# Patient Record
Sex: Male | Born: 1935 | Race: White | Hispanic: No | Smoking: Former smoker
Health system: Southern US, Community
[De-identification: ages and names within clinical notes are randomized; demographics above are authoritative.]

## PROBLEM LIST (undated history)

## (undated) DIAGNOSIS — Z0000 Encounter for general adult medical examination without abnormal findings: Secondary | ICD-10-CM

## (undated) DIAGNOSIS — I6350 Cerebral infarction due to unspecified occlusion or stenosis of unspecified cerebral artery: Secondary | ICD-10-CM

## (undated) DIAGNOSIS — E875 Hyperkalemia: Secondary | ICD-10-CM

## (undated) DIAGNOSIS — J45909 Unspecified asthma, uncomplicated: Secondary | ICD-10-CM

## (undated) DIAGNOSIS — I10 Essential (primary) hypertension: Secondary | ICD-10-CM

## (undated) DIAGNOSIS — K5900 Constipation, unspecified: Secondary | ICD-10-CM

## (undated) DIAGNOSIS — Z9189 Other specified personal risk factors, not elsewhere classified: Secondary | ICD-10-CM

## (undated) DIAGNOSIS — T7840XA Allergy, unspecified, initial encounter: Secondary | ICD-10-CM

## (undated) DIAGNOSIS — Z8619 Personal history of other infectious and parasitic diseases: Secondary | ICD-10-CM

## (undated) DIAGNOSIS — D126 Benign neoplasm of colon, unspecified: Secondary | ICD-10-CM

## (undated) DIAGNOSIS — R040 Epistaxis: Secondary | ICD-10-CM

## (undated) DIAGNOSIS — Z87891 Personal history of nicotine dependence: Secondary | ICD-10-CM

## (undated) DIAGNOSIS — E039 Hypothyroidism, unspecified: Secondary | ICD-10-CM

## (undated) DIAGNOSIS — E782 Mixed hyperlipidemia: Secondary | ICD-10-CM

## (undated) DIAGNOSIS — R5383 Other fatigue: Secondary | ICD-10-CM

## (undated) DIAGNOSIS — C4491 Basal cell carcinoma of skin, unspecified: Secondary | ICD-10-CM

## (undated) DIAGNOSIS — E663 Overweight: Secondary | ICD-10-CM

## (undated) DIAGNOSIS — E785 Hyperlipidemia, unspecified: Secondary | ICD-10-CM

## (undated) DIAGNOSIS — L989 Disorder of the skin and subcutaneous tissue, unspecified: Secondary | ICD-10-CM

## (undated) DIAGNOSIS — G629 Polyneuropathy, unspecified: Secondary | ICD-10-CM

## (undated) DIAGNOSIS — D696 Thrombocytopenia, unspecified: Secondary | ICD-10-CM

## (undated) DIAGNOSIS — D649 Anemia, unspecified: Secondary | ICD-10-CM

## (undated) DIAGNOSIS — K1370 Unspecified lesions of oral mucosa: Secondary | ICD-10-CM

## (undated) DIAGNOSIS — G4730 Sleep apnea, unspecified: Secondary | ICD-10-CM

## (undated) DIAGNOSIS — I63239 Cerebral infarction due to unspecified occlusion or stenosis of unspecified carotid arteries: Secondary | ICD-10-CM

## (undated) DIAGNOSIS — R5381 Other malaise: Secondary | ICD-10-CM

## (undated) HISTORY — DX: Unspecified asthma, uncomplicated: J45.909

## (undated) HISTORY — DX: Hyperlipidemia, unspecified: E78.5

## (undated) HISTORY — DX: Other specified personal risk factors, not elsewhere classified: Z91.89

## (undated) HISTORY — DX: Benign neoplasm of colon, unspecified: D12.6

## (undated) HISTORY — DX: Personal history of other infectious and parasitic diseases: Z86.19

## (undated) HISTORY — DX: Essential (primary) hypertension: I10

## (undated) HISTORY — DX: Hyperkalemia: E87.5

## (undated) HISTORY — DX: Other malaise: R53.81

## (undated) HISTORY — PX: CATARACT EXTRACTION, BILATERAL: SHX1313

## (undated) HISTORY — DX: Hypothyroidism, unspecified: E03.9

## (undated) HISTORY — DX: Overweight: E66.3

## (undated) HISTORY — DX: Cerebral infarction due to unspecified occlusion or stenosis of unspecified cerebral artery: I63.50

## (undated) HISTORY — DX: Polyneuropathy, unspecified: G62.9

## (undated) HISTORY — PX: TONSILLECTOMY: SUR1361

## (undated) HISTORY — DX: Sleep apnea, unspecified: G47.30

## (undated) HISTORY — DX: Anemia, unspecified: D64.9

## (undated) HISTORY — PX: ROTATOR CUFF REPAIR: SHX139

## (undated) HISTORY — DX: Disorder of the skin and subcutaneous tissue, unspecified: L98.9

## (undated) HISTORY — DX: Mixed hyperlipidemia: E78.2

## (undated) HISTORY — DX: Personal history of nicotine dependence: Z87.891

## (undated) HISTORY — DX: Basal cell carcinoma of skin, unspecified: C44.91

## (undated) HISTORY — DX: Other fatigue: R53.83

## (undated) HISTORY — DX: Epistaxis: R04.0

## (undated) HISTORY — PX: OTHER SURGICAL HISTORY: SHX169

## (undated) HISTORY — DX: Encounter for general adult medical examination without abnormal findings: Z00.00

## (undated) HISTORY — DX: Unspecified lesions of oral mucosa: K13.70

## (undated) HISTORY — DX: Constipation, unspecified: K59.00

## (undated) HISTORY — DX: Allergy, unspecified, initial encounter: T78.40XA

## (undated) HISTORY — DX: Cerebral infarction due to unspecified occlusion or stenosis of unspecified carotid artery: I63.239

## (undated) HISTORY — DX: Thrombocytopenia, unspecified: D69.6

---

## 1966-07-20 HISTORY — PX: PILONIDAL CYST EXCISION: SHX744

## 2001-05-26 ENCOUNTER — Encounter: Payer: Self-pay | Admitting: Emergency Medicine

## 2001-05-26 ENCOUNTER — Emergency Department (HOSPITAL_COMMUNITY): Admission: EM | Admit: 2001-05-26 | Discharge: 2001-05-26 | Payer: Self-pay | Admitting: Emergency Medicine

## 2007-05-04 ENCOUNTER — Ambulatory Visit: Payer: Self-pay | Admitting: Vascular Surgery

## 2008-02-08 ENCOUNTER — Encounter: Admission: RE | Admit: 2008-02-08 | Discharge: 2008-02-08 | Payer: Self-pay | Admitting: Specialist

## 2008-02-10 ENCOUNTER — Ambulatory Visit (HOSPITAL_BASED_OUTPATIENT_CLINIC_OR_DEPARTMENT_OTHER): Admission: RE | Admit: 2008-02-10 | Discharge: 2008-02-10 | Payer: Self-pay | Admitting: Specialist

## 2010-03-10 ENCOUNTER — Inpatient Hospital Stay (HOSPITAL_COMMUNITY): Admission: EM | Admit: 2010-03-10 | Discharge: 2010-03-12 | Payer: Self-pay | Admitting: Neurology

## 2010-03-10 ENCOUNTER — Encounter: Payer: Self-pay | Admitting: Family Medicine

## 2010-03-10 ENCOUNTER — Encounter (INDEPENDENT_AMBULATORY_CARE_PROVIDER_SITE_OTHER): Payer: Self-pay | Admitting: Neurology

## 2010-03-11 ENCOUNTER — Encounter (INDEPENDENT_AMBULATORY_CARE_PROVIDER_SITE_OTHER): Payer: Self-pay | Admitting: Neurology

## 2010-03-11 ENCOUNTER — Encounter: Payer: Self-pay | Admitting: Family Medicine

## 2010-03-11 ENCOUNTER — Telehealth: Payer: Self-pay | Admitting: Family Medicine

## 2010-03-17 ENCOUNTER — Ambulatory Visit: Payer: Self-pay | Admitting: Family Medicine

## 2010-03-17 DIAGNOSIS — Z87891 Personal history of nicotine dependence: Secondary | ICD-10-CM

## 2010-03-17 DIAGNOSIS — I6350 Cerebral infarction due to unspecified occlusion or stenosis of unspecified cerebral artery: Secondary | ICD-10-CM

## 2010-03-17 DIAGNOSIS — Z8619 Personal history of other infectious and parasitic diseases: Secondary | ICD-10-CM

## 2010-03-17 DIAGNOSIS — J45909 Unspecified asthma, uncomplicated: Secondary | ICD-10-CM | POA: Insufficient documentation

## 2010-03-17 DIAGNOSIS — R5381 Other malaise: Secondary | ICD-10-CM

## 2010-03-17 DIAGNOSIS — Z9189 Other specified personal risk factors, not elsewhere classified: Secondary | ICD-10-CM | POA: Insufficient documentation

## 2010-03-17 DIAGNOSIS — J309 Allergic rhinitis, unspecified: Secondary | ICD-10-CM | POA: Insufficient documentation

## 2010-03-17 DIAGNOSIS — I63239 Cerebral infarction due to unspecified occlusion or stenosis of unspecified carotid arteries: Secondary | ICD-10-CM

## 2010-03-17 DIAGNOSIS — Z8673 Personal history of transient ischemic attack (TIA), and cerebral infarction without residual deficits: Secondary | ICD-10-CM | POA: Insufficient documentation

## 2010-03-17 DIAGNOSIS — I10 Essential (primary) hypertension: Secondary | ICD-10-CM

## 2010-03-17 DIAGNOSIS — E782 Mixed hyperlipidemia: Secondary | ICD-10-CM

## 2010-03-17 DIAGNOSIS — R5383 Other fatigue: Secondary | ICD-10-CM | POA: Insufficient documentation

## 2010-03-17 HISTORY — DX: Other malaise: R53.81

## 2010-03-17 HISTORY — DX: Personal history of other infectious and parasitic diseases: Z86.19

## 2010-03-17 HISTORY — DX: Personal history of nicotine dependence: Z87.891

## 2010-03-17 HISTORY — DX: Cerebral infarction due to unspecified occlusion or stenosis of unspecified cerebral artery: I63.50

## 2010-03-17 HISTORY — DX: Unspecified asthma, uncomplicated: J45.909

## 2010-03-17 HISTORY — DX: Mixed hyperlipidemia: E78.2

## 2010-03-17 HISTORY — DX: Cerebral infarction due to unspecified occlusion or stenosis of unspecified carotid artery: I63.239

## 2010-03-17 HISTORY — DX: Other specified personal risk factors, not elsewhere classified: Z91.89

## 2010-03-19 ENCOUNTER — Telehealth (INDEPENDENT_AMBULATORY_CARE_PROVIDER_SITE_OTHER): Payer: Self-pay | Admitting: *Deleted

## 2010-03-20 ENCOUNTER — Telehealth (INDEPENDENT_AMBULATORY_CARE_PROVIDER_SITE_OTHER): Payer: Self-pay | Admitting: *Deleted

## 2010-03-21 ENCOUNTER — Telehealth (INDEPENDENT_AMBULATORY_CARE_PROVIDER_SITE_OTHER): Payer: Self-pay | Admitting: *Deleted

## 2010-03-25 ENCOUNTER — Ambulatory Visit: Payer: Self-pay | Admitting: Family Medicine

## 2010-04-07 ENCOUNTER — Ambulatory Visit: Payer: Self-pay | Admitting: Family Medicine

## 2010-04-08 LAB — CONVERTED CEMR LAB
GFR calc non Af Amer: 62.44 mL/min (ref >60)
Potassium: 4.9 meq/L (ref 3.5–5.1)
Sodium: 139 meq/L (ref 135–145)

## 2010-04-14 ENCOUNTER — Ambulatory Visit: Payer: Self-pay | Admitting: Family Medicine

## 2010-05-12 ENCOUNTER — Encounter: Payer: Self-pay | Admitting: Family Medicine

## 2010-05-12 ENCOUNTER — Telehealth: Payer: Self-pay | Admitting: Family Medicine

## 2010-05-20 ENCOUNTER — Ambulatory Visit: Payer: Self-pay | Admitting: Family Medicine

## 2010-05-21 LAB — CONVERTED CEMR LAB
ALT: 18 units/L (ref 0–53)
AST: 21 units/L (ref 0–37)
Basophils Absolute: 0.1 10*3/uL (ref 0.0–0.1)
Calcium: 10.1 mg/dL (ref 8.4–10.5)
Cholesterol: 200 mg/dL (ref 0–200)
Eosinophils Relative: 4.8 % (ref 0.0–5.0)
GFR calc non Af Amer: 55.48 mL/min (ref >60)
HCT: 43.5 % (ref 39.0–52.0)
HDL: 46 mg/dL (ref >39.00)
Hemoglobin: 14.8 g/dL (ref 13.0–17.0)
Lymphocytes Relative: 26.5 % (ref 12.0–46.0)
Lymphs Abs: 2 10*3/uL (ref 0.7–4.0)
Monocytes Relative: 9.1 % (ref 3.0–12.0)
Neutro Abs: 4.4 10*3/uL (ref 1.4–7.7)
Platelets: 204 10*3/uL (ref 150.0–400.0)
Potassium: 5.9 meq/L — ABNORMAL HIGH (ref 3.5–5.1)
Sodium: 134 meq/L — ABNORMAL LOW (ref 135–145)
Total Protein: 7.6 g/dL (ref 6.0–8.3)
Triglycerides: 154 mg/dL — ABNORMAL HIGH (ref 0.0–149.0)
VLDL: 30.8 mg/dL (ref 0.0–40.0)
WBC: 7.5 10*3/uL (ref 4.5–10.5)

## 2010-05-27 ENCOUNTER — Ambulatory Visit: Payer: Self-pay | Admitting: Family Medicine

## 2010-05-27 DIAGNOSIS — E875 Hyperkalemia: Secondary | ICD-10-CM | POA: Insufficient documentation

## 2010-05-27 HISTORY — DX: Hyperkalemia: E87.5

## 2010-05-28 ENCOUNTER — Telehealth: Payer: Self-pay | Admitting: Family Medicine

## 2010-05-28 LAB — CONVERTED CEMR LAB
BUN: 25 mg/dL — ABNORMAL HIGH (ref 6–23)
CO2: 29 meq/L (ref 19–32)
Calcium: 9.7 mg/dL (ref 8.4–10.5)
Chloride: 98 meq/L (ref 96–112)
Creatinine, Ser: 1.2 mg/dL (ref 0.4–1.5)
GFR calc non Af Amer: 65.53 mL/min (ref >60)
Glucose, Bld: 91 mg/dL (ref 70–99)
Potassium: 5.5 meq/L — ABNORMAL HIGH (ref 3.5–5.1)
Sodium: 135 meq/L (ref 135–145)

## 2010-05-29 ENCOUNTER — Encounter: Payer: Self-pay | Admitting: Family Medicine

## 2010-06-04 ENCOUNTER — Ambulatory Visit: Payer: Self-pay | Admitting: Family Medicine

## 2010-06-05 LAB — CONVERTED CEMR LAB
Albumin: 4 g/dL (ref 3.5–5.2)
BUN: 19 mg/dL (ref 6–23)
Calcium: 9.3 mg/dL (ref 8.4–10.5)
Chloride: 100 meq/L (ref 96–112)
Phosphorus: 3 mg/dL (ref 2.3–4.6)
Potassium: 4.6 meq/L (ref 3.5–5.1)

## 2010-06-10 ENCOUNTER — Telehealth: Payer: Self-pay | Admitting: Family Medicine

## 2010-07-01 ENCOUNTER — Ambulatory Visit: Payer: Self-pay | Admitting: Family Medicine

## 2010-07-01 DIAGNOSIS — E663 Overweight: Secondary | ICD-10-CM

## 2010-07-01 HISTORY — DX: Overweight: E66.3

## 2010-07-16 ENCOUNTER — Telehealth (INDEPENDENT_AMBULATORY_CARE_PROVIDER_SITE_OTHER): Payer: Self-pay | Admitting: *Deleted

## 2010-07-30 ENCOUNTER — Encounter: Payer: Self-pay | Admitting: Family Medicine

## 2010-08-10 ENCOUNTER — Encounter: Payer: Self-pay | Admitting: Neurology

## 2010-08-14 ENCOUNTER — Encounter: Payer: Self-pay | Admitting: Family Medicine

## 2010-08-19 NOTE — Progress Notes (Signed)
Summary: BP  Phone Note Call from Patient   Summary of Call: BP at 6:00 am before meds was 147/74, BP at 9:30 after meds was 132/64, BP at 11 139/70. Please advise? Initial call taken by: Josph Macho RMA,  March 21, 2010 11:18 AM  Follow-up for Phone Call        Take 10 mg by mouth once daily over the weekend and call with numbers on Tuesday. If he is anxious we can call in 5 mg tabs to take 1 two times a day, #60, 0RF and then he can always use 2 at a time if we decide he should take 10mg  per dose. Again that just spaces out the dosing so it hits him more evenly over a 24 hour window. Either is fine with me Follow-up by: Danise Edge MD,  March 21, 2010 11:26 AM  Additional Follow-up for Phone Call Additional follow up Details #1::        pt informed Additional Follow-up by: Josph Macho RMA,  March 21, 2010 11:46 AM

## 2010-08-19 NOTE — Progress Notes (Signed)
Summary: CVA  Phone Note From Other Clinic   Caller: [facility] Call For: Dr. Scotty Court Summary of Call: Pt is in room 3023 with CVA Initial call taken by: [place] CMA,  March 11, 2010 9:16 AM  Follow-up for Phone Call        will attempt tocall Follow-up by: Judithann Sheen MD,  March 12, 2010 9:29 AM

## 2010-08-19 NOTE — Assessment & Plan Note (Signed)
Summary: 2 MONTH ROV/NJR   Vital Signs:  Patient profile:   76 year old male Height:      74 inches (187.96 cm) Weight:      205 pounds (93.18 kg) O2 Sat:      99 % on Room air Temp:     97.7 degrees F (36.50 degrees C) oral Pulse rate:   70 / minute BP sitting:   140 / 80  (left arm) Cuff size:   large  Vitals Entered By: Josph Macho RMA (May 27, 2010 8:12 AM)  O2 Flow:  Room air CC: 2 month follow up/ pt states ever since he started taking Maxzide he feels really tired/ CF Is Patient Diabetic? No   History of Present Illness: patient is a 76 year old Caucasian male in today for followup on hypertension. Nonsmoker. Has recently seen his neurologist and an acid feeling and iris normal, insulin resistant intervention after stroke or TIA with a [employer] of neurologic disorders and stroke. He is inclined to during this study. She has not had any chest pain, headaches, neurologic concerns since last seen. Has had 3 episodes of feeling more tired the usual over the last 3 weeks. His most notable episode was 2 days ago and he was standing in church for long period of time and singing he felt very tired had a very fleeting episode of nausea and feeling hot and then the symptoms resolved. He did not have any palpitations, shortness of breath, chest pain at that time didn't feel more tired for several hours before this resolved. Yesterday he felt well. This morning he woke up feeling tired again not syncopal or presyncopal did not have any recurrence of the nausea or sweatiness. He had one similar episode a couple weeks ago as well with just mild fatigue without associated nausea. He is drinking some less as he swells up clear fluid a day and does drink a glass of wine in some caffeine on most days. No GI or GU concerns.  Current Medications (verified): 1)  Plavix 75 Mg Tabs (Clopidogrel Bisulfate) .... Daily With A Meal 2)  Simvastatin 40 Mg Tabs (Simvastatin) .... Once Daily 3)   Fluocinonide 0.05 % Gel (Fluocinonide) .... Daily 4)  Ramipril 10 Mg Caps (Ramipril) .[name].. 1 Cap By Mouth Two Times A Day 5)  Maxzide-25 37.5-25 Mg Tabs (Triamterene-Hctz) .[name].. 1 Tab By Mouth Daily  Allergies (verified): No Known Drug Allergies  Past History:  Past medical history reviewed for relevance to current acute and chronic problems. Social history (including risk factors) reviewed for relevance to current acute and chronic problems.  Social History: Reviewed history from 03/17/2010 and no changes required. Retired from 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 No apparent alopecia or balding. Mouth:  Oral mucosa and oropharynx without lesions or exudates.  dry mucus membranes Neck:  No deformities, masses, or tenderness noted. Lungs:  Normal respiratory effort, chest expands symmetrically. Lungs are clear to auscultation, no crackles or wheezes. Heart:  Normal rate and regular rhythm. S1 and S2 normal  without gallop, murmur, click, rub or other extra sounds. Abdomen:  Bowel sounds positive,abdomen soft and non-tender without masses, organomegaly or hernias noted.no guarding.   Extremities:  No clubbing, cyanosis, edema, or deformity noted with normal full range of motion of all joints.   Cervical Nodes:  No lymphadenopathy noted Psych:  Cognition and judgment appear intact. Alert and cooperative with normal attention span and concentration. No apparent delusions, illusions,  hallucinations   Impression & Recommendations:  Problem # 1:  ESSENTIAL HYPERTENSION, BENIGN (ICD-401.1)  The following medications were removed from the medication list:    Maxzide-25 37.5-25 Mg Tabs (Triamterene-hctz) .[name]... 1 tab by mouth daily His updated medication list for this problem includes:    Ramipril 10 Mg Caps (Ramipril) .[name]... 1 cap by mouth two times a day    Chlorthalidone 25 Mg Tabs (Chlorthalidone) .[name]... 1 tab by mouth daily  Problem # 2:  MIXED HYPERLIPIDEMIA (ICD-272.2)  His updated medication list for this problem includes:    Simvastatin 40 Mg Tabs (Simvastatin) ..... Once daily Add Fish Oil 2 caps by mouth ddaily, avoid trans fats and repeat FLP in 3 months  Problem # 3:  HYPERKALEMIA (ICD-276.7)  Orders: TLB-BMP (Basic Metabolic Panel-BMET) (80048-METABOL) Venipuncture ([address]) Specimen Handling ([address]) Await results, reeval at next visit  Problem # 4:  CEREBROVASCULAR ACCIDENT (ICD-434.91)  The following medications were removed from the medication list:    Aspirin 81 Mg Tbec (Aspirin) ..... Once daily His updated medication list for this problem includes:    Plavix 75 Mg Tabs (Clopidogrel bisulfate) .[name]... Daily with a meal Doing well, will join the Iris trial with [facility]  Complete Medication List: 1)  Plavix 75 Mg Tabs (Clopidogrel bisulfate) .... Daily with a meal 2)  Simvastatin 40 Mg Tabs (Simvastatin) .... Once daily 3)  Fluocinonide 0.05 % Gel (Fluocinonide) .... Daily 4)  Ramipril 10 Mg Caps (Ramipril) .[name].. 1 cap by mouth two times a day 5)  Chlorthalidone 25 Mg Tabs (Chlorthalidone) .[name].. 1 tab by mouth daily  Patient Instructions: 1)  renal Panel prior to visit ICD-9: 401.1, in 1  mn 2)  Please schedule a follow-up appointment in 1-2 month. 3)  Remember 85 osz of fluids daily Prescriptions: CHLORTHALIDONE 25 MG TABS (CHLORTHALIDONE) 1 tab by mouth daily  #30 x 2   Entered and Authorized by:   Danise Edge MD   Signed by:   Danise Edge MD  on 05/27/2010   Method used:   Electronically to        [facility]* (retail)       [address] [place], [place]  [address]       Ph: [phone]       Fax: [phone]   RxID:   [id number]    Orders Added: 1)  TLB-BMP (Basic Metabolic Panel-BMET) [80048-METABOL] 2)  Venipuncture [27253] 3)  Specimen Handling [99000] 4)  Est. Patient Level IV [66440]

## 2010-08-19 NOTE — Progress Notes (Signed)
Summary: Simvastatin RX  Phone Note Refill Request Message from:  Fax from Pharmacy on June 10, 2010 9:55 AM  Refills Requested: Medication #1:  SIMVASTATIN 40 MG TABS once daily   Dosage confirmed as above?Dosage Confirmed Initial call taken by: Josph Macho RMA,  June 10, 2010 9:55 AM    Prescriptions: SIMVASTATIN 40 MG TABS (SIMVASTATIN) once daily  #30 x 2   Entered by:   Josph Macho RMA   Authorized by:   Danise Edge MD   Signed by:   Josph Macho RMA on 06/10/2010   Method used:   Electronically to        [facility]* (retail)       [address] [place], [place]  [address]       Ph: [phone]       Fax: [phone]   RxID:   [id number]

## 2010-08-19 NOTE — Assessment & Plan Note (Signed)
Summary: 1 week fup//ccm rsc appt time/njr   Vital Signs:  Patient profile:   76 year old male Height:      74 inches (187.96 cm) Weight:      214 pounds (97.27 kg) O2 Sat:      97 % on Room air Temp:     97.8 degrees F (36.56 degrees C) oral Pulse rate:   65 / minute BP sitting:   162 / 96  (left arm) BP standing:   152 / 90 Cuff size:   regular  Vitals Entered By: Josph Macho RMA (March 25, 2010 11:10 AM)  O2 Flow:  Room air  Serial Vital Signs/Assessments:  Time      Position  BP       Pulse  Resp  Temp     By                     148/86                         Danise Edge MD                     150/88                         Danise Edge MD  CC: 1 week follow up/ CF Is Patient Diabetic? No   History of Present Illness: Patient in today for reevaluation of his BP. He brings in a log of numbers taken at home on his automatic machine which actually look pretty good systolics generally in the 130s and 140s, diastolics in the 80s to 100. But then when we use his machine here it shows that the battery is low and reads in the 160s to 180s systolic and 80s to 90s diastolic. He feels well. No f/c/HA/new neuro c/o. Still has the small blind spot in left lateral field of vision with an occasional flash of light. Not worsening. No CP/palp/SOB/GI or GU c/o.  He is avoiding sodium and has not been exercisig intensely until we get his pressure down.  Current Medications (verified): 1)  Aspirin 81 Mg Tbec (Aspirin) .... Once Daily 2)  Plavix 75 Mg Tabs (Clopidogrel Bisulfate) .... Daily With A Meal 3)  Simvastatin 40 Mg Tabs (Simvastatin) .... Once Daily 4)  Fluocinonide 0.05 % Gel (Fluocinonide) .... Daily 5)  Ramipril 10 Mg Caps (Ramipril) .[name].. 1 Cap By Mouth Q Day  Allergies (verified): No Known Drug Allergies  Past History:  Past medical history reviewed for relevance to current acute and chronic problems. Social history (including risk factors) reviewed for relevance to  current acute and chronic problems.  Social History: Reviewed history from 03/17/2010 and no changes required. Retired from 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 No apparent alopecia or balding. Eyes:  No corneal or conjunctival inflammation noted. EOMI. Perrla. Funduscopic exam benign, without hemorrhages, exudates or papilledema. Vision grossly normal. Neck:  No deformities, masses, or tenderness  noted. Lungs:  Normal respiratory effort, chest expands symmetrically. Lungs are clear to auscultation, no crackles or wheezes. Heart:  Normal rate and regular rhythm. S1 and S2 normal without gallopr, click, rub or other extra sounds. Abdomen:  Bowel sounds positive,abdomen soft and non-tender without masses, organomegaly or hernias noted. Extremities:  No clubbing, cyanosis, edema, or deformity noted     Impression & Recommendations:  Problem # 1:  ESSENTIAL HYPERTENSION, BENIGN (ICD-401.1)  The following medications were removed from the medication list:    Ramipril 10 Mg Caps (Ramipril) .[name]... 1 cap by mouth q day His updated medication list for this problem includes:    Ramipril 5 Mg Caps (Ramipril) .[name]... 2 caps by mouth in am and 1 cap by mouth once daily in pm Reevaluate kidney function and bp next week or sooner if any concerns  Orders: Prescription Created Electronically [id number])  Problem # 2:  CEREBROVASCULAR ACCIDENT (ICD-434.91)  His updated medication list for this problem includes:     Aspirin 81 Mg Tbec (Aspirin) ..... Once daily    Plavix 75 Mg Tabs (Clopidogrel bisulfate) .[name]... Daily with a meal No new symptoms, seek immediate care if symptoms progess  Problem # 3:  MIXED HYPERLIPIDEMIA (ICD-272.2)  His updated medication list for this problem includes:    Simvastatin 40 Mg Tabs (Simvastatin) ..... Once daily Cont Simvastatin and check FLP in 2 months  Complete Medication List: 1)  Aspirin 81 Mg Tbec (Aspirin) .... Once daily 2)  Plavix 75 Mg Tabs (Clopidogrel bisulfate) .... Daily with a meal 3)  Simvastatin 40 Mg Tabs (Simvastatin) .... Once daily 4)  Fluocinonide 0.05 % Gel (Fluocinonide) .... Daily 5)  Ramipril 5 Mg Caps (Ramipril) .... 2 caps by mouth in am and 1 cap by mouth once daily in pm  Patient Instructions: 1)  Please schedule a follow-up appointment in 1- 2 weeks.  2)  BMP prior to visit, ICD-9: 401.1 Prescriptions: RAMIPRIL 5 MG CAPS (RAMIPRIL) 2 caps by mouth in am and 1 cap by mouth once daily in pm  #90 x 1   Entered and Authorized by:   Danise Edge MD   Signed by:   Danise Edge MD on 03/25/2010   Method used:   Electronically to        [facility]* (retail)       [address] [place], [place]  [address]       Ph: [phone]       Fax: [phone]   RxID:   [id number]

## 2010-08-19 NOTE — Progress Notes (Signed)
  Phone Note Call from Patient   Summary of Call: BP at 8:30 158/74, 11:00 BP140/80, 3:00 BP 130/67. Please advise Ramipril 2.5mg  2 in morning and 2 in afternoon. Pt has Ramipril 10mg  is it okay to start this or stay on 2.5mg ? Please advise? Initial call taken by: Josph Macho RMA,  March 20, 2010 2:54 PM  Follow-up for Phone Call        Try the 10 mg tomorrow am and call us by 11ish tomorrow and then we will decide if he needs 5s for the weekend Follow-up by: Danise Edge MD,  March 20, 2010 3:04 PM  Additional Follow-up for Phone Call Additional follow up Details #1::        Informed pt Additional Follow-up by: Josph Macho RMA,  March 20, 2010 3:09 PM

## 2010-08-19 NOTE — Progress Notes (Signed)
Summary: low BP....states he was told to call if below 150/90  Phone Note Call from Patient   Caller: Patient Call For: Dr. Abner Greenspan Summary of Call: Pt's BP is 126/63, 134/54 the past 2 days. [phone] Initial call taken by: Lynann Beaver CMA,  March 19, 2010 8:52 AM  Follow-up for Phone Call        drop his Ramipril to 2.5 mg by mouth two times a day and report BP numbers to [place] on thurs or fri Follow-up by: Danise Edge MD,  March 19, 2010 9:13 AM  Additional Follow-up for Phone Call Additional follow up Details #1::        Pt informed. I asked pt to report his BP readings Thursday afternoon. Additional Follow-up by: Josph Macho RMA,  March 19, 2010 9:39 AM

## 2010-08-19 NOTE — Letter (Signed)
Summary: [facility]  [facility]   Imported By: Maryln Gottron 05/15/2010 10:33:01  _____________________________________________________________________  External Attachment:    Type:   Image     Comment:   External Document

## 2010-08-19 NOTE — Progress Notes (Signed)
Summary: BP concerns  Phone Note Call from Patient   Summary of Call: BP 165/100 when pt seen Dr Pearlean Brownie today? Please advise? Call pt back on cell at [phone]. Initial call taken by: Josph Macho RMA,  May 12, 2010 4:23 PM  Follow-up for Phone Call        OK we better add Maxzide 37.5/25 1 tab by mouth daily #30 with 1 rf til seen, check renal when he comes  in Follow-up by: Danise Edge MD,  May 12, 2010 5:05 PM  Additional Follow-up for Phone Call Additional follow up Details #1::        Pt informed. Additional Follow-up by: Josph Macho RMA,  May 12, 2010 5:13 PM    New/Updated Medications: MAXZIDE-25 37.5-25 MG TABS (TRIAMTERENE-HCTZ) 1 tab by mouth daily Prescriptions: MAXZIDE-25 37.5-25 MG TABS (TRIAMTERENE-HCTZ) 1 tab by mouth daily  #30 x 1   Entered by:   Josph Macho RMA   Authorized by:   Danise Edge MD   Signed by:   Josph Macho RMA on 05/12/2010   Method used:   Electronically to        [facility]* (retail)       [address] [place], [place]  [address]       Ph: [phone]       Fax: [phone]   RxID:   [id number]

## 2010-08-19 NOTE — Letter (Signed)
Summary: [facility]  [facility]   Imported By: Maryln Gottron 03/18/2010 15:43:14  _____________________________________________________________________  External Attachment:    Type:   Image     Comment:   External Document

## 2010-08-19 NOTE — Assessment & Plan Note (Signed)
Summary: NEW PT EST / RS   Vital Signs:  Patient profile:   76 year old male Height:      74 inches (187.96 cm) Weight:      217 pounds (98.64 kg) BMI:     27.96 O2 Sat:      97 % on Room air Temp:     98.1 degrees F (36.72 degrees C) oral Pulse rate:   68 / minute BP sitting:   182 / 98  (left arm) Cuff size:   regular  Vitals Entered By: Josph Macho RMA (March 17, 2010 8:40 AM)  O2 Flow:  Room air CC: Establish new patient/ pt states he had a small stroke last week/ CF Is Patient Diabetic? No   History of Present Illness: Patient in today for new patient appt. 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 After it improved he did note some residual loss of peripheral vision on left. This was on Sat on Monday he went to see Dr Burgess Estelle, Opthalmologist. He ran very complete tests and was concerned the loss was neurologic and he sent the patient to Dr Avie Echevaria, from [facility]. The Carotid Doppler showed occlusion of the right common carotid artery. [facility], Dr Pearlean Brownie took over the case. Had multiple tests, including MRI of Brain, CT head and neck and CT angiogram of head of neck which confirmed an acute infarct in the right side in the occipital lobe and occlusion of the right common artery. He reports BP was very labile in the hospital with hi and low numbers but the lowest numbers he saw was 144 on top and 69 on the bottom at separate times. Prior to this episode he denies being told he had Hi BP or hyperlipidemia in the past. Of note he has not been to see a PMD in many years.  Preventive Screening-Counseling & Management  Alcohol-Tobacco     Smoking Status: quit  Caffeine-Diet-Exercise     Does Patient Exercise: yes      Drug Use:  no.    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 Once Daily 2)  Plavix 75 Mg Tabs (Clopidogrel Bisulfate) .... Daily With A Meal 3)  Ramipril 2.5 Mg Caps (Ramipril) .... By Mouth Daily 4)  Simvastatin 40 Mg Tabs (Simvastatin) .... Once Daily 5)  Fluocinonide 0.05 % Gel (Fluocinonide) .... Daily  Allergies (verified): No Known Drug Allergies  Past History:  Past Surgical History: Tonsillectomy Pilonidal Cyst removed in 1968 Right knee cartilage repair 1964  Family History: Father: unknown history, died in late 10s Mother: deceased@89 , Alzheimer's dementia Siblings:  Sister: 99, emphysema, cigarettes Sister: deceased in early 40s, gyn cancer M1/2 Brother: 69, CAD, previous smoker, 1st MI around 50 MGM: deceased in early 48s, old age MGF: deceased young, maybe late 36s PGM: deceased in late 29s  PGF: deceased in late 4s Children: Son: 72, heart disease Daughter: 22, Bipolar Disorder, overweight Daughter: 31, [employer]  Social History: 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  The patient denies anorexia, fever, weight loss, weight gain, decreased hearing, hoarseness, chest pain,  syncope, dyspnea on exertion, peripheral edema, prolonged cough, headaches, hemoptysis, abdominal pain, melena, hematochezia, severe indigestion/heartburn, hematuria, incontinence, genital sores, muscle weakness, suspicious skin lesions, difficulty walking, depression, unusual weight change, abnormal bleeding, and enlarged lymph nodes.    Physical Exam  General:  Well-developed,well-nourished,in no acute distress; alert,appropriate and cooperative throughout examination Head:  Normocephalic and atraumatic without obvious abnormalities. No apparent alopecia or balding. Eyes:  No corneal or conjunctival inflammation noted. EOMI. Perrla. Funduscopic exam benign, without hemorrhages, exudates or papilledema. Vision grossly normal. Ears:  External ear exam shows no significant lesions or deformities.  Otoscopic examination reveals clear canals, tympanic membranes are intact bilaterally without bulging, retraction, inflammation or discharge. Hearing is grossly normal bilaterally. Nose:  External nasal examination shows no deformity or inflammation. Nasal mucosa are pink and moist without lesions or exudates. Mouth:  Oral mucosa and oropharynx without lesions or exudates.  Teeth in good repair. Neck:  No deformities, masses, or tenderness noted. Lungs:  Normal respiratory effort, chest expands symmetrically. Lungs are clear to auscultation, no crackles or wheezes. Heart:  Normal rate and regular rhythm. S1 and S2 normal without gallop, murmur, click, rub or other extra sounds. Abdomen:  Bowel sounds positive,abdomen soft and non-tender without masses, organomegaly or hernias noted. Msk:  No deformity or scoliosis noted of thoracic or lumbar spine.   Pulses:  R posterior tibial normal and L posterior tibial normal.  b/l carotid arteries with bruits  Extremities:  No clubbing, cyanosis, edema, or deformity noted with normal full range of motion of all joints.   Neurologic:  No cranial nerve deficits noted.  Station and gait are normal. Plantar reflexes are down-going bilaterally. DTRs are symmetrical throughout. Sensory, motor and coordinative functions appear intact. Blurring at very periopheral aspect of left field of vision Skin:  Intact without suspicious lesions or rashes Cervical Nodes:  No lymphadenopathy noted Psych:  Cognition and judgment appear intact. Alert and cooperative with normal attention span and concentration. No apparent delusions, illusions, hallucinations   Impression & Recommendations:  Problem # 1:  ESSENTIAL HYPERTENSION, BENIGN (ICD-401.1)  The following medications were removed from the medication list:    Ramipril 2.5 Mg Caps (Ramipril) ..... By mouth daily His updated medication list for this problem includes:    Ramipril 10 Mg Caps (Ramipril) .[name]... 1 cap by mouth q day Will increase his Ramipril to 5 mg two times a day initially and monitor BP if any BP under 150/90 patient is asked to call.  Check a renal panel at next visit.  Orders: Prescription Created Electronically [phone])  Problem # 2:  MIXED HYPERLIPIDEMIA (ICD-272.2)  His updated medication list for this problem includes:    Simvastatin 40 Mg Tabs (Simvastatin) ..... Once daily Continue current dose and check LFTs in next couple of weeks  Problem # 3:  CEREBROVASCULAR ACCIDENT (ICD-434.91)  His updated medication list for this problem includes:    Aspirin 81 Mg Tbec (Aspirin) ..... Once daily    Plavix 75 Mg Tabs (Clopidogrel bisulfate) .[name]... Daily with a meal Has appt with Neuro soon, will cont with Dr Pearlean Brownie  Complete Medication List: 1)  Aspirin 81 Mg Tbec (Aspirin) .... Once daily 2)  Plavix 75 Mg Tabs (Clopidogrel bisulfate) .... Daily with a meal 3)  Simvastatin 40 Mg Tabs (Simvastatin) .... Once daily 4)  Fluocinonide 0.05 % Gel (Fluocinonide) .... Daily 5)  Ramipril 10 Mg Caps (Ramipril) .[name].. 1 cap by mouth q day  Patient Instructions: 1)  Please schedule a follow-up appointment in  1 weeks.  2)  Limit your Sodium(salt) .  3)  Avoid heavy consumption of sodium, caffeine. Make sure to get 7-8 hours of sleep nightly and maintain only a low impact walking regimen until we get better BP control 4)  renal, lft at next visit, 401.1 Prescriptions: RAMIPRIL 10 MG CAPS (RAMIPRIL) 1 cap by mouth q day  #30 x 1   Entered and Authorized by:   Danise Edge MD   Signed by:   Danise Edge MD on 03/17/2010   Method used:   Electronically to        [facility] (retail)       [address]       [place], [place]  [address]       Ph: [phone]       Fax: [phone]   RxID:   [id number]   Preventive Care Screening  Last Flu Shot:    Date:  04/19/2009    Results:  historical   Last Pneumovax:    Date:  07/21/2007    Results:  historical   Last Tetanus Booster:    Date:  07/21/2003    Results:  Historical

## 2010-08-19 NOTE — Assessment & Plan Note (Signed)
Summary: 3 wk rov/njr   Vital Signs:  Patient profile:   76 year old male Height:      74 inches (187.96 cm) Weight:      209 pounds (95.00 kg) O2 Sat:      98 % on Room air Temp:     98.0 degrees F (36.67 degrees C) oral Pulse rate:   62 / minute BP sitting:   170 / 92  (left arm) Cuff size:   regular  Vitals Entered By: Josph Macho RMA (April 14, 2010 9:36 AM)  O2 Flow:  Room air CC: 3 week follow up/ Patient has readings of BP/ CF Is Patient Diabetic? No   History of Present Illness: Patient in today for reevaluation of HTN. he reports he still feels well. He has minimal but unchanged headaches these are not frequent he uses Tylenol for the recurrent. He denies chest pain, palpitations, shortness of breath, fevers, chills, GU complaints. Does have occasional heartburn less than once weekly that he take famotidine. He has a good response with no edema he does take it. His bowels are moving comfortablyno bloody tarry stool no nausea vomiting anorexia or other concerns are noted today. He does have an appointment with neurology in a couple weeks to further evaluate his recent cerebrovascular accident. No signs of recurrence at this time   Current Medications (verified): 1)  Aspirin 81 Mg Tbec (Aspirin) .... Once Daily 2)  Plavix 75 Mg Tabs (Clopidogrel Bisulfate) .... Daily With A Meal 3)  Simvastatin 40 Mg Tabs (Simvastatin) .... Once Daily 4)  Fluocinonide 0.05 % Gel (Fluocinonide) .... Daily 5)  Ramipril 5 Mg Caps (Ramipril) .... 2 Caps By Mouth in Am and 1 Cap By Mouth Once Daily in Pm  Allergies (verified): No Known Drug Allergies  Past History:  Past medical history reviewed for relevance to current acute and chronic problems. Social history (including risk factors) reviewed for relevance to current acute and chronic problems.  Social History: Reviewed history from 03/17/2010 and no changes required. Retired from 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 No apparent alopecia or balding. Mouth:  Oral mucosa and oropharynx without lesions or exudates.  Teeth in good repair. Neck:  No deformities, masses, or tenderness noted. Lungs:  Normal respiratory effort, chest expands symmetrically. Lungs are clear to auscultation, no crackles or wheezes. Heart:  Normal rate and regular rhythm. S1 and S2 normal without gallop, murmur, click, rub or other extra sounds. Abdomen:  Bowel sounds positive,abdomen soft and non-tender without masses, organomegaly or hernias noted. Extremities:  No clubbing, cyanosis, edema, or deformity noted  Psych:  Cognition and judgment appear intact. Alert and cooperative with normal attention span and concentration. No apparent delusions, illusions, hallucinations   Impression & Recommendations:  Problem # 1:  ESSENTIAL HYPERTENSION, BENIGN (ICD-401.1)  The following medications were removed from the medication list:    Ramipril 5 Mg Caps (Ramipril) .[name]... 2 caps by mouth in am and 1 cap by mouth once daily in pm His updated medication list for this problem  includes:    Ramipril 10 Mg Caps (Ramipril) .[name]... 1 cap by mouth two times a day Avoid hi doses of caffeine and sodium  Problem # 2:  MIXED HYPERLIPIDEMIA (ICD-272.2)  His updated medication list for this problem includes:    Simvastatin 40 Mg Tabs (Simvastatin) ..... Once daily Check a FLP in 2 months  Problem # 3:  FATIGUE (ICD-780.79) Continue vit B complex, check CBC at next  visit  Problem # 4:  CEREBROVASCULAR ACCIDENT (ICD-434.91)  His updated medication list for this problem includes:    Aspirin 81 Mg Tbec (Aspirin) ..... Once daily    Plavix 75 Mg Tabs (Clopidogrel bisulfate) .[name]... Daily with a meal Has appt with neurology next month  Complete Medication List: 1)  Aspirin 81 Mg Tbec (Aspirin) .... Once daily 2)  Plavix 75 Mg Tabs (Clopidogrel bisulfate) .... Daily with a meal 3)  Simvastatin 40 Mg Tabs (Simvastatin) .... Once daily 4)  Fluocinonide 0.05 % Gel (Fluocinonide) .... Daily 5)  Ramipril 10 Mg Caps (Ramipril) .[name].. 1 cap by mouth two times a day  Patient Instructions: 1)  Please schedule a follow-up appointment in 2 month.  2)  Limit your Sodium(salt) .  3)  BMP prior to visit, ICD-9: 401.1 4)  try Mag every other day and Vit D every other day 5)  Vit C hold if GI upset 6)  Hepatic Panel prior to visit ICD-9: 401.1 7)  Lipid panel prior to visit ICD-9 : 272.4 8)  CBC w/ Diff prior to visit ICD-9 : 780.79 Prescriptions: PLAVIX 75 MG TABS (CLOPIDOGREL BISULFATE) daily with a meal  #30 x 2   Entered and Authorized by:   Danise Edge MD   Signed by:   Danise Edge MD on 04/14/2010   Method used:   Electronically to        [facility]* (retail)       [address] [place], [place]  [address]       Ph: [phone]       Fax: [phone]   RxID:   [id number] RAMIPRIL 10 MG CAPS (RAMIPRIL) 1 cap by mouth two times a day  #60 x 1   Entered and Authorized by:   Danise Edge MD   Signed by:   Danise Edge MD on 04/14/2010   Method used:   Electronically to        [facility]* (retail)       [address] [place], [place]  [address]       Ph: [phone]       Fax: [phone]   RxID:   [id number]

## 2010-08-19 NOTE — Progress Notes (Signed)
Summary: new med  Phone Note Refill Request Call back at Work Phone [phone] Message from:  Patient on [employer] [facility]  Refills Requested: Medication #1:  CHLORTHALIDONE 25 MG TABS 1 tab by mouth daily. pt has not received new med. Please call pharm  Initial call taken by: Heron Sabins,  May 28, 2010 10:53 AM    Prescriptions: CHLORTHALIDONE 25 MG TABS (CHLORTHALIDONE) 1 tab by mouth daily  #30 x 2   Entered by:   Josph Macho RMA   Authorized by:   Danise Edge MD   Signed by:   Josph Macho RMA on 05/28/2010   Method used:   Electronically to        [facility]* (retail)       [address] [place], [place]  [address]       Ph: [phone]       Fax: [phone]   RxID:   [id number]

## 2010-08-21 NOTE — Consult Note (Signed)
Summary: [facility]  [facility]   Imported By: Lester [place] 07/10/2010 09:54:06  _____________________________________________________________________  External Attachment:    Type:   Image     Comment:   External Document

## 2010-08-21 NOTE — Letter (Signed)
Summary: Insulin Resistance Intervention after Stroke Trial  Insulin Resistance Intervention after Stroke Trial   Imported By: Lester [place]in[place] 07/03/2010 07:52:40  _____________________________________________________________________  External Attachment:    Type:   Image     Comment:   External Document

## 2010-08-21 NOTE — Assessment & Plan Note (Signed)
Summary: fup//ccm/pt rescd to [facility]//ccm   Vital Signs:  Patient profile:   76 year old male Height:      74 inches (187.96 cm) Weight:      200.25 pounds (91.02 kg) O2 Sat:      100 % on Room air Temp:     97.6 degrees F (36.44 degrees C) oral Pulse rate:   62 / minute BP sitting:   152 / 82  (right arm) Cuff size:   regular  Vitals Entered By: Josph Macho RMA (July 01, 2010 8:14 AM)  O2 Flow:  Room air  Serial Vital Signs/Assessments:  Time      Position  BP       Pulse  Resp  Temp     By                     148/80                         Danise Edge MD  CC: Follow-up visit/ CF Is Patient Diabetic? No   History of Present Illness: Patient is a 76 yo Caucasian male in today for reevaluation of his hypertension. He is feeling well. He has an appt tomorrow for a carotid artery. He is participating in study on insulin resistance. The trial is named IRIS: Insulin Resistance Intervention after Stroke Trial. No recent illness, f,c CP, palp, SOB, GI or GU c/o. He is drinking Hibiscus tea daily because he had heard it helps decrease BP. Has been taking Acetaminophen as needed for HA it works but not as well as aspirin did. He notes when he first started Chlorthalidone he had a period fatigue an hour after taking it but that has improved.   Current Medications (verified): 1)  Plavix 75 Mg Tabs (Clopidogrel Bisulfate) .... Daily With A Meal 2)  Simvastatin 40 Mg Tabs (Simvastatin) .... Once Daily 3)  Fluocinonide 0.05 % Gel (Fluocinonide) .... Daily 4)  Ramipril 10 Mg Caps (Ramipril) .[name].. 1 Cap By Mouth Two Times A Day 5)  Chlorthalidone 25 Mg Tabs (Chlorthalidone) .[name].. 1 Tab By Mouth Daily  Allergies (verified): No Known Drug Allergies  Past History:  Past medical history reviewed for relevance to current acute and chronic problems. Social history (including risk factors) reviewed for relevance to current acute and chronic problems.  Social History: Reviewed history  from 03/17/2010 and no changes required. Retired from 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 No apparent alopecia or balding. Mouth:  Oral mucosa and oropharynx without lesions or exudates.  Teeth in good repair. Neck:  No deformities, masses, or tenderness noted. Lungs:  Normal respiratory effort, chest expands symmetrically. Lungs are clear to auscultation, no crackles or wheezes. Heart:  Normal rate and regular rhythm. S1 and S2 normal without gallop, murmur, click, rub or other extra sounds. Abdomen:  Bowel sounds positive,abdomen soft and non-tender without masses, organomegaly or hernias noted. Extremities:  No clubbing, cyanosis, edema, or deformity noted  Psych:  Cognition and judgment appear  intact. Alert and cooperative with normal attention span and concentration. No apparent delusions, illusions, hallucinations   Impression & Recommendations:  Problem # 1:  ESSENTIAL HYPERTENSION, BENIGN (ICD-401.1)  His updated medication list for this problem includes:    Ramipril 10 Mg Caps (Ramipril) .[name]... 1 cap by mouth two times a day    Chlorthalidone 25 Mg Tabs (Chlorthalidone) .[name]... 1 tab by mouth daily  Problem # 2:  MIXED HYPERLIPIDEMIA (ICD-272.2)  His updated medication list for this problem includes:    Simvastatin 40 Mg Tabs (Simvastatin) ..... Once daily Tolerating meds, avoid trans fats  Problem # 3:  CAROTID ARTERY OCCLUSION, WITH INFARCTION (ICD-433.11)  His updated medication list for  this problem includes:    Plavix 75 Mg Tabs (Clopidogrel bisulfate) .[name]... Daily with a meal Proceed with doppler studies tomorrow  Problem # 4:  OVERWEIGHT (ICD-278.02) Patient has actively been watching his weight and has lost almost 5 pounds since his last visit he is encouraged to increase his activity level and continue his decreased caloric intake.  Problem # 5:  CEREBROVASCULAR ACCIDENT (ICD-434.91)  His updated medication list for this problem includes:    Plavix 75 Mg Tabs (Clopidogrel bisulfate) .[name]... Daily with a meal No further incidents has enrolled in a clinical trial to evaluate insulin resistance after a stroke. Is tolerating the blinded tablet daily  Complete Medication List: 1)  Plavix 75 Mg Tabs (Clopidogrel bisulfate) .... Daily with a meal 2)  Simvastatin 40 Mg Tabs (Simvastatin) .... Once daily 3)  Fluocinonide 0.05 % Gel (Fluocinonide) .... Daily 4)  Ramipril 10 Mg Caps (Ramipril) .[name].. 1 cap by mouth two times a day 5)  Chlorthalidone 25 Mg Tabs (Chlorthalidone) .[name].. 1 tab by mouth daily  Patient Instructions: 1)  Please schedule a follow-up appointment in 2 months.  2)  Minimize sodium 3)  Increase exercise 4)  Enjoy Holidays 5)  Call with any concerns   Orders Added: 1)  Est. Patient Level IV [[address]]

## 2010-08-21 NOTE — Progress Notes (Signed)
Summary: Ramipril refill  Phone Note Refill Request Message from:  Fax from Pharmacy on July 16, 2010 1:33 PM  Refills Requested: Medication #1:  RAMIPRIL 10 MG CAPS 1 cap by mouth two times a day   Dosage confirmed as above?Dosage Confirmed   Brand Name Necessary? No   Supply Requested: 1 month   Last Refilled: 06/16/2010 [facility], [address] [place], [place]   Method Requested: Electronic Next Appointment Scheduled: 1.10.12 Initial call taken by: Lannette Donath,  July 16, 2010 1:35 PM  Follow-up for Phone Call        Refill sent until pt can be seen in follow up. Follow-up by: Francee Piccolo CMA Duncan Dull),  July 16, 2010 5:04 PM    Prescriptions: RAMIPRIL 10 MG CAPS (RAMIPRIL) 1 cap by mouth two times a day  #60 x 1   Entered by:   Francee Piccolo CMA (AAMA)   Authorized by:   Danise Edge MD   Signed by:   Francee Piccolo CMA (AAMA) on 07/16/2010   Method used:   Electronically to        [facility]* (retail)       [address] [place], [place]  [address]       Ph: [phone]       Fax: [phone]   RxID:   [id number]

## 2010-08-21 NOTE — Letter (Signed)
Summary: Insulin Resistance Intervention after Stroke Trial  Insulin Resistance Intervention after Stroke Trial   Imported By: Lester [place] 07/22/2010 08:13:23  _____________________________________________________________________  External Attachment:    Type:   Image     Comment:   External Document

## 2010-09-02 ENCOUNTER — Encounter: Payer: Self-pay | Admitting: Family Medicine

## 2010-09-02 ENCOUNTER — Ambulatory Visit (INDEPENDENT_AMBULATORY_CARE_PROVIDER_SITE_OTHER): Payer: Self-pay | Admitting: Family Medicine

## 2010-09-02 DIAGNOSIS — I6350 Cerebral infarction due to unspecified occlusion or stenosis of unspecified cerebral artery: Secondary | ICD-10-CM

## 2010-09-02 DIAGNOSIS — I10 Essential (primary) hypertension: Secondary | ICD-10-CM

## 2010-09-02 DIAGNOSIS — M79609 Pain in unspecified limb: Secondary | ICD-10-CM | POA: Insufficient documentation

## 2010-09-02 DIAGNOSIS — R5383 Other fatigue: Secondary | ICD-10-CM

## 2010-09-02 DIAGNOSIS — E782 Mixed hyperlipidemia: Secondary | ICD-10-CM

## 2010-09-03 ENCOUNTER — Encounter: Payer: Self-pay | Admitting: Family Medicine

## 2010-09-10 NOTE — Assessment & Plan Note (Signed)
Summary: 2 month follow up/ vfw   Vital Signs:  Patient profile:   76 year old male Height:      74 inches (187.96 cm) Weight:      205.25 pounds (93.30 kg) O2 Sat:      100 % on Room air Temp:     97.8 degrees F (36.56 degrees C) oral Pulse rate:   58 / minute BP sitting:   134 / 82  (right arm) BP standing:   154 / 84  (left arm) Cuff size:   large  Vitals Entered By: Josph Macho RMA (September 02, 2010 8:08 AM)  O2 Flow:  Room air CC: 2 month follow up/ CF Is Patient Diabetic? No   History of Present Illness: The 75yo Caucasian male in today for routine follow-up. He has been feeling well. No recent illness, fevers, chills, CP, palp, SOB, GI or GU c/o. he continues in his stroke prevention trial and he reports his last visit that everything was stable. He had no progression nor regression of disease. He reports overall he feels well except for some pain in his left arm which started a couple months ago and tends to be worse when he is using his flexor muscles. He believes he didn't while working at [employer] and it just will not fully resolved. It is tolerable and there are no radicular symptoms associated with the injury. Pain runs from his elbow to his wrist intermittently. His other complaint is just a fatigue and not having quite this energy level he had prior to his stroke but he denies any neurologic symptoms, chest pain palpitations, shortness of breath, fevers, chills, congestion, GI or GU concerns at today's visit.  Current Medications (verified): 1)  Plavix 75 Mg Tabs (Clopidogrel Bisulfate) .... Daily With A Meal 2)  Simvastatin 40 Mg Tabs (Simvastatin) .... Once Daily 3)  Fluocinonide 0.05 % Gel (Fluocinonide) .... Daily 4)  Ramipril 10 Mg Caps (Ramipril) .[name].. 1 Cap By Mouth Two Times A Day 5)  Chlorthalidone 25 Mg Tabs (Chlorthalidone) .[name].. 1 Tab By Mouth Daily  Allergies (verified): No Known Drug Allergies  Past History:  Past medical history reviewed for  relevance to current acute and chronic problems. Social history (including risk factors) reviewed for relevance to current acute and chronic problems.  Social History: Reviewed history from 03/17/2010 and no changes required. Retired from 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 No apparent alopecia or balding. Mouth:  Oral mucosa and oropharynx without lesions or exudates.  Teeth in good repair. Neck:  No deformities, masses, or tenderness noted. Lungs:  Normal respiratory effort, chest expands symmetrically. Lungs are clear to auscultation, no crackles or wheezes. Heart:  Normal rate and regular rhythm. S1 and S2 normal without gallop, murmur, click, rub or other extra sounds. Abdomen:  Bowel sounds positive,abdomen soft and non-tender without masses, organomegaly or hernias noted. Extremities:  No clubbing, cyanosis, edema, or deformity noted with normal full range of motion of all joints.   Cervical Nodes:  No lymphadenopathy noted Psych:  Cognition  and judgment appear intact. Alert and cooperative with normal attention span and concentration. No apparent delusions, illusions, hallucinations   Impression & Recommendations:  Problem # 1:  ESSENTIAL HYPERTENSION, BENIGN (ICD-401.1)  His updated medication list for this problem includes:    Ramipril 10 Mg Caps (Ramipril) .[name]... 1 cap by mouth two times a day    Chlorthalidone 25 Mg Tabs (Chlorthalidone) .[name]... 1 tab by mouth daily Improved on repeat check,  no changes in meds today  Problem # 2:  CEREBROVASCULAR ACCIDENT (ICD-434.91)  His updated medication list for this problem includes:    Plavix 75 Mg Tabs (Clopidogrel bisulfate) .[name]... Daily with a meal No  recurrence, he will continue in his clinical trial for now  Problem # 3:  MIXED HYPERLIPIDEMIA (ICD-272.2)  His updated medication list for this problem includes:    Simvastatin 40 Mg Tabs (Simvastatin) ..... Once daily Tolerating Simvastatin, avoid trans fats and continue simvastatin, check FLP with next visit.  Problem # 4:  ARM PAIN, LEFT (ICD-729.5) Patient has noted some discomfort off and on when using his arms for weight lifting. It occurs over the distal, flexor surface of his left arm. Pain is mild but has been occuring for several months. No neuropathic pain, no weakness in hand. Try ice and Aspercreme as needed and report if symptoms do not resolve or if they worsen for further evaluation  Complete Medication List: 1)  Plavix 75 Mg Tabs (Clopidogrel bisulfate) .... Daily with a meal 2)  Simvastatin 40 Mg Tabs (Simvastatin) .... Once daily 3)  Fluocinonide 0.05 % Gel (Fluocinonide) .... Daily 4)  Ramipril 10 Mg Caps (Ramipril) .[name].. 1 cap by mouth two times a day 5)  Chlorthalidone 25 Mg Tabs (Chlorthalidone) .[name].. 1 tab by mouth daily  Patient Instructions: 1)  Please schedule a follow-up appointment in 2 months or sooner as needed 2)  Watch that sodium in the diet Prescriptions: CHLORTHALIDONE 25 MG TABS (CHLORTHALIDONE) 1 tab by mouth daily  #90 x 3   Entered by:   Josph Macho RMA   Authorized by:   Danise Edge MD   Signed by:   Josph Macho RMA on 09/02/2010   Method used:   Electronically to        [facility]* (retail)       [address] [place], [place]  [address]       Ph: [phone]       Fax: [phone]   RxID:   [id number] PLAVIX 75 MG TABS (CLOPIDOGREL BISULFATE) daily with a meal   #90 x 3   Entered by:   Josph Macho RMA   Authorized by:   Danise Edge MD   Signed by:   Josph Macho RMA on 09/02/2010   Method used:   Electronically to        [facility]* (retail)       [address] [place], [place]  [address]       Ph: [phone]       Fax: [phone]   RxID:   [id number] RAMIPRIL 10 MG CAPS (RAMIPRIL) 1 cap by mouth two times a day  #180 x 3   Entered by:   Josph Macho RMA   Authorized by:   Danise Edge MD   Signed by:   Neysa Bonito  Neale Burly RMA on 09/02/2010   Method used:   Electronically to        [facility]* (retail)       [address] [place], [place]  [address]       Ph: [phone]       Fax: [phone]   RxID:   [id number] SIMVASTATIN 40 MG TABS (SIMVASTATIN) once daily  #90 x 3   Entered by:   Josph Macho RMA   Authorized by:   Danise Edge MD   Signed by:   Josph Macho RMA on 09/02/2010   Method used:   Electronically to        [facility]* (retail)       [address] [place], [place]  [address]       Ph: [phone]       Fax: [phone]   RxID:   [id number]    Orders Added: 1)  Est. Patient Level IV [884[address]]

## 2010-09-25 NOTE — Letter (Signed)
Summary: records from Evie Lacks [facility]  records from [facility]ie [facility]   Imported By: Lester [place] 09/19/2010 08:50:28  _____________________________________________________________________  External Attachment:    Type:   Image     Comment:   External Document

## 2010-09-29 ENCOUNTER — Encounter: Payer: Self-pay | Admitting: Family Medicine

## 2010-10-02 LAB — CBC
HCT: 43.6 % (ref 39.0–52.0)
Hemoglobin: 15.1 g/dL (ref 13.0–17.0)
MCH: 32.1 pg (ref 26.0–34.0)
MCHC: 34.6 g/dL (ref 30.0–36.0)
MCV: 92.6 fL (ref 78.0–100.0)
RBC: 4.71 MIL/uL (ref 4.22–5.81)

## 2010-10-02 LAB — CARDIAC PANEL(CRET KIN+CKTOT+MB+TROPI)
CK, MB: 3.6 ng/mL (ref 0.3–4.0)
Troponin I: 0.01 ng/mL (ref 0.00–0.06)

## 2010-10-02 LAB — URINALYSIS, MICROSCOPIC ONLY
Glucose, UA: NEGATIVE mg/dL
Ketones, ur: NEGATIVE mg/dL
Urobilinogen, UA: 0.2 mg/dL (ref 0.0–1.0)
pH: 6.5 (ref 5.0–8.0)

## 2010-10-02 LAB — SEDIMENTATION RATE: Sed Rate: 4 mm/hr (ref 0–16)

## 2010-10-02 LAB — COMPREHENSIVE METABOLIC PANEL
AST: 23 U/L (ref 0–37)
BUN: 15 mg/dL (ref 6–23)
CO2: 30 mEq/L (ref 19–32)
Calcium: 9.3 mg/dL (ref 8.4–10.5)
Chloride: 106 mEq/L (ref 96–112)
Creatinine, Ser: 0.99 mg/dL (ref 0.4–1.5)
GFR calc Af Amer: >60 mL/min (ref >60)
GFR calc non Af Amer: >60 mL/min (ref >60)
Glucose, Bld: 103 mg/dL — ABNORMAL HIGH (ref 70–99)
Total Bilirubin: 0.7 mg/dL (ref 0.3–1.2)

## 2010-10-02 LAB — LIPID PANEL
Cholesterol: 284 mg/dL — ABNORMAL HIGH (ref 0–200)
HDL: 42 mg/dL (ref >39)
Total CHOL/HDL Ratio: 6.8 RATIO
Triglycerides: 160 mg/dL — ABNORMAL HIGH (ref <150)

## 2010-10-02 LAB — PROTIME-INR: INR: 0.98 (ref 0.00–1.49)

## 2010-10-02 LAB — APTT: aPTT: 31 seconds (ref 24–37)

## 2010-11-04 ENCOUNTER — Encounter: Payer: Self-pay | Admitting: Family Medicine

## 2010-11-04 ENCOUNTER — Ambulatory Visit (INDEPENDENT_AMBULATORY_CARE_PROVIDER_SITE_OTHER): Payer: MEDICARE | Admitting: Family Medicine

## 2010-11-04 DIAGNOSIS — E782 Mixed hyperlipidemia: Secondary | ICD-10-CM

## 2010-11-04 DIAGNOSIS — E785 Hyperlipidemia, unspecified: Secondary | ICD-10-CM

## 2010-11-04 DIAGNOSIS — H9190 Unspecified hearing loss, unspecified ear: Secondary | ICD-10-CM | POA: Insufficient documentation

## 2010-11-04 DIAGNOSIS — E663 Overweight: Secondary | ICD-10-CM

## 2010-11-04 DIAGNOSIS — R351 Nocturia: Secondary | ICD-10-CM

## 2010-11-04 DIAGNOSIS — I6350 Cerebral infarction due to unspecified occlusion or stenosis of unspecified cerebral artery: Secondary | ICD-10-CM

## 2010-11-04 DIAGNOSIS — E875 Hyperkalemia: Secondary | ICD-10-CM

## 2010-11-04 DIAGNOSIS — J309 Allergic rhinitis, unspecified: Secondary | ICD-10-CM

## 2010-11-04 DIAGNOSIS — I63239 Cerebral infarction due to unspecified occlusion or stenosis of unspecified carotid arteries: Secondary | ICD-10-CM

## 2010-11-04 DIAGNOSIS — I10 Essential (primary) hypertension: Secondary | ICD-10-CM

## 2010-11-04 LAB — CBC WITH DIFFERENTIAL/PLATELET
Basophils Absolute: 0 10*3/uL (ref 0.0–0.1)
Basophils Relative: 0.8 % (ref 0.0–3.0)
Eosinophils Absolute: 0.2 10*3/uL (ref 0.0–0.7)
HCT: 38 % — ABNORMAL LOW (ref 39.0–52.0)
Hemoglobin: 12.9 g/dL — ABNORMAL LOW (ref 13.0–17.0)
Lymphs Abs: 1.9 10*3/uL (ref 0.7–4.0)
MCHC: 33.8 g/dL (ref 30.0–36.0)
MCV: 97.6 fl (ref 78.0–100.0)
Neutro Abs: 3.1 10*3/uL (ref 1.4–7.7)
RBC: 3.9 Mil/uL — ABNORMAL LOW (ref 4.22–5.81)
RDW: 15.4 % — ABNORMAL HIGH (ref 11.5–14.6)

## 2010-11-04 LAB — HEPATIC FUNCTION PANEL
AST: 15 U/L (ref 0–37)
Albumin: 4.3 g/dL (ref 3.5–5.2)
Alkaline Phosphatase: 47 U/L (ref 39–117)
Bilirubin, Direct: 0.2 mg/dL (ref 0.0–0.3)
Total Bilirubin: 0.7 mg/dL (ref 0.3–1.2)

## 2010-11-04 LAB — LIPID PANEL
Total CHOL/HDL Ratio: 4
Triglycerides: 106 mg/dL (ref 0.0–149.0)

## 2010-11-04 LAB — RENAL FUNCTION PANEL
Albumin: 4.3 g/dL (ref 3.5–5.2)
BUN: 29 mg/dL — ABNORMAL HIGH (ref 6–23)
CO2: 32 mEq/L (ref 19–32)
Calcium: 9.6 mg/dL (ref 8.4–10.5)
Chloride: 100 mEq/L (ref 96–112)

## 2010-11-04 NOTE — Assessment & Plan Note (Signed)
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 He is unable to hear much when he is in a loud or crowded gathering. Will refer to [facility] for further evaluation and treatment

## 2010-11-04 NOTE — Progress Notes (Signed)
Tanner [name] [id number] 30-Apr-1935 11/04/2010      Progress Note-Follow Up  Subjective  Chief Complaint  Chief Complaint  Patient presents with  . Follow-up    2 month follow up    HPI  Is a 76 year old Caucasian male in today for followup on multiple medical problems. Generally been doing well and has not had any recent febrile illness, Bethann Berkshire the emergency room, chest pain, neurologic complaints, shortness of breath or other urgent concerns. He does have a long history of gradually worsening hearing difficulties. He described it as bilateral and now to the point where he has difficulty hearing in a public gatherings or loud places. He is ready to have this evaluated. 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 He is noting each spring he gets in trouble with congestion, sneezing, and, facial pressure and fatigue and that has recently started. He is not taking any medications thus far. No fevers, chills, ear pain but he does note some tinnitus which is ongoing described as crickets. No sore throat, chest pain, shortness of breath, palpitations, GI or GU complaints.  Past Medical History  Diagnosis Date  . Hypertension   . Hyperlipidemia   . Allergy     seasonal- mostly spring    Past Surgical History  Procedure Date  . Tonsillectomy   . Pilonidal cyst excision 1968    removed  . Knee cartliage repair 1964    Right    Family History  Problem Relation Age of Onset  . Alzheimer's disease Mother   . Emphysema Sister     cigarettes  . Heart disease Son   . Coronary artery disease Brother   . Heart attack Brother   . Other Brother     heart problems- from fathers side  . Cancer Sister     gyn  . Bipolar disorder Daughter   . Obesity Daughter     History   Social History  . Marital Status: Divorced    Spouse Name: N/A    Number of Children: N/A  . Years of Education: N/A   Occupational History  . Not on file.     Social History Main Topics  . Smoking status: Former Smoker -- 0.5 packs/day    Types: Cigarettes    Quit date: 07/20/1976  . Smokeless tobacco: Never Used  . Alcohol Use: 4.2 oz/week    7 Glasses of wine per week  . Drug Use: No  . Sexually Active: No   Other Topics Concern  . Not on file   Social History Narrative  . No narrative on file    Current Outpatient Prescriptions on File Prior to Visit  Medication Sig Dispense Refill  . chlorthalidone (HYGROTON) 25 MG tablet Take 25 mg by mouth daily.        . clopidogrel (PLAVIX) 75 MG tablet Take 75 mg by mouth daily. With a meal       . fluocinonide (LIDEX) 0.05 % gel Apply topically daily.        . ramipril (ALTACE) 10 MG capsule Take 10 mg by mouth 2 (two) times daily.        . simvastatin (ZOCOR) 40 MG tablet Take 40 mg by mouth daily.          No Known Allergies  Review of Systems  Review of Systems  Constitutional: Negative for fever and malaise/fatigue.  HENT: Negative for congestion.   Eyes: Negative for discharge.  Respiratory: Negative for shortness of breath.   Cardiovascular: Negative for chest pain, palpitations and leg swelling.  Gastrointestinal: Negative for nausea, abdominal pain and diarrhea.  Genitourinary: Negative for dysuria.  Musculoskeletal: Negative for falls.  Skin: Negative for rash.  Neurological: Negative for loss of consciousness and headaches.  Endo/Heme/Allergies: Negative for polydipsia.  Psychiatric/Behavioral: Negative for depression and suicidal ideas. The patient is not nervous/anxious and does not have insomnia.     Objective  BP 132/76  Pulse 54  Temp(Src) 97.7 F (36.5 C) (Oral)  Ht 6\' 2"  (1.88 m)  Wt 203 lb 1.9 oz (92.135 kg)  BMI 26.08 kg/m2  SpO2 100%  Physical Exam   No results found for this basename: TSH   Lab Results  Component Value Date   WBC 7.5 05/20/2010   HGB 14.8 05/20/2010   HCT 43.5 05/20/2010   MCV 94.8 05/20/2010   PLT 204.0 05/20/2010   Lab  Results  Component Value Date   CREATININE 1.1 06/04/2010   BUN 19 06/04/2010   NA 137 06/04/2010   K 4.6 06/04/2010   CL 100 06/04/2010   CO2 30 06/04/2010   Lab Results  Component Value Date   ALT 18 05/20/2010   AST 21 05/20/2010   ALKPHOS 78 05/20/2010   BILITOT 1.1 05/20/2010   Lab Results  Component Value Date   CHOL 200 05/20/2010   Lab Results  Component Value Date   HDL 46.00 05/20/2010   Lab Results  Component Value Date   LDLCALC 123* 05/20/2010   Lab Results  Component Value Date   TRIG 154.0* 05/20/2010   Lab Results  Component Value Date   CHOLHDL 4 05/20/2010     Physical Exam  Constitutional: He is oriented to person, place, and time and well-developed, well-nourished, and in no distress. No distress.  HENT:  Head: Normocephalic and atraumatic.  Eyes: Conjunctivae are normal.  Neck: Neck supple. No thyromegaly present.  Cardiovascular: Normal rate, regular rhythm and normal heart sounds.   No murmur heard. Pulmonary/Chest: Effort normal and breath sounds normal. No respiratory distress.  Abdominal: He exhibits no distension and no mass. There is no tenderness.  Musculoskeletal: He exhibits no edema.  Neurological: He is alert and oriented to person, place, and time.  Skin: Skin is warm.  Psychiatric: Memory, affect and judgment normal.  Assessment & Plan   OVERWEIGHT Patient continues to walk daily but has stopped going to the gym regularly is encouraged to return to regular sessions and to continue to watch his po intake  MIXED HYPERLIPIDEMIA Tolerating Simvastatin, will check an FLP today for further evaluation and he will continue to avoid trans fats and to walk daily  HYPERKALEMIA Had resolved at last draw will recheck today  ESSENTIAL HYPERTENSION, BENIGN Well controlled, no change in meds  CEREBROVASCULAR ACCIDENT No new episodes, patient continues to follow up with neurology, no change in medications  CAROTID ARTERY OCCLUSION, WITH  INFARCTION Recent carotid dopplers showed no progression of disease, no changes today  ALLERGIC RHINITIS CAUSE UNSPECIFIED Notes he struggles with sneezing, congestion and fatigue every spring, he may use OTC Cetirizine daily prn if symptoms persist  Hearing loss Patient with a long history of significant noise exposure both in the [employer] and 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 He is unable to hear much when he is in a loud or crowded gathering. Will refer to [facility] for  further evaluation and treatment

## 2010-11-04 NOTE — Assessment & Plan Note (Signed)
Well controlled, no change in meds 

## 2010-11-04 NOTE — Assessment & Plan Note (Addendum)
Patient continues to walk daily but has stopped going to the gym regularly is encouraged to return to regular sessions and to continue to watch his po intake

## 2010-11-04 NOTE — Assessment & Plan Note (Signed)
Had resolved at last draw will recheck today

## 2010-11-04 NOTE — Assessment & Plan Note (Signed)
Recent carotid dopplers showed no progression of disease, no changes today

## 2010-11-04 NOTE — Patient Instructions (Signed)

## 2010-11-04 NOTE — Assessment & Plan Note (Signed)
Notes he struggles with sneezing, congestion and fatigue every spring, he may use OTC Cetirizine daily prn if symptoms persist

## 2010-11-04 NOTE — Assessment & Plan Note (Signed)
No new episodes, patient continues to follow up with neurology, no change in medications

## 2010-11-04 NOTE — Assessment & Plan Note (Signed)
Tolerating Simvastatin, will check an FLP today for further evaluation and he will continue to avoid trans fats and to walk daily

## 2010-11-05 MED ORDER — ROSUVASTATIN CALCIUM 40 MG PO TABS: 40.0000 mg | ORAL_TABLET | Freq: Every day | ORAL | Status: DC

## 2010-11-05 NOTE — Progress Notes (Signed)
Addended by: Josph Macho on: 11/05/2010 03:08 PM   Modules accepted: Orders

## 2010-11-13 ENCOUNTER — Other Ambulatory Visit: Payer: Self-pay | Admitting: Family Medicine

## 2010-11-13 ENCOUNTER — Other Ambulatory Visit: Payer: MEDICARE

## 2010-11-13 LAB — HEMOCCULT SLIDES (X 3 CARDS)
Fecal Occult Blood: NEGATIVE
OCCULT 4: NEGATIVE

## 2010-12-02 ENCOUNTER — Other Ambulatory Visit (INDEPENDENT_AMBULATORY_CARE_PROVIDER_SITE_OTHER): Payer: MEDICARE

## 2010-12-02 DIAGNOSIS — E782 Mixed hyperlipidemia: Secondary | ICD-10-CM

## 2010-12-02 LAB — HEPATIC FUNCTION PANEL
ALT: 16 U/L (ref 0–53)
AST: 18 U/L (ref 0–37)
Bilirubin, Direct: 0.1 mg/dL (ref 0.0–0.3)
Total Bilirubin: 0.4 mg/dL (ref 0.3–1.2)

## 2010-12-02 NOTE — Op Note (Signed)
Tanner [name], Tanner [name]             ACCOUNT NO.:  [id number]   MEDICAL RECORD NO.:  [id number]          PATIENT TYPE:  AMB   LOCATION:  [facility]                         FACILITY:  [facility]   PHYSICIAN:  Jene Every, M.D.    DATE OF BIRTH:  [birth year]   DATE OF PROCEDURE:  02/10/2008  DATE OF DISCHARGE:                               OPERATIVE REPORT   PREOPERATIVE DIAGNOSIS:  Rotator cuff tear of the right shoulder.   POSTOPERATIVE DIAGNOSIS:  Rotator cuff tear of the right shoulder.   PROCEDURE PERFORMED:  1. Mini open rotator cuff repair utilizing the push lock anchor      sutures and Stryker courser anchors.  2. Acromioplasty bursectomy.   ANESTHESIA:  General.   ASSISTANT:  Strader   BRIEF HISTORY AND INDICATION:  This is a 76 year old with rotator cuff  tear, slightly retracted, type 2 to 3 acromion.  He had no popping and  snapping.  He had perhaps a type 2 SLAP tear, though it was not fully  detached.  He was indicated for mini open rotator cuff repair and  acromioplasty.  Risks and benefits discussed including bleeding,  infection, suboptimal range of motion, persistent postoperative pain,  adhesions, 4-5 months until he is completely recovered, DVT, PE,  anesthetic complications, etc.   The patient in supine beach-chair position.  After induction of adequate  general anesthesia, 2 g Kefzol, the right shoulder and upper externally  was prepped in the usual sterile fashion.  Surgical marker was utilized  to delineate the acromion, AC joint.  A small incision was made over the  anterolateral aspect of the acromion and Longer's.  Raphe between the  anterolateral heads of the deltoid were identified and subperiosteally  elevated from the anterolateral aspect of the acromion, dividing the CA  ligament.  Next, hypertrophic bursa was excised.  Oscillating saw  utilized to remove a small portion of the anterior inferior aspect of  the acromion where there was a small spur noted.   Next, a retracted tear  of the supraspinatus was noted in a semicircular fashion.  The edges  were debrided.  We fashioned the bony trough just medial to the greater  tuberosity portion with a Baer rongeur to good bleeding tissue just  lateral to the articular surface.  We then irrigated it copiously,  examined the joint, inspected it digitally, and lysed adhesions.  I then  utilized an awl and placed it just lateral to the articular surface and  placed 2 Stryker suture anchors into the bone, excellent purchase, was  not able to pull them out with reasonable pull.  We then advanced the  tendon out to the greater tuberosity and threaded it up through the  rotator cuff with a good space between each thread.  With the arm  slightly abducted and the tendon retracted to the greater tuberosity, we  tied the tendon down to the bed with a good surgeon's knot.  I then took  the remaining ends of the suture and crossed them in a matt-like fashion  and then secured the ends in a push lock configuration.  We  used an awl  and 2 over the greater tuberosity with an awl and then we pushed the  push lock in there, a 4-5 push lock, threaded the ends in there and  impacted it.  One push lock was dysfunctional.  We therefore replaced  that to place a second.  This then excellently secured the remaining  threads of the suture.  The redundant ends were removed.  Good range  with full coverage of the rotator cuff.  Excellent purchase.  A good  range.  Again. I digitally palpated the subacromial space.  There was  ample space with abduction of the arm.  Wound copiously irrigated.  Then  the raphe was repaired with #1 Vicryl interrupted figure-of-eight  sutures over and top through the acromion, subcu 2-0 Vicryl simple  sutures.  Skin was reapproximated with 4-0 subcuticular Prolene.  Wound  reinforced with Steri-Strips.  Sterile dressing applied, placed in an  abduction pillow, extubated without difficulty, and  transported to the  recovery room in satisfactory condition.   The patient tolerated the procedure well with no complications.   ASSISTANT:  Roma Schanz.   Minimal blood loss.      Jene Every, M.D.  Electronically Signed     JB/MEDQ  D:  02/10/2008  T:  02/10/2008  Job:  [id number]

## 2011-01-02 ENCOUNTER — Other Ambulatory Visit: Payer: Self-pay | Admitting: Family Medicine

## 2011-01-29 ENCOUNTER — Other Ambulatory Visit: Payer: MEDICARE

## 2011-01-30 ENCOUNTER — Other Ambulatory Visit (INDEPENDENT_AMBULATORY_CARE_PROVIDER_SITE_OTHER): Payer: Medicare Other

## 2011-01-30 DIAGNOSIS — E782 Mixed hyperlipidemia: Secondary | ICD-10-CM

## 2011-01-30 DIAGNOSIS — I10 Essential (primary) hypertension: Secondary | ICD-10-CM

## 2011-01-30 LAB — CBC WITH DIFFERENTIAL/PLATELET
Eosinophils Absolute: 0.2 10*3/uL (ref 0.0–0.7)
Eosinophils Relative: 3.2 % (ref 0.0–5.0)
Lymphocytes Relative: 40.3 % (ref 12.0–46.0)
MCHC: 34.5 g/dL (ref 30.0–36.0)
MCV: 97.8 fl (ref 78.0–100.0)
Monocytes Absolute: 0.5 10*3/uL (ref 0.1–1.0)
Neutrophils Relative %: 46.5 % (ref 43.0–77.0)
Platelets: 129 10*3/uL — ABNORMAL LOW (ref 150.0–400.0)
RBC: 3.59 Mil/uL — ABNORMAL LOW (ref 4.22–5.81)
WBC: 5 10*3/uL (ref 4.5–10.5)

## 2011-01-30 LAB — HEPATIC FUNCTION PANEL
ALT: 16 U/L (ref 0–53)
AST: 18 U/L (ref 0–37)
Bilirubin, Direct: 0.1 mg/dL (ref 0.0–0.3)
Total Bilirubin: 1.1 mg/dL (ref 0.3–1.2)
Total Protein: 6.5 g/dL (ref 6.0–8.3)

## 2011-01-30 LAB — RENAL FUNCTION PANEL
Chloride: 101 mEq/L (ref 96–112)
GFR: 64.76 mL/min (ref >60.00)
Glucose, Bld: 90 mg/dL (ref 70–99)
Phosphorus: 3.6 mg/dL (ref 2.3–4.6)
Potassium: 4 mEq/L (ref 3.5–5.1)
Sodium: 137 mEq/L (ref 135–145)

## 2011-01-30 LAB — LIPID PANEL
HDL: 44.8 mg/dL (ref >39.00)
LDL Cholesterol: 76 mg/dL (ref 0–99)
VLDL: 13.6 mg/dL (ref 0.0–40.0)

## 2011-02-03 ENCOUNTER — Ambulatory Visit (INDEPENDENT_AMBULATORY_CARE_PROVIDER_SITE_OTHER): Payer: Medicare Other | Admitting: Family Medicine

## 2011-02-03 ENCOUNTER — Encounter: Payer: Self-pay | Admitting: Family Medicine

## 2011-02-03 DIAGNOSIS — E663 Overweight: Secondary | ICD-10-CM

## 2011-02-03 DIAGNOSIS — E782 Mixed hyperlipidemia: Secondary | ICD-10-CM

## 2011-02-03 DIAGNOSIS — E875 Hyperkalemia: Secondary | ICD-10-CM

## 2011-02-03 DIAGNOSIS — D649 Anemia, unspecified: Secondary | ICD-10-CM

## 2011-02-03 DIAGNOSIS — I6350 Cerebral infarction due to unspecified occlusion or stenosis of unspecified cerebral artery: Secondary | ICD-10-CM

## 2011-02-03 DIAGNOSIS — I10 Essential (primary) hypertension: Secondary | ICD-10-CM

## 2011-02-03 DIAGNOSIS — R300 Dysuria: Secondary | ICD-10-CM

## 2011-02-03 HISTORY — DX: Anemia, unspecified: D64.9

## 2011-02-03 MED ORDER — CLOPIDOGREL BISULFATE 75 MG PO TABS: 75.0000 mg | ORAL_TABLET | Freq: Every day | ORAL | Status: DC

## 2011-02-03 MED ORDER — ROSUVASTATIN CALCIUM 40 MG PO TABS: 40.0000 mg | ORAL_TABLET | Freq: Every day | ORAL | Status: DC

## 2011-02-03 MED ORDER — CHLORTHALIDONE 25 MG PO TABS: 25.0000 mg | ORAL_TABLET | Freq: Every day | ORAL | Status: DC

## 2011-02-03 NOTE — Progress Notes (Signed)
Tanner [name] [id number] 11-27-1934 02/03/2011      Progress Note-Follow Up  Subjective  Chief Complaint  Chief Complaint  Patient presents with  . Follow-up    follow up    HPI  Patient is a 76 yo caucasian male in today for routine follow up. No concerns today. He has has no recent illness, fevers, chills, CP, palp, SOB, GI or GU c/o. He acknowledges he has been eating a bit more recently and thus the weight gain. He is interested in switching to generic Plavix but is happy with his response to Crestor and has had no myalgias or fatigue. He is taking his meds regularly.   Past Medical History  Diagnosis Date  . Hypertension   . Hyperlipidemia   . Allergy     seasonal- mostly spring  . TOBACCO ABUSE, HX OF 03/17/2010  . Overweight 07/01/2010  . Personal history of other infectious and parasitic disease 03/17/2010  . Mixed hyperlipidemia 03/17/2010  . HYPERKALEMIA 05/27/2010  . FATIGUE 03/17/2010  . Essential hypertension, benign 03/17/2010  . CHICKENPOX, HX OF 03/17/2010  . CEREBROVASCULAR ACCIDENT 03/17/2010  . CAROTID ARTERY OCCLUSION, WITH INFARCTION 03/17/2010  . ASTHMA, CHILDHOOD 03/17/2010  . ALLERGIC RHINITIS CAUSE UNSPECIFIED 03/17/2010  . Hearing loss 11/04/2010  . Anemia 02/03/2011    Past Surgical History  Procedure Date  . Tonsillectomy   . Pilonidal cyst excision 1968    removed  . Knee cartliage repair 1964    Right    Family History  Problem Relation Age of Onset  . Alzheimer's disease Mother   . Emphysema Sister     cigarettes  . Heart disease Son   . Coronary artery disease Brother   . Heart attack Brother   . Other Brother     heart problems- from fathers side  . Cancer Sister     gyn  . Bipolar disorder Daughter   . Obesity Daughter     History   Social History  . Marital Status: Divorced    Spouse Name: N/A    Number of Children: N/A  . Years of Education: N/A   Occupational History  . Not on file.   Social History Main Topics  .  Smoking status: Former Smoker -- 0.5 packs/day    Types: Cigarettes    Quit date: 07/20/1976  . Smokeless tobacco: Never Used  . Alcohol Use: 4.2 oz/week    7 Glasses of wine per week  . Drug Use: No  . Sexually Active: No   Other Topics Concern  . Not on file   Social History Narrative  . No narrative on file    Current Outpatient Prescriptions on File Prior to Visit  Medication Sig Dispense Refill  . fluocinonide (LIDEX) 0.05 % gel Apply topically daily.        . ramipril (ALTACE) 10 MG capsule Take 10 mg by mouth 2 (two) times daily.          No Known Allergies  Review of Systems  Review of Systems  Constitutional: Negative for fever and malaise/fatigue.  HENT: Negative for congestion.   Eyes: Negative for discharge.  Respiratory: Negative for shortness of breath.   Cardiovascular: Negative for chest pain, palpitations and leg swelling.  Gastrointestinal: Negative for nausea, abdominal pain and diarrhea.  Genitourinary: Negative for dysuria.  Musculoskeletal: Negative for falls.  Skin: Negative for rash.  Neurological: Negative for loss of consciousness and headaches.  Endo/Heme/Allergies: Negative for polydipsia.  Psychiatric/Behavioral: Negative for depression and  suicidal ideas. The patient is not nervous/anxious and does not have insomnia.     Objective  BP 138/84  Pulse 59  Temp(Src) 98.3 F (36.8 C) (Oral)  Ht 6\' 2"  (1.88 m)  Wt 211 lb (95.709 kg)  BMI 27.09 kg/m2  SpO2 99%  Physical Exam  Physical Exam  Constitutional: He is oriented to person, place, and time and well-developed, well-nourished, and in no distress. No distress.  HENT:  Head: Normocephalic and atraumatic.  Eyes: Conjunctivae are normal.  Neck: Neck supple. No thyromegaly present.  Cardiovascular: Normal rate, regular rhythm and normal heart sounds.   No murmur heard. Pulmonary/Chest: Effort normal and breath sounds normal. No respiratory distress.  Abdominal: He exhibits no  distension and no mass. There is no tenderness.  Musculoskeletal: He exhibits no edema.  Neurological: He is alert and oriented to person, place, and time.  Skin: Skin is warm.  Psychiatric: Memory, affect and judgment normal.    No results found for this basename: TSH   Lab Results  Component Value Date   WBC 5.0 01/30/2011   HGB 12.1* 01/30/2011   HCT 35.1* 01/30/2011   MCV 97.8 01/30/2011   PLT 129.0* 01/30/2011   Lab Results  Component Value Date   CREATININE 1.2 01/30/2011   BUN 22 01/30/2011   NA 137 01/30/2011   K 4.0 01/30/2011   CL 101 01/30/2011   CO2 31 01/30/2011   Lab Results  Component Value Date   ALT 16 01/30/2011   AST 18 01/30/2011   ALKPHOS 42 01/30/2011   BILITOT 1.1 01/30/2011   Lab Results  Component Value Date   CHOL 134 01/30/2011   Lab Results  Component Value Date   HDL 44.80 01/30/2011   Lab Results  Component Value Date   LDLCALC 76 01/30/2011   Lab Results  Component Value Date   TRIG 68.0 01/30/2011   Lab Results  Component Value Date   CHOLHDL 3 01/30/2011     Assessment & Plan  ESSENTIAL HYPERTENSION, BENIGN Mildly elevated upon arrival.  HYPERKALEMIA Normalized on repeat labs  MIXED HYPERLIPIDEMIA Recent lipid profile today, cont Crestor at current dose  CEREBROVASCULAR ACCIDENT No recurrent episodes, continue current meds  OVERWEIGHT Mild weight gain, avoid trans fats, lean on lean proteins and complex carbs, with fresh fruits and vegetables.  Anemia Mild, encouraged increased leafy greens, patient has not had a colonoscopy in 10+ years, will order this, hemeoccult cards were negative

## 2011-02-03 NOTE — Assessment & Plan Note (Signed)
Normalized on repeat labs

## 2011-02-03 NOTE — Assessment & Plan Note (Signed)
Mildly elevated upon arrival.

## 2011-02-03 NOTE — Patient Instructions (Signed)

## 2011-02-03 NOTE — Assessment & Plan Note (Signed)
No recurrent episodes, continue current meds

## 2011-02-03 NOTE — Assessment & Plan Note (Signed)
Mild weight gain, avoid trans fats, lean on lean proteins and complex carbs, with fresh fruits and vegetables.

## 2011-02-03 NOTE — Assessment & Plan Note (Signed)
Recent lipid profile today, cont Crestor at current dose

## 2011-02-03 NOTE — Assessment & Plan Note (Signed)
Mild, encouraged increased leafy greens, patient has not had a colonoscopy in 10+ years, will order this, hemeoccult cards were negative

## 2011-02-06 ENCOUNTER — Encounter: Payer: Self-pay | Admitting: Internal Medicine

## 2011-03-02 ENCOUNTER — Encounter: Payer: Self-pay | Admitting: Internal Medicine

## 2011-03-02 ENCOUNTER — Ambulatory Visit (INDEPENDENT_AMBULATORY_CARE_PROVIDER_SITE_OTHER): Payer: Medicare Other | Admitting: Internal Medicine

## 2011-03-02 VITALS — BP 132/68 | HR 60 | Ht 74.0 in | Wt 212.0 lb

## 2011-03-02 DIAGNOSIS — Z7902 Long term (current) use of antithrombotics/antiplatelets: Secondary | ICD-10-CM

## 2011-03-02 DIAGNOSIS — Z1211 Encounter for screening for malignant neoplasm of colon: Secondary | ICD-10-CM

## 2011-03-02 MED ORDER — PEG-KCL-NACL-NASULF-NA ASC-C 100 G PO SOLR: 1.0000 | ORAL | Status: DC

## 2011-03-02 NOTE — Progress Notes (Signed)
Subjective:    Patient ID: Tanner [name], male    DOB: 11-03-1934, 76 y.o.   MRN: [id number]  HPI Tanner [name] is a 76 yo male with hx of HTN, HL, and carotid artery disease with resultant CVA in Aug 2011 who presents to discuss colonoscopy. Pt reports no prior colonoscopy, but when he say his PCP in July 2012 he was found to have a mild anemia.  He denies abd complaints today.  No abd pain, nausea, vomiting, diarrhea or constipation.  His stools have been normal for him.  No rectal bleeding, hematochezia or melena.  Appetite has been excellent and weight has been stable (if not slightly increased).  He also denies f/c/ns, cp, dyspnea, edema.    Regarding his Plavix, he was started on this 1 year ago after his CVA.  At his last f/u with neurology, his asa was stopped in favor of plavix alone. He is unsure the planned duration for this med, but assumes life-long.  He reports having a f/u doppler US of his carotids in Jan 2012 and was told he has 100% occlusion on the right and 56% on the left.  He believes this to be stable dx with no planned invention at present.   Review of Systems  Constitutional: Negative.   HENT: Negative.   Eyes: Negative.   Respiratory: Negative.   Cardiovascular: Negative.   Gastrointestinal: Negative.   All other systems reviewed and are negative.   Active Ambulatory Problems    Diagnosis Date Noted  . MIXED HYPERLIPIDEMIA 03/17/2010  . HYPERKALEMIA 05/27/2010  . ESSENTIAL HYPERTENSION, BENIGN 03/17/2010  . CAROTID ARTERY OCCLUSION, WITH INFARCTION 03/17/2010  . CEREBROVASCULAR ACCIDENT 03/17/2010  . ALLERGIC RHINITIS CAUSE UNSPECIFIED 03/17/2010  . ASTHMA, CHILDHOOD 03/17/2010  . FATIGUE 03/17/2010  . Personal history of other infectious and parasitic disease 03/17/2010  . TOBACCO ABUSE, HX OF 03/17/2010  . CHICKENPOX, HX OF 03/17/2010  . OVERWEIGHT 07/01/2010  . ARM PAIN, LEFT 09/02/2010  . Hearing loss 11/04/2010  . Anemia 02/03/2011   Resolved  Ambulatory Problems    Diagnosis Date Noted  . No Resolved Ambulatory Problems   Past Medical History  Diagnosis Date  . Allergy    Outpatient Encounter Prescriptions as of 03/02/2011  Medication Sig Dispense Refill  . chlorthalidone (HYGROTON) 25 MG tablet Take 1 tablet (25 mg total) by mouth daily.  30 tablet  5  . clopidogrel (PLAVIX) 75 MG tablet Take 1 tablet (75 mg total) by mouth daily. With a meal  Generic please  90 tablet  3  . fluocinonide (LIDEX) 0.05 % gel Apply topically daily as needed.       . ramipril (ALTACE) 10 MG capsule Take 10 mg by mouth 2 (two) times daily.        . rosuvastatin (CRESTOR) 40 MG tablet Take 1 tablet (40 mg total) by mouth daily.  90 tablet  3  . peg 3350 powder (MOVIPREP) 100 G SOLR Take 1 kit (100 g total) by mouth as directed. See written handout  1 kit  0   All: NKDA  FH: neg for GI malignancy, neg for colon polyps SH: remote tobacco, social ETOH, no illicits    Objective:   Physical Exam  Constitutional: He is oriented to person, place, and time. He appears well-developed and well-nourished.  HENT:  Head: Normocephalic.  Eyes: EOM are normal. Pupils are equal, round, and reactive to light.  Neck: Neck supple. No JVD present. No thyromegaly present.  Cardiovascular: Normal  rate, regular rhythm and normal heart sounds.   Pulmonary/Chest: Effort normal and breath sounds normal.  Abdominal: Soft. Bowel sounds are normal. He exhibits no distension and no mass. There is no tenderness.  Musculoskeletal: He exhibits no edema.  Neurological: He is alert and oriented to person, place, and time.  Skin: Skin is warm.  Psychiatric: He has a normal mood and affect.   CBC    Component Value Date/Time   WBC 5.0 01/30/2011 0925   RBC 3.59* 01/30/2011 0925   HGB 12.1* 01/30/2011 0925   HCT 35.1* 01/30/2011 0925   PLT 129.0* 01/30/2011 0925   MCV 97.8 01/30/2011 0925   MCH 32.1 03/10/2010 2047   MCHC 34.5 01/30/2011 0925   RDW 15.7* 01/30/2011 0925    LYMPHSABS 2.0 01/30/2011 0925   MONOABS 0.5 01/30/2011 0925   EOSABS 0.2 01/30/2011 0925   BASOSABS 0.0 01/30/2011 0925   FOBT - negative x 6 (April 2012)     Assessment & Plan:  76 yo male with hx of HTN, HL, and carotid artery disease with resultant CVA in Aug 2011 who presents to discuss colonoscopy in the setting of a new, mild macrocytic anemia.  1. Colonoscopy - his new anemia is not iron def and his FOBT was negative earlier this year.  Both of these are very reassuring, but based on age we will plan on colonoscopy.  I will contact Dr. Marlis Edelson office to see if okay to hold plavix for the procedure (it is possible to perform colonoscopy on plavix, but if there are lesions needing polypectomy, then he would need another procedure off plavix).  The pt preferred only 1 exam (if possible).   2. Anemia - normocytic, but MCV approaching 100.  If this fails to resolve on next check, then would consider check B12/folate.

## 2011-03-02 NOTE — Progress Notes (Deleted)
  Subjective:    Patient ID: Tanner [name], male    DOB: [birth year], 76 y.o.   MRN: [id number]  HPI    Review of Systems     Objective:   Physical Exam        Assessment & Plan:

## 2011-03-02 NOTE — Patient Instructions (Signed)
You have been scheduled for a colonoscopy. Your prep has been sent to your pharmacy. We will contact you regarding your plavix.

## 2011-03-04 ENCOUNTER — Telehealth: Payer: Self-pay

## 2011-03-04 NOTE — Telephone Encounter (Signed)
Dr Rhea Belton I received a response from Dr Pearlean Brownie about holding the patients Plavix.  He states the pt may NOT hold plavix for diagnostic procedure but he CAN hold plavix 5 days if therapuetic.  Do you want him to hold the plavix in case of the need for biopsies?

## 2011-03-05 NOTE — Telephone Encounter (Signed)
Dr Rhea Belton the pt has decided that he would like to come off the plavix for the procedure.  I did advise him of all the risk and he still wants to be OFF.  He is stopping 5 days prior and will resume after the procedure as directed by Dr Rhea Belton.

## 2011-03-05 NOTE — Telephone Encounter (Signed)
Tanner [name] The colonoscopy in this case would be diagnostic, therefore I think the best course would be to do colonoscopy on Tanner [name].  Pt needs to understand that lesions > 5mm would not be taken and he could then need another therapeutic colonoscopy for polypectomy off Tanner [name].  This is what I interpret the neurologist wants [place] to do, which is okay with me..if okay with the pt. Thanks

## 2011-03-05 NOTE — Telephone Encounter (Signed)
Ok, noted (pt aware of risks of holding plavix and wishes to proceed with colonoscopy off plavix)

## 2011-03-16 ENCOUNTER — Ambulatory Visit (AMBULATORY_SURGERY_CENTER): Payer: Medicare Other | Admitting: Internal Medicine

## 2011-03-16 ENCOUNTER — Encounter: Payer: Self-pay | Admitting: Internal Medicine

## 2011-03-16 VITALS — BP 145/76 | HR 57 | Temp 97.2°F | Resp 14 | Ht 74.4 in | Wt 212.0 lb

## 2011-03-16 DIAGNOSIS — D126 Benign neoplasm of colon, unspecified: Secondary | ICD-10-CM

## 2011-03-16 DIAGNOSIS — Z1211 Encounter for screening for malignant neoplasm of colon: Secondary | ICD-10-CM

## 2011-03-16 MED ORDER — SODIUM CHLORIDE 0.9 % IV SOLN: 500.0000 mL | INTRAVENOUS | Status: DC

## 2011-03-16 NOTE — Patient Instructions (Signed)
Follow your discharge instructions.  Contact Dr. Rhea Belton with any signs of bleeding.  Continue your medications.  Restart Plavix tomorrow, 03/17/11.  Await pathology results.  High Fiber Diet.

## 2011-03-17 ENCOUNTER — Telehealth: Payer: Self-pay

## 2011-03-17 NOTE — Telephone Encounter (Signed)

## 2011-03-19 ENCOUNTER — Encounter: Payer: Self-pay | Admitting: Internal Medicine

## 2011-04-17 LAB — DIFFERENTIAL
Basophils Absolute: 0.1
Eosinophils Absolute: 0.3
Lymphs Abs: 2.1
Neutro Abs: 3.9

## 2011-04-17 LAB — BASIC METABOLIC PANEL
BUN: 16
CO2: 33 — ABNORMAL HIGH
Chloride: 105
Creatinine, Ser: 1.01
Potassium: 4.6

## 2011-04-17 LAB — CBC
HCT: 42.5
MCHC: 33.2
MCV: 93.4
RBC: 4.55

## 2011-06-04 ENCOUNTER — Other Ambulatory Visit (INDEPENDENT_AMBULATORY_CARE_PROVIDER_SITE_OTHER): Payer: Medicare Other

## 2011-06-04 DIAGNOSIS — I10 Essential (primary) hypertension: Secondary | ICD-10-CM

## 2011-06-04 LAB — CBC
HCT: 38.6 % — ABNORMAL LOW (ref 39.0–52.0)
MCH: 32.1 pg (ref 26.0–34.0)
MCV: 96.7 fL (ref 78.0–100.0)
RDW: 14.8 % (ref 11.5–15.5)

## 2011-06-05 LAB — RENAL FUNCTION PANEL
CO2: 29 mEq/L (ref 19–32)
Calcium: 9.5 mg/dL (ref 8.4–10.5)
Chloride: 102 mEq/L (ref 96–112)
Creatinine, Ser: 1.1 mg/dL (ref 0.4–1.5)
GFR: 69.48 mL/min (ref >60.00)
Sodium: 139 mEq/L (ref 135–145)

## 2011-06-05 LAB — LIPID PANEL
HDL: 50.3 mg/dL (ref >39.00)
Triglycerides: 143 mg/dL (ref 0.0–149.0)

## 2011-06-05 LAB — HEPATIC FUNCTION PANEL
ALT: 21 U/L (ref 0–53)
Albumin: 4.5 g/dL (ref 3.5–5.2)
Alkaline Phosphatase: 47 U/L (ref 39–117)
Total Protein: 7.5 g/dL (ref 6.0–8.3)

## 2011-06-09 ENCOUNTER — Ambulatory Visit: Payer: Medicare Other | Admitting: Family Medicine

## 2011-06-10 ENCOUNTER — Encounter: Payer: Self-pay | Admitting: Family Medicine

## 2011-06-10 ENCOUNTER — Ambulatory Visit (INDEPENDENT_AMBULATORY_CARE_PROVIDER_SITE_OTHER): Payer: Medicare Other | Admitting: Family Medicine

## 2011-06-10 VITALS — BP 125/81 | HR 65 | Temp 97.5°F | Ht 74.0 in | Wt 217.8 lb

## 2011-06-10 DIAGNOSIS — L570 Actinic keratosis: Secondary | ICD-10-CM

## 2011-06-10 DIAGNOSIS — D649 Anemia, unspecified: Secondary | ICD-10-CM

## 2011-06-10 DIAGNOSIS — I10 Essential (primary) hypertension: Secondary | ICD-10-CM

## 2011-06-10 DIAGNOSIS — I6350 Cerebral infarction due to unspecified occlusion or stenosis of unspecified cerebral artery: Secondary | ICD-10-CM

## 2011-06-10 DIAGNOSIS — I63239 Cerebral infarction due to unspecified occlusion or stenosis of unspecified carotid arteries: Secondary | ICD-10-CM

## 2011-06-10 DIAGNOSIS — Z23 Encounter for immunization: Secondary | ICD-10-CM

## 2011-06-10 DIAGNOSIS — D126 Benign neoplasm of colon, unspecified: Secondary | ICD-10-CM

## 2011-06-10 HISTORY — DX: Benign neoplasm of colon, unspecified: D12.6

## 2011-06-10 NOTE — Progress Notes (Signed)
Tanner [name] [id number] [birth year] 06/10/2011      Progress Note-Follow Up  Subjective  Chief Complaint  Chief Complaint  Patient presents with  . Follow-up    4 month follow up    HPI  Patient is a 76 yo caucasian male in today for routine folllow up. He had a colonoscopy in August 2012 and they did find a Tubular Adenoma and they are recommending a repeat colonoscopy in 3 years. He had no difficulty with the procedure. He has no bowel concerns at this time. He reports overall he feels well. No recent illness, URI symptoms, chest pain, palpitations, shortness of breath, GI and GU complaints he has had no new neurologic events for an overall feels well  Past Medical History  Diagnosis Date  . Allergy     seasonal- mostly spring  . TOBACCO ABUSE, HX OF 03/17/2010  . Overweight 07/01/2010  . Personal history of other infectious and parasitic disease 03/17/2010  . Mixed hyperlipidemia 03/17/2010  . HYPERKALEMIA 05/27/2010  . FATIGUE 03/17/2010  . CHICKENPOX, HX OF 03/17/2010  . CEREBROVASCULAR ACCIDENT 03/17/2010  . CAROTID ARTERY OCCLUSION, WITH INFARCTION 03/17/2010  . ASTHMA, CHILDHOOD 03/17/2010  . Hearing loss 11/04/2010    bilateral  . Anemia 02/03/2011  . Hypertension   . Tubular adenoma of colon 06/10/2011    Past Surgical History  Procedure Date  . Tonsillectomy   . Pilonidal cyst excision 1968    removed  . Knee cartliage repair 1964, 1990    bilateral  . Rotator cuff repair     right    Family History  Problem Relation Age of Onset  . Alzheimer's disease Mother   . Emphysema Sister     cigarettes  . Heart disease Son   . Coronary artery disease Brother   . Heart attack Brother   . Other Brother     heart problems- from fathers side  . Cancer Sister     gyn  . Bipolar disorder Daughter   . Obesity Daughter     History   Social History  . Marital Status: Divorced    Spouse Name: N/A    Number of Children: 3  . Years of Education: N/A    Occupational History  . retired    Social History Main Topics  . Smoking status: Former Smoker -- 0.5 packs/day    Types: Cigarettes    Quit date: 07/20/1976  . Smokeless tobacco: Never Used  . Alcohol Use: 4.2 oz/week    7 Glasses of wine per week  . Drug Use: No  . Sexually Active: No   Other Topics Concern  . Not on file   Social History Narrative  . No narrative on file    Current Outpatient Prescriptions on File Prior to Visit  Medication Sig Dispense Refill  . chlorthalidone (HYGROTON) 25 MG tablet Take 1 tablet (25 mg total) by mouth daily.  30 tablet  5  . clopidogrel (PLAVIX) 75 MG tablet Take 1 tablet (75 mg total) by mouth daily. With a meal  Generic please  90 tablet  3  . fluocinonide (LIDEX) 0.05 % gel Apply topically daily as needed.       . ramipril (ALTACE) 10 MG capsule Take 10 mg by mouth 2 (two) times daily.        . rosuvastatin (CRESTOR) 40 MG tablet Take 1 tablet (40 mg total) by mouth daily.  90 tablet  3    No Known Allergies  Review of  Systems  Review of Systems  Constitutional: Negative for fever and malaise/fatigue.  HENT: Negative for congestion.   Eyes: Negative for discharge.  Respiratory: Negative for shortness of breath.   Cardiovascular: Negative for chest pain, palpitations and leg swelling.  Gastrointestinal: Negative for nausea, abdominal pain and diarrhea.  Genitourinary: Negative for dysuria.  Musculoskeletal: Negative for falls.  Skin: Negative for rash.  Neurological: Negative for loss of consciousness and headaches.  Endo/Heme/Allergies: Negative for polydipsia.  Psychiatric/Behavioral: Negative for depression and suicidal ideas. The patient is not nervous/anxious and does not have insomnia.     Objective  BP 125/81  Pulse 65  Temp(Src) 97.5 F (36.4 C) (Oral)  Ht 6\' 2"  (1.88 m)  Wt 217 lb 12.8 oz (98.793 kg)  BMI 27.96 kg/m2  SpO2 100%  Physical Exam  Physical Exam  Constitutional: He is oriented to person,  place, and time and well-developed, well-nourished, and in no distress. No distress.  HENT:  Head: Normocephalic and atraumatic.  Eyes: Conjunctivae are normal.  Neck: Neck supple. No thyromegaly present.  Cardiovascular: Normal rate, regular rhythm and normal heart sounds.   No murmur heard. Pulmonary/Chest: Effort normal and breath sounds normal. No respiratory distress.  Abdominal: He exhibits no distension and no mass. There is no tenderness.  Musculoskeletal: He exhibits no edema.  Neurological: He is alert and oriented to person, place, and time.  Skin: Skin is warm.  Psychiatric: Memory, affect and judgment normal.    Lab Results  Component Value Date   TSH 9.48* 06/04/2011   Lab Results  Component Value Date   WBC 4.8 06/04/2011   HGB 12.8* 06/04/2011   HCT 38.6* 06/04/2011   MCV 96.7 06/04/2011   PLT Comment: Platelet clumps noted on smear-count appears adequate. 06/04/2011   Lab Results  Component Value Date   CREATININE 1.1 06/04/2011   BUN 26* 06/04/2011   NA 139 06/04/2011   K 5.2* 06/04/2011   CL 102 06/04/2011   CO2 29 06/04/2011   Lab Results  Component Value Date   ALT 21 06/04/2011   AST 26 06/04/2011   ALKPHOS 47 06/04/2011   BILITOT 0.9 06/04/2011   Lab Results  Component Value Date   CHOL 160 06/04/2011   Lab Results  Component Value Date   HDL 50.30 06/04/2011   Lab Results  Component Value Date   LDLCALC 81 06/04/2011   Lab Results  Component Value Date   TRIG 143.0 06/04/2011   Lab Results  Component Value Date   CHOLHDL 3 06/04/2011     Assessment & Plan  Tubular adenoma of colon Discovered on recent colonoscopy. We'll need a new colonoscopy in roughly 3 years time unless he has bowel changes  ESSENTIAL HYPERTENSION, BENIGN Well controlled, no change to current meds  CEREBROVASCULAR ACCIDENT Continues to follow with neurology no further events  Anemia Mild encouraged leafy greens and intermittent servings of lean  red meat  CAROTID ARTERY OCCLUSION, WITH INFARCTION January will be the one year anniversry of his las Carotid doppler he is encouraged to follow up with neurology to continue having these done

## 2011-06-10 NOTE — Patient Instructions (Signed)
Preventative Care for Adults, Male A healthy lifestyle and preventative care can promote health and wellness. Preventative health guidelines for men include the following key practices:  A routine yearly physical is a good way to check with your caregiver about your health and preventative screening. It is a chance to share any concerns and updates on your health, and to receive a thorough exam.   Visit your dentist for a routine exam and preventative care every 6 months. Brush your teeth twice a day and floss once a day. Good oral hygiene prevents tooth decay and gum disease.   The frequency of eye exams is based on your age, health, family medical history, use of contact lenses, and other factors. Follow your caregiver's recommendations for frequency of eye exams.   Eat a healthy diet. Foods like vegetables, fruits, whole grains, low-fat dairy products, and lean protein foods contain the nutrients you need without too many calories. Decrease your intake of foods high in solid fats, added sugars, and salt. Eat the right amount of calories for you.Get information about a proper diet from your caregiver, if necessary.   Regular physical exercise is one of the most important things you can do for your health. Most adults should get at least 150 minutes of moderate-intensity exercise (any activity that increases your heart rate and causes you to sweat) each week. In addition, most adults need muscle-strengthening exercises on 2 or more days a week.   Maintain a healthy weight. The body mass index (BMI) is a screening tool to identify possible weight problems. It provides an estimate of body fat based on height and weight. Your caregiver can help determine your BMI, and can help you achieve or maintain a healthy weight.For adults 20 years and older:   A BMI below 18.5 is considered underweight.   A BMI of 18.5 to 24.9 is normal.   A BMI of 25 to 29.9 is considered overweight.   A BMI of 30 and  above is considered obese.   Maintain normal blood lipids and cholesterol levels by exercising and minimizing your intake of saturated fat. Eat a balanced diet with plenty of fruit and vegetables. Blood tests for lipids and cholesterol should begin at age 20 and be repeated every 5 years. If your lipid or cholesterol levels are high, you are over 50, or you are a high risk for heart disease, you may need your cholesterol levels checked more frequently.Ongoing high lipid and cholesterol levels should be treated with medicines if diet and exercise are not effective.   If you smoke, find out from your caregiver how to quit. If you do not use tobacco, do not start.   If you choose to drink alcohol, do not exceed 2 drinks per day. One drink is considered to be 12 ounces (355 mL) of beer, 5 ounces (148 mL) of wine, or 1.5 ounces (44 mL) of liquor.   Avoid use of street drugs. Do not share needles with anyone. Ask for help if you need support or instructions about stopping the use of drugs.   High blood pressure causes heart disease and increases the risk of stroke. Your blood pressure should be checked at least every 1 to 2 years. Ongoing high blood pressure should be treated with medicines, if weight loss and exercise are not effective.   If you are 45 to 74 years old, ask your caregiver if you should take aspirin to prevent heart disease.   Diabetes screening involves taking a blood   sample to check your fasting blood sugar level. This should be done once every 3 years, after age 45, if you are within normal weight and without risk factors for diabetes. Testing should be considered at a younger age or be carried out more frequently if you are overweight and have at least 1 risk factor for diabetes.   Colorectal cancer can be detected and often prevented. Most routine colorectal cancer screening begins at the age of 50 and continues through age 75. However, your caregiver may recommend screening at an  earlier age if you have risk factors for colon cancer. On a yearly basis, your caregiver may provide home test kits to check for hidden blood in the stool. Use of a small camera at the end of a tube, to directly examine the colon (sigmoidoscopy or colonoscopy), can detect the earliest forms of colorectal cancer. Talk to your caregiver about this at age 50, when routine screening begins. Direct examination of the colon should be repeated every 5 to 10 years through age 75, unless early forms of pre-cancerous polyps or small growths are found.   Practice safe sex. Use condoms and avoid high-risk sexual practices to reduce the spread of sexually transmitted infections (STIs). STIs include gonorrhea, chlamydia, syphilis, trichomonas, herpes, HPV, and human immunodeficiency virus (HIV). Herpes, HIV, and HPV are viral illnesses that have no cure. They can result in disability, cancer, and death.   A one-time screening for abdominal aortic aneurysm (AAA) and surgical repair of large AAAs by sound wave imaging (ultrasonography) is recommended for ages 65 to 75 years who are current or former smokers.   Healthy men should no longer receive prostate-specific antigen (PSA) blood tests as part of routine cancer screening. Consult with your caregiver about prostate cancer screening.   Use sunscreen with skin protection factor (SPF) of 30 or more. Apply sunscreen liberally and repeatedly throughout the day. You should seek shade when your shadow is shorter than you. Protect yourself by wearing long sleeves, pants, a wide-brimmed hat, and sunglasses year round, whenever you are outdoors.   Once a month, do a whole body skin exam, using a mirror to look at the skin on your back. Notify your caregiver of new moles, moles that have irregular borders, moles that are larger than a pencil eraser, or moles that have changed in shape or color.   Stay current with required immunizations.   Influenza. You need a dose every  fall (or winter). The composition of the flu vaccine changes each year, so being vaccinated once is not enough.   Pneumococcal polysaccharide. You need 1 to 2 doses if you smoke cigarettes or if you have certain chronic medical conditions. You need 1 dose at age 65 (or older) if you have never been vaccinated.   Tetanus, diphtheria, pertussis (Tdap, Td). Get 1 dose of Tdap vaccine if you are younger than age 65 years, are over 65 and have contact with an infant, are a healthcare worker, or simply want to be protected from whooping cough. After that, you need a Td booster dose every 10 years. Consult your caregiver if you have not had at least 3 tetanus and diphtheria-containing shots sometime in your life or have a deep or dirty wound.   HPV. This vaccine is recommended for males 13 through 74 years of age. This vaccine may be given to men 22 through 74 years of age who have not completed the 3 dose series. It is recommended for men through age 26   who have sex with men or whose immune system is weakened because of HIV infection, other illness, or medications. The vaccine is given in 3 doses over 6 months.   Measles, mumps, rubella (MMR). You need at least 1 dose of MMR if you were born in 1957 or later. You may also need a 2nd dose.   Meningococcal. If you are age 19 to 21 years and a first-year college student living in a residence hall, or have one of several medical conditions, you need to get vaccinated against meningococcal disease. You may also need additional booster doses.   Zoster (shingles). If you are age 60 years or older, you should get this vaccine.   Varicella (chickenpox). If you have never had chickenpox or you were vaccinated but received only 1 dose, talk to your caregiver to find out if you need this vaccine.   Hepatitis A. You need this vaccine if you have a specific risk factor for hepatitis A virus infection, or you simply wish to be protected from this disease. The vaccine is  usually given as 2 doses, 6 to 18 months apart.   Hepatitis B. You need this vaccine if you have a specific risk factor for hepatitis B virus infection or you simply wish to be protected from this disease. The vaccine is given in 3 doses, usually over 6 months.  Preventative Service / Frequency Ages 19 to 39  Blood pressure check.** / Every 1 to 2 years.   Lipid and cholesterol check.**/ Every 5 years beginning at age 20.   Skin self-exam. / Monthly.   Influenza immunization.** / Every year.   Pneumococcal polysaccharide immunization.** / 1 to 2 doses if you smoke cigarettes or if you have certain chronic medical conditions.   Tetanus, diphtheria, pertussis (Tdap,Td) immunization. / A one-time dose of Tdap vaccine. After that, you need a Td booster dose every 10 years.   HPV immunization. / 3 doses over 6 months, if 26 and younger.   Measles, mumps, rubella (MMR) immunization. / You need at least 1 dose of MMR if you were born in 1957 or later. You may also need a 2nd dose.   Meningococcal immunization. / 1 dose if you are age 19 to 21 years and a first-year college student living in a residence hall, or have one of several medical conditions, you need to get vaccinated against meningococcal disease. You may also need additional booster doses.   Varicella immunization. **/ Consult your caregiver.   Hepatitis A immunization. ** / Consult your caregiver. 2 doses, 6 to 18 months apart.   Hepatitis B immunization.** / Consult your caregiver. 3 doses usually over 6 months.  Ages 40 to 64  Blood pressure check.** / Every 1 to 2 years.   Lipid and cholesterol check.**/ Every 5 years beginning at age 20.   Fecal occult blood test (FOBT) of stool. / Every year beginning at age 50 and continuing until age 75. You may not have to do this test if you get colonoscopy every 10 years.   Flexible sigmoidoscopy** or colonoscopy.** / Every 5 years for a flexible sigmoidoscopy or every 10 years for  a colonoscopy beginning at age 50 and continuing until age 75.   Skin self-exam. / Monthly.   Influenza immunization.** / Every year.   Pneumococcal polysaccharide immunization.** / 1 to 2 doses if you smoke cigarettes or if you have certain chronic medical conditions.   Tetanus, diphtheria, pertussis (Tdap/Td) immunization.** / A one-time dose of   Tdap vaccine. After that, you need a Td booster dose every 10 years.   Measles, mumps, rubella (MMR) immunization. / You need at least 1 dose of MMR if you were born in 1957 or later. You may also need a 2nd dose.   Varicella immunization. **/ Consult your caregiver.   Meningococcal immunization.** / Consult your caregiver.   Hepatitis A immunization. ** / Consult your caregiver. 2 doses, 6 to 18 months apart.   Hepatitis B immunization.** / Consult your caregiver. 3 doses, usually over 6 months.  Ages 65 and over  Blood pressure check.** / Every 1 to 2 years.   Lipid and cholesterol check.**/ Every 5 years beginning at age 20.   Fecal occult blood test (FOBT) of stool. / Every year beginning at age 50 and continuing until age 75. You may not have to do this test if you get colonoscopy every 10 years.   Flexible sigmoidoscopy** or colonoscopy.** / Every 5 years for a flexible sigmoidoscopy or every 10 years for a colonoscopy beginning at age 50 and continuing until age 75.   Abdominal aortic aneurysm (AAA) screening.** / A one-time screening for ages 65 to 75 years who are current or former smokers.   Skin self-exam. / Monthly.   Influenza immunization.** / Every year.   Pneumococcal polysaccharide immunization.** / 1 dose at age 65 (or older) if you have never been vaccinated.   Tetanus, diphtheria, pertussis (Tdap, Td) immunization. / A one-time dose of Tdap vaccine if you are over 65 and have contact with an infant, are a healthcare worker, or simply want to be protected from whooping cough. After that, you need a Td booster dose  every 10 years.   Varicella immunization. **/ Consult your caregiver.   Meningococcal immunization.** / Consult your caregiver.   Hepatitis A immunization. ** / Consult your caregiver. 2 doses, 6 to 18 months apart.   Hepatitis B immunization.** / Check with your caregiver. 3 doses, usually over 6 months.  **Family history and personal history of risk and conditions may change your caregiver's recommendations. Document Released: 09/01/2001 Document Revised: 03/18/2011 Document Reviewed: 12/01/2010 [employer] Patient Information 2012 [employer]. 

## 2011-06-11 NOTE — Assessment & Plan Note (Signed)
Mild encouraged leafy greens and intermittent servings of lean red meat

## 2011-06-11 NOTE — Assessment & Plan Note (Signed)
January will be the one year anniversry of his las Carotid doppler he is encouraged to follow up with neurology to continue having these done

## 2011-06-11 NOTE — Assessment & Plan Note (Signed)
Well controlled, no change to current meds

## 2011-06-11 NOTE — Assessment & Plan Note (Signed)
Continues to follow with neurology no further events

## 2011-06-11 NOTE — Assessment & Plan Note (Signed)
Discovered on recent colonoscopy. We'll need a new colonoscopy in roughly 3 years time unless he has bowel changes

## 2011-06-25 ENCOUNTER — Other Ambulatory Visit: Payer: Self-pay | Admitting: Dermatology

## 2011-07-22 ENCOUNTER — Other Ambulatory Visit: Payer: Self-pay

## 2011-07-22 DIAGNOSIS — I6350 Cerebral infarction due to unspecified occlusion or stenosis of unspecified cerebral artery: Secondary | ICD-10-CM

## 2011-07-22 DIAGNOSIS — E782 Mixed hyperlipidemia: Secondary | ICD-10-CM

## 2011-07-22 MED ORDER — ROSUVASTATIN CALCIUM 40 MG PO TABS: 40.0000 mg | ORAL_TABLET | Freq: Every day | ORAL | Status: DC

## 2011-07-22 MED ORDER — RAMIPRIL 10 MG PO CAPS: 10.0000 mg | ORAL_CAPSULE | Freq: Two times a day (BID) | ORAL | Status: DC

## 2011-07-22 MED ORDER — CLOPIDOGREL BISULFATE 75 MG PO TABS: 75.0000 mg | ORAL_TABLET | Freq: Every day | ORAL | Status: DC

## 2011-07-22 MED ORDER — CHLORTHALIDONE 25 MG PO TABS: 25.0000 mg | ORAL_TABLET | Freq: Every day | ORAL | Status: DC

## 2011-12-01 ENCOUNTER — Other Ambulatory Visit (INDEPENDENT_AMBULATORY_CARE_PROVIDER_SITE_OTHER): Payer: Medicare Other

## 2011-12-01 DIAGNOSIS — R351 Nocturia: Secondary | ICD-10-CM

## 2011-12-01 DIAGNOSIS — I10 Essential (primary) hypertension: Secondary | ICD-10-CM

## 2011-12-01 LAB — RENAL FUNCTION PANEL
Albumin: 4 g/dL (ref 3.5–5.2)
BUN: 27 mg/dL — ABNORMAL HIGH (ref 6–23)
CO2: 28 mEq/L (ref 19–32)
Calcium: 9.4 mg/dL (ref 8.4–10.5)
Creatinine, Ser: 1.2 mg/dL (ref 0.4–1.5)
Glucose, Bld: 89 mg/dL (ref 70–99)
Sodium: 138 mEq/L (ref 135–145)

## 2011-12-01 LAB — HEPATIC FUNCTION PANEL
Albumin: 4 g/dL (ref 3.5–5.2)
Alkaline Phosphatase: 44 U/L (ref 39–117)
Total Protein: 6.9 g/dL (ref 6.0–8.3)

## 2011-12-01 LAB — LIPID PANEL
Cholesterol: 149 mg/dL (ref 0–200)
HDL: 43.2 mg/dL (ref >39.00)
LDL Cholesterol: 84 mg/dL (ref 0–99)
Triglycerides: 108 mg/dL (ref 0.0–149.0)
VLDL: 21.6 mg/dL (ref 0.0–40.0)

## 2011-12-01 LAB — CBC
HCT: 38.2 % — ABNORMAL LOW (ref 39.0–52.0)
Hemoglobin: 12.8 g/dL — ABNORMAL LOW (ref 13.0–17.0)
MCHC: 33.4 g/dL (ref 30.0–36.0)
MCV: 97.7 fl (ref 78.0–100.0)
RDW: 15.9 % — ABNORMAL HIGH (ref 11.5–14.6)

## 2011-12-01 LAB — PSA: PSA: 0.26 ng/mL (ref 0.10–4.00)

## 2011-12-02 LAB — T4, FREE: Free T4: 0.57 ng/dL — ABNORMAL LOW (ref 0.60–1.60)

## 2011-12-03 MED ORDER — LEVOTHYROXINE SODIUM 25 MCG PO TABS: 25.0000 ug | ORAL_TABLET | Freq: Every day | ORAL | Status: DC

## 2011-12-08 ENCOUNTER — Ambulatory Visit (INDEPENDENT_AMBULATORY_CARE_PROVIDER_SITE_OTHER): Payer: Medicare Other | Admitting: Family Medicine

## 2011-12-08 ENCOUNTER — Encounter: Payer: Self-pay | Admitting: Family Medicine

## 2011-12-08 ENCOUNTER — Telehealth: Payer: Self-pay

## 2011-12-08 VITALS — BP 136/75 | HR 60 | Temp 98.1°F | Ht 74.0 in | Wt 226.8 lb

## 2011-12-08 DIAGNOSIS — E782 Mixed hyperlipidemia: Secondary | ICD-10-CM

## 2011-12-08 DIAGNOSIS — C4491 Basal cell carcinoma of skin, unspecified: Secondary | ICD-10-CM

## 2011-12-08 DIAGNOSIS — D649 Anemia, unspecified: Secondary | ICD-10-CM

## 2011-12-08 DIAGNOSIS — I10 Essential (primary) hypertension: Secondary | ICD-10-CM

## 2011-12-08 DIAGNOSIS — E039 Hypothyroidism, unspecified: Secondary | ICD-10-CM

## 2011-12-08 HISTORY — DX: Basal cell carcinoma of skin, unspecified: C44.91

## 2011-12-08 HISTORY — DX: Hypothyroidism, unspecified: E03.9

## 2011-12-08 NOTE — Telephone Encounter (Signed)
FYI: Pt left  A message stating to relay this message to MD. Pt is having surgery on his ear on 12-24-11 at 7:30 am w/ Dr Claria Dice at [facility]

## 2011-12-08 NOTE — Assessment & Plan Note (Signed)
He was referred to dermatology where they confirmed a BCC on his right pinna, he was then referred to a surgeon who took a biopsy which did not confirm BCC and the area was clear at that time. He was told to call back for an appt. Today on exam he has a scaly patch on his pinna. He is now scheduled for surgery 6/6. Has a new scaly patch on left forehead as well. Will discuss this with dermatology

## 2011-12-08 NOTE — Assessment & Plan Note (Signed)
Recent lab work and patient's symptoms confirm. Has just started Levothyroxine, will check levels in 8 weeks

## 2011-12-08 NOTE — Assessment & Plan Note (Signed)
Adequately controlled, no changes 

## 2011-12-08 NOTE — Progress Notes (Signed)
Patient ID: Tanner [name], male   DOB: January 24, 1935, 76 y.o.   MRN: [id number] Tanner [name] [id number] 07-26-1934 12/08/2011      Progress Note-Follow Up  Subjective  Chief Complaint  Chief Complaint  Patient presents with  . Follow-up    6 month     HPI  Patient is a 77 year old Caucasian male who is in today for followup. He notes that he is having trouble with increasing fatigue. He is frustrated with weight gain. He is also noting some constipation finding is moving his bowels less often and having to strain to move her bowels. He's had a recent colonoscopy which was clear. He's not had any significant change in his diet. He denies any acute illness, fevers, chills, chest pain palpitations or shortness of breath. He recently has been seen by dermatology who confirmed basal cell carcinoma on his right ear near having trouble pinpoint the spot the need to excise it today he does note a mildly scaly patch on the top of his right pinna. No allergic symptoms, headaches or neurologic complaints are noted today. No urinary complaints.  Past Medical History  Diagnosis Date  . Allergy     seasonal- mostly spring  . TOBACCO ABUSE, HX OF 03/17/2010  . Overweight 07/01/2010  . Personal history of other infectious and parasitic disease 03/17/2010  . Mixed hyperlipidemia 03/17/2010  . HYPERKALEMIA 05/27/2010  . FATIGUE 03/17/2010  . CHICKENPOX, HX OF 03/17/2010  . CEREBROVASCULAR ACCIDENT 03/17/2010  . CAROTID ARTERY OCCLUSION, WITH INFARCTION 03/17/2010  . ASTHMA, CHILDHOOD 03/17/2010  . Hearing loss 11/04/2010    bilateral  . Anemia 02/03/2011  . Hypertension   . Tubular adenoma of colon 06/10/2011  . Hypothyroid 12/08/2011  . BCC (basal cell carcinoma) 12/08/2011    Past Surgical History  Procedure Date  . Tonsillectomy   . Pilonidal cyst excision 1968    removed  . Knee cartliage repair 1964, 1990    bilateral  . Rotator cuff repair     right    Family History  Problem Relation  Age of Onset  . Alzheimer's disease Mother   . Emphysema Sister     cigarettes  . Heart disease Son   . Coronary artery disease Brother   . Heart attack Brother   . Other Brother     heart problems- from fathers side  . Cancer Sister     gyn  . Bipolar disorder Daughter   . Obesity Daughter     History   Social History  . Marital Status: Divorced    Spouse Name: N/A    Number of Children: 3  . Years of Education: N/A   Occupational History  . retired    Social History Main Topics  . Smoking status: Former Smoker -- 0.5 packs/day    Types: Cigarettes    Quit date: 07/20/1976  . Smokeless tobacco: Never Used  . Alcohol Use: 4.2 oz/week    7 Glasses of wine per week  . Drug Use: No  . Sexually Active: No   Other Topics Concern  . Not on file   Social History Narrative  . No narrative on file    Current Outpatient Prescriptions on File Prior to Visit  Medication Sig Dispense Refill  . clopidogrel (PLAVIX) 75 MG tablet Take 1 tablet (75 mg total) by mouth daily. With a meal  Generic please  90 tablet  2  . fluocinonide (LIDEX) 0.05 % gel Apply topically daily as needed.       [name]  levothyroxine (LEVOTHROID) 25 MCG tablet Take 1 tablet (25 mcg total) by mouth daily.  90 tablet  0  . ramipril (ALTACE) 10 MG capsule Take 1 capsule (10 mg total) by mouth 2 (two) times daily.  180 capsule  2  . rosuvastatin (CRESTOR) 40 MG tablet Take 1 tablet (40 mg total) by mouth daily.  90 tablet  2  . chlorthalidone (HYGROTON) 25 MG tablet Take 1 tablet (25 mg total) by mouth daily.  90 tablet  2    No Known Allergies  Review of Systems  Review of Systems  Constitutional: Positive for malaise/fatigue. Negative for fever.       Weight gain  HENT: Negative for congestion.   Eyes: Negative for discharge.  Respiratory: Negative for shortness of breath.   Cardiovascular: Negative for chest pain, palpitations and leg swelling.  Gastrointestinal: Positive for constipation. Negative  for nausea, abdominal pain and diarrhea.  Genitourinary: Negative for dysuria.  Musculoskeletal: Negative for falls.  Skin: Negative for rash.  Neurological: Negative for loss of consciousness and headaches.  Endo/Heme/Allergies: Negative for polydipsia.  Psychiatric/Behavioral: Negative for depression and suicidal ideas. The patient is not nervous/anxious and does not have insomnia.     Objective  BP 136/75  Pulse 60  Temp(Src) 98.1 F (36.7 C) (Temporal)  Ht 6\' 2"  (1.88 m)  Wt 226 lb 12.8 oz (102.876 kg)  BMI 29.12 kg/m2  SpO2 99%  Physical Exam  Physical Exam  Constitutional: He is oriented to person, place, and time and well-developed, well-nourished, and in no distress. No distress.  HENT:  Head: Normocephalic and atraumatic.  Eyes: Conjunctivae are normal.  Neck: Neck supple. No thyromegaly present.  Cardiovascular: Normal rate, regular rhythm and normal heart sounds.   No murmur heard. Pulmonary/Chest: Effort normal and breath sounds normal. No respiratory distress.  Abdominal: He exhibits no distension and no mass. There is no tenderness.  Musculoskeletal: He exhibits no edema.  Neurological: He is alert and oriented to person, place, and time.  Skin: Skin is warm.  Psychiatric: Memory, affect and judgment normal.    Lab Results  Component Value Date   TSH 12.06* 12/01/2011   Lab Results  Component Value Date   WBC 5.3 12/01/2011   HGB 12.8* 12/01/2011   HCT 38.2* 12/01/2011   MCV 97.7 12/01/2011   PLT 144.0* 12/01/2011   Lab Results  Component Value Date   CREATININE 1.2 12/01/2011   BUN 27* 12/01/2011   NA 138 12/01/2011   K 4.6 12/01/2011   CL 101 12/01/2011   CO2 28 12/01/2011   Lab Results  Component Value Date   ALT 19 12/01/2011   AST 22 12/01/2011   ALKPHOS 44 12/01/2011   BILITOT 0.6 12/01/2011   Lab Results  Component Value Date   CHOL 149 12/01/2011   Lab Results  Component Value Date   HDL 43.20 12/01/2011   Lab Results  Component Value  Date   LDLCALC 84 12/01/2011   Lab Results  Component Value Date   TRIG 108.0 12/01/2011   Lab Results  Component Value Date   CHOLHDL 3 12/01/2011     Assessment & Plan  BCC (basal cell carcinoma) He was referred to dermatology where they confirmed a BCC on his right pinna, he was then referred to a surgeon who took a biopsy which did not confirm BCC and the area was clear at that time. He was told to call back for an appt. Today on exam he has a scaly  patch on his pinna. He is now scheduled for surgery 6/6. Has a new scaly patch on left forehead as well. Will discuss this with dermatology  Hypothyroid Recent lab work and patient's symptoms confirm. Has just started Levothyroxine, will check levels in 8 weeks   ESSENTIAL HYPERTENSION, BENIGN Adequately controlled, no changes  MIXED HYPERLIPIDEMIA Good response to Crestor given more samples today  Anemia Mild but persistent with increased MCV consider pernicious anemia, will check intrinsic factor, methylmalanoic acid, homocysteine level at next blood draw. Start a Vitamin B complex while waiting

## 2011-12-08 NOTE — Patient Instructions (Addendum)
Constipation in Adults Constipation is having fewer than 2 bowel movements per week. Usually, the stools are hard. As we grow older, constipation is more common. If you try to fix constipation with laxatives, the problem may get worse. This is because laxatives taken over a long period of time make the colon muscles weaker. A low-fiber diet, not taking in enough fluids, and taking some medicines may make these problems worse. MEDICATIONS THAT MAY CAUSE CONSTIPATION  Water pills (diuretics).   Calcium channel blockers (used to control blood pressure and for the heart).   Certain pain medicines (narcotics).   Anticholinergics.   Anti-inflammatory agents.   Antacids that contain aluminum.  DISEASES THAT CONTRIBUTE TO CONSTIPATION  Diabetes.   Parkinson's disease.   Dementia.   Stroke.   Depression.   Illnesses that cause problems with salt and water metabolism.  HOME CARE INSTRUCTIONS   Constipation is usually best cared for without medicines. Increasing dietary fiber and eating more fruits and vegetables is the best way to manage constipation.   Slowly increase fiber intake to 25 to 38 grams per day. Whole grains, fruits, vegetables, and legumes are good sources of fiber. A dietitian can further help you incorporate high-fiber foods into your diet.   Drink enough water and fluids to keep your urine clear or pale yellow.   A fiber supplement may be added to your diet if you cannot get enough fiber from foods.   Increasing your activities also helps improve regularity.   Suppositories, as suggested by your caregiver, will also help. If you are using antacids, such as aluminum or calcium containing products, it will be helpful to switch to products containing magnesium if your caregiver says it is okay.   If you have been given a liquid injection (enema) today, this is only a temporary measure. It should not be relied on for treatment of longstanding (chronic) constipation.    Stronger measures, such as magnesium sulfate, should be avoided if possible. This may cause uncontrollable diarrhea. Using magnesium sulfate may not allow you time to make it to the bathroom.  SEEK IMMEDIATE MEDICAL CARE IF:   There is bright red blood in the stool.   The constipation stays for more than 4 days.   There is belly (abdominal) or rectal pain.   You do not seem to be getting better.   You have any questions or concerns.  MAKE SURE YOU:   Understand these instructions.   Will watch your condition.   Will get help right away if you are not doing well or get worse.  Document Released: 04/03/2004 Document Revised: 06/25/2011 Document Reviewed: 06/09/2011 [facility] Patient Information 2012 [place], [place].Hypothyroidism The thyroid is a large gland located in the lower front of your neck. The thyroid gland helps control metabolism. Metabolism is how your body handles food. It controls metabolism with the hormone thyroxine. When this gland is underactive (hypothyroid), it produces too little hormone.  CAUSES These include:   Absence or destruction of thyroid tissue.   Goiter due to iodine deficiency.   Goiter due to medications.   Congenital defects (since birth).   Problems with the pituitary. This causes a lack of TSH (thyroid stimulating hormone). This hormone tells the thyroid to turn out more hormone.  SYMPTOMS  Lethargy (feeling as though you have no energy)   Cold intolerance   Weight gain (in spite of normal food intake)   Dry skin   Coarse hair   Menstrual irregularity (if severe, may lead  to infertility)   Slowing of thought processes  Cardiac problems are also caused by insufficient amounts of thyroid hormone. Hypothyroidism in the newborn is cretinism, and is an extreme form. It is important that this form be treated adequately and immediately or it will lead rapidly to retarded physical and mental development. DIAGNOSIS  To prove  hypothyroidism, your caregiver may do blood tests and ultrasound tests. Sometimes the signs are hidden. It may be necessary for your caregiver to watch this illness with blood tests either before or after diagnosis and treatment. TREATMENT  Low levels of thyroid hormone are increased by using synthetic thyroid hormone. This is a safe, effective treatment. It usually takes about four weeks to gain the full effects of the medication. After you have the full effect of the medication, it will generally take another four weeks for problems to leave. Your caregiver may start you on low doses. If you have had heart problems the dose may be gradually increased. It is generally not an emergency to get rapidly to normal. HOME CARE INSTRUCTIONS   Take your medications as your caregiver suggests. Let your caregiver know of any medications you are taking or start taking. Your caregiver will help you with dosage schedules.   As your condition improves, your dosage needs may increase. It will be necessary to have continuing blood tests as suggested by your caregiver.   Report all suspected medication side effects to your caregiver.  SEEK MEDICAL CARE IF: Seek medical care if you develop:  Sweating.   Tremulousness (tremors).   Anxiety.   Rapid weight loss.   Heat intolerance.   Emotional swings.   Diarrhea.   Weakness.  SEEK IMMEDIATE MEDICAL CARE IF:  You develop chest pain, an irregular heart beat (palpitations), or a rapid heart beat. MAKE SURE YOU:   Understand these instructions.   Will watch your condition.   Will get help right away if you are not doing well or get worse.  Document Released: 07/06/2005 Document Revised: 06/25/2011 Document Reviewed: 02/24/2008 [facility] Patient Information 2012 [place], [place].   For the constiption increase fluids to 64 oz. Add Metamucil bid, with fluids behind  Consider MegaRed caps daily to replace fish oil by Schiff   Add a Vitamin B complex  daily  If really get Constipation. 4 oz of warm prune with 2 tbls of MOM, may repeat in 2-4 hours with a Dulcolax suppository at the same time

## 2011-12-08 NOTE — Assessment & Plan Note (Signed)
Mild but persistent with increased MCV consider pernicious anemia, will check intrinsic factor, methylmalanoic acid, homocysteine level at next blood draw. Start a Vitamin B complex while waiting

## 2011-12-08 NOTE — Assessment & Plan Note (Signed)
Good response to Crestor given more samples today

## 2012-01-01 ENCOUNTER — Telehealth: Payer: Self-pay | Admitting: Emergency Medicine

## 2012-01-01 MED ORDER — LEVOTHYROXINE SODIUM 25 MCG PO TABS: 25.0000 ug | ORAL_TABLET | Freq: Every day | ORAL | Status: DC

## 2012-01-01 NOTE — Telephone Encounter (Signed)
Pt needs a refill for Levothyroxine

## 2012-02-02 ENCOUNTER — Other Ambulatory Visit (INDEPENDENT_AMBULATORY_CARE_PROVIDER_SITE_OTHER): Payer: Medicare Other

## 2012-02-02 DIAGNOSIS — E039 Hypothyroidism, unspecified: Secondary | ICD-10-CM

## 2012-02-02 DIAGNOSIS — D649 Anemia, unspecified: Secondary | ICD-10-CM

## 2012-02-02 LAB — CBC
HCT: 38.1 % — ABNORMAL LOW (ref 39.0–52.0)
RBC: 3.87 Mil/uL — ABNORMAL LOW (ref 4.22–5.81)
RDW: 16 % — ABNORMAL HIGH (ref 11.5–14.6)
WBC: 4.8 10*3/uL (ref 4.5–10.5)

## 2012-02-03 LAB — HOMOCYSTEINE: Homocysteine: 11.9 umol/L (ref 4.0–15.4)

## 2012-02-04 LAB — INTRINSIC FACTOR ANTIBODIES: Intrinsic Factor: NEGATIVE

## 2012-03-29 ENCOUNTER — Other Ambulatory Visit (INDEPENDENT_AMBULATORY_CARE_PROVIDER_SITE_OTHER): Payer: Medicare Other

## 2012-03-29 DIAGNOSIS — I10 Essential (primary) hypertension: Secondary | ICD-10-CM

## 2012-03-29 DIAGNOSIS — D649 Anemia, unspecified: Secondary | ICD-10-CM

## 2012-03-29 DIAGNOSIS — Z79899 Other long term (current) drug therapy: Secondary | ICD-10-CM

## 2012-03-29 LAB — HEPATIC FUNCTION PANEL
ALT: 16 U/L (ref 0–53)
Bilirubin, Direct: 0 mg/dL (ref 0.0–0.3)
Total Bilirubin: 0.5 mg/dL (ref 0.3–1.2)
Total Protein: 6.8 g/dL (ref 6.0–8.3)

## 2012-03-29 LAB — CBC
HCT: 35.7 % — ABNORMAL LOW (ref 39.0–52.0)
MCHC: 33.4 g/dL (ref 30.0–36.0)
MCV: 97.4 fl (ref 78.0–100.0)
Platelets: 126 10*3/uL — ABNORMAL LOW (ref 150.0–400.0)
RBC: 3.66 Mil/uL — ABNORMAL LOW (ref 4.22–5.81)

## 2012-03-29 LAB — RENAL FUNCTION PANEL
Albumin: 4.1 g/dL (ref 3.5–5.2)
BUN: 21 mg/dL (ref 6–23)
CO2: 27 mEq/L (ref 19–32)
Chloride: 104 mEq/L (ref 96–112)
Creatinine, Ser: 1.1 mg/dL (ref 0.4–1.5)

## 2012-03-29 LAB — LIPID PANEL
Cholesterol: 136 mg/dL (ref 0–200)
HDL: 48.7 mg/dL (ref >39.00)
LDL Cholesterol: 68 mg/dL (ref 0–99)
Total CHOL/HDL Ratio: 3
Triglycerides: 99 mg/dL (ref 0.0–149.0)

## 2012-04-04 ENCOUNTER — Other Ambulatory Visit: Payer: Self-pay | Admitting: Family Medicine

## 2012-04-04 DIAGNOSIS — I6350 Cerebral infarction due to unspecified occlusion or stenosis of unspecified cerebral artery: Secondary | ICD-10-CM

## 2012-04-04 DIAGNOSIS — E782 Mixed hyperlipidemia: Secondary | ICD-10-CM

## 2012-04-04 MED ORDER — LEVOTHYROXINE SODIUM 25 MCG PO TABS: 25.0000 ug | ORAL_TABLET | Freq: Every day | ORAL | Status: DC

## 2012-04-04 MED ORDER — CHLORTHALIDONE 25 MG PO TABS: 25.0000 mg | ORAL_TABLET | Freq: Every day | ORAL | Status: DC

## 2012-04-04 MED ORDER — ROSUVASTATIN CALCIUM 40 MG PO TABS: 40.0000 mg | ORAL_TABLET | Freq: Every day | ORAL | Status: DC

## 2012-04-04 MED ORDER — CLOPIDOGREL BISULFATE 75 MG PO TABS: 75.0000 mg | ORAL_TABLET | Freq: Every day | ORAL | Status: DC

## 2012-04-04 MED ORDER — RAMIPRIL 10 MG PO CAPS: 10.0000 mg | ORAL_CAPSULE | Freq: Two times a day (BID) | ORAL | Status: DC

## 2012-04-05 ENCOUNTER — Encounter: Payer: Self-pay | Admitting: Family Medicine

## 2012-04-05 ENCOUNTER — Ambulatory Visit (INDEPENDENT_AMBULATORY_CARE_PROVIDER_SITE_OTHER): Payer: Medicare Other | Admitting: Family Medicine

## 2012-04-05 VITALS — BP 146/83 | HR 64 | Temp 97.5°F | Ht 74.0 in | Wt 226.4 lb

## 2012-04-05 DIAGNOSIS — D126 Benign neoplasm of colon, unspecified: Secondary | ICD-10-CM

## 2012-04-05 DIAGNOSIS — Z23 Encounter for immunization: Secondary | ICD-10-CM

## 2012-04-05 DIAGNOSIS — C4491 Basal cell carcinoma of skin, unspecified: Secondary | ICD-10-CM

## 2012-04-05 DIAGNOSIS — D649 Anemia, unspecified: Secondary | ICD-10-CM

## 2012-04-05 DIAGNOSIS — I6350 Cerebral infarction due to unspecified occlusion or stenosis of unspecified cerebral artery: Secondary | ICD-10-CM

## 2012-04-05 DIAGNOSIS — D696 Thrombocytopenia, unspecified: Secondary | ICD-10-CM

## 2012-04-05 DIAGNOSIS — I10 Essential (primary) hypertension: Secondary | ICD-10-CM

## 2012-04-05 DIAGNOSIS — E875 Hyperkalemia: Secondary | ICD-10-CM

## 2012-04-05 DIAGNOSIS — E039 Hypothyroidism, unspecified: Secondary | ICD-10-CM

## 2012-04-05 DIAGNOSIS — E782 Mixed hyperlipidemia: Secondary | ICD-10-CM

## 2012-04-05 HISTORY — DX: Thrombocytopenia, unspecified: D69.6

## 2012-04-05 NOTE — Assessment & Plan Note (Signed)
Resolved on lab results

## 2012-04-05 NOTE — Assessment & Plan Note (Signed)
Will proceed with iFOB testing. No change in stooling or bowel habits

## 2012-04-05 NOTE — Progress Notes (Signed)
Patient ID: Tanner [name], male   DOB: Jul 31, 1934, 77 y.o.   MRN: [id number] DREVAN ASBILL [id number] [birth year] 04/05/2012      Progress Note-Follow Up  Subjective  Chief Complaint  Chief Complaint  Patient presents with  . Annual Exam    Physical    HPI  Patient is a 77 year old Caucasian male who is in today for for annual followup on multiple medical events. He is doing well. He's had no recent cerebrovascular events. He denies any recent illness. No fevers, chills, chest pain, headache, shortness of breath, palpitations, GI or GU concerns. He is not exercising as diligently as he once did although he has made a new schedule to try and get to the gym 3 times a week. He continues to eat well most of the time awaiting transfer to eating complex carbohydrates. Takes fiber supplement, MegaRed and vitamin B12 supplement daily  Past Medical History  Diagnosis Date  . Allergy     seasonal- mostly spring  . TOBACCO ABUSE, HX OF 03/17/2010  . Overweight 07/01/2010  . Personal history of other infectious and parasitic disease 03/17/2010  . Mixed hyperlipidemia 03/17/2010  . HYPERKALEMIA 05/27/2010  . FATIGUE 03/17/2010  . CHICKENPOX, HX OF 03/17/2010  . CEREBROVASCULAR ACCIDENT 03/17/2010  . CAROTID ARTERY OCCLUSION, WITH INFARCTION 03/17/2010  . ASTHMA, CHILDHOOD 03/17/2010  . Hearing loss 11/04/2010    bilateral  . Anemia 02/03/2011  . Hypertension   . Tubular adenoma of colon 06/10/2011  . Hypothyroid 12/08/2011  . BCC (basal cell carcinoma) 12/08/2011  . Thrombocytopenia 04/05/2012    Past Surgical History  Procedure Date  . Tonsillectomy   . Pilonidal cyst excision 1968    removed  . Knee cartliage repair 1964, 1990    bilateral  . Rotator cuff repair     right    Family History  Problem Relation Age of Onset  . Alzheimer's disease Mother   . Emphysema Sister     cigarettes  . Heart disease Son   . Coronary artery disease Brother   . Heart attack Brother   . Other  Brother     heart problems- from fathers side  . Cancer Sister     gyn  . Bipolar disorder Daughter   . Obesity Daughter     History   Social History  . Marital Status: Divorced    Spouse Name: N/A    Number of Children: 3  . Years of Education: N/A   Occupational History  . retired    Social History Main Topics  . Smoking status: Former Smoker -- 0.5 packs/day    Types: Cigarettes    Quit date: 07/20/1976  . Smokeless tobacco: Never Used  . Alcohol Use: 4.2 oz/week    7 Glasses of wine per week  . Drug Use: No  . Sexually Active: No   Other Topics Concern  . Not on file   Social History Narrative  . No narrative on file    Current Outpatient Prescriptions on File Prior to Visit  Medication Sig Dispense Refill  . chlorthalidone (HYGROTON) 25 MG tablet Take 1 tablet (25 mg total) by mouth daily.  90 tablet  3  . clopidogrel (PLAVIX) 75 MG tablet Take 1 tablet (75 mg total) by mouth daily. With a meal  Generic please  90 tablet  3  . fluocinonide (LIDEX) 0.05 % gel Apply topically daily as needed.       [name] levothyroxine (LEVOTHROID) 25 MCG tablet Take 1  tablet (25 mcg total) by mouth daily.  90 tablet  3  . ramipril (ALTACE) 10 MG capsule Take 1 capsule (10 mg total) by mouth 2 (two) times daily.  180 capsule  3  . rosuvastatin (CRESTOR) 40 MG tablet Take 1 tablet (40 mg total) by mouth daily.  90 tablet  3    No Known Allergies  Review of Systems  Review of Systems  Constitutional: Negative for fever and malaise/fatigue.  HENT: Negative for congestion.   Eyes: Negative for discharge.  Respiratory: Negative for shortness of breath.   Cardiovascular: Negative for chest pain, palpitations and leg swelling.  Gastrointestinal: Negative for nausea, abdominal pain and diarrhea.  Genitourinary: Negative for dysuria.  Musculoskeletal: Negative for falls.  Skin: Negative for rash.  Neurological: Negative for loss of consciousness and headaches.  Endo/Heme/Allergies:  Negative for polydipsia.  Psychiatric/Behavioral: Negative for depression and suicidal ideas. The patient is not nervous/anxious and does not have insomnia.     Objective  BP 146/83  Pulse 64  Temp 97.5 F (36.4 C) (Temporal)  Ht 6\' 2"  (1.88 m)  Wt 226 lb 6.4 oz (102.694 kg)  BMI 29.07 kg/m2  SpO2 99%  Physical Exam  Physical Exam  Constitutional: He is oriented to person, place, and time and well-developed, well-nourished, and in no distress. No distress.  HENT:  Head: Normocephalic and atraumatic.  Eyes: Conjunctivae normal are normal.  Neck: Neck supple. No thyromegaly present.  Cardiovascular: Normal rate, regular rhythm and normal heart sounds.   No murmur heard. Pulmonary/Chest: Effort normal and breath sounds normal. No respiratory distress.  Abdominal: He exhibits no distension and no mass. There is no tenderness.  Musculoskeletal: He exhibits no edema.  Neurological: He is alert and oriented to person, place, and time.  Skin: Skin is warm.  Psychiatric: Memory, affect and judgment normal.    Lab Results  Component Value Date   TSH 4.19 03/29/2012   Lab Results  Component Value Date   WBC 5.2 03/29/2012   HGB 11.9* 03/29/2012   HCT 35.7* 03/29/2012   MCV 97.4 03/29/2012   PLT 126.0* 03/29/2012   Lab Results  Component Value Date   CREATININE 1.1 03/29/2012   BUN 21 03/29/2012   NA 138 03/29/2012   K 4.1 03/29/2012   CL 104 03/29/2012   CO2 27 03/29/2012   Lab Results  Component Value Date   ALT 16 03/29/2012   AST 30 03/29/2012   ALKPHOS 39 03/29/2012   BILITOT 0.5 03/29/2012   Lab Results  Component Value Date   CHOL 136 03/29/2012   Lab Results  Component Value Date   HDL 48.70 03/29/2012   Lab Results  Component Value Date   LDLCALC 68 03/29/2012   Lab Results  Component Value Date   TRIG 99.0 03/29/2012   Lab Results  Component Value Date   CHOLHDL 3 03/29/2012     Assessment & Plan  Tubular adenoma of colon Will proceed with iFOB testing.  No change in stooling or bowel habits  MIXED HYPERLIPIDEMIA Well controlled on Crestor, Metamucil and MegaRed, encouraged avoid trans fats, increase excercise  ESSENTIAL HYPERTENSION, BENIGN Encouraged DASH diet and increased exercise again, reassess at next visit or if symptoms occur that are concerning  CEREBROVASCULAR ACCIDENT No further episodes, continues to follow with Dr Pearlean Brownie and participate in clinical trial  HYPERKALEMIA Resolved on lab results  Hypothyroid Levothyroxine will remain at same dose  Anemia Persistent, slightly worse. Check iFOB, iron studies and retic count  with repeat cbc and if continues to worsen will need referral to heamtology  BCC (basal cell carcinoma) No recurrence  Thrombocytopenia Patient advised regarding signs of worsening and he will call if any concerns. Otherwise we will repeat cbc with next visit and continue to monitor

## 2012-04-05 NOTE — Assessment & Plan Note (Signed)
Patient advised regarding signs of worsening and he will call if any concerns. Otherwise we will repeat cbc with next visit and continue to monitor

## 2012-04-05 NOTE — Assessment & Plan Note (Signed)
Encouraged DASH diet and increased exercise again, reassess at next visit or if symptoms occur that are concerning

## 2012-04-05 NOTE — Assessment & Plan Note (Addendum)
Persistent, slightly worse. Check iFOB, iron studies and retic count with repeat cbc and if continues to worsen will need referral to heamtology

## 2012-04-05 NOTE — Assessment & Plan Note (Signed)
Levothyroxine will remain at same dose

## 2012-04-05 NOTE — Assessment & Plan Note (Signed)
Well controlled on Crestor, Metamucil and MegaRed, encouraged avoid trans fats, increase excercise

## 2012-04-05 NOTE — Assessment & Plan Note (Signed)
No recurrence. 

## 2012-04-05 NOTE — Assessment & Plan Note (Signed)
No further episodes, continues to follow with Dr Pearlean Brownie and participate in clinical trial

## 2012-04-05 NOTE — Patient Instructions (Addendum)
Preventive Care for Adults, Male A healthy lifestyle and preventative care can promote health and wellness. Preventative health guidelines for men include the following key practices:  A routine yearly physical is a good way to check with your caregiver about your health and preventative screening. It is a chance to share any concerns and updates on your health, and to receive a thorough exam.   Visit your dentist for a routine exam and preventative care every 6 months. Brush your teeth twice a day and floss once a day. Good oral hygiene prevents tooth decay and gum disease.   The frequency of eye exams is based on your age, health, family medical history, use of contact lenses, and other factors. Follow your caregiver's recommendations for frequency of eye exams.   Eat a healthy diet. Foods like vegetables, fruits, whole grains, low-fat dairy products, and lean protein foods contain the nutrients you need without too many calories. Decrease your intake of foods high in solid fats, added sugars, and salt. Eat the right amount of calories for you.Get information about a proper diet from your caregiver, if necessary.   Regular physical exercise is one of the most important things you can do for your health. Most adults should get at least 150 minutes of moderate-intensity exercise (any activity that increases your heart rate and causes you to sweat) each week. In addition, most adults need muscle-strengthening exercises on 2 or more days a week.   Maintain a healthy weight. The body mass index (BMI) is a screening tool to identify possible weight problems. It provides an estimate of body fat based on height and weight. Your caregiver can help determine your BMI, and can help you achieve or maintain a healthy weight.For adults 20 years and older:   A BMI below 18.5 is considered underweight.   A BMI of 18.5 to 24.9 is normal.   A BMI of 25 to 29.9 is considered overweight.   A BMI of 30 and above  is considered obese.   Maintain normal blood lipids and cholesterol levels by exercising and minimizing your intake of saturated fat. Eat a balanced diet with plenty of fruit and vegetables. Blood tests for lipids and cholesterol should begin at age 20 and be repeated every 5 years. If your lipid or cholesterol levels are high, you are over 50, or you are a high risk for heart disease, you may need your cholesterol levels checked more frequently.Ongoing high lipid and cholesterol levels should be treated with medicines if diet and exercise are not effective.   If you smoke, find out from your caregiver how to quit. If you do not use tobacco, do not start.   If you choose to drink alcohol, do not exceed 2 drinks per day. One drink is considered to be 12 ounces (355 mL) of beer, 5 ounces (148 mL) of wine, or 1.5 ounces (44 mL) of liquor.   Avoid use of street drugs. Do not share needles with anyone. Ask for help if you need support or instructions about stopping the use of drugs.   High blood pressure causes heart disease and increases the risk of stroke. Your blood pressure should be checked at least every 1 to 2 years. Ongoing high blood pressure should be treated with medicines, if weight loss and exercise are not effective.   If you are 45 to 75 years old, ask your caregiver if you should take aspirin to prevent heart disease.   Diabetes screening involves taking a blood   sample to check your fasting blood sugar level. This should be done once every 3 years, after age 45, if you are within normal weight and without risk factors for diabetes. Testing should be considered at a younger age or be carried out more frequently if you are overweight and have at least 1 risk factor for diabetes.   Colorectal cancer can be detected and often prevented. Most routine colorectal cancer screening begins at the age of 50 and continues through age 75. However, your caregiver may recommend screening at an earlier  age if you have risk factors for colon cancer. On a yearly basis, your caregiver may provide home test kits to check for hidden blood in the stool. Use of a small camera at the end of a tube, to directly examine the colon (sigmoidoscopy or colonoscopy), can detect the earliest forms of colorectal cancer. Talk to your caregiver about this at age 50, when routine screening begins. Direct examination of the colon should be repeated every 5 to 10 years through age 75, unless early forms of pre-cancerous polyps or small growths are found.   Hepatitis C blood testing is recommended for all people born from 1945 through 1965 and any individual with known risks for hepatitis C.   Practice safe sex. Use condoms and avoid high-risk sexual practices to reduce the spread of sexually transmitted infections (STIs). STIs include gonorrhea, chlamydia, syphilis, trichomonas, herpes, HPV, and human immunodeficiency virus (HIV). Herpes, HIV, and HPV are viral illnesses that have no cure. They can result in disability, cancer, and death.   A one-time screening for abdominal aortic aneurysm (AAA) and surgical repair of large AAAs by sound wave imaging (ultrasonography) is recommended for ages 65 to 75 years who are current or former smokers.   Healthy men should no longer receive prostate-specific antigen (PSA) blood tests as part of routine cancer screening. Consult with your caregiver about prostate cancer screening.   Testicular cancer screening is not recommended for adult males who have no symptoms. Screening includes self-exam, caregiver exam, and other screening tests. Consult with your caregiver about any symptoms you have or any concerns you have about testicular cancer.   Use sunscreen with skin protection factor (SPF) of 30 or more. Apply sunscreen liberally and repeatedly throughout the day. You should seek shade when your shadow is shorter than you. Protect yourself by wearing long sleeves, pants, a  wide-brimmed hat, and sunglasses year round, whenever you are outdoors.   Once a month, do a whole body skin exam, using a mirror to look at the skin on your back. Notify your caregiver of new moles, moles that have irregular borders, moles that are larger than a pencil eraser, or moles that have changed in shape or color.   Stay current with required immunizations.   Influenza. You need a dose every fall (or winter). The composition of the flu vaccine changes each year, so being vaccinated once is not enough.   Pneumococcal polysaccharide. You need 1 to 2 doses if you smoke cigarettes or if you have certain chronic medical conditions. You need 1 dose at age 65 (or older) if you have never been vaccinated.   Tetanus, diphtheria, pertussis (Tdap, Td). Get 1 dose of Tdap vaccine if you are younger than age 65 years, are over 65 and have contact with an infant, are a healthcare worker, or simply want to be protected from whooping cough. After that, you need a Td booster dose every 10 years. Consult your caregiver if   you have not had at least 3 tetanus and diphtheria-containing shots sometime in your life or have a deep or dirty wound.   HPV. This vaccine is recommended for males 13 through 75 years of age. This vaccine may be given to men 22 through 75 years of age who have not completed the 3 dose series. It is recommended for men through age 26 who have sex with men or whose immune system is weakened because of HIV infection, other illness, or medications. The vaccine is given in 3 doses over 6 months.   Measles, mumps, rubella (MMR). You need at least 1 dose of MMR if you were born in 1957 or later. You may also need a 2nd dose.   Meningococcal. If you are age 19 to 21 years and a first-year college student living in a residence hall, or have one of several medical conditions, you need to get vaccinated against meningococcal disease. You may also need additional booster doses.   Zoster (shingles).  If you are age 60 years or older, you should get this vaccine.   Varicella (chickenpox). If you have never had chickenpox or you were vaccinated but received only 1 dose, talk to your caregiver to find out if you need this vaccine.   Hepatitis A. You need this vaccine if you have a specific risk factor for hepatitis A virus infection, or you simply wish to be protected from this disease. The vaccine is usually given as 2 doses, 6 to 18 months apart.   Hepatitis B. You need this vaccine if you have a specific risk factor for hepatitis B virus infection or you simply wish to be protected from this disease. The vaccine is given in 3 doses, usually over 6 months.  Preventative Service / Frequency Ages 19 to 39  Blood pressure check.** / Every 1 to 2 years.   Lipid and cholesterol check.** / Every 5 years beginning at age 20.   Hepatitis C blood test.** / For any individual with known risks for hepatitis C.   Skin self-exam. / Monthly.   Influenza immunization.** / Every year.   Pneumococcal polysaccharide immunization.** / 1 to 2 doses if you smoke cigarettes or if you have certain chronic medical conditions.   Tetanus, diphtheria, pertussis (Tdap,Td) immunization. / A one-time dose of Tdap vaccine. After that, you need a Td booster dose every 10 years.   HPV immunization. / 3 doses over 6 months, if 26 and younger.   Measles, mumps, rubella (MMR) immunization. / You need at least 1 dose of MMR if you were born in 1957 or later. You may also need a 2nd dose.   Meningococcal immunization. / 1 dose if you are age 19 to 21 years and a first-year college student living in a residence hall, or have one of several medical conditions, you need to get vaccinated against meningococcal disease. You may also need additional booster doses.   Varicella immunization.** / Consult your caregiver.   Hepatitis A immunization.** / Consult your caregiver. 2 doses, 6 to 18 months apart.   Hepatitis B  immunization.** / Consult your caregiver. 3 doses usually over 6 months.  Ages 40 to 64  Blood pressure check.** / Every 1 to 2 years.   Lipid and cholesterol check.** / Every 5 years beginning at age 20.   Fecal occult blood test (FOBT) of stool. / Every year beginning at age 50 and continuing until age 75. You may not have to do this test if   you get colonoscopy every 10 years.   Flexible sigmoidoscopy** or colonoscopy.** / Every 5 years for a flexible sigmoidoscopy or every 10 years for a colonoscopy beginning at age 50 and continuing until age 75.   Hepatitis C blood test.** / For all people born from 1945 through 1965 and any individual with known risks for hepatitis C.   Skin self-exam. / Monthly.   Influenza immunization.** / Every year.   Pneumococcal polysaccharide immunization.** / 1 to 2 doses if you smoke cigarettes or if you have certain chronic medical conditions.   Tetanus, diphtheria, pertussis (Tdap/Td) immunization.** / A one-time dose of Tdap vaccine. After that, you need a Td booster dose every 10 years.   Measles, mumps, rubella (MMR) immunization. / You need at least 1 dose of MMR if you were born in 1957 or later. You may also need a 2nd dose.   Varicella immunization.**/ Consult your caregiver.   Meningococcal immunization.** / Consult your caregiver.   Hepatitis A immunization.** / Consult your caregiver. 2 doses, 6 to 18 months apart.   Hepatitis B immunization.** / Consult your caregiver. 3 doses, usually over 6 months.  Ages 65 and over  Blood pressure check.** / Every 1 to 2 years.   Lipid and cholesterol check.**/ Every 5 years beginning at age 20.   Fecal occult blood test (FOBT) of stool. / Every year beginning at age 50 and continuing until age 75. You may not have to do this test if you get colonoscopy every 10 years.   Flexible sigmoidoscopy** or colonoscopy.** / Every 5 years for a flexible sigmoidoscopy or every 10 years for a colonoscopy  beginning at age 50 and continuing until age 75.   Hepatitis C blood test.** / For all people born from 1945 through 1965 and any individual with known risks for hepatitis C.   Abdominal aortic aneurysm (AAA) screening.** / A one-time screening for ages 65 to 75 years who are current or former smokers.   Skin self-exam. / Monthly.   Influenza immunization.** / Every year.   Pneumococcal polysaccharide immunization.** / 1 dose at age 65 (or older) if you have never been vaccinated.   Tetanus, diphtheria, pertussis (Tdap, Td) immunization. / A one-time dose of Tdap vaccine if you are over 65 and have contact with an infant, are a healthcare worker, or simply want to be protected from whooping cough. After that, you need a Td booster dose every 10 years.   Varicella immunization. ** / Consult your caregiver.   Meningococcal immunization.** / Consult your caregiver.   Hepatitis A immunization. ** / Consult your caregiver. 2 doses, 6 to 18 months apart.   Hepatitis B immunization.** / Check with your caregiver. 3 doses, usually over 6 months.  **Family history and personal history of risk and conditions may change your caregiver's recommendations. Document Released: 09/01/2001 Document Revised: 06/25/2011 Document Reviewed: 12/01/2010 [employer] Patient Information 2012 [employer]. 

## 2012-04-12 ENCOUNTER — Other Ambulatory Visit: Payer: Medicare Other

## 2012-06-20 ENCOUNTER — Telehealth: Payer: Self-pay

## 2012-06-20 NOTE — Telephone Encounter (Signed)
Patient left a message stating his Opthalmologist gave patient Doxycycline 100 mg and pt just wants to confirm with MD this will be okay to take with his other medications? Please advise?

## 2012-06-20 NOTE — Telephone Encounter (Signed)
Yes it is OK to take the Doxycycline with his other meds

## 2012-06-20 NOTE — Telephone Encounter (Signed)
Pt informed

## 2012-08-02 ENCOUNTER — Other Ambulatory Visit (INDEPENDENT_AMBULATORY_CARE_PROVIDER_SITE_OTHER): Payer: Medicare Other

## 2012-08-02 DIAGNOSIS — Z125 Encounter for screening for malignant neoplasm of prostate: Secondary | ICD-10-CM

## 2012-08-02 DIAGNOSIS — D649 Anemia, unspecified: Secondary | ICD-10-CM

## 2012-08-02 LAB — RETICULOCYTES
ABS Retic: 46.9 10*3/uL (ref 19.0–186.0)
RBC.: 3.91 MIL/uL — ABNORMAL LOW (ref 4.22–5.81)

## 2012-08-02 LAB — CBC
Hemoglobin: 12 g/dL — ABNORMAL LOW (ref 13.0–17.0)
Platelets: 148 10*3/uL — ABNORMAL LOW (ref 150.0–400.0)
WBC: 4.7 10*3/uL (ref 4.5–10.5)

## 2012-08-02 NOTE — Progress Notes (Signed)
Labs only

## 2012-08-03 LAB — IBC PANEL
Saturation Ratios: 15.9 % — ABNORMAL LOW (ref 20.0–50.0)
Transferrin: 282.9 mg/dL (ref 212.0–360.0)

## 2012-08-03 LAB — PSA: PSA: 0.25 ng/mL (ref 0.10–4.00)

## 2012-08-09 ENCOUNTER — Ambulatory Visit (INDEPENDENT_AMBULATORY_CARE_PROVIDER_SITE_OTHER): Payer: Medicare Other | Admitting: Family Medicine

## 2012-08-09 ENCOUNTER — Encounter: Payer: Self-pay | Admitting: Family Medicine

## 2012-08-09 ENCOUNTER — Ambulatory Visit: Payer: Medicare Other | Admitting: Family Medicine

## 2012-08-09 VITALS — BP 108/68 | HR 61 | Temp 98.0°F | Ht 74.0 in | Wt 226.0 lb

## 2012-08-09 DIAGNOSIS — I10 Essential (primary) hypertension: Secondary | ICD-10-CM

## 2012-08-09 DIAGNOSIS — D696 Thrombocytopenia, unspecified: Secondary | ICD-10-CM

## 2012-08-09 DIAGNOSIS — I6350 Cerebral infarction due to unspecified occlusion or stenosis of unspecified cerebral artery: Secondary | ICD-10-CM

## 2012-08-09 DIAGNOSIS — E875 Hyperkalemia: Secondary | ICD-10-CM

## 2012-08-09 DIAGNOSIS — E039 Hypothyroidism, unspecified: Secondary | ICD-10-CM

## 2012-08-09 NOTE — Assessment & Plan Note (Signed)
Mild, improved, is interested in donating platelets, is given

## 2012-08-09 NOTE — Patient Instructions (Addendum)
For the dry skin, increase hydration, try Sarna, Eucerin or Aveeno lotions and/or Olive Oil

## 2012-08-14 NOTE — Progress Notes (Signed)
Patient ID: Tanner [name], male   DOB: 11-Jun-1935, 78 y.o.   MRN: [id number] EMMANUAL GAUTHREAUX [id number] [birth year] 08/14/2012      Progress Note-Follow Up  Subjective  Chief Complaint  Chief Complaint  Patient presents with  . Follow-up    4 month    HPI  Patient is a 78 year old Caucasian male who is in today for annual exam. He is doing well. His largest complaint today is actually dry skin. He has tried some over-the-counter lotions and notes it is always worse in the winter. Continues to take part in an academic study regarding cerebrovascular disease but has had no further episodes. No acute illness or acute complaints today. No chest pain, palpitations, shortness of breath, GI or GU complaints are noted today. Follows with Dr. Burgess Estelle of ophthalmology.  Past Medical History  Diagnosis Date  . Allergy     seasonal- mostly spring  . TOBACCO ABUSE, HX OF 03/17/2010  . Overweight 07/01/2010  . Personal history of other infectious and parasitic disease 03/17/2010  . Mixed hyperlipidemia 03/17/2010  . HYPERKALEMIA 05/27/2010  . FATIGUE 03/17/2010  . CHICKENPOX, HX OF 03/17/2010  . CEREBROVASCULAR ACCIDENT 03/17/2010  . CAROTID ARTERY OCCLUSION, WITH INFARCTION 03/17/2010  . ASTHMA, CHILDHOOD 03/17/2010  . Hearing loss 11/04/2010    bilateral  . Anemia 02/03/2011  . Hypertension   . Tubular adenoma of colon 06/10/2011  . Hypothyroid 12/08/2011  . BCC (basal cell carcinoma) 12/08/2011  . Thrombocytopenia 04/05/2012    Past Surgical History  Procedure Date  . Tonsillectomy   . Pilonidal cyst excision 1968    removed  . Knee cartliage repair 1964, 1990    bilateral  . Rotator cuff repair     right    Family History  Problem Relation Age of Onset  . Alzheimer's disease Mother   . Emphysema Sister     cigarettes  . Heart disease Son   . Coronary artery disease Brother   . Heart attack Brother   . Other Brother     heart problems- from fathers side  . Cancer Sister    gyn  . Bipolar disorder Daughter   . Obesity Daughter     History   Social History  . Marital Status: Divorced    Spouse Name: N/A    Number of Children: 3  . Years of Education: N/A   Occupational History  . retired    Social History Main Topics  . Smoking status: Former Smoker -- 0.5 packs/day    Types: Cigarettes    Quit date: 07/20/1976  . Smokeless tobacco: Never Used  . Alcohol Use: 4.2 oz/week    7 Glasses of wine per week  . Drug Use: No  . Sexually Active: No   Other Topics Concern  . Not on file   Social History Narrative  . No narrative on file    Current Outpatient Prescriptions on File Prior to Visit  Medication Sig Dispense Refill  . chlorthalidone (HYGROTON) 25 MG tablet Take 1 tablet (25 mg total) by mouth daily.  90 tablet  3  . clopidogrel (PLAVIX) 75 MG tablet Take 1 tablet (75 mg total) by mouth daily. With a meal  Generic please  90 tablet  3  . levothyroxine (LEVOTHROID) 25 MCG tablet Take 1 tablet (25 mcg total) by mouth daily.  90 tablet  3  . ramipril (ALTACE) 10 MG capsule Take 1 capsule (10 mg total) by mouth 2 (two) times daily.  180 capsule  3  . rosuvastatin (CRESTOR) 40 MG tablet Take 1 tablet (40 mg total) by mouth daily.  90 tablet  3  . STUDY MEDICATION Pioglitazone- IRIS test- through Dr Pearlean Brownie      . fluocinonide (LIDEX) 0.05 % gel Apply topically daily as needed.         No Known Allergies  Review of Systems  Review of Systems  Constitutional: Negative for fever and malaise/fatigue.  HENT: Negative for congestion.   Eyes: Negative for discharge.  Respiratory: Negative for shortness of breath.   Cardiovascular: Negative for chest pain, palpitations and leg swelling.  Gastrointestinal: Negative for nausea, abdominal pain and diarrhea.  Genitourinary: Negative for dysuria.  Musculoskeletal: Negative for falls.  Skin: Negative for rash.  Neurological: Negative for loss of consciousness and headaches.  Endo/Heme/Allergies:  Negative for polydipsia.  Psychiatric/Behavioral: Negative for depression and suicidal ideas. The patient is not nervous/anxious and does not have insomnia.     Objective  BP 108/68  Pulse 61  Temp 98 F (36.7 C) (Oral)  Ht 6\' 2"  (1.88 m)  Wt 226 lb (102.513 kg)  BMI 29.02 kg/m2  SpO2 98%  Physical Exam  Physical Exam  Constitutional: He is oriented to person, place, and time and well-developed, well-nourished, and in no distress. No distress.  HENT:  Head: Normocephalic and atraumatic.  Eyes: Conjunctivae normal are normal.  Neck: Neck supple. No thyromegaly present.  Cardiovascular: Normal rate, regular rhythm and normal heart sounds.   Pulmonary/Chest: Effort normal and breath sounds normal. No respiratory distress.  Abdominal: He exhibits no distension and no mass. There is no tenderness.  Musculoskeletal: He exhibits no edema.  Neurological: He is alert and oriented to person, place, and time.  Skin: Skin is warm.  Psychiatric: Memory, affect and judgment normal.    Lab Results  Component Value Date   TSH 4.19 03/29/2012   Lab Results  Component Value Date   WBC 4.7 08/02/2012   HGB 12.0* 08/02/2012   HCT 36.4* 08/02/2012   MCV 96.9 08/02/2012   PLT 148.0* 08/02/2012   Lab Results  Component Value Date   CREATININE 1.1 03/29/2012   BUN 21 03/29/2012   NA 138 03/29/2012   K 4.1 03/29/2012   CL 104 03/29/2012   CO2 27 03/29/2012   Lab Results  Component Value Date   ALT 16 03/29/2012   AST 30 03/29/2012   ALKPHOS 39 03/29/2012   BILITOT 0.5 03/29/2012   Lab Results  Component Value Date   CHOL 136 03/29/2012   Lab Results  Component Value Date   HDL 48.70 03/29/2012   Lab Results  Component Value Date   LDLCALC 68 03/29/2012   Lab Results  Component Value Date   TRIG 99.0 03/29/2012   Lab Results  Component Value Date   CHOLHDL 3 03/29/2012     Assessment & Plan  Thrombocytopenia Mild, improved, is interested in donating platelets, is given    ESSENTIAL HYPERTENSION, BENIGN Well controlled on current meds no changes.  Hypothyroid TSH normalized on low dose Levothyroxine, recheck with next draw.  HYPERKALEMIA Resolved no changes.  CEREBROVASCULAR ACCIDENT No recurrent events, continues his care in his study.

## 2012-08-14 NOTE — Assessment & Plan Note (Signed)
Well controlled on current meds no changes 

## 2012-08-14 NOTE — Assessment & Plan Note (Signed)
No recurrent events, continues his care in his study.

## 2012-08-14 NOTE — Assessment & Plan Note (Signed)
TSH normalized on low dose Levothyroxine, recheck with next draw.

## 2012-08-14 NOTE — Assessment & Plan Note (Signed)
Resolved no changes.

## 2012-12-01 ENCOUNTER — Ambulatory Visit (INDEPENDENT_AMBULATORY_CARE_PROVIDER_SITE_OTHER): Payer: Medicare Other | Admitting: Family Medicine

## 2012-12-01 ENCOUNTER — Encounter: Payer: Self-pay | Admitting: Family Medicine

## 2012-12-01 VITALS — BP 124/68 | HR 63 | Temp 98.3°F | Ht 74.0 in | Wt 226.0 lb

## 2012-12-01 DIAGNOSIS — E782 Mixed hyperlipidemia: Secondary | ICD-10-CM

## 2012-12-01 DIAGNOSIS — E785 Hyperlipidemia, unspecified: Secondary | ICD-10-CM

## 2012-12-01 DIAGNOSIS — I779 Disorder of arteries and arterioles, unspecified: Secondary | ICD-10-CM

## 2012-12-01 DIAGNOSIS — R5381 Other malaise: Secondary | ICD-10-CM

## 2012-12-01 DIAGNOSIS — R5383 Other fatigue: Secondary | ICD-10-CM

## 2012-12-01 DIAGNOSIS — I10 Essential (primary) hypertension: Secondary | ICD-10-CM

## 2012-12-01 DIAGNOSIS — I6350 Cerebral infarction due to unspecified occlusion or stenosis of unspecified cerebral artery: Secondary | ICD-10-CM

## 2012-12-01 DIAGNOSIS — E039 Hypothyroidism, unspecified: Secondary | ICD-10-CM

## 2012-12-01 DIAGNOSIS — R351 Nocturia: Secondary | ICD-10-CM

## 2012-12-01 LAB — LIPID PANEL
Cholesterol: 135 mg/dL (ref 0–200)
HDL: 49 mg/dL (ref >39)
Total CHOL/HDL Ratio: 2.8 Ratio

## 2012-12-01 LAB — CBC
HCT: 38.2 % — ABNORMAL LOW (ref 39.0–52.0)
MCH: 31 pg (ref 26.0–34.0)
MCHC: 33.5 g/dL (ref 30.0–36.0)
MCV: 92.5 fL (ref 78.0–100.0)
Platelets: 155 10*3/uL (ref 150–400)
RDW: 15.4 % (ref 11.5–15.5)

## 2012-12-01 LAB — RENAL FUNCTION PANEL
Albumin: 4.6 g/dL (ref 3.5–5.2)
BUN: 22 mg/dL (ref 6–23)
Creat: 1.2 mg/dL (ref 0.50–1.35)
Phosphorus: 3.3 mg/dL (ref 2.3–4.6)

## 2012-12-01 LAB — HEPATIC FUNCTION PANEL
Albumin: 4.6 g/dL (ref 3.5–5.2)
Total Bilirubin: 0.7 mg/dL (ref 0.3–1.2)
Total Protein: 7 g/dL (ref 6.0–8.3)

## 2012-12-01 NOTE — Progress Notes (Signed)
Patient ID: Tanner [name], male   DOB: 1935-05-09, 78 y.o.   MRN: [id number] Tanner [name] [id number] Oct 01, 1934 12/01/2012      Progress Note-Follow Up  Subjective  Chief Complaint  Chief Complaint  Patient presents with  . no strength    and stomach upset    HPI  Patient is a 78 year old Caucasian male in today for evaluation of worsening fatigue. Is complaining of feeling exhausted and weak for about 3 weeks now. Is still able to exercise on a treadmill but finds himself having difficulty with the energy to complete tasks of daily living. Sleeping more. Has been complaining of some mild GI upset chew with some nausea over the last few weeks. No vomiting or anorexia. No abdominal pain or urinary complaints except for nocturia. Does have a mild occasional headache but no significant headache or congestion. No chest pain or marked shortness of breath.  Past Medical History  Diagnosis Date  . Allergy     seasonal- mostly spring  . TOBACCO ABUSE, HX OF 03/17/2010  . Overweight 07/01/2010  . Personal history of other infectious and parasitic disease 03/17/2010  . Mixed hyperlipidemia 03/17/2010  . HYPERKALEMIA 05/27/2010  . FATIGUE 03/17/2010  . CHICKENPOX, HX OF 03/17/2010  . CEREBROVASCULAR ACCIDENT 03/17/2010  . CAROTID ARTERY OCCLUSION, WITH INFARCTION 03/17/2010  . ASTHMA, CHILDHOOD 03/17/2010  . Hearing loss 11/04/2010    bilateral  . Anemia 02/03/2011  . Hypertension   . Tubular adenoma of colon 06/10/2011  . Hypothyroid 12/08/2011  . BCC (basal cell carcinoma) 12/08/2011  . Thrombocytopenia 04/05/2012    Past Surgical History  Procedure Laterality Date  . Tonsillectomy    . Pilonidal cyst excision  1968    removed  . Knee cartliage repair  1964, 1990    bilateral  . Rotator cuff repair      right    Family History  Problem Relation Age of Onset  . Alzheimer's disease Mother   . Emphysema Sister     cigarettes  . Heart disease Son   . Coronary artery disease  Brother   . Heart attack Brother   . Other Brother     heart problems- from fathers side  . Cancer Sister     gyn  . Bipolar disorder Daughter   . Obesity Daughter     History   Social History  . Marital Status: Divorced    Spouse Name: N/A    Number of Children: 3  . Years of Education: N/A   Occupational History  . retired    Social History Main Topics  . Smoking status: Former Smoker -- 0.50 packs/day    Types: Cigarettes    Quit date: 07/20/1976  . Smokeless tobacco: Never Used  . Alcohol Use: 4.2 oz/week    7 Glasses of wine per week  . Drug Use: No  . Sexually Active: No   Other Topics Concern  . Not on file   Social History Narrative  . No narrative on file    Current Outpatient Prescriptions on File Prior to Visit  Medication Sig Dispense Refill  . chlorthalidone (HYGROTON) 25 MG tablet Take 1 tablet (25 mg total) by mouth daily.  90 tablet  3  . clopidogrel (PLAVIX) 75 MG tablet Take 1 tablet (75 mg total) by mouth daily. With a meal  Generic please  90 tablet  3  . levothyroxine (LEVOTHROID) 25 MCG tablet Take 1 tablet (25 mcg total) by mouth daily.  90 tablet  3  . ramipril (ALTACE) 10 MG capsule Take 1 capsule (10 mg total) by mouth 2 (two) times daily.  180 capsule  3  . rosuvastatin (CRESTOR) 40 MG tablet Take 1 tablet (40 mg total) by mouth daily.  90 tablet  3  . STUDY MEDICATION Pioglitazone- IRIS test- through Dr Pearlean Brownie       No current facility-administered medications on file prior to visit.    No Known Allergies  Review of Systems  Review of Systems  Constitutional: Positive for malaise/fatigue. Negative for fever.  HENT: Negative for congestion.   Eyes: Negative for discharge.  Respiratory: Negative for shortness of breath.   Cardiovascular: Negative for chest pain, palpitations and leg swelling.  Gastrointestinal: Positive for diarrhea. Negative for nausea and abdominal pain.  Genitourinary: Negative for dysuria.  Musculoskeletal:  Negative for falls.  Skin: Negative for rash.  Neurological: Negative for loss of consciousness and headaches.  Endo/Heme/Allergies: Negative for polydipsia.  Psychiatric/Behavioral: Negative for depression and suicidal ideas. The patient is not nervous/anxious and does not have insomnia.     Objective  BP 124/68  Pulse 63  Temp(Src) 98.3 F (36.8 C) (Oral)  Ht 6\' 2"  (1.88 m)  Wt 226 lb 0.6 oz (102.531 kg)  BMI 29.01 kg/m2  SpO2 96%  Physical Exam  Physical Exam  Constitutional: He is oriented to person, place, and time and well-developed, well-nourished, and in no distress. No distress.  HENT:  Head: Normocephalic and atraumatic.  Eyes: Conjunctivae are normal.  Neck: Neck supple. No thyromegaly present.  Cardiovascular: Normal rate, regular rhythm and normal heart sounds.   No murmur heard. Pulmonary/Chest: Effort normal and breath sounds normal. No respiratory distress.  Abdominal: He exhibits no distension and no mass. There is no tenderness.  Musculoskeletal: He exhibits no edema.  Neurological: He is alert and oriented to person, place, and time.  Skin: Skin is warm.  Psychiatric: Memory, affect and judgment normal.    Lab Results  Component Value Date   TSH 4.19 03/29/2012   Lab Results  Component Value Date   WBC 4.7 08/02/2012   HGB 12.0* 08/02/2012   HCT 36.4* 08/02/2012   MCV 96.9 08/02/2012   PLT 148.0* 08/02/2012   Lab Results  Component Value Date   CREATININE 1.1 03/29/2012   BUN 21 03/29/2012   NA 138 03/29/2012   K 4.1 03/29/2012   CL 104 03/29/2012   CO2 27 03/29/2012   Lab Results  Component Value Date   ALT 16 03/29/2012   AST 30 03/29/2012   ALKPHOS 39 03/29/2012   BILITOT 0.5 03/29/2012   Lab Results  Component Value Date   CHOL 136 03/29/2012   Lab Results  Component Value Date   HDL 48.70 03/29/2012   Lab Results  Component Value Date   LDLCALC 68 03/29/2012   Lab Results  Component Value Date   TRIG 99.0 03/29/2012   Lab Results   Component Value Date   CHOLHDL 3 03/29/2012     Assessment & Plan  ESSENTIAL HYPERTENSION, BENIGN Well control no changes to meds  CEREBROVASCULAR ACCIDENT No recurrence, has followed with [facility]  MIXED HYPERLIPIDEMIA Tolerating Crestor  Hypothyroid Poorly controlled, may be contributing to fatigue will increase Levothyroxine to 50 mcg daily.  Other malaise and fatigue Likely multifactorial is experiencing fatigue and with numerous risk factors for cardiac disease will arrange for cardiology consultation and pulmonology consultation. EKG is c/w pulmonary difficulty and we discussed the possibility of sleep apnea. Does have  a h/o asthma as well.

## 2012-12-01 NOTE — Patient Instructions (Addendum)
Start a probiotic such as [employer] by [employer]  Sleep Apnea  Sleep apnea is a sleep disorder characterized by abnormal pauses in breathing while you sleep. When your breathing pauses, the level of oxygen in your blood decreases. This causes you to move out of deep sleep and into light sleep. As a result, your quality of sleep is poor, and the system that carries your blood throughout your body (cardiovascular system) experiences stress. If sleep apnea remains untreated, the following conditions can develop:  High blood pressure (hypertension).  Coronary artery disease.  Inability to achieve or maintain an erection (impotence).  Impairment of your thought process (cognitive dysfunction). There are three types of sleep apnea: 1. Obstructive sleep apnea Pauses in breathing during sleep because of a blocked airway. 2. Central sleep apnea Pauses in breathing during sleep because the area of the brain that controls your breathing does not send the correct signals to the muscles that control breathing. 3. Mixed sleep apnea A combination of both obstructive and central sleep apnea. RISK FACTORS The following risk factors can increase your risk of developing sleep apnea:  Being overweight.  Smoking.  Having narrow passages in your nose and throat.  Being of older age.  Being male.  Alcohol use.  Sedative and tranquilizer use.  Ethnicity. Among individuals younger than 35 years, African Americans are at increased risk of sleep apnea. SYMPTOMS   Difficulty staying asleep.  Daytime sleepiness and fatigue.  Loss of energy.  Irritability.  Loud, heavy snoring.  Morning headaches.  Trouble concentrating.  Forgetfulness.  Decreased interest in sex. DIAGNOSIS  In order to diagnose sleep apnea, your caregiver will perform a physical examination. Your caregiver may suggest that you take a home sleep test. Your caregiver may also recommend that you spend the night in a  sleep lab. In the sleep lab, several monitors record information about your heart, lungs, and brain while you sleep. Your leg and arm movements and blood oxygen level are also recorded. TREATMENT The following actions may help to resolve mild sleep apnea:  Sleeping on your side.   Using a decongestant if you have nasal congestion.   Avoiding the use of depressants, including alcohol, sedatives, and narcotics.   Losing weight and modifying your diet if you are overweight. There also are devices and treatments to help open your airway:  Oral appliances. These are custom-made mouthpieces that shift your lower jaw forward and slightly open your bite. This opens your airway.  Devices that create positive airway pressure. This positive pressure "splints" your airway open to help you breathe better during sleep. The following devices create positive airway pressure:  Continuous positive airway pressure (CPAP) device. The CPAP device creates a continuous level of air pressure with an air pump. The air is delivered to your airway through a mask while you sleep. This continuous pressure keeps your airway open.  Nasal expiratory positive airway pressure (EPAP) device. The EPAP device creates positive air pressure as you exhale. The device consists of single-use valves, which are inserted into each nostril and held in place by adhesive. The valves create very little resistance when you inhale but create much more resistance when you exhale. That increased resistance creates the positive airway pressure. This positive pressure while you exhale keeps your airway open, making it easier to breath when you inhale again.  Bilevel positive airway pressure (BPAP) device. The BPAP device is used mainly in patients with central sleep apnea. This device is similar to the CPAP  device because it also uses an air pump to deliver continuous air pressure through a mask. However, with the BPAP machine, the pressure is  set at two different levels. The pressure when you exhale is lower than the pressure when you inhale.  Surgery. Typically, surgery is only done if you cannot comply with less invasive treatments or if the less invasive treatments do not improve your condition. Surgery involves removing excess tissue in your airway to create a wider passage way. Document Released: 06/26/2002 Document Revised: 01/05/2012 Document Reviewed: 11/12/2011 [facility] Patient Information 2013 [place], [place].

## 2012-12-02 ENCOUNTER — Other Ambulatory Visit: Payer: Self-pay | Admitting: Family Medicine

## 2012-12-02 DIAGNOSIS — J45909 Unspecified asthma, uncomplicated: Secondary | ICD-10-CM

## 2012-12-02 DIAGNOSIS — R5383 Other fatigue: Secondary | ICD-10-CM

## 2012-12-02 DIAGNOSIS — I10 Essential (primary) hypertension: Secondary | ICD-10-CM

## 2012-12-02 LAB — URINALYSIS
Bilirubin Urine: NEGATIVE
Glucose, UA: NEGATIVE mg/dL
Leukocytes, UA: NEGATIVE
Protein, ur: NEGATIVE mg/dL
pH: 7.5 (ref 5.0–8.0)

## 2012-12-02 MED ORDER — LEVOTHYROXINE SODIUM 50 MCG PO TABS: 50.0000 ug | ORAL_TABLET | Freq: Every day | ORAL | Status: DC

## 2012-12-02 NOTE — Progress Notes (Signed)
Quick Note:  Patient Informed and voiced understanding.  Pt would like to proceed with referral ______

## 2012-12-02 NOTE — Progress Notes (Signed)
Quick Note:  Patient Informed and voiced understanding.   RX sent to pharmacy and labs ordered ______

## 2012-12-02 NOTE — Progress Notes (Signed)
Quick Note:  Patient Informed and voiced understanding ______ 

## 2012-12-03 DIAGNOSIS — G4733 Obstructive sleep apnea (adult) (pediatric): Secondary | ICD-10-CM | POA: Insufficient documentation

## 2012-12-03 NOTE — Assessment & Plan Note (Signed)
No recurrence, has followed with [facility]

## 2012-12-03 NOTE — Assessment & Plan Note (Signed)
Poorly controlled, may be contributing to fatigue will increase Levothyroxine to 50 mcg daily.

## 2012-12-03 NOTE — Assessment & Plan Note (Signed)
Likely multifactorial is experiencing fatigue and with numerous risk factors for cardiac disease will arrange for cardiology consultation and pulmonology consultation. EKG is c/w pulmonary difficulty and we discussed the possibility of sleep apnea. Does have a h/o asthma as well.

## 2012-12-03 NOTE — Assessment & Plan Note (Signed)
Well control no changes to meds

## 2012-12-03 NOTE — Assessment & Plan Note (Signed)
Tolerating Crestor 

## 2012-12-15 ENCOUNTER — Encounter: Payer: Self-pay | Admitting: Critical Care Medicine

## 2012-12-15 ENCOUNTER — Ambulatory Visit (INDEPENDENT_AMBULATORY_CARE_PROVIDER_SITE_OTHER): Payer: Medicare Other | Admitting: Critical Care Medicine

## 2012-12-15 VITALS — BP 138/84 | HR 60 | Temp 98.0°F | Ht 74.0 in | Wt 229.0 lb

## 2012-12-15 DIAGNOSIS — R5383 Other fatigue: Secondary | ICD-10-CM

## 2012-12-15 DIAGNOSIS — Z87891 Personal history of nicotine dependence: Secondary | ICD-10-CM

## 2012-12-15 DIAGNOSIS — G4733 Obstructive sleep apnea (adult) (pediatric): Secondary | ICD-10-CM

## 2012-12-15 NOTE — Progress Notes (Signed)
Subjective:    Patient ID: Tanner [name], male    DOB: Jul 16, 1935, 78 y.o.   MRN: [id number]  HPI 78 y.o.WM  Here for eval of snoring and fatigue.  Hx asthma as child Pt saw PCP and notes more fatigue upon awakening SNores a lot. Never had sleep study.  No real dyspnea with exertion. Does use a treadmill, if takes off too fast an issue.  No real cough.  Former smoker, quit 1978 Notes some daytime hypersomnolence.   No nodding off in conversation, may nod off with TV. Not much alcohol,  occ scotch or wine with dinner Epworth scale 6      Review of Systems  Constitutional: Positive for fatigue. Negative for fever, chills, diaphoresis, activity change, appetite change and unexpected weight change.  HENT: Positive for hearing loss. Negative for ear pain, nosebleeds, congestion, sore throat, facial swelling, rhinorrhea, sneezing, mouth sores, trouble swallowing, neck pain, neck stiffness, dental problem, voice change, postnasal drip, sinus pressure, tinnitus and ear discharge.   Eyes: Positive for itching. Negative for photophobia, discharge and visual disturbance.  Respiratory: Negative for apnea, cough, choking, chest tightness, shortness of breath, wheezing and stridor.   Cardiovascular: Negative for chest pain, palpitations and leg swelling.  Gastrointestinal: Negative for nausea, vomiting, abdominal pain, constipation, blood in stool and abdominal distention.  Genitourinary: Positive for frequency. Negative for dysuria, urgency, hematuria, flank pain, decreased urine volume and difficulty urinating.  Musculoskeletal: Negative for myalgias, back pain, joint swelling, arthralgias and gait problem.  Skin: Negative for color change, pallor and rash.  Neurological: Positive for light-headedness and numbness. Negative for dizziness, tremors, seizures, syncope, speech difficulty, weakness and headaches.  Hematological: Negative for adenopathy. Does not bruise/bleed easily.   Psychiatric/Behavioral: Negative for confusion, sleep disturbance and agitation. The patient is not nervous/anxious.        Objective:   Physical Exam Filed Vitals:   12/15/12 1515  BP: 138/84  Pulse: 60  Temp: 98 F (36.7 C)  TempSrc: Oral  Height: 6\' 2"  (1.88 m)  Weight: 229 lb (103.874 kg)  SpO2: 100%    Gen: Pleasant, well-nourished, in no distress,  normal affect  ENT: No lesions,  mouth clear,  oropharynx clear, no postnasal drip  Neck: No JVD, no TMG, no carotid bruits  Lungs: No use of accessory muscles, no dullness to percussion, clear without rales or rhonchi  Cardiovascular: RRR, heart sounds normal, no murmur or gallops, no peripheral edema  Abdomen: soft and NT, no HSM,  BS normal  Musculoskeletal: No deformities, no cyanosis or clubbing  Neuro: alert, non focal  Skin: Warm, no lesions or rashes  No results found.        Assessment & Plan:   Other malaise and fatigue Ongoing daytime hypersomnolence and elevated Epworth score of greater than 6 therefore need to rule out obstructive sleep apnea Plan A home sleep study will be obtained   Updated Medication List Outpatient Encounter Prescriptions as of 12/15/2012  Medication Sig Dispense Refill  . b complex vitamins tablet Take 1 tablet by mouth daily.      . chlorthalidone (HYGROTON) 25 MG tablet Take 1 tablet (25 mg total) by mouth daily.  90 tablet  3  . Cholecalciferol (VITAMIN D PO) Take 1 tablet by mouth daily.      . clopidogrel (PLAVIX) 75 MG tablet Take 1 tablet (75 mg total) by mouth daily. With a meal  Generic please  90 tablet  3  . Coenzyme Q10 (CO Q-10) 300  MG CAPS Take 1 capsule by mouth daily.      [name] levothyroxine (SYNTHROID, LEVOTHROID) 50 MCG tablet Take 1 tablet (50 mcg total) by mouth daily before breakfast.  90 tablet  0  . OVER THE COUNTER MEDICATION Mega red 500 mg once daily      . ramipril (ALTACE) 10 MG capsule Take 1 capsule (10 mg total) by mouth 2 (two) times daily.   180 capsule  3  . rosuvastatin (CRESTOR) 40 MG tablet Take 1 tablet (40 mg total) by mouth daily.  90 tablet  3  . STUDY MEDICATION Pioglitazone- IRIS test- through Dr Pearlean Brownie       No facility-administered encounter medications on file as of 12/15/2012.

## 2012-12-15 NOTE — Patient Instructions (Addendum)
A home sleep study will be obtained No change in medications We will call results

## 2012-12-16 ENCOUNTER — Ambulatory Visit (INDEPENDENT_AMBULATORY_CARE_PROVIDER_SITE_OTHER): Payer: Medicare Other

## 2012-12-16 DIAGNOSIS — G4733 Obstructive sleep apnea (adult) (pediatric): Secondary | ICD-10-CM

## 2012-12-16 NOTE — Assessment & Plan Note (Signed)
Ongoing daytime hypersomnolence and elevated Epworth score of greater than 6 therefore need to rule out obstructive sleep apnea Plan A home sleep study will be obtained

## 2012-12-20 ENCOUNTER — Ambulatory Visit (INDEPENDENT_AMBULATORY_CARE_PROVIDER_SITE_OTHER): Payer: Medicare Other | Admitting: Family Medicine

## 2012-12-20 ENCOUNTER — Encounter: Payer: Self-pay | Admitting: Family Medicine

## 2012-12-20 ENCOUNTER — Other Ambulatory Visit: Payer: Self-pay | Admitting: Family Medicine

## 2012-12-20 VITALS — BP 108/62 | HR 57 | Temp 98.0°F | Ht 74.0 in | Wt 227.0 lb

## 2012-12-20 DIAGNOSIS — E039 Hypothyroidism, unspecified: Secondary | ICD-10-CM

## 2012-12-20 DIAGNOSIS — I10 Essential (primary) hypertension: Secondary | ICD-10-CM

## 2012-12-20 DIAGNOSIS — R5381 Other malaise: Secondary | ICD-10-CM

## 2012-12-20 DIAGNOSIS — I6350 Cerebral infarction due to unspecified occlusion or stenosis of unspecified cerebral artery: Secondary | ICD-10-CM

## 2012-12-20 DIAGNOSIS — R5383 Other fatigue: Secondary | ICD-10-CM

## 2012-12-20 DIAGNOSIS — E782 Mixed hyperlipidemia: Secondary | ICD-10-CM

## 2012-12-20 DIAGNOSIS — D531 Other megaloblastic anemias, not elsewhere classified: Secondary | ICD-10-CM

## 2012-12-20 DIAGNOSIS — D539 Nutritional anemia, unspecified: Secondary | ICD-10-CM

## 2012-12-20 LAB — CBC
HCT: 34 % — ABNORMAL LOW (ref 39.0–52.0)
MCH: 31.9 pg (ref 26.0–34.0)
MCV: 91.9 fL (ref 78.0–100.0)
RBC: 3.7 MIL/uL — ABNORMAL LOW (ref 4.22–5.81)
WBC: 4.8 10*3/uL (ref 4.0–10.5)

## 2012-12-20 NOTE — Assessment & Plan Note (Signed)
Has just had his Levothyroxine increased to 50 mcg daily, recheck tsh in 2-3 weeks

## 2012-12-20 NOTE — Assessment & Plan Note (Signed)
Well controlled, no changes to meds today 

## 2012-12-20 NOTE — Assessment & Plan Note (Signed)
No further episodes

## 2012-12-20 NOTE — Progress Notes (Signed)
Patient ID: ILIAN [name], male   DOB: 01/08/1935, 78 y.o.   MRN: [id number] Tanner [name] [id number] [birth year] 12/20/2012      Progress Note-Follow Up  Subjective  Chief Complaint  Chief Complaint  Patient presents with  . Follow-up    4 month    HPI  Patient is a 78 year old Caucasian male who is in today for followup. He does note that he is continuing to struggle with fatigue but believes it is slightly improved. Has no new complaints since his last visit. No chest pain or palpitations denies shortness or breath, recent illness, fevers, GI or GU complaints at this time. Taking medication as prescribed and is exercising. Past Medical History  Diagnosis Date  . Allergy     seasonal- mostly spring  . TOBACCO ABUSE, HX OF 03/17/2010  . Overweight(278.02) 07/01/2010  . Personal history of other infectious and parasitic disease 03/17/2010  . Mixed hyperlipidemia 03/17/2010  . HYPERKALEMIA 05/27/2010  . FATIGUE 03/17/2010  . CHICKENPOX, HX OF 03/17/2010  . CEREBROVASCULAR ACCIDENT 03/17/2010  . CAROTID ARTERY OCCLUSION, WITH INFARCTION 03/17/2010  . ASTHMA, CHILDHOOD 03/17/2010  . Hearing loss 11/04/2010    bilateral  . Anemia 02/03/2011  . Hypertension   . Tubular adenoma of colon 06/10/2011  . Hypothyroid 12/08/2011  . BCC (basal cell carcinoma) 12/08/2011  . Thrombocytopenia 04/05/2012    Past Surgical History  Procedure Laterality Date  . Tonsillectomy    . Pilonidal cyst excision  1968    removed  . Knee cartliage repair  1964, 1990    bilateral  . Rotator cuff repair      right    Family History  Problem Relation Age of Onset  . Alzheimer's disease Mother   . Emphysema Sister     cigarettes  . Heart disease Son   . Coronary artery disease Brother   . Heart attack Brother   . Other Brother     heart problems- from fathers side  . Cancer Sister     gyn  . Bipolar disorder Daughter   . Obesity Daughter   . Heart disease Sister     History   Social  History  . Marital Status: Divorced    Spouse Name: N/A    Number of Children: 3  . Years of Education: N/A   Occupational History  . retired    Social History Main Topics  . Smoking status: Former Smoker -- 2.00 packs/day for 20 years    Types: Cigarettes    Quit date: 07/19/1977  . Smokeless tobacco: Never Used  . Alcohol Use: 4.2 oz/week    7 Glasses of wine per week  . Drug Use: No  . Sexually Active: No   Other Topics Concern  . Not on file   Social History Narrative  . No narrative on file    Current Outpatient Prescriptions on File Prior to Visit  Medication Sig Dispense Refill  . b complex vitamins tablet Take 1 tablet by mouth daily.      . chlorthalidone (HYGROTON) 25 MG tablet Take 1 tablet (25 mg total) by mouth daily.  90 tablet  3  . Cholecalciferol (VITAMIN D PO) Take 1 tablet by mouth daily.      . clopidogrel (PLAVIX) 75 MG tablet Take 1 tablet (75 mg total) by mouth daily. With a meal  Generic please  90 tablet  3  . Coenzyme Q10 (CO Q-10) 300 MG CAPS Take 1 capsule by mouth daily.      [name]  levothyroxine (SYNTHROID, LEVOTHROID) 50 MCG tablet Take 1 tablet (50 mcg total) by mouth daily before breakfast.  90 tablet  0  . OVER THE COUNTER MEDICATION Mega red 500 mg once daily      . ramipril (ALTACE) 10 MG capsule Take 1 capsule (10 mg total) by mouth 2 (two) times daily.  180 capsule  3  . rosuvastatin (CRESTOR) 40 MG tablet Take 1 tablet (40 mg total) by mouth daily.  90 tablet  3  . STUDY MEDICATION Pioglitazone- IRIS test- through Dr Pearlean Brownie       No current facility-administered medications on file prior to visit.    No Known Allergies  Review of Systems  Review of Systems  Constitutional: Positive for malaise/fatigue. Negative for fever.  HENT: Negative for congestion.   Eyes: Negative for discharge.  Respiratory: Negative for shortness of breath.   Cardiovascular: Negative for chest pain, palpitations and leg swelling.  Gastrointestinal:  Negative for nausea, abdominal pain and diarrhea.  Genitourinary: Negative for dysuria.  Musculoskeletal: Negative for falls.  Skin: Negative for rash.  Neurological: Negative for loss of consciousness and headaches.  Endo/Heme/Allergies: Negative for polydipsia.  Psychiatric/Behavioral: Negative for depression and suicidal ideas. The patient is not nervous/anxious and does not have insomnia.     Objective  BP 108/62  Pulse 57  Temp(Src) 98 F (36.7 C) (Oral)  Ht 6\' 2"  (1.88 m)  Wt 227 lb (102.967 kg)  BMI 29.13 kg/m2  SpO2 99%  Physical Exam  Physical Exam  Constitutional: He is oriented to person, place, and time and well-developed, well-nourished, and in no distress. No distress.  HENT:  Head: Normocephalic and atraumatic.  Eyes: Conjunctivae are normal.  Neck: Neck supple. No thyromegaly present.  Cardiovascular: Normal rate, regular rhythm and normal heart sounds.   No murmur heard. Pulmonary/Chest: Effort normal and breath sounds normal. No respiratory distress.  Abdominal: He exhibits no distension and no mass. There is no tenderness.  Musculoskeletal: He exhibits no edema.  Neurological: He is alert and oriented to person, place, and time.  Skin: Skin is warm.  Psychiatric: Memory, affect and judgment normal.    Lab Results  Component Value Date   TSH 7.873* 12/01/2012   Lab Results  Component Value Date   WBC 5.1 12/01/2012   HGB 12.8* 12/01/2012   HCT 38.2* 12/01/2012   MCV 92.5 12/01/2012   PLT 155 12/01/2012   Lab Results  Component Value Date   CREATININE 1.20 12/01/2012   BUN 22 12/01/2012   NA 138 12/01/2012   K 4.8 12/01/2012   CL 101 12/01/2012   CO2 29 12/01/2012   Lab Results  Component Value Date   ALT 13 12/01/2012   AST 21 12/01/2012   ALKPHOS 54 12/01/2012   BILITOT 0.7 12/01/2012   Lab Results  Component Value Date   CHOL 135 12/01/2012   Lab Results  Component Value Date   HDL 49 12/01/2012   Lab Results  Component Value Date    LDLCALC 64 12/01/2012   Lab Results  Component Value Date   TRIG 111 12/01/2012   Lab Results  Component Value Date   CHOLHDL 2.8 12/01/2012     Assessment & Plan  Hypothyroid Has just had his Levothyroxine increased to 50 mcg daily, recheck tsh in 2-3 weeks  ESSENTIAL HYPERTENSION, BENIGN Well controlled, no changes to meds today  MIXED HYPERLIPIDEMIA Tolerating Crestor at present. Avoid trans fats.  CEREBROVASCULAR ACCIDENT No further episodes  Other malaise and fatigue  Has just had a sleep study this past Friday night. Did a home study. Struggled with sleeping that night, just turned in the device. The lights on the machine and having to sleep on his back disrupted his sleep. He noted the light was flashing during the night and was afraid it was not working but he has been reassured it was working.

## 2012-12-20 NOTE — Assessment & Plan Note (Signed)
Tolerating Crestor at present. Avoid trans fats.

## 2012-12-20 NOTE — Patient Instructions (Addendum)
Labs prior to visit lipid, renal, tsh, cbc, tsh prior to visit, needs labs at [facility], 3 mnths from now  Sleep Apnea  Sleep apnea is a sleep disorder characterized by abnormal pauses in breathing while you sleep. When your breathing pauses, the level of oxygen in your blood decreases. This causes you to move out of deep sleep and into light sleep. As a result, your quality of sleep is poor, and the system that carries your blood throughout your body (cardiovascular system) experiences stress. If sleep apnea remains untreated, the following conditions can develop:  High blood pressure (hypertension).  Coronary artery disease.  Inability to achieve or maintain an erection (impotence).  Impairment of your thought process (cognitive dysfunction). There are three types of sleep apnea: 1. Obstructive sleep apnea Pauses in breathing during sleep because of a blocked airway. 2. Central sleep apnea Pauses in breathing during sleep because the area of the brain that controls your breathing does not send the correct signals to the muscles that control breathing. 3. Mixed sleep apnea A combination of both obstructive and central sleep apnea. RISK FACTORS The following risk factors can increase your risk of developing sleep apnea:  Being overweight.  Smoking.  Having narrow passages in your nose and throat.  Being of older age.  Being male.  Alcohol use.  Sedative and tranquilizer use.  Ethnicity. Among individuals younger than 35 years, African Americans are at increased risk of sleep apnea. SYMPTOMS   Difficulty staying asleep.  Daytime sleepiness and fatigue.  Loss of energy.  Irritability.  Loud, heavy snoring.  Morning headaches.  Trouble concentrating.  Forgetfulness.  Decreased interest in sex. DIAGNOSIS  In order to diagnose sleep apnea, your caregiver will perform a physical examination. Your caregiver may suggest that you take a home sleep test. Your caregiver may  also recommend that you spend the night in a sleep lab. In the sleep lab, several monitors record information about your heart, lungs, and brain while you sleep. Your leg and arm movements and blood oxygen level are also recorded. TREATMENT The following actions may help to resolve mild sleep apnea:  Sleeping on your side.   Using a decongestant if you have nasal congestion.   Avoiding the use of depressants, including alcohol, sedatives, and narcotics.   Losing weight and modifying your diet if you are overweight. There also are devices and treatments to help open your airway:  Oral appliances. These are custom-made mouthpieces that shift your lower jaw forward and slightly open your bite. This opens your airway.  Devices that create positive airway pressure. This positive pressure "splints" your airway open to help you breathe better during sleep. The following devices create positive airway pressure:  Continuous positive airway pressure (CPAP) device. The CPAP device creates a continuous level of air pressure with an air pump. The air is delivered to your airway through a mask while you sleep. This continuous pressure keeps your airway open.  Nasal expiratory positive airway pressure (EPAP) device. The EPAP device creates positive air pressure as you exhale. The device consists of single-use valves, which are inserted into each nostril and held in place by adhesive. The valves create very little resistance when you inhale but create much more resistance when you exhale. That increased resistance creates the positive airway pressure. This positive pressure while you exhale keeps your airway open, making it easier to breath when you inhale again.  Bilevel positive airway pressure (BPAP) device. The BPAP device is used mainly in  patients with central sleep apnea. This device is similar to the CPAP device because it also uses an air pump to deliver continuous air pressure through a mask.  However, with the BPAP machine, the pressure is set at two different levels. The pressure when you exhale is lower than the pressure when you inhale.  Surgery. Typically, surgery is only done if you cannot comply with less invasive treatments or if the less invasive treatments do not improve your condition. Surgery involves removing excess tissue in your airway to create a wider passage way. Document Released: 06/26/2002 Document Revised: 01/05/2012 Document Reviewed: 11/12/2011 [facility] Patient Information 2014 [place], [place].

## 2012-12-20 NOTE — Assessment & Plan Note (Signed)
Has just had a sleep study this past Friday night. Did a home study. Struggled with sleeping that night, just turned in the device. The lights on the machine and having to sleep on his back disrupted his sleep. He noted the light was flashing during the night and was afraid it was not working but he has been reassured it was working.

## 2012-12-21 LAB — RETICULOCYTES
ABS Retic: 57.2 10*3/uL (ref 19.0–186.0)
Retic Ct Pct: 1.5 % (ref 0.4–2.3)

## 2012-12-22 ENCOUNTER — Telehealth: Payer: Self-pay | Admitting: Critical Care Medicine

## 2012-12-22 ENCOUNTER — Encounter: Payer: Self-pay | Admitting: Neurology

## 2012-12-22 ENCOUNTER — Ambulatory Visit (INDEPENDENT_AMBULATORY_CARE_PROVIDER_SITE_OTHER): Payer: Medicare Other | Admitting: Neurology

## 2012-12-22 VITALS — BP 151/65 | HR 60 | Temp 97.2°F | Ht 74.0 in | Wt 229.0 lb

## 2012-12-22 DIAGNOSIS — G4733 Obstructive sleep apnea (adult) (pediatric): Secondary | ICD-10-CM

## 2012-12-22 DIAGNOSIS — I10 Essential (primary) hypertension: Secondary | ICD-10-CM

## 2012-12-22 DIAGNOSIS — E785 Hyperlipidemia, unspecified: Secondary | ICD-10-CM

## 2012-12-22 DIAGNOSIS — I63239 Cerebral infarction due to unspecified occlusion or stenosis of unspecified carotid arteries: Secondary | ICD-10-CM

## 2012-12-22 NOTE — Telephone Encounter (Signed)
I called pt and told him sleep study shows OSA He would benefit from an oral appliance We will refer to dr Althea Grimmer

## 2012-12-22 NOTE — Patient Instructions (Signed)
Follow up in office in 6 months.  We are ordering transcranial dopplers and carotid dopplers.  STROKE/TIA INSTRUCTIONS SMOKING Cigarette smoking nearly doubles your risk of having a stroke & is the single most alterable risk factor  If you smoke or have smoked in the last 12 months, you are advised to quit smoking for your health.  Most of the excess cardiovascular risk related to smoking disappears within a year of stopping.  Ask you doctor about anti-smoking medications  [place] Quit Line: 1-800-QUIT NOW  Free Smoking Cessation Classes [phone]  CHOLESTEROL Know your levels; limit fat & cholesterol in your diet  Lab Results  Component Value Date   CHOL 135 12/01/2012   HDL 49 12/01/2012   LDLCALC 64 12/01/2012   TRIG 111 12/01/2012   CHOLHDL 2.8 12/01/2012      Many patients benefit from treatment even if their cholesterol is at goal.  Goal: Total Cholesterol less than 160  Goal:  LDL less than 100  Goal:  HDL greater than 40  Goal:  Triglycerides less than 150  BLOOD PRESSURE American Stroke Association blood pressure target is less that 120/80 mm/Hg  Your discharge blood pressure is:  BP: 151/65 mmHg  Monitor your blood pressure  Limit your salt and alcohol intake  Many individuals will require more than one medication for high blood pressure  DIABETES (A1c is a blood sugar average for last 3 months) Goal A1c is under 7% (A1c is blood sugar average for last 3 months)  Diabetes: No known diagnosis of diabetes    Lab Results  Component Value Date   HGBA1C  Value: 5.6 (NOTE)                                                                       According to the ADA Clinical Practice Recommendations for 2011, when HbA1c is used as a screening test:   >=6.5%   Diagnostic of Diabetes Mellitus           (if abnormal result  is confirmed)  5.7-6.4%   Increased risk of developing Diabetes Mellitus  References:Diagnosis and Classification of Diabetes Mellitus,Diabetes  Care,2011,34(Suppl 1):S62-S69 and Standards of Medical Care in         Diabetes - 2011,Diabetes Care,2011,34  (Suppl 1):S11-S61. 03/10/2010    Your A1c can be lowered with medications, healthy diet, and exercise.  Check your blood sugar as directed by your physician  Call your physician if you experience unexplained or low blood sugars.  PHYSICAL ACTIVITY/REHABILITATION Goal is 30 minutes at least 4 days per week    Activity decreases your risk of heart attack and stroke and makes your heart stronger.  It helps control your weight and blood pressure; helps you relax and can improve your mood.  Participate in a regular exercise program.  Talk with your doctor about the best form of exercise for you (dancing, walking, swimming, cycling).  DIET/WEIGHT Goal is to maintain a healthy weight  Your height is:  Height: 6\' 2"  (188 cm) Your current weight is: Weight: 229 lb (103.874 kg) Your body Mass Index (BMI) is:  BMI (Calculated): 29.5  Following the type of diet specifically designed for you will help prevent another stroke.  Your goal Body  Mass Index (BMI) is 19-24.  Healthy food habits can help reduce 3 risk factors for stroke:  High cholesterol, hypertension, and excess weight.

## 2012-12-22 NOTE — Telephone Encounter (Signed)
alida referred pt to dr Caren Hazy

## 2012-12-22 NOTE — Progress Notes (Signed)
[facility]  PATIENT: Tanner [name] DOB: 1934/08/17   HISTORY FROM: patient, chart REASON FOR VISIT: follow up  HISTORY OF PRESENT ILLNESS:  [name] is a 78 year old Caucasian gentleman who comes in for follow up of stroke in August 2011.  His symptoms were left peripheral vision loss.  MRI revealed an infarct in the right PCA/MCA watershed distribution thought to be secondary to right ICA occlusion.  CT angio of the neck showed occluded right common carotid artery at its origin.  Retrograde opacification of the right external carotid artery, approximately 50% stenosis of the left internal carotid artery just distal to the bulb.  CT angio of the brain showed occluded right internal carotid with partial retrograde constitution from the left internal carotid artery via the posterior cerebral artery. Opacification of the right middle and the and the right anterior cerebral arteries from the left internal carotid via the anterior communicating artery. Nonvisualization of the right posterior cerebral artery at its origin consistent with occlusion.  His risk factors are hyperlipidemia, hypertension and previous smoker.  He was discharged from the hospital on Plavix and Aspirin.   Update 12/22/12:  Patient comes in for follow up for stroke, has not been seen for over 2 years.  There was miscommunication about how often he should follow up.  He has been doing well, his PCP manages his hypertension and hyperlipidemia closely and they are well controlled.  He was feeling more fatigued recently and was found to be hypothyroid, and medication was increased.  He reports exercising with weight -bearing exercise 3 days a week.  He recently had a sleep study at [facility] and was dx with OSA.  He has an appointment to be fitted with a dental appliance with Dr. Myrtis [name].  He has been maintained for secondary stroke prevention with Plavix 75 mg daily.  He denies medication side  effects, with no signs of bleeding.    REVIEW OF SYSTEMS: Full 14 system review of systems performed and notable only for: respiratory: snoring ear/nose/throat: Hearing loss, ringing in the ears sleep: snoring, restless legs (sometimes)    ALLERGIES: No Known Allergies  HOME MEDICATIONS: Outpatient Prescriptions Prior to Visit  Medication Sig Dispense Refill  . b complex vitamins tablet Take 1 tablet by mouth daily.      . chlorthalidone (HYGROTON) 25 MG tablet Take 1 tablet (25 mg total) by mouth daily.  90 tablet  3  . Cholecalciferol (VITAMIN D PO) Take 1 tablet by mouth daily.      . clopidogrel (PLAVIX) 75 MG tablet Take 1 tablet (75 mg total) by mouth daily. With a meal  Generic please  90 tablet  3  . Coenzyme Q10 (CO Q-10) 300 MG CAPS Take 1 capsule by mouth daily.      [name] levothyroxine (SYNTHROID, LEVOTHROID) 50 MCG tablet Take 1 tablet (50 mcg total) by mouth daily before breakfast.  90 tablet  0  . OVER THE COUNTER MEDICATION Mega red 500 mg once daily      . ramipril (ALTACE) 10 MG capsule Take 1 capsule (10 mg total) by mouth 2 (two) times daily.  180 capsule  3  . rosuvastatin (CRESTOR) 40 MG tablet Take 1 tablet (40 mg total) by mouth daily.  90 tablet  3  . STUDY MEDICATION Pioglitazone- IRIS test- through Dr Tanner [name]       No facility-administered medications prior to visit.    PAST MEDICAL HISTORY: Past Medical History  Diagnosis Date  . Allergy     seasonal- mostly spring  . TOBACCO ABUSE, HX OF 03/17/2010  . Overweight(278.02) 07/01/2010  . Personal history of other infectious and parasitic disease 03/17/2010  . Mixed hyperlipidemia 03/17/2010  . HYPERKALEMIA 05/27/2010  . FATIGUE 03/17/2010  . CHICKENPOX, HX OF 03/17/2010  . CEREBROVASCULAR ACCIDENT 03/17/2010  . CAROTID ARTERY OCCLUSION, WITH INFARCTION 03/17/2010  . ASTHMA, CHILDHOOD 03/17/2010  . Hearing loss 11/04/2010    bilateral  . Anemia 02/03/2011  . Hypertension   . Tubular adenoma of colon 06/10/2011   . Hypothyroid 12/08/2011  . BCC (basal cell carcinoma) 12/08/2011  . Thrombocytopenia 04/05/2012  . Sleep apnea     PAST SURGICAL HISTORY: Past Surgical History  Procedure Laterality Date  . Tonsillectomy    . Pilonidal cyst excision  1968    removed  . Knee cartliage repair  1964, 1990    bilateral  . Rotator cuff repair      right    FAMILY HISTORY: Family History  Problem Relation Age of Onset  . Alzheimer's disease Mother   . Emphysema Sister     cigarettes  . Heart disease Son   . Coronary artery disease Brother   . Heart attack Brother   . Other Brother     heart problems- from fathers side  . Cancer Sister     gyn  . Bipolar disorder Daughter   . Obesity Daughter   . Heart disease Sister     SOCIAL HISTORY: History   Social History  . Marital Status: Divorced    Spouse Name: N/A    Number of Children: 3  . Years of Education: 66yrs   Occupational History  . retired    Social History Main Topics  . Smoking status: Former Smoker -- 2.00 packs/day for 20 years    Types: Cigarettes    Quit date: 07/19/1977  . Smokeless tobacco: Never Used  . Alcohol Use: 4.2 oz/week    7 Glasses of wine per week     Comment: occasionally  . Drug Use: No  . Sexually Active: No   Other Topics Concern  . Not on file   Social History Narrative   Pt lives at home alone.    Caffeine Use: very little     PHYSICAL EXAM  Filed Vitals:   12/22/12 1308  BP: 151/65  Pulse: 60  Temp: 97.2 F (36.2 C)  TempSrc: Oral  Height: 6\' 2"  (1.88 m)  Weight: 229 lb (103.874 kg)   Body mass index is 29.39 kg/(m^2).  GENERAL EXAM: Patient is in no distress, well developed and well groomed. HEAD: Symmetric facial features. EARS, NOSE, and THROAT: Normal.  NECK: Supple, no JVD RESPIRATORY: Lungs CTA. CARDIOVASCULAR: Regular rate and rhythm, no murmurs, bilateral carotid bruits, right vertebral bruit SKIN: No rash, no bruising  NEUROLOGIC: MENTAL STATUS: awake, alert  and oriented to person, place and time, language fluent, comprehension intact, naming intact CRANIAL NERVE: pupils equal and reactive to light, visual fields full to confrontation, extraocular muscles intact, no nystagmus, facial sensation and strength symmetric, uvula midline, shoulder shrug symmetric, tongue midline. MOTOR: normal bulk and tone, full strength in the BUE, BLE, fine finger movements normal SENSORY: normal and symmetric to light touch, pinprick, temperature, vibration COORDINATION: normal finger-nose-finger REFLEXES: deep tendon reflexes present and symmetric 2+,  GAIT/STATION: narrow based gait; able to walk on toes, heels and tandem; romberg is negative. No assistive device. Falls assessment tool score 5 indicates lobe moderate  fall risk.   DIAGNOSTIC DATA (LABS, IMAGING, TESTING) - I reviewed patient records, labs, notes, testing and imaging myself where available.  Lab Results  Component Value Date   WBC 4.8 12/20/2012   HGB 11.8* 12/20/2012   HCT 34.0* 12/20/2012   MCV 91.9 12/20/2012   PLT 143* 12/20/2012      Component Value Date/Time   NA 138 12/01/2012 1152   K 4.8 12/01/2012 1152   CL 101 12/01/2012 1152   CO2 29 12/01/2012 1152   GLUCOSE 92 12/01/2012 1152   BUN 22 12/01/2012 1152   CREATININE 1.20 12/01/2012 1152   CREATININE 1.1 03/29/2012 0818   CALCIUM 10.2 12/01/2012 1152   PROT 7.0 12/01/2012 1152   ALBUMIN 4.6 12/01/2012 1152   ALBUMIN 4.6 12/01/2012 1152   AST 21 12/01/2012 1152   ALT 13 12/01/2012 1152   ALKPHOS 54 12/01/2012 1152   BILITOT 0.7 12/01/2012 1152   GFRNONAA 66.85 06/04/2010 1218   GFRAA  Value: >60        The eGFR has been calculated using the MDRD equation. This calculation has not been validated in all clinical situations. eGFR's persistently <60 mL/min signify possible Chronic Kidney Disease. 03/10/2010 2047   Lab Results  Component Value Date   CHOL 135 12/01/2012   HDL 49 12/01/2012   LDLCALC 64 12/01/2012   TRIG 111 12/01/2012   CHOLHDL 2.8  12/01/2012   Lab Results  Component Value Date   HGBA1C  Value: 5.6 (NOTE)                                                                       According to the ADA Clinical Practice Recommendations for 2011, when HbA1c is used as a screening test:   >=6.5%   Diagnostic of Diabetes Mellitus           (if abnormal result  is confirmed)  5.7-6.4%   Increased risk of developing Diabetes Mellitus  References:Diagnosis and Classification of Diabetes Mellitus,Diabetes Care,2011,34(Suppl 1):S62-S69 and Standards of Medical Care in         Diabetes - 2011,Diabetes Care,2011,34  (Suppl 1):S11-S61. 03/10/2010   Lab Results  Component Value Date   VITAMINB12 488 12/20/2012   Lab Results  Component Value Date   TSH 7.873* 12/01/2012     ASSESSMENT AND PLAN  Mr. Hatlestad is a 78 year old right-handed Caucasian male here for followup of stroke in August 2011. He suffered an infarct in the right PCA/MCA in a watershed distribution, thought to be secondary to right ICA occlusion. CT angiogram of the neck in 02/2010 showed occluded right common carotid artery at its origin and retrograde flow of the right external carotid artery with approximately 50% stenosis of the left internal carotid artery.  His only effects of the stroke was decreased left peripheral vision which is improved.  Continue clopidogrel 75 mg orally every day  for secondary stroke prevention and maintain strict control of hypertension with blood pressure goal below 140/90and  lipids with LDL cholesterol goal below 100 mg/dL.  We will order Carotid dopplers and transcranial dopplers. Lipid panel to be checked every 6 months. Followup in 6 months.   LYNN LAM NP-C 12/22/2012, 1:55 PM  [facility] [address], [address] [place], [place] [address] [phone]  I have personally examined this patient, reviewed pertinent data, developed plan of care and discussed with patient and agree with above.  Delia Heady, MD

## 2012-12-23 LAB — INTRINSIC FACTOR ANTIBODIES: Intrinsic Factor: NEGATIVE

## 2012-12-28 ENCOUNTER — Telehealth: Payer: Self-pay | Admitting: Critical Care Medicine

## 2012-12-28 NOTE — Telephone Encounter (Signed)
Will call dr Myrtis Ser office on Thursday am Tobe Sos

## 2012-12-29 NOTE — Telephone Encounter (Signed)
Spoke to [employer] office she will call pt right now to set up his appt Tanner [name]

## 2013-01-03 ENCOUNTER — Ambulatory Visit (INDEPENDENT_AMBULATORY_CARE_PROVIDER_SITE_OTHER): Payer: Medicare Other

## 2013-01-03 DIAGNOSIS — I63219 Cerebral infarction due to unspecified occlusion or stenosis of unspecified vertebral arteries: Secondary | ICD-10-CM

## 2013-01-03 DIAGNOSIS — E785 Hyperlipidemia, unspecified: Secondary | ICD-10-CM

## 2013-01-03 DIAGNOSIS — I63239 Cerebral infarction due to unspecified occlusion or stenosis of unspecified carotid arteries: Secondary | ICD-10-CM

## 2013-01-03 DIAGNOSIS — Z0289 Encounter for other administrative examinations: Secondary | ICD-10-CM

## 2013-01-12 ENCOUNTER — Ambulatory Visit (INDEPENDENT_AMBULATORY_CARE_PROVIDER_SITE_OTHER): Payer: Medicare Other | Admitting: Cardiovascular Disease

## 2013-01-12 VITALS — BP 143/75 | HR 57 | Wt 229.0 lb

## 2013-01-12 DIAGNOSIS — R0989 Other specified symptoms and signs involving the circulatory and respiratory systems: Secondary | ICD-10-CM

## 2013-01-12 DIAGNOSIS — E782 Mixed hyperlipidemia: Secondary | ICD-10-CM

## 2013-01-12 DIAGNOSIS — R55 Syncope and collapse: Secondary | ICD-10-CM

## 2013-01-12 DIAGNOSIS — R0600 Dyspnea, unspecified: Secondary | ICD-10-CM

## 2013-01-12 DIAGNOSIS — I63239 Cerebral infarction due to unspecified occlusion or stenosis of unspecified carotid arteries: Secondary | ICD-10-CM

## 2013-01-12 DIAGNOSIS — I10 Essential (primary) hypertension: Secondary | ICD-10-CM

## 2013-01-12 DIAGNOSIS — G4733 Obstructive sleep apnea (adult) (pediatric): Secondary | ICD-10-CM

## 2013-01-12 NOTE — Assessment & Plan Note (Signed)
Well controlled.  Continue current medications and low sodium Dash type diet.  Will see what BP does with exercise

## 2013-01-12 NOTE — Patient Instructions (Signed)
Your physician recommends that you schedule a follow-up appointment in: AS NEEDED  Your physician recommends that you continue on your current medications as directed. Please refer to the Current Medication list given to you today.  Your physician has requested that you have an exercise tolerance test. For further information please visit [email or web address]. Please also follow instruction sheet, as given.  Your physician has requested that you have an echocardiogram. Echocardiography is a painless test that uses sound waves to create images of your heart. It provides your doctor with information about the size and shape of your heart and how well your heart's chambers and valves are working. This procedure takes approximately one hour. There are no restrictions for this procedure.   

## 2013-01-12 NOTE — Assessment & Plan Note (Signed)
Cholesterol is at goal.  Continue current dose of statin and diet Rx.  No myalgias or side effects.  F/U  LFT's in 6 months. Lab Results  Component Value Date   LDLCALC 64 12/01/2012

## 2013-01-12 NOTE — Assessment & Plan Note (Signed)
Has vascular diseae, and HTN Will do ETT to r/o anginal equivalent and make sure BP controlled with exercise

## 2013-01-12 NOTE — Assessment & Plan Note (Signed)
F/U Dr Myrtis Ser for mouth piece Echo for dyspnea and assess RV function and PA pressures given pulmonary disease pattern on ECG

## 2013-01-12 NOTE — Progress Notes (Signed)
Patient ID: Tanner [name], male   DOB: 01-05-1935, 78 y.o.   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 Referred for PVC, HTN dyspnea and dizzyness. He has been Rx for BP for a long time. Previous TIA on plavix and followed by Pearlean Brownie.  Indicates occluded RICA and just had duplex on left.  Has more fatigue lately with exertional dyspnea.  No chest pain per say.  Compliant with meds. Occasional dizzyness that is not always postural but only lasts seconds. No syncope.  Quit smoking in 1976-12-15.  Father died of a stroke.  No previously documented CAD or LE vascular disease. ECG read by computer as ? Pulmonary disease pattern and has seen Dr Delford Field Recent diagnosis of OSA and looking into dental implant as he will not wear mask  ROS: Denies fever, malais, weight loss, blurry vision, decreased visual acuity, cough, sputum, SOB, hemoptysis, pleuritic pain, palpitaitons, heartburn, abdominal pain, melena, lower extremity edema, claudication, or rash.  All other systems reviewed and negative   General: Affect appropriate Healthy:  appears stated age HEENT: normal Neck supple with no adenopathy JVP normal bilateral bruits no thyromegaly Lungs clear with no wheezing and good diaphragmatic motion Heart:  S1/S2 no murmur,rub, gallop or click PMI normal Abdomen: benighn, BS positve, no tenderness, no AAA no bruit.  No HSM or HJR Distal pulses intact with no bruits No edema Neuro non-focal Skin warm and dry No muscular weakness  Medications Current Outpatient Prescriptions  Medication Sig Dispense Refill  . b complex vitamins tablet Take 1 tablet by mouth daily.      . chlorthalidone (HYGROTON) 25 MG tablet Take 1 tablet (25 mg total) by mouth daily.  90 tablet  3  . Cholecalciferol (VITAMIN D PO) Take 1 tablet by mouth daily.      . clopidogrel (PLAVIX) 75 MG tablet Take 1 tablet (75 mg total) by mouth daily. With a meal  Generic please  90 tablet  3  . Coenzyme Q10 (CO Q-10) 300 MG CAPS Take 1  capsule by mouth daily.      [name] levothyroxine (SYNTHROID, LEVOTHROID) 50 MCG tablet Take 1 tablet (50 mcg total) by mouth daily before breakfast.  90 tablet  0  . NON FORMULARY Study Drug 1 tab po qd      . OVER THE COUNTER MEDICATION Mega red 500 mg once daily      . Probiotic Product (PROBIOTIC PO) Take 1 tablet by mouth daily.      . ramipril (ALTACE) 10 MG capsule Take 1 capsule (10 mg total) by mouth 2 (two) times daily.  180 capsule  3  . rosuvastatin (CRESTOR) 40 MG tablet Take 1 tablet (40 mg total) by mouth daily.  90 tablet  3   No current facility-administered medications for this visit.    Allergies Review of patient's allergies indicates no known allergies.  Family History: Family History  Problem Relation Age of Onset  . Alzheimer's disease Mother   . Emphysema Sister     cigarettes  . Heart disease Son   . Coronary artery disease Brother   . Heart attack Brother   . Other Brother     heart problems- from fathers side  . Cancer Sister     gyn  . Bipolar disorder Daughter   . Obesity Daughter   . Heart disease Sister     Social History: History   Social History  . Marital Status: Divorced    Spouse Name: N/A  Number of Children: 3  . Years of Education: 84yrs   Occupational History  . retired    Social History Main Topics  . Smoking status: Former Smoker -- 2.00 packs/day for 20 years    Types: Cigarettes    Quit date: 07/19/1977  . Smokeless tobacco: Never Used  . Alcohol Use: 4.2 oz/week    7 Glasses of wine per week     Comment: occasionally  . Drug Use: No  . Sexually Active: No   Other Topics Concern  . Not on file   Social History Narrative   Pt lives at home alone.    Caffeine Use: very little    Electrocardiogram: 5/14  SR pulmonary disease pattern  Assessment and Plan

## 2013-01-12 NOTE — Assessment & Plan Note (Signed)
Frustrated because he had duplex 2 weeks ago at [facility] and has not been contacted with results.  Continue ASA and Plavix

## 2013-01-16 ENCOUNTER — Telehealth: Payer: Self-pay | Admitting: Neurology

## 2013-01-17 ENCOUNTER — Other Ambulatory Visit: Payer: Self-pay | Admitting: Cardiovascular Disease

## 2013-01-17 DIAGNOSIS — I779 Disorder of arteries and arterioles, unspecified: Secondary | ICD-10-CM

## 2013-01-17 NOTE — Telephone Encounter (Signed)
Patient requesting TCD results.

## 2013-01-18 NOTE — Progress Notes (Signed)
Tanner [name],  the patient`s dopplers are unchanged from prior studies and show unexpected findings. He does not need revascularization at present time in the absence of clinical new symptoms or progression of stenosis.I spoke to the patient today and went over results. Tanner [name]

## 2013-01-18 NOTE — Telephone Encounter (Signed)
Spoke to patient and explained reason for delay in calling him with CUS/TCD results was me being on vacation and my NP wanting my opinion about them. I am the only MD reading TCD studies .He seemed understanding. Overall no significant changes in CUS and TCD shows expected findings. Will need 6 monthly doppler studies.

## 2013-01-19 ENCOUNTER — Telehealth: Payer: Self-pay | Admitting: Vascular Surgery

## 2013-01-19 ENCOUNTER — Ambulatory Visit (INDEPENDENT_AMBULATORY_CARE_PROVIDER_SITE_OTHER)
Admission: RE | Admit: 2013-01-19 | Discharge: 2013-01-19 | Disposition: A | Discharge status: Home / Self Care | Payer: Medicare Other | Source: Ambulatory Visit | Attending: Cardiovascular Disease | Admitting: Cardiovascular Disease

## 2013-01-19 DIAGNOSIS — I779 Disorder of arteries and arterioles, unspecified: Secondary | ICD-10-CM

## 2013-01-19 MED ORDER — IOHEXOL 350 MG/ML SOLN
100.0000 mL | Freq: Once | INTRAVENOUS | Status: AC | PRN
  Administered 2013-01-19: 80 mL via INTRAVENOUS

## 2013-01-19 NOTE — Telephone Encounter (Addendum)
Message copied by Rosalyn Charters on Thu Jan 19, 2013 12:54 PM ------      Message from: Lamar Blinks S      Created: Thu Jan 19, 2013 11:25 AM      Regarding: needs office appt. ASAP with TFE or CSD       Per Dr. Eden Emms, schedule the above pt. for office consult ASAP with CSD or TFE (he has placed a call for TFE to call him back re: this pt.) Has occlusion of Right carotid, and high grade stenosis of left carotid. Pt. had a CTA of neck 7/3, so no duplex needed.  Dr. Eden Emms requests that we get pt. Scheduled, even though he hasn't talked w/ TFE yet.   ------  Left message for patient to call back for appt. info 01-19-13 12:54

## 2013-01-23 ENCOUNTER — Encounter: Payer: Self-pay | Admitting: Vascular Surgery

## 2013-01-24 ENCOUNTER — Encounter: Payer: Self-pay | Admitting: Vascular Surgery

## 2013-01-24 ENCOUNTER — Ambulatory Visit (INDEPENDENT_AMBULATORY_CARE_PROVIDER_SITE_OTHER): Payer: Medicare Other | Admitting: Vascular Surgery

## 2013-01-24 DIAGNOSIS — I6529 Occlusion and stenosis of unspecified carotid artery: Secondary | ICD-10-CM

## 2013-01-24 NOTE — Progress Notes (Signed)
[facility] [place]   Patient name: Tanner [name] MRN: [id number] DOB: 10/05/1934 Sex: male   Referred by: Nishan  Reason for referral:  Chief Complaint  Patient presents with  . 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 He has a history in 2011 of visual disturbances. He was evaluated by his physician who saw no specific ocular problems. He underwent further workup to include hospitalization with MRI and CT. This did reveal complete occlusion of his right common carotid artery extending into his internal carotid artery. At that time he was found to have a moderate left carotid stenosis. He is left-handed. Fortunately he did not have any left body symptoms and all of his symptoms were visual disturbances. He recently had followup ultrasounds showing some progression on his left carotid and then underwent CT angiogram for further evaluation. I am seeing him today for further discussion of this. He specifically denies any left brain symptoms. He has had no right sided weakness. He does not have any specific cardiac issues. He is scheduled for a stress test on 02/08/2013.  Past Medical History  Diagnosis Date  . Allergy     seasonal- mostly spring  . TOBACCO ABUSE, HX OF 03/17/2010  . Overweight(278.02) 07/01/2010  . Personal history of other infectious and parasitic disease 03/17/2010  . Mixed hyperlipidemia 03/17/2010  . HYPERKALEMIA 05/27/2010  . FATIGUE 03/17/2010  . CHICKENPOX, HX OF 03/17/2010  . CEREBROVASCULAR ACCIDENT 03/17/2010  . CAROTID ARTERY OCCLUSION, WITH INFARCTION 03/17/2010  . ASTHMA, CHILDHOOD 03/17/2010  . Hearing loss 11/04/2010    bilateral  . Anemia 02/03/2011  . Hypertension   . Tubular adenoma of colon 06/10/2011  . Hypothyroid 12/08/2011  . BCC (basal cell carcinoma) 12/08/2011  .  Thrombocytopenia 04/05/2012  . Sleep apnea     Past Surgical History  Procedure Laterality Date  . Tonsillectomy    . Pilonidal cyst excision  1968    removed  . Knee cartliage repair  1964, 1990    bilateral  . Rotator cuff repair      right    History   Social History  . Marital Status: Divorced    Spouse Name: N/A    Number of Children: 3  . Years of Education: 16yrs   Occupational History  . retired    Social History Main Topics  . Smoking status: Former Smoker -- 2.00 packs/day for 20 years    Types: Cigarettes    Quit date: 07/19/1977  . Smokeless tobacco: Never Used  . Alcohol Use: 4.2 oz/week    7 Glasses of wine per week     Comment: occasionally  . Drug Use: No  . Sexually Active: No   Other Topics Concern  . Not on file   Social History Narrative   Pt lives at home alone.    Caffeine Use: very little    Family History  Problem Relation Age of Onset  . Alzheimer's disease Mother   . Emphysema Sister     cigarettes  . Heart disease Son   . Coronary artery disease Brother   . Heart attack Brother   . Other Brother     heart problems- from fathers side  . Cancer Sister     gyn  . Bipolar disorder Daughter   . Obesity Daughter   . Heart disease Sister       Allergies as of 01/24/2013  . (No Known Allergies)    Current Outpatient Prescriptions on File Prior to Visit  Medication Sig Dispense Refill  . b complex vitamins tablet Take 1 tablet by mouth daily.      . chlorthalidone (HYGROTON) 25 MG tablet Take 1 tablet (25 mg total) by mouth daily.  90 tablet  3  . Cholecalciferol (VITAMIN D PO) Take 1 tablet by mouth daily.      . clopidogrel (PLAVIX) 75 MG tablet Take 1 tablet (75 mg total) by mouth daily. With a meal  Generic please  90 tablet  3  . Coenzyme Q10 (CO Q-10) 300 MG CAPS Take 1 capsule by mouth daily.      . levothyroxine (SYNTHROID, LEVOTHROID) 50 MCG tablet Take 1 tablet (50 mcg total) by mouth daily before breakfast.  90  tablet  0  . NON FORMULARY Study Drug 1 tab po qd      . OVER THE COUNTER MEDICATION Mega red 500 mg once daily      . Probiotic Product (PROBIOTIC PO) Take 1 tablet by mouth daily.      . ramipril (ALTACE) 10 MG capsule Take 1 capsule (10 mg total) by mouth 2 (two) times daily.  180 capsule  3  . rosuvastatin (CRESTOR) 40 MG tablet Take 1 tablet (40 mg total) by mouth daily.  90 tablet  3   No current facility-administered medications on file prior to visit.     REVIEW OF SYSTEMS:  Positives indicated with an "X"  CARDIOVASCULAR:  [ ] chest pain   [ ] chest pressure   [ ] palpitations   [ ] orthopnea   [ ] dyspnea on exertion   [ ] claudication   [ ] rest pain   [ ] DVT   [ ] phlebitis PULMONARY:   [ ] productive cough   [ ] asthma   [ ] wheezing NEUROLOGIC:   [ ] weakness  [ ] paresthesias  [ ] aphasia  [ ] amaurosis  [ ] dizziness HEMATOLOGIC:   [ ] bleeding problems   [ ] clotting disorders MUSCULOSKELETAL:  [ ] joint pain   [ ] joint swelling GASTROINTESTINAL: [ ]  blood in stool  [ ]  hematemesis GENITOURINARY:  [ ]  dysuria  [ ]  hematuria PSYCHIATRIC:  [ ] history of major depression INTEGUMENTARY:  [ ] rashes  [ ] ulcers CONSTITUTIONAL:  [ ] fever   [ ] chills  PHYSICAL EXAMINATION:  General: The patient is a well-nourished male, in no acute distress. Vital signs are BP 150/83  Pulse 60  Resp 16  Ht 6' 2" (1.88 m)  Wt 224 lb (101.606 kg)  BMI 28.75 kg/m2 Pulmonary: There is a good air exchange bilaterally without wheezing or rales. Abdomen: Soft and non-tender with normal pitch bowel sounds. No aneurysm palpable Musculoskeletal: There are no major deformities.  There is no significant extremity pain. Neurologic: No focal weakness or paresthesias are detected, Skin: There are no ulcer or rashes noted. Psychiatric: The patient has normal affect. Cardiovascular: There is a regular rate and rhythm without significant murmur appreciated. Pulse status: 2+ radial 2+  femoral and 2+ dorsalis pedis pulses bilaterally Carotid arteries: Left carotid bruit    CT angiogram of his neck from 01/19/2013 was reviewed. I reviewed the actual films and discussed them with the patient as well. This does show occlusion of his right common carotid internal carotid artery. He has a   focal high-grade stenosis of his proximal internal carotid artery with a widely patent internal carotid artery distal to this.  Impression and Plan:  Severe left internal carotid artery stenosis, asymptomatic which is contralateral to a right carotid occlusion. I have recommended endarterectomy for reduction of stroke risk. The procedure including 1-2% risk of stroke with the procedure. I've also explained the remote chance of bleeding infection or cranial nerve injury. He is scheduled for cardiac stress test on 02/08/2013 and we will schedule his surgery following this assuming no cardiac issues. He is tentatively scheduled for left carotid endarterectomy on 02/13/2013    Hudsyn Barich [facility] [place] Office: [phone]         

## 2013-01-31 ENCOUNTER — Other Ambulatory Visit: Payer: Self-pay | Admitting: *Deleted

## 2013-01-31 ENCOUNTER — Encounter (HOSPITAL_COMMUNITY): Payer: Self-pay | Admitting: Pharmacy Technician

## 2013-02-06 NOTE — Pre-Procedure Instructions (Signed)
Nicholi Ghuman Ferrara  02/06/2013   Your procedure is scheduled on:  Monday, July 28th   Report to [facility] at  5:30 AM.  Call this number if you have problems the morning of surgery: [phone]   Remember:   Do not eat food or drink liquids after midnight Sunday.   Take these medicines the morning of surgery with A SIP OF WATER: Levothyroxine   Do not wear jewelry.  Do not wear lotions, powders, or colognes. You may NOT wear deodorant.  Men may shave face and neck.   Do not bring valuables to the hospital.  [facility] is not responsible for any belongings or valuables.  Contacts, dentures or bridgework may not be worn into surgery.   Leave suitcase in the car. After surgery it may be brought to your room.  For patients admitted to the hospital, checkout time is 11:00 AM the day of discharge.   Name and phone number of your driver:    Special Instructions: Shower using CHG 2 nights before surgery and the night before surgery.  If you shower the day of surgery use CHG.  Use special wash - you have one bottle of CHG for all showers.  You should use approximately 1/3 of the bottle for each shower.   Please read over the following fact sheets that you were given: Pain Booklet, Coughing and Deep Breathing, Blood Transfusion Information, MRSA Information and Surgical Site Infection Prevention

## 2013-02-07 ENCOUNTER — Ambulatory Visit (INDEPENDENT_AMBULATORY_CARE_PROVIDER_SITE_OTHER): Payer: Medicare Other | Admitting: Nurse Practitioner

## 2013-02-07 ENCOUNTER — Encounter (HOSPITAL_COMMUNITY)
Admission: RE | Admit: 2013-02-07 | Discharge: 2013-02-07 | Disposition: A | Discharge status: Home / Self Care | Payer: Medicare Other | Source: Ambulatory Visit | Attending: Vascular Surgery | Admitting: Vascular Surgery

## 2013-02-07 ENCOUNTER — Encounter (HOSPITAL_COMMUNITY): Payer: Self-pay

## 2013-02-07 ENCOUNTER — Encounter: Payer: Self-pay | Admitting: Nurse Practitioner

## 2013-02-07 ENCOUNTER — Ambulatory Visit (HOSPITAL_BASED_OUTPATIENT_CLINIC_OR_DEPARTMENT_OTHER): Payer: Medicare Other

## 2013-02-07 ENCOUNTER — Ambulatory Visit (HOSPITAL_COMMUNITY)
Admission: RE | Admit: 2013-02-07 | Discharge: 2013-02-07 | Disposition: A | Discharge status: Home / Self Care | Payer: Medicare Other | Source: Ambulatory Visit | Attending: Anesthesiology | Admitting: Anesthesiology

## 2013-02-07 VITALS — BP 151/70

## 2013-02-07 DIAGNOSIS — Z87891 Personal history of nicotine dependence: Secondary | ICD-10-CM | POA: Insufficient documentation

## 2013-02-07 DIAGNOSIS — Z0183 Encounter for blood typing: Secondary | ICD-10-CM | POA: Insufficient documentation

## 2013-02-07 DIAGNOSIS — I080 Rheumatic disorders of both mitral and aortic valves: Secondary | ICD-10-CM | POA: Insufficient documentation

## 2013-02-07 DIAGNOSIS — I10 Essential (primary) hypertension: Secondary | ICD-10-CM

## 2013-02-07 DIAGNOSIS — I379 Nonrheumatic pulmonary valve disorder, unspecified: Secondary | ICD-10-CM | POA: Insufficient documentation

## 2013-02-07 DIAGNOSIS — Z01812 Encounter for preprocedural laboratory examination: Secondary | ICD-10-CM | POA: Insufficient documentation

## 2013-02-07 DIAGNOSIS — Z01818 Encounter for other preprocedural examination: Secondary | ICD-10-CM

## 2013-02-07 DIAGNOSIS — I079 Rheumatic tricuspid valve disease, unspecified: Secondary | ICD-10-CM | POA: Insufficient documentation

## 2013-02-07 DIAGNOSIS — E785 Hyperlipidemia, unspecified: Secondary | ICD-10-CM | POA: Insufficient documentation

## 2013-02-07 DIAGNOSIS — R0609 Other forms of dyspnea: Secondary | ICD-10-CM

## 2013-02-07 DIAGNOSIS — R0989 Other specified symptoms and signs involving the circulatory and respiratory systems: Secondary | ICD-10-CM

## 2013-02-07 DIAGNOSIS — R55 Syncope and collapse: Secondary | ICD-10-CM

## 2013-02-07 DIAGNOSIS — I6529 Occlusion and stenosis of unspecified carotid artery: Secondary | ICD-10-CM | POA: Insufficient documentation

## 2013-02-07 DIAGNOSIS — R0602 Shortness of breath: Secondary | ICD-10-CM

## 2013-02-07 DIAGNOSIS — R0600 Dyspnea, unspecified: Secondary | ICD-10-CM

## 2013-02-07 DIAGNOSIS — G4730 Sleep apnea, unspecified: Secondary | ICD-10-CM | POA: Insufficient documentation

## 2013-02-07 LAB — CBC
MCV: 93.8 fL (ref 78.0–100.0)
Platelets: 157 10*3/uL (ref 150–400)
RDW: 15.5 % (ref 11.5–15.5)
WBC: 5.8 10*3/uL (ref 4.0–10.5)

## 2013-02-07 LAB — URINALYSIS, ROUTINE W REFLEX MICROSCOPIC
Ketones, ur: NEGATIVE mg/dL
Leukocytes, UA: NEGATIVE
Nitrite: NEGATIVE
Protein, ur: NEGATIVE mg/dL
pH: 6 (ref 5.0–8.0)

## 2013-02-07 LAB — TYPE AND SCREEN

## 2013-02-07 LAB — COMPREHENSIVE METABOLIC PANEL
AST: 19 U/L (ref 0–37)
Albumin: 4.2 g/dL (ref 3.5–5.2)
Chloride: 99 mEq/L (ref 96–112)
Creatinine, Ser: 1.17 mg/dL (ref 0.50–1.35)
Potassium: 4.7 mEq/L (ref 3.5–5.1)
Sodium: 137 mEq/L (ref 135–145)
Total Bilirubin: 0.5 mg/dL (ref 0.3–1.2)

## 2013-02-07 LAB — PROTIME-INR: INR: 0.99 (ref 0.00–1.49)

## 2013-02-07 LAB — SURGICAL PCR SCREEN
MRSA, PCR: NEGATIVE
Staphylococcus aureus: NEGATIVE

## 2013-02-07 LAB — APTT: aPTT: 33 seconds (ref 24–37)

## 2013-02-07 NOTE — Progress Notes (Signed)
Primary physician - Dr. Esperanza Sheets Cardiologist - Dr. Eden Emms Has stress test and echo today (02/07/2013) will await results of this.

## 2013-02-07 NOTE — Progress Notes (Signed)
Echocardiogram performed.  

## 2013-02-07 NOTE — Progress Notes (Deleted)
Exercise Treadmill Test  Pre-Exercise Testing Evaluation Rhythm: normal sinus  Rate: 62     Test  Exercise Tolerance Test Ordering MD: Charlton Haws, MD  Interpreting MD: Sunday Spillers  Unique Test No: 1  Treadmill:  1  Indication for ETT: dyspnea, presyncope, carotid artery stenosis Contraindication to ETT: No   Stress Modality: exercise - treadmill  Cardiac Imaging Performed: non   Protocol: standard Bruce - maximal  Max BP:  ***/***  Max MPHR (bpm):  144 85% MPR (bpm):  122  MPHR obtained (bpm):  *** % MPHR obtained:  ***  Reached 85% MPHR (min:sec):  *** Total Exercise Time (min-sec):  ***  Workload in METS:  *** Borg Scale: ***  Reason ETT Terminated:  {CHL REASON TERMINATED FOR WUJ:81191478}    ST Segment Analysis At Rest: {CHL ST SEGMENT AT REST FOR GNF:62130865} With Exercise: {CHL ST SEGMENT WITH EXERCISE FOR HQI:69629528}  Other Information Arrhythmia:  {CHL ARRHYTHMIA FOR UXL:24401027} Angina during ETT:  {CHL ANGINA DURING OZD:66440347} Quality of ETT:  {CHL QUALITY OF QQV:95638756}  ETT Interpretation:  {CHL INTERPRETATION FOR EPP:29518841}  Comments: ***  Recommendations: ***

## 2013-02-07 NOTE — Progress Notes (Signed)
Karey Stucki Epler Date of Birth: [birth year] Medical Record #[id number]  History of Present Illness: Mr. Tuberville is seen back today for what was to be a GXT. Seen for Dr. Eden Emms. He has HTN, PVCs and known PVC with carotid disease. No known CAD. Has had past TIA and remains on chronic Plavix.   Saw Dr. Eden Emms last month and referred for GXT due to his risk factors. He has had no chest pain. He has had progression of his carotid disease with a known occlusion on the right and now with 75 to 80% on the left. He has seen Dr. Arbie Cookey and has CEA planned for Monday. The patient has stopped exercising over the past few weeks - felt scared given his progression of carotid disease.   Current Outpatient Prescriptions  Medication Sig Dispense Refill  . aspirin EC 81 MG tablet Take 81 mg by mouth daily.      [name] b complex vitamins tablet Take 1 tablet by mouth daily.      . chlorthalidone (HYGROTON) 25 MG tablet Take 25 mg by mouth daily.      . Cholecalciferol (VITAMIN D PO) Take 2,000 mg by mouth daily.       . clopidogrel (PLAVIX) 75 MG tablet Take 75 mg by mouth daily. With a meal  Generic please      . Coenzyme Q10 (CO Q-10) 300 MG CAPS Take 1 capsule by mouth daily.      [name] levothyroxine (SYNTHROID, LEVOTHROID) 50 MCG tablet Take 50 mcg by mouth daily before breakfast.      . NON FORMULARY Take 1 tablet by mouth daily. Study Drug: Pioglitazone, and a placebo.  Take one tablet by mouth daily.      [name] OVER THE COUNTER MEDICATION Take 1 capsule by mouth daily. Mega red 500 mg once daily      . Probiotic Product (PROBIOTIC PO) Take 1 capsule by mouth daily.       . ramipril (ALTACE) 10 MG capsule Take 10 mg by mouth 2 (two) times daily.      . rosuvastatin (CRESTOR) 40 MG tablet Take 40 mg by mouth daily.       No current facility-administered medications for this visit.    No Known Allergies  Past Medical History  Diagnosis Date  . Allergy     seasonal- mostly spring  . TOBACCO ABUSE, HX OF  03/17/2010  . Overweight(278.02) 07/01/2010  . Personal history of other infectious and parasitic disease 03/17/2010  . Mixed hyperlipidemia 03/17/2010  . HYPERKALEMIA 05/27/2010  . FATIGUE 03/17/2010  . CHICKENPOX, HX OF 03/17/2010  . CAROTID ARTERY OCCLUSION, WITH INFARCTION 03/17/2010  . ASTHMA, CHILDHOOD 03/17/2010  . Hearing loss 11/04/2010    bilateral  . Anemia 02/03/2011  . Hypertension   . Tubular adenoma of colon 06/10/2011  . Hypothyroid 12/08/2011  . BCC (basal cell carcinoma) 12/08/2011  . Thrombocytopenia 04/05/2012  . Sleep apnea   . CEREBROVASCULAR ACCIDENT 03/17/2010    partial loss of peripheral vision on left    Past Surgical History  Procedure Laterality Date  . Tonsillectomy    . Pilonidal cyst excision  1968    removed  . Knee cartliage repair  1964, 1990    bilateral  . Rotator cuff repair      right    History  Smoking status  . Former Smoker -- 2.00 packs/day for 20 years  . Types: Cigarettes  . Quit date: 07/19/1977  Smokeless tobacco  . Never  Used    History  Alcohol Use  . 4.2 oz/week  . 7 Glasses of wine per week    Family History  Problem Relation Age of Onset  . Alzheimer's disease Mother   . Emphysema Sister     cigarettes  . Heart disease Son   . Coronary artery disease Brother   . Heart attack Brother   . Other Brother     heart problems- from fathers side  . Cancer Sister     gyn  . Bipolar disorder Daughter   . Obesity Daughter   . Heart disease Sister     Review of Systems: The review of systems is per the HPI.  All other systems were reviewed and are negative.  Physical Exam: BP 151/70  LABORATORY DATA: Dg Chest 2 View  02/07/2013    IMPRESSION: No abnormality noted.   Original Report Authenticated By: Bretta Bang, M.D.   Ct Angio Neck W/cm &/or Wo/cm  01/19/2013   IMPRESSION: No reconstitution of the occluded right common carotid artery and right internal carotid artery compared to 2011.  Progressive  atherosclerotic narrowing of the left internal carotid artery at its origin, potentially flow reducing at 75-80% stenosis. See discussion above.   Original Report Authenticated By: Davonna Belling, M.D.     Assessment / Plan:  Progressive carotid disease - for surgery on Monday with Dr. Arbie Cookey. I have talked with Dr. Shirlee Latch (DOD here in the office today). Felt to be at increased risk to walk on the treadmill given the nature/extent of his carotid disease and have been advised to schedule Lexiscan - this has been arranged for tomorrow morning.   Patient is agreeable to this plan and will call if any problems develop in the interim.   Rosalio Macadamia, RN, ANP-C [facility] [address] [place], [place]  [address]

## 2013-02-08 ENCOUNTER — Ambulatory Visit (HOSPITAL_COMMUNITY): Payer: Medicare Other | Attending: Cardiology | Admitting: Radiology

## 2013-02-08 ENCOUNTER — Encounter: Payer: Medicare Other | Admitting: Physician Assistant

## 2013-02-08 ENCOUNTER — Other Ambulatory Visit (HOSPITAL_COMMUNITY): Payer: Medicare Other

## 2013-02-08 VITALS — BP 130/70 | HR 51 | Ht 74.0 in | Wt 222.0 lb

## 2013-02-08 DIAGNOSIS — R55 Syncope and collapse: Secondary | ICD-10-CM

## 2013-02-08 DIAGNOSIS — R5381 Other malaise: Secondary | ICD-10-CM | POA: Insufficient documentation

## 2013-02-08 DIAGNOSIS — R0600 Dyspnea, unspecified: Secondary | ICD-10-CM

## 2013-02-08 DIAGNOSIS — R5383 Other fatigue: Secondary | ICD-10-CM | POA: Insufficient documentation

## 2013-02-08 DIAGNOSIS — Z01818 Encounter for other preprocedural examination: Secondary | ICD-10-CM

## 2013-02-08 DIAGNOSIS — Z8249 Family history of ischemic heart disease and other diseases of the circulatory system: Secondary | ICD-10-CM | POA: Insufficient documentation

## 2013-02-08 DIAGNOSIS — Z8673 Personal history of transient ischemic attack (TIA), and cerebral infarction without residual deficits: Secondary | ICD-10-CM | POA: Insufficient documentation

## 2013-02-08 DIAGNOSIS — I10 Essential (primary) hypertension: Secondary | ICD-10-CM | POA: Insufficient documentation

## 2013-02-08 DIAGNOSIS — I6529 Occlusion and stenosis of unspecified carotid artery: Secondary | ICD-10-CM | POA: Insufficient documentation

## 2013-02-08 DIAGNOSIS — Z87891 Personal history of nicotine dependence: Secondary | ICD-10-CM | POA: Insufficient documentation

## 2013-02-08 DIAGNOSIS — Z01810 Encounter for preprocedural cardiovascular examination: Secondary | ICD-10-CM

## 2013-02-08 MED ORDER — REGADENOSON 0.4 MG/5ML IV SOLN
0.4000 mg | Freq: Once | INTRAVENOUS | Status: AC
  Administered 2013-02-08: 0.4 mg via INTRAVENOUS

## 2013-02-08 MED ORDER — TECHNETIUM TC 99M SESTAMIBI GENERIC - CARDIOLITE
33.0000 | Freq: Once | INTRAVENOUS | Status: AC | PRN
  Administered 2013-02-08: 33 via INTRAVENOUS

## 2013-02-08 MED ORDER — TECHNETIUM TC 99M SESTAMIBI GENERIC - CARDIOLITE
11.0000 | Freq: Once | INTRAVENOUS | Status: AC | PRN
  Administered 2013-02-08: 11 via INTRAVENOUS

## 2013-02-08 NOTE — Progress Notes (Signed)
[facility] SITE 3 NUCLEAR MED [address] [place], [place] [address] [phone]    Cardiology Nuclear Med Study  Tanner [name] is a 78 y.o. male     MRN : [id number]     DOB: 02-28-1935  Procedure Date: 02/08/2013  Nuclear Med Background Indication for Stress Test:  Evaluation for Ischemia and Pending Clearance for (L) CEA  by Dr. Tawanna Cooler Early on  02/13/13 History:  '11 Echo:EF=60%, mild MR Cardiac Risk Factors: Carotid Disease, Family History - CAD, History of Smoking, Hypertension, Lipids and TIA  Symptoms:  Fatigue   Nuclear Pre-Procedure Caffeine/Decaff Intake:  None NPO After: 6pm   Lungs:  Clear. O2 Sat: 99% on room air. IV 0.9% NS with Angio Cath:  22g  IV Site: R Hand  IV Started by:  Bonnita Levan, RN  Chest Size (in):  44 Cup Size: n/a  Height: 6\' 2"  (1.88 m)  Weight:  222 lb (100.699 kg)  BMI:  Body mass index is 28.49 kg/(m^2). Tech Comments:  N/A    Nuclear Med Study 1 or 2 day study: 1 day  Stress Test Type:  Lexiscan  Reading MD: Willa Rough, MD  Order Authorizing Provider:  Charlton Haws, MD  Resting Radionuclide: Technetium 1m Sestamibi  Resting Radionuclide Dose: 11.0 mCi   Stress Radionuclide:  Technetium 11m Sestamibi  Stress Radionuclide Dose: 33.0 mCi           Stress Protocol Rest HR: 51 Stress HR: 69  Rest BP: 130/70 Stress BP: 132/69  Exercise Time (min): n/a METS: n/a   Predicted Max HR: 144 bpm % Max HR: 47.92 bpm Rate Pressure Product: 9108   Dose of Adenosine (mg):  n/a Dose of Lexiscan: 0.4 mg  Dose of Atropine (mg): n/a Dose of Dobutamine: n/a mcg/kg/min (at max HR)  Stress Test Technologist: Smiley Houseman, CMA-N  Nuclear Technologist:  Domenic Polite, CNMT     Rest Procedure:  Myocardial perfusion imaging was performed at rest 45 minutes following the intravenous administration of Technetium 67m Sestamibi.  Rest ECG: NSR - Normal EKG  Stress Procedure:  The patient received IV Lexiscan 0.4 mg over 15-seconds.   Technetium 61m Sestamibi injected at 30-seconds.  Quantitative spect images were obtained after a 45 minute delay.  Stress ECG: No significant change from baseline ECG  QPS Raw Data Images:  Normal; no motion artifact; normal heart/lung ratio. Stress Images:  Normal homogeneous uptake in all areas of the myocardium. Rest Images:  Normal homogeneous uptake in all areas of the myocardium. Subtraction (SDS):  No evidence of ischemia. Transient Ischemic Dilatation (Normal <1.22):  n/a Lung/Heart Ratio (Normal <0.45):  0.38  Quantitative Gated Spect Images QGS EDV:  133 ml QGS ESV:  51 ml  Impression Exercise Capacity:  Lexiscan with no exercise. BP Response:  Normal blood pressure response. Clinical Symptoms:  felt weird ECG Impression:  No significant ST segment change suggestive of ischemia. Comparison with Prior Nuclear Study: No previous nuclear study performed  Overall Impression:  Normal stress nuclear study. Low risk  LV Ejection Fraction: 61%.  LV Wall Motion:  Normal Wall Motion.  Willa Rough, MD

## 2013-02-11 ENCOUNTER — Encounter: Payer: Self-pay | Admitting: Family Medicine

## 2013-02-12 MED ORDER — CEFUROXIME SODIUM 1.5 G IJ SOLR
1.5000 g | INTRAMUSCULAR | Status: AC
  Administered 2013-02-13: 1.5 g via INTRAVENOUS
  Filled 2013-02-12: qty 1.5

## 2013-02-13 ENCOUNTER — Encounter (HOSPITAL_COMMUNITY): Payer: Self-pay | Admitting: Anesthesiology

## 2013-02-13 ENCOUNTER — Encounter (HOSPITAL_COMMUNITY): Payer: Self-pay | Admitting: *Deleted

## 2013-02-13 ENCOUNTER — Encounter (HOSPITAL_COMMUNITY)
Admission: RE | Disposition: A | Discharge status: Home / Self Care | Payer: Self-pay | Source: Ambulatory Visit | Attending: Vascular Surgery

## 2013-02-13 ENCOUNTER — Inpatient Hospital Stay (HOSPITAL_COMMUNITY): Payer: Medicare Other | Admitting: Anesthesiology

## 2013-02-13 ENCOUNTER — Inpatient Hospital Stay (HOSPITAL_COMMUNITY)
Admission: RE | Admit: 2013-02-13 | Discharge: 2013-02-14 | DRG: 039 | Disposition: A | Discharge status: Home / Self Care | Payer: Medicare Other | Source: Ambulatory Visit | Attending: Vascular Surgery | Admitting: Vascular Surgery

## 2013-02-13 DIAGNOSIS — E039 Hypothyroidism, unspecified: Secondary | ICD-10-CM | POA: Diagnosis present

## 2013-02-13 DIAGNOSIS — I658 Occlusion and stenosis of other precerebral arteries: Secondary | ICD-10-CM | POA: Diagnosis present

## 2013-02-13 DIAGNOSIS — Z85828 Personal history of other malignant neoplasm of skin: Secondary | ICD-10-CM

## 2013-02-13 DIAGNOSIS — Z7902 Long term (current) use of antithrombotics/antiplatelets: Secondary | ICD-10-CM

## 2013-02-13 DIAGNOSIS — Z8673 Personal history of transient ischemic attack (TIA), and cerebral infarction without residual deficits: Secondary | ICD-10-CM

## 2013-02-13 DIAGNOSIS — I6529 Occlusion and stenosis of unspecified carotid artery: Principal | ICD-10-CM | POA: Diagnosis present

## 2013-02-13 DIAGNOSIS — Z87891 Personal history of nicotine dependence: Secondary | ICD-10-CM

## 2013-02-13 DIAGNOSIS — I63239 Cerebral infarction due to unspecified occlusion or stenosis of unspecified carotid arteries: Secondary | ICD-10-CM

## 2013-02-13 DIAGNOSIS — Z79899 Other long term (current) drug therapy: Secondary | ICD-10-CM

## 2013-02-13 DIAGNOSIS — I10 Essential (primary) hypertension: Secondary | ICD-10-CM | POA: Diagnosis present

## 2013-02-13 DIAGNOSIS — G4730 Sleep apnea, unspecified: Secondary | ICD-10-CM | POA: Diagnosis present

## 2013-02-13 HISTORY — PX: CAROTID ENDARTERECTOMY: SUR193

## 2013-02-13 HISTORY — PX: ENDARTERECTOMY: SHX5162

## 2013-02-13 SURGERY — ENDARTERECTOMY, CAROTID
Anesthesia: General | Site: Neck | Laterality: Left | Wound class: Clean

## 2013-02-13 MED ORDER — MORPHINE SULFATE 2 MG/ML IJ SOLN
2.0000 mg | INTRAMUSCULAR | Status: DC | PRN
  Administered 2013-02-13 (×3): 2 mg via INTRAVENOUS
  Filled 2013-02-13 (×2): qty 1

## 2013-02-13 MED ORDER — DOCUSATE SODIUM 100 MG PO CAPS: 100.0000 mg | ORAL_CAPSULE | Freq: Every day | ORAL | Status: DC

## 2013-02-13 MED ORDER — CLOPIDOGREL BISULFATE 75 MG PO TABS
75.0000 mg | ORAL_TABLET | Freq: Every day | ORAL | Status: DC
  Administered 2013-02-13 – 2013-02-14 (×2): 75 mg via ORAL
  Filled 2013-02-13 (×3): qty 1

## 2013-02-13 MED ORDER — PROPOFOL 10 MG/ML IV BOLUS
INTRAVENOUS | Status: DC | PRN
  Administered 2013-02-13: 150 mg via INTRAVENOUS

## 2013-02-13 MED ORDER — SODIUM CHLORIDE 0.9 % IR SOLN
Status: DC | PRN
  Administered 2013-02-13: 08:00:00

## 2013-02-13 MED ORDER — DEXTROSE 5 % IV SOLN
1.5000 g | Freq: Two times a day (BID) | INTRAVENOUS | Status: AC
  Administered 2013-02-13 – 2013-02-14 (×2): 1.5 g via INTRAVENOUS
  Filled 2013-02-13 (×2): qty 1.5

## 2013-02-13 MED ORDER — HYDRALAZINE HCL 20 MG/ML IJ SOLN: 10.0000 mg | INTRAMUSCULAR | Status: DC | PRN

## 2013-02-13 MED ORDER — CHLORTHALIDONE 25 MG PO TABS
25.0000 mg | ORAL_TABLET | Freq: Every day | ORAL | Status: DC
  Filled 2013-02-13 (×2): qty 1

## 2013-02-13 MED ORDER — MIDAZOLAM HCL 5 MG/5ML IJ SOLN
INTRAMUSCULAR | Status: DC | PRN
  Administered 2013-02-13: 1 mg via INTRAVENOUS

## 2013-02-13 MED ORDER — ALUM & MAG HYDROXIDE-SIMETH 200-200-20 MG/5ML PO SUSP: 15.0000 mL | ORAL | Status: DC | PRN

## 2013-02-13 MED ORDER — MORPHINE SULFATE 2 MG/ML IJ SOLN
INTRAMUSCULAR | Status: AC
  Filled 2013-02-13: qty 1

## 2013-02-13 MED ORDER — RAMIPRIL 10 MG PO CAPS
10.0000 mg | ORAL_CAPSULE | Freq: Two times a day (BID) | ORAL | Status: DC
  Administered 2013-02-13: 10 mg via ORAL
  Filled 2013-02-13 (×4): qty 1

## 2013-02-13 MED ORDER — DOPAMINE-DEXTROSE 3.2-5 MG/ML-% IV SOLN: 3.0000 ug/kg/min | INTRAVENOUS | Status: DC

## 2013-02-13 MED ORDER — PANTOPRAZOLE SODIUM 40 MG PO TBEC
40.0000 mg | DELAYED_RELEASE_TABLET | Freq: Every day | ORAL | Status: DC
  Administered 2013-02-13: 40 mg via ORAL
  Filled 2013-02-13: qty 1

## 2013-02-13 MED ORDER — GUAIFENESIN-DM 100-10 MG/5ML PO SYRP: 15.0000 mL | ORAL_SOLUTION | ORAL | Status: DC | PRN

## 2013-02-13 MED ORDER — MAGNESIUM SULFATE 40 MG/ML IJ SOLN
2.0000 g | Freq: Once | INTRAMUSCULAR | Status: AC | PRN
  Filled 2013-02-13: qty 50

## 2013-02-13 MED ORDER — ACETAMINOPHEN 650 MG RE SUPP: 325.0000 mg | RECTAL | Status: DC | PRN

## 2013-02-13 MED ORDER — 0.9 % SODIUM CHLORIDE (POUR BTL) OPTIME
TOPICAL | Status: DC | PRN
  Administered 2013-02-13: 2000 mL

## 2013-02-13 MED ORDER — SODIUM CHLORIDE 0.9 % IV SOLN: 500.0000 mL | Freq: Once | INTRAVENOUS | Status: AC | PRN

## 2013-02-13 MED ORDER — ATORVASTATIN CALCIUM 80 MG PO TABS
80.0000 mg | ORAL_TABLET | Freq: Every day | ORAL | Status: DC
  Administered 2013-02-13: 80 mg via ORAL
  Filled 2013-02-13 (×2): qty 1

## 2013-02-13 MED ORDER — PROTAMINE SULFATE 10 MG/ML IV SOLN
INTRAVENOUS | Status: DC | PRN
  Administered 2013-02-13: 10 mg via INTRAVENOUS
  Administered 2013-02-13: 20 mg via INTRAVENOUS
  Administered 2013-02-13 (×3): 10 mg via INTRAVENOUS
  Administered 2013-02-13 (×2): 20 mg via INTRAVENOUS

## 2013-02-13 MED ORDER — LABETALOL HCL 5 MG/ML IV SOLN: 10.0000 mg | INTRAVENOUS | Status: DC | PRN

## 2013-02-13 MED ORDER — VECURONIUM BROMIDE 10 MG IV SOLR
INTRAVENOUS | Status: DC | PRN
  Administered 2013-02-13 (×2): 1 mg via INTRAVENOUS

## 2013-02-13 MED ORDER — SODIUM CHLORIDE 0.9 % IV SOLN
INTRAVENOUS | Status: DC
  Administered 2013-02-13: 15:00:00 via INTRAVENOUS

## 2013-02-13 MED ORDER — ASPIRIN EC 81 MG PO TBEC
81.0000 mg | DELAYED_RELEASE_TABLET | Freq: Every day | ORAL | Status: DC
  Administered 2013-02-13: 81 mg via ORAL
  Filled 2013-02-13 (×2): qty 1

## 2013-02-13 MED ORDER — ONDANSETRON HCL 4 MG/2ML IJ SOLN
INTRAMUSCULAR | Status: DC | PRN
  Administered 2013-02-13: 4 mg via INTRAVENOUS

## 2013-02-13 MED ORDER — LACTATED RINGERS IV SOLN
INTRAVENOUS | Status: DC | PRN
  Administered 2013-02-13 (×2): via INTRAVENOUS

## 2013-02-13 MED ORDER — NEOSTIGMINE METHYLSULFATE 1 MG/ML IJ SOLN
INTRAMUSCULAR | Status: DC | PRN
  Administered 2013-02-13: 4 mg via INTRAVENOUS

## 2013-02-13 MED ORDER — ARTIFICIAL TEARS OP OINT
TOPICAL_OINTMENT | OPHTHALMIC | Status: DC | PRN
  Administered 2013-02-13: 1 via OPHTHALMIC

## 2013-02-13 MED ORDER — LIDOCAINE HCL (PF) 1 % IJ SOLN
INTRAMUSCULAR | Status: AC
  Filled 2013-02-13: qty 30

## 2013-02-13 MED ORDER — LIDOCAINE HCL 4 % MT SOLN
OROMUCOSAL | Status: DC | PRN
  Administered 2013-02-13: 4 mL via TOPICAL

## 2013-02-13 MED ORDER — BISACODYL 10 MG RE SUPP: 10.0000 mg | Freq: Every day | RECTAL | Status: DC | PRN

## 2013-02-13 MED ORDER — PHENOL 1.4 % MT LIQD
1.0000 | OROMUCOSAL | Status: DC | PRN
  Administered 2013-02-13: 1 via OROMUCOSAL
  Filled 2013-02-13: qty 177

## 2013-02-13 MED ORDER — ONDANSETRON HCL 4 MG/2ML IJ SOLN: 4.0000 mg | Freq: Four times a day (QID) | INTRAMUSCULAR | Status: DC | PRN

## 2013-02-13 MED ORDER — SODIUM CHLORIDE 0.9 % IV SOLN: INTRAVENOUS | Status: DC

## 2013-02-13 MED ORDER — EPHEDRINE SULFATE 50 MG/ML IJ SOLN
INTRAMUSCULAR | Status: DC | PRN
  Administered 2013-02-13 (×2): 2.5 mg via INTRAVENOUS
  Administered 2013-02-13 (×3): 5 mg via INTRAVENOUS
  Administered 2013-02-13: 2.5 mg via INTRAVENOUS

## 2013-02-13 MED ORDER — PHENYLEPHRINE HCL 10 MG/ML IJ SOLN
10.0000 mg | INTRAVENOUS | Status: DC | PRN
  Administered 2013-02-13: 20 ug/min via INTRAVENOUS

## 2013-02-13 MED ORDER — POTASSIUM CHLORIDE CRYS ER 20 MEQ PO TBCR: 20.0000 meq | EXTENDED_RELEASE_TABLET | Freq: Once | ORAL | Status: AC | PRN

## 2013-02-13 MED ORDER — SENNOSIDES-DOCUSATE SODIUM 8.6-50 MG PO TABS
1.0000 | ORAL_TABLET | Freq: Every evening | ORAL | Status: DC | PRN
  Filled 2013-02-13: qty 1

## 2013-02-13 MED ORDER — LEVOTHYROXINE SODIUM 50 MCG PO TABS
50.0000 ug | ORAL_TABLET | Freq: Every day | ORAL | Status: DC
  Administered 2013-02-14: 50 ug via ORAL
  Filled 2013-02-13 (×2): qty 1

## 2013-02-13 MED ORDER — OXYCODONE-ACETAMINOPHEN 5-325 MG PO TABS
1.0000 | ORAL_TABLET | ORAL | Status: DC | PRN
  Administered 2013-02-13: 1 via ORAL
  Filled 2013-02-13: qty 1

## 2013-02-13 MED ORDER — HEPARIN SODIUM (PORCINE) 1000 UNIT/ML IJ SOLN
INTRAMUSCULAR | Status: DC | PRN
  Administered 2013-02-13: 9000 [IU] via INTRAVENOUS

## 2013-02-13 MED ORDER — ROCURONIUM BROMIDE 100 MG/10ML IV SOLN
INTRAVENOUS | Status: DC | PRN
  Administered 2013-02-13: 10 mg via INTRAVENOUS
  Administered 2013-02-13: 40 mg via INTRAVENOUS

## 2013-02-13 MED ORDER — GLYCOPYRROLATE 0.2 MG/ML IJ SOLN
INTRAMUSCULAR | Status: DC | PRN
  Administered 2013-02-13: 0.6 mg via INTRAVENOUS

## 2013-02-13 MED ORDER — LIDOCAINE HCL (CARDIAC) 20 MG/ML IV SOLN
INTRAVENOUS | Status: DC | PRN
  Administered 2013-02-13: 60 mg via INTRAVENOUS

## 2013-02-13 MED ORDER — FENTANYL CITRATE 0.05 MG/ML IJ SOLN
INTRAMUSCULAR | Status: DC | PRN
  Administered 2013-02-13: 50 ug via INTRAVENOUS
  Administered 2013-02-13: 150 ug via INTRAVENOUS
  Administered 2013-02-13: 50 ug via INTRAVENOUS

## 2013-02-13 MED ORDER — FENTANYL CITRATE 0.05 MG/ML IJ SOLN
INTRAMUSCULAR | Status: AC
  Filled 2013-02-13: qty 2

## 2013-02-13 MED ORDER — METOPROLOL TARTRATE 1 MG/ML IV SOLN: 2.0000 mg | INTRAVENOUS | Status: DC | PRN

## 2013-02-13 MED ORDER — ACETAMINOPHEN 325 MG PO TABS: 325.0000 mg | ORAL_TABLET | ORAL | Status: DC | PRN

## 2013-02-13 MED ORDER — FENTANYL CITRATE 0.05 MG/ML IJ SOLN
25.0000 ug | INTRAMUSCULAR | Status: DC | PRN
  Administered 2013-02-13: 25 ug via INTRAVENOUS

## 2013-02-13 SURGICAL SUPPLY — 49 items
APL SKNCLS STERI-STRIP NONHPOA (GAUZE/BANDAGES/DRESSINGS) ×1
BENZOIN TINCTURE PRP APPL 2/3 (GAUZE/BANDAGES/DRESSINGS) ×2 IMPLANT
BLADE SURG ROTATE 9660 (MISCELLANEOUS) ×1 IMPLANT
CANISTER SUCTION 2500CC (MISCELLANEOUS) ×2 IMPLANT
CANNULA VESSEL W/WING WO/VALVE (CANNULA) ×1 IMPLANT
CATH ROBINSON RED A/P 18FR (CATHETERS) ×2 IMPLANT
CLIP LIGATING EXTRA MED SLVR (CLIP) ×2 IMPLANT
CLIP LIGATING EXTRA SM BLUE (MISCELLANEOUS) ×2 IMPLANT
CLOTH BEACON ORANGE TIMEOUT ST (SAFETY) ×2 IMPLANT
COVER SURGICAL LIGHT HANDLE (MISCELLANEOUS) ×2 IMPLANT
CRADLE DONUT ADULT HEAD (MISCELLANEOUS) ×2 IMPLANT
DECANTER SPIKE VIAL GLASS SM (MISCELLANEOUS) IMPLANT
DRAIN HEMOVAC 1/8 X 5 (WOUND CARE) IMPLANT
DRAPE WARM FLUID 44X44 (DRAPE) ×2 IMPLANT
ELECT REM PT RETURN 9FT ADLT (ELECTROSURGICAL) ×2
ELECTRODE REM PT RTRN 9FT ADLT (ELECTROSURGICAL) ×1 IMPLANT
EVACUATOR SILICONE 100CC (DRAIN) IMPLANT
GEL ULTRASOUND 20GR AQUASONIC (MISCELLANEOUS) IMPLANT
GLOVE BIO SURGEON STRL SZ7.5 (GLOVE) ×1 IMPLANT
GLOVE BIOGEL PI IND STRL 6.5 (GLOVE) IMPLANT
GLOVE BIOGEL PI IND STRL 7.5 (GLOVE) IMPLANT
GLOVE BIOGEL PI INDICATOR 6.5 (GLOVE) ×1
GLOVE BIOGEL PI INDICATOR 7.5 (GLOVE) ×2
GLOVE ECLIPSE 7.5 STRL STRAW (GLOVE) ×1 IMPLANT
GLOVE SS BIOGEL STRL SZ 7.5 (GLOVE) ×1 IMPLANT
GLOVE SUPERSENSE BIOGEL SZ 7.5 (GLOVE) ×2
GOWN STRL NON-REIN LRG LVL3 (GOWN DISPOSABLE) ×6 IMPLANT
GOWN STRL REIN XL XLG (GOWN DISPOSABLE) ×1 IMPLANT
KIT BASIN OR (CUSTOM PROCEDURE TRAY) ×2 IMPLANT
KIT ROOM TURNOVER OR (KITS) ×2 IMPLANT
NEEDLE 22X1 1/2 (OR ONLY) (NEEDLE) IMPLANT
NS IRRIG 1000ML POUR BTL (IV SOLUTION) ×4 IMPLANT
PACK CAROTID (CUSTOM PROCEDURE TRAY) ×2 IMPLANT
PAD ARMBOARD 7.5X6 YLW CONV (MISCELLANEOUS) ×4 IMPLANT
PATCH HEMASHIELD 8X75 (Vascular Products) ×1 IMPLANT
SHUNT CAROTID BYPASS 10 (VASCULAR PRODUCTS) ×1 IMPLANT
SHUNT CAROTID BYPASS 12FRX15.5 (VASCULAR PRODUCTS) IMPLANT
STRIP CLOSURE SKIN 1/2X4 (GAUZE/BANDAGES/DRESSINGS) ×2 IMPLANT
SUT ETHILON 3 0 PS 1 (SUTURE) IMPLANT
SUT PROLENE 6 0 CC (SUTURE) ×4 IMPLANT
SUT SILK 3 0 (SUTURE)
SUT SILK 3-0 18XBRD TIE 12 (SUTURE) IMPLANT
SUT VIC AB 3-0 SH 27 (SUTURE) ×4
SUT VIC AB 3-0 SH 27X BRD (SUTURE) ×2 IMPLANT
SUT VICRYL 4-0 PS2 18IN ABS (SUTURE) ×2 IMPLANT
SYR CONTROL 10ML LL (SYRINGE) IMPLANT
TOWEL OR 17X24 6PK STRL BLUE (TOWEL DISPOSABLE) ×2 IMPLANT
TOWEL OR 17X26 10 PK STRL BLUE (TOWEL DISPOSABLE) ×2 IMPLANT
WATER STERILE IRR 1000ML POUR (IV SOLUTION) ×2 IMPLANT

## 2013-02-13 NOTE — Transfer of Care (Signed)
Immediate Anesthesia Transfer of Care Note  Patient: Tanner [name]  Procedure(s) Performed: Procedure(s): ENDARTERECTOMY CAROTID (Left)  Patient Location: PACU  Anesthesia Type:General  Level of Consciousness: awake, alert  and patient cooperative  Airway & Oxygen Therapy: Patient Spontanous Breathing and Patient connected to nasal cannula oxygen  Post-op Assessment: Report given to PACU RN, Post -op Vital signs reviewed and stable, Patient moving all extremities X 4 and Patient able to stick tongue midline  Post vital signs: Reviewed and stable  Complications: No apparent anesthesia complications

## 2013-02-13 NOTE — Progress Notes (Signed)
Pt arrived from PACU, SB, will continue to monitor.

## 2013-02-13 NOTE — Progress Notes (Signed)
ANTIBIOTIC CONSULT NOTE - INITIAL  Pharmacy Consult:  Renally adjust abx  No Known Allergies  Patient Measurements: Height: 6' 2.02" (188 cm) Weight: 222 lb 0.1 oz (100.7 kg) IBW/kg (Calculated) : 82.24  Vital Signs: Temp: 97.5 F (36.4 C) (07/28 1159) Temp src: Oral (07/28 1159) BP: 114/56 mmHg (07/28 1322) Pulse Rate: 50 (07/28 1144) Intake/Output from this shift: Total I/O In: 1390 [P.O.:240; I.V.:1150] Out: 550 [Urine:500; Blood:50]  Labs: No results found for this basename: WBC, HGB, PLT, LABCREA, CREATININE,  in the last 72 hours Estimated Creatinine Clearance: 68.1 ml/min (by C-G formula based on Cr of 1.17). No results found for this basename: VANCOTROUGH, Leodis Binet, VANCORANDOM, GENTTROUGH, GENTPEAK, GENTRANDOM, TOBRATROUGH, TOBRAPEAK, TOBRARND, AMIKACINPEAK, AMIKACINTROU, AMIKACIN,  in the last 72 hours   Microbiology: Recent Results (from the past 720 hour(s))  SURGICAL PCR SCREEN     Status: None   Collection Time    02/07/13  9:17 AM      Result Value Range Status   MRSA, PCR NEGATIVE  NEGATIVE Final   Staphylococcus aureus NEGATIVE  NEGATIVE Final   Comment:            The Xpert SA Assay (FDA     approved for NASAL specimens     in patients over 32 years of age),     is one component of     a comprehensive surveillance     program.  Test performance has     been validated by [employer] for patients greater     than or equal to 12 year old.     It is not intended     to diagnose infection nor to     guide or monitor treatment.    Medical History: Past Medical History  Diagnosis Date  . Allergy     seasonal- mostly spring  . TOBACCO ABUSE, HX OF 03/17/2010  . Overweight(278.02) 07/01/2010  . Personal history of other infectious and parasitic disease 03/17/2010  . Mixed hyperlipidemia 03/17/2010  . HYPERKALEMIA 05/27/2010  . FATIGUE 03/17/2010  . CHICKENPOX, HX OF 03/17/2010  . CAROTID ARTERY OCCLUSION, WITH INFARCTION 03/17/2010  . ASTHMA,  CHILDHOOD 03/17/2010  . Hearing loss 11/04/2010    bilateral  . Anemia 02/03/2011  . Hypertension   . Tubular adenoma of colon 06/10/2011  . Hypothyroid 12/08/2011  . BCC (basal cell carcinoma) 12/08/2011  . Thrombocytopenia 04/05/2012  . Sleep apnea   . CEREBROVASCULAR ACCIDENT 03/17/2010    partial loss of peripheral vision on left       Assessment: 78 YOM s/p left carotid endarterectomy to continue Zinacef for post-op prophylaxis.  Renal function appropriate for ordered Zinacef dose.   Goal of Therapy:  Infection prevention / clearance of infection   Plan:  - Continue Zinacef 1.5gm IV Q12H x 2 doses as ordered - Pharmacy will sign off as dosage adjustment likely unnecessary.  Thank you for the consult!     Dyron Kawano D. Laney Potash, PharmD, BCPS Pager:  [phone] 02/13/2013, 2:14 PM

## 2013-02-13 NOTE — Interval H&P Note (Signed)
History and Physical Interval Note:  02/13/2013 7:16 AM  Tanner [name]  has presented today for surgery, with the diagnosis of LEFT ICA STENOSIS  The various methods of treatment have been discussed with the patient and family. After consideration of risks, benefits and other options for treatment, the patient has consented to  Procedure(s): ENDARTERECTOMY CAROTID (Left) as a surgical intervention .  The patient's history has been reviewed, patient examined, no change in status, stable for surgery.  I have reviewed the patient's chart and labs.  Questions were answered to the patient's satisfaction.     EARLY, TODD

## 2013-02-13 NOTE — Anesthesia Procedure Notes (Signed)
Procedure Name: Intubation Date/Time: 02/13/2013 7:45 AM Performed by: Gayla Medicus Pre-anesthesia Checklist: Patient identified, Emergency Drugs available, Suction available and Patient being monitored Patient Re-evaluated:Patient Re-evaluated prior to inductionOxygen Delivery Method: Circle system utilized Preoxygenation: Pre-oxygenation with 100% oxygen Intubation Type: IV induction Ventilation: Mask ventilation without difficulty and Oral airway inserted - appropriate to patient size Laryngoscope Size: Hyacinth Meeker and 2 Grade View: Grade II Tube type: Oral Tube size: 7.5 mm Number of attempts: 1 Airway Equipment and Method: Stylet and LTA kit utilized Placement Confirmation: ETT inserted through vocal cords under direct vision,  positive ETCO2,  CO2 detector and breath sounds checked- equal and bilateral Secured at: 24 cm Tube secured with: Tape Dental Injury: Teeth and Oropharynx as per pre-operative assessment

## 2013-02-13 NOTE — Anesthesia Preprocedure Evaluation (Addendum)
Anesthesia Evaluation  Patient identified by MRN, date of birth, ID band Patient awake    Reviewed: Allergy & Precautions, H&P , NPO status , Patient's Chart, lab work & pertinent test results  History of Anesthesia Complications Negative for: history of anesthetic complications  Airway Mallampati: II TM Distance: >3 FB Neck ROM: Full    Dental  (+) Teeth Intact, Poor Dentition and Dental Advisory Given   Pulmonary shortness of breath, asthma , sleep apnea ,  breath sounds clear to auscultation        Cardiovascular hypertension, Pt. on medications + Peripheral Vascular Disease Rhythm:Regular Rate:Normal     Neuro/Psych CVA, No Residual Symptoms    GI/Hepatic negative GI ROS, Neg liver ROS,   Endo/Other  Hypothyroidism   Renal/GU negative Renal ROS     Musculoskeletal   Abdominal   Peds  Hematology   Anesthesia Other Findings   Reproductive/Obstetrics                          Anesthesia Physical Anesthesia Plan  ASA: III  Anesthesia Plan: General   Post-op Pain Management:    Induction: Intravenous  Airway Management Planned: Oral ETT  Additional Equipment: Arterial line  Intra-op Plan:   Post-operative Plan: Extubation in OR  Informed Consent: I have reviewed the patients History and Physical, chart, labs and discussed the procedure including the risks, benefits and alternatives for the proposed anesthesia with the patient or authorized representative who has indicated his/her understanding and acceptance.   Dental advisory given  Plan Discussed with: Anesthesiologist, Surgeon and CRNA  Anesthesia Plan Comments:       Anesthesia Quick Evaluation

## 2013-02-13 NOTE — Preoperative (Signed)
Beta Blockers   Reason not to administer Beta Blockers:Not Applicable 

## 2013-02-13 NOTE — H&P (View-Only) (Signed)
Vascular and Vein Specialist of [facility]   Patient name: Tanner [name] MRN: [id number] DOB: [birth year] Sex: male   Referred by: Eden Emms  Reason for referral:  Chief Complaint  Patient presents with  . New Evaluation    Bilateral carotid stenosis  referred by dr Eden Emms    HISTORY OF PRESENT ILLNESS: The patient presents today for evaluation of extracranial cerebrovascular occlusive disease. He has a history in 2011 of visual disturbances. He was evaluated by his physician who saw no specific ocular problems. He underwent further workup to include hospitalization with MRI and CT. This did reveal complete occlusion of his right common carotid artery extending into his internal carotid artery. At that time he was found to have a moderate left carotid stenosis. He is left-handed. Fortunately he did not have any left body symptoms and all of his symptoms were visual disturbances. He recently had followup ultrasounds showing some progression on his left carotid and then underwent CT angiogram for further evaluation. I am seeing him today for further discussion of this. He specifically denies any left brain symptoms. He has had no right sided weakness. He does not have any specific cardiac issues. He is scheduled for a stress test on 02/08/2013.  Past Medical History  Diagnosis Date  . Allergy     seasonal- mostly spring  . TOBACCO ABUSE, HX OF 03/17/2010  . Overweight(278.02) 07/01/2010  . Personal history of other infectious and parasitic disease 03/17/2010  . Mixed hyperlipidemia 03/17/2010  . HYPERKALEMIA 05/27/2010  . FATIGUE 03/17/2010  . CHICKENPOX, HX OF 03/17/2010  . CEREBROVASCULAR ACCIDENT 03/17/2010  . CAROTID ARTERY OCCLUSION, WITH INFARCTION 03/17/2010  . ASTHMA, CHILDHOOD 03/17/2010  . Hearing loss 11/04/2010    bilateral  . Anemia 02/03/2011  . Hypertension   . Tubular adenoma of colon 06/10/2011  . Hypothyroid 12/08/2011  . BCC (basal cell carcinoma) 12/08/2011  .  Thrombocytopenia 04/05/2012  . Sleep apnea     Past Surgical History  Procedure Laterality Date  . Tonsillectomy    . Pilonidal cyst excision  1968    removed  . Knee cartliage repair  1964, 1990    bilateral  . Rotator cuff repair      right    History   Social History  . Marital Status: Divorced    Spouse Name: N/A    Number of Children: 3  . Years of Education: 48yrs   Occupational History  . retired    Social History Main Topics  . Smoking status: Former Smoker -- 2.00 packs/day for 20 years    Types: Cigarettes    Quit date: 07/19/1977  . Smokeless tobacco: Never Used  . Alcohol Use: 4.2 oz/week    7 Glasses of wine per week     Comment: occasionally  . Drug Use: No  . Sexually Active: No   Other Topics Concern  . Not on file   Social History Narrative   Pt lives at home alone.    Caffeine Use: very little    Family History  Problem Relation Age of Onset  . Alzheimer's disease Mother   . Emphysema Sister     cigarettes  . Heart disease Son   . Coronary artery disease Brother   . Heart attack Brother   . Other Brother     heart problems- from fathers side  . Cancer Sister     gyn  . Bipolar disorder Daughter   . Obesity Daughter   . Heart disease Sister  Allergies as of 01/24/2013  . (No Known Allergies)    Current Outpatient Prescriptions on File Prior to Visit  Medication Sig Dispense Refill  . b complex vitamins tablet Take 1 tablet by mouth daily.      . chlorthalidone (HYGROTON) 25 MG tablet Take 1 tablet (25 mg total) by mouth daily.  90 tablet  3  . Cholecalciferol (VITAMIN D PO) Take 1 tablet by mouth daily.      . clopidogrel (PLAVIX) 75 MG tablet Take 1 tablet (75 mg total) by mouth daily. With a meal  Generic please  90 tablet  3  . Coenzyme Q10 (CO Q-10) 300 MG CAPS Take 1 capsule by mouth daily.      [name] levothyroxine (SYNTHROID, LEVOTHROID) 50 MCG tablet Take 1 tablet (50 mcg total) by mouth daily before breakfast.  90  tablet  0  . NON FORMULARY Study Drug 1 tab po qd      . OVER THE COUNTER MEDICATION Mega red 500 mg once daily      . Probiotic Product (PROBIOTIC PO) Take 1 tablet by mouth daily.      . ramipril (ALTACE) 10 MG capsule Take 1 capsule (10 mg total) by mouth 2 (two) times daily.  180 capsule  3  . rosuvastatin (CRESTOR) 40 MG tablet Take 1 tablet (40 mg total) by mouth daily.  90 tablet  3   No current facility-administered medications on file prior to visit.     REVIEW OF SYSTEMS:  Positives indicated with an "X"  CARDIOVASCULAR:  [ ]  chest pain   [ ]  chest pressure   [ ]  palpitations   [ ]  orthopnea   [ ]  dyspnea on exertion   [ ]  claudication   [ ]  rest pain   [ ]  DVT   [ ]  phlebitis PULMONARY:   [ ]  productive cough   [ ]  asthma   [ ]  wheezing NEUROLOGIC:   [ ]  weakness  [ ]  paresthesias  [ ]  aphasia  [ ]  amaurosis  [ ]  dizziness HEMATOLOGIC:   [ ]  bleeding problems   [ ]  clotting disorders MUSCULOSKELETAL:  [ ]  joint pain   [ ]  joint swelling GASTROINTESTINAL: [ ]   blood in stool  [ ]   hematemesis GENITOURINARY:  [ ]   dysuria  [ ]   hematuria PSYCHIATRIC:  [ ]  history of major depression INTEGUMENTARY:  [ ]  rashes  [ ]  ulcers CONSTITUTIONAL:  [ ]  fever   [ ]  chills  PHYSICAL EXAMINATION:  General: The patient is a well-nourished male, in no acute distress. Vital signs are BP 150/83  Pulse 60  Resp 16  Ht 6\' 2"  (1.88 m)  Wt 224 lb (101.606 kg)  BMI 28.75 kg/m2 Pulmonary: There is a good air exchange bilaterally without wheezing or rales. Abdomen: Soft and non-tender with normal pitch bowel sounds. No aneurysm palpable Musculoskeletal: There are no major deformities.  There is no significant extremity pain. Neurologic: No focal weakness or paresthesias are detected, Skin: There are no ulcer or rashes noted. Psychiatric: The patient has normal affect. Cardiovascular: There is a regular rate and rhythm without significant murmur appreciated. Pulse status: 2+ radial 2+  femoral and 2+ dorsalis pedis pulses bilaterally Carotid arteries: Left carotid bruit    CT angiogram of his neck from 01/19/2013 was reviewed. I reviewed the actual films and discussed them with the patient as well. This does show occlusion of his right common carotid internal carotid artery. He has a  focal high-grade stenosis of his proximal internal carotid artery with a widely patent internal carotid artery distal to this.  Impression and Plan:  Severe left internal carotid artery stenosis, asymptomatic which is contralateral to a right carotid occlusion. I have recommended endarterectomy for reduction of stroke risk. The procedure including 1-2% risk of stroke with the procedure. I've also explained the remote chance of bleeding infection or cranial nerve injury. He is scheduled for cardiac stress test on 02/08/2013 and we will schedule his surgery following this assuming no cardiac issues. He is tentatively scheduled for left carotid endarterectomy on 02/13/2013    Vaanya Shambaugh [facility] [place] Office: [phone]

## 2013-02-13 NOTE — Progress Notes (Signed)
Utilization review completed.  P.J. Jourdyn Hasler,RN,BSN Case Manager [phone]  

## 2013-02-13 NOTE — Op Note (Signed)
Vascular and Vein Specialists of [place]  Patient name: Tanner [name] MRN: [id number] DOB: February 03, 1935 Sex: male  02/13/2013 Pre-operative Diagnosis: Asymptomatic left carotid stenosis Post-operative diagnosis:  Same Surgeon:  Larina Earthly, M.D. Assistants:  Thomasena Edis Procedure:    left carotid Endarterectomy with Dacron patch angioplasty Anesthesia:  General Blood Loss:  See anesthesia record Specimens:  Carotid Plaque to pathology  Indications for surgery:  Severe asymptomatic stenosis  Procedure in detail:  The patient was taken to the operating and placed in the supine position. The neck was prepped and draped in the usual sterile fashion. An incision was made anterior to the sternocleidomastoid muscle and continued with electrocautery through the platysma muscle. The muscle was retracted posteriorly and the carotid sheath was opened. The facial vein was ligated with 2-0 silk ties and divided. The common carotid artery was encircled with an umbilical tape and Rummel tourniquet. Dissection was continued onto the carotid bifurcation. The superior thyroid artery was controlled with a 2-0 silk Potts tie. The external carotid organ was encircled with a vessel loop and the internal carotid was encircled with umbilical tape and Rummel tourniquet. The hypoglossal and vagus nerves were identified and preserved.  The patient was given systemic heparinization. After adequate circulation time, the internal,external and common carotid arteries were occluded. The common carotid was opened with an 11 blade and the arteriotomy was continued with Potts scissors onto the internal carotid artery. A 10 shunt was passed up the internal carotid artery, allowed to back bleed, and then passed down the common carotid artery. The shunt was secured with Rummel tourniquet. The endarterectomy was begun on the common carotid artery  plaque was divided proximally with Potts scissors. The endarterectomy was continued onto  the carotid bifurcation. The external carotid was endarterectomized by eversion technique and the internal carotid artery was endarterectomized in an open fashion. Remaining debris was removed from the endarterectomy plane. A Dacron patch was brought to the field and sewn as a patch angioplasty. Prior to completing the anastomosis, the shunt was removed and the usual flushing maneuvers were undertaken. The anastomosis was then completed and flow was restored first to the external and then the internal carotid artery. Excellent flow characteristics were noted with hand-held Doppler in the internal and external carotid arteries.  The patient was given protamine to reverse the heparin. Hemostasis was obtained with electrocautery. The wounds were irrigated with saline. The wound was closed by first reapproximating the sternocleidomastoid muscle over the carotid artery with interrupted 3-0 Vicryl sutures. Next, the platysma was closed with a running 3-0 Vicryl suture. The skin was closed with a 4-0 subcuticular Vicryl suture. Benzoin and Steri-Strips were applied to the incision. A sterile dressing was placed over the incision. All sponge and needle counts were correct. The patient was awakened in the operating room, neurologically intact. They were transferred to the PACU in stable condition.  Carotid stenosis at surgery:>80%  Disposition:  To PACU in stable condition,neurologically intact  Relevant Operative Details:  Normal location bifurcation  Larina Earthly, M.D. [facility] of [place] Office: [phone] Pager:  [phone]

## 2013-02-13 NOTE — Anesthesia Postprocedure Evaluation (Signed)
  Anesthesia Post-op Note  Patient: Tanner [name]  Procedure(s) Performed: Procedure(s): ENDARTERECTOMY CAROTID (Left)  Patient Location: PACU  Anesthesia Type:General  Level of Consciousness: awake  Airway and Oxygen Therapy: Patient Spontanous Breathing  Post-op Pain: mild  Post-op Assessment: Post-op Vital signs reviewed  Post-op Vital Signs: Reviewed  Complications: No apparent anesthesia complications

## 2013-02-14 ENCOUNTER — Encounter (HOSPITAL_COMMUNITY): Payer: Self-pay | Admitting: Vascular Surgery

## 2013-02-14 LAB — BASIC METABOLIC PANEL
CO2: 27 mEq/L (ref 19–32)
Calcium: 9 mg/dL (ref 8.4–10.5)
GFR calc non Af Amer: 79 mL/min — ABNORMAL LOW (ref >90)
Potassium: 4 mEq/L (ref 3.5–5.1)
Sodium: 139 mEq/L (ref 135–145)

## 2013-02-14 LAB — CBC
Hemoglobin: 10.6 g/dL — ABNORMAL LOW (ref 13.0–17.0)
MCH: 31.7 pg (ref 26.0–34.0)
Platelets: 95 10*3/uL — ABNORMAL LOW (ref 150–400)
RBC: 3.34 MIL/uL — ABNORMAL LOW (ref 4.22–5.81)
WBC: 5.4 10*3/uL (ref 4.0–10.5)

## 2013-02-14 MED ORDER — OXYCODONE-ACETAMINOPHEN 5-325 MG PO TABS: 1.0000 | ORAL_TABLET | Freq: Four times a day (QID) | ORAL | Status: DC | PRN

## 2013-02-14 NOTE — Progress Notes (Addendum)
VASCULAR AND VEIN SURGERY POST - OP CEA NOTE  HPI:.id had a left Carotid Endarterectomy 1 day ago. Patient is doing well.  Patient reports headache; It has subsided. denies difficulty swallowing; denies weakness; denies symptoms of stroke or TIA  Filed Vitals:   02/13/13 1911 02/13/13 2025 02/13/13 2300 02/14/13 0433  BP:  129/60 126/55 112/49  Pulse: 51 51 55 54  Temp:  97.8 F (36.6 C) 97.9 F (36.6 C) 98.1 F (36.7 C)  TempSrc:  Oral Oral Oral  Resp: 12 13 12 15   Height:      Weight:      SpO2: 100% 100% 97% 100%     Physical Exam: Pt is A&O x 3 Speech is fluent left Neck Wound is clean, dry, intact Negative weakness all extremities Negative tongue deviation Negative facial droop  Plan: Follow-up in 2 weeks with Carotid Duplex scan at 6 months post-op  COLLINS, EMMA MAUREEN PA-C  I have examined the patient, reviewed and agree with above.  Reine Bristow, MD 02/14/2013 8:13 AM

## 2013-02-14 NOTE — Discharge Summary (Signed)
Vascular and Vein Specialists Discharge Summary   Patient ID:  Tanner [name] MRN: [id number] DOB/AGE: 03-13-1935 78 y.o.  Admit date: 02/13/2013 Discharge date: 02/14/2013 Date of Surgery: 02/13/2013 Surgeon: Surgeon(s): Larina Earthly, MD  Admission Diagnosis: LEFT ICA STENOSIS  Discharge Diagnoses:  LEFT ICA STENOSIS  Secondary Diagnoses: Past Medical History  Diagnosis Date  . Allergy     seasonal- mostly spring  . TOBACCO ABUSE, HX OF 03/17/2010  . Overweight(278.02) 07/01/2010  . Personal history of other infectious and parasitic disease 03/17/2010  . Mixed hyperlipidemia 03/17/2010  . HYPERKALEMIA 05/27/2010  . FATIGUE 03/17/2010  . CHICKENPOX, HX OF 03/17/2010  . CAROTID ARTERY OCCLUSION, WITH INFARCTION 03/17/2010  . ASTHMA, CHILDHOOD 03/17/2010  . Hearing loss 11/04/2010    bilateral  . Anemia 02/03/2011  . Hypertension   . Tubular adenoma of colon 06/10/2011  . Hypothyroid 12/08/2011  . BCC (basal cell carcinoma) 12/08/2011  . Thrombocytopenia 04/05/2012  . Sleep apnea   . CEREBROVASCULAR ACCIDENT 03/17/2010    partial loss of peripheral vision on left    Procedure(s): ENDARTERECTOMY CAROTID  Discharged Condition: good  HPI: The patient presents today for evaluation of extracranial cerebrovascular occlusive disease. He has a history in 2011 of visual disturbances. He was evaluated by his physician who saw no specific ocular problems. He underwent further workup to include hospitalization with MRI and CT. This did reveal complete occlusion of his right common carotid artery extending into his internal carotid artery. At that time he was found to have a moderate left carotid stenosis. He is left-handed. Fortunately he did not have any left body symptoms and all of his symptoms were visual disturbances. He recently had followup ultrasounds showing some progression on his left carotid and then underwent CT angiogram for further evaluation. I am seeing him today for further  discussion of this. He specifically denies any left brain symptoms. He has had no right sided weakness. He does not have any specific cardiac issues. He is scheduled for a stress test on 02/08/2013.  He had left carotid Endarterectomy with Dacron patch angioplasty 02/13/2013 by Dr. Arbie Cookey.  He was admitted to 3312.  BP was low and BP meds were held this am.  He ambulated with SB assistants to the bathroom this am.  He reports no signs of TIA or stroke.  Incision dressing was removed and appears clean and dry without signs of infection.    Hospital Course:  Tanner [name] is a 78 y.o. male is S/P Left Procedure(s): ENDARTERECTOMY CAROTID Extubated: POD # 0 Physical exam: Pt is A&O x 3  Speech is fluent  left Neck Wound is clean, dry, intact  Negative weakness all extremities  Negative tongue deviation  Negative facial droop  Post-op wounds clean, dry, intact Pt. Ambulating, voiding and taking PO diet without difficulty. Pt pain controlled with PO pain meds. Labs as below Complications:none  Consults:     Significant Diagnostic Studies: CBC Lab Results  Component Value Date   WBC 5.4 02/14/2013   HGB 10.6* 02/14/2013   HCT 31.1* 02/14/2013   MCV 93.1 02/14/2013   PLT 95* 02/14/2013    BMET    Component Value Date/Time   NA 139 02/14/2013 0530   K 4.0 02/14/2013 0530   CL 105 02/14/2013 0530   CO2 27 02/14/2013 0530   GLUCOSE 102* 02/14/2013 0530   BUN 17 02/14/2013 0530   CREATININE 0.95 02/14/2013 0530   CREATININE 1.20 12/01/2012 1152   CALCIUM 9.0 02/14/2013  0530   GFRNONAA 79* 02/14/2013 0530   GFRAA >90 02/14/2013 0530   COAG Lab Results  Component Value Date   INR 0.99 02/07/2013   INR 0.98 03/10/2010     Disposition:  Discharge to :Home Discharge Orders   Future Appointments Provider Department Dept Phone   03/28/2013 8:00 AM [facility] [place] [phone]   05/09/2013 8:15 AM Bradd Canary, MD [facility] at  [facility]  [phone]   07/03/2013 1:30 PM Ronal Fear, NP [facility] [phone]   Future Orders Complete By Expires     Call MD for:  redness, tenderness, or signs of infection (pain, swelling, bleeding, redness, odor or green/yellow discharge around incision site)  As directed     Call MD for:  severe or increased pain, loss or decreased feeling  in affected limb(s)  As directed     Call MD for:  temperature >100.5  As directed     Driving Restrictions  As directed     Comments:      No driving for 2 weeks    Increase activity slowly  As directed     Comments:      Walk with assistance use walker or cane as needed    Lifting restrictions  As directed     Comments:      No lifting for 6 weeks    May shower   As directed     Resume previous diet  As directed         Medication List         aspirin EC 81 MG tablet  Take 81 mg by mouth daily.     b complex vitamins tablet  Take 1 tablet by mouth daily.     chlorthalidone 25 MG tablet  Commonly known as:  HYGROTON  Take 25 mg by mouth daily.     clopidogrel 75 MG tablet  Commonly known as:  PLAVIX  - Take 75 mg by mouth daily. With a meal  -   - Generic please     Co Q-10 300 MG Caps  Take 1 capsule by mouth daily.     levothyroxine 50 MCG tablet  Commonly known as:  SYNTHROID, LEVOTHROID  Take 50 mcg by mouth daily before breakfast.     NON FORMULARY  Take 1 tablet by mouth daily. Study Drug: Pioglitazone, and a placebo.  Take one tablet by mouth daily.     OVER THE COUNTER MEDICATION  Take 1 capsule by mouth daily. Mega red 500 mg once daily     oxyCODONE-acetaminophen 5-325 MG per tablet  Commonly known as:  PERCOCET/ROXICET  Take 1 tablet by mouth every 6 (six) hours as needed for pain.     PROBIOTIC PO  Take 1 capsule by mouth daily.     ramipril 10 MG capsule  Commonly known as:  ALTACE  Take 10 mg by mouth 2 (two) times daily.     rosuvastatin 40 MG tablet  Commonly known as:  CRESTOR   Take 40 mg by mouth daily.     VITAMIN D PO  Take 2,000 mg by mouth daily.       Verbal and written Discharge instructions given to the patient. Wound care per Discharge AVS   Signed: Clinton Gallant [facility] 02/14/2013, 7:24 AM  --- For VQI Registry use --- Instructions: Press F2 to tab through selections.  Delete question if not applicable.   Modified Rankin  score at D/C (0-6): Rankin Score=0  IV medication needed for:  1. Hypertension: No 2. Hypotension: Yes held BP medications this am.  Post-op Complications: No  1. Post-op CVA or TIA: No  If yes: Event classification (right eye, left eye, right cortical, left cortical, verterobasilar, other):   If yes: Timing of event (intra-op, <6 hrs post-op, >=6 hrs post-op, unknown):   2. CN injury: No  If yes: CN  injuried   3. Myocardial infarction: No  If yes: Dx by (EKG or clinical, Troponin):   4.  CHF: No  5.  Dysrhythmia (new): No  6. Wound infection: No  7. Reperfusion symptoms: No  8. Return to OR: No  If yes: return to OR for (bleeding, neurologic, other CEA incision, other):   Discharge medications: Statin use:  Yes ASA use:  Yes Beta blocker use:  no ACE-Inhibitor use:  Yes P2Y12 Antagonist use: [ ]  None, [x ] Plavix, [ ]  Plasugrel, [ ]  Ticlopinine, [ ]  Ticagrelor, [ ]  Other, [ ]  No for medical reason, [ ]  Non-compliant, [ ]  Not-indicated Anti-coagulant use:  [ ]  None, [ ]  Warfarin, [ ]  Rivaroxaban, [ ]  Dabigatran, [ ]  Other, [ ]  No for medical reason, [ ]  Non-compliant, [ ]  Not-indicated

## 2013-02-14 NOTE — Progress Notes (Signed)
Pt to be d/c home this am. Reviewed d/c instructions. Pt understood.

## 2013-02-15 ENCOUNTER — Telehealth: Payer: Self-pay | Admitting: *Deleted

## 2013-02-15 ENCOUNTER — Telehealth: Payer: Self-pay | Admitting: Vascular Surgery

## 2013-02-15 NOTE — Telephone Encounter (Signed)
I know I talked to Dr Arbie Cookey Monday

## 2013-02-15 NOTE — Telephone Encounter (Addendum)
Message copied by Fredrich Birks on Wed Feb 15, 2013  9:26 AM ------      Message from: Melene Plan      Created: Tue Feb 14, 2013  9:18 AM                   ----- Message -----         From: Lars Mage, PA-C         Sent: 02/14/2013   7:30 AM           To: Melene Plan, RN            Dr. Arbie Cookey left carotid f/u in 2 weeks.  Thanks ------  Spoke with pt- he is aware of appt- he received it on MyChart last night

## 2013-02-15 NOTE — Telephone Encounter (Signed)
FYI WHILE GIVING ECHO RESULTS  PT  WANTED TO  LET YOU KNOW HAD CAROTID SURGERY   WITH DR EARLY ON MON 7.28.14./CY

## 2013-03-02 ENCOUNTER — Other Ambulatory Visit: Payer: Self-pay | Admitting: Family Medicine

## 2013-03-03 MED ORDER — LEVOTHYROXINE SODIUM 50 MCG PO TABS: 50.0000 ug | ORAL_TABLET | Freq: Every day | ORAL | Status: DC

## 2013-03-03 MED ORDER — CHLORTHALIDONE 25 MG PO TABS: 25.0000 mg | ORAL_TABLET | Freq: Every day | ORAL | Status: DC

## 2013-03-03 MED ORDER — CLOPIDOGREL BISULFATE 75 MG PO TABS: 75.0000 mg | ORAL_TABLET | Freq: Every day | ORAL | Status: DC

## 2013-03-03 MED ORDER — RAMIPRIL 10 MG PO CAPS: 10.0000 mg | ORAL_CAPSULE | Freq: Two times a day (BID) | ORAL | Status: DC

## 2013-03-03 MED ORDER — ROSUVASTATIN CALCIUM 40 MG PO TABS: 40.0000 mg | ORAL_TABLET | Freq: Every day | ORAL | Status: DC

## 2013-03-06 ENCOUNTER — Encounter: Payer: Self-pay | Admitting: Vascular Surgery

## 2013-03-07 ENCOUNTER — Ambulatory Visit (INDEPENDENT_AMBULATORY_CARE_PROVIDER_SITE_OTHER): Payer: Medicare Other | Admitting: Vascular Surgery

## 2013-03-07 ENCOUNTER — Telehealth: Payer: Self-pay | Admitting: *Deleted

## 2013-03-07 ENCOUNTER — Encounter: Payer: Self-pay | Admitting: Vascular Surgery

## 2013-03-07 DIAGNOSIS — I6529 Occlusion and stenosis of unspecified carotid artery: Secondary | ICD-10-CM

## 2013-03-07 NOTE — Telephone Encounter (Signed)
Received Fax from [facility] stating that "our current generic for Levothyroxine from [place] is going to be replaced by brand name Synthroid from [facility] provided as our house generic at generic cost, starting 09.06.14; forward form to provider's box for completion of approval or denial for change in patient's medication w/[facility] Ignacia Marvel states that pt will have to obtain from local pharmacy]/SLS

## 2013-03-07 NOTE — Addendum Note (Signed)
Addended by: Adria Dill L on: 03/07/2013 11:31 AM   Modules accepted: Orders

## 2013-03-07 NOTE — Progress Notes (Signed)
The patient has today for followup of carotid endarterectomy for severe asymptomatic carotid disease on 02/13/2013. He does have a known contralateral right internal carotid artery occlusion. He did quite well the surgery and was discharged home on postoperative day 1. He has minimal soreness associated with the surgery. He does have the usual peri-incisional numbness  Physical exam: Well-developed developed well-nourished gentleman neurologically intact Left neck incision is well-healed with typical healing ridge and no erythema  Impression and plan stable followup after left carotid endarterectomy and Dacron patch angioplasty. He will resume full activities with no limitation. I will see him again in 6 months with repeat carotid duplex  Of note, he was completely satisfied with the care he received including his preoperative cardiac evaluation with echocardiogram and nuclear study. He is concerned that he received a automated phone call from [facility] accounting. He called back and was given the impression that he was delinquent: Payment on the cardiac tested happened 3 weeks earlier. He is quite concerned about this. He has 2 he should contact. I have suggested that he call the office the patient experienced to discuss this. We will see him in 6 months

## 2013-03-14 ENCOUNTER — Encounter: Payer: Self-pay | Admitting: Family Medicine

## 2013-03-28 ENCOUNTER — Other Ambulatory Visit (INDEPENDENT_AMBULATORY_CARE_PROVIDER_SITE_OTHER): Payer: Medicare Other

## 2013-03-28 DIAGNOSIS — I10 Essential (primary) hypertension: Secondary | ICD-10-CM

## 2013-03-28 DIAGNOSIS — E039 Hypothyroidism, unspecified: Secondary | ICD-10-CM

## 2013-03-28 DIAGNOSIS — E785 Hyperlipidemia, unspecified: Secondary | ICD-10-CM

## 2013-03-28 LAB — HEPATIC FUNCTION PANEL
ALT: 15 U/L (ref 0–53)
AST: 20 U/L (ref 0–37)
Albumin: 3.9 g/dL (ref 3.5–5.2)
Alkaline Phosphatase: 46 U/L (ref 39–117)
Total Bilirubin: 0.7 mg/dL (ref 0.3–1.2)

## 2013-03-28 LAB — CBC
Hemoglobin: 11.9 g/dL — ABNORMAL LOW (ref 13.0–17.0)
MCHC: 33.4 g/dL (ref 30.0–36.0)
MCV: 94.9 fl (ref 78.0–100.0)
Platelets: 177 10*3/uL (ref 150.0–400.0)

## 2013-03-28 LAB — RENAL FUNCTION PANEL
BUN: 20 mg/dL (ref 6–23)
CO2: 32 mEq/L (ref 19–32)
Calcium: 9.6 mg/dL (ref 8.4–10.5)
Creatinine, Ser: 1 mg/dL (ref 0.4–1.5)
Glucose, Bld: 89 mg/dL (ref 70–99)

## 2013-03-28 LAB — LIPID PANEL
Total CHOL/HDL Ratio: 3
Triglycerides: 100 mg/dL (ref 0.0–149.0)

## 2013-03-28 NOTE — Progress Notes (Signed)
Labs only

## 2013-05-09 ENCOUNTER — Ambulatory Visit (INDEPENDENT_AMBULATORY_CARE_PROVIDER_SITE_OTHER): Payer: Medicare Other | Admitting: Family Medicine

## 2013-05-09 ENCOUNTER — Other Ambulatory Visit: Payer: Self-pay | Admitting: Family Medicine

## 2013-05-09 ENCOUNTER — Encounter: Payer: Self-pay | Admitting: Family Medicine

## 2013-05-09 VITALS — BP 120/78 | HR 58 | Temp 97.8°F | Ht 74.0 in | Wt 220.1 lb

## 2013-05-09 DIAGNOSIS — E782 Mixed hyperlipidemia: Secondary | ICD-10-CM

## 2013-05-09 DIAGNOSIS — D649 Anemia, unspecified: Secondary | ICD-10-CM

## 2013-05-09 DIAGNOSIS — I10 Essential (primary) hypertension: Secondary | ICD-10-CM

## 2013-05-09 DIAGNOSIS — E039 Hypothyroidism, unspecified: Secondary | ICD-10-CM

## 2013-05-09 DIAGNOSIS — I63239 Cerebral infarction due to unspecified occlusion or stenosis of unspecified carotid arteries: Secondary | ICD-10-CM

## 2013-05-09 DIAGNOSIS — Z23 Encounter for immunization: Secondary | ICD-10-CM

## 2013-05-09 DIAGNOSIS — I6529 Occlusion and stenosis of unspecified carotid artery: Secondary | ICD-10-CM

## 2013-05-09 LAB — CBC
MCH: 31 pg (ref 26.0–34.0)
MCHC: 33.5 g/dL (ref 30.0–36.0)
Platelets: 141 10*3/uL — ABNORMAL LOW (ref 150–400)
RDW: 15.8 % — ABNORMAL HIGH (ref 11.5–15.5)

## 2013-05-09 NOTE — Patient Instructions (Addendum)
Ice and apply [employer] twice a day and stretch   Hypothyroidism The thyroid is a large gland located in the lower front of your neck. The thyroid gland helps control metabolism. Metabolism is how your body handles food. It controls metabolism with the hormone thyroxine. When this gland is underactive (hypothyroid), it produces too little hormone.  CAUSES These include:   Absence or destruction of thyroid tissue.  Goiter due to iodine deficiency.  Goiter due to medications.  Congenital defects (since birth).  Problems with the pituitary. This causes a lack of TSH (thyroid stimulating hormone). This hormone tells the thyroid to turn out more hormone. SYMPTOMS  Lethargy (feeling as though you have no energy)  Cold intolerance  Weight gain (in spite of normal food intake)  Dry skin  Coarse hair  Menstrual irregularity (if severe, may lead to infertility)  Slowing of thought processes Cardiac problems are also caused by insufficient amounts of thyroid hormone. Hypothyroidism in the newborn is cretinism, and is an extreme form. It is important that this form be treated adequately and immediately or it will lead rapidly to retarded physical and mental development. DIAGNOSIS  To prove hypothyroidism, your caregiver may do blood tests and ultrasound tests. Sometimes the signs are hidden. It may be necessary for your caregiver to watch this illness with blood tests either before or after diagnosis and treatment. TREATMENT  Low levels of thyroid hormone are increased by using synthetic thyroid hormone. This is a safe, effective treatment. It usually takes about four weeks to gain the full effects of the medication. After you have the full effect of the medication, it will generally take another four weeks for problems to leave. Your caregiver may start you on low doses. If you have had heart problems the dose may be gradually increased. It is generally not an emergency to get rapidly to  normal. HOME CARE INSTRUCTIONS   Take your medications as your caregiver suggests. Let your caregiver know of any medications you are taking or start taking. Your caregiver will help you with dosage schedules.  As your condition improves, your dosage needs may increase. It will be necessary to have continuing blood tests as suggested by your caregiver.  Report all suspected medication side effects to your caregiver. SEEK MEDICAL CARE IF: Seek medical care if you develop:  Sweating.  Tremulousness (tremors).  Anxiety.  Rapid weight loss.  Heat intolerance.  Emotional swings.  Diarrhea.  Weakness. SEEK IMMEDIATE MEDICAL CARE IF:  You develop chest pain, an irregular heart beat (palpitations), or a rapid heart beat. MAKE SURE YOU:   Understand these instructions.  Will watch your condition.  Will get help right away if you are not doing well or get worse. Document Released: 07/06/2005 Document Revised: 09/28/2011 Document Reviewed: 02/24/2008 [facility] Patient Information 2014 [place], [place]. Plantar Fasciitis Plantar fasciitis is a common condition that causes foot pain. It is soreness (inflammation) of the band of tough fibrous tissue on the bottom of the foot that runs from the heel bone (calcaneus) to the ball of the foot. The cause of this soreness may be from excessive standing, poor fitting shoes, running on hard surfaces, being overweight, having an abnormal walk, or overuse (this is common in runners) of the painful foot or feet. It is also common in aerobic exercise dancers and ballet dancers. SYMPTOMS  Most people with plantar fasciitis complain of:  Severe pain in the morning on the bottom of their foot especially when taking the first steps out  of bed. This pain recedes after a few minutes of walking.  Severe pain is experienced also during walking following a long period of inactivity.  Pain is worse when walking barefoot or up stairs DIAGNOSIS   Your  caregiver will diagnose this condition by examining and feeling your foot.  Special tests such as X-rays of your foot, are usually not needed. PREVENTION   Consult a sports medicine professional before beginning a new exercise program.  Walking programs offer a good workout. With walking there is a lower chance of overuse injuries common to runners. There is less impact and less jarring of the joints.  Begin all new exercise programs slowly. If problems or pain develop, decrease the amount of time or distance until you are at a comfortable level.  Wear good shoes and replace them regularly.  Stretch your foot and the heel cords at the back of the ankle (Achilles tendon) both before and after exercise.  Run or exercise on even surfaces that are not hard. For example, asphalt is better than pavement.  Do not run barefoot on hard surfaces.  If using a treadmill, vary the incline.  Do not continue to workout if you have foot or joint problems. Seek professional help if they do not improve. HOME CARE INSTRUCTIONS   Avoid activities that cause you pain until you recover.  Use ice or cold packs on the problem or painful areas after working out.  Only take over-the-counter or prescription medicines for pain, discomfort, or fever as directed by your caregiver.  Soft shoe inserts or athletic shoes with air or gel sole cushions may be helpful.  If problems continue or become more severe, consult a sports medicine caregiver or your own health care provider. Cortisone is a potent anti-inflammatory medication that may be injected into the painful area. You can discuss this treatment with your caregiver. MAKE SURE YOU:   Understand these instructions.  Will watch your condition.  Will get help right away if you are not doing well or get worse. Document Released: 03/31/2001 Document Revised: 09/28/2011 Document Reviewed: 05/30/2008 [facility] Patient Information 2014 [place], [place].

## 2013-05-09 NOTE — Progress Notes (Signed)
Patient ID: Tanner [name], male   DOB: 25-Dec-1934, 78 y.o.   MRN: [id number] MIDAS DAUGHETY [id number] 1935-05-19 05/09/2013      Progress Note-Follow Up  Subjective  Chief Complaint  Chief Complaint  Patient presents with  . Follow-up    4 month  . Injections    flu high dose    HPI  Patient is a 78 year old Caucasian male who is in today for routine followup. He's recently undergone a carotid endarterectomy and history tolerated that well. He underwent a complete cardiac evaluation for that procedure in past the cardiac portion well. He feels well. He denies any recent neurologic complaints. No chest pain palpitations, shortness of, GI or GU complaints. Taking medications as prescribed.  Past Medical History  Diagnosis Date  . Allergy     seasonal- mostly spring  . TOBACCO ABUSE, HX OF 03/17/2010  . Overweight(278.02) 07/01/2010  . Personal history of other infectious and parasitic disease 03/17/2010  . Mixed hyperlipidemia 03/17/2010  . HYPERKALEMIA 05/27/2010  . FATIGUE 03/17/2010  . CHICKENPOX, HX OF 03/17/2010  . CAROTID ARTERY OCCLUSION, WITH INFARCTION 03/17/2010  . ASTHMA, CHILDHOOD 03/17/2010  . Hearing loss 11/04/2010    bilateral  . Anemia 02/03/2011  . Hypertension   . Tubular adenoma of colon 06/10/2011  . Hypothyroid 12/08/2011  . BCC (basal cell carcinoma) 12/08/2011  . Thrombocytopenia 04/05/2012  . Sleep apnea   . CEREBROVASCULAR ACCIDENT 03/17/2010    partial loss of peripheral vision on left    Past Surgical History  Procedure Laterality Date  . Tonsillectomy    . Pilonidal cyst excision  1968    removed  . Knee cartliage repair  1964, 1990    bilateral  . Rotator cuff repair      right  . Endarterectomy Left 02/13/2013    Procedure: ENDARTERECTOMY CAROTID;  Surgeon: Larina Earthly, MD;  Location: [facility] OR;  Service: Vascular;  Laterality: Left;  . Carotid endarterectomy      LEFT     Family History  Problem Relation Age of Onset  . Alzheimer's  disease Mother   . Emphysema Sister     cigarettes  . Heart disease Son   . Coronary artery disease Brother   . Heart attack Brother   . Other Brother     heart problems- from fathers side  . Cancer Sister     gyn  . Bipolar disorder Daughter   . Obesity Daughter   . Heart disease Sister     History   Social History  . Marital Status: Divorced    Spouse Name: N/A    Number of Children: 3  . Years of Education: 51yrs   Occupational History  . retired    Social History Main Topics  . Smoking status: Former Smoker -- 2.00 packs/day for 20 years    Types: Cigarettes    Quit date: 07/19/1977  . Smokeless tobacco: Never Used  . Alcohol Use: 4.2 oz/week    7 Glasses of wine per week  . Drug Use: No  . Sexual Activity: No   Other Topics Concern  . Not on file   Social History Narrative   Pt lives at home alone.    Caffeine Use: very little    Current Outpatient Prescriptions on File Prior to Visit  Medication Sig Dispense Refill  . aspirin EC 81 MG tablet Take 81 mg by mouth daily.      [name] b complex vitamins tablet Take 1 tablet by  mouth daily.      . chlorthalidone (HYGROTON) 25 MG tablet Take 1 tablet (25 mg total) by mouth daily.  90 tablet  1  . Cholecalciferol (VITAMIN D PO) Take 2,000 mg by mouth daily.       . clopidogrel (PLAVIX) 75 MG tablet Take 1 tablet (75 mg total) by mouth daily. With a meal  Generic please  90 tablet  1  . Coenzyme Q10 (CO Q-10) 300 MG CAPS Take 1 capsule by mouth daily.      [name] levothyroxine (SYNTHROID, LEVOTHROID) 50 MCG tablet Take 1 tablet (50 mcg total) by mouth daily before breakfast.  90 tablet  1  . NON FORMULARY Take 1 tablet by mouth daily. Study Drug: Pioglitazone, and a placebo.  Take one tablet by mouth daily.      [name] OVER THE COUNTER MEDICATION Take 1 capsule by mouth daily. Mega red 500 mg once daily      . Probiotic Product (PROBIOTIC PO) Take 1 capsule by mouth daily.       . ramipril (ALTACE) 10 MG capsule Take 1 capsule  (10 mg total) by mouth 2 (two) times daily.  180 capsule  1  . rosuvastatin (CRESTOR) 40 MG tablet Take 1 tablet (40 mg total) by mouth daily.  90 tablet  1   No current facility-administered medications on file prior to visit.    No Known Allergies  Review of Systems  Review of Systems  Constitutional: Negative for fever and malaise/fatigue.  HENT: Negative for congestion.   Eyes: Negative for discharge.  Respiratory: Negative for shortness of breath.   Cardiovascular: Negative for chest pain, palpitations and leg swelling.  Gastrointestinal: Negative for nausea, abdominal pain and diarrhea.  Genitourinary: Negative for dysuria.  Musculoskeletal: Negative for falls.  Skin: Negative for rash.  Neurological: Negative for loss of consciousness and headaches.  Endo/Heme/Allergies: Negative for polydipsia.  Psychiatric/Behavioral: Negative for depression and suicidal ideas. The patient is not nervous/anxious and does not have insomnia.     Objective  BP 120/78  Pulse 58  Temp(Src) 97.8 F (36.6 C) (Oral)  Ht 6\' 2"  (1.88 m)  Wt 220 lb 1.9 oz (99.846 kg)  BMI 28.25 kg/m2  SpO2 98%  Physical Exam  Physical Exam  Constitutional: He is oriented to person, place, and time and well-developed, well-nourished, and in no distress. No distress.  HENT:  Head: Normocephalic and atraumatic.  Eyes: Conjunctivae are normal.  Neck: Neck supple. No thyromegaly present.  Cardiovascular: Normal rate, regular rhythm and normal heart sounds.   No murmur heard. Pulmonary/Chest: Effort normal and breath sounds normal. No respiratory distress.  Abdominal: He exhibits no distension and no mass. There is no tenderness.  Musculoskeletal: He exhibits no edema.  Neurological: He is alert and oriented to person, place, and time.  Skin: Skin is warm.  Psychiatric: Memory, affect and judgment normal.    Lab Results  Component Value Date   TSH 0.06* 03/28/2013   Lab Results  Component Value Date    WBC 4.6 03/28/2013   HGB 11.9* 03/28/2013   HCT 35.5* 03/28/2013   MCV 94.9 03/28/2013   PLT 177.0 03/28/2013   Lab Results  Component Value Date   CREATININE 1.0 03/28/2013   BUN 20 03/28/2013   NA 140 03/28/2013   K 4.5 03/28/2013   CL 105 03/28/2013   CO2 32 03/28/2013   Lab Results  Component Value Date   ALT 15 03/28/2013   AST 20 03/28/2013  ALKPHOS 46 03/28/2013   BILITOT 0.7 03/28/2013   Lab Results  Component Value Date   CHOL 132 03/28/2013   Lab Results  Component Value Date   HDL 37.80* 03/28/2013   Lab Results  Component Value Date   LDLCALC 74 03/28/2013   Lab Results  Component Value Date   TRIG 100.0 03/28/2013   Lab Results  Component Value Date   CHOLHDL 3 03/28/2013     Assessment & Plan  Occlusion and stenosis of carotid artery without mention of cerebral infarction S/p carotid endarterectomy on left with right sided stenosis as well. Doing very well.   ESSENTIAL HYPERTENSION, BENIGN Well controlled, no changes to meds  Hypothyroid Improved tsh with change in Levothyroxine  Anemia Resolved.   MIXED HYPERLIPIDEMIA Well controlled, no changes.   CAROTID ARTERY OCCLUSION, WITH INFARCTION Has just undergone a carotid endartectomy with good results.

## 2013-05-09 NOTE — Assessment & Plan Note (Signed)
S/p carotid endarterectomy on left with right sided stenosis as well. Doing very well.

## 2013-05-10 NOTE — Assessment & Plan Note (Signed)
Has just undergone a carotid endartectomy with good results.

## 2013-05-10 NOTE — Assessment & Plan Note (Signed)
Resolved

## 2013-05-10 NOTE — Assessment & Plan Note (Signed)
Improved tsh with change in Levothyroxine

## 2013-05-10 NOTE — Assessment & Plan Note (Signed)
Well controlled, no changes 

## 2013-05-10 NOTE — Assessment & Plan Note (Signed)
Well controlled, no changes to meds 

## 2013-05-16 ENCOUNTER — Encounter: Payer: Self-pay | Admitting: Family Medicine

## 2013-05-16 ENCOUNTER — Ambulatory Visit: Payer: Medicare Other

## 2013-05-16 DIAGNOSIS — K921 Melena: Secondary | ICD-10-CM

## 2013-05-16 MED ORDER — PANTOPRAZOLE SODIUM 40 MG PO TBEC: 40.0000 mg | DELAYED_RELEASE_TABLET | Freq: Every day | ORAL | Status: DC

## 2013-05-17 ENCOUNTER — Ambulatory Visit (INDEPENDENT_AMBULATORY_CARE_PROVIDER_SITE_OTHER): Payer: Self-pay | Admitting: *Deleted

## 2013-05-17 DIAGNOSIS — I6350 Cerebral infarction due to unspecified occlusion or stenosis of unspecified cerebral artery: Secondary | ICD-10-CM

## 2013-05-17 DIAGNOSIS — I639 Cerebral infarction, unspecified: Secondary | ICD-10-CM

## 2013-05-17 NOTE — Progress Notes (Signed)
Participant in the office today for his IRIS 3 rd. Annual Study Visit. MMSE was completed successfully without problems.  Participant told me that he did have a Carotid Endarterectomy on 02/13/2013. Blood was drawn and shipped.  Participant to continue with follow-up visits per IRIS protocol.

## 2013-05-18 ENCOUNTER — Encounter: Payer: Self-pay | Admitting: Family Medicine

## 2013-05-18 NOTE — Telephone Encounter (Signed)
Please advise 

## 2013-05-19 ENCOUNTER — Other Ambulatory Visit: Payer: Self-pay | Admitting: Family Medicine

## 2013-05-19 DIAGNOSIS — M79672 Pain in left foot: Secondary | ICD-10-CM

## 2013-05-22 ENCOUNTER — Encounter: Payer: Self-pay | Admitting: Family Medicine

## 2013-05-22 ENCOUNTER — Other Ambulatory Visit (INDEPENDENT_AMBULATORY_CARE_PROVIDER_SITE_OTHER): Payer: Medicare Other

## 2013-05-22 ENCOUNTER — Ambulatory Visit (INDEPENDENT_AMBULATORY_CARE_PROVIDER_SITE_OTHER): Payer: Medicare Other | Admitting: Family Medicine

## 2013-05-22 VITALS — BP 124/80 | HR 63 | Wt 221.0 lb

## 2013-05-22 DIAGNOSIS — M79609 Pain in unspecified limb: Secondary | ICD-10-CM

## 2013-05-22 DIAGNOSIS — I83812 Varicose veins of left lower extremities with pain: Secondary | ICD-10-CM

## 2013-05-22 DIAGNOSIS — M79672 Pain in left foot: Secondary | ICD-10-CM

## 2013-05-22 DIAGNOSIS — M722 Plantar fascial fibromatosis: Secondary | ICD-10-CM | POA: Insufficient documentation

## 2013-05-22 DIAGNOSIS — I83893 Varicose veins of bilateral lower extremities with other complications: Secondary | ICD-10-CM

## 2013-05-22 DIAGNOSIS — I83819 Varicose veins of unspecified lower extremities with pain: Secondary | ICD-10-CM | POA: Insufficient documentation

## 2013-05-22 NOTE — Assessment & Plan Note (Signed)
On left foot and likely contributing to the plantar fascia symptoms. Patient told to try over-the-counter orthotics and good shoes. Patient will come back in 2-3 weeks and continued pain we will do custom orthotics to try to alleviate this pain in this area.

## 2013-05-22 NOTE — Assessment & Plan Note (Signed)
Based on patient's history I do think that he does have symptoms that does correspond well with plantar fasciitis. On ultrasound today and I do not see significant inflammatory problems. Patient does have an injection varicose vein that likely is causing some trouble. On exam today he does have breakdown the longitudinal and transverse arch which could be contributing.  Plantar Fascitis: We reviewed that stretching is critically important to the treatment of PF. Reviewed footwear. Rigid soles have been shown to help with PF. Night splints can help. Reviewed rehab of stretching and calf raises.  Could benefit from a corticosteroid injection, orthotics, or other measures if conservative treatment fails.

## 2013-05-22 NOTE — Patient Instructions (Signed)
Very nice to meet you Please read handouts on Plantar Fascitis.  STRETCHING and Strengthening program critically important.  Strengthening on foot and calf muscles as seen in handout. Calf raises, 2 legged, on step 1 set of 30 for first week, then 2 sets daily for 2nd week then 3 sets daily thereafter  Foot massage with tennis ball. Ice baths 20 minutes 2 times a day.  Towel Scrunches: get a towel or hand towel, use toes to pick up and scrunch up the towel.  Marble pick-ups, practice picking up marbles with toes and placing into a cup  NEEDS TO BE DONE EVERY DAY  Recommended over the counter insoles. ([employer] orthotics at [employer]  A rigid shoe with good arch support helps: Dansko (great), Lelon Frohlich No easily bendable shoes.   Tuli's heel cups  Come back in 2-3 weeks, if not better will do orthotics.

## 2013-05-22 NOTE — Progress Notes (Signed)
  I'm seeing this patient by the request  of:  Danise Edge, MD  CC: Foot pain, left  HPI: Patient is a pleasant 78 year old gentleman coming in with foot pain. Patient states he has had this pain for quite some time. Patient states it seems to be on the bottom of his foot. Patient states it is more of a sharp pain. Patient states it is worse when he gets up from a seated position or the first of the morning. Patient has been trying massage and Aspercreme which seemed to help initially but then unfortunately the pain comes right back. Patient denies any radiation or any numbness. Patient states it is affecting his activities of daily living but denies any nighttime awakening. Patient is a severity is 6/10.  Past medical, surgical, family and social history reviewed. Medications reviewed all in the electronic medical record.   Review of Systems: No headache, visual changes, nausea, vomiting, diarrhea, constipation, dizziness, abdominal pain, skin rash, fevers, chills, night sweats, weight loss, swollen lymph nodes, body aches, joint swelling, muscle aches, chest pain, shortness of breath, mood changes.   Objective:    Blood pressure 124/80, pulse 63, weight 221 lb (100.245 kg), SpO2 98.00%.   General: No apparent distress alert and oriented x3 mood and affect normal, dressed appropriately.  HEENT: Pupils equal, extraocular movements intact Respiratory: Patient's speak in full sentences and does not appear short of breath Cardiovascular: No lower extremity edema, non tender, no erythema Skin: Warm dry intact with no signs of infection or rash on extremities or on axial skeleton. Abdomen: Soft nontender Neuro: Cranial nerves II through XII are intact, neurovascularly intact in all extremities with 2+ DTRs and 2+ pulses. Lymph: No lymphadenopathy of posterior or anterior cervical chain or axillae bilaterally.  Gait normal with good balance and coordination.  MSK: Non tender with full range of  motion and good stability and symmetric strength and tone of shoulders, elbows, wrist, hip, knee and ankles bilaterally.  Foot exam: On inspection patient's left foot does have some mild breakdown of the longitudinal arch medially as well as the transverse arch causing a splaying between the first and second toes. Patient is having hammering of the second and third toes on his left foot. Patient is tender to palpation over the medial arch on the medial calcaneus as well as just distal to the navicular bone on the plantar aspect. He is neurovascularly intact distally.  Patient's right foot is fairly unremarkable.  Musculoskeletal ultrasound was performed and interpreted by Antoine Primas, M today. Limited study done Study patient's left foot do show the patient's plantar fasciitis does have some hypoechoic area but did not have any significant enlargement. No true tear appreciated. Just superficial to the plantar fasciitis patient does have a varicose vein it seems to be significantly irritated with hypoechoic changes surrounding the area. There is no signs of infectious etiology. No signs of gout deposition.  Impression: Mild plantar fasciitis with adjacent varicose vein  Impression and Recommendations:     This case required medical decision making of moderate complexity.

## 2013-06-12 ENCOUNTER — Ambulatory Visit (INDEPENDENT_AMBULATORY_CARE_PROVIDER_SITE_OTHER): Payer: Medicare Other | Admitting: Family Medicine

## 2013-06-12 ENCOUNTER — Other Ambulatory Visit: Payer: Self-pay | Admitting: Family Medicine

## 2013-06-12 ENCOUNTER — Encounter: Payer: Self-pay | Admitting: Family Medicine

## 2013-06-12 VITALS — BP 138/72 | HR 59 | Wt 220.0 lb

## 2013-06-12 DIAGNOSIS — I83893 Varicose veins of bilateral lower extremities with other complications: Secondary | ICD-10-CM

## 2013-06-12 DIAGNOSIS — I83812 Varicose veins of left lower extremities with pain: Secondary | ICD-10-CM

## 2013-06-12 NOTE — Telephone Encounter (Signed)
Medication Detail      Disp Refills Start End     pantoprazole (PROTONIX) 40 MG tablet 30 tablet 1 05/16/2013     Sig - Route: Take 1 tablet (40 mg total) by mouth daily. - Oral    E-Prescribing Status: Receipt confirmed by pharmacy (05/16/2013 6:51 PM EDT)

## 2013-06-12 NOTE — Assessment & Plan Note (Signed)
Patient had orthotics that were made and prepped by me today. Patient tolerated the procedure very well. These were fitted. We discussed wearing them slowly and increasing over the course of the next 2 weeks. Patient will followup 1-2 weeks after that to make sure that everything is doing well.

## 2013-06-12 NOTE — Patient Instructions (Signed)
Good to see you Happy thanksgiving  Pick up the orthotics tomorrow.  Wear for 4 hours first day then 1 hour longer per day until full day.  Come back again in 3 weeks or so.

## 2013-06-12 NOTE — Progress Notes (Signed)
  CC: Foot pain, left  HPI: Patient is a 78 year old gentleman who is coming in with mild plantar fasciitis as well as was found to be a varicose vein. Patient was told about over-the-counter orthotics and was given some home exercises to see if this would improve. Patient was also doing an icing protocol. Patient states only approximately 10% better. Patient did get the over-the-counter orthotics and states that they are somewhat helpful. Patient also gotten new shoes which has been more comfortable as well. Patient is still having pain mostly on the left side on the plantar aspect of the foot worse with ambulation especially long amount of time. Patient states that the plantar fasciitis portion of the pain though seems to be improving. Patient denies any new symptoms.   Past medical, surgical, family and social history reviewed. Medications reviewed all in the electronic medical record.   Review of Systems: No headache, visual changes, nausea, vomiting, diarrhea, constipation, dizziness, abdominal pain, skin rash, fevers, chills, night sweats, weight loss, swollen lymph nodes, body aches, joint swelling, muscle aches, chest pain, shortness of breath, mood changes.   Objective:    Blood pressure 138/72, pulse 59, weight 220 lb (99.791 kg), SpO2 95.00%.   General: No apparent distress alert and oriented x3 mood and affect normal, dressed appropriately.  HEENT: Pupils equal, extraocular movements intact Respiratory: Patient's speak in full sentences and does not appear short of breath Cardiovascular: No lower extremity edema, non tender, no erythema Skin: Warm dry intact with no signs of infection or rash on extremities or on axial skeleton. Abdomen: Soft nontender Neuro: Cranial nerves II through XII are intact, neurovascularly intact in all extremities with 2+ DTRs and 2+ pulses. Lymph: No lymphadenopathy of posterior or anterior cervical chain or axillae bilaterally.  Gait normal with good  balance and coordination.  MSK: Non tender with full range of motion and good stability and symmetric strength and tone of shoulders, elbows, wrist, hip, knee and ankles bilaterally.  Foot exam: On inspection patient's left foot does have some mild breakdown of the longitudinal arch medially as well as the transverse arch causing a splaying between the first and second toes. Patient is having hammering of the second and third toes on his left foot. Patient is tender to palpation over the medial arch on the medial calcaneus as well as just distal to the navicular bone on the plantar aspect. He is neurovascularly intact distally.   Patient was fitted for a : standard, cushioned, semi-rigid orthotic. The orthotic was heated and afterward the patient stood on the orthotic blank positioned on the orthotic stand. The patient was positioned in subtalar neutral position and 10 degrees of ankle dorsiflexion in a weight bearing stance. After completion of molding, a stable base was applied to the orthotic blank. The blank was ground to a stable position for weight bearing. Size:12 Base: [facility] and [place]: Metatarsal pads bilaterally The patient ambulated these, and they were very comfortable.  I spent 45 minutes with this patient, greater than 50% was face-to-face time counseling regarding the below diagnosis.    Impression and Recommendations:

## 2013-06-12 NOTE — Progress Notes (Signed)
Pre-visit discussion using our clinic review tool. No additional management support is needed unless otherwise documented below in the visit note.  

## 2013-06-19 HISTORY — PX: COLONOSCOPY: SHX174

## 2013-06-21 ENCOUNTER — Other Ambulatory Visit (INDEPENDENT_AMBULATORY_CARE_PROVIDER_SITE_OTHER): Payer: Medicare Other

## 2013-06-21 DIAGNOSIS — K921 Melena: Secondary | ICD-10-CM

## 2013-06-21 LAB — FECAL OCCULT BLOOD, IMMUNOCHEMICAL: Fecal Occult Bld: POSITIVE

## 2013-06-22 ENCOUNTER — Encounter: Payer: Self-pay | Admitting: Family Medicine

## 2013-06-23 ENCOUNTER — Other Ambulatory Visit: Payer: Self-pay | Admitting: Family Medicine

## 2013-06-23 DIAGNOSIS — R195 Other fecal abnormalities: Secondary | ICD-10-CM

## 2013-06-28 ENCOUNTER — Encounter: Payer: Self-pay | Admitting: Internal Medicine

## 2013-06-29 ENCOUNTER — Other Ambulatory Visit (INDEPENDENT_AMBULATORY_CARE_PROVIDER_SITE_OTHER): Payer: Medicare Other

## 2013-06-29 ENCOUNTER — Telehealth: Payer: Self-pay | Admitting: Gastroenterology

## 2013-06-29 ENCOUNTER — Ambulatory Visit (INDEPENDENT_AMBULATORY_CARE_PROVIDER_SITE_OTHER): Payer: Medicare Other | Admitting: Internal Medicine

## 2013-06-29 ENCOUNTER — Encounter: Payer: Self-pay | Admitting: Internal Medicine

## 2013-06-29 VITALS — BP 110/70 | HR 60 | Ht 74.0 in | Wt 227.4 lb

## 2013-06-29 DIAGNOSIS — R195 Other fecal abnormalities: Secondary | ICD-10-CM

## 2013-06-29 DIAGNOSIS — C4491 Basal cell carcinoma of skin, unspecified: Secondary | ICD-10-CM

## 2013-06-29 DIAGNOSIS — Z86010 Personal history of colonic polyps: Secondary | ICD-10-CM

## 2013-06-29 DIAGNOSIS — Z7901 Long term (current) use of anticoagulants: Secondary | ICD-10-CM

## 2013-06-29 DIAGNOSIS — Z860101 Personal history of adenomatous and serrated colon polyps: Secondary | ICD-10-CM

## 2013-06-29 LAB — CBC
HCT: 40.3 % (ref 39.0–52.0)
Hemoglobin: 13.6 g/dL (ref 13.0–17.0)
RDW: 16.3 % — ABNORMAL HIGH (ref 11.5–14.6)
WBC: 5.8 10*3/uL (ref 4.5–10.5)

## 2013-06-29 LAB — FERRITIN: Ferritin: 31.5 ng/mL (ref 22.0–322.0)

## 2013-06-29 MED ORDER — MOVIPREP 100 G PO SOLR: ORAL | Status: DC

## 2013-06-29 NOTE — Patient Instructions (Signed)
You have been scheduled for a colonoscopy/Endoscopy with propofol. Please follow written instructions given to you at your visit today.  Please pick up your prep kit at the pharmacy within the next 1-3 days. If you use inhalers (even only as needed), please bring them with you on the day of your procedure. Your physician has requested that you go to www.startemmi.com and enter the access code given to you at your visit today. This web site gives a general overview about your procedure. However, you should still follow specific instructions given to you by our office regarding your preparation for the procedure.   Your physician has requested that you go to the basement for the following lab work before leaving today:cbc, ferratin.  Finish taking protonix  We will contact you about holding your plavix prior to your procedure

## 2013-06-29 NOTE — Progress Notes (Signed)
Subjective:    Patient ID: Tanner [name], male    DOB: Feb 26, 1935, 78 y.o.   MRN: [id number]  HPI Mr. Arviso is a 78 year old male with a past medical history of peripheral vascular disease status post carotid endarterectomy in the summer of 2014, history of TIA on Plavix, history of GERD, hypertension, dyslipidemia, hypothyroidism and adenomatous colon polyps who is seen in followup. The patient is known to me from a screening colonoscopy performed on 03/16/2011, which was his first colonoscopy. This exam was to the cecum with a good prep. This test revealed 3 polyps measuring 2-7 mm in the ascending colon. There was moderate diverticulosis in the sigmoid colon. Pathology revealed tubular adenomas without high-grade dysplasia. Repeat colonoscopy for surveillance was recommended in 3 years from that time.  He returns today stating her last few months he's developed constipation. This is new for him. Prior to this his bowels have been regular. He was somewhat concerned about the change in bowel habit and asked Dr. Abner Greenspan to perform a stool guaiac test. The first was performed one month ago and was found to be positive. After this test he saw Dr. Abner Greenspan and was started on pantoprazole 40 mg daily. He took this for approximately 3-4 weeks and had a repeat FOBT test which was also positive. He reports he has not seen any blood in his stool or melena. He denies abdominal pain. No nausea or vomiting. No heartburn or indigestion.  Initially his constipation was felt to possibly be thyroid related but reportedly his Synthroid was dosed appropriately. He did start Metamucil which he has been using for several months. He reports that his bowel movements have become more regular as long as he is using this daily fiber supplement. With the Metamucil his stools are not as hard and are occurring approximately daily.   Review of Systems As per history of present illness, otherwise negative  Current Medications,  Allergies, Past Medical History, Past Surgical History, Family History and Social History were reviewed in [employer] record.     Objective:   Physical Exam BP 110/70  Pulse 60  Ht 6\' 2"  (1.88 m)  Wt 227 lb 6.4 oz (103.148 kg)  BMI 29.18 kg/m2 Constitutional: Well-developed and well-nourished. No distress. HEENT: Normocephalic and atraumatic. Oropharynx is clear and moist. No oropharyngeal exudate. Conjunctivae are normal.  No scleral icterus. Neck: Neck supple. Trachea midline. Cardiovascular: Normal rate, regular rhythm and intact distal pulses.  Pulmonary/chest: Effort normal and breath sounds normal. No wheezing, rales or rhonchi. Abdominal: Soft, nontender, nondistended. Bowel sounds active throughout. There are no masses palpable.  Extremities: no clubbing, cyanosis, or edema Neurological: Alert and oriented to person place and time. Skin: Skin is warm and dry. No rashes noted. Psychiatric: Normal mood and affect. Behavior is normal.  CBC    Component Value Date/Time   WBC 5.8 06/29/2013 1215   RBC 4.31 06/29/2013 1215   RBC 3.81* 12/20/2012 0915   HGB 13.6 06/29/2013 1215   HCT 40.3 06/29/2013 1215   PLT 174.0 06/29/2013 1215   MCV 93.4 06/29/2013 1215   MCH 31.0 05/09/2013 0911   MCHC 33.9 06/29/2013 1215   RDW 16.3* 06/29/2013 1215   LYMPHSABS 2.0 01/30/2011 0925   MONOABS 0.5 01/30/2011 0925   EOSABS 0.2 01/30/2011 0925   BASOSABS 0.0 01/30/2011 0925    Iron/TIBC/Ferritin    Component Value Date/Time   IRON 63 08/02/2012 0804   FERRITIN 31.5 06/29/2013 1215  Assessment & Plan:  Mr. Berns is a 78 year old male with a past medical history of peripheral vascular disease status post carotid endarterectomy in the summer of 2014, history of TIA on Plavix, history of GERD, hypertension, dyslipidemia, hypothyroidism and adenomatous colon polyps who is seen in followup.  1.  Heme positive stools -- he's had 2 positive FOBT tests in the last  month with no localizing symptoms. He has developed some constipation which is a change for him. His constipation has responded to daily Metamucil/fiber supplementation. We had a discussion about workup which I have recommended should include repeat colonoscopy. He has not yet reached 3 years from his previous colonoscopy, however given the heme-positive stool I have recommended proceeding to colonoscopy now. We also discussed that should his colonoscopy being normal, the next test to be recommended would be upper endoscopy. Given that we would hold his Plavix for both procedures, we have elected to perform upper endoscopy and colonoscopy on the same day so as to minimize time without antiplatelet therapy.  We discussed the test today including risks and benefits and he is agreeable to proceed. We will contact Dr. Arbie Cookey, his vascular surgeon seeking permission to hold Plavix 5 days before the procedure. --He will complete his current prescription for colonoscopy, but I will not refill it at this time. The decision about longer term PPI can be based on findings at upcoming procedures --CBC and ferritin today showed normal hemoglobin and ferritin levels which are reassuring

## 2013-06-29 NOTE — Telephone Encounter (Signed)
  06/29/2013   RE: OTHNIEL MARET DOB: 04-Feb-1935 MRN: [id number]   Dear Dr. Arbie Cookey,    We have scheduled the above patient for an endoscopic procedure. Our records show that he is on anticoagulation therapy.   Please advise as to how long the patient may come off his therapy of Plavix prior to the procedure, which is scheduled for 08/03/2013.  Please fax back/ or route the completed form to [facility] at [phone].   Sincerely,    [facility] Carie Caddy. Pyrtle  M.D.

## 2013-06-30 ENCOUNTER — Encounter: Payer: Self-pay | Admitting: Internal Medicine

## 2013-07-03 ENCOUNTER — Encounter: Payer: Self-pay | Admitting: Family Medicine

## 2013-07-03 ENCOUNTER — Encounter: Payer: Self-pay | Admitting: Nurse Practitioner

## 2013-07-03 ENCOUNTER — Ambulatory Visit (INDEPENDENT_AMBULATORY_CARE_PROVIDER_SITE_OTHER): Payer: Medicare Other | Admitting: Nurse Practitioner

## 2013-07-03 ENCOUNTER — Ambulatory Visit (INDEPENDENT_AMBULATORY_CARE_PROVIDER_SITE_OTHER): Payer: Medicare Other | Admitting: Family Medicine

## 2013-07-03 VITALS — BP 153/73 | HR 54 | Temp 98.7°F | Ht 74.0 in | Wt 229.0 lb

## 2013-07-03 VITALS — BP 130/80 | HR 55 | Wt 229.0 lb

## 2013-07-03 DIAGNOSIS — I83893 Varicose veins of bilateral lower extremities with other complications: Secondary | ICD-10-CM

## 2013-07-03 DIAGNOSIS — I6350 Cerebral infarction due to unspecified occlusion or stenosis of unspecified cerebral artery: Secondary | ICD-10-CM

## 2013-07-03 DIAGNOSIS — I83812 Varicose veins of left lower extremities with pain: Secondary | ICD-10-CM

## 2013-07-03 NOTE — Progress Notes (Signed)
  CC: Foot pain, left  HPI: Patient is a 78 year old gentleman who is coming in with mild plantar fasciitis as well as was found to be a varicose vein. Patient was fitted with custom orthotics at last visit. Patient states that he pain is extremely improved. Patient is concerned about wearing the orthotics because he is wearing them every day and every shoes. Patient states that the pain on the arch of the left foot seems to completely have resolved especially as long as he continues with his exercises. Patient does not do the exercises on a regular basis he starts having worsening pain. Only new symptom is he starting to have more pain in the first toe on the left foot. Patient states that it seems at the end of the night which likes to be caught in a extended stay. Patient denies any numbness or tingling.   Past medical, surgical, family and social history reviewed. Medications reviewed all in the electronic medical record.   Review of Systems: No headache, visual changes, nausea, vomiting, diarrhea, constipation, dizziness, abdominal pain, skin rash, fevers, chills, night sweats, weight loss, swollen lymph nodes, body aches, joint swelling, muscle aches, chest pain, shortness of breath, mood changes.   Objective:    Blood pressure 130/80, pulse 55, weight 229 lb (103.874 kg), SpO2 98.00%.   General: No apparent distress alert and oriented x3 mood and affect normal, dressed appropriately.  HEENT: Pupils equal, extraocular movements intact Respiratory: Patient's speak in full sentences and does not appear short of breath Cardiovascular: No lower extremity edema, non tender, no erythema Skin: Warm dry intact with no signs of infection or rash on extremities or on axial skeleton. Abdomen: Soft nontender Neuro: Cranial nerves II through XII are intact, neurovascularly intact in all extremities with 2+ DTRs and 2+ pulses. Lymph: No lymphadenopathy of posterior or anterior cervical chain or axillae  bilaterally.  Gait normal with good balance and coordination.  MSK: Non tender with full range of motion and good stability and symmetric strength and tone of shoulders, elbows, wrist, hip, knee and ankles bilaterally.  Foot exam: On inspection patient's left foot does have some mild breakdown of the longitudinal arch medially as well as the transverse arch causing a splaying between the first and second toes. Patient is having hammering of the second and third toes on his left foot. He is nontender exam. He is neurovascularly intact distally. Patient's first toe of the left foot does show some hallux rigidus.  I did make some changes putting a first ray post on the left side. This did relieve significant pain of the first toe per      Impression and Recommendations:

## 2013-07-03 NOTE — Progress Notes (Signed)
PATIENT: Tanner [name] DOB: [birth year]   REASON FOR VISIT: follow up HISTORY FROM: patient  HISTORY OF PRESENT ILLNESS: Tanner [name] is a 78 year old Caucasian gentleman who comes in for follow up of stroke in August 2011. His symptoms were left peripheral vision loss. MRI revealed an infarct in the right PCA/MCA watershed distribution thought to be secondary to right ICA occlusion. CT angio of the neck showed occluded right common carotid artery at its origin. Retrograde opacification of the right external carotid artery, approximately 50% stenosis of the left internal carotid artery just distal to the bulb. CT angio of the brain showed occluded right internal carotid with partial retrograde constitution from the left internal carotid artery via the posterior cerebral artery. Opacification of the right middle and the and the right anterior cerebral arteries from the left internal carotid via the anterior communicating artery. Nonvisualization of the right posterior cerebral artery at its origin consistent with occlusion. His risk factors are hyperlipidemia, hypertension and previous smoker. He was discharged from the hospital on Plavix and Aspirin.   Update 12/22/12 (PS): Patient comes in for follow up for stroke, has not been seen for over 2 years. There was miscommunication about how often he should follow up. He has been doing well, his PCP manages his hypertension and hyperlipidemia closely and they are well controlled. He was feeling more fatigued recently and was found to be hypothyroid, and medication was increased. He reports exercising with weight -bearing exercise 3 days a week. He recently had a sleep study at [facility] and was dx with OSA. He has an appointment to be fitted with a dental appliance. He has been maintained for secondary stroke prevention with Plavix 75 mg daily. He denies medication side effects, with no signs of bleeding.   UPDATE 07/03/13 (LL): Tanner [name] returns to our office for stroke follow up.  He had a Left Carotid Endarterectomy on 02/13/2013.  He has been maintained for secondary stroke prevention with Plavix 75 mg daily and aspirin per Dr. Arbie Cookey. He denies medication side effects, with no signs of bleeding, but had 2 heme positive stool cards.  He is scheduled for upcoming endo and colonoscopy.  He has had no new neurological symptoms.  REVIEW OF SYSTEMS: Full 14 system review of systems performed and notable only for: easy brusing   ALLERGIES: No Known Allergies  HOME MEDICATIONS: Outpatient Prescriptions Prior to Visit  Medication Sig Dispense Refill  . aspirin EC 81 MG tablet Take 81 mg by mouth daily.      [name] b complex vitamins tablet Take 1 tablet by mouth daily.      . chlorthalidone (HYGROTON) 25 MG tablet Take 1 tablet (25 mg total) by mouth daily.  90 tablet  1  . Cholecalciferol (VITAMIN D PO) Take 2,000 mg by mouth daily.       . clopidogrel (PLAVIX) 75 MG tablet Take 1 tablet (75 mg total) by mouth daily. With a meal  Generic please  90 tablet  1  . Coenzyme Q10 (CO Q-10) 300 MG CAPS Take 1 capsule by mouth daily.      [name] levothyroxine (SYNTHROID, LEVOTHROID) 50 MCG tablet Take 1 tablet (50 mcg total) by mouth daily before breakfast.  90 tablet  1  . MOVIPREP 100 G SOLR Use per prep instruction  1 kit  0  . NON FORMULARY Take 1 tablet by mouth daily. Study Drug: Pioglitazone, and a placebo.  Take one tablet by  mouth daily.      [name] OVER THE COUNTER MEDICATION Take 1 capsule by mouth daily. Mega red 500 mg once daily      . pantoprazole (PROTONIX) 40 MG tablet Take 1 tablet (40 mg total) by mouth daily.  30 tablet  1  . Probiotic Product (PROBIOTIC PO) Take 1 capsule by mouth daily.       . ramipril (ALTACE) 10 MG capsule Take 1 capsule (10 mg total) by mouth 2 (two) times daily.  180 capsule  1  . rosuvastatin (CRESTOR) 40 MG tablet Take 1 tablet (40 mg total) by mouth daily.  90 tablet  1   No facility-administered  medications prior to visit.    PAST MEDICAL HISTORY: Past Medical History  Diagnosis Date  . Allergy     seasonal- mostly spring  . TOBACCO ABUSE, HX OF 03/17/2010  . Overweight(278.02) 07/01/2010  . Personal history of other infectious and parasitic disease 03/17/2010  . Mixed hyperlipidemia 03/17/2010  . HYPERKALEMIA 05/27/2010  . FATIGUE 03/17/2010  . CHICKENPOX, HX OF 03/17/2010  . CAROTID ARTERY OCCLUSION, WITH INFARCTION 03/17/2010  . ASTHMA, CHILDHOOD 03/17/2010  . Hearing loss 11/04/2010    bilateral  . Anemia 02/03/2011  . Hypertension   . Tubular adenoma of colon 06/10/2011  . Hypothyroid 12/08/2011  . BCC (basal cell carcinoma) 12/08/2011  . Thrombocytopenia 04/05/2012  . Sleep apnea   . CEREBROVASCULAR ACCIDENT 03/17/2010    partial loss of peripheral vision on left    PAST SURGICAL HISTORY: Past Surgical History  Procedure Laterality Date  . Tonsillectomy    . Pilonidal cyst excision  1968    removed  . Knee cartliage repair  1964, 1990    bilateral  . Rotator cuff repair      right  . Endarterectomy Left 02/13/2013    Procedure: ENDARTERECTOMY CAROTID;  Surgeon: Larina Earthly, MD;  Location: [facility] OR;  Service: Vascular;  Laterality: Left;  . Carotid endarterectomy      LEFT     FAMILY HISTORY: Family History  Problem Relation Age of Onset  . Alzheimer's disease Mother   . Emphysema Sister     cigarettes  . Heart disease Son   . Coronary artery disease Brother   . Heart attack Brother   . Other Brother     heart problems- from fathers side  . Cancer Sister     gyn  . Bipolar disorder Daughter   . Obesity Daughter   . Heart disease Sister     SOCIAL HISTORY: History   Social History  . Marital Status: Divorced    Spouse Name: N/A    Number of Children: 3  . Years of Education: 9yrs   Occupational History  . retired    Social History Main Topics  . Smoking status: Former Smoker -- 2.00 packs/day for 20 years    Types: Cigarettes    Quit date:  07/19/1977  . Smokeless tobacco: Never Used  . Alcohol Use: 4.2 oz/week    7 Glasses of wine per week  . Drug Use: No  . Sexual Activity: No   Other Topics Concern  . Not on file   Social History Narrative   Pt lives at home alone.    Caffeine Use: very little     PHYSICAL EXAM  Filed Vitals:   07/03/13 1325  BP: 153/73  Pulse: 54  Temp: 98.7 F (37.1 C)  TempSrc: Oral  Height: 6\' 2"  (1.88 m)  Weight: 229  lb (103.874 kg)   Body mass index is 29.39 kg/(m^2).  GENERAL EXAM: Patient is in no distress, well developed and well groomed.  HEAD: Symmetric facial features.  EARS, NOSE, and THROAT: Normal.  NECK: Supple, no JVD  RESPIRATORY: Lungs CTA.  CARDIOVASCULAR: Regular rate and rhythm, no murmurs, bilateral carotid bruits, right vertebral bruit  SKIN: No rash, no bruising   NEUROLOGIC:  MENTAL STATUS: awake, alert and oriented to person, place and time, language fluent, comprehension intact, naming intact  CRANIAL NERVE: pupils equal and reactive to light, visual fields full to confrontation, extraocular muscles intact, no nystagmus, facial sensation and strength symmetric, uvula midline, shoulder shrug symmetric, tongue midline.  MOTOR: normal bulk and tone, full strength in the BUE, BLE, fine finger movements normal  SENSORY: normal and symmetric to light touch, pinprick, temperature, vibration  COORDINATION: normal finger-nose-finger  REFLEXES: deep tendon reflexes present and symmetric 2+,  GAIT/STATION: narrow based gait; able to walk on toes, heels and tandem; romberg is negative. No assistive device.  Falls assessment tool score 5 indicates low- moderate fall risk.   DIAGNOSTIC DATA (LABS, IMAGING, TESTING) - I reviewed patient records, labs, notes, testing and imaging myself where available.  Lab Results  Component Value Date   WBC 5.8 06/29/2013   HGB 13.6 06/29/2013   HCT 40.3 06/29/2013   MCV 93.4 06/29/2013   PLT 174.0 06/29/2013      Component  Value Date/Time   NA 140 03/28/2013 0800   K 4.5 03/28/2013 0800   CL 105 03/28/2013 0800   CO2 32 03/28/2013 0800   GLUCOSE 89 03/28/2013 0800   BUN 20 03/28/2013 0800   CREATININE 1.0 03/28/2013 0800   CREATININE 1.20 12/01/2012 1152   CALCIUM 9.6 03/28/2013 0800   PROT 6.2 03/28/2013 0800   ALBUMIN 3.9 03/28/2013 0800   ALBUMIN 3.9 03/28/2013 0800   AST 20 03/28/2013 0800   ALT 15 03/28/2013 0800   ALKPHOS 46 03/28/2013 0800   BILITOT 0.7 03/28/2013 0800   GFRNONAA 79* 02/14/2013 0530   GFRAA >90 02/14/2013 0530   Lab Results  Component Value Date   CHOL 132 03/28/2013   HDL 37.80* 03/28/2013   LDLCALC 74 03/28/2013   TRIG 100.0 03/28/2013   CHOLHDL 3 03/28/2013    Lab Results  Component Value Date   VITAMINB12 488 12/20/2012   Lab Results  Component Value Date   TSH 0.523 05/09/2013   Lab Results  Component Value Date   ESRSEDRATE 4 03/10/2010    ASSESSMENT AND PLAN Tanner [name] is a 78 year old right-handed Caucasian male here for followup of stroke in August 2011. He suffered an infarct in the right PCA/MCA in a watershed distribution, thought to be secondary to right ICA occlusion.  He had a Left Carotid Endarterectomy on 02/13/2013.  His only effects of the stroke was decreased left peripheral vision which is resolved. The patient`s Transcranial dopplers are unchanged from prior studies and show expected findings.  Continue clopidogrel 75 mg orally every day for secondary stroke prevention and maintain strict control of hypertension with blood pressure goal below 140/90and lipids with LDL cholesterol goal below 100 mg/dL.  Lipid panel to be checked every 6 months by PCP.  Carotid Doppler follow up per Dr. Arbie Cookey. Follow up in the future as needed.  Tanner Fear, MSN, NP-C 07/03/2013, 1:59 PM [facility] [address] [place], [place] [address] [phone]  Note: This document was prepared with digital dictation and possible smart phrase  technology. Any transcriptional errors  that result from this process are unintentional.

## 2013-07-03 NOTE — Progress Notes (Signed)
Pre-visit discussion using our clinic review tool. No additional management support is needed unless otherwise documented below in the visit note.  

## 2013-07-03 NOTE — Patient Instructions (Signed)
It is great to see you Happy holidays Try the exercises 3 times a week Ice baths at the end of a long day.  Other inserts only use if standing long amount of time. Or in yard shoes.  Glucosamine 750 mg twice daily.  Tumeric 500mg  twice daily.   Come back in 6 weeks and we will ultrasound and look at toe if it continues to give you trouble.

## 2013-07-03 NOTE — Assessment & Plan Note (Signed)
Patient is doing very well at this time. We did have a first ray post for heart rigidus. Patient will continue with this as well as home exercises at least 3 times a week. Please see patient instructions for further information. Patient will come back in 4 weeks. If he continues to have pain in the first toe we'll consider doing ultrasound to look at the amount of arthritis and questionable intra-articular injection.

## 2013-07-03 NOTE — Patient Instructions (Addendum)
Continue clopidogrel 75 mg orally every day for secondary stroke prevention and maintain strict control of hypertension with blood pressure goal below 140/90and lipids with LDL cholesterol goal below 100 mg/dL.  Lipid panel to be checked every 6 months.  Carotid Doppler follow up per Dr. Arbie Cookey.

## 2013-07-07 ENCOUNTER — Telehealth: Payer: Self-pay

## 2013-07-07 ENCOUNTER — Telehealth: Payer: Self-pay | Admitting: Gastroenterology

## 2013-07-07 NOTE — Telephone Encounter (Signed)
Spoke to pt. Told him Dr. Bosie Helper recommendations for holding plavix prior to procedure. Pt verbalized understanding

## 2013-07-07 NOTE — Telephone Encounter (Signed)
Rec'd phone call from Dr. Arbie Cookey in response to question from [facility], about approval to hold Plavix, prior to endoscopic procedure 08/03/13.  Dr. Arbie Cookey advised it is okay, from Vascular Surgery standpoint, to hold Plavix 1 week prior to the endoscopic procedure.  Will forward this recommendation to Dr. Lauro Franklin nurse.

## 2013-07-12 ENCOUNTER — Ambulatory Visit (INDEPENDENT_AMBULATORY_CARE_PROVIDER_SITE_OTHER): Payer: Medicare Other | Admitting: Physician Assistant

## 2013-07-12 ENCOUNTER — Encounter: Payer: Self-pay | Admitting: Physician Assistant

## 2013-07-12 VITALS — BP 130/70 | HR 73 | Temp 98.9°F | Resp 16 | Ht 74.0 in | Wt 226.2 lb

## 2013-07-12 DIAGNOSIS — J069 Acute upper respiratory infection, unspecified: Secondary | ICD-10-CM

## 2013-07-12 NOTE — Assessment & Plan Note (Signed)
Flu swab negative. Increase fluid intake. Rest. Saline nasal spray. Mucinex. Probiotic. Humidifier in bedroom. Tylenol if fever returns. Please call or return to clinic if symptoms not improving.

## 2013-07-12 NOTE — Progress Notes (Signed)
Pre visit review using our clinic review tool, if applicable. No additional management support is needed unless otherwise documented below in the visit note/SLS  

## 2013-07-12 NOTE — Progress Notes (Signed)
Patient ID: Tanner [name], male   DOB: [birth year], 78 y.o.   MRN: [id number]  Patient presents to clinic today complaining of 2 days of head congestion, sinus pressure, nonproductive cough, chills, transient fever and myalgias. Patient states that symptom onset was sudden. Denies recent travel, sick contact. Patient denies history of asthma or allergy. Denies shortness of breath or wheezing. Patient has had his flu shot this year.  Past Medical History  Diagnosis Date  . Allergy     seasonal- mostly spring  . TOBACCO ABUSE, HX OF 03/17/2010  . Overweight(278.02) 07/01/2010  . Personal history of other infectious and parasitic disease 03/17/2010  . Mixed hyperlipidemia 03/17/2010  . HYPERKALEMIA 05/27/2010  . FATIGUE 03/17/2010  . CHICKENPOX, HX OF 03/17/2010  . CAROTID ARTERY OCCLUSION, WITH INFARCTION 03/17/2010  . ASTHMA, CHILDHOOD 03/17/2010  . Hearing loss 11/04/2010    bilateral  . Anemia 02/03/2011  . Hypertension   . Tubular adenoma of colon 06/10/2011  . Hypothyroid 12/08/2011  . BCC (basal cell carcinoma) 12/08/2011  . Thrombocytopenia 04/05/2012  . Sleep apnea   . 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 aspirin EC 81 MG tablet Take 81 mg by mouth daily.      [name] b complex vitamins tablet Take 1 tablet by mouth daily.      . chlorthalidone (HYGROTON) 25 MG tablet Take 1 tablet (25 mg total) by mouth daily.  90 tablet  1  . Cholecalciferol (VITAMIN D PO) Take 2,000 mg by mouth daily.       . clopidogrel (PLAVIX) 75 MG tablet Take 1 tablet (75 mg total) by mouth daily. With a meal  Generic please  90 tablet  1  . Coenzyme Q10 (CO Q-10) 300 MG CAPS Take 1 capsule by mouth daily.      . hydrocortisone 2.5 % cream Apply 2.5 application topically as needed.      [name] levothyroxine (SYNTHROID, LEVOTHROID) 50 MCG tablet Take 1 tablet (50 mcg total) by mouth  daily before breakfast.  90 tablet  1  . NON FORMULARY Take 1 tablet by mouth daily. Study Drug: Pioglitazone, and a placebo.  Take one tablet by mouth daily.      [name] OVER THE COUNTER MEDICATION Take 1 capsule by mouth daily. Mega red 500 mg once daily      . pantoprazole (PROTONIX) 40 MG tablet Take 1 tablet (40 mg total) by mouth daily.  30 tablet  1  . ramipril (ALTACE) 10 MG capsule Take 1 capsule (10 mg total) by mouth 2 (two) times daily.  180 capsule  1  . rosuvastatin (CRESTOR) 40 MG tablet Take 1 tablet (40 mg total) by mouth daily.  90 tablet  1  . MOVIPREP 100 G SOLR Use per prep instruction  1 kit  0  . Probiotic Product (PROBIOTIC PO) Take 1 capsule by mouth daily.        No current facility-administered medications on file prior to visit.    No Known Allergies  Family History  Problem Relation Age of Onset  . Alzheimer's disease Mother   . Emphysema Sister     cigarettes  . Heart disease Son   . Coronary artery disease Brother   . Heart attack Brother   . Other Brother     heart problems- from fathers side  . Cancer Sister  gyn  . Bipolar disorder Daughter   . Obesity Daughter   . Heart disease Sister     History   Social History  . Marital Status: Divorced    Spouse Name: N/A    Number of Children: 3  . Years of Education: 49yrs   Occupational History  . retired    Social History Main Topics  . Smoking status: Former Smoker -- 2.00 packs/day for 20 years    Types: Cigarettes    Quit date: 07/19/1977  . Smokeless tobacco: Never Used  . Alcohol Use: 4.2 oz/week    7 Glasses of wine per week  . Drug Use: No  . Sexual Activity: No   Other Topics Concern  . None   Social History Narrative   Pt lives at home alone.    Caffeine Use: very little   Review of Systems - See HPI.  All other ROS are negative.  Filed Vitals:   07/12/13 1017  BP: 130/70  Pulse: 73  Temp: 98.9 F (37.2 C)  Resp: 16   Physical Exam  Vitals  reviewed. Constitutional: He is oriented to person, place, and time and well-developed, well-nourished, and in no distress.  HENT:  Head: Normocephalic and atraumatic.  Right Ear: External ear normal.  Left Ear: External ear normal.  Nose: Nose normal.  Mouth/Throat: Oropharynx is clear and moist. No oropharyngeal exudate.  Tympanic membranes within normal limits bilaterally. No tenderness to percussion of sinuses noted on exam.  Eyes: Conjunctivae and EOM are normal. Pupils are equal, round, and reactive to light.  Neck: Neck supple.  Cardiovascular: Normal rate, regular rhythm, normal heart sounds and intact distal pulses.   Pulmonary/Chest: Effort normal and breath sounds normal. No respiratory distress. He has no wheezes. He has no rales. He exhibits no tenderness.  Lymphadenopathy:    He has no cervical adenopathy.  Neurological: He is alert and oriented to person, place, and time.  Skin: Skin is warm and dry. No rash noted.  Psychiatric: Affect normal.    Recent Results (from the past 2160 hour(s))  CBC     Status: Abnormal   Collection Time    05/09/13  9:11 AM      Result Value Range   WBC 4.5  4.0 - 10.5 K/uL   RBC 4.26  4.22 - 5.81 MIL/uL   Hemoglobin 13.2  13.0 - 17.0 g/dL   HCT 65.7  84.6 - 96.2 %   MCV 92.5  78.0 - 100.0 fL   MCH 31.0  26.0 - 34.0 pg   MCHC 33.5  30.0 - 36.0 g/dL   RDW 95.2 (*) 84.1 - 32.4 %   Platelets 141 (*) 150 - 400 K/uL  TSH     Status: None   Collection Time    05/09/13  9:11 AM      Result Value Range   TSH 0.523  0.350 - 4.500 uIU/mL  FECAL OCCULT BLOOD, IMMUNOCHEMICAL     Status: None   Collection Time    05/16/13  4:27 PM      Result Value Range   Fecal Occult Bld Positive  Negative  FECAL OCCULT BLOOD, IMMUNOCHEMICAL     Status: None   Collection Time    06/21/13  4:48 PM      Result Value Range   Fecal Occult Bld Positive  Negative  CBC     Status: Abnormal   Collection Time    06/29/13 12:15 PM      Result  Value Range    WBC 5.8  4.5 - 10.5 K/uL   RBC 4.31  4.22 - 5.81 Mil/uL   Platelets 174.0  150.0 - 400.0 K/uL   Hemoglobin 13.6  13.0 - 17.0 g/dL   HCT 40.9  81.1 - 91.4 %   MCV 93.4  78.0 - 100.0 fl   MCHC 33.9  30.0 - 36.0 g/dL   RDW 78.2 (*) 95.6 - 21.3 %  FERRITIN     Status: None   Collection Time    06/29/13 12:15 PM      Result Value Range   Ferritin 31.5  22.0 - 322.0 ng/mL    Assessment/Plan: Viral URI with cough Flu swab negative. Increase fluid intake. Rest. Saline nasal spray. Mucinex. Probiotic. Humidifier in bedroom. Tylenol if fever returns. Please call or return to clinic if symptoms not improving.

## 2013-07-12 NOTE — Patient Instructions (Signed)
Please increase fluid intake.  Rest.  Mucinex.  Saline Nasal Spray.  Humidifier in Bedroom. Tylenol for headache and muscle aches.  Call if symptoms not improving over the next 48 hours.

## 2013-07-20 HISTORY — PX: UPPER GI ENDOSCOPY: SHX6162

## 2013-08-03 ENCOUNTER — Ambulatory Visit (AMBULATORY_SURGERY_CENTER): Payer: Medicare Other | Admitting: Internal Medicine

## 2013-08-03 ENCOUNTER — Encounter: Payer: Self-pay | Admitting: Internal Medicine

## 2013-08-03 ENCOUNTER — Encounter: Payer: Self-pay | Admitting: Family Medicine

## 2013-08-03 VITALS — BP 135/73 | HR 56 | Temp 96.7°F | Resp 17 | Ht 74.0 in | Wt 227.0 lb

## 2013-08-03 DIAGNOSIS — K2970 Gastritis, unspecified, without bleeding: Secondary | ICD-10-CM

## 2013-08-03 DIAGNOSIS — K2990 Gastroduodenitis, unspecified, without bleeding: Secondary | ICD-10-CM

## 2013-08-03 DIAGNOSIS — Z86010 Personal history of colonic polyps: Secondary | ICD-10-CM

## 2013-08-03 DIAGNOSIS — R195 Other fecal abnormalities: Secondary | ICD-10-CM

## 2013-08-03 DIAGNOSIS — D126 Benign neoplasm of colon, unspecified: Secondary | ICD-10-CM

## 2013-08-03 MED ORDER — SODIUM CHLORIDE 0.9 % IV SOLN: 500.0000 mL | INTRAVENOUS | Status: DC

## 2013-08-03 NOTE — Progress Notes (Signed)
Called to room to assist during endoscopic procedure.  Patient ID and intended procedure confirmed with present staff. Received instructions for my participation in the procedure from the performing physician.  

## 2013-08-03 NOTE — Op Note (Signed)
[place]  [address] [place], [address]   COLONOSCOPY PROCEDURE REPORT  PATIENT: Tanner [name], Tanner [name].  MR#: [id number] BIRTHDATE: [birth year] , 78  yrs. old GENDER: Male ENDOSCOPIST: Jerene Bears, MD PROCEDURE DATE:  08/03/2013 PROCEDURE:   Colonoscopy with snare polypectomy and Colonoscopy with cold biopsy polypectomy First Screening Colonoscopy - Avg.  risk and is 50 yrs.  old or older - No.  Prior Negative Screening - Now for repeat screening. N/A  History of Adenoma - Now for follow-up colonoscopy & has been > or = to 3 yrs.  N/A  Polyps Removed Today? Yes. ASA CLASS:   Class III INDICATIONS:Patient's personal history of adenomatous colon polyps and Last colonoscopy performed August 2012. MEDICATIONS: MAC sedation, administered by CRNA and propofol (Diprivan) 350mg  IV  DESCRIPTION OF PROCEDURE:   After the risks benefits and alternatives of the procedure were thoroughly explained, informed consent was obtained.  A digital rectal exam revealed no rectal mass.   The LB [id number]  endoscope was introduced through the anus and advanced to the terminal ileum which was intubated for a short distance. No adverse events experienced.   The quality of the prep was Moviprep fair  The instrument was then slowly withdrawn as the colon was fully examined.   COLON FINDINGS: The mucosa appeared normal in the terminal ileum. Two sessile polyps ranging between 3-17mm in size were found in the ascending colon.  Polypectomy was performed with cold forceps and using cold snare.  All resections were complete and all polyp tissue was completely retrieved.   Moderate diverticulosis was noted in the descending colon and sigmoid colon.  Retroflexed views revealed small external hemorrhoids. The time to cecum=4 minutes 26 seconds.  Withdrawal time=15 minutes 46 seconds.  The scope was withdrawn and the procedure completed. COMPLICATIONS: There were no  complications.  ENDOSCOPIC IMPRESSION: 1.   Normal mucosa in the terminal ileum 2.   Two sessile polyps ranging between 3-53mm in size were found in the ascending colon; Polypectomy was performed with cold forceps and using cold snare 3.   Moderate diverticulosis was noted in the descending colon and sigmoid colon  RECOMMENDATIONS: 1.  Await pathology results 2.  High fiber diet 3.  Consider repeat Colonoscopy in 5 years. 4.  You will receive a letter within 1-2 weeks with the results of your biopsy as well as final recommendations.  Please call my office if you have not received a letter after 3 weeks.   eSigned:  Jerene Bears, MD 08/03/2013 3:04 PM cc: The Patient and Willette Alma, MD

## 2013-08-03 NOTE — Op Note (Signed)
[place]  [address] [place], [address]   ENDOSCOPY PROCEDURE REPORT  PATIENT: [name], Hard.  MR#: [id number] BIRTHDATE: Nov 02, 1934 , 78  yrs. old GENDER: Male ENDOSCOPIST: Jerene Bears, MD REFERRED BY:  Willette Alma, M.D. PROCEDURE DATE:  08/03/2013 PROCEDURE:  EGD w/ biopsy ASA CLASS:     Class III INDICATIONS:  Heme positive stool. MEDICATIONS: MAC sedation, administered by CRNA and propofol (Diprivan) 350mg  IV   (combined procedure) TOPICAL ANESTHETIC: Cetacaine Spray  DESCRIPTION OF PROCEDURE: After the risks benefits and alternatives of the procedure were thoroughly explained, informed consent was obtained.  The LB [id number] endoscope was introduced through the mouth and advanced to the second portion of the duodenum. Without limitations.  The instrument was slowly withdrawn as the mucosa was fully examined.     ESOPHAGUS: There was LA Class A reflux esophagitis noted.  Z-line regular.   The esophagus was otherwise normal.  STOMACH: Mild erosive gastritis (inflammation) was found in the gastric body and gastric antrum.  There was adherent heme present with erosions.  Multiple random biopsies were performed using cold forceps.  Sample sent for histology.   Small  submucosal nodule was found on the lesser curvature of the gastric body.  DUODENUM: The duodenal mucosa showed no abnormalities in the bulb and second portion of the duodenum.  Retroflexed views revealed no abnormalities.     The scope was then withdrawn from the patient and the procedure completed.  COMPLICATIONS: There were no complications. ENDOSCOPIC IMPRESSION: 1.   There was LA Class A reflux esophagitis noted 2.   The esophagus was otherwise normal. 3.   Erosive gastritis (inflammation) was found in the gastric body and gastric antrum; multiple random biopsies 4.   Nodule was found on the lesser curvature of the gastric body 5.   The duodenal mucosa showed no  abnormalities in the bulb and second portion of the duodenum  RECOMMENDATIONS: 1.  Await pathology results 2.  Continue taking your PPI (antiacid medicine) once daily.  It is best to be taken 20-30 minutes prior to breakfast meal. 3.  avoid NSAIDS 4.  Consideration of EUS for further evaluation of gastric nodule  eSigned:  Jerene Bears, MD 08/03/2013 2:59 PM   CC:The Patient and Willette Alma, MD  PATIENT NAME:  Tanner [name]. MR#: [id number]

## 2013-08-03 NOTE — Patient Instructions (Addendum)
Restart your Plavix tomorrow, 08/04/13.  Continue your Protonix daily in the AM 20-30 minutes prior to morning meal.    YOU HAD AN ENDOSCOPIC PROCEDURE TODAY AT [facility]: Refer to the procedure report that was given to you for any specific questions about what was found during the examination.  If the procedure report does not answer your questions, please call your gastroenterologist to clarify.  If you requested that your care partner not be given the details of your procedure findings, then the procedure report has been included in a sealed envelope for you to review at your convenience later.  YOU SHOULD EXPECT: Some feelings of bloating in the abdomen. Passage of more gas than usual.  Walking can help get rid of the air that was put into your GI tract during the procedure and reduce the bloating. If you had a lower endoscopy (such as a colonoscopy or flexible sigmoidoscopy) you may notice spotting of blood in your stool or on the toilet paper. If you underwent a bowel prep for your procedure, then you may not have a normal bowel movement for a few days.  DIET: Your first meal following the procedure should be a light meal and then it is ok to progress to your normal diet.  A half-sandwich or bowl of soup is an example of a good first meal.  Heavy or fried foods are harder to digest and may make you feel nauseous or bloated.  Likewise meals heavy in dairy and vegetables can cause extra gas to form and this can also increase the bloating.  Drink plenty of fluids but you should avoid alcoholic beverages for 24 hours.  ACTIVITY: Your care partner should take you home directly after the procedure.  You should plan to take it easy, moving slowly for the rest of the day.  You can resume normal activity the day after the procedure however you should NOT DRIVE or use heavy machinery for 24 hours (because of the sedation medicines used during the test).    SYMPTOMS TO REPORT  IMMEDIATELY: A gastroenterologist can be reached at any hour.  During normal business hours, 8:30 AM to 5:00 PM Monday through Friday, call [phone].  After hours and on weekends, please call the GI answering service at [phone] who will take a message and have the physician on call contact you.   Following lower endoscopy (colonoscopy or flexible sigmoidoscopy):  Excessive amounts of blood in the stool  Significant tenderness or worsening of abdominal pains  Swelling of the abdomen that is new, acute  Fever of 100F or higher  Following upper endoscopy (EGD)  Vomiting of blood or coffee ground material  New chest pain or pain under the shoulder blades  Painful or persistently difficult swallowing  New shortness of breath  Fever of 100F or higher  Black, tarry-looking stools  FOLLOW UP: If any biopsies were taken you will be contacted by phone or by letter within the next 1-3 weeks.  Call your gastroenterologist if you have not heard about the biopsies in 3 weeks.  Our staff will call the home number listed on your records the next business day following your procedure to check on you and address any questions or concerns that you may have at that time regarding the information given to you following your procedure. This is a courtesy call and so if there is no answer at the home number and we have not heard from you through the emergency physician  on call, we will assume that you have returned to your regular daily activities without incident.  SIGNATURES/CONFIDENTIALITY: You and/or your care partner have signed paperwork which will be entered into your electronic medical record.  These signatures attest to the fact that that the information above on your After Visit Summary has been reviewed and is understood.  Full responsibility of the confidentiality of this discharge information lies with you and/or your care-partner.

## 2013-08-04 ENCOUNTER — Telehealth: Payer: Self-pay | Admitting: *Deleted

## 2013-08-04 NOTE — Telephone Encounter (Signed)
Lm on number given in admitting yesterday that identifies pt by first and last name to return call if problems. ewm

## 2013-08-05 ENCOUNTER — Other Ambulatory Visit: Payer: Self-pay | Admitting: Family Medicine

## 2013-08-05 DIAGNOSIS — K921 Melena: Secondary | ICD-10-CM

## 2013-08-05 MED ORDER — PANTOPRAZOLE SODIUM 40 MG PO TBEC: 40.0000 mg | DELAYED_RELEASE_TABLET | Freq: Every day | ORAL | Status: DC

## 2013-08-07 ENCOUNTER — Other Ambulatory Visit: Payer: Self-pay | Admitting: Dermatology

## 2013-08-07 ENCOUNTER — Telehealth: Payer: Self-pay

## 2013-08-07 DIAGNOSIS — R933 Abnormal findings on diagnostic imaging of other parts of digestive tract: Secondary | ICD-10-CM

## 2013-08-07 NOTE — Telephone Encounter (Signed)
Message copied by Barron Alvine on Mon Aug 07, 2013 10:48 AM ------      Message from: Owens Loffler P      Created: Mon Aug 07, 2013 10:15 AM       Ulice Dash,      Interesting pictures.  I've seen edge of liver look similarly before.            We'll get him scheduled and I'll let you know what I find.            Thanks            Abir Eroh,      He needs upper EUS, radial +/- linear, ++MAC, diagnoses abnormal stomach, ?mass in stomach, next avail EUS Thursday.            Thanks            ----- Message -----         From: Jerene Bears, MD         Sent: 08/04/2013   1:17 PM           To: Milus Banister, MD            Dan      Could you do an EUS +/- FNA on this pt.      He had a lesser curve, gastric body, submucosal lesion seen at EGD yesterday      Done for heme + stool, had gastritis      It is visible on my pics from EGD, and more firm than I would expect for lipoma.      ?GIST      Thanks      Ulice Dash             ------

## 2013-08-08 ENCOUNTER — Other Ambulatory Visit: Payer: Self-pay

## 2013-08-08 DIAGNOSIS — R933 Abnormal findings on diagnostic imaging of other parts of digestive tract: Secondary | ICD-10-CM

## 2013-08-08 NOTE — Telephone Encounter (Signed)
Dr Ardis Hughs pt is on Plavix, should he hold?

## 2013-08-08 NOTE — Telephone Encounter (Signed)
Pt has been scheduled for EUS on 08/10/13 1 pm need to instruct, review meds and allergies

## 2013-08-09 ENCOUNTER — Encounter: Payer: Self-pay | Admitting: Internal Medicine

## 2013-08-09 ENCOUNTER — Encounter (HOSPITAL_COMMUNITY): Payer: Self-pay | Admitting: Pharmacy Technician

## 2013-08-09 ENCOUNTER — Encounter: Payer: Self-pay | Admitting: Gastroenterology

## 2013-08-09 ENCOUNTER — Encounter (HOSPITAL_COMMUNITY): Payer: Self-pay | Admitting: *Deleted

## 2013-08-09 NOTE — Telephone Encounter (Signed)
Pt has been instructed and meds reviewed letter has been sent regarding plavix.  Pt request to have instructions in my chart no need to mail.

## 2013-08-09 NOTE — Telephone Encounter (Signed)
Yes, will need to be held for 5 days prior.  Will need OK from his prescribing MD.  Will need to cancel tomorrows apt unless he has already been holding hte med.  Can likely fit in next week.

## 2013-08-09 NOTE — Telephone Encounter (Signed)
Pt has been moved to 08/17/13 1030 am letter to be sent to pre scriber of plavix

## 2013-08-09 NOTE — Telephone Encounter (Signed)
Dr Donnetta Hutching is the contact for plavix hold.  EUS scheduled, pt instructed and medications reviewed.  Patient instructions mailed to home.  Patient to call with any questions or concerns.

## 2013-08-10 ENCOUNTER — Telehealth: Payer: Self-pay | Admitting: Family Medicine

## 2013-08-10 DIAGNOSIS — I10 Essential (primary) hypertension: Secondary | ICD-10-CM

## 2013-08-10 DIAGNOSIS — E782 Mixed hyperlipidemia: Secondary | ICD-10-CM

## 2013-08-10 DIAGNOSIS — D649 Anemia, unspecified: Secondary | ICD-10-CM

## 2013-08-10 NOTE — Telephone Encounter (Signed)
Requesting all lab work be done here in [facility] office, not at [facility].

## 2013-08-11 ENCOUNTER — Telehealth: Payer: Self-pay

## 2013-08-11 ENCOUNTER — Telehealth: Payer: Self-pay | Admitting: Gastroenterology

## 2013-08-11 NOTE — Telephone Encounter (Signed)
Message copied by Barron Alvine on Fri Aug 11, 2013  2:13 PM ------      Message from: Sk[place]man, [place] K      Created: Fri Aug 11, 2013  1:47 PM       According to Dr. Darlin Priestly message of 07-07-13, Mr. Errico can be off his Plavix 5 days prior to any endoscopic procedures that are performed by his gastroenterologist.  ------

## 2013-08-11 NOTE — Telephone Encounter (Signed)
See alternate note  

## 2013-08-11 NOTE — Telephone Encounter (Signed)
Verbal ok to hold plavix 5 days prior to procedure, note in EPIC.  Left message for pt to return call

## 2013-08-11 NOTE — Telephone Encounter (Signed)
Please call pt Tanner [name] if they do not call back

## 2013-08-11 NOTE — Telephone Encounter (Signed)
Message copied by Barron Alvine on Fri Aug 11, 2013  9:07 AM ------      Message from: Barron Alvine      Created: Wed Aug 09, 2013 10:51 AM       Waiting on anti coag 08/17/13 EUS Dr Donnetta Hutching  ------

## 2013-08-11 NOTE — Telephone Encounter (Signed)
Patient has been advised that per Dr Donnetta Hutching, he may hold his Plavix for 5 days prior to his procedure. He verbalizes understanding of this.

## 2013-08-11 NOTE — Telephone Encounter (Signed)
See alternate note Left message on machine to call back

## 2013-08-11 NOTE — Telephone Encounter (Signed)
I spoke with Dr Luther Parody nurse and she will get an answer for me

## 2013-08-14 ENCOUNTER — Encounter: Payer: Self-pay | Admitting: Family Medicine

## 2013-08-14 ENCOUNTER — Ambulatory Visit (INDEPENDENT_AMBULATORY_CARE_PROVIDER_SITE_OTHER): Payer: Medicare Other | Admitting: Family Medicine

## 2013-08-14 VITALS — BP 138/76 | HR 57 | Temp 97.4°F | Resp 16 | Wt 225.1 lb

## 2013-08-14 DIAGNOSIS — I83819 Varicose veins of unspecified lower extremities with pain: Secondary | ICD-10-CM

## 2013-08-14 DIAGNOSIS — I83893 Varicose veins of bilateral lower extremities with other complications: Secondary | ICD-10-CM

## 2013-08-14 NOTE — Assessment & Plan Note (Signed)
With patient's foot we did make modifications putting a heel lift into the left shoe which would decrease been negative heel lifts. Patient also given a first ray post on the edge. Patient will wear this to try to decrease the amount of strain on the first toe. Discussed topical anti-inflammatories that could be helpful in patient will continue the over-the-counter medicines as seem to be helpful. Patient continues to have pain he will come back and we'll do an ultrasound of the first toe and consider injection if necessary. Otherwise patient in followup on an as-needed basis.

## 2013-08-14 NOTE — Telephone Encounter (Signed)
Orders entered per 05/09/13 office note as below:  Labs prior to visit lipid, renal, cbc, tsh, hepatic, TIBC and iron level

## 2013-08-14 NOTE — Patient Instructions (Signed)
Good to see you Try the heel lift and tell me how it goes.  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  Try arnica topically on the toe Ice on the toe 20 minutes at the end of the day can help Keep doing what you are doing.  See me when you need me.

## 2013-08-14 NOTE — Progress Notes (Signed)
  CC: Foot pain, left follow up  HPI: Patient is a 79-year-old gentleman who is coming in with mild plantar fasciitis as well as was found to be a varicose vein. Patient was fitted with custom orthotics and states that he had almost near resolution of the arch pain he was having. Patient does have a large toe pain. Patient states that the foot pain can come and go but has not noticed any significant reproducible events. Patient has changed shoes recently and noticed that the pain is only when he wears these shoes on the left side. Otherwise patient is able to do all activities of daily living without any significant discomfort. Patient was pain free and would like to remain pain free if possible.   Past medical, surgical, family and social history reviewed. Medications reviewed all in the electronic medical record.   Review of Systems: No headache, visual changes, nausea, vomiting, diarrhea, constipation, dizziness, abdominal pain, skin rash, fevers, chills, night sweats, weight loss, swollen lymph nodes, body aches, joint swelling, muscle aches, chest pain, shortness of breath, mood changes.   Objective:    Blood pressure 138/76, pulse 57, temperature 97.4 F (36.3 C), temperature source Oral, resp. rate 16, weight 225 lb 1.3 oz (102.096 kg), SpO2 96.00%.   General: No apparent distress alert and oriented x3 mood and affect normal, dressed appropriately.  HEENT: Pupils equal, extraocular movements intact Respiratory: Patient's speak in full sentences and does not appear short of breath Cardiovascular: No lower extremity edema, non tender, no erythema Skin: Warm dry intact with no signs of infection or rash on extremities or on axial skeleton. Abdomen: Soft nontender Neuro: Cranial nerves II through XII are intact, neurovascularly intact in all extremities with 2+ DTRs and 2+ pulses. Lymph: No lymphadenopathy of posterior or anterior cervical chain or axillae bilaterally.  Gait normal with good  balance and coordination.  MSK: Non tender with full range of motion and good stability and symmetric strength and tone of shoulders, elbows, wrist, hip, knee and ankles bilaterally.  Foot exam: On inspection patient's left foot does have some mild breakdown of the longitudinal arch medially as well as the transverse arch causing a splaying between the first and second toes. Patient is having hammering of the second and third toes on his left foot. He is nontender exam. He is neurovascularly intact distally. Patient's first toe of the left foot does show some hallux rigidus, no significant discomfort from previous exam.   Patient shoes that he is wearing has a negative heel which I think is causing stretching of the plantar fascia.   Impression and Recommendations:       

## 2013-08-15 ENCOUNTER — Other Ambulatory Visit: Payer: Medicare Other

## 2013-08-15 LAB — IRON AND TIBC
%SAT: 16 % — ABNORMAL LOW (ref 20–55)
IRON: 62 ug/dL (ref 42–165)
TIBC: 384 ug/dL (ref 215–435)
UIBC: 322 ug/dL (ref 125–400)

## 2013-08-15 LAB — CBC
HCT: 36.7 % — ABNORMAL LOW (ref 39.0–52.0)
Hemoglobin: 11.9 g/dL — ABNORMAL LOW (ref 13.0–17.0)
MCH: 31.1 pg (ref 26.0–34.0)
MCHC: 32.4 g/dL (ref 30.0–36.0)
MCV: 95.8 fL (ref 78.0–100.0)
Platelets: 140 10*3/uL — ABNORMAL LOW (ref 150–400)
RBC: 3.83 MIL/uL — ABNORMAL LOW (ref 4.22–5.81)
RDW: 15.6 % — ABNORMAL HIGH (ref 11.5–15.5)
WBC: 5.2 10*3/uL (ref 4.0–10.5)

## 2013-08-15 LAB — RENAL FUNCTION PANEL
Albumin: 4.4 g/dL (ref 3.5–5.2)
BUN: 24 mg/dL — AB (ref 6–23)
CALCIUM: 9.4 mg/dL (ref 8.4–10.5)
CO2: 30 mEq/L (ref 19–32)
CREATININE: 1.03 mg/dL (ref 0.50–1.35)
Chloride: 102 mEq/L (ref 96–112)
Glucose, Bld: 95 mg/dL (ref 70–99)
PHOSPHORUS: 3.5 mg/dL (ref 2.3–4.6)
Potassium: 4.8 mEq/L (ref 3.5–5.3)
Sodium: 139 mEq/L (ref 135–145)

## 2013-08-15 LAB — LIPID PANEL
CHOL/HDL RATIO: 3.1 ratio
Cholesterol: 146 mg/dL (ref 0–200)
HDL: 47 mg/dL (ref >39)
LDL CALC: 82 mg/dL (ref 0–99)
Triglycerides: 85 mg/dL (ref <150)
VLDL: 17 mg/dL (ref 0–40)

## 2013-08-15 LAB — HEPATIC FUNCTION PANEL
ALT: 13 U/L (ref 0–53)
AST: 20 U/L (ref 0–37)
Albumin: 4.4 g/dL (ref 3.5–5.2)
Alkaline Phosphatase: 57 U/L (ref 39–117)
BILIRUBIN DIRECT: 0.1 mg/dL (ref 0.0–0.3)
BILIRUBIN INDIRECT: 0.4 mg/dL (ref 0.0–0.9)
BILIRUBIN TOTAL: 0.5 mg/dL (ref 0.3–1.2)
Total Protein: 6.7 g/dL (ref 6.0–8.3)

## 2013-08-15 LAB — TSH: TSH: 11.298 u[IU]/mL — ABNORMAL HIGH (ref 0.350–4.500)

## 2013-08-15 NOTE — Addendum Note (Signed)
Addended by: Varney Daily on: 08/15/2013 03:46 PM   Modules accepted: Orders

## 2013-08-17 ENCOUNTER — Encounter (HOSPITAL_COMMUNITY)
Admission: RE | Disposition: A | Discharge status: Home / Self Care | Payer: Self-pay | Source: Ambulatory Visit | Attending: Gastroenterology

## 2013-08-17 ENCOUNTER — Other Ambulatory Visit: Payer: Self-pay | Admitting: Vascular Surgery

## 2013-08-17 ENCOUNTER — Ambulatory Visit (HOSPITAL_COMMUNITY): Payer: Medicare Other | Admitting: Anesthesiology

## 2013-08-17 ENCOUNTER — Ambulatory Visit (HOSPITAL_COMMUNITY)
Admission: RE | Admit: 2013-08-17 | Discharge: 2013-08-17 | Disposition: A | Discharge status: Home / Self Care | Payer: Medicare Other | Source: Ambulatory Visit | Attending: Gastroenterology | Admitting: Gastroenterology

## 2013-08-17 ENCOUNTER — Encounter (HOSPITAL_COMMUNITY): Payer: Self-pay

## 2013-08-17 ENCOUNTER — Encounter (HOSPITAL_COMMUNITY): Payer: Medicare Other | Admitting: Anesthesiology

## 2013-08-17 DIAGNOSIS — I6529 Occlusion and stenosis of unspecified carotid artery: Secondary | ICD-10-CM

## 2013-08-17 DIAGNOSIS — M722 Plantar fascial fibromatosis: Secondary | ICD-10-CM | POA: Insufficient documentation

## 2013-08-17 DIAGNOSIS — K2970 Gastritis, unspecified, without bleeding: Secondary | ICD-10-CM | POA: Insufficient documentation

## 2013-08-17 DIAGNOSIS — I8390 Asymptomatic varicose veins of unspecified lower extremity: Secondary | ICD-10-CM | POA: Insufficient documentation

## 2013-08-17 DIAGNOSIS — Z48812 Encounter for surgical aftercare following surgery on the circulatory system: Secondary | ICD-10-CM

## 2013-08-17 DIAGNOSIS — K2990 Gastroduodenitis, unspecified, without bleeding: Principal | ICD-10-CM

## 2013-08-17 DIAGNOSIS — M2020 Hallux rigidus, unspecified foot: Secondary | ICD-10-CM | POA: Insufficient documentation

## 2013-08-17 DIAGNOSIS — R933 Abnormal findings on diagnostic imaging of other parts of digestive tract: Secondary | ICD-10-CM

## 2013-08-17 HISTORY — PX: EUS: SHX5427

## 2013-08-17 SURGERY — UPPER ENDOSCOPIC ULTRASOUND (EUS) LINEAR
Anesthesia: Monitor Anesthesia Care

## 2013-08-17 MED ORDER — KETAMINE HCL 10 MG/ML IJ SOLN
INTRAMUSCULAR | Status: DC | PRN
  Administered 2013-08-17: 20 mg via INTRAVENOUS

## 2013-08-17 MED ORDER — SODIUM CHLORIDE 0.9 % IV SOLN: INTRAVENOUS | Status: DC

## 2013-08-17 MED ORDER — LIDOCAINE HCL (CARDIAC) 20 MG/ML IV SOLN
INTRAVENOUS | Status: DC | PRN
  Administered 2013-08-17: 100 mg via INTRAVENOUS

## 2013-08-17 MED ORDER — MIDAZOLAM HCL 5 MG/5ML IJ SOLN
INTRAMUSCULAR | Status: DC | PRN
  Administered 2013-08-17: 2 mg via INTRAVENOUS

## 2013-08-17 MED ORDER — LACTATED RINGERS IV SOLN
INTRAVENOUS | Status: DC | PRN
  Administered 2013-08-17: 10:00:00 via INTRAVENOUS

## 2013-08-17 MED ORDER — LIDOCAINE HCL (CARDIAC) 20 MG/ML IV SOLN
INTRAVENOUS | Status: AC
  Filled 2013-08-17: qty 5

## 2013-08-17 MED ORDER — PROPOFOL INFUSION 10 MG/ML OPTIME
INTRAVENOUS | Status: DC | PRN
  Administered 2013-08-17: 160 ug/kg/min via INTRAVENOUS

## 2013-08-17 MED ORDER — ONDANSETRON HCL 4 MG/2ML IJ SOLN
INTRAMUSCULAR | Status: DC | PRN
  Administered 2013-08-17: 4 mg via INTRAVENOUS

## 2013-08-17 MED ORDER — BUTAMBEN-TETRACAINE-BENZOCAINE 2-2-14 % EX AERO
INHALATION_SPRAY | CUTANEOUS | Status: DC | PRN
  Administered 2013-08-17: 2 via TOPICAL

## 2013-08-17 MED ORDER — MIDAZOLAM HCL 2 MG/2ML IJ SOLN
INTRAMUSCULAR | Status: AC
  Filled 2013-08-17: qty 2

## 2013-08-17 MED ORDER — ONDANSETRON HCL 4 MG/2ML IJ SOLN
INTRAMUSCULAR | Status: AC
  Filled 2013-08-17: qty 2

## 2013-08-17 NOTE — H&P (View-Only) (Signed)
  CC: Foot pain, left follow up  HPI: Patient is a 79 year old gentleman who is coming in with mild plantar fasciitis as well as was found to be a varicose vein. Patient was fitted with custom orthotics and states that he had almost near resolution of the arch pain he was having. Patient does have a large toe pain. Patient states that the foot pain can come and go but has not noticed any significant reproducible events. Patient has changed shoes recently and noticed that the pain is only when he wears these shoes on the left side. Otherwise patient is able to do all activities of daily living without any significant discomfort. Patient was pain free and would like to remain pain free if possible.   Past medical, surgical, family and social history reviewed. Medications reviewed all in the electronic medical record.   Review of Systems: No headache, visual changes, nausea, vomiting, diarrhea, constipation, dizziness, abdominal pain, skin rash, fevers, chills, night sweats, weight loss, swollen lymph nodes, body aches, joint swelling, muscle aches, chest pain, shortness of breath, mood changes.   Objective:    Blood pressure 138/76, pulse 57, temperature 97.4 F (36.3 C), temperature source Oral, resp. rate 16, weight 225 lb 1.3 oz (102.096 kg), SpO2 96.00%.   General: No apparent distress alert and oriented x3 mood and affect normal, dressed appropriately.  HEENT: Pupils equal, extraocular movements intact Respiratory: Patient's speak in full sentences and does not appear short of breath Cardiovascular: No lower extremity edema, non tender, no erythema Skin: Warm dry intact with no signs of infection or rash on extremities or on axial skeleton. Abdomen: Soft nontender Neuro: Cranial nerves II through XII are intact, neurovascularly intact in all extremities with 2+ DTRs and 2+ pulses. Lymph: No lymphadenopathy of posterior or anterior cervical chain or axillae bilaterally.  Gait normal with good  balance and coordination.  MSK: Non tender with full range of motion and good stability and symmetric strength and tone of shoulders, elbows, wrist, hip, knee and ankles bilaterally.  Foot exam: On inspection patient's left foot does have some mild breakdown of the longitudinal arch medially as well as the transverse arch causing a splaying between the first and second toes. Patient is having hammering of the second and third toes on his left foot. He is nontender exam. He is neurovascularly intact distally. Patient's first toe of the left foot does show some hallux rigidus, no significant discomfort from previous exam.   Patient shoes that he is wearing has a negative heel which I think is causing stretching of the plantar fascia.   Impression and Recommendations:

## 2013-08-17 NOTE — Preoperative (Signed)
Beta Blockers   Reason not to administer Beta Blockers:Not Applicable 

## 2013-08-17 NOTE — Anesthesia Postprocedure Evaluation (Signed)
  Anesthesia Post-op Note  Patient: Tanner [name]  Procedure(s) Performed: Procedure(s) (LRB): UPPER ENDOSCOPIC ULTRASOUND (EUS) LINEAR (N/A)  Patient Location: PACU  Anesthesia Type: MAC  Level of Consciousness: awake and alert   Airway and Oxygen Therapy: Patient Spontanous Breathing  Post-op Pain: mild  Post-op Assessment: Post-op Vital signs reviewed, Patient's Cardiovascular Status Stable, Respiratory Function Stable, Patent Airway and No signs of Nausea or vomiting  Last Vitals:  Filed Vitals:   08/17/13 1152  BP: 138/67  Pulse: 56  Temp: 36.7 C  Resp: 15    Post-op Vital Signs: stable   Complications: No apparent anesthesia complications

## 2013-08-17 NOTE — Transfer of Care (Signed)
Immediate Anesthesia Transfer of Care Note  Patient: Tanner [name]  Procedure(s) Performed: Procedure(s): UPPER ENDOSCOPIC ULTRASOUND (EUS) LINEAR (N/A)  Patient Location: PACU  Anesthesia Type:MAC  Level of Consciousness: awake, alert  and oriented  Airway & Oxygen Therapy: Patient Spontanous Breathing and Patient connected to face mask oxygen  Post-op Assessment: Report given to PACU RN and Post -op Vital signs reviewed and stable  Post vital signs: Reviewed and stable  Complications: No apparent anesthesia complications

## 2013-08-17 NOTE — Discharge Instructions (Signed)

## 2013-08-17 NOTE — Interval H&P Note (Signed)
History and Physical Interval Note:  08/17/2013 11:09 AM  Tanner [name]  has presented today for surgery, with the diagnosis of Nonspecific (abnormal) findings on radiological and other examination of gastrointestinal tract [793.4]  The various methods of treatment have been discussed with the patient and family. After consideration of risks, benefits and other options for treatment, the patient has consented to  Procedure(s): UPPER ENDOSCOPIC ULTRASOUND (EUS) LINEAR (N/A) as a surgical intervention .  The patient's history has been reviewed, patient examined, no change in status, stable for surgery.  I have reviewed the patient's chart and labs.  Questions were answered to the patient's satisfaction.     Milus Banister

## 2013-08-17 NOTE — Op Note (Signed)
[facility] [place], [address]   ENDOSCOPIC ULTRASOUND PROCEDURE REPORT  PATIENT: Tanner [name], Tanner [name].  MR#: [id number] BIRTHDATE: 04-03-1935  GENDER: Male ENDOSCOPIST: Milus Banister, MD REFERRED BY:  Zenovia Jarred, MD PROCEDURE DATE:  08/17/2013 PROCEDURE:   Upper EUS ASA CLASS:      Class II INDICATIONS:   subepthelial gastric lesion on recent EGD (Dr. Hilarie Fredrickson). MEDICATIONS: MAC sedation, administered by CRNA  DESCRIPTION OF PROCEDURE:   After the risks benefits and alternatives of the procedure were  explained, informed consent was obtained. The patient was then placed in the left, lateral, decubitus postion and IV sedation was administered. Throughout the procedure, the patients blood pressure, pulse and oxygen saturations were monitored continuously.  Under direct visualization, the Pentax Radial EUS [id number]  endoscope was introduced through the mouth  and advanced to the bulb of duodenum .  Water was used as necessary to provide an acoustic interface. Upon completion of the imaging, water was removed and the patient was sent to the recovery room in satisfactory condition.   Endoscopic findings (with both radial echoendoscope and standard adult gastroscope); 1. Mild non-specific gastritis. 2. UGI tract was otherwise normal; I did not appreciate subepithelial bulges, lesions.  EUS findings: 1. Echolayering of wall of stomach was normal.  No discrete masses or lesions. 2. Limited views of pancreas, liver, spleen, portal and splenic vessels were all normal  Impression: The subepithelial 'lesion' noted on EGD recently was not visualized on todays examination with echoendoscope and standard gastroscope. Reviewing the previous images, I suspect that the left lobe liver edge was causing the deformity in the gastric lumen.  He does not need further surveillance testing.  Can resume plavix today.   ______________________________ eSignedMilus Banister, MD 08/17/2013 11:55 AM

## 2013-08-17 NOTE — Anesthesia Preprocedure Evaluation (Signed)
Anesthesia Evaluation  Patient identified by MRN, date of birth, ID band Patient awake    Reviewed: Allergy & Precautions, H&P , NPO status , Patient's Chart, lab work & pertinent test results  Airway Mallampati: II TM Distance: >3 FB Neck ROM: Full    Dental no notable dental hx.    Pulmonary sleep apnea , former smoker,  breath sounds clear to auscultation  Pulmonary exam normal       Cardiovascular hypertension, Pt. on medications + Peripheral Vascular Disease Rhythm:Regular Rate:Normal     Neuro/Psych CVA negative psych ROS   GI/Hepatic negative GI ROS, Neg liver ROS,   Endo/Other  negative endocrine ROS  Renal/GU negative Renal ROS  negative genitourinary   Musculoskeletal negative musculoskeletal ROS (+)   Abdominal   Peds negative pediatric ROS (+)  Hematology  (+) anemia ,   Anesthesia Other Findings   Reproductive/Obstetrics negative OB ROS                           Anesthesia Physical Anesthesia Plan  ASA: III  Anesthesia Plan: MAC   Post-op Pain Management:    Induction: Intravenous  Airway Management Planned: Nasal Cannula  Additional Equipment:   Intra-op Plan:   Post-operative Plan:   Informed Consent: I have reviewed the patients History and Physical, chart, labs and discussed the procedure including the risks, benefits and alternatives for the proposed anesthesia with the patient or authorized representative who has indicated his/her understanding and acceptance.   Dental advisory given  Plan Discussed with: CRNA and Surgeon  Anesthesia Plan Comments:         Anesthesia Quick Evaluation

## 2013-08-18 ENCOUNTER — Encounter (HOSPITAL_COMMUNITY): Payer: Self-pay | Admitting: Gastroenterology

## 2013-08-22 ENCOUNTER — Ambulatory Visit: Payer: Medicare Other | Admitting: Family Medicine

## 2013-09-01 ENCOUNTER — Other Ambulatory Visit: Payer: Self-pay | Admitting: Family Medicine

## 2013-09-01 ENCOUNTER — Telehealth: Payer: Self-pay | Admitting: Family Medicine

## 2013-09-01 NOTE — Telephone Encounter (Signed)
DUPLICATE PHONE NOTE:  pantoprazole (PROTONIX) 40 MG tablet 90 tablet 0 09/01/2013     Sig: TAKE 1 TABLET (40 MG TOTAL) BY MOUTH DAILY.    Notes to Pharmacy: PATIENT REQUESTS 90 DAY SUPPLY. THANKS!    E-Prescribing Status: Receipt confirmed by pharmacy (09/01/2013 11:50 AM EST)

## 2013-09-01 NOTE — Telephone Encounter (Signed)
90 day request  pantoprazole

## 2013-09-01 NOTE — Telephone Encounter (Signed)
Rx request to pharmacy/SLS  

## 2013-09-04 ENCOUNTER — Ambulatory Visit (INDEPENDENT_AMBULATORY_CARE_PROVIDER_SITE_OTHER): Payer: Medicare Other | Admitting: Family Medicine

## 2013-09-04 ENCOUNTER — Encounter: Payer: Self-pay | Admitting: Family Medicine

## 2013-09-04 VITALS — BP 122/76 | HR 56 | Temp 98.7°F | Resp 18 | Ht 74.0 in | Wt 229.0 lb

## 2013-09-04 DIAGNOSIS — I10 Essential (primary) hypertension: Secondary | ICD-10-CM

## 2013-09-04 DIAGNOSIS — D649 Anemia, unspecified: Secondary | ICD-10-CM

## 2013-09-04 DIAGNOSIS — E039 Hypothyroidism, unspecified: Secondary | ICD-10-CM

## 2013-09-04 DIAGNOSIS — I63239 Cerebral infarction due to unspecified occlusion or stenosis of unspecified carotid arteries: Secondary | ICD-10-CM

## 2013-09-04 DIAGNOSIS — R040 Epistaxis: Secondary | ICD-10-CM

## 2013-09-04 DIAGNOSIS — M722 Plantar fascial fibromatosis: Secondary | ICD-10-CM

## 2013-09-04 DIAGNOSIS — I6350 Cerebral infarction due to unspecified occlusion or stenosis of unspecified cerebral artery: Secondary | ICD-10-CM

## 2013-09-04 DIAGNOSIS — E782 Mixed hyperlipidemia: Secondary | ICD-10-CM

## 2013-09-04 NOTE — Patient Instructions (Signed)

## 2013-09-04 NOTE — Progress Notes (Signed)
Pre visit review using our clinic review tool, if applicable. No additional management support is needed unless otherwise documented below in the visit note. 

## 2013-09-04 NOTE — Progress Notes (Signed)
Patient ID: Tanner [name], male   DOB: 1934-10-19, 79 y.o.   MRN: [id number] Tanner [name] [id number] 09/17/1934 09/04/2013      Progress Note-Follow Up  Subjective  Chief Complaint  Chief Complaint  Patient presents with  . Hyperlipidemia    pt is here for f/u.     HPI  Patient is a 79 year old Caucasian male who is in today for routine followup. He generally doing well. He denies any recent illness. Has had a recent upper and lower endoscopy and encouraged to stay on his PPI. Is using MiraLax every other day to keep his bowels moving well. He'll pain has improved with treatment at sports medicine. Has been struggling with some nosebleeds but they are tolerable at the present time and easy to stop. Denies chest pain, palpitations, shortness of breath, GI or GU concerns at this time  Past Medical History  Diagnosis Date  . Allergy     seasonal- mostly spring  . TOBACCO ABUSE, HX OF 03/17/2010  . Overweight 07/01/2010  . Personal history of other infectious and parasitic disease 03/17/2010  . Mixed hyperlipidemia 03/17/2010  . HYPERKALEMIA 05/27/2010  . FATIGUE 03/17/2010  . CHICKENPOX, HX OF 03/17/2010  . CAROTID ARTERY OCCLUSION, WITH INFARCTION 03/17/2010  . Hearing loss 11/04/2010    bilateral  . Anemia 02/03/2011  . Hypertension   . Tubular adenoma of colon 06/10/2011  . Hypothyroid 12/08/2011  . BCC (basal cell carcinoma) 12/08/2011  . Thrombocytopenia 04/05/2012  . ASTHMA, CHILDHOOD 03/17/2010    mostly as child  . Sleep apnea     uses mouthpiece only  . CEREBROVASCULAR ACCIDENT 03/17/2010    partial loss of peripheral vision on left-"slight improved"    Past Surgical History  Procedure Laterality Date  . Tonsillectomy    . Pilonidal cyst excision  1968    removed  . Knee cartliage repair  1964, 1990    bilateral  . Rotator cuff repair      right  . Endarterectomy Left 02/13/2013    Procedure: ENDARTERECTOMY CAROTID;  Surgeon: Rosetta Posner, MD;  Location: [facility];   Service: Vascular;  Laterality: Left;  . Eus N/A 08/17/2013    Procedure: UPPER ENDOSCOPIC ULTRASOUND (EUS) LINEAR;  Surgeon: Milus Banister, MD;  Location: [facility];  Service: Endoscopy;  Laterality: N/A;    Family History  Problem Relation Age of Onset  . Alzheimer's disease Mother   . Emphysema Sister     cigarettes  . Heart disease Son   . Coronary artery disease Brother   . Heart attack Brother   . Other Brother     heart problems- from fathers side  . Cancer Sister     gyn  . Bipolar disorder Daughter   . Obesity Daughter   . Heart disease Sister     History   Social History  . Marital Status: Divorced    Spouse Name: N/A    Number of Children: 3  . Years of Education: 30yrs   Occupational History  . retired    Social History Main Topics  . Smoking status: Former Smoker -- 2.00 packs/day for 20 years    Types: Cigarettes    Quit date: 07/19/1977  . Smokeless tobacco: Never Used  . Alcohol Use: 4.2 oz/week    7 Glasses of wine per week     Comment: dinner wine daily  . Drug Use: No  . Sexual Activity: No   Other Topics Concern  . Not on  file   Social History Narrative   Pt lives at home alone.    Caffeine Use: very little    Current Outpatient Prescriptions on File Prior to Visit  Medication Sig Dispense Refill  . aspirin EC 81 MG tablet Take 81 mg by mouth daily.      [name] b complex vitamins tablet Take 1 tablet by mouth daily.      . chlorthalidone (HYGROTON) 25 MG tablet Take 25 mg by mouth every morning.      . Cholecalciferol (VITAMIN D PO) Take 2,000 mg by mouth daily.       . clopidogrel (PLAVIX) 75 MG tablet Take 75 mg by mouth daily with breakfast.      . Coenzyme Q10 (CO Q-10) 300 MG CAPS Take 1 capsule by mouth daily.      . Glucosamine-Chondroit-Vit C-Mn (GLUCOSAMINE 1500 COMPLEX PO) Take 1,500 mg by mouth daily.      [name] levothyroxine (SYNTHROID, LEVOTHROID) 50 MCG tablet Take 50 mcg by mouth as directed. Take 1 daily except Saturdays take  2 tabs      . NON FORMULARY Take 45 mg by mouth daily. Study Drug: Pioglitazone, and a placebo.Patient is not diabetic he is involved in an active study to determine if this medication can help prevent stroke.      [name] OVER THE COUNTER MEDICATION Take 1 capsule by mouth daily. Mega red 500 mg once daily      . pantoprazole (PROTONIX) 40 MG tablet TAKE 1 TABLET (40 MG TOTAL) BY MOUTH DAILY.  90 tablet  0  . Probiotic Product (PROBIOTIC PO) Take 1 capsule by mouth daily.       . ramipril (ALTACE) 10 MG capsule Take 1 capsule (10 mg total) by mouth 2 (two) times daily.  180 capsule  1  . rosuvastatin (CRESTOR) 40 MG tablet Take 40 mg by mouth every evening.       No current facility-administered medications on file prior to visit.    No Known Allergies  Review of Systems  Review of Systems  Constitutional: Negative for fever and malaise/fatigue.  HENT: Negative for congestion.   Eyes: Negative for discharge.  Respiratory: Negative for shortness of breath.   Cardiovascular: Negative for chest pain, palpitations and leg swelling.  Gastrointestinal: Negative for nausea, abdominal pain and diarrhea.  Genitourinary: Negative for dysuria.  Musculoskeletal: Negative for falls.  Skin: Negative for rash.  Neurological: Negative for loss of consciousness and headaches.  Endo/Heme/Allergies: Negative for polydipsia.  Psychiatric/Behavioral: Negative for depression and suicidal ideas. The patient is not nervous/anxious and does not have insomnia.     Objective  BP 122/76  Pulse 56  Temp(Src) 98.7 F (37.1 C) (Oral)  Resp 18  Ht 6\' 2"  (1.88 m)  Wt 229 lb (103.874 kg)  BMI 29.39 kg/m2  SpO2 99%  Physical Exam  Physical Exam  Constitutional: He is oriented to person, place, and time and well-developed, well-nourished, and in no distress. No distress.  HENT:  Head: Normocephalic and atraumatic.  Eyes: Conjunctivae are normal.  Neck: Neck supple. No thyromegaly present.  Cardiovascular:  Normal rate, regular rhythm and normal heart sounds.   No murmur heard. Pulmonary/Chest: Effort normal and breath sounds normal. No respiratory distress.  Abdominal: He exhibits no distension and no mass. There is no tenderness.  Musculoskeletal: He exhibits no edema.  Neurological: He is alert and oriented to person, place, and time.  Skin: Skin is warm.  Psychiatric: Memory, affect and judgment normal.  Lab Results  Component Value Date   TSH 11.298* 08/14/2013   Lab Results  Component Value Date   WBC 5.2 08/14/2013   HGB 11.9* 08/14/2013   HCT 36.7* 08/14/2013   MCV 95.8 08/14/2013   PLT 140* 08/14/2013   Lab Results  Component Value Date   CREATININE 1.03 08/14/2013   BUN 24* 08/14/2013   NA 139 08/14/2013   K 4.8 08/14/2013   CL 102 08/14/2013   CO2 30 08/14/2013   Lab Results  Component Value Date   ALT 13 08/14/2013   AST 20 08/14/2013   ALKPHOS 57 08/14/2013   BILITOT 0.5 08/14/2013   Lab Results  Component Value Date   CHOL 146 08/14/2013   Lab Results  Component Value Date   HDL 47 08/14/2013   Lab Results  Component Value Date   LDLCALC 82 08/14/2013   Lab Results  Component Value Date   TRIG 85 08/14/2013   Lab Results  Component Value Date   CHOLHDL 3.1 08/14/2013     Assessment & Plan   CEREBROVASCULAR ACCIDENT Is doing well, no recent episodes  CAROTID ARTERY OCCLUSION, WITH INFARCTION Has appt with Dr Donnetta Hutching in March for recurrent ultrasound  Essential hypertension, benign Well controlled on current meds  Hypothyroid Under treated at this time. Increase Levothyroxine back to daily  Mixed hyperlipidemia Doing well on Crestor daily, avoid trans fats.   Anemia Recurrent, mild, continue iron and we will continue to monitor  Epistaxis Controllable declines referral at this time, will call if worsens  Plantar fasciitis of left foot Following with sports med, doing better.

## 2013-09-05 ENCOUNTER — Ambulatory Visit: Payer: Medicare Other | Admitting: Family Medicine

## 2013-09-06 ENCOUNTER — Encounter: Payer: Self-pay | Admitting: Family Medicine

## 2013-09-06 DIAGNOSIS — R040 Epistaxis: Secondary | ICD-10-CM

## 2013-09-06 HISTORY — DX: Epistaxis: R04.0

## 2013-09-06 NOTE — Assessment & Plan Note (Signed)
Well-controlled on current meds 

## 2013-09-06 NOTE — Assessment & Plan Note (Signed)
Has appt with Dr Donnetta Hutching in March for recurrent ultrasound

## 2013-09-06 NOTE — Assessment & Plan Note (Signed)
Doing well on Crestor daily, avoid trans fats.

## 2013-09-06 NOTE — Assessment & Plan Note (Signed)
Recurrent, mild, continue iron and we will continue to monitor

## 2013-09-06 NOTE — Assessment & Plan Note (Signed)
Following with sports med, doing better.

## 2013-09-06 NOTE — Assessment & Plan Note (Signed)
Under treated at this time. Increase Levothyroxine back to daily

## 2013-09-06 NOTE — Assessment & Plan Note (Signed)
Controllable declines referral at this time, will call if worsens

## 2013-09-06 NOTE — Assessment & Plan Note (Signed)
Is doing well, no recent episodes

## 2013-09-18 ENCOUNTER — Encounter: Payer: Self-pay | Admitting: Vascular Surgery

## 2013-09-19 ENCOUNTER — Ambulatory Visit (HOSPITAL_COMMUNITY)
Admission: RE | Admit: 2013-09-19 | Discharge: 2013-09-19 | Disposition: A | Discharge status: Home / Self Care | Payer: Medicare Other | Source: Ambulatory Visit | Attending: Vascular Surgery | Admitting: Vascular Surgery

## 2013-09-19 ENCOUNTER — Ambulatory Visit (INDEPENDENT_AMBULATORY_CARE_PROVIDER_SITE_OTHER): Payer: Medicare Other | Admitting: Vascular Surgery

## 2013-09-19 ENCOUNTER — Encounter: Payer: Self-pay | Admitting: Vascular Surgery

## 2013-09-19 VITALS — BP 151/81 | HR 56 | Resp 14 | Ht 74.0 in | Wt 225.0 lb

## 2013-09-19 DIAGNOSIS — Z48812 Encounter for surgical aftercare following surgery on the circulatory system: Secondary | ICD-10-CM | POA: Insufficient documentation

## 2013-09-19 DIAGNOSIS — I6529 Occlusion and stenosis of unspecified carotid artery: Secondary | ICD-10-CM

## 2013-09-19 NOTE — Progress Notes (Signed)
The patient reports today for six-month followup of left carotid endarterectomy for severe asymptomatic disease. Surgery was on 02/13/2013. 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 He has not had any focal neurologic deficits. He does report that he occasionally has numbness in his left arm. This is sounds to be mostly positional and can occur when he first arises in the morning. He also reports some soreness in his left neck. He is left-handed. He specifically denies any transient ischemic attacks or any aphasia. He reports some sensitivity at the neck incision but the pain is resolved.  Past Medical History  Diagnosis Date  . Allergy     seasonal- mostly spring  . TOBACCO ABUSE, HX OF 03/17/2010  . Overweight 07/01/2010  . Personal history of other infectious and parasitic disease 03/17/2010  . Mixed hyperlipidemia 03/17/2010  . HYPERKALEMIA 05/27/2010  . FATIGUE 03/17/2010  . CHICKENPOX, HX OF 03/17/2010  . CAROTID ARTERY OCCLUSION, WITH INFARCTION 03/17/2010  . Hearing loss 11/04/2010    bilateral  . Anemia 02/03/2011  . Hypertension   . Tubular adenoma of colon 06/10/2011  . Hypothyroid 12/08/2011  . BCC (basal cell carcinoma) 12/08/2011  . Thrombocytopenia 04/05/2012  . ASTHMA, CHILDHOOD 03/17/2010    mostly as child  . Sleep apnea     uses mouthpiece only  . CEREBROVASCULAR ACCIDENT 03/17/2010    partial loss of peripheral vision on left-"slight improved"  . Epistaxis 09/06/2013    History  Substance Use Topics  . Smoking status: Former Smoker -- 2.00 packs/day for 20 years    Types: Cigarettes    Quit date: 07/19/1977  . Smokeless tobacco: Never Used  . Alcohol Use: 4.2 oz/week    7 Glasses of wine per week     Comment: dinner wine daily    Family History  Problem Relation Age of Onset  . Alzheimer's disease Mother   . Emphysema Sister     cigarettes  . Heart disease Son   . Coronary artery disease Brother   . Heart attack Brother   . Other Brother    heart problems- from fathers side  . Cancer Brother   . Heart disease Brother 41    Heart disease before age 2  . Cancer Sister     gyn  . Heart disease Sister   . Bipolar disorder Daughter   . Obesity Daughter   . Heart disease Sister     No Known Allergies  Current outpatient prescriptions:aspirin EC 81 MG tablet, Take 81 mg by mouth daily., Disp: , Rfl: ;  b complex vitamins tablet, Take 1 tablet by mouth daily., Disp: , Rfl: ;  chlorthalidone (HYGROTON) 25 MG tablet, Take 25 mg by mouth every morning., Disp: , Rfl: ;  Cholecalciferol (VITAMIN D PO), Take 2,000 mg by mouth daily. , Disp: , Rfl: ;  clopidogrel (PLAVIX) 75 MG tablet, Take 75 mg by mouth daily with breakfast., Disp: , Rfl:  Coenzyme Q10 (CO Q-10) 300 MG CAPS, Take 1 capsule by mouth daily., Disp: , Rfl: ;  Glucosamine-Chondroit-Vit C-Mn (GLUCOSAMINE 1500 COMPLEX PO), Take 1,500 mg by mouth daily., Disp: , Rfl: ;  levothyroxine (SYNTHROID, LEVOTHROID) 50 MCG tablet, Take 50 mcg by mouth as directed. Take 1 daily except Saturdays take 2 tabs, Disp: , Rfl:  NON FORMULARY, Take 45 mg by mouth daily. Study Drug: Pioglitazone, and a placebo.Patient is not diabetic he is involved in an active study to determine if this medication can help prevent stroke., Disp: ,  Rfl: ;  OVER THE COUNTER MEDICATION, Take 1 capsule by mouth daily. Mega red 500 mg once daily, Disp: , Rfl: ;  pantoprazole (PROTONIX) 40 MG tablet, TAKE 1 TABLET (40 MG TOTAL) BY MOUTH DAILY., Disp: 90 tablet, Rfl: 0 Probiotic Product (PROBIOTIC PO), Take 1 capsule by mouth daily. , Disp: , Rfl: ;  ramipril (ALTACE) 10 MG capsule, Take 1 capsule (10 mg total) by mouth 2 (two) times daily., Disp: 180 capsule, Rfl: 1;  rosuvastatin (CRESTOR) 40 MG tablet, Take 40 mg by mouth every evening., Disp: , Rfl:   BP 151/81  Pulse 56  Resp 14  Ht 6\' 2"  (1.88 m)  Wt 225 lb (102.059 kg)  BMI 28.88 kg/m2  SpO2 100%  Body mass index is 28.88 kg/(m^2).       On physical exam  he is a well-developed well-nourished gentleman in no acute distress Carotid arteries without bruits bilaterally with a well-healed left neck incision Neurologically he is grossly intact Respirations equal and nonlabored Heart regular rate and rhythm  Carotid duplex today reveals widely patent endarterectomy with no evidence of stenosis. 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 I am concerned regarding a potential cervical disc disease with the occasional numbness in his left arm. He is requesting referral to neurologic evaluation. He is also requesting to be referred to somewhere other than [facility] which we will coordinate. We will see him again in 6 months with repeat carotid duplex. He'll notify should he develop any focal deficits.

## 2013-09-27 ENCOUNTER — Ambulatory Visit (INDEPENDENT_AMBULATORY_CARE_PROVIDER_SITE_OTHER): Payer: Medicare Other | Admitting: Neurology

## 2013-09-27 ENCOUNTER — Other Ambulatory Visit: Payer: Medicare Other

## 2013-09-27 ENCOUNTER — Encounter: Payer: Self-pay | Admitting: Neurology

## 2013-09-27 VITALS — BP 136/72 | HR 78 | Temp 98.0°F | Resp 20 | Ht 74.0 in | Wt 225.2 lb

## 2013-09-27 DIAGNOSIS — R202 Paresthesia of skin: Secondary | ICD-10-CM

## 2013-09-27 DIAGNOSIS — R209 Unspecified disturbances of skin sensation: Secondary | ICD-10-CM

## 2013-09-27 DIAGNOSIS — Z9889 Other specified postprocedural states: Secondary | ICD-10-CM

## 2013-09-27 DIAGNOSIS — I63239 Cerebral infarction due to unspecified occlusion or stenosis of unspecified carotid arteries: Secondary | ICD-10-CM

## 2013-09-27 DIAGNOSIS — I63231 Cerebral infarction due to unspecified occlusion or stenosis of right carotid arteries: Secondary | ICD-10-CM

## 2013-09-27 DIAGNOSIS — G459 Transient cerebral ischemic attack, unspecified: Secondary | ICD-10-CM

## 2013-09-27 NOTE — Progress Notes (Signed)
NEUROLOGY CONSULTATION NOTE  IVA POSTEN MRN: [id number] DOB: 01-21-1935  Referring provider: [name] Primary care provider: Dr. [name]  Reason for consult:  History of stroke, right carotid artery occlusion, left CEA  HISTORY OF PRESENT ILLNESS: Tanner [name] is a 79 year old left-handed man with history of hypertension, hyperlipidemia, hypothyroidism, OSA and former smoker who presents for evaluation for stroke.  Records and images were personally reviewed where available.    In August 2011, he had a stroke, presenting as left visual field cut.  He had a right hemispheric stroke thought to be secondary to right ICA occlusion.  MRI of brain revealed an acute watershed infarct in the right PCA/MCA distributions.  CTA of the neck confirmed right common carotid artery occlusion at the origin.  The left ICA revealed approximately 50% stenosis just distal to the bulb.  CTA of the head revealed occluded right ICA with partial retrograde constitution from the left INCA via the PCA.  Threre was opacification of the right MCA and right ACA from the left ICA via the Acomm.  There was occlusion of the right PCA at its origin.  Hgb A1c at that time was 5.6 and LDL was 210.  He was placed on Plavix.  He initially followed up with [name] at [facility].  Follow up carotid dopplers revealed increased stenosis of the left ICA.  Carotid doppler from 01/03/13 revealed more than 70% stenosis involving the left bulb and left proximal ICA and 50-69% stenosis involving the left mid ICA, but without plaque.  Stenosis of left ECA and left VA noted.  CTA of the neck performed on 01/19/13 revealed 75-80% stenosis of the left ICA.  He underwent a left CEA on 02/13/13.  He continues to follow up with [name] from vascular surgery.  09/21/13 Carotid dopplers:  Patent left ICA CEA without evidence of restenosis or hyperplasia. 08/14/13 LDL 74, fasting glucose 95 02/07/13 2D Echo:  LVEF 55-65%, no regional wall motion  abnormalities, mild MVR, mildly dilated left atrium, mildly dilated right ventricle, mildly increased systolic pressure in pulmonary arteries (PA peak pressure 32mm Hg).  He currently takes Plavix 75mg , ASA 81mg , Crestor.  Of note, he occasionally would experience numbness and tingling of the left arm when he wakes up in the morning.  It involves the entire hand and arm radiating up to the shoulder.  It lasts only a couple of seconds.  He sleeps on his left side.  One time, he had numbness only in the 5th digit.  He denies neck pain or weakness.  He hasn't had this happen for a couple of weeks, however.  PAST MEDICAL HISTORY: Past Medical History  Diagnosis Date  . Allergy     seasonal- mostly spring  . TOBACCO ABUSE, HX OF 03/17/2010  . Overweight 07/01/2010  . Personal history of other infectious and parasitic disease 03/17/2010  . Mixed hyperlipidemia 03/17/2010  . HYPERKALEMIA 05/27/2010  . FATIGUE 03/17/2010  . CHICKENPOX, HX OF 03/17/2010  . CAROTID ARTERY OCCLUSION, WITH INFARCTION 03/17/2010  . Hearing loss 11/04/2010    bilateral  . Anemia 02/03/2011  . Hypertension   . Tubular adenoma of colon 06/10/2011  . Hypothyroid 12/08/2011  . BCC (basal cell carcinoma) 12/08/2011  . Thrombocytopenia 04/05/2012  . ASTHMA, CHILDHOOD 03/17/2010    mostly as child  . Sleep apnea     uses mouthpiece only  . CEREBROVASCULAR ACCIDENT 03/17/2010    partial loss of peripheral vision on left-"slight improved"  .  Epistaxis 09/06/2013    PAST SURGICAL HISTORY: Past Surgical History  Procedure Laterality Date  . Tonsillectomy    . Pilonidal cyst excision  1968    removed  . Knee cartliage repair  1964, 1990    bilateral  . Rotator cuff repair      right  . Endarterectomy Left 02/13/2013    Procedure: ENDARTERECTOMY CAROTID;  Surgeon: Rosetta Posner, MD;  Location: [place];  Service: Vascular;  Laterality: Left;  . Eus N/A 08/17/2013    Procedure: UPPER ENDOSCOPIC ULTRASOUND (EUS) LINEAR;  Surgeon:  Milus Banister, MD;  Location: [facility];  Service: Endoscopy;  Laterality: N/A;  . Carotid endarterectomy Left February 13, 2013    cea    MEDICATIONS: Current Outpatient Prescriptions on File Prior to Visit  Medication Sig Dispense Refill  . aspirin EC 81 MG tablet Take 81 mg by mouth daily.      [name] b complex vitamins tablet Take 1 tablet by mouth daily.      . chlorthalidone (HYGROTON) 25 MG tablet Take 25 mg by mouth every morning.      . Cholecalciferol (VITAMIN D PO) Take 2,000 mg by mouth daily.       . clopidogrel (PLAVIX) 75 MG tablet Take 75 mg by mouth daily with breakfast.      . Coenzyme Q10 (CO Q-10) 300 MG CAPS Take 1 capsule by mouth daily.      . Glucosamine-Chondroit-Vit C-Mn (GLUCOSAMINE 1500 COMPLEX PO) Take 1,500 mg by mouth daily.      [name] levothyroxine (SYNTHROID, LEVOTHROID) 50 MCG tablet Take 50 mcg by mouth as directed. Take 1 daily except Saturdays take 2 tabs      . NON FORMULARY Take 45 mg by mouth daily. Study Drug: Pioglitazone, and a placebo.Patient is not diabetic he is involved in an active study to determine if this medication can help prevent stroke.      [name] OVER THE COUNTER MEDICATION Take 1 capsule by mouth daily. Mega red 500 mg once daily      . pantoprazole (PROTONIX) 40 MG tablet TAKE 1 TABLET (40 MG TOTAL) BY MOUTH DAILY.  90 tablet  0  . Probiotic Product (PROBIOTIC PO) Take 1 capsule by mouth daily.       . ramipril (ALTACE) 10 MG capsule Take 1 capsule (10 mg total) by mouth 2 (two) times daily.  180 capsule  1  . rosuvastatin (CRESTOR) 40 MG tablet Take 40 mg by mouth every evening.       No current facility-administered medications on file prior to visit.    ALLERGIES: No Known Allergies  FAMILY HISTORY: Family History  Problem Relation Age of Onset  . Alzheimer's disease Mother   . Emphysema Sister     cigarettes  . Heart disease Son   . Coronary artery disease Brother   . Heart attack Brother   . Other Brother     heart problems-  from fathers side  . Cancer Brother   . Heart disease Brother 91    Heart disease before age 90  . Cancer Sister     gyn  . Heart disease Sister   . Bipolar disorder Daughter   . Obesity Daughter   . Heart disease Sister     SOCIAL HISTORY: History   Social History  . Marital Status: Divorced    Spouse Name: N/A    Number of Children: 3  . Years of Education: 64yrs   Occupational History  .  retired    Social History Main Topics  . Smoking status: Former Smoker -- 2.00 packs/day for 20 years    Types: Cigarettes    Quit date: 07/19/1977  . Smokeless tobacco: Never Used  . Alcohol Use: 4.2 oz/week    7 Glasses of wine per week     Comment: dinner wine daily  . Drug Use: No  . Sexual Activity: No   Other Topics Concern  . Not on file   Social History Narrative   Pt lives at home alone.    Caffeine Use: very little    REVIEW OF SYSTEMS: Constitutional: No fevers, chills, or sweats, no generalized fatigue, change in appetite Eyes: No visual changes, double vision, eye pain Ear, nose and throat: No hearing loss, ear pain, nasal congestion, sore throat Cardiovascular: No chest pain, palpitations Respiratory:  No shortness of breath at rest or with exertion, wheezes GastrointestinaI: No nausea, vomiting, diarrhea, abdominal pain, fecal incontinence Genitourinary:  No dysuria, urinary retention or frequency Musculoskeletal:  Foot pain Integumentary: No rash, pruritus, skin lesions Neurological: as above Psychiatric: No depression, insomnia, anxiety Endocrine: No palpitations, fatigue, diaphoresis, mood swings, change in appetite, change in weight, increased thirst Hematologic/Lymphatic:  No anemia, purpura, petechiae. Allergic/Immunologic: no itchy/runny eyes, nasal congestion, recent allergic reactions, rashes  PHYSICAL EXAM: Filed Vitals:   09/27/13 0828  BP: 136/72  Pulse: 78  Temp: 98 F (36.7 C)  Resp: 20   General: No acute distress Head:   Normocephalic/atraumatic Neck: supple, no paraspinal tenderness, full range of motion Back: No paraspinal tenderness Heart: regular rate and rhythm Lungs: Clear to auscultation bilaterally. Vascular: No carotid bruits. Neurological Exam: Mental status: alert and oriented to person, place, and time, speech fluent and not dysarthric, language intact. Cranial nerves: CN I: not tested CN II: pupils equal, round and reactive to light, visual fields intact, fundi unremarkable. CN III, IV, VI:  full range of motion, no nystagmus, no ptosis CN V: facial sensation intact CN VII: upper and lower face symmetric CN VIII: hearing intact CN IX, X: gag intact, uvula midline CN XI: sternocleidomastoid and trapezius muscles intact CN XII: tongue midline Bulk & Tone: normal, no fasciculations. Motor: 5/5 throughout Sensation: Reduced vibration in toes, otherwise pinprick and vibration intact. Deep Tendon Reflexes: 2+ throughout, except absent in ankles, toes down Finger to nose testing: normal Heel to shin: normal Gait: normal stance and stride.  Able to turn, walk on toes and in tandem. Romberg negative.  IMPRESSION: Right MCA/PCA watershed infarct, secondary to right ICA occlusion Asymptomatic left ICA disease status post left CEA Numbness in arm may be related to sleeping on it or mild ulnar neuropathy.  Do not suspect CNS etiology.  PLAN: Continue Plavix and aspirin Continue statin therapy (LDL at goal <100) Blood pressure control with goal below 140/90 For sake of completeness, will check Hgb A1c. Exercise and Mediterranean diet Carotid doppler follow up in 6 months as per Dr. Arbie CookeyEarly Follow up in September after repeat doppler.  45 minutes spent with patient, over 50% spent counseling and coordinating care.   Thank you for allowing me to take part in the care of this patient.  Shon MilletAdam Jaffe, DO  CC:  Danise EdgeStacey Blyth, MD  Gretta Beganodd Early, MD

## 2013-09-27 NOTE — Patient Instructions (Addendum)
1.  We will check a hemoglobin A1c, which tests for diabetes. Labs will be collected at [facility]  2.  I will see you in September after your carotid doppler.

## 2013-09-28 ENCOUNTER — Other Ambulatory Visit (INDEPENDENT_AMBULATORY_CARE_PROVIDER_SITE_OTHER): Payer: Medicare Other

## 2013-09-28 DIAGNOSIS — R209 Unspecified disturbances of skin sensation: Secondary | ICD-10-CM

## 2013-09-28 DIAGNOSIS — R200 Anesthesia of skin: Secondary | ICD-10-CM

## 2013-09-28 DIAGNOSIS — R202 Paresthesia of skin: Principal | ICD-10-CM

## 2013-09-28 LAB — HEMOGLOBIN A1C: Hgb A1c MFr Bld: 5.7 % (ref 4.6–6.5)

## 2013-09-29 ENCOUNTER — Other Ambulatory Visit: Payer: Self-pay

## 2013-09-29 ENCOUNTER — Encounter: Payer: Self-pay | Admitting: Family Medicine

## 2013-09-29 ENCOUNTER — Telehealth: Payer: Self-pay | Admitting: *Deleted

## 2013-09-29 DIAGNOSIS — E039 Hypothyroidism, unspecified: Secondary | ICD-10-CM

## 2013-09-29 MED ORDER — RAMIPRIL 10 MG PO CAPS: 10.0000 mg | ORAL_CAPSULE | Freq: Two times a day (BID) | ORAL | Status: DC

## 2013-09-29 MED ORDER — LEVOTHYROXINE SODIUM 50 MCG PO TABS: 50.0000 ug | ORAL_TABLET | ORAL | Status: DC

## 2013-09-29 MED ORDER — CLOPIDOGREL BISULFATE 75 MG PO TABS: 75.0000 mg | ORAL_TABLET | Freq: Every day | ORAL | Status: DC

## 2013-09-29 MED ORDER — CHLORTHALIDONE 25 MG PO TABS: 25.0000 mg | ORAL_TABLET | Freq: Every morning | ORAL | Status: DC

## 2013-09-29 MED ORDER — ROSUVASTATIN CALCIUM 40 MG PO TABS: 40.0000 mg | ORAL_TABLET | Freq: Every evening | ORAL | Status: DC

## 2013-09-29 NOTE — Telephone Encounter (Signed)
Patient is aware of  HG A1C is normal

## 2013-10-17 ENCOUNTER — Other Ambulatory Visit (INDEPENDENT_AMBULATORY_CARE_PROVIDER_SITE_OTHER): Payer: Medicare Other

## 2013-10-17 DIAGNOSIS — E039 Hypothyroidism, unspecified: Secondary | ICD-10-CM

## 2013-10-17 LAB — TSH: TSH: 2.16 u[IU]/mL (ref 0.35–5.50)

## 2013-10-17 LAB — T4, FREE: Free T4: 0.73 ng/dL (ref 0.60–1.60)

## 2013-11-28 ENCOUNTER — Encounter: Payer: Self-pay | Admitting: Family Medicine

## 2013-11-28 DIAGNOSIS — E039 Hypothyroidism, unspecified: Secondary | ICD-10-CM

## 2013-11-28 DIAGNOSIS — D649 Anemia, unspecified: Secondary | ICD-10-CM

## 2013-11-28 MED ORDER — PANTOPRAZOLE SODIUM 40 MG PO TBEC: 40.0000 mg | DELAYED_RELEASE_TABLET | Freq: Every day | ORAL | Status: DC

## 2013-11-28 NOTE — Telephone Encounter (Signed)
RX sent.   Please advise pts other mychart question

## 2013-11-28 NOTE — Addendum Note (Signed)
Addended by: Varney Daily on: 11/28/2013 06:33 PM   Modules accepted: Orders

## 2013-11-30 ENCOUNTER — Ambulatory Visit: Payer: Medicare Other | Admitting: Family Medicine

## 2013-12-05 ENCOUNTER — Other Ambulatory Visit (INDEPENDENT_AMBULATORY_CARE_PROVIDER_SITE_OTHER): Payer: Medicare Other

## 2013-12-05 DIAGNOSIS — D649 Anemia, unspecified: Secondary | ICD-10-CM

## 2013-12-05 DIAGNOSIS — E039 Hypothyroidism, unspecified: Secondary | ICD-10-CM

## 2013-12-05 LAB — CBC
HEMATOCRIT: 37.9 % — AB (ref 39.0–52.0)
HEMOGLOBIN: 12.6 g/dL — AB (ref 13.0–17.0)
MCHC: 33.2 g/dL (ref 30.0–36.0)
MCV: 96.1 fl (ref 78.0–100.0)
Platelets: 141 10*3/uL — ABNORMAL LOW (ref 150.0–400.0)
RBC: 3.95 Mil/uL — AB (ref 4.22–5.81)
RDW: 15.7 % — ABNORMAL HIGH (ref 11.5–15.5)
WBC: 5.2 10*3/uL (ref 4.0–10.5)

## 2013-12-05 LAB — TSH: TSH: 5.02 u[IU]/mL — ABNORMAL HIGH (ref 0.35–4.50)

## 2013-12-05 LAB — T4, FREE: Free T4: 0.82 ng/dL (ref 0.60–1.60)

## 2013-12-07 ENCOUNTER — Ambulatory Visit: Payer: Medicare Other | Admitting: Family Medicine

## 2013-12-14 ENCOUNTER — Encounter: Payer: Self-pay | Admitting: Family Medicine

## 2013-12-14 ENCOUNTER — Ambulatory Visit (INDEPENDENT_AMBULATORY_CARE_PROVIDER_SITE_OTHER): Payer: Medicare Other | Admitting: Family Medicine

## 2013-12-14 ENCOUNTER — Telehealth: Payer: Self-pay | Admitting: Family Medicine

## 2013-12-14 VITALS — BP 158/70 | HR 52 | Temp 97.9°F | Ht 74.0 in | Wt 225.1 lb

## 2013-12-14 DIAGNOSIS — E039 Hypothyroidism, unspecified: Secondary | ICD-10-CM

## 2013-12-14 DIAGNOSIS — E875 Hyperkalemia: Secondary | ICD-10-CM

## 2013-12-14 DIAGNOSIS — D126 Benign neoplasm of colon, unspecified: Secondary | ICD-10-CM

## 2013-12-14 DIAGNOSIS — D649 Anemia, unspecified: Secondary | ICD-10-CM

## 2013-12-14 DIAGNOSIS — I10 Essential (primary) hypertension: Secondary | ICD-10-CM

## 2013-12-14 DIAGNOSIS — E782 Mixed hyperlipidemia: Secondary | ICD-10-CM

## 2013-12-14 DIAGNOSIS — I63239 Cerebral infarction due to unspecified occlusion or stenosis of unspecified carotid arteries: Secondary | ICD-10-CM

## 2013-12-14 MED ORDER — LEVOTHYROXINE SODIUM 75 MCG PO TABS: 75.0000 ug | ORAL_TABLET | Freq: Every day | ORAL | Status: DC

## 2013-12-14 NOTE — Progress Notes (Signed)
Pre visit review using our clinic review tool, if applicable. No additional management support is needed unless otherwise documented below in the visit note. 

## 2013-12-14 NOTE — Telephone Encounter (Signed)
Lab order week of 04-07-2014 [facility] comments: Tsh, free t4, cbc, vitamin b12, intrinsic factor, renal, cbc, lipid, hgba1c, hepatic, medicare PSA at [facility]

## 2013-12-14 NOTE — Progress Notes (Signed)
Patient ID: Tanner [name], male   DOB: 06/06/1935, 79 y.o.   MRN: [id number] KENNER LEWAN [id number] [birth year] 12/14/2013      Progress Note-Follow Up  Subjective  Chief Complaint  Chief Complaint  Patient presents with  . Follow-up    12 week    HPI  Patient is a 79 year old male in today for routine medical care. Is doing well. She's undergone endoscopy recently which was unremarkable. His bowels are moving comfortably without any blood. No recent illness. No neurologic complaints. Denies CP/palp/SOB/HA/congestion/fevers/GI or GU c/o. Taking meds as prescribed Past Medical History  Diagnosis Date  . Allergy     seasonal- mostly spring  . TOBACCO ABUSE, HX OF 03/17/2010  . Overweight 07/01/2010  . Personal history of other infectious and parasitic disease 03/17/2010  . Mixed hyperlipidemia 03/17/2010  . HYPERKALEMIA 05/27/2010  . FATIGUE 03/17/2010  . CHICKENPOX, HX OF 03/17/2010  . CAROTID ARTERY OCCLUSION, WITH INFARCTION 03/17/2010  . Hearing loss 11/04/2010    bilateral  . Anemia 02/03/2011  . Hypertension   . Tubular adenoma of colon 06/10/2011  . Hypothyroid 12/08/2011  . BCC (basal cell carcinoma) 12/08/2011  . Thrombocytopenia 04/05/2012  . ASTHMA, CHILDHOOD 03/17/2010    mostly as child  . Sleep apnea     uses mouthpiece only  . CEREBROVASCULAR ACCIDENT 03/17/2010    partial loss of peripheral vision on left-"slight improved"  . Epistaxis 09/06/2013    Past Surgical History  Procedure Laterality Date  . Tonsillectomy    . Pilonidal cyst excision  1968    removed  . Knee cartliage repair  1964, 1990    bilateral  . Rotator cuff repair      right  . Endarterectomy Left 02/13/2013    Procedure: ENDARTERECTOMY CAROTID;  Surgeon: Rosetta Posner, MD;  Location: [place];  Service: Vascular;  Laterality: Left;  . Eus N/A 08/17/2013    Procedure: UPPER ENDOSCOPIC ULTRASOUND (EUS) LINEAR;  Surgeon: Milus Banister, MD;  Location: [facility];  Service: Endoscopy;   Laterality: N/A;  . Carotid endarterectomy Left February 13, 2013    cea    Family History  Problem Relation Age of Onset  . Alzheimer's disease Mother   . Emphysema Sister     cigarettes  . Heart disease Son   . Coronary artery disease Brother   . Heart attack Brother   . Other Brother     heart problems- from fathers side  . Cancer Brother   . Heart disease Brother 80    Heart disease before age 59  . Cancer Sister     gyn  . Heart disease Sister   . Bipolar disorder Daughter   . Obesity Daughter   . Heart disease Sister     History   Social History  . Marital Status: Divorced    Spouse Name: N/A    Number of Children: 3  . Years of Education: 16yrs   Occupational History  . retired    Social History Main Topics  . Smoking status: Former Smoker -- 2.00 packs/day for 20 years    Types: Cigarettes    Quit date: 07/19/1977  . Smokeless tobacco: Never Used  . Alcohol Use: 4.2 oz/week    7 Glasses of wine per week     Comment: dinner wine daily  . Drug Use: No  . Sexual Activity: No   Other Topics Concern  . Not on file   Social History Narrative  Pt lives at home alone.    Caffeine Use: very little    Current Outpatient Prescriptions on File Prior to Visit  Medication Sig Dispense Refill  . aspirin EC 81 MG tablet Take 81 mg by mouth daily.      [name] b complex vitamins tablet Take 1 tablet by mouth daily.      . chlorthalidone (HYGROTON) 25 MG tablet Take 1 tablet (25 mg total) by mouth every morning.  90 tablet  1  . Cholecalciferol (VITAMIN D PO) Take 2,000 mg by mouth daily.       . clopidogrel (PLAVIX) 75 MG tablet Take 1 tablet (75 mg total) by mouth daily with breakfast.  90 tablet  1  . Coenzyme Q10 (CO Q-10) 300 MG CAPS Take 1 capsule by mouth daily.      . Glucosamine-Chondroit-Vit C-Mn (GLUCOSAMINE 1500 COMPLEX PO) Take 1,500 mg by mouth daily.      [name] levothyroxine (SYNTHROID, LEVOTHROID) 50 MCG tablet Take 1 tablet (50 mcg total) by mouth as  directed. Take 1 daily except Saturdays take 2 tabs  94 tablet  1  . NON FORMULARY Take 45 mg by mouth daily. Study Drug: Pioglitazone, and a placebo.Patient is not diabetic he is involved in an active study to determine if this medication can help prevent stroke.      [name] OVER THE COUNTER MEDICATION Take 1 capsule by mouth daily. Mega red 500 mg once daily      . pantoprazole (PROTONIX) 40 MG tablet Take 1 tablet (40 mg total) by mouth daily.  90 tablet  1  . Probiotic Product (PROBIOTIC PO) Take 1 capsule by mouth daily.       . ramipril (ALTACE) 10 MG capsule Take 1 capsule (10 mg total) by mouth 2 (two) times daily.  180 capsule  1  . rosuvastatin (CRESTOR) 40 MG tablet Take 1 tablet (40 mg total) by mouth every evening.  90 tablet  1   No current facility-administered medications on file prior to visit.    No Known Allergies  Review of Systems  Review of Systems  Constitutional: Negative for fever and malaise/fatigue.  HENT: Negative for congestion.   Eyes: Negative for discharge.  Respiratory: Negative for shortness of breath.   Cardiovascular: Negative for chest pain, palpitations and leg swelling.  Gastrointestinal: Negative for nausea, abdominal pain and diarrhea.  Genitourinary: Negative for dysuria.  Musculoskeletal: Negative for falls.  Skin: Negative for rash.  Neurological: Negative for loss of consciousness and headaches.  Endo/Heme/Allergies: Negative for polydipsia.  Psychiatric/Behavioral: Negative for depression and suicidal ideas. The patient is not nervous/anxious and does not have insomnia.     Objective  BP 158/70  Pulse 52  Temp(Src) 97.9 F (36.6 C) (Oral)  Ht 6\' 2"  (1.88 m)  Wt 225 lb 1.9 oz (102.114 kg)  BMI 28.89 kg/m2  SpO2 97%  Physical Exam  Physical Exam  Constitutional: He is oriented to person, place, and time and well-developed, well-nourished, and in no distress. No distress.  HENT:  Head: Normocephalic and atraumatic.  Eyes:  Conjunctivae are normal.  Neck: Neck supple. No thyromegaly present.  Cardiovascular: Normal rate, regular rhythm and normal heart sounds.   No murmur heard. Pulmonary/Chest: Effort normal and breath sounds normal. No respiratory distress.  Abdominal: He exhibits no distension and no mass. There is no tenderness.  Musculoskeletal: He exhibits no edema.  Neurological: He is alert and oriented to person, place, and time.  Skin: Skin is warm.  Psychiatric: Memory, affect and judgment normal.    Lab Results  Component Value Date   TSH 5.02* 12/05/2013   Lab Results  Component Value Date   WBC 5.2 12/05/2013   HGB 12.6* 12/05/2013   HCT 37.9* 12/05/2013   MCV 96.1 12/05/2013   PLT 141.0* 12/05/2013   Lab Results  Component Value Date   CREATININE 1.03 08/14/2013   BUN 24* 08/14/2013   NA 139 08/14/2013   K 4.8 08/14/2013   CL 102 08/14/2013   CO2 30 08/14/2013   Lab Results  Component Value Date   ALT 13 08/14/2013   AST 20 08/14/2013   ALKPHOS 57 08/14/2013   BILITOT 0.5 08/14/2013   Lab Results  Component Value Date   CHOL 146 08/14/2013   Lab Results  Component Value Date   HDL 47 08/14/2013   Lab Results  Component Value Date   LDLCALC 82 08/14/2013   Lab Results  Component Value Date   TRIG 85 08/14/2013   Lab Results  Component Value Date   CHOLHDL 3.1 08/14/2013     Assessment & Plan   Essential hypertension, benign Improved on recheck. Adequately controlled, no changes to meds. Encouraged heart healthy diet such as the DASH diet and exercise as tolerated.   CAROTID ARTERY OCCLUSION, WITH INFARCTION Stable following annually with vascular  Hypothyroid On Levothyroxine, continue to monitor. Increase Levothyroxine to 75 mcg daily  Mixed hyperlipidemia Tolerating statin, encouraged heart healthy diet, avoid trans fats, minimize simple carbs and saturated fats. Increase exercise as tolerated  Anemia Improving.  Tubular adenoma of colon Recently underwent  colonoscopy no concerning findings.

## 2013-12-14 NOTE — Patient Instructions (Addendum)
Labs prior to next visit at [place] The [employer] stands for "Dietary Approaches to Stop Hypertension." It is a healthy eating plan that has been shown to reduce high blood pressure (hypertension) in as little as 14 days, while also possibly providing other significant health benefits. These other health benefits include reducing the risk of breast cancer after menopause and reducing the risk of type 2 diabetes, heart disease, colon cancer, and stroke. Health benefits also include weight loss and slowing kidney failure in patients with chronic kidney disease.  DIET GUIDELINES  Limit salt (sodium). Your diet should contain less than 1500 mg of sodium daily.  Limit refined or processed carbohydrates. Your diet should include mostly whole grains. Desserts and added sugars should be used sparingly.  Include small amounts of heart-healthy fats. These types of fats include nuts, oils, and tub margarine. Limit saturated and trans fats. These fats have been shown to be harmful in the body. CHOOSING FOODS  The following food groups are based on a 2000 calorie diet. See your Registered Dietitian for individual calorie needs. Grains and Grain Products (6 to 8 servings daily)  Eat More Often: Whole-wheat bread, brown rice, whole-grain or wheat pasta, quinoa, popcorn without added fat or salt (air popped).  Eat Less Often: White bread, white pasta, white rice, cornbread. Vegetables (4 to 5 servings daily)  Eat More Often: Fresh, frozen, and canned vegetables. Vegetables may be raw, steamed, roasted, or grilled with a minimal amount of fat.  Eat Less Often/Avoid: Creamed or fried vegetables. Vegetables in a cheese sauce. Fruit (4 to 5 servings daily)  Eat More Often: All fresh, canned (in natural juice), or frozen fruits. Dried fruits without added sugar. One hundred percent fruit juice ( cup [237 mL] daily).  Eat Less Often: Dried fruits with added sugar. Canned fruit in light or heavy  syrup. [employer], Fish, and Poultry (2 servings or less daily. One serving is 3 to 4 oz [85-114 g]).  Eat More Often: Ninety percent or leaner ground beef, tenderloin, sirloin. Round cuts of beef, chicken breast, [place] breast. All fish. Grill, bake, or broil your meat. Nothing should be fried.  Eat Less Often/Avoid: Fatty cuts of meat, [place], or chicken leg, thigh, or wing. Fried cuts of meat or fish. Dairy (2 to 3 servings)  Eat More Often: Low-fat or fat-free milk, low-fat plain or light yogurt, reduced-fat or part-skim cheese.  Eat Less Often/Avoid: Milk (whole, 2%).Whole milk yogurt. Full-fat cheeses. Nuts, Seeds, and Legumes (4 to 5 servings per week)  Eat More Often: All without added salt.  Eat Less Often/Avoid: Salted nuts and seeds, canned beans with added salt. Fats and Sweets (limited)  Eat More Often: Vegetable oils, tub margarines without trans fats, sugar-free gelatin. Mayonnaise and salad dressings.  Eat Less Often/Avoid: Coconut oils, palm oils, butter, stick margarine, cream, half and half, cookies, candy, pie. FOR MORE INFORMATION The Dash Diet Eating Plan: www.dashdiet.org Document Released: 06/25/2011 Document Revised: 09/28/2011 Document Reviewed: 06/25/2011 [facility] Patient Information 2014 [place], [place].

## 2013-12-14 NOTE — Addendum Note (Signed)
Addended by: Varney Daily on: 12/14/2013 12:11 PM   Modules accepted: Orders

## 2013-12-17 ENCOUNTER — Encounter: Payer: Self-pay | Admitting: Family Medicine

## 2013-12-17 NOTE — Assessment & Plan Note (Signed)
Recently underwent colonoscopy no concerning findings.

## 2013-12-17 NOTE — Assessment & Plan Note (Signed)
Stable following annually with vascular

## 2013-12-17 NOTE — Assessment & Plan Note (Signed)
On Levothyroxine, continue to monitor. Increase Levothyroxine to 75 mcg daily

## 2013-12-17 NOTE — Assessment & Plan Note (Signed)
Improved on recheck. Adequately controlled, no changes to meds. Encouraged heart healthy diet such as the DASH diet and exercise as tolerated.

## 2013-12-17 NOTE — Assessment & Plan Note (Signed)
Improving.

## 2013-12-17 NOTE — Assessment & Plan Note (Signed)
Tolerating statin, encouraged heart healthy diet, avoid trans fats, minimize simple carbs and saturated fats. Increase exercise as tolerated 

## 2014-01-12 IMAGING — CT CT ANGIO NECK
1 of 8 series · 5 of 46 positions shown, 10 images · IV contrast (Omnipaque 300)
Comparison: 03/11/2010.

CLINICAL DATA: Reevaluate blockage. History of carotid occlusion.
History of prior cerebrovascular accident.  History of tobacco
abuse, hyperlipidemia, and hypertension.

CT ANGIOGRAPHY NECK
TECHNIQUE: Multidetector CT imaging of the neck was performed
using the standard protocol during bolus administration of
intravenous contrast.  Multiplanar CT image reconstructions
including MIPs were obtained to evaluate the vascular anatomy.
Carotid stenosis measurements (when applicable) are obtained
utilizing NASCET criteria, using the distal internal carotid
diameter as the denominator.
Contrast: 80mL OMNIPAQUE IOHEXOL 350 MG/ML SOLN

[Series 5: (person_name) 3.0 b30f st · axial · 0.47mm/px · z∈[-208,-52]mm · 5 of 79 slices shown, 10 images]
[im 14/79  soft-tissue]
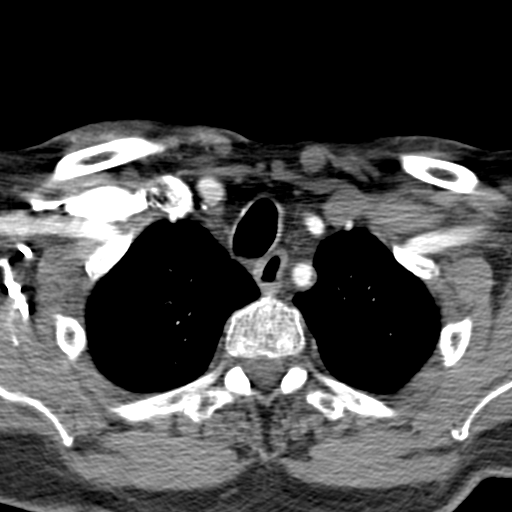
[im 14/79  bone]
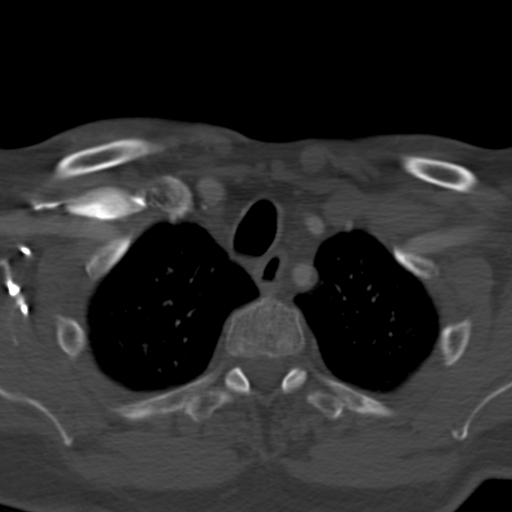
[im 27/79  soft-tissue]
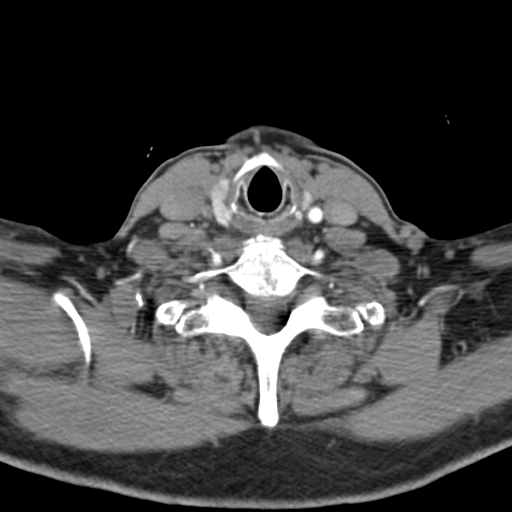
[im 27/79  lung]
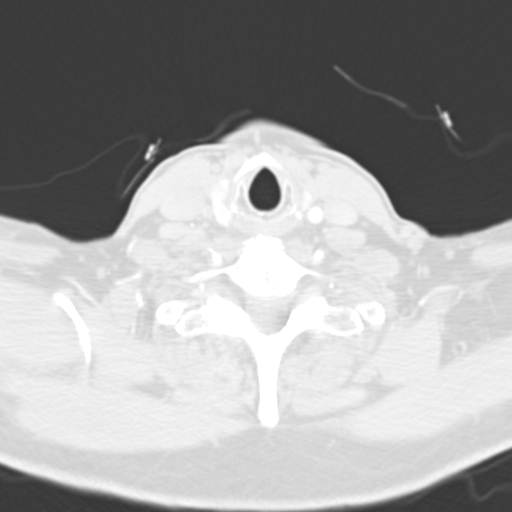
[im 40/79  soft-tissue]
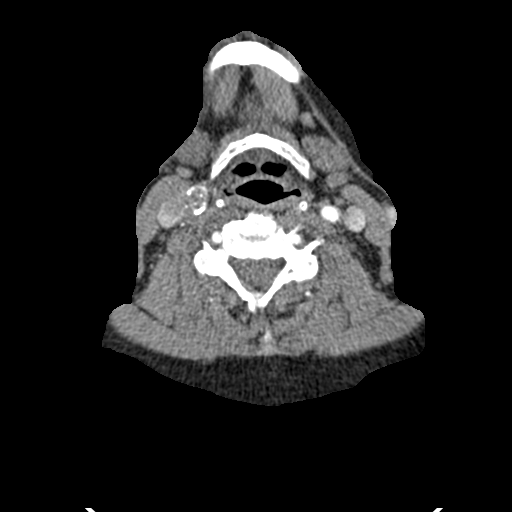
[im 40/79  lung]
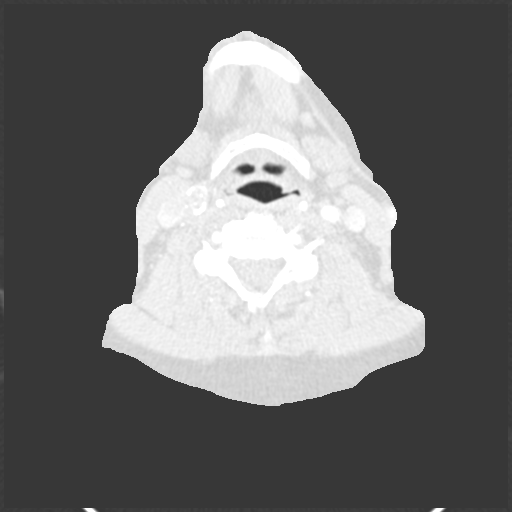
[im 53/79  soft-tissue]
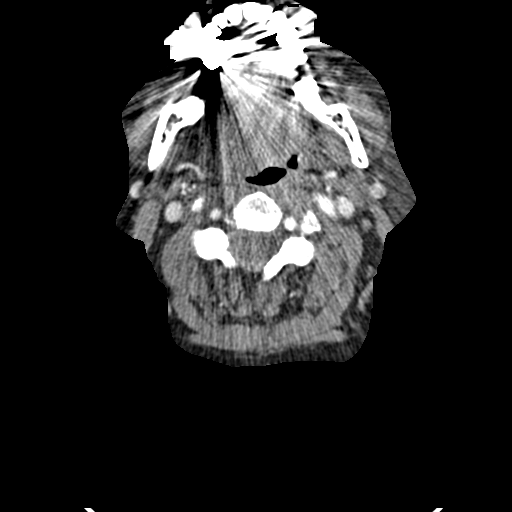
[im 53/79  lung]
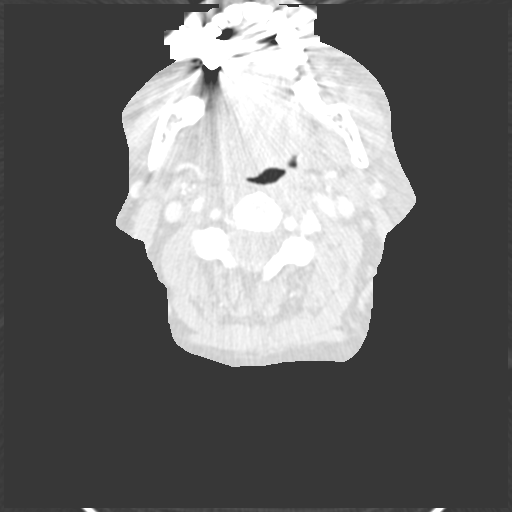
[im 66/79  soft-tissue]
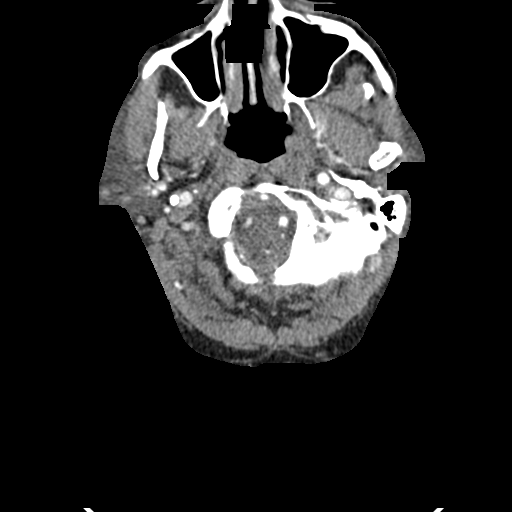
[im 66/79  lung]
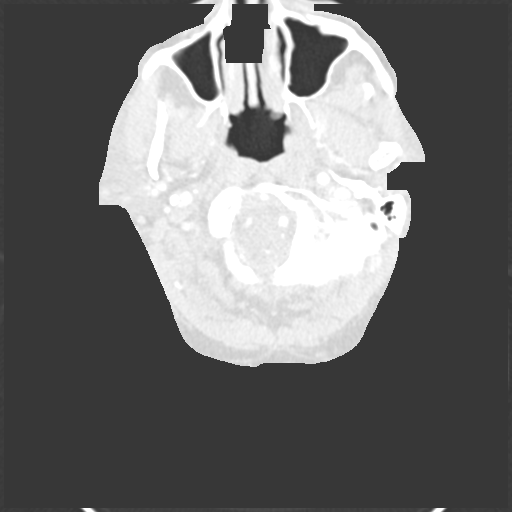

[5 of 46 positions shown; findings below may reference images not displayed]

FINDINGS: The transverse arch demonstrates moderate atheromatous
change.  Progressive proximal left subclavian stenosis estimated at
50%.  No innominate, left common, or right subclavian flow limiting
lesions.  Unchanged small right thyroid nodules.

Occluded right common carotid artery at its origin.  No significant
reconstitution at the bifurcation of the right internal carotid
artery in the neck.    Prominent ascending pharyngeal on the right
simulates string sign.

Progressive narrowing of the lumen at the left carotid bifurcation.
Complex, predominately calcific plaque LICA origin demonstrates 75-
80% stenosis (1.3/5.5 ratio).  This is increased from 9188.  No
evidence for carotid dissection.

Vertebral arteries remain patent and roughly equal in size
bilaterally.  The right vertebral  appears to contribute primarily
to the PICA.  Moderate cervical spondylosis.  Unremarkable airway.
Lung apices clear.

 Review of the MIP images confirms the above findings.
IMPRESSION: No reconstitution of the occluded right common carotid artery and
right internal carotid artery compared to 9188.

Progressive atherosclerotic narrowing of the left internal carotid
artery at its origin, potentially flow reducing at 75-80% stenosis.
See discussion above.

## 2014-03-02 ENCOUNTER — Other Ambulatory Visit: Payer: Self-pay | Admitting: Family Medicine

## 2014-03-21 ENCOUNTER — Encounter: Payer: Self-pay | Admitting: Family

## 2014-03-22 ENCOUNTER — Ambulatory Visit (INDEPENDENT_AMBULATORY_CARE_PROVIDER_SITE_OTHER): Payer: Medicare Other | Admitting: Family

## 2014-03-22 ENCOUNTER — Ambulatory Visit (HOSPITAL_COMMUNITY)
Admission: RE | Admit: 2014-03-22 | Discharge: 2014-03-22 | Disposition: A | Discharge status: Home / Self Care | Payer: Medicare Other | Source: Ambulatory Visit | Attending: Family | Admitting: Family

## 2014-03-22 ENCOUNTER — Encounter: Payer: Self-pay | Admitting: Family

## 2014-03-22 VITALS — BP 140/82 | HR 54 | Resp 14 | Ht 74.0 in | Wt 214.0 lb

## 2014-03-22 DIAGNOSIS — I6529 Occlusion and stenosis of unspecified carotid artery: Secondary | ICD-10-CM | POA: Diagnosis present

## 2014-03-22 DIAGNOSIS — Z48812 Encounter for surgical aftercare following surgery on the circulatory system: Secondary | ICD-10-CM | POA: Insufficient documentation

## 2014-03-22 NOTE — Patient Instructions (Signed)
Stroke Prevention Some medical conditions and behaviors are associated with an increased chance of having a stroke. You may prevent a stroke by making healthy choices and managing medical conditions. HOW CAN I REDUCE MY RISK OF HAVING A STROKE?   Stay physically active. Get at least 30 minutes of activity on most or all days.  Do not smoke. It may also be helpful to avoid exposure to secondhand smoke.  Limit alcohol use. Moderate alcohol use is considered to be:  No more than 2 drinks per day for men.  No more than 1 drink per day for nonpregnant women.  Eat healthy foods. This involves:  Eating 5 or more servings of fruits and vegetables a day.  Making dietary changes that address high blood pressure (hypertension), high cholesterol, diabetes, or obesity.  Manage your cholesterol levels.  Making food choices that are high in fiber and low in saturated fat, trans fat, and cholesterol may control cholesterol levels.  Take any prescribed medicines to control cholesterol as directed by your health care provider.  Manage your diabetes.  Controlling your carbohydrate and sugar intake is recommended to manage diabetes.  Take any prescribed medicines to control diabetes as directed by your health care provider.  Control your hypertension.  Making food choices that are low in salt (sodium), saturated fat, trans fat, and cholesterol is recommended to manage hypertension.  Take any prescribed medicines to control hypertension as directed by your health care provider.  Maintain a healthy weight.  Reducing calorie intake and making food choices that are low in sodium, saturated fat, trans fat, and cholesterol are recommended to manage weight.  Stop drug abuse.  Avoid taking birth control pills.  Talk to your health care provider about the risks of taking birth control pills if you are over 35 years old, smoke, get migraines, or have ever had a blood clot.  Get evaluated for sleep  disorders (sleep apnea).  Talk to your health care provider about getting a sleep evaluation if you snore a lot or have excessive sleepiness.  Take medicines only as directed by your health care provider.  For some people, aspirin or blood thinners (anticoagulants) are helpful in reducing the risk of forming abnormal blood clots that can lead to stroke. If you have the irregular heart rhythm of atrial fibrillation, you should be on a blood thinner unless there is a good reason you cannot take them.  Understand all your medicine instructions.  Make sure that other conditions (such as anemia or atherosclerosis) are addressed. SEEK IMMEDIATE MEDICAL CARE IF:   You have sudden weakness or numbness of the face, arm, or leg, especially on one side of the body.  Your face or eyelid droops to one side.  You have sudden confusion.  You have trouble speaking (aphasia) or understanding.  You have sudden trouble seeing in one or both eyes.  You have sudden trouble walking.  You have dizziness.  You have a loss of balance or coordination.  You have a sudden, severe headache with no known cause.  You have new chest pain or an irregular heartbeat. Any of these symptoms may represent a serious problem that is an emergency. Do not wait to see if the symptoms will go away. Get medical help at once. Call your local emergency services (911 in [place] Do not drive yourself to the hospital. Document Released: 08/13/2004 Document Revised: 11/20/2013 Document Reviewed: 01/06/2013 [employer] Patient Information 2015 [employer]. This information is not intended to replace advice given   to you by your health care provider. Make sure you discuss any questions you have with your health care provider.  

## 2014-03-22 NOTE — Addendum Note (Signed)
Addended by: Mena Goes on: 03/22/2014 03:08 PM   Modules accepted: Orders

## 2014-03-22 NOTE — Progress Notes (Signed)
Established Carotid Patient   History of Present Illness  Tanner [name] is a 79 y.o. male patient of [name] who is s/p left carotid endarterectomy on 02/13/2013 for severe asymptomatic disease.  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 He returns today for follow up. He had a TIA in 2011 or 2012 as manifested by transient lateral loss of vision in left eye, denies any other neurological symptoms, specifically denies hemiparesis, denies aphasia symptoms.   Patient states he was evaluated by [name], his current neurologist is [name].  He rarely feels thenumbness in his left arm that he was having. This is sounds to be mostly positional and can occur when he first arises in the morning. He also reports some soreness in his left neck. He is left-handed. He specifically denies any further transient ischemic attacks or stroke activity.  Pt reports New Medical or Surgical History: colonoscopy and endoscopy in Dec., 2014 and Jan., 2015 to evaluate GI bleed, has since started a PPI. He walks a great deal, denies claudication in legs with walking.  Pt Diabetic: No Pt smoker: former smoker, quit in 1978  Pt meds include: Statin : Yes ASA: Yes Other anticoagulants/antiplatelets: Plavix   Past Medical History  Diagnosis Date  . Allergy     seasonal- mostly spring  . TOBACCO ABUSE, HX OF 03/17/2010  . Overweight(278.02) 07/01/2010  . Personal history of other infectious and parasitic disease 03/17/2010  . Mixed hyperlipidemia 03/17/2010  . HYPERKALEMIA 05/27/2010  . FATIGUE 03/17/2010  . CHICKENPOX, HX OF 03/17/2010  . CAROTID ARTERY OCCLUSION, WITH INFARCTION 03/17/2010  . Hearing loss 11/04/2010    bilateral  . Anemia 02/03/2011  . Hypertension   . Tubular adenoma of colon 06/10/2011  . Hypothyroid 12/08/2011  . BCC (basal cell carcinoma) 12/08/2011  . Thrombocytopenia 04/05/2012  . ASTHMA, CHILDHOOD 03/17/2010    mostly as child  . Sleep apnea     uses  mouthpiece only  . CEREBROVASCULAR ACCIDENT 03/17/2010    partial loss of peripheral vision on left-"slight improved"  . Epistaxis 09/06/2013    Social History History  Substance Use Topics  . Smoking status: Former Smoker -- 2.00 packs/day for 20 years    Types: Cigarettes    Quit date: 07/19/1977  . Smokeless tobacco: Never Used  . Alcohol Use: 4.2 oz/week    7 Glasses of wine per week     Comment: dinner wine daily    Family History Family History  Problem Relation Age of Onset  . Alzheimer's disease Mother   . Emphysema Sister     cigarettes  . Heart disease Son   . Coronary artery disease Brother   . Heart attack Brother   . Other Brother     heart problems- from fathers side  . Cancer Brother   . Heart disease Brother 43    Heart disease before age 59  . Cancer Sister     gyn  . Heart disease Sister   . Bipolar disorder Daughter   . Obesity Daughter   . Heart disease Sister     Surgical History Past Surgical History  Procedure Laterality Date  . Tonsillectomy    . Pilonidal cyst excision  1968    removed  . Knee cartliage repair  1964, 1990    bilateral  . Rotator cuff repair      right  . Endarterectomy Left 02/13/2013    Procedure: ENDARTERECTOMY CAROTID;  Surgeon: Rosetta Posner, MD;  Location: [facility];  Service: Vascular;  Laterality: Left;  . Eus N/A 08/17/2013    Procedure: UPPER ENDOSCOPIC ULTRASOUND (EUS) LINEAR;  Surgeon: Milus Banister, MD;  Location: [facility];  Service: Endoscopy;  Laterality: N/A;  . Carotid endarterectomy Left February 13, 2013    cea    No Known Allergies  Current Outpatient Prescriptions  Medication Sig Dispense Refill  . aspirin EC 81 MG tablet Take 81 mg by mouth daily.      [name] b complex vitamins tablet Take 1 tablet by mouth daily.      . chlorthalidone (HYGROTON) 25 MG tablet Take 1 tablet (25 mg total) by mouth every morning.  90 tablet  1  . Cholecalciferol (VITAMIN D PO) Take 2,000 mg by mouth daily.       .  clopidogrel (PLAVIX) 75 MG tablet Take 1 tablet (75 mg total) by mouth daily with breakfast.  90 tablet  1  . Coenzyme Q10 (CO Q-10) 300 MG CAPS Take 1 capsule by mouth daily.      . Glucosamine-Chondroit-Vit C-Mn (GLUCOSAMINE 1500 COMPLEX PO) Take 1,500 mg by mouth daily.      [name] levothyroxine (SYNTHROID, LEVOTHROID) 75 MCG tablet Take 1 tablet (75 mcg total) by mouth daily.  90 tablet  1  . NON FORMULARY Take 45 mg by mouth daily. Study Drug: Pioglitazone, and a placebo.Patient is not diabetic he is involved in an active study to determine if this medication can help prevent stroke.      [name] OVER THE COUNTER MEDICATION Take 1 capsule by mouth daily. Mega red 500 mg once daily      . pantoprazole (PROTONIX) 40 MG tablet TAKE 1 TABLET (40 MG TOTAL) BY MOUTH DAILY.  90 tablet  1  . Probiotic Product (PROBIOTIC PO) Take 1 capsule by mouth daily.       . ramipril (ALTACE) 10 MG capsule Take 1 capsule (10 mg total) by mouth 2 (two) times daily.  180 capsule  1  . rosuvastatin (CRESTOR) 40 MG tablet Take 1 tablet (40 mg total) by mouth every evening.  90 tablet  1   No current facility-administered medications for this visit.    Review of Systems : See HPI for pertinent positives and negatives.  Physical Examination  Filed Vitals:   03/22/14 0944 03/22/14 0946  BP: 165/86 140/82  Pulse: 54 54  Resp:  14  Height:  6\' 2"  (1.88 m)  Weight:  214 lb (97.07 kg)  SpO2:  99%   Body mass index is 27.46 kg/(m^2).  General: WDWN male in NAD GAIT: normal Eyes: PERRLA Pulmonary:  Non-labored, CTAB, Negative  Rales, Negative rhonchi, & Negative wheezing.  Cardiac: regular Rhythm ,  Negative detected murmur.  VASCULAR EXAM Carotid Bruits Right Left   Negative Negative    Aorta is not palpable. Radial pulses are 2+ palpable and equal.  LE Pulses Right Left       POPLITEAL  not  palpable   not palpable       POSTERIOR TIBIAL  not palpable   not palpable        DORSALIS PEDIS      ANTERIOR TIBIAL 2+ palpable  2+ palpable     Gastrointestinal: soft, nontender, BS WNL, no r/g,  negative masses.  Musculoskeletal: Negative muscle atrophy/wasting. M/S 5/5 throughout, Extremities without ischemic changes.  Neurologic: A&O X 3; Appropriate Affect ; SENSATION ;normal;  Speech is normal CN 2-12 intact, Pain and light touch intact in extremities, Motor exam as listed above.   Non-Invasive Vascular Imaging CAROTID DUPLEX 03/22/2014   CEREBROVASCULAR DUPLEX EVALUATION    INDICATION: Carotid artery stenosis    PREVIOUS INTERVENTION(S): Left carotid endarterectomy 02/13/2014    DUPLEX EXAM:     RIGHT  LEFT  Peak Systolic Velocities (cm/s) End Diastolic Velocities (cm/s) Plaque LOCATION Peak Systolic Velocities (cm/s) End Diastolic Velocities (cm/s) Plaque  17 0  CCA PROXIMAL 153 34   43 0  CCA MID 100 30   8 0  CCA DISTAL 119 32 HM  6 0  ECA 113 12   0 0 occluded ICA PROXIMAL 139 39 HM  0 0 occluded ICA MID 102 36   0 0 occluded ICA DISTAL 117 40     ICA occlusion ICA / CCA Ratio (PSV) Carotid endarterectomy  Antegrade Vertebral Flow Antegrade  092 Brachial Systolic Pressure (mmHg) 330  Triphasic Brachial Artery Waveforms Triphasic    Plaque Morphology:  HM = Homogeneous, HT = Heterogeneous, CP = Calcific Plaque, SP = Smooth Plaque, IP = Irregular Plaque     ADDITIONAL FINDINGS:     IMPRESSION: Known right internal carotid artery occlusion. Right common carotid artery non-occluding thrombus/plaque present throughout. Left internal carotid artery is patent with history of carotid endarterectomy, minimal hyperplasia present at the proximal and distal patch without hemodynamically significant stenosis.    Compared to the previous exam:  Essentially unchanged since previous study on 09/19/2013.    Assessment: JADARION HALBIG is a 79 y.o. male who is  s/p left carotid endarterectomy on 02/13/2013 for severe asymptomatic disease.  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 He had a TIA in 2011 or 2012 as manifested by transient lateral loss of vision in left eye, no other neurological symptoms, no further TIA or stroke activity.  Carotid Duplex today demonstrates known right internal carotid artery occlusion, right common carotid artery non-occluding thrombus/plaque present throughout, left internal carotid artery is patent with history of carotid endarterectomy, minimal hyperplasia present at the proximal and distal patch without hemodynamically significant stenosis.  Essentially unchanged since previous study on 09/19/2013.  Plan: Follow-up in 1 year with Carotid Duplex scan.   I discussed in depth with the patient the nature of atherosclerosis, and emphasized the importance of maximal medical management including strict control of blood pressure, blood glucose, and lipid levels, obtaining regular exercise, and continued cessation of smoking.  The patient is aware that without maximal medical management the underlying atherosclerotic disease process will progress, limiting the benefit of any interventions. The patient was given information about stroke prevention and what symptoms should prompt the patient to seek immediate medical care. Thank you for allowing [place] to participate in this patient's care.  Clemon Chambers, RN, MSN, FNP-C [facility] Office: [facility] Physician: Oneida Alar  03/22/2014 9:29 AM

## 2014-03-29 ENCOUNTER — Encounter: Payer: Self-pay | Admitting: Neurology

## 2014-03-29 ENCOUNTER — Ambulatory Visit (INDEPENDENT_AMBULATORY_CARE_PROVIDER_SITE_OTHER): Payer: Medicare Other | Admitting: Neurology

## 2014-03-29 VITALS — BP 130/68 | HR 58 | Temp 98.0°F | Resp 16 | Wt 210.5 lb

## 2014-03-29 DIAGNOSIS — I63239 Cerebral infarction due to unspecified occlusion or stenosis of unspecified carotid arteries: Secondary | ICD-10-CM

## 2014-03-29 DIAGNOSIS — G459 Transient cerebral ischemic attack, unspecified: Secondary | ICD-10-CM

## 2014-03-29 DIAGNOSIS — Z9889 Other specified postprocedural states: Secondary | ICD-10-CM

## 2014-03-29 NOTE — Progress Notes (Signed)
NEUROLOGY FOLLOW UP OFFICE NOTE  Tanner MACEACHERN [id number]  HISTORY OF PRESENT ILLNESS: Tanner [name] is a 79 year old left-handed man with history of hypertension, hyperlipidemia, hypothyroidism, OSA and former smoker who follows up for right MCA/PCA watershed infarct, carotid artery disease with right ICA occlusion and status post left CEA.  UPDATE: He had a repeat carotid doppler performed 03/22/14, which shows that the left ICA is patent without hemodynamically significant stenosis.  He  Has not had numbness in the left arm for quite some time.  He is feeling well.  12/05/13 LABS:  TSH 5.02, Free T4 0.82,   HISTORY: In August 2011, he had a stroke, presenting as left visual field cut.  He had a right hemispheric stroke thought to be secondary to right ICA occlusion.  MRI of brain revealed an acute watershed infarct in the right PCA/MCA distributions.  CTA of the neck confirmed right common carotid artery occlusion at the origin.  The left ICA revealed approximately 50% stenosis just distal to the bulb.  CTA of the head revealed occluded right ICA with partial retrograde constitution from the left INCA via the PCA.  There was opacification of the right MCA and right ACA from the left ICA via the Acomm.  There was occlusion of the right PCA at its origin.  Hgb A1c at that time was 5.6 and LDL was 210.  He was placed on Plavix.  He initially followed up with [name] at [facility].  Follow up carotid dopplers revealed increased stenosis of the left ICA.  Carotid doppler from 01/03/13 revealed more than 70% stenosis involving the left bulb and left proximal ICA and 50-69% stenosis involving the left mid ICA, but without plaque.  Stenosis of left ECA and left VA noted.  CTA of the neck performed on 01/19/13 revealed 75-80% stenosis of the left ICA.  He underwent a left CEA on 02/13/13.  He continues to follow up with [name] from vascular surgery.  09/21/13 Carotid dopplers:  Patent left ICA CEA without  evidence of restenosis or hyperplasia. 09/28/13 Hgb A1c 5.7 08/14/13 LDL 74, fasting glucose 95 02/07/13 2D Echo:  LVEF 55-65%, no regional wall motion abnormalities, mild MVR, mildly dilated left atrium, mildly dilated right ventricle, mildly increased systolic pressure in pulmonary arteries (PA peak pressure 55mm Hg).  He currently takes Plavix 75mg , ASA 81mg , Crestor.  Of note, he previously would experience numbness and tingling of the left arm when he wakes up in the morning.  It involves the entire hand and arm radiating up to the shoulder.  It lasts only a couple of seconds.  He sleeps on his left side.  One time, he had numbness only in the 5th digit.  He denies neck pain or weakness.  He hasn't had this happen for a couple of weeks, however.  PAST MEDICAL HISTORY: Past Medical History  Diagnosis Date  . Allergy     seasonal- mostly spring  . TOBACCO ABUSE, HX OF 03/17/2010  . Overweight(278.02) 07/01/2010  . Personal history of other infectious and parasitic disease 03/17/2010  . Mixed hyperlipidemia 03/17/2010  . HYPERKALEMIA 05/27/2010  . FATIGUE 03/17/2010  . CHICKENPOX, HX OF 03/17/2010  . CAROTID ARTERY OCCLUSION, WITH INFARCTION 03/17/2010  . Hearing loss 11/04/2010    bilateral  . Anemia 02/03/2011  . Hypertension   . Tubular adenoma of colon 06/10/2011  . Hypothyroid 12/08/2011  . BCC (basal cell carcinoma) 12/08/2011  . Thrombocytopenia 04/05/2012  . ASTHMA, CHILDHOOD 03/17/2010  mostly as child  . Sleep apnea     uses mouthpiece only  . CEREBROVASCULAR ACCIDENT 03/17/2010    partial loss of peripheral vision on left-"slight improved"  . Epistaxis 09/06/2013    MEDICATIONS: Current Outpatient Prescriptions on File Prior to Visit  Medication Sig Dispense Refill  . aspirin EC 81 MG tablet Take 81 mg by mouth daily.      [name] b complex vitamins tablet Take 1 tablet by mouth daily.      . chlorthalidone (HYGROTON) 25 MG tablet Take 1 tablet (25 mg total) by mouth every morning.   90 tablet  1  . Cholecalciferol (VITAMIN D PO) Take 2,000 mg by mouth daily.       . clopidogrel (PLAVIX) 75 MG tablet Take 1 tablet (75 mg total) by mouth daily with breakfast.  90 tablet  1  . Coenzyme Q10 (CO Q-10) 300 MG CAPS Take 1 capsule by mouth daily.      . Glucosamine-Chondroit-Vit C-Mn (GLUCOSAMINE 1500 COMPLEX PO) Take 1,500 mg by mouth daily.      [name] levothyroxine (SYNTHROID, LEVOTHROID) 75 MCG tablet Take 1 tablet (75 mcg total) by mouth daily.  90 tablet  1  . OVER THE COUNTER MEDICATION Take 1 capsule by mouth daily. Mega red 500 mg once daily      . pantoprazole (PROTONIX) 40 MG tablet TAKE 1 TABLET (40 MG TOTAL) BY MOUTH DAILY.  90 tablet  1  . Probiotic Product (PROBIOTIC PO) Take 1 capsule by mouth daily.       . ramipril (ALTACE) 10 MG capsule Take 1 capsule (10 mg total) by mouth 2 (two) times daily.  180 capsule  1  . rosuvastatin (CRESTOR) 40 MG tablet Take 1 tablet (40 mg total) by mouth every evening.  90 tablet  1  . NON FORMULARY Take 45 mg by mouth daily. Study Drug: Pioglitazone, and a placebo.Patient is not diabetic he is involved in an active study to determine if this medication can help prevent stroke.       No current facility-administered medications on file prior to visit.    ALLERGIES: No Known Allergies  FAMILY HISTORY: Family History  Problem Relation Age of Onset  . Alzheimer's disease Mother   . Emphysema Sister     cigarettes  . Heart disease Son   . Coronary artery disease Brother   . Heart attack Brother   . Other Brother     heart problems- from fathers side  . Cancer Brother   . Heart disease Brother 28    Heart disease before age 45  . Cancer Sister     gyn  . Heart disease Sister   . Bipolar disorder Daughter   . Obesity Daughter   . Heart disease Sister   . Stroke Father     SOCIAL HISTORY: History   Social History  . Marital Status: Divorced    Spouse Name: N/A    Number of Children: 3  . Years of Education: 35yrs    Occupational History  . retired    Social History Main Topics  . Smoking status: Former Smoker -- 2.00 packs/day for 20 years    Types: Cigarettes    Quit date: 07/19/1977  . Smokeless tobacco: Never Used  . Alcohol Use: 4.2 oz/week    7 Glasses of wine per week     Comment: dinner wine daily  . Drug Use: No  . Sexual Activity: No   Other Topics Concern  .  Not on file   Social History Narrative   Pt lives at home alone.    Caffeine Use: very little    REVIEW OF SYSTEMS: Constitutional: No fevers, chills, or sweats, no generalized fatigue, change in appetite Eyes: No visual changes, double vision, eye pain Ear, nose and throat: No hearing loss, ear pain, nasal congestion, sore throat Cardiovascular: No chest pain, palpitations Respiratory:  No shortness of breath at rest or with exertion, wheezes GastrointestinaI: No nausea, vomiting, diarrhea, abdominal pain, fecal incontinence Genitourinary:  No dysuria, urinary retention or frequency Musculoskeletal:  No neck pain, back pain Integumentary: No rash, pruritus, skin lesions Neurological: as above Psychiatric: No depression, insomnia, anxiety Endocrine: No palpitations, fatigue, diaphoresis, mood swings, change in appetite, change in weight, increased thirst Hematologic/Lymphatic:  No anemia, purpura, petechiae. Allergic/Immunologic: no itchy/runny eyes, nasal congestion, recent allergic reactions, rashes  PHYSICAL EXAM: Filed Vitals:   03/29/14 0829  BP: 130/68  Pulse: 58  Temp: 98 F (36.7 C)  Resp: 16   General: No acute distress Head:  Normocephalic/atraumatic Neck: supple, no paraspinal tenderness, full range of motion Heart:  Regular rate and rhythm Lungs:  Clear to auscultation bilaterally Back: No paraspinal tenderness Neurological Exam: alert and oriented to person, place, and time. Attention span and concentration intact, recent and remote memory intact, fund of knowledge intact.  Speech fluent and not  dysarthric, language intact.  CN II-XII intact. Fundoscopic exam unremarkable without vessel changes, exudates, hemorrhages or papilledema.  Bulk and tone normal, muscle strength 5/5 throughout.  Sensation to light touch, temperature and vibration intact.  Deep tendon reflexes 2+ throughout, toes downgoing.  Finger to nose and heel to shin testing intact.  Gait normal, Romberg negative.  IMPRESSION: Right MCA/PCA watershed infarct, secondary to right ICA occlusion and right PCA occlusion. Asymptomatic left ICA disease status post left CEA, stable  PLAN: Continue Plavix and aspirin Continue statin therapy (LDL at goal <100) Blood pressure control with goal below 140/90 For sake of completeness, will check Hgb A1c. Exercise and Mediterranean diet Follow up carotid doppler in one year as per [name] Follow up in one year  Metta Clines, DO  CC: Penni Homans, MD

## 2014-03-29 NOTE — Patient Instructions (Signed)
1. Continue plavix and aspirin 2.  Continue cholesterol medication 3.  Blood pressure control 4.  Exercise and Mediterranean diet 5.  Follow up in one year following next carotid doppler.  Call with questions or concerns.

## 2014-04-10 ENCOUNTER — Encounter: Payer: Self-pay | Admitting: Family Medicine

## 2014-04-10 DIAGNOSIS — E039 Hypothyroidism, unspecified: Secondary | ICD-10-CM

## 2014-04-10 MED ORDER — PANTOPRAZOLE SODIUM 40 MG PO TBEC: 40.0000 mg | DELAYED_RELEASE_TABLET | Freq: Every day | ORAL | Status: DC

## 2014-04-10 MED ORDER — CLOPIDOGREL BISULFATE 75 MG PO TABS: 75.0000 mg | ORAL_TABLET | Freq: Every day | ORAL | Status: DC

## 2014-04-10 MED ORDER — CHLORTHALIDONE 25 MG PO TABS: 25.0000 mg | ORAL_TABLET | Freq: Every morning | ORAL | Status: DC

## 2014-04-10 MED ORDER — ROSUVASTATIN CALCIUM 40 MG PO TABS: 40.0000 mg | ORAL_TABLET | Freq: Every evening | ORAL | Status: DC

## 2014-04-10 MED ORDER — RAMIPRIL 10 MG PO CAPS: 10.0000 mg | ORAL_CAPSULE | Freq: Two times a day (BID) | ORAL | Status: DC

## 2014-04-10 MED ORDER — LEVOTHYROXINE SODIUM 75 MCG PO TABS: 75.0000 ug | ORAL_TABLET | Freq: Every day | ORAL | Status: DC

## 2014-04-17 ENCOUNTER — Telehealth: Payer: Self-pay | Admitting: Family Medicine

## 2014-04-17 DIAGNOSIS — E039 Hypothyroidism, unspecified: Secondary | ICD-10-CM

## 2014-04-17 MED ORDER — PANTOPRAZOLE SODIUM 40 MG PO TBEC: 40.0000 mg | DELAYED_RELEASE_TABLET | Freq: Every day | ORAL | Status: DC

## 2014-04-17 MED ORDER — LEVOTHYROXINE SODIUM 75 MCG PO TABS: 75.0000 ug | ORAL_TABLET | Freq: Every day | ORAL | Status: DC

## 2014-04-17 MED ORDER — CHLORTHALIDONE 25 MG PO TABS: 25.0000 mg | ORAL_TABLET | Freq: Every morning | ORAL | Status: DC

## 2014-04-17 MED ORDER — ROSUVASTATIN CALCIUM 40 MG PO TABS: 40.0000 mg | ORAL_TABLET | Freq: Every evening | ORAL | Status: DC

## 2014-04-17 MED ORDER — CLOPIDOGREL BISULFATE 75 MG PO TABS: 75.0000 mg | ORAL_TABLET | Freq: Every day | ORAL | Status: DC

## 2014-04-17 MED ORDER — RAMIPRIL 10 MG PO CAPS: 10.0000 mg | ORAL_CAPSULE | Freq: Two times a day (BID) | ORAL | Status: DC

## 2014-04-17 NOTE — Telephone Encounter (Signed)
Caller name: Phillard  Call back number:[phone] Pharmacy: [facility] mail order   Reason for call:  Pt is needing this sent to [facility] instead of the [facility].  He is almost out of crestor.  Thanks.  Call or send message via mychart to confirm request.    1-RAMIPRIL 10MG , 2-CHLOROTHALIDONE 25MG , 3-CLOPIROGREL 75MG  (Plavix),  4-LEVOTHYROXINE SODIUM 0.075MG ,  5- CRESTOR 40MG  6-PANTOPRAZOLE SODIUM 40MG 

## 2014-05-08 ENCOUNTER — Other Ambulatory Visit (INDEPENDENT_AMBULATORY_CARE_PROVIDER_SITE_OTHER): Payer: Medicare Other

## 2014-05-08 DIAGNOSIS — D649 Anemia, unspecified: Secondary | ICD-10-CM

## 2014-05-08 DIAGNOSIS — Z79899 Other long term (current) drug therapy: Secondary | ICD-10-CM

## 2014-05-08 DIAGNOSIS — E782 Mixed hyperlipidemia: Secondary | ICD-10-CM

## 2014-05-08 DIAGNOSIS — E875 Hyperkalemia: Secondary | ICD-10-CM

## 2014-05-08 DIAGNOSIS — Z125 Encounter for screening for malignant neoplasm of prostate: Secondary | ICD-10-CM

## 2014-05-08 DIAGNOSIS — E039 Hypothyroidism, unspecified: Secondary | ICD-10-CM

## 2014-05-08 DIAGNOSIS — I10 Essential (primary) hypertension: Secondary | ICD-10-CM

## 2014-05-08 LAB — HEPATIC FUNCTION PANEL
ALBUMIN: 3.7 g/dL (ref 3.5–5.2)
ALK PHOS: 54 U/L (ref 39–117)
ALT: 15 U/L (ref 0–53)
AST: 23 U/L (ref 0–37)
Bilirubin, Direct: 0.2 mg/dL (ref 0.0–0.3)
TOTAL PROTEIN: 6.9 g/dL (ref 6.0–8.3)
Total Bilirubin: 0.9 mg/dL (ref 0.2–1.2)

## 2014-05-08 LAB — TSH: TSH: 1.95 u[IU]/mL (ref 0.35–4.50)

## 2014-05-08 LAB — CBC
HCT: 41 % (ref 39.0–52.0)
HEMOGLOBIN: 13.5 g/dL (ref 13.0–17.0)
MCHC: 33 g/dL (ref 30.0–36.0)
MCV: 95.1 fl (ref 78.0–100.0)
PLATELETS: 189 10*3/uL (ref 150.0–400.0)
RBC: 4.32 Mil/uL (ref 4.22–5.81)
RDW: 14.9 % (ref 11.5–15.5)
WBC: 5.9 10*3/uL (ref 4.0–10.5)

## 2014-05-08 LAB — RENAL FUNCTION PANEL
Albumin: 3.7 g/dL (ref 3.5–5.2)
BUN: 17 mg/dL (ref 6–23)
CALCIUM: 9.6 mg/dL (ref 8.4–10.5)
CO2: 31 mEq/L (ref 19–32)
CREATININE: 1.3 mg/dL (ref 0.4–1.5)
Chloride: 104 mEq/L (ref 96–112)
GFR: 58.4 mL/min — ABNORMAL LOW (ref >60.00)
GLUCOSE: 87 mg/dL (ref 70–99)
Phosphorus: 3 mg/dL (ref 2.3–4.6)
Potassium: 4.3 mEq/L (ref 3.5–5.1)
Sodium: 141 mEq/L (ref 135–145)

## 2014-05-08 LAB — HEMOGLOBIN A1C: Hgb A1c MFr Bld: 5.9 % (ref 4.6–6.5)

## 2014-05-08 LAB — LIPID PANEL
CHOLESTEROL: 146 mg/dL (ref 0–200)
HDL: 49.5 mg/dL (ref >39.00)
LDL Cholesterol: 82 mg/dL (ref 0–99)
NonHDL: 96.5
TRIGLYCERIDES: 71 mg/dL (ref 0.0–149.0)
Total CHOL/HDL Ratio: 3
VLDL: 14.2 mg/dL (ref 0.0–40.0)

## 2014-05-08 LAB — T4, FREE: Free T4: 1.12 ng/dL (ref 0.60–1.60)

## 2014-05-08 LAB — PSA, MEDICARE: PSA: 0.39 ng/ml (ref 0.10–4.00)

## 2014-05-08 LAB — VITAMIN B12: Vitamin B-12: 614 pg/mL (ref 211–911)

## 2014-05-10 LAB — INTRINSIC FACTOR ANTIBODIES: Intrinsic Factor: NEGATIVE

## 2014-05-17 ENCOUNTER — Ambulatory Visit (INDEPENDENT_AMBULATORY_CARE_PROVIDER_SITE_OTHER): Payer: Medicare Other | Admitting: Family Medicine

## 2014-05-17 ENCOUNTER — Encounter: Payer: Self-pay | Admitting: Family Medicine

## 2014-05-17 VITALS — BP 132/72 | HR 54 | Temp 97.8°F | Ht 74.0 in | Wt 207.6 lb

## 2014-05-17 DIAGNOSIS — E782 Mixed hyperlipidemia: Secondary | ICD-10-CM

## 2014-05-17 DIAGNOSIS — E785 Hyperlipidemia, unspecified: Secondary | ICD-10-CM

## 2014-05-17 DIAGNOSIS — Z23 Encounter for immunization: Secondary | ICD-10-CM

## 2014-05-17 DIAGNOSIS — R739 Hyperglycemia, unspecified: Secondary | ICD-10-CM

## 2014-05-17 DIAGNOSIS — I639 Cerebral infarction, unspecified: Secondary | ICD-10-CM

## 2014-05-17 DIAGNOSIS — Z0000 Encounter for general adult medical examination without abnormal findings: Secondary | ICD-10-CM

## 2014-05-17 DIAGNOSIS — I10 Essential (primary) hypertension: Secondary | ICD-10-CM

## 2014-05-17 DIAGNOSIS — D649 Anemia, unspecified: Secondary | ICD-10-CM

## 2014-05-17 DIAGNOSIS — D696 Thrombocytopenia, unspecified: Secondary | ICD-10-CM

## 2014-05-17 DIAGNOSIS — I6350 Cerebral infarction due to unspecified occlusion or stenosis of unspecified cerebral artery: Secondary | ICD-10-CM

## 2014-05-17 DIAGNOSIS — E039 Hypothyroidism, unspecified: Secondary | ICD-10-CM

## 2014-05-17 NOTE — Progress Notes (Signed)
Patient ID: Tanner [name], male   DOB: [birth year], 79 y.o.   MRN: [id number] [facility] [id number] 1935-04-28 05/17/2014      Progress Note-Follow Up  Subjective  Chief Complaint  Chief Complaint  Patient presents with  . Annual Exam    medicare wellness  . Injections    prevnar    HPI  Patient is a 79 year old male in today for routine medical care. Patient is in today for annual wellness exam and to review his numerous medical concerns. He is generally feeling well. Will see his opthamologist on 07/03/14 no vision concerns. Had a sharp leg pain last night but it nas some itching and irritation in his ears at times none today. No neurologic c/o had a recent carotid doppler which showed stable disease that only requires medical management. Denies CP/palp/SOB/HA/congestion/fevers/GI or GU c/o. Taking meds as prescribed  Past Medical History  Diagnosis Date  . Allergy     seasonal- mostly spring  . TOBACCO ABUSE, HX OF 03/17/2010  . Overweight(278.02) 07/01/2010  . Personal history of other infectious and parasitic disease 03/17/2010  . Mixed hyperlipidemia 03/17/2010  . HYPERKALEMIA 05/27/2010  . FATIGUE 03/17/2010  . CHICKENPOX, HX OF 03/17/2010  . CAROTID ARTERY OCCLUSION, WITH INFARCTION 03/17/2010  . Hearing loss 11/04/2010    bilateral  . Anemia 02/03/2011  . Hypertension   . Tubular adenoma of colon 06/10/2011  . Hypothyroid 12/08/2011  . BCC (basal cell carcinoma) 12/08/2011  . Thrombocytopenia 04/05/2012  . ASTHMA, CHILDHOOD 03/17/2010    mostly as child  . Sleep apnea     uses mouthpiece only  . CEREBROVASCULAR ACCIDENT 03/17/2010    partial loss of peripheral vision on left-"slight improved"  . Epistaxis 09/06/2013    Past Surgical History  Procedure Laterality Date  . Tonsillectomy    . Pilonidal cyst excision  1968    removed  . Knee cartliage repair  1964, 1990    bilateral  . Rotator cuff repair      right  . Endarterectomy Left 02/13/2013     Procedure: ENDARTERECTOMY CAROTID;  Surgeon: Rosetta Posner, MD;  Location: [place];  Service: Vascular;  Laterality: Left;  . Eus N/A 08/17/2013    Procedure: UPPER ENDOSCOPIC ULTRASOUND (EUS) LINEAR;  Surgeon: Milus Banister, MD;  Location: [facility];  Service: Endoscopy;  Laterality: N/A;  . Carotid endarterectomy Left February 13, 2013    cea  . Colonoscopy  Dec. 2014  . Upper gi endoscopy  Jan. 2015    Family History  Problem Relation Age of Onset  . Alzheimer's disease Mother   . Emphysema Sister     cigarettes  . Heart disease Son   . Coronary artery disease Brother   . Heart attack Brother   . Other Brother     heart problems- from fathers side  . Cancer Brother   . Heart disease Brother 48    Heart disease before age 14  . Cancer Sister     gyn  . Heart disease Sister   . Bipolar disorder Daughter   . Obesity Daughter   . Heart disease Sister   . Stroke Father     History   Social History  . Marital Status: Divorced    Spouse Name: N/A    Number of Children: 3  . Years of Education: 59yrs   Occupational History  . retired    Social History Main Topics  . Smoking status: Former Smoker -- 2.00  packs/day for 20 years    Types: Cigarettes    Quit date: 07/19/1977  . Smokeless tobacco: Never Used  . Alcohol Use: 4.2 oz/week    7 Glasses of wine per week     Comment: dinner wine daily  . Drug Use: No  . Sexual Activity: No   Other Topics Concern  . Not on file   Social History Narrative   Pt lives at home alone.    Caffeine Use: very little    Current Outpatient Prescriptions on File Prior to Visit  Medication Sig Dispense Refill  . aspirin EC 81 MG tablet Take 81 mg by mouth daily.      [name] b complex vitamins tablet Take 1 tablet by mouth daily.      . chlorthalidone (HYGROTON) 25 MG tablet Take 1 tablet (25 mg total) by mouth every morning.  90 tablet  1  . Cholecalciferol (VITAMIN D PO) Take 2,000 mg by mouth daily.       . clopidogrel (PLAVIX) 75 MG  tablet Take 1 tablet (75 mg total) by mouth daily with breakfast.  90 tablet  1  . Coenzyme Q10 (CO Q-10) 300 MG CAPS Take 1 capsule by mouth daily.      . Glucosamine-Chondroit-Vit C-Mn (GLUCOSAMINE 1500 COMPLEX PO) Take 1,500 mg by mouth daily.      [name] levothyroxine (SYNTHROID, LEVOTHROID) 75 MCG tablet Take 1 tablet (75 mcg total) by mouth daily.  90 tablet  1  . NON FORMULARY Take 45 mg by mouth daily. Study Drug: Pioglitazone, and a placebo.Patient is not diabetic he is involved in an active study to determine if this medication can help prevent stroke.      [name] OVER THE COUNTER MEDICATION Take 1 capsule by mouth daily. Mega red 500 mg once daily      . pantoprazole (PROTONIX) 40 MG tablet Take 1 tablet (40 mg total) by mouth daily.  90 tablet  1  . Probiotic Product (PROBIOTIC PO) Take 1 capsule by mouth daily.       . ramipril (ALTACE) 10 MG capsule Take 1 capsule (10 mg total) by mouth 2 (two) times daily.  180 capsule  1  . rosuvastatin (CRESTOR) 40 MG tablet Take 1 tablet (40 mg total) by mouth every evening.  90 tablet  1   No current facility-administered medications on file prior to visit.    No Known Allergies  Review of Systems  Review of Systems  Constitutional: Negative for fever, chills and malaise/fatigue.  HENT: Negative for congestion, hearing loss and nosebleeds.   Eyes: Negative for discharge.  Respiratory: Negative for cough, sputum production, shortness of breath and wheezing.   Cardiovascular: Negative for chest pain, palpitations and leg swelling.  Gastrointestinal: Negative for heartburn, nausea, vomiting, abdominal pain, diarrhea, constipation and blood in stool.  Genitourinary: Negative for dysuria, urgency, frequency and hematuria.  Musculoskeletal: Negative for back pain, falls and myalgias.  Skin: Negative for rash.  Neurological: Negative for dizziness, tremors, sensory change, focal weakness, loss of consciousness, weakness and headaches.   Endo/Heme/Allergies: Negative for polydipsia. Does not bruise/bleed easily.  Psychiatric/Behavioral: Negative for depression and suicidal ideas. The patient is not nervous/anxious and does not have insomnia.     Objective  BP 145/74  Pulse 54  Temp(Src) 97.8 F (36.6 C) (Oral)  Ht 6\' 2"  (1.88 m)  Wt 207 lb 9.6 oz (94.167 kg)  BMI 26.64 kg/m2  SpO2 99%  Physical Exam  Physical Exam  Constitutional: He  is oriented to person, place, and time and well-developed, well-nourished, and in no distress. No distress.  HENT:  Head: Normocephalic and atraumatic.  Eyes: Conjunctivae are normal.  Neck: Neck supple. No thyromegaly present.  Cardiovascular: Normal rate, regular rhythm and normal heart sounds.   No murmur heard. Pulmonary/Chest: Effort normal and breath sounds normal. No respiratory distress.  Abdominal: He exhibits no distension and no mass. There is no tenderness.  Musculoskeletal: He exhibits no edema.  Neurological: He is alert and oriented to person, place, and time.  Skin: Skin is warm.  Psychiatric: Memory, affect and judgment normal.    Lab Results  Component Value Date   TSH 1.95 05/08/2014   Lab Results  Component Value Date   WBC 5.9 05/08/2014   HGB 13.5 05/08/2014   HCT 41.0 05/08/2014   MCV 95.1 05/08/2014   PLT 189.0 05/08/2014   Lab Results  Component Value Date   CREATININE 1.3 05/08/2014   BUN 17 05/08/2014   NA 141 05/08/2014   K 4.3 05/08/2014   CL 104 05/08/2014   CO2 31 05/08/2014   Lab Results  Component Value Date   ALT 15 05/08/2014   AST 23 05/08/2014   ALKPHOS 54 05/08/2014   BILITOT 0.9 05/08/2014   Lab Results  Component Value Date   CHOL 146 05/08/2014   Lab Results  Component Value Date   HDL 49.50 05/08/2014   Lab Results  Component Value Date   LDLCALC 82 05/08/2014   Lab Results  Component Value Date   TRIG 71.0 05/08/2014   Lab Results  Component Value Date   CHOLHDL 3 05/08/2014     Assessment &  Plan  Cerebral artery occlusion with cerebral infarction No recent illness or new symptoms continues in clinical trial  Essential hypertension, benign Well controlled, no changes to meds. Encouraged heart healthy diet such as the DASH diet and exercise as tolerated.   Hypothyroid On Levothyroxine, continue to monitor, numbers have stailized  Mixed hyperlipidemia Tolerating statin, encouraged heart healthy diet, avoid trans fats, minimize simple carbs and saturated fats. Increase exercise as tolerated  Anemia Increase leafy greens, consider increased lean red meat and using cast iron cookware. Continue to monitor, report any concerns  Thrombocytopenia Mild, asymptomatic  Medicare annual wellness visit, subsequent Patient denies any difficulties at home. No trouble with ADLs, depression or falls. No recent changes to vision or hearing. Is UTD with immunizations. Is UTD with screening. Discussed Advanced Directives, patient agrees to bring [place] copies of documents if can. Encouraged heart healthy diet, exercise as tolerated and adequate sleep. Dermatology Dr Allyson Sabal Dentist, Lorenda Cahill, DDS Gastroenterology,  Pyrtle, Ardis Hughs Opthamology Dr Satira Sark Sports Med Dr Tamala Julian Vascular surgery Dr Sherren Mocha Neurology Dr Tomi Likens Colonoscopy 1/15, repeat in 5 years if health permits,  Labs reviewed today repeat in 3-6 years.

## 2014-05-17 NOTE — Patient Instructions (Signed)
Stop Turmeric 64 oz of clear, magnesium Hyland's leg cramp med Salon Pas gel to sore area twice daily   Hypertension Hypertension, commonly called high blood pressure, is when the force of blood pumping through your arteries is too strong. Your arteries are the blood vessels that carry blood from your heart throughout your body. A blood pressure reading consists of a higher number over a lower number, such as 110/72. The higher number (systolic) is the pressure inside your arteries when your heart pumps. The lower number (diastolic) is the pressure inside your arteries when your heart relaxes. Ideally you want your blood pressure below 120/80. Hypertension forces your heart to work harder to pump blood. Your arteries may become narrow or stiff. Having hypertension puts you at risk for heart disease, stroke, and other problems.  RISK FACTORS Some risk factors for high blood pressure are controllable. Others are not.  Risk factors you cannot control include:   Race. You may be at higher risk if you are African American.  Age. Risk increases with age.  Gender. Men are at higher risk than women before age 24 years. After age 63, women are at higher risk than men. Risk factors you can control include:  Not getting enough exercise or physical activity.  Being overweight.  Getting too much fat, sugar, calories, or salt in your diet.  Drinking too much alcohol. SIGNS AND SYMPTOMS Hypertension does not usually cause signs or symptoms. Extremely high blood pressure (hypertensive crisis) may cause headache, anxiety, shortness of breath, and nosebleed. DIAGNOSIS  To check if you have hypertension, your health care provider will measure your blood pressure while you are seated, with your arm held at the level of your heart. It should be measured at least twice using the same arm. Certain conditions can cause a difference in blood pressure between your right and left arms. A blood pressure reading  that is higher than normal on one occasion does not mean that you need treatment. If one blood pressure reading is high, ask your health care provider about having it checked again. TREATMENT  Treating high blood pressure includes making lifestyle changes and possibly taking medicine. Living a healthy lifestyle can help lower high blood pressure. You may need to change some of your habits. Lifestyle changes may include:  Following the DASH diet. This diet is high in fruits, vegetables, and whole grains. It is low in salt, red meat, and added sugars.  Getting at least 2 hours of brisk physical activity every week.  Losing weight if necessary.  Not smoking.  Limiting alcoholic beverages.  Learning ways to reduce stress. If lifestyle changes are not enough to get your blood pressure under control, your health care provider may prescribe medicine. You may need to take more than one. Work closely with your health care provider to understand the risks and benefits. HOME CARE INSTRUCTIONS  Have your blood pressure rechecked as directed by your health care provider.   Take medicines only as directed by your health care provider. Follow the directions carefully. Blood pressure medicines must be taken as prescribed. The medicine does not work as well when you skip doses. Skipping doses also puts you at risk for problems.   Do not smoke.   Monitor your blood pressure at home as directed by your health care provider. SEEK MEDICAL CARE IF:   You think you are having a reaction to medicines taken.  You have recurrent headaches or feel dizzy.  You have swelling in your  ankles.  You have trouble with your vision. SEEK IMMEDIATE MEDICAL CARE IF:  You develop a severe headache or confusion.  You have unusual weakness, numbness, or feel faint.  You have severe chest or abdominal pain.  You vomit repeatedly.  You have trouble breathing. MAKE SURE YOU:   Understand these  instructions.  Will watch your condition.  Will get help right away if you are not doing well or get worse. Document Released: 07/06/2005 Document Revised: 11/20/2013 Document Reviewed: 04/28/2013 [facility] Patient Information 2015 [place], [place]. This information is not intended to replace advice given to you by your health care provider. Make sure you discuss any questions you have with your health care provider.

## 2014-05-17 NOTE — Progress Notes (Signed)
Pre visit review using our clinic review tool, if applicable. No additional management support is needed unless otherwise documented below in the visit note. 

## 2014-05-27 ENCOUNTER — Encounter: Payer: Self-pay | Admitting: Family Medicine

## 2014-05-27 DIAGNOSIS — Z0000 Encounter for general adult medical examination without abnormal findings: Secondary | ICD-10-CM | POA: Insufficient documentation

## 2014-05-27 NOTE — Assessment & Plan Note (Signed)
Well controlled, no changes to meds. Encouraged heart healthy diet such as the DASH diet and exercise as tolerated.  °

## 2014-05-27 NOTE — Assessment & Plan Note (Signed)
On Levothyroxine, continue to monitor, numbers have stailized

## 2014-05-27 NOTE — Assessment & Plan Note (Signed)
Tolerating statin, encouraged heart healthy diet, avoid trans fats, minimize simple carbs and saturated fats. Increase exercise as tolerated 

## 2014-05-27 NOTE — Assessment & Plan Note (Signed)
Increase leafy greens, consider increased lean red meat and using cast iron cookware. Continue to monitor, report any concerns 

## 2014-05-27 NOTE — Assessment & Plan Note (Signed)
Patient denies any difficulties at home. No trouble with ADLs, depression or falls. No recent changes to vision or hearing. Is UTD with immunizations. Is UTD with screening. Discussed Advanced Directives, patient agrees to bring [place] copies of documents if can. Encouraged heart healthy diet, exercise as tolerated and adequate sleep. Dermatology Dr Allyson Sabal Dentist, Lorenda Cahill, DDS Gastroenterology, [place] Pyrtle, Ardis Hughs Opthamology Dr Satira Sark Sports Med Dr Tamala Julian Vascular surgery Dr Sherren Mocha Neurology Dr Tomi Likens Colonoscopy 1/15, repeat in 5 years if health permits,  Labs reviewed today repeat in 3-6 years.

## 2014-05-27 NOTE — Assessment & Plan Note (Addendum)
Mild, asymptomatic, with leukopenia. 

## 2014-05-27 NOTE — Assessment & Plan Note (Signed)
No recent illness or new symptoms continues in clinical trial

## 2014-10-01 ENCOUNTER — Encounter: Payer: Self-pay | Admitting: Family Medicine

## 2014-10-02 ENCOUNTER — Other Ambulatory Visit: Payer: Self-pay | Admitting: Family Medicine

## 2014-10-02 DIAGNOSIS — E038 Other specified hypothyroidism: Secondary | ICD-10-CM

## 2014-10-02 MED ORDER — RAMIPRIL 10 MG PO CAPS: 10.0000 mg | ORAL_CAPSULE | Freq: Two times a day (BID) | ORAL | Status: DC

## 2014-10-02 MED ORDER — CHLORTHALIDONE 25 MG PO TABS: 25.0000 mg | ORAL_TABLET | Freq: Every morning | ORAL | Status: DC

## 2014-10-02 MED ORDER — CLOPIDOGREL BISULFATE 75 MG PO TABS: 75.0000 mg | ORAL_TABLET | Freq: Every day | ORAL | Status: DC

## 2014-10-02 MED ORDER — PANTOPRAZOLE SODIUM 40 MG PO TBEC: 40.0000 mg | DELAYED_RELEASE_TABLET | Freq: Every day | ORAL | Status: DC

## 2014-10-02 MED ORDER — LEVOTHYROXINE SODIUM 75 MCG PO TABS: 75.0000 ug | ORAL_TABLET | Freq: Every day | ORAL | Status: DC

## 2014-10-02 MED ORDER — ROSUVASTATIN CALCIUM 40 MG PO TABS: 40.0000 mg | ORAL_TABLET | Freq: Every evening | ORAL | Status: DC

## 2014-10-02 NOTE — Telephone Encounter (Signed)
Refills to [facility] per email request.

## 2014-11-13 ENCOUNTER — Other Ambulatory Visit (INDEPENDENT_AMBULATORY_CARE_PROVIDER_SITE_OTHER): Payer: Medicare Other

## 2014-11-13 DIAGNOSIS — R739 Hyperglycemia, unspecified: Secondary | ICD-10-CM

## 2014-11-13 DIAGNOSIS — I10 Essential (primary) hypertension: Secondary | ICD-10-CM | POA: Diagnosis not present

## 2014-11-13 DIAGNOSIS — E785 Hyperlipidemia, unspecified: Secondary | ICD-10-CM | POA: Diagnosis not present

## 2014-11-13 LAB — HEPATIC FUNCTION PANEL
ALT: 13 U/L (ref 0–53)
AST: 17 U/L (ref 0–37)
Albumin: 4.6 g/dL (ref 3.5–5.2)
Alkaline Phosphatase: 70 U/L (ref 39–117)
BILIRUBIN DIRECT: 0.2 mg/dL (ref 0.0–0.3)
TOTAL PROTEIN: 6.6 g/dL (ref 6.0–8.3)
Total Bilirubin: 0.9 mg/dL (ref 0.2–1.2)

## 2014-11-13 LAB — CBC
HEMATOCRIT: 40.9 % (ref 39.0–52.0)
Hemoglobin: 13.8 g/dL (ref 13.0–17.0)
MCHC: 33.7 g/dL (ref 30.0–36.0)
MCV: 93 fl (ref 78.0–100.0)
Platelets: 143 10*3/uL — ABNORMAL LOW (ref 150.0–400.0)
RBC: 4.4 Mil/uL (ref 4.22–5.81)
RDW: 15.2 % (ref 11.5–15.5)
WBC: 5 10*3/uL (ref 4.0–10.5)

## 2014-11-13 LAB — LIPID PANEL
CHOL/HDL RATIO: 3
Cholesterol: 145 mg/dL (ref 0–200)
HDL: 50.8 mg/dL (ref >39.00)
LDL Cholesterol: 73 mg/dL (ref 0–99)
NonHDL: 94.2
Triglycerides: 106 mg/dL (ref 0.0–149.0)
VLDL: 21.2 mg/dL (ref 0.0–40.0)

## 2014-11-13 LAB — RENAL FUNCTION PANEL
Albumin: 4.6 g/dL (ref 3.5–5.2)
BUN: 13 mg/dL (ref 6–23)
CO2: 32 mEq/L (ref 19–32)
CREATININE: 0.98 mg/dL (ref 0.40–1.50)
Calcium: 9.6 mg/dL (ref 8.4–10.5)
Chloride: 101 mEq/L (ref 96–112)
GFR: 78.66 mL/min (ref >60.00)
Glucose, Bld: 95 mg/dL (ref 70–99)
Phosphorus: 3 mg/dL (ref 2.3–4.6)
Potassium: 4.3 mEq/L (ref 3.5–5.1)
Sodium: 137 mEq/L (ref 135–145)

## 2014-11-13 LAB — TSH: TSH: 1.61 u[IU]/mL (ref 0.35–4.50)

## 2014-11-13 LAB — HEMOGLOBIN A1C: HEMOGLOBIN A1C: 5.8 % (ref 4.6–6.5)

## 2014-11-14 ENCOUNTER — Encounter: Payer: Self-pay | Admitting: Cardiology

## 2014-11-20 ENCOUNTER — Ambulatory Visit (INDEPENDENT_AMBULATORY_CARE_PROVIDER_SITE_OTHER): Payer: Medicare Other | Admitting: Family Medicine

## 2014-11-20 ENCOUNTER — Encounter: Payer: Self-pay | Admitting: Family Medicine

## 2014-11-20 VITALS — BP 120/78 | HR 59 | Temp 98.6°F | Ht 74.0 in | Wt 198.2 lb

## 2014-11-20 DIAGNOSIS — E663 Overweight: Secondary | ICD-10-CM | POA: Diagnosis not present

## 2014-11-20 DIAGNOSIS — E782 Mixed hyperlipidemia: Secondary | ICD-10-CM | POA: Diagnosis not present

## 2014-11-20 DIAGNOSIS — E038 Other specified hypothyroidism: Secondary | ICD-10-CM

## 2014-11-20 DIAGNOSIS — K1370 Unspecified lesions of oral mucosa: Secondary | ICD-10-CM

## 2014-11-20 DIAGNOSIS — K5900 Constipation, unspecified: Secondary | ICD-10-CM

## 2014-11-20 DIAGNOSIS — G4733 Obstructive sleep apnea (adult) (pediatric): Secondary | ICD-10-CM

## 2014-11-20 DIAGNOSIS — I10 Essential (primary) hypertension: Secondary | ICD-10-CM | POA: Diagnosis not present

## 2014-11-20 DIAGNOSIS — D696 Thrombocytopenia, unspecified: Secondary | ICD-10-CM

## 2014-11-20 DIAGNOSIS — I639 Cerebral infarction, unspecified: Secondary | ICD-10-CM

## 2014-11-20 DIAGNOSIS — I6350 Cerebral infarction due to unspecified occlusion or stenosis of unspecified cerebral artery: Secondary | ICD-10-CM

## 2014-11-20 DIAGNOSIS — L989 Disorder of the skin and subcutaneous tissue, unspecified: Secondary | ICD-10-CM

## 2014-11-20 HISTORY — DX: Disorder of the skin and subcutaneous tissue, unspecified: L98.9

## 2014-11-20 HISTORY — DX: Unspecified lesions of oral mucosa: K13.70

## 2014-11-20 NOTE — Patient Instructions (Addendum)
Rel of rec Dr Allyson Sabal office notes for past 2 years Rel of rec Dr Oneal Grout. Sleep study summer 2015   Call Dr Allyson Sabal to have lesion on right arm looked at.  Consider a 10 strain probiotic by [employer], order at [email or web address] Switch to Miralax with fiber dose needed daily if no improvement may need referral back to GI   Cholesterol Cholesterol is a white, waxy, fat-like substance needed by your body in small amounts. The liver makes all the cholesterol you need. Cholesterol is carried from the liver by the blood through the blood vessels. Deposits of cholesterol (plaque) may build up on blood vessel walls. These make the arteries narrower and stiffer. Cholesterol plaques increase the risk for heart attack and stroke.  You cannot feel your cholesterol level even if it is very high. The only way to know it is high is with a blood test. Once you know your cholesterol levels, you should keep a record of the test results. Work with your health care provider to keep your levels in the desired range.  WHAT DO THE RESULTS MEAN?  Total cholesterol is a rough measure of all the cholesterol in your blood.   LDL is the so-called bad cholesterol. This is the type that deposits cholesterol in the walls of the arteries. You want this level to be low.   HDL is the good cholesterol because it cleans the arteries and carries the LDL away. You want this level to be high.  Triglycerides are fat that the body can either burn for energy or store. High levels are closely linked to heart disease.  WHAT ARE THE DESIRED LEVELS OF CHOLESTEROL?  Total cholesterol below 200.   LDL below 100 for people at risk, below 70 for those at very high risk.   HDL above 50 is good, above 60 is best.   Triglycerides below 150.  HOW CAN I LOWER MY CHOLESTEROL?  Diet. Follow your diet programs as directed by your health care provider.   Choose fish or white meat chicken and [place], roasted or baked. Limit fatty  cuts of red meat, fried foods, and processed meats, such as sausage and lunch meats.   Eat lots of fresh fruits and vegetables.  Choose whole grains, beans, pasta, potatoes, and cereals.   Use only small amounts of olive, corn, or canola oils.   Avoid butter, mayonnaise, shortening, or palm kernel oils.  Avoid foods with trans fats.   Drink skim or nonfat milk and eat low-fat or nonfat yogurt and cheeses. Avoid whole milk, cream, ice cream, egg yolks, and full-fat cheeses.   Healthy desserts include angel food cake, ginger snaps, animal crackers, hard candy, popsicles, and low-fat or nonfat frozen yogurt. Avoid pastries, cakes, pies, and cookies.   Exercise. Follow your exercise programs as directed by your health care provider.   A regular program helps decrease LDL and raise HDL.   A regular program helps with weight control.   Do things that increase your activity level like gardening, walking, or taking the stairs. Ask your health care provider about how you can be more active in your daily life.   Medicine. Take medicine only as directed by your health care provider.   Medicine may be prescribed by your health care provider to help lower cholesterol and decrease the risk for heart disease.   If you have several risk factors, you may need medicine even if your levels are normal. Document Released: 03/31/2001 Document Revised: 11/20/2013 Document  Reviewed: 04/19/2013 [employer] Patient Information 2015 [place], [place]. This information is not intended to replace advice given to you by your health care provider. Make sure you discuss any questions you have with your health care provider.

## 2014-11-20 NOTE — Assessment & Plan Note (Signed)
On Levothyroxine, continue to monitor 

## 2014-11-20 NOTE — Assessment & Plan Note (Signed)
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

## 2014-11-20 NOTE — Assessment & Plan Note (Signed)
Is recurrent, mild, asymptomatic

## 2014-11-20 NOTE — Assessment & Plan Note (Signed)
Tolerating statin, encouraged heart healthy diet, avoid trans fats, minimize simple carbs and saturated fats. Increase exercise as tolerated 

## 2014-11-20 NOTE — Assessment & Plan Note (Signed)
Encouraged DASH diet, decrease po intake and increase exercise as tolerated. Needs 7-8 hours of sleep nightly. Avoid trans fats, eat small, frequent meals every 4-5 hours with lean proteins, complex carbs and healthy fats. Minimize simple carbs, has had good weight loss with efforts the last few months.

## 2014-11-20 NOTE — Progress Notes (Signed)
Tanner [name]  [id number] Aug 23, 1934 11/20/2014      Progress Note-Follow Up  Subjective  Chief Complaint  Chief Complaint  Patient presents with  . Follow-up    6 month    HPI  Patient is a 80 y.o. male in today for routine medical care. He generally feels well. He notes his constipation is still present to some degree, he will intermittently have small, hard stool. He has had no difficulty moving his bowels. He sees no blood. No bloody or tarry stool. Realizes his stool got harder when he stopped metamucil. Denies CP/palp/SOB/HA/congestion/fevers/GI or GU c/o. Taking meds as prescribed  Past Medical History  Diagnosis Date  . Allergy     seasonal- mostly spring  . TOBACCO ABUSE, HX OF 03/17/2010  . Overweight(278.02) 07/01/2010  . Personal history of other infectious and parasitic disease 03/17/2010  . Mixed hyperlipidemia 03/17/2010  . HYPERKALEMIA 05/27/2010  . FATIGUE 03/17/2010  . CHICKENPOX, HX OF 03/17/2010  . CAROTID ARTERY OCCLUSION, WITH INFARCTION 03/17/2010  . Hearing loss 11/04/2010    bilateral  . Anemia 02/03/2011  . Hypertension   . Tubular adenoma of colon 06/10/2011  . Hypothyroid 12/08/2011  . BCC (basal cell carcinoma) 12/08/2011  . Thrombocytopenia 04/05/2012  . ASTHMA, CHILDHOOD 03/17/2010    mostly as child  . Sleep apnea     uses mouthpiece only  . CEREBROVASCULAR ACCIDENT 03/17/2010    partial loss of peripheral vision on left-"slight improved"  . Epistaxis 09/06/2013    Past Surgical History  Procedure Laterality Date  . Tonsillectomy    . Pilonidal cyst excision  1968    removed  . Knee cartliage repair  1964, 1990    bilateral  . Rotator cuff repair      right  . Endarterectomy Left 02/13/2013    Procedure: ENDARTERECTOMY CAROTID;  Surgeon: Rosetta Posner, MD;  Location: [facility];  Service: Vascular;  Laterality: Left;  . Eus N/A 08/17/2013    Procedure: UPPER ENDOSCOPIC ULTRASOUND (EUS) LINEAR;  Surgeon: Milus Banister, MD;  Location: [facility];  Service: Endoscopy;  Laterality: N/A;  . Carotid endarterectomy Left February 13, 2013    cea  . Colonoscopy  Dec. 2014  . Upper gi endoscopy  Jan. 2015    Family History  Problem Relation Age of Onset  . Alzheimer's disease Mother   . Emphysema Sister     cigarettes  . Heart disease Son   . Coronary artery disease Brother   . Heart attack Brother   . Other Brother     heart problems- from fathers side  . Cancer Brother   . Heart disease Brother 52    Heart disease before age 35  . Cancer Sister     gyn  . Heart disease Sister   . Bipolar disorder Daughter   . Obesity Daughter   . Heart disease Sister   . Stroke Father     History   Social History  . Marital Status: Divorced    Spouse Name: N/A  . Number of Children: 3  . Years of Education: 54yrs   Occupational History  . retired    Social History Main Topics  . Smoking status: Former Smoker -- 2.00 packs/day for 20 years    Types: Cigarettes    Quit date: 07/19/1977  . Smokeless tobacco: Never Used  . Alcohol Use: 4.2 oz/week    7 Glasses of wine per week     Comment: dinner wine daily  .  Drug Use: No  . Sexual Activity: No   Other Topics Concern  . Not on file   Social History Narrative   Pt lives at home alone.    Caffeine Use: very little    Current Outpatient Prescriptions on File Prior to Visit  Medication Sig Dispense Refill  . aspirin EC 81 MG tablet Take 81 mg by mouth daily.    [name] b complex vitamins tablet Take 1 tablet by mouth daily.    . chlorthalidone (HYGROTON) 25 MG tablet Take 1 tablet (25 mg total) by mouth every morning. 90 tablet 1  . Cholecalciferol (VITAMIN D PO) Take 2,000 mg by mouth daily.     . clopidogrel (PLAVIX) 75 MG tablet Take 1 tablet (75 mg total) by mouth daily with breakfast. 90 tablet 1  . Coenzyme Q10 (CO Q-10) 300 MG CAPS Take 1 capsule by mouth daily.    . Glucosamine-Chondroit-Vit C-Mn (GLUCOSAMINE 1500 COMPLEX PO) Take 1,500 mg by mouth daily.    [name]  levothyroxine (SYNTHROID, LEVOTHROID) 75 MCG tablet Take 1 tablet (75 mcg total) by mouth daily. 90 tablet 1  . NON FORMULARY Take 45 mg by mouth daily. Study Drug: Pioglitazone, and a placebo.Patient is not diabetic he is involved in an active study to determine if this medication can help prevent stroke.    [name] OVER THE COUNTER MEDICATION Take 1 capsule by mouth daily. Mega red 500 mg once daily    . pantoprazole (PROTONIX) 40 MG tablet Take 1 tablet (40 mg total) by mouth daily. 90 tablet 1  . Probiotic Product (PROBIOTIC PO) Take 1 capsule by mouth daily.     . ramipril (ALTACE) 10 MG capsule Take 1 capsule (10 mg total) by mouth 2 (two) times daily. 180 capsule 1  . rosuvastatin (CRESTOR) 40 MG tablet Take 1 tablet (40 mg total) by mouth every evening. 90 tablet 1   No current facility-administered medications on file prior to visit.    No Known Allergies  Review of Systems  Review of Systems  Constitutional: Negative for fever and malaise/fatigue.  HENT: Negative for congestion.   Eyes: Negative for discharge.  Respiratory: Negative for shortness of breath.   Cardiovascular: Negative for chest pain, palpitations and leg swelling.  Gastrointestinal: Negative for nausea, abdominal pain and diarrhea.  Genitourinary: Negative for dysuria.  Musculoskeletal: Negative for falls.  Skin: Negative for rash.  Neurological: Negative for loss of consciousness and headaches.  Endo/Heme/Allergies: Negative for polydipsia.  Psychiatric/Behavioral: Negative for depression and suicidal ideas. The patient is not nervous/anxious and does not have insomnia.     Objective  BP 120/78 mmHg  Pulse 59  Temp(Src) 98.6 F (37 C) (Oral)  Ht 6\' 2"  (1.88 m)  Wt 198 lb 4 oz (89.926 kg)  BMI 25.44 kg/m2  SpO2 97%  Physical Exam  Physical Exam  Constitutional: He is oriented to person, place, and time and well-developed, well-nourished, and in no distress. No distress.  HENT:  Head: Normocephalic and  atraumatic.  Eyes: Conjunctivae are normal.  Neck: Neck supple. No thyromegaly present.  Cardiovascular: Normal rate, regular rhythm and normal heart sounds.   No murmur heard. Pulmonary/Chest: Effort normal and breath sounds normal. No respiratory distress.  Abdominal: He exhibits no distension and no mass. There is no tenderness.  Musculoskeletal: He exhibits no edema.  Neurological: He is alert and oriented to person, place, and time.  Skin: Skin is warm.  Psychiatric: Memory, affect and judgment normal.    Lab Results  Component Value Date   TSH 1.61 11/13/2014   Lab Results  Component Value Date   WBC 5.0 11/13/2014   HGB 13.8 11/13/2014   HCT 40.9 11/13/2014   MCV 93.0 11/13/2014   PLT 143.0* 11/13/2014   Lab Results  Component Value Date   CREATININE 0.98 11/13/2014   BUN 13 11/13/2014   NA 137 11/13/2014   K 4.3 11/13/2014   CL 101 11/13/2014   CO2 32 11/13/2014   Lab Results  Component Value Date   ALT 13 11/13/2014   AST 17 11/13/2014   ALKPHOS 70 11/13/2014   BILITOT 0.9 11/13/2014   Lab Results  Component Value Date   CHOL 145 11/13/2014   Lab Results  Component Value Date   HDL 50.80 11/13/2014   Lab Results  Component Value Date   LDLCALC 73 11/13/2014   Lab Results  Component Value Date   TRIG 106.0 11/13/2014   Lab Results  Component Value Date   CHOLHDL 3 11/13/2014     Assessment & Plan  Essential hypertension, benign Well controlled, no changes to meds. Encouraged heart healthy diet such as the DASH diet and exercise as tolerated.    Hypothyroid On Levothyroxine, continue to monitor   Mixed hyperlipidemia Tolerating statin, encouraged heart healthy diet, avoid trans fats, minimize simple carbs and saturated fats. Increase exercise as tolerated   Overweight Encouraged DASH diet, decrease po intake and increase exercise as tolerated. Needs 7-8 hours of sleep nightly. Avoid trans fats, eat small, frequent meals every 4-5  hours with lean proteins, complex carbs and healthy fats. Minimize simple carbs, has had good weight loss with efforts the last few months.   OSA (obstructive sleep apnea) Uses oral appliance nightly and sleeps well   Thrombocytopenia Is recurrent, mild, asymptomatic    Skin lesion of right arm Follows with Dr Allyson Sabal, likely an atypical SK but he will call dermatology to have it evaluated   Cerebral artery occlusion with cerebral infarction No recurrent episodes. Has completed his experimental trial    Constipation Encouraged to restart Metamucil, increase hydration, add probiotics and maintain a high fiber diet. May use Miralax prn.

## 2014-11-20 NOTE — Assessment & Plan Note (Signed)
Well controlled, no changes to meds. Encouraged heart healthy diet such as the DASH diet and exercise as tolerated.  °

## 2014-11-20 NOTE — Assessment & Plan Note (Signed)
Uses oral appliance nightly and sleeps well

## 2014-11-20 NOTE — Progress Notes (Signed)
Pre visit review using our clinic review tool, if applicable. No additional management support is needed unless otherwise documented below in the visit note. 

## 2014-12-05 ENCOUNTER — Other Ambulatory Visit: Payer: Medicare Other

## 2014-12-06 ENCOUNTER — Other Ambulatory Visit: Payer: Self-pay | Admitting: Family Medicine

## 2014-12-06 ENCOUNTER — Other Ambulatory Visit (INDEPENDENT_AMBULATORY_CARE_PROVIDER_SITE_OTHER): Payer: Medicare Other

## 2014-12-06 DIAGNOSIS — K625 Hemorrhage of anus and rectum: Secondary | ICD-10-CM

## 2014-12-06 LAB — FECAL OCCULT BLOOD, IMMUNOCHEMICAL: FECAL OCCULT BLD: NEGATIVE

## 2014-12-06 NOTE — Telephone Encounter (Signed)
IFOB entered. 

## 2014-12-08 ENCOUNTER — Encounter: Payer: Self-pay | Admitting: Family Medicine

## 2014-12-08 DIAGNOSIS — K5900 Constipation, unspecified: Secondary | ICD-10-CM

## 2014-12-08 HISTORY — DX: Constipation, unspecified: K59.00

## 2014-12-08 NOTE — Assessment & Plan Note (Signed)
Encouraged to restart Metamucil, increase hydration, add probiotics and maintain a high fiber diet. May use Miralax prn.

## 2014-12-08 NOTE — Assessment & Plan Note (Signed)
No recurrent episodes. Has completed his experimental trial

## 2015-03-29 ENCOUNTER — Other Ambulatory Visit: Payer: Self-pay | Admitting: Family Medicine

## 2015-04-01 ENCOUNTER — Encounter: Payer: Self-pay | Admitting: Family

## 2015-04-02 ENCOUNTER — Ambulatory Visit (HOSPITAL_COMMUNITY)
Admission: RE | Admit: 2015-04-02 | Discharge: 2015-04-02 | Disposition: A | Discharge status: Home / Self Care | Payer: Medicare Other | Source: Ambulatory Visit | Attending: Family | Admitting: Family

## 2015-04-02 ENCOUNTER — Encounter: Payer: Self-pay | Admitting: Family

## 2015-04-02 ENCOUNTER — Ambulatory Visit (INDEPENDENT_AMBULATORY_CARE_PROVIDER_SITE_OTHER): Payer: Medicare Other | Admitting: Family

## 2015-04-02 VITALS — BP 152/81 | HR 57 | Temp 97.5°F | Resp 14 | Ht 74.0 in | Wt 201.0 lb

## 2015-04-02 DIAGNOSIS — Z48812 Encounter for surgical aftercare following surgery on the circulatory system: Secondary | ICD-10-CM

## 2015-04-02 DIAGNOSIS — Z9889 Other specified postprocedural states: Secondary | ICD-10-CM

## 2015-04-02 DIAGNOSIS — Z87891 Personal history of nicotine dependence: Secondary | ICD-10-CM

## 2015-04-02 DIAGNOSIS — I6523 Occlusion and stenosis of bilateral carotid arteries: Secondary | ICD-10-CM

## 2015-04-02 DIAGNOSIS — I773 Arterial fibromuscular dysplasia: Secondary | ICD-10-CM | POA: Insufficient documentation

## 2015-04-02 DIAGNOSIS — I6521 Occlusion and stenosis of right carotid artery: Secondary | ICD-10-CM

## 2015-04-02 NOTE — Patient Instructions (Signed)
Stroke Prevention Some medical conditions and behaviors are associated with an increased chance of having a stroke. You may prevent a stroke by making healthy choices and managing medical conditions. HOW CAN I REDUCE MY RISK OF HAVING A STROKE?   Stay physically active. Get at least 30 minutes of activity on most or all days.  Do not smoke. It may also be helpful to avoid exposure to secondhand smoke.  Limit alcohol use. Moderate alcohol use is considered to be:  No more than 2 drinks per day for men.  No more than 1 drink per day for nonpregnant women.  Eat healthy foods. This involves:  Eating 5 or more servings of fruits and vegetables a day.  Making dietary changes that address high blood pressure (hypertension), high cholesterol, diabetes, or obesity.  Manage your cholesterol levels.  Making food choices that are high in fiber and low in saturated fat, trans fat, and cholesterol may control cholesterol levels.  Take any prescribed medicines to control cholesterol as directed by your health care provider.  Manage your diabetes.  Controlling your carbohydrate and sugar intake is recommended to manage diabetes.  Take any prescribed medicines to control diabetes as directed by your health care provider.  Control your hypertension.  Making food choices that are low in salt (sodium), saturated fat, trans fat, and cholesterol is recommended to manage hypertension.  Take any prescribed medicines to control hypertension as directed by your health care provider.  Maintain a healthy weight.  Reducing calorie intake and making food choices that are low in sodium, saturated fat, trans fat, and cholesterol are recommended to manage weight.  Stop drug abuse.  Avoid taking birth control pills.  Talk to your health care provider about the risks of taking birth control pills if you are over 35 years old, smoke, get migraines, or have ever had a blood clot.  Get evaluated for sleep  disorders (sleep apnea).  Talk to your health care provider about getting a sleep evaluation if you snore a lot or have excessive sleepiness.  Take medicines only as directed by your health care provider.  For some people, aspirin or blood thinners (anticoagulants) are helpful in reducing the risk of forming abnormal blood clots that can lead to stroke. If you have the irregular heart rhythm of atrial fibrillation, you should be on a blood thinner unless there is a good reason you cannot take them.  Understand all your medicine instructions.  Make sure that other conditions (such as anemia or atherosclerosis) are addressed. SEEK IMMEDIATE MEDICAL CARE IF:   You have sudden weakness or numbness of the face, arm, or leg, especially on one side of the body.  Your face or eyelid droops to one side.  You have sudden confusion.  You have trouble speaking (aphasia) or understanding.  You have sudden trouble seeing in one or both eyes.  You have sudden trouble walking.  You have dizziness.  You have a loss of balance or coordination.  You have a sudden, severe headache with no known cause.  You have new chest pain or an irregular heartbeat. Any of these symptoms may represent a serious problem that is an emergency. Do not wait to see if the symptoms will go away. Get medical help at once. Call your local emergency services (911 in [place] Do not drive yourself to the hospital. Document Released: 08/13/2004 Document Revised: 11/20/2013 Document Reviewed: 01/06/2013 [employer] Patient Information 2015 [employer]. This information is not intended to replace advice given   to you by your health care provider. Make sure you discuss any questions you have with your health care provider.  

## 2015-04-02 NOTE — Progress Notes (Signed)
Established Carotid Patient   History of Present Illness  Tanner [name] is a 80 y.o. male patient of [name] who is s/p left carotid endarterectomy on 02/13/2013 for severe asymptomatic disease.  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 He returns today for follow up. He had a TIA in 2011 or 2012 as manifested by transient lateral loss of vision in left eye, denies any other neurological symptoms, specifically denies hemiparesis, denies aphasia symptoms. He specifically denies any further transient ischemic attacks or stroke activity.  Patient states he was evaluated by [name], his current neurologist is [name].  He no longer feels the numbness in his left arm that he was having. This seems to be mostly positional and can occur when he first arises in the morning.  He is left-handed.   Pt reports New Medical or Surgical History: none. He denies claudication in legs with walking, denies non healing wounds.  Pt Diabetic: No Pt smoker: former smoker, quit in 1978  Pt meds include: Statin : Yes ASA: Yes Other anticoagulants/antiplatelets: Plavix   Past Medical History  Diagnosis Date  . Allergy     seasonal- mostly spring  . TOBACCO ABUSE, HX OF 03/17/2010  . Overweight(278.02) 07/01/2010  . Personal history of other infectious and parasitic disease 03/17/2010  . Mixed hyperlipidemia 03/17/2010  . HYPERKALEMIA 05/27/2010  . FATIGUE 03/17/2010  . CHICKENPOX, HX OF 03/17/2010  . CAROTID ARTERY OCCLUSION, WITH INFARCTION 03/17/2010  . Hearing loss 11/04/2010    bilateral  . Anemia 02/03/2011  . Hypertension   . Tubular adenoma of colon 06/10/2011  . Hypothyroid 12/08/2011  . BCC (basal cell carcinoma) 12/08/2011  . Thrombocytopenia 04/05/2012  . ASTHMA, CHILDHOOD 03/17/2010    mostly as child  . Sleep apnea     uses mouthpiece only  . CEREBROVASCULAR ACCIDENT 03/17/2010    partial loss of peripheral vision on left-"slight improved"  . Epistaxis  09/06/2013  . Skin lesion of right arm 11/20/2014  . Oral lesion 11/20/2014  . Constipation 12/08/2014    Social History Social History  Substance Use Topics  . Smoking status: Former Smoker -- 2.00 packs/day for 20 years    Types: Cigarettes    Quit date: 07/19/1977  . Smokeless tobacco: Never Used  . Alcohol Use: 4.2 oz/week    7 Glasses of wine per week     Comment: dinner wine daily    Family History Family History  Problem Relation Age of Onset  . Alzheimer's disease Mother   . Emphysema Sister     cigarettes  . Heart disease Son   . Coronary artery disease Brother   . Heart attack Brother   . Other Brother     heart problems- from fathers side  . Cancer Brother   . Heart disease Brother 35    Heart disease before age 22  . Cancer Sister     gyn  . Heart disease Sister   . Bipolar disorder Daughter   . Obesity Daughter   . Heart disease Sister   . Stroke Father     Surgical History Past Surgical History  Procedure Laterality Date  . Tonsillectomy    . Pilonidal cyst excision  1968    removed  . Knee cartliage repair  1964, 1990    bilateral  . Rotator cuff repair      right  . Endarterectomy Left 02/13/2013    Procedure: ENDARTERECTOMY CAROTID;  Surgeon: Rosetta Posner, MD;  Location: [facility];  Service: Vascular;  Laterality: Left;  . Eus N/A 08/17/2013    Procedure: UPPER ENDOSCOPIC ULTRASOUND (EUS) LINEAR;  Surgeon: Milus Banister, MD;  Location: [facility];  Service: Endoscopy;  Laterality: N/A;  . Carotid endarterectomy Left February 13, 2013    cea  . Colonoscopy  Dec. 2014  . Upper gi endoscopy  Jan. 2015    No Known Allergies  Current Outpatient Prescriptions  Medication Sig Dispense Refill  . aspirin EC 81 MG tablet Take 81 mg by mouth daily.    [name] b complex vitamins tablet Take 1 tablet by mouth daily.    . chlorthalidone (HYGROTON) 25 MG tablet Take 1 tablet by mouth  every morning 90 tablet 1  . Cholecalciferol (VITAMIN D PO) Take 2,000 mg by  mouth daily.     . clopidogrel (PLAVIX) 75 MG tablet Take 1 tablet by mouth  daily with breakfast 90 tablet 1  . Coenzyme Q10 (CO Q-10) 300 MG CAPS Take 1 capsule by mouth daily.    . CRESTOR 40 MG tablet Take 1 tablet by mouth  every evening 90 tablet 1  . Glucosamine-Chondroit-Vit C-Mn (GLUCOSAMINE 1500 COMPLEX PO) Take 1,500 mg by mouth daily.    [name] levothyroxine (SYNTHROID, LEVOTHROID) 75 MCG tablet Take 1 tablet by mouth  daily 90 tablet 1  . magnesium gluconate (MAGONATE) 500 MG tablet Take 500 mg by mouth daily.    . NON FORMULARY Take 45 mg by mouth daily. Study Drug: Pioglitazone, and a placebo.Patient is not diabetic he is involved in an active study to determine if this medication can help prevent stroke.    [name] OVER THE COUNTER MEDICATION Take 1 capsule by mouth daily. Mega red 500 mg once daily    . pantoprazole (PROTONIX) 40 MG tablet Take 1 tablet by mouth  daily 90 tablet 1  . Probiotic Product (PROBIOTIC PO) Take 1 capsule by mouth daily.     . ramipril (ALTACE) 10 MG capsule Take 1 capsule by mouth two times daily 180 capsule 1   No current facility-administered medications for this visit.    Review of Systems : See HPI for pertinent positives and negatives.  Physical Examination  Filed Vitals:   04/02/15 0959 04/02/15 1001 04/02/15 1004  BP: 143/60 156/81 152/81  Pulse: 60 57 57  Temp: 97.5 F (36.4 C)    Resp: 14    Height: 6\' 2"  (1.88 m)    Weight: 201 lb (91.173 kg)    SpO2: 100%     Body mass index is 25.8 kg/(m^2).   General: WDWN male in NAD GAIT: normal Eyes: PERRLA Pulmonary: Non-labored, CTAB, no rales, no rhonchi, & no wheezing.  Cardiac: regular rhythm, no detected murmur.  VASCULAR EXAM Carotid Bruits Right Left   positive Negative   Aorta is not palpable. Radial pulses are 2+ palpable and equal.      LE  Pulses Right Left   POPLITEAL not palpable  not palpable   POSTERIOR TIBIAL not palpable  2+ palpable    DORSALIS PEDIS  ANTERIOR TIBIAL 2+ palpable  2+ palpable     Gastrointestinal: soft, nontender, BS WNL, no r/g, no palpable masses.  Musculoskeletal: Negative muscle atrophy/wasting. M/S 5/5 throughout, Extremities without ischemic changes.  Neurologic: A&O X 3; Appropriate Affect, Speech is normal CN 2-12 intact, Pain and light touch intact in extremities, Motor exam as listed above.          Non-Invasive Vascular Imaging CAROTID DUPLEX 04/02/2015  CEREBROVASCULAR DUPLEX EVALUATION    INDICATION: Carotid artery disease     PREVIOUS INTERVENTION(S): Left carotid endarterectomy 02/13/2014.    DUPLEX EXAM:     RIGHT  LEFT  Peak Systolic Velocities (cm/s) End Diastolic Velocities (cm/s) Plaque LOCATION Peak Systolic Velocities (cm/s) End Diastolic Velocities (cm/s) Plaque  48 0  CCA PROXIMAL 140 33   31 0  CCA MID 89 20   7 0  CCA DISTAL 97 30 HM  33 0  ECA 83 15   0  Occluded ICA PROXIMAL 100 22 HM  0  Occluded ICA MID 119 42   0  Occluded  ICA DISTAL 99 39     NA ICA / CCA Ratio (PSV) NA  Antegrade  Vertebral Flow Antegrade   858 Brachial Systolic Pressure (mmHg) 850  Multiphasic (Subclavian artery) Brachial Artery Waveforms Multiphasic (Subclavian artery)    Plaque Morphology:  HM = Homogeneous, HT = Heterogeneous, CP = Calcific Plaque, SP = Smooth Plaque, IP = Irregular Plaque  ADDITIONAL FINDINGS:     IMPRESSION: Known occlusion of the right internal carotid artery. Right common carotid artery non-occluding thrombus/plaque present throughout. Patent left carotid endarterectomy site with evidence of mild hyperplasia at the proximal and distal patch.     Compared to the previous exam:  No significant change in comparison to the last exam on 03/22/2014.      Assessment: Tanner [name] is a 80 y.o. male who is s/p left  carotid endarterectomy on 02/13/2013 for severe asymptomatic disease.  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 He had a TIA in 2011 or 2012 as manifested by transient lateral loss of vision in left eye, no other neurological symptoms, no further TIA or stroke activity. Today's carotid Duplex suggests known occlusion of the right internal carotid artery. Right common carotid artery non-occluding thrombus/plaque present throughout. Patent left carotid endarterectomy site with evidence of mild hyperplasia at the proximal and distal patch. No significant change in comparison to the last exam on 03/22/2014.    Plan: Follow-up in 1 year with Carotid Duplex.   I discussed in depth with the patient the nature of atherosclerosis, and emphasized the importance of maximal medical management including strict control of blood pressure, blood glucose, and lipid levels, obtaining regular exercise, and continued cessation of smoking.  The patient is aware that without maximal medical management the underlying atherosclerotic disease process will progress, limiting the benefit of any interventions. The patient was given information about stroke prevention and what symptoms should prompt the patient to seek immediate medical care. Thank you for allowing [place] to participate in this patient's care.  Clemon Chambers, RN, MSN, FNP-C [facility] Office: [phone]  Clinic Physician: Bridgett Larsson  04/02/2015 9:27 AM

## 2015-04-02 NOTE — Progress Notes (Signed)
Filed Vitals:   04/02/15 0959 04/02/15 1001 04/02/15 1004  BP: 143/60 156/81 152/81  Pulse: 60 57 57  Temp: 97.5 F (36.4 C)    Resp: 14    Height: 6\' 2"  (1.88 m)    Weight: 201 lb (91.173 kg)    SpO2: 100%

## 2015-04-04 ENCOUNTER — Encounter: Payer: Self-pay | Admitting: Neurology

## 2015-04-04 ENCOUNTER — Ambulatory Visit (INDEPENDENT_AMBULATORY_CARE_PROVIDER_SITE_OTHER): Payer: Medicare Other | Admitting: Neurology

## 2015-04-04 VITALS — BP 138/74 | HR 57 | Ht 74.0 in | Wt 199.2 lb

## 2015-04-04 DIAGNOSIS — I10 Essential (primary) hypertension: Secondary | ICD-10-CM | POA: Diagnosis not present

## 2015-04-04 DIAGNOSIS — Z9889 Other specified postprocedural states: Secondary | ICD-10-CM

## 2015-04-04 DIAGNOSIS — R42 Dizziness and giddiness: Secondary | ICD-10-CM

## 2015-04-04 DIAGNOSIS — I63231 Cerebral infarction due to unspecified occlusion or stenosis of right carotid arteries: Secondary | ICD-10-CM | POA: Diagnosis not present

## 2015-04-04 DIAGNOSIS — E782 Mixed hyperlipidemia: Secondary | ICD-10-CM

## 2015-04-04 NOTE — Progress Notes (Signed)
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  History obtained by patient and recent vascular surgery note.  Recent carotid doppler report and labs reviewed.  UPDATE: He followed up with vascular surgery earlier this week.  Everything is going well. 04/02/15 Carotid doppler:  Right ICA occluded.  Left ICA patent.  No plaque. 11/13/14 cholesterol 145, TG 106, HDL 50.80, LDL 73, Hgb A1c 5.8.  He reports occasional episodes of dizziness.  It usually occurs when after a meal when he is outside in the head with the sun shining.  He will suddenly feel lightheaded.  It lasts only 2 to 3 seconds.  There is no associated vision loss, slurred speech or focal numbness or weakness.  Occasionally, it can occur when he stands up.  This has been ongoing for a while and it is not affecting his quality of life, but he thought he should mention it.  HISTORY: In August 2011, he had a stroke, presenting as left visual field cut.  He had a right hemispheric stroke thought to be secondary to right ICA occlusion.  MRI of brain revealed an acute watershed infarct in the right PCA/MCA distributions.  CTA of the neck confirmed right common carotid artery occlusion at the origin.  The left ICA revealed approximately 50% stenosis just distal to the bulb.  CTA of the head revealed occluded right ICA with partial retrograde constitution from the left INCA via the PCA.  There was opacification of the right MCA and right ACA from the left ICA via the Acomm.  There was occlusion of the right PCA at its origin.  Hgb A1c at that time was 5.6 and LDL was 210.  He was placed on Plavix.  He initially followed up with [name] at [facility].   Follow up carotid dopplers revealed increased stenosis of the left ICA.  Carotid doppler from 01/03/13 revealed more than 70% stenosis involving the left bulb and left proximal ICA and 50-69% stenosis involving the left mid ICA, but without plaque.  Stenosis of left ECA and left VA noted.  CTA of the neck performed on 01/19/13 revealed 75-80% stenosis of the left ICA.  He underwent a left CEA on 02/13/13.  He continues to follow up with [name] from vascular surgery.   03/22/14 Carotid doppler:   the left ICA is patent without hemodynamically significant stenosis. 09/21/13 Carotid dopplers:  Patent left ICA CEA without evidence of restenosis or hyperplasia. 09/28/13 Hgb A1c 5.7 08/14/13 LDL 74, fasting glucose 95 02/07/13 2D Echo:  LVEF 55-65%, no regional wall motion abnormalities, mild MVR, mildly dilated left atrium, mildly dilated right ventricle, mildly increased systolic pressure in pulmonary arteries (PA peak pressure 44mm Hg).  He currently takes Plavix 75mg , ASA 81mg , Crestor.[name]  PAST MEDICAL HISTORY: Past Medical History  Diagnosis Date  . Allergy     seasonal- mostly spring  . TOBACCO ABUSE, HX OF 03/17/2010  . Overweight(278.02) 07/01/2010  . Personal history of other infectious and parasitic disease 03/17/2010  . Mixed hyperlipidemia 03/17/2010  . HYPERKALEMIA 05/27/2010  . FATIGUE 03/17/2010  . CHICKENPOX, HX OF 03/17/2010  . CAROTID ARTERY OCCLUSION, WITH INFARCTION 03/17/2010  . Hearing loss 11/04/2010    bilateral  . Anemia 02/03/2011  .  Hypertension   . Tubular adenoma of colon 06/10/2011  . Hypothyroid 12/08/2011  . BCC (basal cell carcinoma) 12/08/2011  . Thrombocytopenia 04/05/2012  . ASTHMA, CHILDHOOD 03/17/2010    mostly as child  . Sleep apnea     uses mouthpiece only  . CEREBROVASCULAR ACCIDENT 03/17/2010    partial loss of peripheral vision on left-"slight improved"  . Epistaxis 09/06/2013  . Skin lesion of right arm 11/20/2014  . Oral lesion 11/20/2014  . Constipation 12/08/2014      MEDICATIONS: Current Outpatient Prescriptions on File Prior to Visit  Medication Sig Dispense Refill  . aspirin EC 81 MG tablet Take 81 mg by mouth daily.    [name] b complex vitamins tablet Take 1 tablet by mouth daily.    . chlorthalidone (HYGROTON) 25 MG tablet Take 1 tablet by mouth  every morning 90 tablet 1  . Cholecalciferol (VITAMIN D PO) Take 2,000 mg by mouth daily.     . clopidogrel (PLAVIX) 75 MG tablet Take 1 tablet by mouth  daily with breakfast 90 tablet 1  . Coenzyme Q10 (CO Q-10) 300 MG CAPS Take 1 capsule by mouth daily.    . CRESTOR 40 MG tablet Take 1 tablet by mouth  every evening 90 tablet 1  . Glucosamine-Chondroit-Vit C-Mn (GLUCOSAMINE 1500 COMPLEX PO) Take 1,500 mg by mouth daily.    [name] levothyroxine (SYNTHROID, LEVOTHROID) 75 MCG tablet Take 1 tablet by mouth  daily 90 tablet 1  . magnesium gluconate (MAGONATE) 500 MG tablet Take 500 mg by mouth daily.    . NON FORMULARY Take 45 mg by mouth daily. Study Drug: Pioglitazone, and a placebo.Patient is not diabetic he is involved in an active study to determine if this medication can help prevent stroke.    [name] OVER THE COUNTER MEDICATION Take 1 capsule by mouth daily. Mega red 500 mg once daily    . pantoprazole (PROTONIX) 40 MG tablet Take 1 tablet by mouth  daily 90 tablet 1  . Probiotic Product (PROBIOTIC PO) Take 1 capsule by mouth daily.     . ramipril (ALTACE) 10 MG capsule Take 1 capsule by mouth two times daily 180 capsule 1   No current facility-administered medications on file prior to visit.    ALLERGIES: No Known Allergies  FAMILY HISTORY: Family History  Problem Relation Age of Onset  . Alzheimer's disease Mother   . Emphysema Sister     cigarettes  . Heart disease Son   . Coronary artery disease Brother   . Heart attack Brother   . Other Brother     heart problems- from fathers side  . Cancer Brother   . Heart disease Brother 23    Heart disease before age 28  . Cancer Sister     gyn  . Heart  disease Sister   . Bipolar disorder Daughter   . Obesity Daughter   . Heart disease Sister   . Stroke Father     SOCIAL HISTORY: Social History   Social History  . Marital Status: Divorced    Spouse Name: N/A  . Number of Children: 3  . Years of Education: 13yrs   Occupational History  . retired    Social History Main Topics  . Smoking status: Former Smoker -- 2.00 packs/day for 20 years    Types: Cigarettes    Quit date: 07/19/1977  . Smokeless tobacco: Never Used  . Alcohol Use: 4.2 oz/week    7 Glasses of wine per week  Comment: dinner wine daily  . Drug Use: No  . Sexual Activity: No   Other Topics Concern  . Not on file   Social History Narrative   Pt lives at home alone.    Caffeine Use: very little    REVIEW OF SYSTEMS: Constitutional: No fevers, chills, or sweats, no generalized fatigue, change in appetite Eyes: No visual changes, double vision, eye pain Ear, nose and throat: No hearing loss, ear pain, nasal congestion, sore throat Cardiovascular: No chest pain, palpitations Respiratory:  No shortness of breath at rest or with exertion, wheezes GastrointestinaI: No nausea, vomiting, diarrhea, abdominal pain, fecal incontinence Genitourinary:  No dysuria, urinary retention or frequency Musculoskeletal:  No neck pain, back pain Integumentary: No rash, pruritus, skin lesions Neurological: as above Psychiatric: No depression, insomnia, anxiety Endocrine: No palpitations, fatigue, diaphoresis, mood swings, change in appetite, change in weight, increased thirst Hematologic/Lymphatic:  No anemia, purpura, petechiae. Allergic/Immunologic: no itchy/runny eyes, nasal congestion, recent allergic reactions, rashes  PHYSICAL EXAM: Filed Vitals:   04/04/15 0939  BP: 138/74  Pulse: 57   General: No acute distress.  Patient appears well-groomed.  Head:  Normocephalic/atraumatic Eyes:  Fundoscopic exam unremarkable without vessel changes, exudates, hemorrhages  or papilledema. Neck: supple, no paraspinal tenderness, full range of motion Heart:  Regular rate and rhythm Lungs:  Clear to auscultation bilaterally Back: No paraspinal tenderness Neurological Exam: alert and oriented to person, place, and time. Attention span and concentration intact, recent and remote memory intact, fund of knowledge intact.  Speech fluent and not dysarthric, language intact.  Left homonymous hemianopsia.  Otherwise, CN II-XII intact. Fundoscopic exam unremarkable without vessel changes, exudates, hemorrhages or papilledema.  Bulk and tone normal, muscle strength 5/5 throughout.  Sensation to pinprick intact.  Vibration sensation reduced in the feet.  Deep tendon reflexes 2+ throughout, toes downgoing.  Finger to nose and heel to shin testing intact.  Gait normal, Romberg negative.  IMPRESSION: Right MCA/PCA watershed infarct, secondary to right ICA occlusion and right PCA occlusion. Asymptomatic left ICA disease status post left CEA, stable Hyperlipidemia Lightheadedness.  Consider autonomic dysfunction (he describes orthostatic symptoms following eating while in the head or upon standing up) Hypertension  PLAN: Continue ASA 81mg  and Plavix for secondary stroke prevention. Continue Crestor 40mg  Blood pressure control.  Should be rechecked with PCP. Exercise, such as walks (advised to do it indoors and out of the heat, such as [facility] Take time standing up. Follow up in one year after follow up carotid doppler  31 minutes spent face to face with patient, over 50% spent discussing management.   Metta Clines, DO  CC:  Penni Homans, MD

## 2015-04-08 ENCOUNTER — Encounter: Payer: Self-pay | Admitting: Family Medicine

## 2015-05-28 ENCOUNTER — Other Ambulatory Visit (INDEPENDENT_AMBULATORY_CARE_PROVIDER_SITE_OTHER): Payer: Medicare Other

## 2015-05-28 DIAGNOSIS — E782 Mixed hyperlipidemia: Secondary | ICD-10-CM | POA: Diagnosis not present

## 2015-05-28 DIAGNOSIS — I10 Essential (primary) hypertension: Secondary | ICD-10-CM

## 2015-05-28 LAB — COMPREHENSIVE METABOLIC PANEL
ALK PHOS: 63 U/L (ref 39–117)
ALT: 18 U/L (ref 0–53)
AST: 21 U/L (ref 0–37)
Albumin: 4.6 g/dL (ref 3.5–5.2)
BUN: 16 mg/dL (ref 6–23)
CO2: 30 meq/L (ref 19–32)
Calcium: 9.6 mg/dL (ref 8.4–10.5)
Chloride: 100 mEq/L (ref 96–112)
Creatinine, Ser: 1.05 mg/dL (ref 0.40–1.50)
GFR: 72.54 mL/min (ref >60.00)
GLUCOSE: 99 mg/dL (ref 70–99)
POTASSIUM: 4.7 meq/L (ref 3.5–5.1)
Sodium: 137 mEq/L (ref 135–145)
TOTAL PROTEIN: 7.1 g/dL (ref 6.0–8.3)
Total Bilirubin: 0.8 mg/dL (ref 0.2–1.2)

## 2015-05-28 LAB — CBC
HCT: 44.1 % (ref 39.0–52.0)
HEMOGLOBIN: 14.5 g/dL (ref 13.0–17.0)
MCHC: 32.9 g/dL (ref 30.0–36.0)
MCV: 95.7 fl (ref 78.0–100.0)
PLATELETS: 169 10*3/uL (ref 150.0–400.0)
RBC: 4.61 Mil/uL (ref 4.22–5.81)
RDW: 14.9 % (ref 11.5–15.5)
WBC: 6.6 10*3/uL (ref 4.0–10.5)

## 2015-05-28 LAB — LIPID PANEL
CHOL/HDL RATIO: 3
Cholesterol: 157 mg/dL (ref 0–200)
HDL: 46.3 mg/dL (ref >39.00)
LDL CALC: 88 mg/dL (ref 0–99)
NONHDL: 110.65
Triglycerides: 115 mg/dL (ref 0.0–149.0)
VLDL: 23 mg/dL (ref 0.0–40.0)

## 2015-05-28 LAB — TSH: TSH: 2.67 u[IU]/mL (ref 0.35–4.50)

## 2015-06-03 ENCOUNTER — Telehealth: Payer: Self-pay | Admitting: Behavioral Health

## 2015-06-03 ENCOUNTER — Encounter: Payer: Self-pay | Admitting: Behavioral Health

## 2015-06-03 NOTE — Telephone Encounter (Signed)
Pre-Visit Call completed with patient and chart updated.   Pre-Visit Info documented in Specialty Comments under [employer].    

## 2015-06-04 ENCOUNTER — Encounter: Payer: Self-pay | Admitting: Family Medicine

## 2015-06-04 ENCOUNTER — Ambulatory Visit (INDEPENDENT_AMBULATORY_CARE_PROVIDER_SITE_OTHER): Payer: Medicare Other | Admitting: Family Medicine

## 2015-06-04 VITALS — BP 142/80 | HR 63 | Temp 98.2°F | Ht 74.0 in | Wt 204.0 lb

## 2015-06-04 DIAGNOSIS — E782 Mixed hyperlipidemia: Secondary | ICD-10-CM

## 2015-06-04 DIAGNOSIS — I10 Essential (primary) hypertension: Secondary | ICD-10-CM

## 2015-06-04 DIAGNOSIS — R739 Hyperglycemia, unspecified: Secondary | ICD-10-CM

## 2015-06-04 DIAGNOSIS — E039 Hypothyroidism, unspecified: Secondary | ICD-10-CM | POA: Diagnosis not present

## 2015-06-04 DIAGNOSIS — Z0000 Encounter for general adult medical examination without abnormal findings: Secondary | ICD-10-CM | POA: Diagnosis not present

## 2015-06-04 DIAGNOSIS — D649 Anemia, unspecified: Secondary | ICD-10-CM

## 2015-06-04 DIAGNOSIS — E663 Overweight: Secondary | ICD-10-CM

## 2015-06-04 DIAGNOSIS — D696 Thrombocytopenia, unspecified: Secondary | ICD-10-CM

## 2015-06-04 NOTE — Assessment & Plan Note (Signed)
resolved 

## 2015-06-04 NOTE — Progress Notes (Signed)
Pre visit review using our clinic review tool, if applicable. No additional management support is needed unless otherwise documented below in the visit note. 

## 2015-06-04 NOTE — Assessment & Plan Note (Signed)
Resolved on recent labs.

## 2015-06-04 NOTE — Assessment & Plan Note (Signed)
Encouraged heart healthy diet, increase exercise, avoid trans fats, consider a krill oil cap daily 

## 2015-06-04 NOTE — Patient Instructions (Addendum)
Consider Zantac/Ranitidine 150 mg tabs OTC as needed drop Protonix to every other day x 1 month then to every 3rd day for a month then stop if no new symptoms    Preventive Care for Adults, Male A healthy lifestyle and preventive care can promote health and wellness. Preventive health guidelines for men include the following key practices:  A routine yearly physical is a good way to check with your health care provider about your health and preventative screening. It is a chance to share any concerns and updates on your health and to receive a thorough exam.  Visit your dentist for a routine exam and preventative care every 6 months. Brush your teeth twice a day and floss once a day. Good oral hygiene prevents tooth decay and gum disease.  The frequency of eye exams is based on your age, health, family medical history, use of contact lenses, and other factors. Follow your health care provider's recommendations for frequency of eye exams.  Eat a healthy diet. Foods such as vegetables, fruits, whole grains, low-fat dairy products, and lean protein foods contain the nutrients you need without too many calories. Decrease your intake of foods high in solid fats, added sugars, and salt. Eat the right amount of calories for you.Get information about a proper diet from your health care provider, if necessary.  Regular physical exercise is one of the most important things you can do for your health. Most adults should get at least 150 minutes of moderate-intensity exercise (any activity that increases your heart rate and causes you to sweat) each week. In addition, most adults need muscle-strengthening exercises on 2 or more days a week.  Maintain a healthy weight. The body mass index (BMI) is a screening tool to identify possible weight problems. It provides an estimate of body fat based on height and weight. Your health care provider can find your BMI and can help you achieve or maintain a healthy  weight.For adults 20 years and older:  A BMI below 18.5 is considered underweight.  A BMI of 18.5 to 24.9 is normal.  A BMI of 25 to 29.9 is considered overweight.  A BMI of 30 and above is considered obese.  Maintain normal blood lipids and cholesterol levels by exercising and minimizing your intake of saturated fat. Eat a balanced diet with plenty of fruit and vegetables. Blood tests for lipids and cholesterol should begin at age 8 and be repeated every 5 years. If your lipid or cholesterol levels are high, you are over 50, or you are at high risk for heart disease, you may need your cholesterol levels checked more frequently.Ongoing high lipid and cholesterol levels should be treated with medicines if diet and exercise are not working.  If you smoke, find out from your health care provider how to quit. If you do not use tobacco, do not start.  Lung cancer screening is recommended for adults aged 33-80 years who are at high risk for developing lung cancer because of a history of smoking. A yearly low-dose CT scan of the lungs is recommended for people who have at least a 30-pack-year history of smoking and are a current smoker or have quit within the past 15 years. A pack year of smoking is smoking an average of 1 pack of cigarettes a day for 1 year (for example: 1 pack a day for 30 years or 2 packs a day for 15 years). Yearly screening should continue until the smoker has stopped smoking for at least  15 years. Yearly screening should be stopped for people who develop a health problem that would prevent them from having lung cancer treatment.  If you choose to drink alcohol, do not have more than 2 drinks per day. One drink is considered to be 12 ounces (355 mL) of beer, 5 ounces (148 mL) of wine, or 1.5 ounces (44 mL) of liquor.  Avoid use of street drugs. Do not share needles with anyone. Ask for help if you need support or instructions about stopping the use of drugs.  High blood pressure  causes heart disease and increases the risk of stroke. Your blood pressure should be checked at least every 1-2 years. Ongoing high blood pressure should be treated with medicines, if weight loss and exercise are not effective.  If you are 33-108 years old, ask your health care provider if you should take aspirin to prevent heart disease.  Diabetes screening is done by taking a blood sample to check your blood glucose level after you have not eaten for a certain period of time (fasting). If you are not overweight and you do not have risk factors for diabetes, you should be screened once every 3 years starting at age 48. If you are overweight or obese and you are 46-13 years of age, you should be screened for diabetes every year as part of your cardiovascular risk assessment.  Colorectal cancer can be detected and often prevented. Most routine colorectal cancer screening begins at the age of 19 and continues through age 3. However, your health care provider may recommend screening at an earlier age if you have risk factors for colon cancer. On a yearly basis, your health care provider may provide home test kits to check for hidden blood in the stool. Use of a small camera at the end of a tube to directly examine the colon (sigmoidoscopy or colonoscopy) can detect the earliest forms of colorectal cancer. Talk to your health care provider about this at age 29, when routine screening begins. Direct exam of the colon should be repeated every 5-10 years through age 4, unless early forms of precancerous polyps or small growths are found.  People who are at an increased risk for hepatitis B should be screened for this virus. You are considered at high risk for hepatitis B if:  You were born in a country where hepatitis B occurs often. Talk with your health care provider about which countries are considered high risk.  Your parents were born in a high-risk country and you have not received a shot to protect  against hepatitis B (hepatitis B vaccine).  You have HIV or AIDS.  You use needles to inject street drugs.  You live with, or have sex with, someone who has hepatitis B.  You are a man who has sex with other men (MSM).  You get hemodialysis treatment.  You take certain medicines for conditions such as cancer, organ transplantation, and autoimmune conditions.  Hepatitis C blood testing is recommended for all people born from [birth year] through 1965 and any individual with known risks for hepatitis C.  Practice safe sex. Use condoms and avoid high-risk sexual practices to reduce the spread of sexually transmitted infections (STIs). STIs include gonorrhea, chlamydia, syphilis, trichomonas, herpes, HPV, and human immunodeficiency virus (HIV). Herpes, HIV, and HPV are viral illnesses that have no cure. They can result in disability, cancer, and death.  If you are a man who has sex with other men, you should be screened at least once  per year for:  HIV.  Urethral, rectal, and pharyngeal infection of gonorrhea, chlamydia, or both.  If you are at risk of being infected with HIV, it is recommended that you take a prescription medicine daily to prevent HIV infection. This is called preexposure prophylaxis (PrEP). You are considered at risk if:  You are a man who has sex with other men (MSM) and have other risk factors.  You are a heterosexual man, are sexually active, and are at increased risk for HIV infection.  You take drugs by injection.  You are sexually active with a partner who has HIV.  Talk with your health care provider about whether you are at high risk of being infected with HIV. If you choose to begin PrEP, you should first be tested for HIV. You should then be tested every 3 months for as long as you are taking PrEP.  A one-time screening for abdominal aortic aneurysm (AAA) and surgical repair of large AAAs by ultrasound are recommended for men ages 43 to 80 years who are current or  former smokers.  Healthy men should no longer receive prostate-specific antigen (PSA) blood tests as part of routine cancer screening. Talk with your health care provider about prostate cancer screening.  Testicular cancer screening is not recommended for adult males who have no symptoms. Screening includes self-exam, a health care provider exam, and other screening tests. Consult with your health care provider about any symptoms you have or any concerns you have about testicular cancer.  Use sunscreen. Apply sunscreen liberally and repeatedly throughout the day. You should seek shade when your shadow is shorter than you. Protect yourself by wearing long sleeves, pants, a wide-brimmed hat, and sunglasses year round, whenever you are outdoors.  Once a month, do a whole-body skin exam, using a mirror to look at the skin on your back. Tell your health care provider about new moles, moles that have irregular borders, moles that are larger than a pencil eraser, or moles that have changed in shape or color.  Stay current with required vaccines (immunizations).  Influenza vaccine. All adults should be immunized every year.  Tetanus, diphtheria, and acellular pertussis (Td, Tdap) vaccine. An adult who has not previously received Tdap or who does not know his vaccine status should receive 1 dose of Tdap. This initial dose should be followed by tetanus and diphtheria toxoids (Td) booster doses every 10 years. Adults with an unknown or incomplete history of completing a 3-dose immunization series with Td-containing vaccines should begin or complete a primary immunization series including a Tdap dose. Adults should receive a Td booster every 10 years.  Varicella vaccine. An adult without evidence of immunity to varicella should receive 2 doses or a second dose if he has previously received 1 dose.  Human papillomavirus (HPV) vaccine. Males aged 11-21 years who have not received the vaccine previously should  receive the 3-dose series. Males aged 22-26 years may be immunized. Immunization is recommended through the age of 21 years for any male who has sex with males and did not get any or all doses earlier. Immunization is recommended for any person with an immunocompromised condition through the age of 7 years if he did not get any or all doses earlier. During the 3-dose series, the second dose should be obtained 4-8 weeks after the first dose. The third dose should be obtained 24 weeks after the first dose and 16 weeks after the second dose.  Zoster vaccine. One dose is recommended for  adults aged 48 years or older unless certain conditions are present.  Measles, mumps, and rubella (MMR) vaccine. Adults born before [birth year] generally are considered immune to measles and mumps. Adults born in [birth year] or later should have 1 or more doses of MMR vaccine unless there is a contraindication to the vaccine or there is laboratory evidence of immunity to each of the three diseases. A routine second dose of MMR vaccine should be obtained at least 28 days after the first dose for students attending postsecondary schools, health care workers, or international travelers. People who received inactivated measles vaccine or an unknown type of measles vaccine during 1963-1967 should receive 2 doses of MMR vaccine. People who received inactivated mumps vaccine or an unknown type of mumps vaccine before 1979 and are at high risk for mumps infection should consider immunization with 2 doses of MMR vaccine. Unvaccinated health care workers born before [birth year] who lack laboratory evidence of measles, mumps, or rubella immunity or laboratory confirmation of disease should consider measles and mumps immunization with 2 doses of MMR vaccine or rubella immunization with 1 dose of MMR vaccine.  Pneumococcal 13-valent conjugate (PCV13) vaccine. When indicated, a person who is uncertain of his immunization history and has no record of immunization  should receive the PCV13 vaccine. All adults 71 years of age and older should receive this vaccine. An adult aged 43 years or older who has certain medical conditions and has not been previously immunized should receive 1 dose of PCV13 vaccine. This PCV13 should be followed with a dose of pneumococcal polysaccharide (PPSV23) vaccine. Adults who are at high risk for pneumococcal disease should obtain the PPSV23 vaccine at least 8 weeks after the dose of PCV13 vaccine. Adults older than 78 years of age who have normal immune system function should obtain the PPSV23 vaccine dose at least 1 year after the dose of PCV13 vaccine.  Pneumococcal polysaccharide (PPSV23) vaccine. When PCV13 is also indicated, PCV13 should be obtained first. All adults aged 19 years and older should be immunized. An adult younger than age 61 years who has certain medical conditions should be immunized. Any person who resides in a nursing home or long-term care facility should be immunized. An adult smoker should be immunized. People with an immunocompromised condition and certain other conditions should receive both PCV13 and PPSV23 vaccines. People with human immunodeficiency virus (HIV) infection should be immunized as soon as possible after diagnosis. Immunization during chemotherapy or radiation therapy should be avoided. Routine use of PPSV23 vaccine is not recommended for American Indians, [place]atives, or people younger than 65 years unless there are medical conditions that require PPSV23 vaccine. When indicated, people who have unknown immunization and have no record of immunization should receive PPSV23 vaccine. One-time revaccination 5 years after the first dose of PPSV23 is recommended for people aged 19-64 years who have chronic kidney failure, nephrotic syndrome, asplenia, or immunocompromised conditions. People who received 1-2 doses of PPSV23 before age 25 years should receive another dose of PPSV23 vaccine at age 64 years  or later if at least 5 years have passed since the previous dose. Doses of PPSV23 are not needed for people immunized with PPSV23 at or after age 74 years.  Meningococcal vaccine. Adults with asplenia or persistent complement component deficiencies should receive 2 doses of quadrivalent meningococcal conjugate (MenACWY-D) vaccine. The doses should be obtained at least 2 months apart. Microbiologists working with certain meningococcal bacteria, [place] recruits, people at risk during an outbreak, and people who  travel to or live in countries with a high rate of meningitis should be immunized. A first-year college student up through age 68 years who is living in a residence hall should receive a dose if he did not receive a dose on or after his 16th birthday. Adults who have certain high-risk conditions should receive one or more doses of vaccine.  Hepatitis A vaccine. Adults who wish to be protected from this disease, have chronic liver disease, work with hepatitis A-infected animals, work in hepatitis A research labs, or travel to or work in countries with a high rate of hepatitis A should be immunized. Adults who were previously unvaccinated and who anticipate close contact with an international adoptee during the first 60 days after arrival in the [place] from a country with a high rate of hepatitis A should be immunized.  Hepatitis B vaccine. Adults should be immunized if they wish to be protected from this disease, are under age 77 years and have diabetes, have chronic liver disease, have had more than one sex partner in the past 6 months, may be exposed to blood or other infectious body fluids, are household contacts or sex partners of hepatitis B positive people, are clients or workers in certain care facilities, or travel to or work in countries with a high rate of hepatitis B.  Haemophilus influenzae type b (Hib) vaccine. A previously unvaccinated person with asplenia or sickle cell disease or  having a scheduled splenectomy should receive 1 dose of Hib vaccine. Regardless of previous immunization, a recipient of a hematopoietic stem cell transplant should receive a 3-dose series 6-12 months after his successful transplant. Hib vaccine is not recommended for adults with HIV infection. Preventive Service / Frequency Ages 69 to 32  Blood pressure check.** / Every 3-5 years.  Lipid and cholesterol check.** / Every 5 years beginning at age 79.  Hepatitis C blood test.** / For any individual with known risks for hepatitis C.  Skin self-exam. / Monthly.  Influenza vaccine. / Every year.  Tetanus, diphtheria, and acellular pertussis (Tdap, Td) vaccine.** / Consult your health care provider. 1 dose of Td every 10 years.  Varicella vaccine.** / Consult your health care provider.  HPV vaccine. / 3 doses over 6 months, if 44 or younger.  Measles, mumps, rubella (MMR) vaccine.** / You need at least 1 dose of MMR if you were born in 1957 or later. You may also need a second dose.  Pneumococcal 13-valent conjugate (PCV13) vaccine.** / Consult your health care provider.  Pneumococcal polysaccharide (PPSV23) vaccine.** / 1 to 2 doses if you smoke cigarettes or if you have certain conditions.  Meningococcal vaccine.** / 1 dose if you are age 38 to 76 years and a Market researcher living in a residence hall, or have one of several medical conditions. You may also need additional booster doses.  Hepatitis A vaccine.** / Consult your health care provider.  Hepatitis B vaccine.** / Consult your health care provider.  Haemophilus influenzae type b (Hib) vaccine.** / Consult your health care provider. Ages 56 to 63  Blood pressure check.** / Every year.  Lipid and cholesterol check.** / Every 5 years beginning at age 63.  Lung cancer screening. / Every year if you are aged 34-80 years and have a 30-pack-year history of smoking and currently smoke or have quit within the past 15  years. Yearly screening is stopped once you have quit smoking for at least 15 years or develop a health problem that  would prevent you from having lung cancer treatment.  Fecal occult blood test (FOBT) of stool. / Every year beginning at age 34 and continuing until age 87. You may not have to do this test if you get a colonoscopy every 10 years.  Flexible sigmoidoscopy** or colonoscopy.** / Every 5 years for a flexible sigmoidoscopy or every 10 years for a colonoscopy beginning at age 63 and continuing until age 11.  Hepatitis C blood test.** / For all people born from [birth year] through 1965 and any individual with known risks for hepatitis C.  Skin self-exam. / Monthly.  Influenza vaccine. / Every year.  Tetanus, diphtheria, and acellular pertussis (Tdap/Td) vaccine.** / Consult your health care provider. 1 dose of Td every 10 years.  Varicella vaccine.** / Consult your health care provider.  Zoster vaccine.** / 1 dose for adults aged 28 years or older.  Measles, mumps, rubella (MMR) vaccine.** / You need at least 1 dose of MMR if you were born in 1957 or later. You may also need a second dose.  Pneumococcal 13-valent conjugate (PCV13) vaccine.** / Consult your health care provider.  Pneumococcal polysaccharide (PPSV23) vaccine.** / 1 to 2 doses if you smoke cigarettes or if you have certain conditions.  Meningococcal vaccine.** / Consult your health care provider.  Hepatitis A vaccine.** / Consult your health care provider.  Hepatitis B vaccine.** / Consult your health care provider.  Haemophilus influenzae type b (Hib) vaccine.** / Consult your health care provider. Ages 89 and over  Blood pressure check.** / Every year.  Lipid and cholesterol check.**/ Every 5 years beginning at age 81.  Lung cancer screening. / Every year if you are aged 32-80 years and have a 30-pack-year history of smoking and currently smoke or have quit within the past 15 years. Yearly screening is stopped  once you have quit smoking for at least 15 years or develop a health problem that would prevent you from having lung cancer treatment.  Fecal occult blood test (FOBT) of stool. / Every year beginning at age 64 and continuing until age 41. You may not have to do this test if you get a colonoscopy every 10 years.  Flexible sigmoidoscopy** or colonoscopy.** / Every 5 years for a flexible sigmoidoscopy or every 10 years for a colonoscopy beginning at age 25 and continuing until age 12.  Hepatitis C blood test.** / For all people born from [birth year] through 1963 and any individual with known risks for hepatitis C.  Abdominal aortic aneurysm (AAA) screening.** / A one-time screening for ages 33 to 56 years who are current or former smokers.  Skin self-exam. / Monthly.  Influenza vaccine. / Every year.  Tetanus, diphtheria, and acellular pertussis (Tdap/Td) vaccine.** / 1 dose of Td every 10 years.  Varicella vaccine.** / Consult your health care provider.  Zoster vaccine.** / 1 dose for adults aged 59 years or older.  Pneumococcal 13-valent conjugate (PCV13) vaccine.** / 1 dose for all adults aged 77 years and older.  Pneumococcal polysaccharide (PPSV23) vaccine.** / 1 dose for all adults aged 53 years and older.  Meningococcal vaccine.** / Consult your health care provider.  Hepatitis A vaccine.** / Consult your health care provider.  Hepatitis B vaccine.** / Consult your health care provider.  Haemophilus influenzae type b (Hib) vaccine.** / Consult your health care provider. **Family history and personal history of risk and conditions may change your health care provider's recommendations.   This information is not intended to replace advice given to you  by your health care provider. Make sure you discuss any questions you have with your health care provider.   Document Released: 09/01/2001 Document Revised: 07/27/2014 Document Reviewed: 12/01/2010 Elsevier Interactive Patient Education  [employer].

## 2015-06-04 NOTE — Progress Notes (Signed)
Subjective:    Patient ID: Tanner [name], male    DOB: 11-21-1934, 80 y.o.   MRN: [id number]  Chief Complaint  Patient presents with  . Medicare Wellness    HPI Patient is in today for wellness exam and follow-up on numerous concerns. He feels well today. He's had no recent illness or acute concerns. He is noting no significant trouble with heartburn but continues to take Protonix. Is interested in trying to stop the medication. Is doing well with his activities of daily living and denies any concerns at home. Denies CP/palp/SOB/HA/congestion/fevers/GI or GU c/o. Taking meds as prescribed  Past Medical History  Diagnosis Date  . Allergy     seasonal- mostly spring  . TOBACCO ABUSE, HX OF 03/17/2010  . Overweight(278.02) 07/01/2010  . Personal history of other infectious and parasitic disease 03/17/2010  . Mixed hyperlipidemia 03/17/2010  . HYPERKALEMIA 05/27/2010  . FATIGUE 03/17/2010  . CHICKENPOX, HX OF 03/17/2010  . CAROTID ARTERY OCCLUSION, WITH INFARCTION 03/17/2010  . Hearing loss 11/04/2010    bilateral  . Anemia 02/03/2011  . Hypertension   . Tubular adenoma of colon 06/10/2011  . Hypothyroid 12/08/2011  . BCC (basal cell carcinoma) 12/08/2011  . Thrombocytopenia ([place]) 04/05/2012  . ASTHMA, CHILDHOOD 03/17/2010    mostly as child  . Sleep apnea     uses mouthpiece only  . CEREBROVASCULAR ACCIDENT 03/17/2010    partial loss of peripheral vision on left-"slight improved"  . Epistaxis 09/06/2013  . Skin lesion of right arm 11/20/2014  . Oral lesion 11/20/2014  . Constipation 12/08/2014    Past Surgical History  Procedure Laterality Date  . Tonsillectomy    . Pilonidal cyst excision  1968    removed  . Knee cartliage repair  1964, 1990    bilateral  . Rotator cuff repair      right  . Endarterectomy Left 02/13/2013    Procedure: ENDARTERECTOMY CAROTID;  Surgeon: Rosetta Posner, MD;  Location: [facility];  Service: Vascular;  Laterality: Left;  . Eus N/A 08/17/2013    Procedure:  UPPER ENDOSCOPIC ULTRASOUND (EUS) LINEAR;  Surgeon: Milus Banister, MD;  Location: [facility];  Service: Endoscopy;  Laterality: N/A;  . Carotid endarterectomy Left February 13, 2013    cea  . Colonoscopy  Dec. 2014  . Upper gi endoscopy  Jan. 2015    Family History  Problem Relation Age of Onset  . Alzheimer's disease Mother   . Emphysema Sister     cigarettes  . Heart disease Son   . Coronary artery disease Brother   . Heart attack Brother   . Other Brother     heart problems- from fathers side  . Cancer Brother   . Heart disease Brother 44    Heart disease before age 79  . Cancer Sister     gyn  . Heart disease Sister   . Bipolar disorder Daughter   . Obesity Daughter   . Heart disease Sister   . Stroke Father     Social History   Social History  . Marital Status: Divorced    Spouse Name: N/A  . Number of Children: 3  . Years of Education: 65yrs   Occupational History  . retired    Social History Main Topics  . Smoking status: Former Smoker -- 2.00 packs/day for 20 years    Types: Cigarettes    Quit date: 07/19/1977  . Smokeless tobacco: Never Used  . Alcohol Use: 4.2 oz/week  7 Glasses of wine per week     Comment: dinner wine daily  . Drug Use: No  . Sexual Activity: No   Other Topics Concern  . Not on file   Social History Narrative   Pt lives at home alone.    Caffeine Use: very little    Outpatient Prescriptions Prior to Visit  Medication Sig Dispense Refill  . aspirin EC 81 MG tablet Take 81 mg by mouth daily.    [name] b complex vitamins tablet Take 1 tablet by mouth daily.    . chlorthalidone (HYGROTON) 25 MG tablet Take 1 tablet by mouth  every morning 90 tablet 1  . Cholecalciferol (VITAMIN D PO) Take 2,000 mg by mouth daily.     . clopidogrel (PLAVIX) 75 MG tablet Take 1 tablet by mouth  daily with breakfast 90 tablet 1  . Coenzyme Q10 (CO Q-10) 300 MG CAPS Take 1 capsule by mouth daily.    . CRESTOR 40 MG tablet Take 1 tablet by mouth   every evening 90 tablet 1  . Glucosamine-Chondroit-Vit C-Mn (GLUCOSAMINE 1500 COMPLEX PO) Take 1,500 mg by mouth daily.    [name] levothyroxine (SYNTHROID, LEVOTHROID) 75 MCG tablet Take 1 tablet by mouth  daily 90 tablet 1  . magnesium oxide (MAG-OX) 400 MG tablet Take 400 mg by mouth daily.    [name] OVER THE COUNTER MEDICATION Take 1 capsule by mouth daily. Mega red 500 mg once daily    . pantoprazole (PROTONIX) 40 MG tablet Take 1 tablet by mouth  daily 90 tablet 1  . Probiotic Product (PROBIOTIC PO) Take 1 capsule by mouth daily.     . ramipril (ALTACE) 10 MG capsule Take 1 capsule by mouth two times daily 180 capsule 1   No facility-administered medications prior to visit.    No Known Allergies  Review of Systems  Constitutional: Negative for fever, chills and malaise/fatigue.  HENT: Negative for congestion and hearing loss.   Eyes: Negative for discharge.  Respiratory: Negative for cough, sputum production and shortness of breath.   Cardiovascular: Negative for chest pain, palpitations and leg swelling.  Gastrointestinal: Positive for heartburn. Negative for nausea, vomiting, abdominal pain, diarrhea, constipation and blood in stool.  Genitourinary: Negative for dysuria, urgency, frequency and hematuria.  Musculoskeletal: Negative for myalgias, back pain and falls.  Skin: Negative for rash.  Neurological: Negative for dizziness, sensory change, loss of consciousness, weakness and headaches.  Endo/Heme/Allergies: Negative for environmental allergies. Does not bruise/bleed easily.  Psychiatric/Behavioral: Negative for depression and suicidal ideas. The patient is not nervous/anxious and does not have insomnia.        Objective:    Physical Exam  Constitutional: He is oriented to person, place, and time. He appears well-developed and well-nourished. No distress.  HENT:  Head: Normocephalic and atraumatic.  Eyes: Conjunctivae are normal.  Neck: Neck supple. No thyromegaly present.    Cardiovascular: Normal rate, regular rhythm and normal heart sounds.   No murmur heard. Pulmonary/Chest: Effort normal and breath sounds normal. No respiratory distress. He has no wheezes.  Abdominal: Soft. Bowel sounds are normal. He exhibits no mass. There is no tenderness.  Musculoskeletal: He exhibits no edema.  Lymphadenopathy:    He has no cervical adenopathy.  Neurological: He is alert and oriented to person, place, and time.  Skin: Skin is warm and dry.  Psychiatric: He has a normal mood and affect. His behavior is normal.    BP 142/80 mmHg  Pulse 63  Temp(Src) 98.2  F (36.8 C) (Oral)  Ht 6\' 2"  (1.88 m)  Wt 204 lb (92.534 kg)  BMI 26.18 kg/m2  SpO2 97% Wt Readings from Last 3 Encounters:  06/04/15 204 lb (92.534 kg)  04/04/15 199 lb 3.2 oz (90.357 kg)  04/02/15 201 lb (91.173 kg)     Lab Results  Component Value Date   WBC 6.6 05/28/2015   HGB 14.5 05/28/2015   HCT 44.1 05/28/2015   PLT 169.0 05/28/2015   GLUCOSE 99 05/28/2015   CHOL 157 05/28/2015   TRIG 115.0 05/28/2015   HDL 46.30 05/28/2015   LDLCALC 88 05/28/2015   ALT 18 05/28/2015   AST 21 05/28/2015   NA 137 05/28/2015   K 4.7 05/28/2015   CL 100 05/28/2015   CREATININE 1.05 05/28/2015   BUN 16 05/28/2015   CO2 30 05/28/2015   TSH 2.67 05/28/2015   PSA 0.39 05/08/2014   INR 0.99 02/07/2013   HGBA1C 5.8 11/13/2014    Lab Results  Component Value Date   TSH 2.67 05/28/2015   Lab Results  Component Value Date   WBC 6.6 05/28/2015   HGB 14.5 05/28/2015   HCT 44.1 05/28/2015   MCV 95.7 05/28/2015   PLT 169.0 05/28/2015   Lab Results  Component Value Date   NA 137 05/28/2015   K 4.7 05/28/2015   CO2 30 05/28/2015   GLUCOSE 99 05/28/2015   BUN 16 05/28/2015   CREATININE 1.05 05/28/2015   BILITOT 0.8 05/28/2015   ALKPHOS 63 05/28/2015   AST 21 05/28/2015   ALT 18 05/28/2015   PROT 7.1 05/28/2015   ALBUMIN 4.6 05/28/2015   CALCIUM 9.6 05/28/2015   GFR 72.54 05/28/2015   Lab  Results  Component Value Date   CHOL 157 05/28/2015   Lab Results  Component Value Date   HDL 46.30 05/28/2015   Lab Results  Component Value Date   LDLCALC 88 05/28/2015   Lab Results  Component Value Date   TRIG 115.0 05/28/2015   Lab Results  Component Value Date   CHOLHDL 3 05/28/2015   Lab Results  Component Value Date   HGBA1C 5.8 11/13/2014       Assessment & Plan:   Problem List Items Addressed This Visit    None      I am having Mr. Kuo maintain his Co Q-10, Cholecalciferol (VITAMIN D PO), b complex vitamins, OVER THE COUNTER MEDICATION, Probiotic Product (PROBIOTIC PO), aspirin EC, Glucosamine-Chondroit-Vit C-Mn (GLUCOSAMINE 1500 COMPLEX PO), ramipril, pantoprazole, chlorthalidone, CRESTOR, levothyroxine, clopidogrel, and magnesium oxide.  No orders of the defined types were placed in this encounter.     Penni Homans, MD

## 2015-06-04 NOTE — Assessment & Plan Note (Signed)
Encouraged DASH diet, decrease po intake and increase exercise as tolerated. Needs 7-8 hours of sleep nightly. Avoid trans fats, eat small, frequent meals every 4-5 hours with lean proteins, complex carbs and healthy fats. Minimize simple carbs, GMO foods. 

## 2015-06-04 NOTE — Assessment & Plan Note (Signed)
Well controlled, no changes to meds. Encouraged heart healthy diet such as the DASH diet and exercise as tolerated.  °

## 2015-06-04 NOTE — Assessment & Plan Note (Signed)
On Levothyroxine, continue to monitor 

## 2015-06-04 NOTE — Assessment & Plan Note (Signed)
Patient denies any difficulties at home. No trouble with ADLs, depression or falls. See EMR for functional status screen and depression screen. No recent changes to vision or hearing. Is UTD with immunizations. Is UTD with screening. Discussed Advanced Directives. Encouraged heart healthy diet, exercise as tolerated and adequate sleep. See patient's problem list for health risk factors to monitor. See AVS for preventative healthcare recommendation schedule.   Allergies verified: NKA  Immunization Status: Flu vaccine-- 05/07/15; patient reported Tdap-- 06/10/11 PNA-- 05/17/14 Shingles-- 07/21/07  A/P:  Changes to [place], [place] or Personal Hx: UTD CCS: 08/03/13 w/ Dr. Zenovia Jarred at [facility]; two sessile polyps (3-5 mm) found in the ascending colon; polypectomy performed; moderate diverticulosis noted in the descending colon and sigmoid colon; follow-up in 5 years. PSA: 05/08/14 w/ Dr. [name]; 0.39; normal Bone Density: NA  Care Teams Updated:  Dr. Sherren Mocha Early - Vascular Surgery  Dr. Druscilla Brownie - Dermatology  Dr. Metta Clines - Neurology  ED/Hospital/Urgent Care Visits: Per the patient, no recent visits to the ED/Hospital or Urgent Care.  Labs reviewed, look good

## 2015-09-03 ENCOUNTER — Other Ambulatory Visit: Payer: Self-pay | Admitting: Family Medicine

## 2015-10-14 ENCOUNTER — Encounter: Payer: Self-pay | Admitting: Family Medicine

## 2015-10-14 ENCOUNTER — Other Ambulatory Visit: Payer: Self-pay | Admitting: Family Medicine

## 2015-10-14 MED ORDER — CHLORTHALIDONE 25 MG PO TABS: ORAL_TABLET | ORAL | Status: DC

## 2015-10-14 MED ORDER — RAMIPRIL 10 MG PO CAPS: ORAL_CAPSULE | ORAL | Status: DC

## 2015-10-14 MED ORDER — ROSUVASTATIN CALCIUM 40 MG PO TABS: ORAL_TABLET | ORAL | Status: DC

## 2015-10-14 MED ORDER — LEVOTHYROXINE SODIUM 75 MCG PO TABS: ORAL_TABLET | ORAL | Status: DC

## 2015-10-14 MED ORDER — CLOPIDOGREL BISULFATE 75 MG PO TABS: ORAL_TABLET | ORAL | Status: DC

## 2015-11-26 ENCOUNTER — Encounter: Payer: Self-pay | Admitting: Family Medicine

## 2015-11-26 ENCOUNTER — Other Ambulatory Visit: Payer: Self-pay | Admitting: Family Medicine

## 2015-11-26 DIAGNOSIS — R739 Hyperglycemia, unspecified: Secondary | ICD-10-CM

## 2015-12-03 ENCOUNTER — Other Ambulatory Visit (INDEPENDENT_AMBULATORY_CARE_PROVIDER_SITE_OTHER): Payer: Medicare Other

## 2015-12-03 DIAGNOSIS — D696 Thrombocytopenia, unspecified: Secondary | ICD-10-CM

## 2015-12-03 DIAGNOSIS — Z0000 Encounter for general adult medical examination without abnormal findings: Secondary | ICD-10-CM

## 2015-12-03 DIAGNOSIS — E039 Hypothyroidism, unspecified: Secondary | ICD-10-CM | POA: Diagnosis not present

## 2015-12-03 DIAGNOSIS — R739 Hyperglycemia, unspecified: Secondary | ICD-10-CM

## 2015-12-03 DIAGNOSIS — E782 Mixed hyperlipidemia: Secondary | ICD-10-CM

## 2015-12-03 DIAGNOSIS — E663 Overweight: Secondary | ICD-10-CM | POA: Diagnosis not present

## 2015-12-03 DIAGNOSIS — I10 Essential (primary) hypertension: Secondary | ICD-10-CM

## 2015-12-03 DIAGNOSIS — D649 Anemia, unspecified: Secondary | ICD-10-CM | POA: Diagnosis not present

## 2015-12-03 LAB — CBC
HEMATOCRIT: 42.1 % (ref 39.0–52.0)
Hemoglobin: 14.1 g/dL (ref 13.0–17.0)
MCHC: 33.6 g/dL (ref 30.0–36.0)
MCV: 94.2 fl (ref 78.0–100.0)
PLATELETS: 167 10*3/uL (ref 150.0–400.0)
RBC: 4.46 Mil/uL (ref 4.22–5.81)
RDW: 15 % (ref 11.5–15.5)
WBC: 5.4 10*3/uL (ref 4.0–10.5)

## 2015-12-03 LAB — COMPREHENSIVE METABOLIC PANEL
ALBUMIN: 4.6 g/dL (ref 3.5–5.2)
ALK PHOS: 56 U/L (ref 39–117)
ALT: 13 U/L (ref 0–53)
AST: 16 U/L (ref 0–37)
BUN: 19 mg/dL (ref 6–23)
CO2: 31 mEq/L (ref 19–32)
Calcium: 10 mg/dL (ref 8.4–10.5)
Chloride: 103 mEq/L (ref 96–112)
Creatinine, Ser: 1.06 mg/dL (ref 0.40–1.50)
GFR: 71.65 mL/min (ref >60.00)
Glucose, Bld: 93 mg/dL (ref 70–99)
POTASSIUM: 4.9 meq/L (ref 3.5–5.1)
Sodium: 140 mEq/L (ref 135–145)
TOTAL PROTEIN: 6.8 g/dL (ref 6.0–8.3)
Total Bilirubin: 0.9 mg/dL (ref 0.2–1.2)

## 2015-12-03 LAB — TSH: TSH: 3.59 u[IU]/mL (ref 0.35–4.50)

## 2015-12-03 LAB — LIPID PANEL
CHOLESTEROL: 160 mg/dL (ref 0–200)
HDL: 47.3 mg/dL (ref >39.00)
LDL Cholesterol: 94 mg/dL (ref 0–99)
NonHDL: 112.69
Total CHOL/HDL Ratio: 3
Triglycerides: 95 mg/dL (ref 0.0–149.0)
VLDL: 19 mg/dL (ref 0.0–40.0)

## 2015-12-03 LAB — MICROALBUMIN / CREATININE URINE RATIO
CREATININE, U: 220.3 mg/dL
MICROALB/CREAT RATIO: 0.9 mg/g (ref 0.0–30.0)
Microalb, Ur: 2 mg/dL — ABNORMAL HIGH (ref 0.0–1.9)

## 2015-12-03 LAB — HEMOGLOBIN A1C: HEMOGLOBIN A1C: 5.8 % (ref 4.6–6.5)

## 2015-12-10 ENCOUNTER — Ambulatory Visit: Payer: Medicare Other | Admitting: Family Medicine

## 2015-12-10 ENCOUNTER — Telehealth: Payer: Self-pay

## 2015-12-10 NOTE — Telephone Encounter (Signed)
Patient is on my [employer] list for 2017. Pt may be a good candidate for an AWV for 2017 

## 2015-12-11 NOTE — Telephone Encounter (Signed)
Noted.  Please try to schedule.

## 2015-12-11 NOTE — Telephone Encounter (Signed)
Not due until after 06/03/16

## 2016-01-07 ENCOUNTER — Other Ambulatory Visit: Payer: Self-pay | Admitting: Family Medicine

## 2016-01-07 ENCOUNTER — Telehealth: Payer: Self-pay | Admitting: Family Medicine

## 2016-01-07 ENCOUNTER — Ambulatory Visit (INDEPENDENT_AMBULATORY_CARE_PROVIDER_SITE_OTHER): Payer: Medicare Other | Admitting: Family Medicine

## 2016-01-07 ENCOUNTER — Encounter: Payer: Self-pay | Admitting: Family Medicine

## 2016-01-07 VITALS — BP 132/86 | HR 57 | Temp 98.4°F | Ht 74.0 in | Wt 207.2 lb

## 2016-01-07 DIAGNOSIS — E785 Hyperlipidemia, unspecified: Secondary | ICD-10-CM

## 2016-01-07 DIAGNOSIS — I10 Essential (primary) hypertension: Secondary | ICD-10-CM | POA: Diagnosis not present

## 2016-01-07 DIAGNOSIS — R739 Hyperglycemia, unspecified: Secondary | ICD-10-CM

## 2016-01-07 DIAGNOSIS — E782 Mixed hyperlipidemia: Secondary | ICD-10-CM | POA: Diagnosis not present

## 2016-01-07 DIAGNOSIS — E039 Hypothyroidism, unspecified: Secondary | ICD-10-CM | POA: Diagnosis not present

## 2016-01-07 DIAGNOSIS — E663 Overweight: Secondary | ICD-10-CM | POA: Diagnosis not present

## 2016-01-07 MED ORDER — PANTOPRAZOLE SODIUM 40 MG PO TBEC: DELAYED_RELEASE_TABLET | ORAL | Status: DC

## 2016-01-07 NOTE — Telephone Encounter (Addendum)
Relation to [id number] Call back number:[phone] Pharmacy:  Reason for call:  Patient requesting pre visit labs prior to 06/23/16 physical appointment. Patient would like labs drawn at [facility].

## 2016-01-07 NOTE — Progress Notes (Signed)
Pre visit review using our clinic review tool, if applicable. No additional management support is needed unless otherwise documented below in the visit note. 

## 2016-01-07 NOTE — Patient Instructions (Addendum)
Increase clear fluids and stop sweet tea NOW probiotic 10 strain cap daily at [employer] [place]  [place] at [email or web address] caps daily   Basic Carbohydrate Counting  Carbohydrate counting is a method for keeping track of the amount of carbohydrates you eat. Eating carbohydrates naturally increases the level of sugar (glucose) in your blood, so it is important for you to know the amount that is okay for you to have in every meal. Carbohydrate counting helps keep the level of glucose in your blood within normal limits. The amount of carbohydrates allowed is different for every person. A dietitian can help you calculate the amount that is right for you. Once you know the amount of carbohydrates you can have, you can count the carbohydrates in the foods you want to eat. Carbohydrates are found in the following foods:  Grains, such as breads and cereals.  Dried beans and soy products.  Starchy vegetables, such as potatoes, peas, and corn.  Fruit and fruit juices.  Milk and yogurt.  Sweets and snack foods, such as cake, cookies, candy, chips, soft drinks, and fruit drinks. CARBOHYDRATE COUNTING There are two ways to count the carbohydrates in your food. You can use either of the methods or a combination of both. Reading the "Nutrition Facts" on [place] The "Nutrition Facts" is an area that is included on the labels of almost all packaged food and beverages in the [place]. It includes the serving size of that food or beverage and information about the nutrients in each serving of the food, including the grams (g) of carbohydrate per serving.  Decide the number of servings of this food or beverage that you will be able to eat or drink. Multiply that number of servings by the number of grams of carbohydrate that is listed on the label for that serving. The total will be the amount of carbohydrates you will be having when you eat or drink this food or  beverage. Learning Standard Serving Sizes of Food When you eat food that is not packaged or does not include "Nutrition Facts" on the label, you need to measure the servings in order to count the amount of carbohydrates.A serving of most carbohydrate-rich foods contains about 15 g of carbohydrates. The following list includes serving sizes of carbohydrate-rich foods that provide 15 g ofcarbohydrate per serving:   1 slice of bread (1 oz) or 1 six-inch tortilla.    of a hamburger bun or English muffin.  4-6 crackers.   cup unsweetened dry cereal.    cup hot cereal.   cup rice or pasta.    cup mashed potatoes or  of a large baked potato.  1 cup fresh fruit or one small piece of fruit.    cup canned or frozen fruit or fruit juice.  1 cup milk.   cup plain fat-free yogurt or yogurt sweetened with artificial sweeteners.   cup cooked dried beans or starchy vegetable, such as peas, corn, or potatoes.  Decide the number of standard-size servings that you will eat. Multiply that number of servings by 15 (the grams of carbohydrates in that serving). For example, if you eat 2 cups of strawberries, you will have eaten 2 servings and 30 g of carbohydrates (2 servings x 15 g = 30 g). For foods such as soups and casseroles, in which more than one food is mixed in, you will need to count the carbohydrates in each food that is included. EXAMPLE OF CARBOHYDRATE COUNTING Sample Dinner  3 oz chicken breast.   cup of brown rice.   cup of corn.  1 cup milk.   1 cup strawberries with sugar-free whipped topping.  Carbohydrate Calculation Step 1: Identify the foods that contain carbohydrates:   Rice.   Corn.   Milk.   Strawberries. Step 2:Calculate the number of servings eaten of each:   2 servings of rice.   1 serving of corn.   1 serving of milk.   1 serving of strawberries. Step 3: Multiply each of those number of servings by 15 g:   2 servings of  rice x 15 g = 30 g.   1 serving of corn x 15 g = 15 g.   1 serving of milk x 15 g = 15 g.   1 serving of strawberries x 15 g = 15 g. Step 4: Add together all of the amounts to find the total grams of carbohydrates eaten: 30 g + 15 g + 15 g + 15 g = 75 g.   This information is not intended to replace advice given to you by your health care provider. Make sure you discuss any questions you have with your health care provider.   Document Released: 07/06/2005 Document Revised: 07/27/2014 Document Reviewed: 06/02/2013 Elsevier Interactive Patient Education [employer].

## 2016-01-07 NOTE — Telephone Encounter (Signed)
Labs ordered as instructed to [facility] in December.

## 2016-01-07 NOTE — Telephone Encounter (Signed)
Please order labs for [facility] as requested. Hgba1c, lipid, cmp, cbc, tsh, cbc for htn, hi chol, hyperglycemia in december

## 2016-01-12 NOTE — Assessment & Plan Note (Signed)
On Levothyroxine, continue to monitor 

## 2016-01-12 NOTE — Assessment & Plan Note (Signed)
Encouraged DASH diet, decrease po intake and increase exercise as tolerated. Needs 7-8 hours of sleep nightly. Avoid trans fats, eat small, frequent meals every 4-5 hours with lean proteins, complex carbs and healthy fats. Minimize simple carbs 

## 2016-01-12 NOTE — Progress Notes (Signed)
Patient ID: Tanner [name], male   DOB: [birth year], 81 y.o.   MRN: [id number]   Subjective:    Patient ID: Tanner [name], male    DOB: Mar 01, 1935, 81 y.o.   MRN: [id number]  Chief Complaint  Patient presents with  . Follow-up    HPI Patient is in today for follow up. He is feeling well for the most part. He is noting some episodes of feeling wobbly up arising quickly. He has been not drinking water and instead drinking a good deal of seet tea. Also notes several days of increased fatigue. Denies CP/palp/SOB/HA/congestion/fevers/GI or GU c/o. Taking meds as prescribed  Past Medical History  Diagnosis Date  . Allergy     seasonal- mostly spring  . TOBACCO ABUSE, HX OF 03/17/2010  . Overweight(278.02) 07/01/2010  . Personal history of other infectious and parasitic disease 03/17/2010  . Mixed hyperlipidemia 03/17/2010  . HYPERKALEMIA 05/27/2010  . FATIGUE 03/17/2010  . CHICKENPOX, HX OF 03/17/2010  . CAROTID ARTERY OCCLUSION, WITH INFARCTION 03/17/2010  . Hearing loss 11/04/2010    bilateral  . Anemia 02/03/2011  . Hypertension   . Tubular adenoma of colon 06/10/2011  . Hypothyroid 12/08/2011  . BCC (basal cell carcinoma) 12/08/2011  . Thrombocytopenia ([place]) 04/05/2012  . ASTHMA, CHILDHOOD 03/17/2010    mostly as child  . Sleep apnea     uses mouthpiece only  . CEREBROVASCULAR ACCIDENT 03/17/2010    partial loss of peripheral vision on left-"slight improved"  . Epistaxis 09/06/2013  . Skin lesion of right arm 11/20/2014  . Oral lesion 11/20/2014  . Constipation 12/08/2014    Past Surgical History  Procedure Laterality Date  . Tonsillectomy    . Pilonidal cyst excision  1968    removed  . Knee cartliage repair  1964, 1990    bilateral  . Rotator cuff repair      right  . Endarterectomy Left 02/13/2013    Procedure: ENDARTERECTOMY CAROTID;  Surgeon: Rosetta Posner, MD;  Location: [facility];  Service: Vascular;  Laterality: Left;  . Eus N/A 08/17/2013    Procedure: UPPER ENDOSCOPIC  ULTRASOUND (EUS) LINEAR;  Surgeon: Milus Banister, MD;  Location: [facility];  Service: Endoscopy;  Laterality: N/A;  . Carotid endarterectomy Left February 13, 2013    cea  . Colonoscopy  Dec. 2014  . Upper gi endoscopy  Jan. 2015    Family History  Problem Relation Age of Onset  . Alzheimer's disease Mother   . Emphysema Sister     cigarettes  . Heart disease Son   . Coronary artery disease Brother   . Heart attack Brother   . Other Brother     heart problems- from fathers side  . Cancer Brother   . Heart disease Brother 52    Heart disease before age 74  . Cancer Sister     gyn  . Heart disease Sister   . Bipolar disorder Daughter   . Obesity Daughter   . Heart disease Sister   . Stroke Father     Social History   Social History  . Marital Status: Divorced    Spouse Name: N/A  . Number of Children: 3  . Years of Education: 37yrs   Occupational History  . retired    Social History Main Topics  . Smoking status: Former Smoker -- 2.00 packs/day for 20 years    Types: Cigarettes    Quit date: 07/19/1977  . Smokeless tobacco: Never Used  . Alcohol  Use: 4.2 oz/week    7 Glasses of wine per week     Comment: dinner wine daily  . Drug Use: No  . Sexual Activity: No   Other Topics Concern  . Not on file   Social History Narrative   Pt lives at home alone.    Caffeine Use: very little    Outpatient Prescriptions Prior to Visit  Medication Sig Dispense Refill  . aspirin EC 81 MG tablet Take 81 mg by mouth daily.    [name] b complex vitamins tablet Take 1 tablet by mouth daily.    . chlorthalidone (HYGROTON) 25 MG tablet Take 1 tablet by mouth  every morning 90 tablet 1  . chlorthalidone (HYGROTON) 25 MG tablet Take 1 tablet by mouth  every morning 90 tablet 2  . Cholecalciferol (VITAMIN D PO) Take 2,000 mg by mouth daily.     . clopidogrel (PLAVIX) 75 MG tablet Take 1 tablet by mouth  daily with breakfast 90 tablet 2  . Coenzyme Q10 (CO Q-10) 300 MG CAPS Take 1  capsule by mouth daily.    . Glucosamine-Chondroit-Vit C-Mn (GLUCOSAMINE 1500 COMPLEX PO) Take 1,500 mg by mouth daily.    [name] levothyroxine (SYNTHROID, LEVOTHROID) 75 MCG tablet Take 1 tablet by mouth  daily 90 tablet 2  . magnesium oxide (MAG-OX) 400 MG tablet Take 400 mg by mouth daily.    [name] OVER THE COUNTER MEDICATION Take 1 capsule by mouth daily. Mega red 500 mg once daily    . Probiotic Product (PROBIOTIC PO) Take 1 capsule by mouth daily.     . ramipril (ALTACE) 10 MG capsule Take 1 capsule by mouth two times daily 180 capsule 2  . rosuvastatin (CRESTOR) 40 MG tablet Take 1 tablet by mouth  every evening 90 tablet 2  . pantoprazole (PROTONIX) 40 MG tablet Take 1 tablet by mouth  daily 90 tablet 2   No facility-administered medications prior to visit.    No Known Allergies  Review of Systems  Constitutional: Positive for malaise/fatigue. Negative for fever.  HENT: Negative for congestion.   Eyes: Negative for blurred vision.  Respiratory: Negative for shortness of breath.   Cardiovascular: Negative for chest pain, palpitations and leg swelling.  Gastrointestinal: Negative for nausea, abdominal pain and blood in stool.  Genitourinary: Negative for dysuria and frequency.  Musculoskeletal: Negative for falls.  Skin: Negative for rash.  Neurological: Negative for dizziness, loss of consciousness and headaches.  Endo/Heme/Allergies: Negative for environmental allergies.  Psychiatric/Behavioral: Negative for depression. The patient is not nervous/anxious.        Objective:    Physical Exam  Constitutional: He is oriented to person, place, and time. He appears well-developed and well-nourished. No distress.  HENT:  Head: Normocephalic and atraumatic.  Nose: Nose normal.  Eyes: Right eye exhibits no discharge. Left eye exhibits no discharge.  Neck: Normal range of motion. Neck supple.  Cardiovascular: Normal rate and regular rhythm.   No murmur heard. Pulmonary/Chest: Effort  normal and breath sounds normal.  Abdominal: Soft. Bowel sounds are normal. There is no tenderness.  Musculoskeletal: He exhibits no edema.  Neurological: He is alert and oriented to person, place, and time.  Skin: Skin is warm and dry.  Psychiatric: He has a normal mood and affect.  Nursing note and vitals reviewed.   BP 132/86 mmHg  Pulse 57  Temp(Src) 98.4 F (36.9 C) (Oral)  Ht 6\' 2"  (1.88 m)  Wt 207 lb 4 oz (94.008 kg)  BMI  26.60 kg/m2  SpO2 97% Wt Readings from Last 3 Encounters:  01/07/16 207 lb 4 oz (94.008 kg)  06/04/15 204 lb (92.534 kg)  04/04/15 199 lb 3.2 oz (90.357 kg)     Lab Results  Component Value Date   WBC 5.4 12/03/2015   HGB 14.1 12/03/2015   HCT 42.1 12/03/2015   PLT 167.0 12/03/2015   GLUCOSE 93 12/03/2015   CHOL 160 12/03/2015   TRIG 95.0 12/03/2015   HDL 47.30 12/03/2015   LDLCALC 94 12/03/2015   ALT 13 12/03/2015   AST 16 12/03/2015   NA 140 12/03/2015   K 4.9 12/03/2015   CL 103 12/03/2015   CREATININE 1.06 12/03/2015   BUN 19 12/03/2015   CO2 31 12/03/2015   TSH 3.59 12/03/2015   PSA 0.39 05/08/2014   INR 0.99 02/07/2013   HGBA1C 5.8 12/03/2015   MICROALBUR 2.0* 12/03/2015    Lab Results  Component Value Date   TSH 3.59 12/03/2015   Lab Results  Component Value Date   WBC 5.4 12/03/2015   HGB 14.1 12/03/2015   HCT 42.1 12/03/2015   MCV 94.2 12/03/2015   PLT 167.0 12/03/2015   Lab Results  Component Value Date   NA 140 12/03/2015   K 4.9 12/03/2015   CO2 31 12/03/2015   GLUCOSE 93 12/03/2015   BUN 19 12/03/2015   CREATININE 1.06 12/03/2015   BILITOT 0.9 12/03/2015   ALKPHOS 56 12/03/2015   AST 16 12/03/2015   ALT 13 12/03/2015   PROT 6.8 12/03/2015   ALBUMIN 4.6 12/03/2015   CALCIUM 10.0 12/03/2015   GFR 71.65 12/03/2015   Lab Results  Component Value Date   CHOL 160 12/03/2015   Lab Results  Component Value Date   HDL 47.30 12/03/2015   Lab Results  Component Value Date   LDLCALC 94 12/03/2015    Lab Results  Component Value Date   TRIG 95.0 12/03/2015   Lab Results  Component Value Date   CHOLHDL 3 12/03/2015   Lab Results  Component Value Date   HGBA1C 5.8 12/03/2015       Assessment & Plan:   Problem List Items Addressed This Visit    Overweight    Encouraged DASH diet, decrease po intake and increase exercise as tolerated. Needs 7-8 hours of sleep nightly. Avoid trans fats, eat small, frequent meals every 4-5 hours with lean proteins, complex carbs and healthy fats. Minimize simple carbs      Mixed hyperlipidemia    Encouraged heart healthy diet, increase exercise, avoid trans fats, consider a krill oil cap daily      Hypothyroid    On Levothyroxine, continue to monitor      Essential hypertension, benign - Primary    Well controlled, no changes to meds. Encouraged heart healthy diet such as the DASH diet and exercise as tolerated.          I am having Mr. Munkres maintain his Co Q-10, Cholecalciferol (VITAMIN D PO), b complex vitamins, OVER THE COUNTER MEDICATION, Probiotic Product (PROBIOTIC PO), aspirin EC, Glucosamine-Chondroit-Vit C-Mn (GLUCOSAMINE 1500 COMPLEX PO), chlorthalidone, magnesium oxide, chlorthalidone, clopidogrel, levothyroxine, ramipril, rosuvastatin, and pantoprazole.  Meds ordered this encounter  Medications  . pantoprazole (PROTONIX) 40 MG tablet    Sig: Take 1 tablet by mouth  daily    Dispense:  90 tablet    Refill:  3     Penni Homans, MD

## 2016-01-12 NOTE — Assessment & Plan Note (Signed)
Well controlled, no changes to meds. Encouraged heart healthy diet such as the DASH diet and exercise as tolerated.  °

## 2016-01-12 NOTE — Assessment & Plan Note (Signed)
Encouraged heart healthy diet, increase exercise, avoid trans fats, consider a krill oil cap daily 

## 2016-03-18 ENCOUNTER — Encounter: Payer: Self-pay | Admitting: Family Medicine

## 2016-03-19 ENCOUNTER — Other Ambulatory Visit: Payer: Self-pay | Admitting: Family Medicine

## 2016-03-19 DIAGNOSIS — R739 Hyperglycemia, unspecified: Secondary | ICD-10-CM

## 2016-03-19 DIAGNOSIS — E785 Hyperlipidemia, unspecified: Secondary | ICD-10-CM

## 2016-04-02 ENCOUNTER — Encounter: Payer: Self-pay | Admitting: Family

## 2016-04-07 ENCOUNTER — Encounter: Payer: Self-pay | Admitting: Family

## 2016-04-07 ENCOUNTER — Ambulatory Visit (INDEPENDENT_AMBULATORY_CARE_PROVIDER_SITE_OTHER): Payer: Medicare Other | Admitting: Family

## 2016-04-07 ENCOUNTER — Ambulatory Visit (HOSPITAL_COMMUNITY)
Admission: RE | Admit: 2016-04-07 | Discharge: 2016-04-07 | Disposition: A | Discharge status: Home / Self Care | Payer: Medicare Other | Source: Ambulatory Visit | Attending: Family | Admitting: Family

## 2016-04-07 VITALS — BP 160/77 | HR 51 | Temp 97.5°F | Resp 16 | Ht 74.0 in | Wt 206.0 lb

## 2016-04-07 DIAGNOSIS — I6521 Occlusion and stenosis of right carotid artery: Secondary | ICD-10-CM | POA: Diagnosis not present

## 2016-04-07 DIAGNOSIS — I6522 Occlusion and stenosis of left carotid artery: Secondary | ICD-10-CM

## 2016-04-07 DIAGNOSIS — Z87891 Personal history of nicotine dependence: Secondary | ICD-10-CM

## 2016-04-07 DIAGNOSIS — Z9889 Other specified postprocedural states: Secondary | ICD-10-CM | POA: Diagnosis not present

## 2016-04-07 DIAGNOSIS — Z48812 Encounter for surgical aftercare following surgery on the circulatory system: Secondary | ICD-10-CM | POA: Insufficient documentation

## 2016-04-07 LAB — VAS US CAROTID
LCCADDIAS: 34 cm/s
LEFT ECA DIAS: -11 cm/s
LEFT VERTEBRAL DIAS: -24 cm/s
LICADDIAS: -33 cm/s
LICADSYS: -87 cm/s
LICAPDIAS: 21 cm/s
LICAPSYS: 126 cm/s
Left CCA dist sys: 118 cm/s
Left CCA prox dias: 28 cm/s
Left CCA prox sys: 149 cm/s
RCCAPSYS: -73 cm/s
RIGHT CCA MID DIAS: 1 cm/s
RIGHT ECA DIAS: -2 cm/s
RIGHT VERTEBRAL DIAS: -13 cm/s
Right CCA prox dias: -3 cm/s

## 2016-04-07 NOTE — Progress Notes (Signed)
Chief Complaint: Follow up Extracranial Carotid Artery Stenosis   History of Present Illness  Tanner [name] is a 81 y.o. male patient of [name] who is s/p left carotid endarterectomy on 02/13/2013 for severe asymptomatic disease.  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 He returns today for follow up. He had a TIA in 2011 or 2012 as manifested by transient lateral loss of vision in left eye, denies any other neurological symptoms, specifically denies hemiparesis, denies aphasia symptoms. He specifically denies any further transient ischemic attacks or stroke activity.  Patient states he was evaluated by [name], his current neurologist is [name].  He no longer feels the numbness in his left arm that he was having. This seems to be mostly positional and can occur when he first arises in the morning.  He is left-handed.   He denies feeling light headed other than when he gets up too quick, denies chest pain or dyspnea.   He states his blood pressure increases in a doctor office, states his systolic pressure is usually about 140. He denies claudication in legs with walking, denies non healing wounds.  Pt Diabetic: No Pt smoker: former smoker, quit in 1978  Pt meds include: Statin : Yes ASA: Yes Other anticoagulants/antiplatelets: Plavix    Past Medical History:  Diagnosis Date  . Allergy    seasonal- mostly spring  . Anemia 02/03/2011  . ASTHMA, CHILDHOOD 03/17/2010   mostly as child  . BCC (basal cell carcinoma) 12/08/2011  . CAROTID ARTERY OCCLUSION, WITH INFARCTION 03/17/2010  . CEREBROVASCULAR ACCIDENT 03/17/2010   partial loss of peripheral vision on left-"slight improved"  . CHICKENPOX, HX OF 03/17/2010  . Constipation 12/08/2014  . Epistaxis 09/06/2013  . FATIGUE 03/17/2010  . Hearing loss 11/04/2010   bilateral  . HYPERKALEMIA 05/27/2010  . Hypertension   . Hypothyroid 12/08/2011  . Mixed hyperlipidemia 03/17/2010  . Oral lesion  11/20/2014  . Overweight(278.02) 07/01/2010  . Personal history of other infectious and parasitic disease 03/17/2010  . Skin lesion of right arm 11/20/2014  . Sleep apnea    uses mouthpiece only  . Thrombocytopenia ([place]) 04/05/2012  . TOBACCO ABUSE, HX OF 03/17/2010  . Tubular adenoma of colon 06/10/2011    Social History Social History  Substance Use Topics  . Smoking status: Former Smoker    Packs/day: 2.00    Years: 20.00    Types: Cigarettes    Quit date: 07/19/1977  . Smokeless tobacco: Never Used  . Alcohol use 4.2 oz/week    7 Glasses of wine per week     Comment: dinner wine daily    Family History Family History  Problem Relation Age of Onset  . Alzheimer's disease Mother   . Emphysema Sister     cigarettes  . Heart disease Son   . Coronary artery disease Brother   . Heart attack Brother   . Other Brother     heart problems- from fathers side  . Cancer Brother   . Heart disease Brother 10    Heart disease before age 45  . Cancer Sister     gyn  . Heart disease Sister   . Bipolar disorder Daughter   . Obesity Daughter   . Heart disease Sister   . Stroke Father     Surgical History Past Surgical History:  Procedure Laterality Date  . CAROTID ENDARTERECTOMY Left February 13, 2013   cea  . COLONOSCOPY  Dec. 2014  . ENDARTERECTOMY  Left 02/13/2013   Procedure: ENDARTERECTOMY CAROTID;  Surgeon: Rosetta Posner, MD;  Location: [facility];  Service: Vascular;  Laterality: Left;  . EUS N/A 08/17/2013   Procedure: UPPER ENDOSCOPIC ULTRASOUND (EUS) LINEAR;  Surgeon: Milus Banister, MD;  Location: [facility];  Service: Endoscopy;  Laterality: N/A;  . knee cartliage repair  1964, 1990   bilateral  . [place]   removed  . ROTATOR CUFF REPAIR     right  . TONSILLECTOMY    . UPPER GI ENDOSCOPY  Jan. 2015    No Known Allergies  Current Outpatient Prescriptions  Medication Sig Dispense Refill  . aspirin EC 81 MG tablet Take 81 mg by mouth daily.    [name]  b complex vitamins tablet Take 1 tablet by mouth daily.    . chlorthalidone (HYGROTON) 25 MG tablet Take 1 tablet by mouth  every morning 90 tablet 1  . Cholecalciferol (VITAMIN D PO) Take 2,000 mg by mouth daily.     . clopidogrel (PLAVIX) 75 MG tablet Take 1 tablet by mouth  daily with breakfast 90 tablet 2  . Coenzyme Q10 (CO Q-10) 300 MG CAPS Take 1 capsule by mouth daily.    . Glucosamine-Chondroit-Vit C-Mn (GLUCOSAMINE 1500 COMPLEX PO) Take 1,500 mg by mouth daily.     [name] levothyroxine (SYNTHROID, LEVOTHROID) 75 MCG tablet Take 1 tablet by mouth  daily 90 tablet 2  . magnesium oxide (MAG-OX) 400 MG tablet Take 400 mg by mouth daily.    [name] MEGARED OMEGA-3 KRILL OIL 500 MG CAPS Take 1,000 mg by mouth.    . pantoprazole (PROTONIX) 40 MG tablet Take 1 tablet by mouth  daily 90 tablet 3  . Probiotic Product (PROBIOTIC PO) Take 1 capsule by mouth daily.     . ramipril (ALTACE) 10 MG capsule Take 1 capsule by mouth two times daily 180 capsule 2  . rosuvastatin (CRESTOR) 40 MG tablet Take 1 tablet by mouth  every evening 90 tablet 2  . Turmeric (CURCUMIN 95) 500 MG CAPS Take 500 mg by mouth.     No current facility-administered medications for this visit.     Review of Systems : See HPI for pertinent positives and negatives.  Physical Examination  Vitals:   04/07/16 0959 04/07/16 1003  BP: (!) 151/69 (!) 160/77  Pulse: (!) 51   Resp: 16   Temp: 97.5 F (36.4 C)   TempSrc: Oral   SpO2: 100%   Weight: 206 lb (93.4 kg)   Height: 6\' 2"  (1.88 m)    Body mass index is 26.45 kg/m.  General: WDWN male in NAD GAIT: normal Eyes: PERRLA Pulmonary: Non-labored, CTAB, no rales, no rhonchi, & no wheezing.  Cardiac: regular rhythm, bradycardic rate, no detected murmur.  VASCULAR EXAM Carotid Bruits Right Left   positive Negative   Aorta is not palpable. Radial pulses are 2+ palpable and equal.       LE Pulses Right Left   POPLITEAL not palpable  not palpable   POSTERIOR TIBIAL not palpable  2+ palpable    DORSALIS PEDIS  ANTERIOR TIBIAL 2+ palpable  2+ palpable     Gastrointestinal: soft, nontender, BS WNL, no r/g, no palpable masses.  Musculoskeletal: No muscle atrophy/wasting. M/S 5/5 throughout, Extremities without ischemic changes.  Neurologic: A&O X 3; Appropriate Affect, Speech is normal CN 2-12 intact, Pain and light touch intact in extremities, Motor exam as listed above.      Assessment: Tanner [name]  is a 81 y.o. male who is s/p left carotid endarterectomy on 02/13/2013 for severe asymptomatic disease.  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 He had a TIA in 2011 or 2012 as manifested by transient lateral loss of vision in left eye, no other neurological symptoms, no subsequent TIA or stroke activity.  DATA Today's carotid Duplex suggests known occlusion of the right internal carotid artery. Right common carotid artery non-occluding thrombus/plaque present throughout. Patent left carotid endarterectomy site with evidence of mild hyperplasia at the proximal and distal patch. No significant change in comparison to the last exams on 03/22/2014 and 04/02/15.   Plan: Follow-up in 1 year with Carotid Duplex scan.   I discussed in depth with the patient the nature of atherosclerosis, and emphasized the importance of maximal medical management including strict control of blood pressure, blood glucose, and lipid levels, obtaining regular exercise, and continued cessation of smoking.  The patient is aware that without maximal medical management the underlying atherosclerotic disease process will progress, limiting the benefit of any interventions. The patient was given information about stroke prevention and  what symptoms should prompt the patient to seek immediate medical care. Thank you for allowing [place] to participate in this patient's care.  Clemon Chambers, RN, MSN, FNP-C [facility] Office: [phone]  Clinic Physician: Early  04/07/16 10:17 AM

## 2016-04-07 NOTE — Patient Instructions (Signed)
Stroke Prevention °Some health problems and behaviors may make it more likely for you to have a stroke. Below are ways to lessen your risk of having a stroke.  °· Be active for at least 30 minutes on most or all days. °· Do not smoke. Try not to be around others who smoke. °· Do not drink too much alcohol. °¨ Do not have more than 2 drinks a day if you are a man. °¨ Do not have more than 1 drink a day if you are a woman and are not pregnant. °· Eat healthy foods, such as fruits and vegetables. If you were put on a specific diet, follow the diet as told. °· Keep your cholesterol levels under control through diet and medicines. Look for foods that are low in saturated fat, trans fat, cholesterol, and are high in fiber. °· If you have diabetes, follow all diet plans and take your medicine as told. °· Ask your doctor if you need treatment to lower your blood pressure. If you have high blood pressure (hypertension), follow all diet plans and take your medicine as told by your doctor. °· If you are 18-39 years old, have your blood pressure checked every 3-5 years. If you are age 40 or older, have your blood pressure checked every year. °· Keep a healthy weight. Eat foods that are low in calories, salt, saturated fat, trans fat, and cholesterol. °· Do not take drugs. °· Avoid birth control pills, if this applies. Talk to your doctor about the risks of taking birth control pills. °· Talk to your doctor if you have sleep problems (sleep apnea). °· Take all medicine as told by your doctor. °¨ You may be told to take aspirin or blood thinner medicine. Take this medicine as told by your doctor. °¨ Understand your medicine instructions. °· Make sure any other conditions you have are being taken care of. °GET HELP RIGHT AWAY IF: °· You suddenly lose feeling (you feel numb) or have weakness in your face, arm, or leg. °· Your face or eyelid hangs down to one side. °· You suddenly feel confused. °· You have trouble talking (aphasia)  or understanding what people are saying. °· You suddenly have trouble seeing in one or both eyes. °· You suddenly have trouble walking. °· You are dizzy. °· You lose your balance or your movements are clumsy (uncoordinated). °· You suddenly have a very bad headache and you do not know the cause. °· You have new chest pain. °· Your heart feels like it is fluttering or skipping a beat (irregular heartbeat). °Do not wait to see if the symptoms above go away. Get help right away. Call your local emergency services (911 in [place] Do not drive yourself to the hospital. °  °This information is not intended to replace advice given to you by your health care provider. Make sure you discuss any questions you have with your health care provider. °  °Document Released: 01/05/2012 Document Revised: 07/27/2014 Document Reviewed: 01/06/2013 °Elsevier Interactive Patient Education ©2016 [employer] ° °

## 2016-04-09 ENCOUNTER — Ambulatory Visit (INDEPENDENT_AMBULATORY_CARE_PROVIDER_SITE_OTHER): Payer: Medicare Other | Admitting: Neurology

## 2016-04-09 ENCOUNTER — Other Ambulatory Visit (INDEPENDENT_AMBULATORY_CARE_PROVIDER_SITE_OTHER): Payer: Medicare Other

## 2016-04-09 ENCOUNTER — Encounter: Payer: Self-pay | Admitting: Neurology

## 2016-04-09 VITALS — BP 140/86 | HR 78 | Ht 74.0 in | Wt 203.0 lb

## 2016-04-09 DIAGNOSIS — I63231 Cerebral infarction due to unspecified occlusion or stenosis of right carotid arteries: Secondary | ICD-10-CM

## 2016-04-09 DIAGNOSIS — R252 Cramp and spasm: Secondary | ICD-10-CM

## 2016-04-09 DIAGNOSIS — G609 Hereditary and idiopathic neuropathy, unspecified: Secondary | ICD-10-CM | POA: Diagnosis not present

## 2016-04-09 LAB — TSH: TSH: 2.09 u[IU]/mL (ref 0.35–4.50)

## 2016-04-09 LAB — MAGNESIUM: MAGNESIUM: 2 mg/dL (ref 1.5–2.5)

## 2016-04-09 LAB — VITAMIN B12: VITAMIN B 12: 855 pg/mL (ref 211–911)

## 2016-04-09 LAB — FOLATE

## 2016-04-09 NOTE — Patient Instructions (Signed)
1.  Continue aspirin and Plavix 2.  For nerve and leg discomfort, we will check Mg, B12, folate and TSH.  We will check nerve study of both legs. 3.  Try drinking tonic water with quinine in the evening to help with leg cramps 4.  Follow up afterwards.

## 2016-04-09 NOTE — Progress Notes (Signed)
NEUROLOGY FOLLOW UP OFFICE NOTE  Tanner [name] [id number]  HISTORY OF PRESENT ILLNESS: Tanner [name] is a 81 year old left-handed man with history of hypertension, hyperlipidemia, hypothyroidism, OSA and former smoker who follows up for right MCA/PCA watershed infarct, carotid artery disease with right ICA occlusion and status post left CEA, as well as concerns of lower extremity cramps and numbness.  History obtained by patient and recent vascular surgery note.  Recent carotid doppler report and labs reviewed.   UPDATE: Labs from 12/03/15 showed lipid panel with total cholesterol 160, TG 95, HDL 47.30 and LDL 94; Hgb A1c 5.8.   He followed up with vascular surgery on 04/07/16.   Carotid doppler performed then again revealed known right ICA occlusion with patent left carotid endarterectomy site, not significantly changed compared to last exam.  He reports a new issue as well, which is chronic.  He reports numbness on the bottom of his feet, mostly his toes.  He also has a discomfort involving the left shin, but it is not a pain.  He says she sometimes catches his toe of his left foot on the ground and may stumble.  This has been ongoing since his stroke.  He also reports having fractured his left ankle twice.  He does report crossing his legs.  He notes muscle cramps in the legs, which bother him at night.    HISTORY: In August 2011, he had a stroke, presenting as left visual field cut.  He had a right hemispheric stroke thought to be secondary to right ICA occlusion.  MRI of brain revealed an acute watershed infarct in the right PCA/MCA distributions.  CTA of the neck confirmed right common carotid artery occlusion at the origin.  The left ICA revealed approximately 50% stenosis just distal to the bulb.  CTA of the head revealed occluded right ICA with partial retrograde constitution from the left INCA via the PCA.  There was opacification of the right MCA and right ACA from the left ICA via  the Acomm.  There was occlusion of the right PCA at its origin.  Hgb A1c at that time was 5.6 and LDL was 210.   He was placed on Plavix.  Follow up carotid dopplers revealed increased stenosis of the left ICA.  Carotid doppler from 01/03/13 revealed more than 70% stenosis involving the left bulb and left proximal ICA and 50-69% stenosis involving the left mid ICA, but without plaque.  Stenosis of left ECA and left VA noted.  CTA of the neck performed on 01/19/13 revealed 75-80% stenosis of the left ICA.  He underwent a left CEA on 02/13/13.  He continues to follow up with [name] from vascular surgery.   04/02/15 Carotid doppler:  Right ICA occluded.  Left ICA patent.  No plaque.09/28/13 Hgb A1c 5.7 02/07/13 2D Echo:  LVEF 55-65%, no regional wall motion abnormalities, mild MVR, mildly dilated left atrium, mildly dilated right ventricle, mildly increased systolic pressure in pulmonary arteries (PA peak pressure 39mm Hg).   He currently takes Plavix 75mg , ASA 81mg , Crestor  PAST MEDICAL HISTORY: Past Medical History:  Diagnosis Date  . Allergy    seasonal- mostly spring  . Anemia 02/03/2011  . ASTHMA, CHILDHOOD 03/17/2010   mostly as child  . BCC (basal cell carcinoma) 12/08/2011  . CAROTID ARTERY OCCLUSION, WITH INFARCTION 03/17/2010  . CEREBROVASCULAR ACCIDENT 03/17/2010   partial loss of peripheral vision on left-"slight improved"  . CHICKENPOX, HX OF 03/17/2010  . Constipation 12/08/2014  .  Epistaxis 09/06/2013  . FATIGUE 03/17/2010  . Hearing loss 11/04/2010   bilateral  . HYPERKALEMIA 05/27/2010  . Hypertension   . Hypothyroid 12/08/2011  . Mixed hyperlipidemia 03/17/2010  . Oral lesion 11/20/2014  . Overweight(278.02) 07/01/2010  . Personal history of other infectious and parasitic disease 03/17/2010  . Skin lesion of right arm 11/20/2014  . Sleep apnea    uses mouthpiece only  . Thrombocytopenia ([place]) 04/05/2012  . TOBACCO ABUSE, HX OF 03/17/2010  . Tubular adenoma of colon 06/10/2011     MEDICATIONS: Current Outpatient Prescriptions on File Prior to Visit  Medication Sig Dispense Refill  . aspirin EC 81 MG tablet Take 81 mg by mouth daily.    [name] b complex vitamins tablet Take 1 tablet by mouth daily.    . chlorthalidone (HYGROTON) 25 MG tablet Take 1 tablet by mouth  every morning 90 tablet 1  . Cholecalciferol (VITAMIN D PO) Take 2,000 mg by mouth daily.     . clopidogrel (PLAVIX) 75 MG tablet Take 1 tablet by mouth  daily with breakfast 90 tablet 2  . Coenzyme Q10 (CO Q-10) 300 MG CAPS Take 1 capsule by mouth daily.    . Glucosamine-Chondroit-Vit C-Mn (GLUCOSAMINE 1500 COMPLEX PO) Take 1,500 mg by mouth daily.     [name] levothyroxine (SYNTHROID, LEVOTHROID) 75 MCG tablet Take 1 tablet by mouth  daily 90 tablet 2  . magnesium oxide (MAG-OX) 400 MG tablet Take 400 mg by mouth daily.    [name] MEGARED OMEGA-3 KRILL OIL 500 MG CAPS Take 1,000 mg by mouth.    . pantoprazole (PROTONIX) 40 MG tablet Take 1 tablet by mouth  daily 90 tablet 3  . Probiotic Product (PROBIOTIC PO) Take 1 capsule by mouth daily.     . ramipril (ALTACE) 10 MG capsule Take 1 capsule by mouth two times daily 180 capsule 2  . rosuvastatin (CRESTOR) 40 MG tablet Take 1 tablet by mouth  every evening 90 tablet 2  . Turmeric (CURCUMIN 95) 500 MG CAPS Take 500 mg by mouth.     No current facility-administered medications on file prior to visit.     ALLERGIES: No Known Allergies  FAMILY HISTORY: Family History  Problem Relation Age of Onset  . Alzheimer's disease Mother   . Emphysema Sister     cigarettes  . Heart disease Son   . Coronary artery disease Brother   . Heart attack Brother   . Other Brother     heart problems- from fathers side  . Cancer Brother   . Heart disease Brother 43    Heart disease before age 51  . Cancer Sister     gyn  . Heart disease Sister   . Bipolar disorder Daughter   . Obesity Daughter   . Heart disease Sister   . Stroke Father     SOCIAL HISTORY: Social History    Social History  . Marital status: Divorced    Spouse name: N/A  . Number of children: 3  . Years of education: 26yrs   Occupational History  . retired Retired   Social History Main Topics  . Smoking status: Former Smoker    Packs/day: 2.00    Years: 20.00    Types: Cigarettes    Quit date: 07/19/1977  . Smokeless tobacco: Never Used  . Alcohol use 4.2 oz/week    7 Glasses of wine per week     Comment: dinner wine daily  . Drug use: No  . Sexual  activity: No   Other Topics Concern  . Not on file   Social History Narrative   Pt lives at home alone.    Caffeine Use: very little    REVIEW OF SYSTEMS: Constitutional: No fevers, chills, or sweats, no generalized fatigue, change in appetite Eyes: No visual changes, double vision, eye pain Ear, nose and throat: No hearing loss, ear pain, nasal congestion, sore throat Cardiovascular: No chest pain, palpitations Respiratory:  No shortness of breath at rest or with exertion, wheezes GastrointestinaI: No nausea, vomiting, diarrhea, abdominal pain, fecal incontinence Genitourinary:  No dysuria, urinary retention or frequency Musculoskeletal:  No neck pain, back pain Integumentary: No rash, pruritus, skin lesions Neurological: as above Psychiatric: No depression, insomnia, anxiety Endocrine: No palpitations, fatigue, diaphoresis, mood swings, change in appetite, change in weight, increased thirst Hematologic/Lymphatic:  No purpura, petechiae. Allergic/Immunologic: no itchy/runny eyes, nasal congestion, recent allergic reactions, rashes  PHYSICAL EXAM: Vitals:   04/09/16 0921  BP: 140/86  Pulse: 78   General: No acute distress.  Patient appears well-groomed.  normal body habitus. Head:  Normocephalic/atraumatic Eyes:  Fundi examined but not visualized Neck: supple, no paraspinal tenderness, full range of motion Heart:  Regular rate and rhythm Lungs:  Clear to auscultation bilaterally Back: No paraspinal  tenderness Neurological Exam: alert and oriented to person, place, and time. Attention span and concentration intact, recent and remote memory intact, fund of knowledge intact.  Speech fluent and not dysarthric, language intact.  Left homonymous hemianopsia.  Otherwise, CN II-XII intact. Bulk and tone normal, muscle strength 5/5 throughout.  Sensation to pinprick reduced in toes.  Vibration sensation reduced in the feet.  Deep tendon reflexes 2+ throughout, toes downgoing.  Finger to nose and heel to shin testing intact.  Gait normal, Romberg negative.  IMPRESSION: Right MCA/PCA watershed infarct, secondary to right ICA occlusion and right PCA occlusion. Asymptomatic left ICA disease status post left CEA, stable Hyperlipidemia Hypertension Neuropathy and muscle cramps of lower extremities.  PLAN: 1.  Will check Mg, B12, folate and TSH.  Will also check NCV-EMG 2.  Continue dual antiplatelet therapy for secondary stroke prevention 3.  Continue statin therapy as managed by PCP.  LDL goal should be less than 70. 4.  Mediterranean diet 5.  Recommend tonic water for cramps 6.  Follow up after neuropathy testing  Metta Clines, DO  CC: Penni Homans, MD

## 2016-04-10 ENCOUNTER — Other Ambulatory Visit: Payer: Self-pay | Admitting: *Deleted

## 2016-04-10 ENCOUNTER — Encounter: Payer: Self-pay | Admitting: Neurology

## 2016-04-10 ENCOUNTER — Telehealth: Payer: Self-pay

## 2016-04-10 DIAGNOSIS — R200 Anesthesia of skin: Secondary | ICD-10-CM

## 2016-04-10 NOTE — Telephone Encounter (Signed)
Reply forwarded (mychart) to pt.

## 2016-04-10 NOTE — Telephone Encounter (Signed)
Normal range is greater than 5.9, so it is normal

## 2016-04-10 NOTE — Telephone Encounter (Signed)
Please see mychart message and advise.

## 2016-04-10 NOTE — Telephone Encounter (Signed)
-----   Message from Pieter Partridge, DO sent at 04/10/2016  9:53 AM EDT ----- Labs are normal

## 2016-04-10 NOTE — Telephone Encounter (Signed)
Released via mychart

## 2016-04-13 ENCOUNTER — Other Ambulatory Visit: Payer: Self-pay | Admitting: Family Medicine

## 2016-04-13 ENCOUNTER — Ambulatory Visit (INDEPENDENT_AMBULATORY_CARE_PROVIDER_SITE_OTHER): Payer: Medicare Other | Admitting: Behavioral Health

## 2016-04-13 DIAGNOSIS — Z23 Encounter for immunization: Secondary | ICD-10-CM

## 2016-04-13 NOTE — Progress Notes (Addendum)
Pre visit review using our clinic review tool, if applicable. No additional management support is needed unless otherwise documented below in the visit note.  Patient in office today for Influenza vaccination. IM given in Right Deltoid. Patient tolerated injection well.

## 2016-04-28 ENCOUNTER — Ambulatory Visit (INDEPENDENT_AMBULATORY_CARE_PROVIDER_SITE_OTHER): Payer: Medicare Other | Admitting: Neurology

## 2016-04-28 DIAGNOSIS — G609 Hereditary and idiopathic neuropathy, unspecified: Secondary | ICD-10-CM

## 2016-04-28 DIAGNOSIS — R200 Anesthesia of skin: Secondary | ICD-10-CM

## 2016-04-28 NOTE — Procedures (Signed)
[facility]  [address], [address]  [place], [place] [address] Tel: [phone] Fax:  [phone] Test Date:  04/28/2016  Patient: Tanner [name] DOB: 1935-06-16 Physician: Narda Amber, DO  Sex: Male Height: 6\' 2"  Ref Phys: Metta Clines, M.D.  ID#: [id number] Temp: 33.0C Technician: Jerilynn Mages. Dean   Patient Complaints: This 81 year old gentleman referred for evaluation of bilateral feet numbness and left leg cramps.  NCV & EMG Findings: Extensive electrodiagnostic testing of the left lower extremity and additional studies of the right shows: 1. Bilateral sural and superficial peroneal sensory responses are absent. 2. Bilateral peroneal motor responses show reduced amplitude, worse on the right, and slowed conduction velocity. The tibial motor response on the left shows normal amplitude with borderline slowed conduction velocity and on the right, the amplitude is reduced. 3. Bilateral tibial H reflex studies are absent. 4. Chronic motor axon loss changes are seen affecting the muscles below the knee bilaterally as well as the left biceps femoris short head muscle.  There is no evidence of active denervation.  Impression: 1. The electrophysiologic findings are most consistent with a chronic, distal and symmetric sensorimotor polyneuropathy, predominately axonal loss in type, affecting the lower extremities. 2. Superimposed S1 radiculopathy affecting the left lower extremity is also likely. Clinical correlation recommended.   ___________________________ Narda Amber, DO    Nerve Conduction Studies Anti Sensory Summary Table   Stim Site NR Peak (ms) Norm Peak (ms) P-T Amp (V) Norm P-T Amp  Left Sup Peroneal Anti Sensory (Ant Lat Mall)  33C  12 cm NR  <4.6  >3  Right Sup Peroneal Anti Sensory (Ant Lat Mall)  33C  12 cm NR  <4.6  >3  Left Sural Anti Sensory (Lat Mall)  33C  Calf NR  <4.6  >3  Right Sural Anti Sensory (Lat Mall)  33C  Calf NR  <4.6  >3   Motor  Summary Table   Stim Site NR Onset (ms) Norm Onset (ms) O-P Amp (mV) Norm O-P Amp Site1 Site2 Delta-0 (ms) Dist (cm) Vel (m/s) Norm Vel (m/s)  Left Peroneal Motor (Ext Dig Brev)  33C  Ankle    4.0 <6.0 1.7 >2.5 B Fib Ankle 8.7 33.0 38 >40  B Fib    12.7  1.6  Poplt B Fib 2.8 10.0 36 >40  Poplt    15.5  1.6         Right Peroneal Motor (Ext Dig Brev)  33C  Ankle    3.8 <6.0 0.4 >2.5 B Fib Ankle 12.1 35.0 29 >40  B Fib    15.9  0.3  Poplt B Fib 3.6 10.0 28 >40  Poplt    19.5  0.4         Left Peroneal TA Motor (Tib Ant)  33C  Fib Head    3.5 <4.5 3.6 >3 Poplit Fib Head 2.0 10.0 50 >40  Poplit    5.5  3.6         Right Peroneal TA Motor (Tib Ant)  33C  Fib Head    2.9 <4.5 3.9 >3 Poplit Fib Head 2.4 10.0 42 >40  Poplit    5.3  3.7         Left Tibial Motor (Abd Hall Brev)  33C  Ankle    4.2 <6.0 4.3 >4 Knee Ankle 11.5 42.0 37 >40  Knee    15.7  3.0         Right Tibial Motor (Abd Hall Brev)  33C  Ankle    4.2 <6.0 3.7 >4 Knee Ankle 10.9 44.0 40 >40  Knee    15.1  2.5          H Reflex Studies   NR H-Lat (ms) Lat Norm (ms) L-R H-Lat (ms)  Left Tibial (Gastroc)  33C  NR  <35   Right Tibial (Gastroc)  33C  NR  <35    EMG   Side Muscle Ins Act Fibs Psw Fasc Number Recrt Dur Dur. Amp Amp. Poly Poly. Comment  Right GluteusMax Nml Nml Nml Nml Nml Nml Nml Nml Nml Nml Nml Nml N/A  Right Ext Dig Brev Nml Nml Nml Nml Nml Nml Nml Nml Nml Nml Nml Nml N/A  Right AbdHallucis Nml Nml Nml Nml Nml Nml Nml Nml Nml Nml Nml Nml N/A  Right AntTibialis Nml Nml Nml Nml 1- Rapid Few 1+ Few 1+ Nml Nml N/A  Right Flex Dig Long Nml Nml Nml Nml 1- Rapid Some 1+ Some 1+ Some 1+ N/A  Right Gastroc Nml Nml Nml Nml 1- Rapid Few 1+ Few 1+ Nml Nml N/A  Right RectFemoris Nml Nml Nml Nml Nml Nml Nml Nml Nml Nml Nml Nml N/A  Right GluteusMed Nml Nml Nml Nml Nml Nml Nml Nml Nml Nml Nml Nml N/A  Left AntTibialis Nml Nml Nml Nml 1- Rapid Some 1+ Some 1+ Some 1+ N/A  Left Gastroc Nml Nml Nml Nml 2- Rapid Some  1+ Some 1+ Some 1+ N/A  Left Flex Dig Long Nml Nml Nml Nml 2- Rapid Some 1+ Some 1+ Nml Nml N/A  Left RectFemoris Nml Nml Nml Nml Nml Nml Nml Nml Nml Nml Nml Nml N/A  Left GluteusMed Nml Nml Nml Nml Nml Nml Nml Nml Nml Nml Nml Nml N/A  Left BicepsFemS Nml Nml Nml Nml 1- Rapid Some 1+ Some 1+ Nml Nml N/A   Waveforms:

## 2016-04-29 ENCOUNTER — Telehealth: Payer: Self-pay

## 2016-04-29 DIAGNOSIS — G609 Hereditary and idiopathic neuropathy, unspecified: Secondary | ICD-10-CM

## 2016-04-29 NOTE — Telephone Encounter (Signed)
-----   Message from Pieter Partridge, DO sent at 04/29/2016  9:23 AM EDT ----- Nerve study does show a nonspecific peripheral neuropathy.  I would like to check some other labs looking for cause of neuropathy:  IFE/SPEP/UPEP, B6, ANA, Sed Rate, CRP

## 2016-04-29 NOTE — Telephone Encounter (Signed)
Sent via mychart

## 2016-04-29 NOTE — Telephone Encounter (Signed)
Left message on machine for pt to return call to the office. Order's have been placed.

## 2016-04-30 ENCOUNTER — Other Ambulatory Visit (INDEPENDENT_AMBULATORY_CARE_PROVIDER_SITE_OTHER): Payer: Medicare Other

## 2016-04-30 DIAGNOSIS — G609 Hereditary and idiopathic neuropathy, unspecified: Secondary | ICD-10-CM | POA: Diagnosis not present

## 2016-04-30 LAB — SEDIMENTATION RATE: Sed Rate: 3 mm/hr (ref 0–20)

## 2016-05-01 LAB — ANA: Anti Nuclear Antibody(ANA): NEGATIVE

## 2016-05-04 ENCOUNTER — Other Ambulatory Visit: Payer: Self-pay | Admitting: Family Medicine

## 2016-05-04 DIAGNOSIS — G609 Hereditary and idiopathic neuropathy, unspecified: Secondary | ICD-10-CM

## 2016-05-04 LAB — IMMUNOFIXATION ELECTROPHORESIS
IGM, SERUM: 73 mg/dL (ref 48–271)
IgA: 210 mg/dL (ref 81–463)
IgG (Immunoglobin G), Serum: 1015 mg/dL (ref 694–1618)

## 2016-05-04 LAB — PROTEIN ELECTROPHORESIS,RANDOM URN
Creatinine, Urine: 128 mg/dL (ref 20–370)
PROTEIN CREATININE RATIO: 78 mg/g{creat} (ref 22–128)
Total Protein, Urine: 10 mg/dL (ref 5–25)

## 2016-05-04 LAB — VITAMIN B6: VITAMIN B6: 43.8 ng/mL — AB (ref 2.1–21.7)

## 2016-05-04 LAB — PROTEIN ELECTROPHORESIS, SERUM
ALPHA-2-GLOBULIN: 0.7 g/dL (ref 0.5–0.9)
Albumin ELP: 4.6 g/dL (ref 3.8–4.8)
Alpha-1-Globulin: 0.3 g/dL (ref 0.2–0.3)
BETA GLOBULIN: 0.5 g/dL (ref 0.4–0.6)
Beta 2: 0.3 g/dL (ref 0.2–0.5)
Gamma Globulin: 0.9 g/dL (ref 0.8–1.7)
Total Protein, Serum Electrophoresis: 7.4 g/dL (ref 6.1–8.1)

## 2016-05-06 ENCOUNTER — Telehealth: Payer: Self-pay

## 2016-05-06 NOTE — Telephone Encounter (Signed)
-----   Message from Pieter Partridge, DO sent at 05/06/2016  7:16 AM EDT ----- Blood work looking for causes of neuropathy are unremarkable.  His B6 level is elevated, but not significant enough to cause neuropathy.

## 2016-05-06 NOTE — Telephone Encounter (Signed)
Released via mychart

## 2016-05-15 ENCOUNTER — Encounter: Payer: Self-pay | Admitting: Neurology

## 2016-05-15 ENCOUNTER — Ambulatory Visit (INDEPENDENT_AMBULATORY_CARE_PROVIDER_SITE_OTHER): Payer: Medicare Other | Admitting: Neurology

## 2016-05-15 VITALS — BP 130/70 | HR 53 | Ht 74.0 in | Wt 208.0 lb

## 2016-05-15 DIAGNOSIS — I63231 Cerebral infarction due to unspecified occlusion or stenosis of right carotid arteries: Secondary | ICD-10-CM | POA: Diagnosis not present

## 2016-05-15 DIAGNOSIS — G609 Hereditary and idiopathic neuropathy, unspecified: Secondary | ICD-10-CM | POA: Diagnosis not present

## 2016-05-15 DIAGNOSIS — E782 Mixed hyperlipidemia: Secondary | ICD-10-CM

## 2016-05-15 DIAGNOSIS — Z9889 Other specified postprocedural states: Secondary | ICD-10-CM | POA: Diagnosis not present

## 2016-05-15 DIAGNOSIS — I10 Essential (primary) hypertension: Secondary | ICD-10-CM | POA: Diagnosis not present

## 2016-05-15 NOTE — Patient Instructions (Signed)
1.  Continue aspirin and Plavix for secondary stroke prevention 2.  Continue Crestor 3.  Blood pressure control 4.  Mediterranean diet    Why follow it? Research shows. . Those who follow the Mediterranean diet have a reduced risk of heart disease  . The diet is associated with a reduced incidence of Parkinson's and Alzheimer's diseases . People following the diet may have longer life expectancies and lower rates of chronic diseases  . The Dietary Guidelines for Americans recommends the Mediterranean diet as an eating plan to promote health and prevent disease  What Is the Mediterranean Diet?  . Healthy eating plan based on typical foods and recipes of Mediterranean-style cooking . The diet is primarily a plant based diet; these foods should make up a majority of meals   Starches - Plant based foods should make up a majority of meals - They are an important sources of vitamins, minerals, energy, antioxidants, and fiber - Choose whole grains, foods high in fiber and minimally processed items  - Typical grain sources include wheat, oats, barley, corn, brown rice, bulgar, farro, millet, polenta, couscous  - Various types of beans include chickpeas, lentils, fava beans, black beans, white beans   Fruits  Veggies - Large quantities of antioxidant rich fruits & veggies; 6 or more servings  - Vegetables can be eaten raw or lightly drizzled with oil and cooked  - Vegetables common to the traditional Mediterranean Diet include: artichokes, arugula, beets, broccoli, brussel sprouts, cabbage, carrots, celery, collard greens, cucumbers, eggplant, kale, leeks, lemons, lettuce, mushrooms, okra, onions, peas, peppers, potatoes, pumpkin, radishes, rutabaga, shallots, spinach, sweet potatoes, turnips, zucchini - Fruits common to the Mediterranean Diet include: apples, apricots, avocados, cherries, clementines, dates, figs, grapefruits, grapes, melons, nectarines, oranges, peaches, pears, pomegranates,  strawberries, tangerines  Fats - Replace butter and margarine with healthy oils, such as olive oil, canola oil, and tahini  - Limit nuts to no more than a handful a day  - Nuts include walnuts, almonds, pecans, pistachios, pine nuts  - Limit or avoid candied, honey roasted or heavily salted nuts - Olives are central to the [employer] - can be eaten whole or used in a variety of dishes   Meats Protein - Limiting red meat: no more than a few times a month - When eating red meat: choose lean cuts and keep the portion to the size of deck of cards - Eggs: approx. 0 to 4 times a week  - Fish and lean poultry: at least 2 a week  - Healthy protein sources include, chicken, [place], lean beef, lamb - Increase intake of seafood such as tuna, salmon, trout, mackerel, shrimp, scallops - Avoid or limit high fat processed meats such as sausage and bacon  Dairy - Include moderate amounts of low fat dairy products  - Focus on healthy dairy such as fat free yogurt, skim milk, low or reduced fat cheese - Limit dairy products higher in fat such as whole or 2% milk, cheese, ice cream  Alcohol - Moderate amounts of red wine is ok  - No more than 5 oz daily for women (all ages) and men older than age 60  - No more than 10 oz of wine daily for men younger than 39  Other - Limit sweets and other desserts  - Use herbs and spices instead of salt to flavor foods  - Herbs and spices common to the traditional Mediterranean Diet include: basil, bay leaves, chives, cloves, cumin, fennel, garlic, lavender, marjoram,  mint, oregano, parsley, pepper, rosemary, sage, savory, sumac, tarragon, thyme   It's not just a diet, it's a lifestyle:  . The Mediterranean diet includes lifestyle factors typical of those in the region  . Foods, drinks and meals are best eaten with others and savored . Daily physical activity is important for overall good health . This could be strenuous exercise like running and aerobics . This  could also be more leisurely activities such as walking, housework, yard-work, or taking the stairs . Moderation is the key; a balanced and healthy diet accommodates most foods and drinks . Consider portion sizes and frequency of consumption of certain foods   Meal Ideas & Options:  . Breakfast:  o Whole wheat toast or whole wheat English muffins with peanut butter & hard boiled egg o Steel cut oats topped with apples & cinnamon and skim milk  o Fresh fruit: banana, strawberries, melon, berries, peaches  o Smoothies: strawberries, bananas, greek yogurt, peanut butter o Low fat greek yogurt with blueberries and granola  o Egg white omelet with spinach and mushrooms o Breakfast couscous: whole wheat couscous, apricots, skim milk, cranberries  . Sandwiches:  o Hummus and grilled vegetables (peppers, zucchini, squash) on whole wheat bread   o Grilled chicken on whole wheat pita with lettuce, tomatoes, cucumbers or tzatziki  o Tuna salad on whole wheat bread: tuna salad made with greek yogurt, olives, red peppers, capers, green onions o Garlic rosemary lamb pita: lamb sauted with garlic, rosemary, salt & pepper; add lettuce, cucumber, greek yogurt to pita - flavor with lemon juice and black pepper  . Seafood:  o Mediterranean grilled salmon, seasoned with garlic, basil, parsley, lemon juice and black pepper o Shrimp, lemon, and spinach whole-grain pasta salad made with low fat greek yogurt  o Seared scallops with lemon orzo  o Seared tuna steaks seasoned salt, pepper, coriander topped with tomato mixture of olives, tomatoes, olive oil, minced garlic, parsley, green onions and cappers  . Meats:  o Herbed greek chicken salad with kalamata olives, cucumber, feta  o Red bell peppers stuffed with spinach, bulgur, lean ground beef (or lentils) & topped with feta   o Kebabs: skewers of chicken, tomatoes, onions, zucchini, squash  o [place] burgers: made with red onions, mint, dill, lemon juice, feta  cheese topped with roasted red peppers . Vegetarian o Cucumber salad: cucumbers, artichoke hearts, celery, red onion, feta cheese, tossed in olive oil & lemon juice  o Hummus and whole grain pita points with a greek salad (lettuce, tomato, feta, olives, cucumbers, red onion) o Lentil soup with celery, carrots made with vegetable broth, garlic, salt and pepper  o Tabouli salad: parsley, bulgur, mint, scallions, cucumbers, tomato, radishes, lemon juice, olive oil, salt and pepper. 5.  Follow up in one year following repeat carotid doppler (or contact me/follow up sooner if needed).

## 2016-05-15 NOTE — Progress Notes (Signed)
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   UPDATE:  NCV-EMG from 04/28/16 revealed chronic symmetric sensorimotor polyneuropathy.  Neuropathy labs include:  ANA negative, Sed Rate 3, B6 43.8, SPEP/IFE/UPEP negative  HISTORY: Stroke:  In August 2011, he had a stroke, presenting as left visual field cut.  He had a right hemispheric stroke thought to be secondary to right ICA occlusion.  MRI of brain revealed an acute watershed infarct in the right PCA/MCA distributions.  CTA of the neck confirmed right common carotid artery occlusion at the origin.  The left ICA revealed approximately 50% stenosis just distal to the bulb.  CTA of the head revealed occluded right ICA with partial retrograde constitution from the left INCA via the PCA.  There was opacification of the right MCA and right ACA from the left ICA via the Acomm.  There was occlusion of the right PCA at its origin.  Hgb A1c at that time was 5.6 and LDL was 210.   He was placed on Plavix.  Follow up carotid dopplers revealed increased stenosis of the left ICA.  Carotid doppler from 01/03/13 revealed more than 70% stenosis involving the left bulb and left proximal ICA and 50-69% stenosis involving the left mid ICA, but without plaque.  Stenosis of left ECA and left VA noted.  CTA of the neck performed on 01/19/13 revealed 75-80% stenosis of the left ICA.  He underwent a left CEA on 02/13/13.  He continues to follow up with [name] from vascular surgery.   04/02/15 Carotid doppler:  Right ICA occluded.  Left ICA patent.  No plaque.09/28/13 Hgb A1c 5.7 02/07/13 2D Echo:  LVEF 55-65%, no regional wall motion abnormalities,  mild MVR, mildly dilated left atrium, mildly dilated right ventricle, mildly increased systolic pressure in pulmonary arteries (PA peak pressure 25mm Hg).  He followed up with vascular surgery on 04/07/16.   Carotid doppler performed then again revealed known right ICA occlusion with patent left carotid endarterectomy site, not significantly changed compared to last exam.  Neuropathy:  He reports numbness on the bottom of his feet, mostly his toes.  He also has a discomfort involving the left shin, but it is not a pain.  He says she sometimes catches his toe of his left foot on the ground and may stumble.  This has been ongoing since his stroke.  He also reports having fractured his left ankle twice.  He does report crossing his legs.  He notes muscle cramps in the legs, which bother him at night.  PAST MEDICAL HISTORY: Past Medical History:  Diagnosis Date  . Allergy    seasonal- mostly spring  . Anemia 02/03/2011  . ASTHMA, CHILDHOOD 03/17/2010   mostly as child  . BCC (basal cell carcinoma) 12/08/2011  . CAROTID ARTERY OCCLUSION, WITH INFARCTION 03/17/2010  . CEREBROVASCULAR ACCIDENT 03/17/2010   partial loss of peripheral vision on left-"slight improved"  . CHICKENPOX, HX OF 03/17/2010  . Constipation 12/08/2014  . Epistaxis 09/06/2013  . FATIGUE 03/17/2010  . Hearing loss 11/04/2010   bilateral  . HYPERKALEMIA 05/27/2010  . Hypertension   . Hypothyroid 12/08/2011  . Mixed hyperlipidemia 03/17/2010  . Oral lesion 11/20/2014  . Overweight(278.02) 07/01/2010  .  Personal history of other infectious and parasitic disease 03/17/2010  . Skin lesion of right arm 11/20/2014  . Sleep apnea    uses mouthpiece only  . Thrombocytopenia ([place]) 04/05/2012  . TOBACCO ABUSE, HX OF 03/17/2010  . Tubular adenoma of colon 06/10/2011    MEDICATIONS: Current Outpatient Prescriptions on File Prior to Visit  Medication Sig Dispense Refill  . aspirin EC 81 MG tablet Take 81 mg by mouth daily.    [name] b complex vitamins  tablet Take 1 tablet by mouth daily.    . chlorthalidone (HYGROTON) 25 MG tablet Take 1 tablet by mouth  every morning 90 tablet 1  . chlorthalidone (HYGROTON) 25 MG tablet TAKE 1 TABLET BY MOUTH  EVERY MORNING 90 tablet 2  . Cholecalciferol (VITAMIN D PO) Take 2,000 mg by mouth daily.     . clopidogrel (PLAVIX) 75 MG tablet TAKE 1 TABLET BY MOUTH  DAILY WITH BREAKFAST 90 tablet 1  . Coenzyme Q10 (CO Q-10) 300 MG CAPS Take 1 capsule by mouth daily.    . Glucosamine-Chondroit-Vit C-Mn (GLUCOSAMINE 1500 COMPLEX PO) Take 1,500 mg by mouth daily.     [name] levothyroxine (SYNTHROID, LEVOTHROID) 75 MCG tablet TAKE 1 TABLET BY MOUTH  DAILY 90 tablet 2  . magnesium oxide (MAG-OX) 400 MG tablet Take 400 mg by mouth daily.    [name] MEGARED OMEGA-3 KRILL OIL 500 MG CAPS Take 1,000 mg by mouth.    . pantoprazole (PROTONIX) 40 MG tablet Take 1 tablet by mouth  daily 90 tablet 3  . Probiotic Product (PROBIOTIC PO) Take 1 capsule by mouth daily.     . ramipril (ALTACE) 10 MG capsule TAKE 1 CAPSULE BY MOUTH TWO TIMES DAILY 180 capsule 2  . rosuvastatin (CRESTOR) 40 MG tablet TAKE 1 TABLET BY MOUTH  EVERY EVENING 90 tablet 2  . Turmeric (CURCUMIN 95) 500 MG CAPS Take 500 mg by mouth.     No current facility-administered medications on file prior to visit.     ALLERGIES: No Known Allergies  FAMILY HISTORY: Family History  Problem Relation Age of Onset  . Alzheimer's disease Mother   . Emphysema Sister     cigarettes  . Heart disease Son   . Coronary artery disease Brother   . Heart attack Brother   . Other Brother     heart problems- from fathers side  . Cancer Brother   . Heart disease Brother 80    Heart disease before age 52  . Cancer Sister     gyn  . Heart disease Sister   . Bipolar disorder Daughter   . Obesity Daughter   . Heart disease Sister   . Stroke Father     SOCIAL HISTORY: Social History   Social History  . Marital status: Divorced    Spouse name: N/A  . Number of children: 3    . Years of education: 39yrs   Occupational History  . retired Retired   Social History Main Topics  . Smoking status: Former Smoker    Packs/day: 2.00    Years: 20.00    Types: Cigarettes    Quit date: 07/19/1977  . Smokeless tobacco: Never Used  . Alcohol use 4.2 oz/week    7 Glasses of wine per week     Comment: dinner wine daily  . Drug use: No  . Sexual activity: No   Other Topics Concern  . Not on file   Social History Narrative   Pt lives at home  alone.    Caffeine Use: very little    REVIEW OF SYSTEMS: Constitutional: No fevers, chills, or sweats, no generalized fatigue, change in appetite Eyes: No visual changes, double vision, eye pain Ear, nose and throat: No hearing loss, ear pain, nasal congestion, sore throat Cardiovascular: No chest pain, palpitations Respiratory:  No shortness of breath at rest or with exertion, wheezes GastrointestinaI: No nausea, vomiting, diarrhea, abdominal pain, fecal incontinence Genitourinary:  No dysuria, urinary retention or frequency Musculoskeletal:  No neck pain, back pain Integumentary: No rash, pruritus, skin lesions Neurological: as above Psychiatric: No depression, insomnia, anxiety Endocrine: No palpitations, fatigue, diaphoresis, mood swings, change in appetite, change in weight, increased thirst Hematologic/Lymphatic:  No purpura, petechiae. Allergic/Immunologic: no itchy/runny eyes, nasal congestion, recent allergic reactions, rashes  PHYSICAL EXAM: Vitals:   05/15/16 1045  BP: 130/70  Pulse: (!) 53   General: No acute distress.  Patient appears well-groomed.  normal body habitus. Head:  Normocephalic/atraumatic  IMPRESSION: Idiopathic polyneuropathy Right MCA/PCA watershed infarct, secondary to right ICA occlusion and right PCA occlusion. Asymptomatic left ICA disease status post left CEA, stable Hyperlipidemia Hypertension  PLAN: 1.  At this time, he doesn't feel he needs any medication for treatment of  nerve pain or discomfort. 2.  ASA and Plavix for secondary stroke prevention (as managed by PCP) 3.  Statin therapy as managed by PCP (LDL goal less than 70) 4.  Continue blood pressure control 5.  Mediterranean diet 6.  B6 was elevated, but not significant enough that would likely contribute to neuropathy.  However, he may want to discontinue B6 supplement (which is included in his B-complex vitamin) to see if it makes any difference. 7.  Follow up in one year after repeat carotid doppler (or contact us/follow up sooner if needed)  26 minutes spent face to face with patient, 100% spent counseling.  Metta Clines, DO  CC:  Penni Homans, MD

## 2016-06-16 ENCOUNTER — Other Ambulatory Visit: Payer: Medicare Other

## 2016-06-23 ENCOUNTER — Encounter: Payer: Medicare Other | Admitting: Family Medicine

## 2016-07-28 ENCOUNTER — Telehealth: Payer: Self-pay | Admitting: Family Medicine

## 2016-07-28 NOTE — Telephone Encounter (Signed)
Spoke with patient regarding his CPE and AWV. Patient stated that he wants Dr. [name] to complete both the CPE and AWV. Informed patient that his appointment with Dr. [name] is scheduled on 08/18/2016.

## 2016-07-28 NOTE — Telephone Encounter (Signed)
Called patient to schedule awv. Left msg for patient to call office and schedule appt.  °

## 2016-08-11 ENCOUNTER — Other Ambulatory Visit (INDEPENDENT_AMBULATORY_CARE_PROVIDER_SITE_OTHER): Payer: Medicare Other

## 2016-08-11 DIAGNOSIS — R739 Hyperglycemia, unspecified: Secondary | ICD-10-CM

## 2016-08-11 DIAGNOSIS — E785 Hyperlipidemia, unspecified: Secondary | ICD-10-CM

## 2016-08-11 LAB — LIPID PANEL
CHOL/HDL RATIO: 3
Cholesterol: 166 mg/dL (ref 0–200)
HDL: 55.3 mg/dL (ref >39.00)
LDL CALC: 94 mg/dL (ref 0–99)
NONHDL: 110.82
Triglycerides: 85 mg/dL (ref 0.0–149.0)
VLDL: 17 mg/dL (ref 0.0–40.0)

## 2016-08-11 LAB — CBC WITH DIFFERENTIAL/PLATELET
BASOS ABS: 0 10*3/uL (ref 0.0–0.1)
Basophils Relative: 0.6 % (ref 0.0–3.0)
Eosinophils Absolute: 0.5 10*3/uL (ref 0.0–0.7)
Eosinophils Relative: 6.8 % — ABNORMAL HIGH (ref 0.0–5.0)
HCT: 43.4 % (ref 39.0–52.0)
Hemoglobin: 14.6 g/dL (ref 13.0–17.0)
LYMPHS ABS: 2.3 10*3/uL (ref 0.7–4.0)
LYMPHS PCT: 33.4 % (ref 12.0–46.0)
MCHC: 33.5 g/dL (ref 30.0–36.0)
MCV: 94.6 fl (ref 78.0–100.0)
MONOS PCT: 6.7 % (ref 3.0–12.0)
Monocytes Absolute: 0.5 10*3/uL (ref 0.1–1.0)
NEUTROS PCT: 52.5 % (ref 43.0–77.0)
Neutro Abs: 3.7 10*3/uL (ref 1.4–7.7)
Platelets: 164 10*3/uL (ref 150.0–400.0)
RBC: 4.59 Mil/uL (ref 4.22–5.81)
RDW: 14.9 % (ref 11.5–15.5)
WBC: 7 10*3/uL (ref 4.0–10.5)

## 2016-08-11 LAB — COMPREHENSIVE METABOLIC PANEL
ALK PHOS: 57 U/L (ref 39–117)
ALT: 12 U/L (ref 0–53)
AST: 14 U/L (ref 0–37)
Albumin: 4.6 g/dL (ref 3.5–5.2)
BILIRUBIN TOTAL: 0.6 mg/dL (ref 0.2–1.2)
BUN: 19 mg/dL (ref 6–23)
CALCIUM: 9.5 mg/dL (ref 8.4–10.5)
CO2: 32 mEq/L (ref 19–32)
Chloride: 102 mEq/L (ref 96–112)
Creatinine, Ser: 1.12 mg/dL (ref 0.40–1.50)
GFR: 67.12 mL/min (ref >60.00)
GLUCOSE: 87 mg/dL (ref 70–99)
Potassium: 4 mEq/L (ref 3.5–5.1)
Sodium: 139 mEq/L (ref 135–145)
TOTAL PROTEIN: 7 g/dL (ref 6.0–8.3)

## 2016-08-11 LAB — TSH: TSH: 3.12 u[IU]/mL (ref 0.35–4.50)

## 2016-08-12 ENCOUNTER — Telehealth: Payer: Self-pay | Admitting: Family Medicine

## 2016-08-12 NOTE — Telephone Encounter (Signed)
Patient Tanner [name] back to 08/18/16 at 8:30pm please disregard message below.

## 2016-08-12 NOTE — Telephone Encounter (Signed)
Relation to [id number] Call back number: Pharmacy:  Reason for call:  Patient 08/18/16 has been [facility] due to provider, patient states this is the 4x CPE had to be [facility] within 4 years and would like PCP to find time to see him sooner then May which is PCP next physical appointment. Scheduled patient for follow up for 08/31/16 at 7:45am.

## 2016-08-18 ENCOUNTER — Ambulatory Visit (INDEPENDENT_AMBULATORY_CARE_PROVIDER_SITE_OTHER): Payer: Medicare Other | Admitting: Family Medicine

## 2016-08-18 ENCOUNTER — Encounter: Payer: Self-pay | Admitting: Family Medicine

## 2016-08-18 ENCOUNTER — Encounter: Payer: Medicare Other | Admitting: Family Medicine

## 2016-08-18 VITALS — BP 150/72 | HR 65 | Temp 98.0°F | Wt 208.2 lb

## 2016-08-18 DIAGNOSIS — G629 Polyneuropathy, unspecified: Secondary | ICD-10-CM | POA: Diagnosis not present

## 2016-08-18 DIAGNOSIS — E782 Mixed hyperlipidemia: Secondary | ICD-10-CM | POA: Diagnosis not present

## 2016-08-18 DIAGNOSIS — I6350 Cerebral infarction due to unspecified occlusion or stenosis of unspecified cerebral artery: Secondary | ICD-10-CM

## 2016-08-18 DIAGNOSIS — Z8673 Personal history of transient ischemic attack (TIA), and cerebral infarction without residual deficits: Secondary | ICD-10-CM

## 2016-08-18 DIAGNOSIS — E039 Hypothyroidism, unspecified: Secondary | ICD-10-CM

## 2016-08-18 DIAGNOSIS — E531 Pyridoxine deficiency: Secondary | ICD-10-CM

## 2016-08-18 DIAGNOSIS — Z0000 Encounter for general adult medical examination without abnormal findings: Secondary | ICD-10-CM

## 2016-08-18 DIAGNOSIS — I10 Essential (primary) hypertension: Secondary | ICD-10-CM

## 2016-08-18 HISTORY — DX: Polyneuropathy, unspecified: G62.9

## 2016-08-18 NOTE — Assessment & Plan Note (Signed)
Tolerating statin, encouraged heart healthy diet, avoid trans fats, minimize simple carbs and saturated fats. Increase exercise as tolerated 

## 2016-08-18 NOTE — Progress Notes (Signed)
Pre visit review using our clinic review tool, if applicable. No additional management support is needed unless otherwise documented below in the visit note. 

## 2016-08-18 NOTE — Patient Instructions (Addendum)
Ice for 10 minutes and then add Salon pas with Lidocaine, or patches.   Preventive Care 70 Years and Older, Male Preventive care refers to lifestyle choices and visits with your health care provider that can promote health and wellness. What does preventive care include?  A yearly physical exam. This is also called an annual well check.  Dental exams once or twice a year.  Routine eye exams. Ask your health care provider how often you should have your eyes checked.  Personal lifestyle choices, including:  Daily care of your teeth and gums.  Regular physical activity.  Eating a healthy diet.  Avoiding tobacco and drug use.  Limiting alcohol use.  Practicing safe sex.  Taking low doses of aspirin every day.  Taking vitamin and mineral supplements as recommended by your health care provider. What happens during an annual well check? The services and screenings done by your health care provider during your annual well check will depend on your age, overall health, lifestyle risk factors, and family history of disease. Counseling  Your health care provider may ask you questions about your:  Alcohol use.  Tobacco use.  Drug use.  Emotional well-being.  Home and relationship well-being.  Sexual activity.  Eating habits.  History of falls.  Memory and ability to understand (cognition).  Work and work Statistician. Screening  You may have the following tests or measurements:  Height, weight, and BMI.  Blood pressure.  Lipid and cholesterol levels. These may be checked every 5 years, or more frequently if you are over 9 years old.  Skin check.  Lung cancer screening. You may have this screening every year starting at age 1 if you have a 30-pack-year history of smoking and currently smoke or have quit within the past 15 years.  Fecal occult blood test (FOBT) of the stool. You may have this test every year starting at age 49.  Flexible sigmoidoscopy or  colonoscopy. You may have a sigmoidoscopy every 5 years or a colonoscopy every 10 years starting at age 63.  Prostate cancer screening. Recommendations will vary depending on your family history and other risks.  Hepatitis C blood test.  Hepatitis B blood test.  Sexually transmitted disease (STD) testing.  Diabetes screening. This is done by checking your blood sugar (glucose) after you have not eaten for a while (fasting). You may have this done every 1-3 years.  Abdominal aortic aneurysm (AAA) screening. You may need this if you are a current or former smoker.  Osteoporosis. You may be screened starting at age 54 if you are at high risk. Talk with your health care provider about your test results, treatment options, and if necessary, the need for more tests. Vaccines  Your health care provider may recommend certain vaccines, such as:  Influenza vaccine. This is recommended every year.  Tetanus, diphtheria, and acellular pertussis (Tdap, Td) vaccine. You may need a Td booster every 10 years.  Varicella vaccine. You may need this if you have not been vaccinated.  Zoster vaccine. You may need this after age 58.  Measles, mumps, and rubella (MMR) vaccine. You may need at least one dose of MMR if you were born in 1957 or later. You may also need a second dose.  Pneumococcal 13-valent conjugate (PCV13) vaccine. One dose is recommended after age 80.  Pneumococcal polysaccharide (PPSV23) vaccine. One dose is recommended after age 62.  Meningococcal vaccine. You may need this if you have certain conditions.  Hepatitis A vaccine. You may need  this if you have certain conditions or if you travel or work in places where you may be exposed to hepatitis A.  Hepatitis B vaccine. You may need this if you have certain conditions or if you travel or work in places where you may be exposed to hepatitis B.  Haemophilus influenzae type b (Hib) vaccine. You may need this if you have certain risk  factors. Talk to your health care provider about which screenings and vaccines you need and how often you need them. This information is not intended to replace advice given to you by your health care provider. Make sure you discuss any questions you have with your health care provider. Document Released: 08/02/2015 Document Revised: 03/25/2016 Document Reviewed: 05/07/2015 Elsevier Interactive Patient Education  2017 [employer].

## 2016-08-18 NOTE — Assessment & Plan Note (Signed)
On Levothyroxine, continue to monitor 

## 2016-08-18 NOTE — Assessment & Plan Note (Signed)
no changes to meds. Encouraged heart healthy diet such as the DASH diet and exercise as tolerated.  

## 2016-08-18 NOTE — Progress Notes (Signed)
Subjective:    Patient ID: Tanner [name], male    DOB: [birth year], 82 y.o.   MRN: [id number]  Chief Complaint  Patient presents with  . Annual Exam   I acted as a Education administrator for Dr. [name]. [place], RMA  HPI Patient is in today for annual visit following up with hypertension, hyperlipidemia and other chronic concerns. He feels well today. No recent illness or acute concerns. No recent hospitalizations. Had right eye surgery went well. Denies CP/palp/SOB/HA/congestion/fevers/GI or GU c/o. Taking meds as prescribed  Past Medical History:  Diagnosis Date  . Allergy    seasonal- mostly spring  . Anemia 02/03/2011  . ASTHMA, CHILDHOOD 03/17/2010   mostly as child  . BCC (basal cell carcinoma) 12/08/2011  . CAROTID ARTERY OCCLUSION, WITH INFARCTION 03/17/2010  . CEREBROVASCULAR ACCIDENT 03/17/2010   partial loss of peripheral vision on left-"slight improved"  . CHICKENPOX, HX OF 03/17/2010  . Constipation 12/08/2014  . Epistaxis 09/06/2013  . FATIGUE 03/17/2010  . Hearing loss 11/04/2010   bilateral  . HYPERKALEMIA 05/27/2010  . Hypertension   . Hypothyroid 12/08/2011  . Mixed hyperlipidemia 03/17/2010  . Oral lesion 11/20/2014  . Overweight(278.02) 07/01/2010  . Peripheral neuropathy ([place]) 08/18/2016  . Personal history of other infectious and parasitic disease 03/17/2010  . Preventative health care 08/23/2016  . Skin lesion of right arm 11/20/2014  . Sleep apnea    uses mouthpiece only  . Thrombocytopenia ([place]) 04/05/2012  . TOBACCO ABUSE, HX OF 03/17/2010  . Tubular adenoma of colon 06/10/2011    Past Surgical History:  Procedure Laterality Date  . CAROTID ENDARTERECTOMY Left February 13, 2013   cea  . COLONOSCOPY  Dec. 2014  . ENDARTERECTOMY Left 02/13/2013   Procedure: ENDARTERECTOMY CAROTID;  Surgeon: Rosetta Posner, MD;  Location: [facility];  Service: Vascular;  Laterality: Left;  . EUS N/A 08/17/2013   Procedure: UPPER ENDOSCOPIC ULTRASOUND (EUS) LINEAR;  Surgeon: Milus Banister, MD;   Location: [facility];  Service: Endoscopy;  Laterality: N/A;  . EYE SURGERY Right    Cataract, and stigmatism  . knee cartliage repair  1964, 1990   bilateral  . [place]   removed  . ROTATOR CUFF REPAIR     right  . TONSILLECTOMY    . UPPER GI ENDOSCOPY  Jan. 2015    Family History  Problem Relation Age of Onset  . Alzheimer's disease Mother   . Emphysema Sister     cigarettes  . Cancer Sister     lung, tobacco   . Heart disease Son   . Coronary artery disease Brother   . Heart attack Brother   . Other Brother     heart problems- from fathers side  . Cancer Brother 51    lung? smoker  . Heart disease Brother 46    Heart disease before age 46  . Cancer Sister     gyn  . Heart disease Sister   . Bipolar disorder Daughter   . Obesity Daughter   . Stroke Father   . Heart disease Sister     Social History   Social History  . Marital status: Divorced    Spouse name: N/A  . Number of children: 3  . Years of education: 33yrs   Occupational History  . retired Retired   Social History Main Topics  . Smoking status: Former Smoker    Packs/day: 2.00    Years: 20.00    Types: Cigarettes  Quit date: 07/19/1977  . Smokeless tobacco: Never Used  . Alcohol use 4.2 oz/week    7 Glasses of wine per week     Comment: dinner wine daily  . Drug use: No  . Sexual activity: No   Other Topics Concern  . Not on file   Social History Narrative   Pt lives at home alone.    Caffeine Use: very little   Heart healthy diet, is planning exercise      Retired from Event organiser    Outpatient Medications Prior to Visit  Medication Sig Dispense Refill  . aspirin EC 81 MG tablet Take 81 mg by mouth daily.    [name] b complex vitamins tablet Take 1 tablet by mouth daily.    . chlorthalidone (HYGROTON) 25 MG tablet TAKE 1 TABLET BY MOUTH  EVERY MORNING 90 tablet 2  . Cholecalciferol (VITAMIN D PO) Take 2,000 mg by mouth daily.     . clopidogrel (PLAVIX)  75 MG tablet TAKE 1 TABLET BY MOUTH  DAILY WITH BREAKFAST 90 tablet 1  . Coenzyme Q10 (CO Q-10) 300 MG CAPS Take 1 capsule by mouth daily.    . Glucosamine-Chondroit-Vit C-Mn (GLUCOSAMINE 1500 COMPLEX PO) Take 1,500 mg by mouth daily.     [name] levothyroxine (SYNTHROID, LEVOTHROID) 75 MCG tablet TAKE 1 TABLET BY MOUTH  DAILY 90 tablet 2  . magnesium oxide (MAG-OX) 400 MG tablet Take 400 mg by mouth daily.    [name] MEGARED OMEGA-3 KRILL OIL 500 MG CAPS Take 1,000 mg by mouth.    . pantoprazole (PROTONIX) 40 MG tablet Take 1 tablet by mouth  daily 90 tablet 3  . Probiotic Product (PROBIOTIC PO) Take 1 capsule by mouth daily.     . ramipril (ALTACE) 10 MG capsule TAKE 1 CAPSULE BY MOUTH TWO TIMES DAILY 180 capsule 2  . rosuvastatin (CRESTOR) 40 MG tablet TAKE 1 TABLET BY MOUTH  EVERY EVENING 90 tablet 2  . Turmeric (CURCUMIN 95) 500 MG CAPS Take 500 mg by mouth.    . chlorthalidone (HYGROTON) 25 MG tablet Take 1 tablet by mouth  every morning 90 tablet 1   No facility-administered medications prior to visit.     No Known Allergies  Review of Systems  Constitutional: Negative for fever and malaise/fatigue.  HENT: Negative for congestion.   Eyes: Negative for blurred vision.  Respiratory: Negative for cough and shortness of breath.   Cardiovascular: Negative for chest pain, palpitations and leg swelling.  Gastrointestinal: Negative for vomiting.  Genitourinary: Negative for frequency.  Musculoskeletal: Negative for back pain.  Skin: Negative for rash.  Neurological: Negative for loss of consciousness and headaches.       Objective:    Physical Exam  Constitutional: He is oriented to person, place, and time. He appears well-developed and well-nourished. No distress.  HENT:  Head: Normocephalic and atraumatic.  Eyes: Conjunctivae are normal.  Neck: Normal range of motion. No thyromegaly present.  Cardiovascular: Normal rate and regular rhythm.   Pulmonary/Chest: Effort normal and breath  sounds normal. He has no wheezes.  Abdominal: Soft. Bowel sounds are normal. There is no tenderness.  Musculoskeletal: Normal range of motion. He exhibits no edema or deformity.  Neurological: He is alert and oriented to person, place, and time.  Skin: Skin is warm and dry. He is not diaphoretic.  Psychiatric: He has a normal mood and affect.    BP (!) 150/72 (BP Location: Left Arm, Patient Position: Sitting, Cuff Size: Normal)  Pulse 65   Temp 98 F (36.7 C) (Oral)   Wt 208 lb 3.2 oz (94.4 kg)   SpO2 99%   BMI 26.73 kg/m  Wt Readings from Last 3 Encounters:  08/18/16 208 lb 3.2 oz (94.4 kg)  05/15/16 208 lb (94.3 kg)  04/09/16 203 lb (92.1 kg)     Lab Results  Component Value Date   WBC 7.0 08/11/2016   HGB 14.6 08/11/2016   HCT 43.4 08/11/2016   PLT 164.0 08/11/2016   GLUCOSE 87 08/11/2016   CHOL 166 08/11/2016   TRIG 85.0 08/11/2016   HDL 55.30 08/11/2016   LDLCALC 94 08/11/2016   ALT 12 08/11/2016   AST 14 08/11/2016   NA 139 08/11/2016   K 4.0 08/11/2016   CL 102 08/11/2016   CREATININE 1.12 08/11/2016   BUN 19 08/11/2016   CO2 32 08/11/2016   TSH 3.12 08/11/2016   PSA 0.39 05/08/2014   INR 0.99 02/07/2013   HGBA1C 5.8 12/03/2015   MICROALBUR 2.0 (H) 12/03/2015    Lab Results  Component Value Date   TSH 3.12 08/11/2016   Lab Results  Component Value Date   WBC 7.0 08/11/2016   HGB 14.6 08/11/2016   HCT 43.4 08/11/2016   MCV 94.6 08/11/2016   PLT 164.0 08/11/2016   Lab Results  Component Value Date   NA 139 08/11/2016   K 4.0 08/11/2016   CO2 32 08/11/2016   GLUCOSE 87 08/11/2016   BUN 19 08/11/2016   CREATININE 1.12 08/11/2016   BILITOT 0.6 08/11/2016   ALKPHOS 57 08/11/2016   AST 14 08/11/2016   ALT 12 08/11/2016   PROT 7.0 08/11/2016   ALBUMIN 4.6 08/11/2016   CALCIUM 9.5 08/11/2016   GFR 67.12 08/11/2016   Lab Results  Component Value Date   CHOL 166 08/11/2016   Lab Results  Component Value Date   HDL 55.30 08/11/2016    Lab Results  Component Value Date   LDLCALC 94 08/11/2016   Lab Results  Component Value Date   TRIG 85.0 08/11/2016   Lab Results  Component Value Date   CHOLHDL 3 08/11/2016   Lab Results  Component Value Date   HGBA1C 5.8 12/03/2015       Assessment & Plan:   Problem List Items Addressed This Visit    Hypothyroid (Chronic)    On Levothyroxine, continue to monitor      Relevant Orders   TSH   TSH   Mixed hyperlipidemia    Tolerating statin, encouraged heart healthy diet, avoid trans fats, minimize simple carbs and saturated fats. Increase exercise as tolerated      Relevant Orders   Lipid panel   Lipid panel   Essential hypertension, benign    no changes to meds. Encouraged heart healthy diet such as the DASH diet and exercise as tolerated.       Relevant Orders   Comprehensive metabolic panel   TSH   CBC with Differential/Platelet   CBC with Differential/Platelet   Comprehensive metabolic panel   TSH   Cerebral artery occlusion with cerebral infarction [facility])    Following with neurology. Not in a study any further. No recent episodes or concerns.       Peripheral neuropathy [facility])   Preventative health care    Patient encouraged to maintain heart healthy diet, regular exercise, adequate sleep. Consider daily probiotics. Take medications as prescribed. Given and reviewed copy of ACP documents from [employer] and encouraged to complete  and return       Other Visit Diagnoses    Vitamin B6 deficiency    -  Primary   Relevant Orders   Vitamin B6      I am having Mr. Devisser maintain his Co Q-10, Cholecalciferol (VITAMIN D PO), b complex vitamins, Probiotic Product (PROBIOTIC PO), aspirin EC, Glucosamine-Chondroit-Vit C-Mn (GLUCOSAMINE 1500 COMPLEX PO), magnesium oxide, pantoprazole, Turmeric, MEGARED OMEGA-3 KRILL OIL, ramipril, chlorthalidone, levothyroxine, clopidogrel, and rosuvastatin.  No orders of the defined types were placed in this  encounter.   CMA served as Education administrator during this visit. History, Physical and Plan performed by medical provider. Documentation and orders reviewed and attested to.  Penni Homans, MD

## 2016-08-23 ENCOUNTER — Encounter: Payer: Self-pay | Admitting: Family Medicine

## 2016-08-23 DIAGNOSIS — Z0000 Encounter for general adult medical examination without abnormal findings: Secondary | ICD-10-CM

## 2016-08-23 HISTORY — DX: Encounter for general adult medical examination without abnormal findings: Z00.00

## 2016-08-23 NOTE — Assessment & Plan Note (Signed)
Following with neurology. Not in a study any further. No recent episodes or concerns.

## 2016-08-23 NOTE — Assessment & Plan Note (Signed)
Patient encouraged to maintain heart healthy diet, regular exercise, adequate sleep. Consider daily probiotics. Take medications as prescribed. Given and reviewed copy of ACP documents from [place] Secretary of State and encouraged to complete and return 

## 2016-08-31 ENCOUNTER — Ambulatory Visit: Payer: Medicare Other | Admitting: Family Medicine

## 2016-09-10 ENCOUNTER — Telehealth: Payer: Self-pay | Admitting: *Deleted

## 2016-09-10 NOTE — Telephone Encounter (Signed)
Pt declines

## 2016-10-10 ENCOUNTER — Other Ambulatory Visit: Payer: Self-pay | Admitting: Family Medicine

## 2016-10-26 ENCOUNTER — Telehealth: Payer: Self-pay | Admitting: Family Medicine

## 2016-10-26 NOTE — Telephone Encounter (Signed)
Pt declines AWV.

## 2017-01-08 ENCOUNTER — Other Ambulatory Visit: Payer: Self-pay | Admitting: Family Medicine

## 2017-02-09 ENCOUNTER — Other Ambulatory Visit (INDEPENDENT_AMBULATORY_CARE_PROVIDER_SITE_OTHER): Payer: Medicare Other

## 2017-02-09 DIAGNOSIS — E782 Mixed hyperlipidemia: Secondary | ICD-10-CM | POA: Diagnosis not present

## 2017-02-09 DIAGNOSIS — E039 Hypothyroidism, unspecified: Secondary | ICD-10-CM

## 2017-02-09 DIAGNOSIS — I10 Essential (primary) hypertension: Secondary | ICD-10-CM

## 2017-02-09 DIAGNOSIS — E531 Pyridoxine deficiency: Secondary | ICD-10-CM

## 2017-02-09 LAB — LIPID PANEL
CHOLESTEROL: 151 mg/dL (ref 0–200)
HDL: 47.7 mg/dL (ref >39.00)
LDL CALC: 79 mg/dL (ref 0–99)
NonHDL: 103.29
Total CHOL/HDL Ratio: 3
Triglycerides: 119 mg/dL (ref 0.0–149.0)
VLDL: 23.8 mg/dL (ref 0.0–40.0)

## 2017-02-09 LAB — COMPREHENSIVE METABOLIC PANEL
ALBUMIN: 4.5 g/dL (ref 3.5–5.2)
ALT: 14 U/L (ref 0–53)
AST: 16 U/L (ref 0–37)
Alkaline Phosphatase: 56 U/L (ref 39–117)
BILIRUBIN TOTAL: 1 mg/dL (ref 0.2–1.2)
BUN: 18 mg/dL (ref 6–23)
CALCIUM: 10.1 mg/dL (ref 8.4–10.5)
CO2: 31 meq/L (ref 19–32)
CREATININE: 1.12 mg/dL (ref 0.40–1.50)
Chloride: 104 mEq/L (ref 96–112)
GFR: 67.04 mL/min (ref >60.00)
Glucose, Bld: 100 mg/dL — ABNORMAL HIGH (ref 70–99)
Potassium: 4.4 mEq/L (ref 3.5–5.1)
Sodium: 140 mEq/L (ref 135–145)
TOTAL PROTEIN: 6.8 g/dL (ref 6.0–8.3)

## 2017-02-09 LAB — CBC WITH DIFFERENTIAL/PLATELET
BASOS ABS: 0.1 10*3/uL (ref 0.0–0.1)
Basophils Relative: 1.4 % (ref 0.0–3.0)
EOS ABS: 0.4 10*3/uL (ref 0.0–0.7)
Eosinophils Relative: 6.8 % — ABNORMAL HIGH (ref 0.0–5.0)
HEMATOCRIT: 44.4 % (ref 39.0–52.0)
HEMOGLOBIN: 14.7 g/dL (ref 13.0–17.0)
LYMPHS PCT: 31.2 % (ref 12.0–46.0)
Lymphs Abs: 1.8 10*3/uL (ref 0.7–4.0)
MCHC: 33.2 g/dL (ref 30.0–36.0)
MCV: 96.1 fl (ref 78.0–100.0)
Monocytes Absolute: 0.5 10*3/uL (ref 0.1–1.0)
Monocytes Relative: 8.4 % (ref 3.0–12.0)
Neutro Abs: 3.1 10*3/uL (ref 1.4–7.7)
Neutrophils Relative %: 52.2 % (ref 43.0–77.0)
Platelets: 175 10*3/uL (ref 150.0–400.0)
RBC: 4.62 Mil/uL (ref 4.22–5.81)
RDW: 14.4 % (ref 11.5–15.5)
WBC: 5.9 10*3/uL (ref 4.0–10.5)

## 2017-02-09 LAB — TSH: TSH: 3.36 u[IU]/mL (ref 0.35–4.50)

## 2017-02-12 LAB — VITAMIN B6: Vitamin B6: 20.4 ng/mL (ref 2.1–21.7)

## 2017-02-16 ENCOUNTER — Encounter: Payer: Self-pay | Admitting: Family Medicine

## 2017-02-16 ENCOUNTER — Ambulatory Visit (INDEPENDENT_AMBULATORY_CARE_PROVIDER_SITE_OTHER): Payer: Medicare Other | Admitting: Family Medicine

## 2017-02-16 VITALS — BP 158/86 | HR 68 | Temp 98.2°F | Resp 18 | Wt 211.0 lb

## 2017-02-16 DIAGNOSIS — I10 Essential (primary) hypertension: Secondary | ICD-10-CM

## 2017-02-16 DIAGNOSIS — R159 Full incontinence of feces: Secondary | ICD-10-CM

## 2017-02-16 DIAGNOSIS — K921 Melena: Secondary | ICD-10-CM | POA: Diagnosis not present

## 2017-02-16 DIAGNOSIS — E039 Hypothyroidism, unspecified: Secondary | ICD-10-CM | POA: Diagnosis not present

## 2017-02-16 DIAGNOSIS — E1065 Type 1 diabetes mellitus with hyperglycemia: Secondary | ICD-10-CM | POA: Diagnosis not present

## 2017-02-16 DIAGNOSIS — E782 Mixed hyperlipidemia: Secondary | ICD-10-CM | POA: Diagnosis not present

## 2017-02-16 DIAGNOSIS — E663 Overweight: Secondary | ICD-10-CM

## 2017-02-16 DIAGNOSIS — E785 Hyperlipidemia, unspecified: Secondary | ICD-10-CM | POA: Diagnosis not present

## 2017-02-16 DIAGNOSIS — I6350 Cerebral infarction due to unspecified occlusion or stenosis of unspecified cerebral artery: Secondary | ICD-10-CM | POA: Diagnosis not present

## 2017-02-16 MED ORDER — AMLODIPINE BESYLATE 2.5 MG PO TABS: 2.5000 mg | ORAL_TABLET | Freq: Every day | ORAL | 1 refills | Status: DC

## 2017-02-16 NOTE — Assessment & Plan Note (Signed)
On Levothyroxine, continue to monitor 

## 2017-02-16 NOTE — Progress Notes (Signed)
Subjective:  I acted as a Education administrator for Dr. [name]. [place], [place]  Patient ID: Tanner [name], male    DOB: March 12, 1935, 82 y.o.   MRN: [id number]  No chief complaint on file.   HPI  Patient is in today for a follow up. She reports overall feeling well but is concerned about a couple of episodes of bright red blood on his tissue after wiping after bowel movements. He has had to strain sometimes when this occurs but not always. He does have a history of constipation but with the use of MiraLAX and Benefiber daily he mostly controls this. His bigger concern is that at times his stool loose and he has episodes of slight incontinence of his bowels and this is the case. No recent febrile illness or hospitalization. Denies CP/palp/SOB/HA/congestion/fevers or GU c/o. Taking meds as prescribed  Patient Care Team: [name], MD as PCP - Loman Brooklyn, Stephan Minister, DO as Consulting Physician (Neurology) Early, Arvilla Meres, MD as Consulting Physician (Vascular Surgery) Druscilla Brownie, MD as Consulting Physician (Dermatology) Pyrtle, Lajuan Lines, MD as Consulting Physician (Gastroenterology)   Past Medical History:  Diagnosis Date  . Allergy    seasonal- mostly spring  . Anemia 02/03/2011  . ASTHMA, CHILDHOOD 03/17/2010   mostly as child  . BCC (basal cell carcinoma) 12/08/2011  . CAROTID ARTERY OCCLUSION, WITH INFARCTION 03/17/2010  . CEREBROVASCULAR ACCIDENT 03/17/2010   partial loss of peripheral vision on left-"slight improved"  . CHICKENPOX, HX OF 03/17/2010  . Constipation 12/08/2014  . Epistaxis 09/06/2013  . FATIGUE 03/17/2010  . Hearing loss 11/04/2010   bilateral  . HYPERKALEMIA 05/27/2010  . Hypertension   . Hypothyroid 12/08/2011  . Mixed hyperlipidemia 03/17/2010  . Oral lesion 11/20/2014  . Overweight(278.02) 07/01/2010  . Peripheral neuropathy 08/18/2016  . Personal history of other infectious and parasitic disease 03/17/2010  . Preventative health care 08/23/2016  . Skin lesion of right arm  11/20/2014  . Sleep apnea    uses mouthpiece only  . Thrombocytopenia ([place]) 04/05/2012  . TOBACCO ABUSE, HX OF 03/17/2010  . Tubular adenoma of colon 06/10/2011    Past Surgical History:  Procedure Laterality Date  . CAROTID ENDARTERECTOMY Left February 13, 2013   cea  . COLONOSCOPY  Dec. 2014  . ENDARTERECTOMY Left 02/13/2013   Procedure: ENDARTERECTOMY CAROTID;  Surgeon: Rosetta Posner, MD;  Location: [facility];  Service: Vascular;  Laterality: Left;  . EUS N/A 08/17/2013   Procedure: UPPER ENDOSCOPIC ULTRASOUND (EUS) LINEAR;  Surgeon: Milus Banister, MD;  Location: [facility];  Service: Endoscopy;  Laterality: N/A;  . EYE SURGERY Right    Cataract, and stigmatism  . knee cartliage repair  1964, 1990   bilateral  . [place]   removed  . ROTATOR CUFF REPAIR     right  . TONSILLECTOMY    . UPPER GI ENDOSCOPY  Jan. 2015    Family History  Problem Relation Age of Onset  . Alzheimer's disease Mother   . Emphysema Sister        cigarettes  . Cancer Sister        lung, tobacco   . Heart disease Son   . Coronary artery disease Brother   . Heart attack Brother   . Other Brother        heart problems- from fathers side  . Cancer Brother 54       lung? smoker  . Heart disease Brother 63  Heart disease before age 48  . Cancer Sister        gyn  . Heart disease Sister   . Bipolar disorder Daughter   . Obesity Daughter   . Stroke Father   . Heart disease Sister     Social History   Social History  . Marital status: Divorced    Spouse name: N/A  . Number of children: 3  . Years of education: 84yrs   Occupational History  . retired Retired   Social History Main Topics  . Smoking status: Former Smoker    Packs/day: 2.00    Years: 20.00    Types: Cigarettes    Quit date: 07/19/1977  . Smokeless tobacco: Never Used  . Alcohol use 4.2 oz/week    7 Glasses of wine per week     Comment: dinner wine daily  . Drug use: No  . Sexual activity: No    Other Topics Concern  . Not on file   Social History Narrative   Pt lives at home alone.    Caffeine Use: very little   Heart healthy diet, is planning exercise      Retired from [employer]    Outpatient Medications Prior to Visit  Medication Sig Dispense Refill  . aspirin EC 81 MG tablet Take 81 mg by mouth daily.    [name] b complex vitamins tablet Take 1 tablet by mouth daily.    . chlorthalidone (HYGROTON) 25 MG tablet TAKE 1 TABLET BY MOUTH  EVERY MORNING 90 tablet 0  . Cholecalciferol (VITAMIN D PO) Take 2,000 mg by mouth daily.     . clopidogrel (PLAVIX) 75 MG tablet TAKE 1 TABLET BY MOUTH  DAILY WITH BREAKFAST 90 tablet 1  . Coenzyme Q10 (CO Q-10) 300 MG CAPS Take 1 capsule by mouth daily.    . Glucosamine-Chondroit-Vit C-Mn (GLUCOSAMINE 1500 COMPLEX PO) Take 1,500 mg by mouth daily.     [name] levothyroxine (SYNTHROID, LEVOTHROID) 75 MCG tablet TAKE 1 TABLET BY MOUTH  DAILY 90 tablet 0  . magnesium oxide (MAG-OX) 400 MG tablet Take 400 mg by mouth daily.    [name] MEGARED OMEGA-3 KRILL OIL 500 MG CAPS Take 1,000 mg by mouth.    . pantoprazole (PROTONIX) 40 MG tablet TAKE 1 TABLET BY MOUTH  DAILY 90 tablet 1  . Probiotic Product (PROBIOTIC PO) Take 1 capsule by mouth daily.     . ramipril (ALTACE) 10 MG capsule TAKE 1 CAPSULE BY MOUTH TWO TIMES DAILY 180 capsule 0  . rosuvastatin (CRESTOR) 40 MG tablet TAKE 1 TABLET BY MOUTH  EVERY EVENING 90 tablet 0  . Turmeric (CURCUMIN 95) 500 MG CAPS Take 500 mg by mouth.    . chlorthalidone (HYGROTON) 25 MG tablet TAKE 1 TABLET BY MOUTH  EVERY MORNING 90 tablet 2   No facility-administered medications prior to visit.     No Known Allergies  Review of Systems  Constitutional: Positive for malaise/fatigue. Negative for fever.  HENT: Negative for congestion.   Eyes: Negative for blurred vision.  Respiratory: Negative for cough and shortness of breath.   Cardiovascular: Negative for chest pain, palpitations and leg swelling.   Gastrointestinal: Positive for blood in stool and constipation. Negative for abdominal pain, heartburn, nausea and vomiting.  Musculoskeletal: Negative for back pain.  Skin: Negative for rash.  Neurological: Negative for loss of consciousness and headaches.       Objective:    Physical Exam  Constitutional: He is oriented to person, place,  and time. He appears well-developed and well-nourished. No distress.  HENT:  Head: Normocephalic and atraumatic.  Eyes: Conjunctivae are normal.  Neck: Normal range of motion. No thyromegaly present.  Cardiovascular: Normal rate and regular rhythm.   Pulmonary/Chest: Effort normal and breath sounds normal. He has no wheezes.  Abdominal: Soft. Bowel sounds are normal. There is no tenderness.  Musculoskeletal: Normal range of motion. He exhibits no edema or deformity.  Neurological: He is alert and oriented to person, place, and time.  Skin: Skin is warm and dry. He is not diaphoretic.  Psychiatric: He has a normal mood and affect.  Nursing note and vitals reviewed.   BP (!) 158/86   Pulse 68   Temp 98.2 F (36.8 C) (Oral)   Resp 18   Wt 211 lb (95.7 kg)   SpO2 98%   BMI 27.09 kg/m  Wt Readings from Last 3 Encounters:  02/16/17 211 lb (95.7 kg)  08/18/16 208 lb 3.2 oz (94.4 kg)  05/15/16 208 lb (94.3 kg)   BP Readings from Last 3 Encounters:  02/16/17 (!) 158/86  08/18/16 (!) 150/72  05/15/16 130/70     Immunization History  Administered Date(s) Administered  . Influenza Split 04/05/2012  . Influenza Whole 04/19/2009, 05/06/2011  . Influenza, High Dose Seasonal PF 05/09/2013, 04/13/2016  . Influenza-Unspecified 03/29/2014, 05/07/2015  . Pneumococcal Conjugate-13 05/17/2014  . Pneumococcal Polysaccharide-23 07/21/2007  . Td 07/21/2003  . Tdap 06/10/2011  . Zoster 07/21/2007    Health Maintenance  Topic Date Due  . FOOT EXAM  01/17/1947  . OPHTHALMOLOGY EXAM  01/17/1947  . HEMOGLOBIN A1C  06/04/2016  . INFLUENZA VACCINE   02/17/2017  . COLONOSCOPY  08/03/2018  . TETANUS/TDAP  06/09/2021  . PNA vac Low Risk Adult  Completed    Lab Results  Component Value Date   WBC 5.9 02/09/2017   HGB 14.7 02/09/2017   HCT 44.4 02/09/2017   PLT 175.0 02/09/2017   GLUCOSE 100 (H) 02/09/2017   CHOL 151 02/09/2017   TRIG 119.0 02/09/2017   HDL 47.70 02/09/2017   LDLCALC 79 02/09/2017   ALT 14 02/09/2017   AST 16 02/09/2017   NA 140 02/09/2017   K 4.4 02/09/2017   CL 104 02/09/2017   CREATININE 1.12 02/09/2017   BUN 18 02/09/2017   CO2 31 02/09/2017   TSH 3.36 02/09/2017   PSA 0.39 05/08/2014   INR 0.99 02/07/2013   HGBA1C 5.8 12/03/2015   MICROALBUR 2.0 (H) 12/03/2015    Lab Results  Component Value Date   TSH 3.36 02/09/2017   Lab Results  Component Value Date   WBC 5.9 02/09/2017   HGB 14.7 02/09/2017   HCT 44.4 02/09/2017   MCV 96.1 02/09/2017   PLT 175.0 02/09/2017   Lab Results  Component Value Date   NA 140 02/09/2017   K 4.4 02/09/2017   CO2 31 02/09/2017   GLUCOSE 100 (H) 02/09/2017   BUN 18 02/09/2017   CREATININE 1.12 02/09/2017   BILITOT 1.0 02/09/2017   ALKPHOS 56 02/09/2017   AST 16 02/09/2017   ALT 14 02/09/2017   PROT 6.8 02/09/2017   ALBUMIN 4.5 02/09/2017   CALCIUM 10.1 02/09/2017   GFR 67.04 02/09/2017   Lab Results  Component Value Date   CHOL 151 02/09/2017   Lab Results  Component Value Date   HDL 47.70 02/09/2017   Lab Results  Component Value Date   LDLCALC 79 02/09/2017   Lab Results  Component Value Date  TRIG 119.0 02/09/2017   Lab Results  Component Value Date   CHOLHDL 3 02/09/2017   Lab Results  Component Value Date   HGBA1C 5.8 12/03/2015         Assessment & Plan:   Problem List Items Addressed This Visit    Hypothyroid (Chronic)    On Levothyroxine, continue to monitor      Mixed hyperlipidemia    Tolerating statin, encouraged heart healthy diet, avoid trans fats, minimize simple carbs and saturated fats. Increase exercise as  tolerated      Relevant Medications   amLODipine (NORVASC) 2.5 MG tablet   Essential hypertension, benign    Not well controlled, no changes to meds. Encouraged heart healthy diet such as the DASH diet and exercise as tolerated. He feel it is elevated secondary to agitation over having to wait too long in the office.       Relevant Medications   amLODipine (NORVASC) 2.5 MG tablet   Other Relevant Orders   CBC   Comprehensive metabolic panel   TSH   Cerebral artery occlusion with cerebral infarction (HCC)    No new or recurrent concerns      Relevant Medications   amLODipine (NORVASC) 2.5 MG tablet   Overweight    Encouraged DASH diet, decrease po intake and increase exercise as tolerated. Needs 7-8 hours of sleep nightly. Avoid trans fats, eat small, frequent meals every 4-5 hours with lean proteins, complex carbs and healthy fats. Minimize simple carbs      Incontinence of feces - Primary    Mostly noted with soft stool but also notes some blood on tissue at times, is due for colonoscopy and is otherwise in good health. So will refer to gastroenterology for further consideration.       Relevant Orders   Ambulatory referral to Gastroenterology   Fecal occult blood, imunochemical    Other Visit Diagnoses    Blood in stool       Relevant Orders   Ambulatory referral to Gastroenterology   Fecal occult blood, imunochemical   Type 1 diabetes mellitus with hyperglycemia ([place])       Relevant Orders   Hemoglobin A1c   Hyperlipidemia, unspecified hyperlipidemia type       Relevant Medications   amLODipine (NORVASC) 2.5 MG tablet   Other Relevant Orders   Lipid panel      I am having Mr. Soules start on amLODipine. I am also having him maintain his Co Q-10, Cholecalciferol (VITAMIN D PO), b complex vitamins, Probiotic Product (PROBIOTIC PO), aspirin EC, Glucosamine-Chondroit-Vit C-Mn (GLUCOSAMINE 1500 COMPLEX PO), magnesium oxide, Turmeric, MEGARED OMEGA-3 KRILL OIL,  pantoprazole, clopidogrel, ramipril, rosuvastatin, chlorthalidone, and levothyroxine.  Meds ordered this encounter  Medications  . amLODipine (NORVASC) 2.5 MG tablet    Sig: Take 1 tablet (2.5 mg total) by mouth daily.    Dispense:  90 tablet    Refill:  1    CMA served as Education administrator during this visit. History, Physical and Plan performed by medical provider. Documentation and orders reviewed and attested to.  Penni Homans, MD

## 2017-02-16 NOTE — Assessment & Plan Note (Signed)
Encouraged DASH diet, decrease po intake and increase exercise as tolerated. Needs 7-8 hours of sleep nightly. Avoid trans fats, eat small, frequent meals every 4-5 hours with lean proteins, complex carbs and healthy fats. Minimize simple carbs 

## 2017-02-16 NOTE — Assessment & Plan Note (Addendum)
Not well controlled, no changes to meds. Encouraged heart healthy diet such as the DASH diet and exercise as tolerated. He feel it is elevated secondary to agitation over having to wait too long in the office.

## 2017-02-16 NOTE — Patient Instructions (Signed)

## 2017-02-21 DIAGNOSIS — R159 Full incontinence of feces: Secondary | ICD-10-CM | POA: Insufficient documentation

## 2017-02-21 NOTE — Assessment & Plan Note (Signed)
No new or recurrent concerns

## 2017-02-21 NOTE — Assessment & Plan Note (Signed)
Tolerating statin, encouraged heart healthy diet, avoid trans fats, minimize simple carbs and saturated fats. Increase exercise as tolerated 

## 2017-02-21 NOTE — Assessment & Plan Note (Signed)
Mostly noted with soft stool but also notes some blood on tissue at times, is due for colonoscopy and is otherwise in good health. So will refer to gastroenterology for further consideration.

## 2017-02-22 ENCOUNTER — Other Ambulatory Visit (INDEPENDENT_AMBULATORY_CARE_PROVIDER_SITE_OTHER): Payer: Medicare Other

## 2017-02-22 DIAGNOSIS — R159 Full incontinence of feces: Secondary | ICD-10-CM

## 2017-02-22 DIAGNOSIS — K921 Melena: Secondary | ICD-10-CM | POA: Diagnosis not present

## 2017-02-22 LAB — FECAL OCCULT BLOOD, IMMUNOCHEMICAL: FECAL OCCULT BLD: NEGATIVE

## 2017-04-06 ENCOUNTER — Encounter: Payer: Self-pay | Admitting: *Deleted

## 2017-04-08 ENCOUNTER — Encounter: Payer: Self-pay | Admitting: Family Medicine

## 2017-04-09 MED ORDER — AMLODIPINE BESYLATE 2.5 MG PO TABS: 2.5000 mg | ORAL_TABLET | Freq: Every day | ORAL | 2 refills | Status: DC

## 2017-04-09 MED ORDER — RAMIPRIL 10 MG PO CAPS: 10.0000 mg | ORAL_CAPSULE | Freq: Two times a day (BID) | ORAL | 2 refills | Status: DC

## 2017-04-09 MED ORDER — CHLORTHALIDONE 25 MG PO TABS: 25.0000 mg | ORAL_TABLET | Freq: Every morning | ORAL | 2 refills | Status: DC

## 2017-04-13 ENCOUNTER — Other Ambulatory Visit: Payer: Self-pay

## 2017-04-13 DIAGNOSIS — I6523 Occlusion and stenosis of bilateral carotid arteries: Secondary | ICD-10-CM

## 2017-04-16 ENCOUNTER — Ambulatory Visit (INDEPENDENT_AMBULATORY_CARE_PROVIDER_SITE_OTHER): Payer: Medicare Other

## 2017-04-16 ENCOUNTER — Other Ambulatory Visit: Payer: Self-pay | Admitting: Family Medicine

## 2017-04-16 DIAGNOSIS — Z23 Encounter for immunization: Secondary | ICD-10-CM | POA: Diagnosis not present

## 2017-04-16 MED ORDER — ROSUVASTATIN CALCIUM 40 MG PO TABS: 40.0000 mg | ORAL_TABLET | Freq: Every evening | ORAL | 1 refills | Status: DC

## 2017-04-16 MED ORDER — ROSUVASTATIN CALCIUM 40 MG PO TABS: 40.0000 mg | ORAL_TABLET | Freq: Every evening | ORAL | 0 refills | Status: DC

## 2017-04-16 MED FILL — ROSUVASTATIN CALCIUM 40 MG: 40 | 7 days supply | Qty: 7 | Fill #0

## 2017-04-19 ENCOUNTER — Ambulatory Visit (INDEPENDENT_AMBULATORY_CARE_PROVIDER_SITE_OTHER): Payer: Medicare Other | Admitting: Internal Medicine

## 2017-04-19 ENCOUNTER — Encounter: Payer: Self-pay | Admitting: Internal Medicine

## 2017-04-19 VITALS — BP 152/70 | HR 68 | Ht 72.5 in | Wt 210.2 lb

## 2017-04-19 DIAGNOSIS — K5730 Diverticulosis of large intestine without perforation or abscess without bleeding: Secondary | ICD-10-CM | POA: Diagnosis not present

## 2017-04-19 DIAGNOSIS — R150 Incomplete defecation: Secondary | ICD-10-CM

## 2017-04-19 DIAGNOSIS — K5900 Constipation, unspecified: Secondary | ICD-10-CM | POA: Diagnosis not present

## 2017-04-19 MED ORDER — LINACLOTIDE 72 MCG PO CAPS: 72.0000 ug | ORAL_CAPSULE | Freq: Every day | ORAL | 2 refills | Status: DC

## 2017-04-19 NOTE — Progress Notes (Signed)
Subjective:    Patient ID: Tanner [name], male    DOB: [birth year], 82 y.o.   MRN: [id number]  HPI Tanner [name] is an 82 year old male with a past medical history of adenomatous colon polyps, colonic diverticulosis, constipation, hypertension, hyperlipidemia, hypothyroidism who is seen in follow-up. He is referred back by Dr. [name] for incomplete evacuation and constipation symptoms. He is here alone today. He is known to me from upper endoscopy and colonoscopy performed in 2014 to evaluate heme positive stools. This led to his second colonoscopy which was performed on 08/03/2013. This revealed a normal terminal ileum, 23-5 mm polyps removed from the ascending colon and moderate left-sided diverticulosis. One of these polyps was adenomatous. Small external hemorrhoids were found during this examination. He had an upper endoscopy performed on the same day which showed mild reflux esophagitis, mild erosive gastritis and a small submucosal nodule which was later not found by EUS. The duodenum was unremarkable. Gastric biopsies were negative for H. pylori and showed mild chronic inactive gastritis.  He reports that he has been having issues with incomplete defecation, some fecal smearing and an inability to "get clean" after bowel movement. He has been using MiraLAX and Benefiber about every other day. He uses these daily he has loose stools throughout the day. He's had some minor lower abdominal crampy type discomfort and some abdominal bloating. No other abdominal pain. He denies upper GI complaint. He has not seen blood in his stool or melena. He does report having 2 occult blood tests performed in the last year both negative.  Review of Systems As per history of present illness, otherwise negative  Current Medications, Allergies, Past Medical History, Past Surgical History, Family History and Social History were reviewed in [employer] record.     Objective:   Physical  Exam BP (!) 152/70 (BP Location: Left Arm, Patient Position: Sitting, Cuff Size: Normal)   Pulse 68   Ht 6' 0.5" (1.842 m) Comment: height measured without shoes  Wt 210 lb 4 oz (95.4 kg)   BMI 28.12 kg/m  Constitutional: Well-developed and well-nourished. No distress. HEENT: Normocephalic and atraumatic. Conjunctivae are normal.  No scleral icterus. Neck: Neck supple. Trachea midline. Cardiovascular: Normal rate, regular rhythm and intact distal pulses.  Pulmonary/chest: Effort normal and breath sounds normal. No wheezing, rales or rhonchi. Abdominal: Soft, nontender, nondistended. Bowel sounds active throughout. There are no masses palpable Rectal: small perianal skin tags, small prolapsing internal hemorrhoids, nontender, normal tone, no masses, very soft heme negative brown stool in the rectal vault Extremities: no clubbing, cyanosis, or edema Neurological: Alert and oriented to person place and time. Skin: Skin is warm and dry. Psychiatric: Normal mood and affect. Behavior is normal.  CBC    Component Value Date/Time   WBC 5.9 02/09/2017 0809   RBC 4.62 02/09/2017 0809   HGB 14.7 02/09/2017 0809   HCT 44.4 02/09/2017 0809   PLT 175.0 02/09/2017 0809   MCV 96.1 02/09/2017 0809   MCH 31.1 08/14/2013 1532   MCHC 33.2 02/09/2017 0809   RDW 14.4 02/09/2017 0809   LYMPHSABS 1.8 02/09/2017 0809   MONOABS 0.5 02/09/2017 0809   EOSABS 0.4 02/09/2017 0809   BASOSABS 0.1 02/09/2017 0809       Assessment & Plan:  82 year old male with a past medical history of adenomatous colon polyps, colonic diverticulosis, constipation, hypertension, hyperlipidemia, hypothyroidism who is seen in follow-up.  1. Constipation/incomplete defecation/colonic diverticulosis/hx of colon polyps -- After discussion and physical examination I  feel that his incomplete defecation is likely result of constipation, less than ideal response to MiraLAX therapy and likely impart lack of overall colonic compliance  due to diverticulosis. Rectal exam reveals no masses or outlet obstruction. His colonoscopy is up-to-date and he would be due surveillance in January 2020.  MiraLAX has soften his stool but not made stool easier to pass. I'm going to try him on Linzess 72 g daily. Stop MiraLAX and Benefiber. May need to dose titrate MiraLAX and I asked that he notify me if he develops diarrhea as this would not be the intention of Linzess. If no response after trial of a different laxative we can consider repeating colonoscopy. He is happy with this plan.  2. History of colon polyps -- he is doing well at age 26. We will consider surveillance colonoscopy around January 2020 unless needed sooner to evaluate symptoms as discussed in #1  Return in about 3 months, sooner if needed

## 2017-04-19 NOTE — Patient Instructions (Addendum)
We have sent the following medications to your pharmacy for you to pick up at your convenience: linzess 72 mcg daily- call our office if you have no improvement within 1 week or if you develop diarrhea  Follow up with Dr Hilarie Fredrickson in 3 months.  Please discontinue Benefiber and Miralax.  If you are age 80 or older, your body mass index should be between 23-30. Your Body mass index is 28.12 kg/m. If this is out of the aforementioned range listed, please consider follow up with your Primary Care Provider.  If you are age 53 or younger, your body mass index should be between 19-25. Your Body mass index is 28.12 kg/m. If this is out of the aformentioned range listed, please consider follow up with your Primary Care Provider.

## 2017-04-20 ENCOUNTER — Ambulatory Visit (HOSPITAL_COMMUNITY)
Admission: RE | Admit: 2017-04-20 | Discharge: 2017-04-20 | Disposition: A | Discharge status: Home / Self Care | Payer: Medicare Other | Source: Ambulatory Visit | Attending: Vascular Surgery | Admitting: Vascular Surgery

## 2017-04-20 ENCOUNTER — Encounter: Payer: Self-pay | Admitting: Family

## 2017-04-20 ENCOUNTER — Ambulatory Visit (INDEPENDENT_AMBULATORY_CARE_PROVIDER_SITE_OTHER): Payer: Medicare Other | Admitting: Family

## 2017-04-20 VITALS — BP 149/77 | HR 53 | Temp 97.4°F | Resp 16 | Ht 72.5 in | Wt 211.0 lb

## 2017-04-20 DIAGNOSIS — I6523 Occlusion and stenosis of bilateral carotid arteries: Secondary | ICD-10-CM

## 2017-04-20 DIAGNOSIS — Z87891 Personal history of nicotine dependence: Secondary | ICD-10-CM

## 2017-04-20 DIAGNOSIS — I6521 Occlusion and stenosis of right carotid artery: Secondary | ICD-10-CM | POA: Diagnosis not present

## 2017-04-20 DIAGNOSIS — Z9889 Other specified postprocedural states: Secondary | ICD-10-CM

## 2017-04-20 DIAGNOSIS — I6522 Occlusion and stenosis of left carotid artery: Secondary | ICD-10-CM | POA: Diagnosis not present

## 2017-04-20 DIAGNOSIS — Z48812 Encounter for surgical aftercare following surgery on the circulatory system: Secondary | ICD-10-CM | POA: Diagnosis not present

## 2017-04-20 LAB — VAS US CAROTID
LCCADSYS: 108 cm/s
LCCAPDIAS: 22 cm/s
LEFT ECA DIAS: -16 cm/s
LICADSYS: -54 cm/s
LICAPDIAS: -35 cm/s
LICAPSYS: -101 cm/s
Left CCA dist dias: 31 cm/s
Left CCA prox sys: 108 cm/s
Left ICA dist dias: -21 cm/s
RCCAPSYS: -53 cm/s

## 2017-04-20 NOTE — Patient Instructions (Signed)
Stroke Prevention Some medical conditions and behaviors are associated with an increased chance of having a stroke. You may prevent a stroke by making healthy choices and managing medical conditions. How can I reduce my risk of having a stroke?  Stay physically active. Get at least 30 minutes of activity on most or all days.  Do not smoke. It may also be helpful to avoid exposure to secondhand smoke.  Limit alcohol use. Moderate alcohol use is considered to be: ? No more than 2 drinks per day for men. ? No more than 1 drink per day for nonpregnant women.  Eat healthy foods. This involves: ? Eating 5 or more servings of fruits and vegetables a day. ? Making dietary changes that address high blood pressure (hypertension), high cholesterol, diabetes, or obesity.  Manage your cholesterol levels. ? Making food choices that are high in fiber and low in saturated fat, trans fat, and cholesterol may control cholesterol levels. ? Take any prescribed medicines to control cholesterol as directed by your health care provider.  Manage your diabetes. ? Controlling your carbohydrate and sugar intake is recommended to manage diabetes. ? Take any prescribed medicines to control diabetes as directed by your health care provider.  Control your hypertension. ? Making food choices that are low in salt (sodium), saturated fat, trans fat, and cholesterol is recommended to manage hypertension. ? Ask your health care provider if you need treatment to lower your blood pressure. Take any prescribed medicines to control hypertension as directed by your health care provider. ? If you are 18-39 years of age, have your blood pressure checked every 3-5 years. If you are 40 years of age or older, have your blood pressure checked every year.  Maintain a healthy weight. ? Reducing calorie intake and making food choices that are low in sodium, saturated fat, trans fat, and cholesterol are recommended to manage  weight.  Stop drug abuse.  Avoid taking birth control pills. ? Talk to your health care provider about the risks of taking birth control pills if you are over 35 years old, smoke, get migraines, or have ever had a blood clot.  Get evaluated for sleep disorders (sleep apnea). ? Talk to your health care provider about getting a sleep evaluation if you snore a lot or have excessive sleepiness.  Take medicines only as directed by your health care provider. ? For some people, aspirin or blood thinners (anticoagulants) are helpful in reducing the risk of forming abnormal blood clots that can lead to stroke. If you have the irregular heart rhythm of atrial fibrillation, you should be on a blood thinner unless there is a good reason you cannot take them. ? Understand all your medicine instructions.  Make sure that other conditions (such as anemia or atherosclerosis) are addressed. Get help right away if:  You have sudden weakness or numbness of the face, arm, or leg, especially on one side of the body.  Your face or eyelid droops to one side.  You have sudden confusion.  You have trouble speaking (aphasia) or understanding.  You have sudden trouble seeing in one or both eyes.  You have sudden trouble walking.  You have dizziness.  You have a loss of balance or coordination.  You have a sudden, severe headache with no known cause.  You have new chest pain or an irregular heartbeat. Any of these symptoms may represent a serious problem that is an emergency. Do not wait to see if the symptoms will go away.   Get medical help at once. Call your local emergency services (911 in [place] Do not drive yourself to the hospital. This information is not intended to replace advice given to you by your health care provider. Make sure you discuss any questions you have with your health care provider. Document Released: 08/13/2004 Document Revised: 12/12/2015 Document Reviewed: 01/06/2013 Elsevier  Interactive Patient Education  2017 Elsevier Inc.     Preventing Cerebrovascular Disease Arteries are blood vessels that carry blood that contains oxygen from the heart to all parts of the body. Cerebrovascular disease affects arteries that supply the brain. Any condition that blocks or disrupts blood flow to the brain can cause cerebrovascular disease. Brain cells that lose blood supply start to die within minutes (stroke). Stroke is the main danger of cerebrovascular disease. Atherosclerosis and high blood pressure are common causes of cerebrovascular disease. Atherosclerosis is narrowing and hardening of an artery that results when fat, cholesterol, calcium, or other substances (plaque) build up inside an artery. Plaque reduces blood flow through the artery. High blood pressure increases the risk of bleeding inside the brain. Making diet and lifestyle changes to prevent atherosclerosis and high blood pressure lowers your risk of cerebrovascular disease. What nutrition changes can be made?  Eat more fruits, vegetables, and whole grains.  Reduce how much saturated fat you eat. To do this, eat less red meat and fewer full-fat dairy products.  Eat healthy proteins instead of red meat. Healthy proteins include: ? Fish. Eat fish that contains heart-healthy omega-3 fatty acids, twice a week. Examples include salmon, albacore tuna, mackerel, and herring. ? Chicken. ? Nuts. ? Low-fat or nonfat yogurt.  Avoid processed meats, like bacon and lunchmeat.  Avoid foods that contain: ? A lot of sugar, such as sweets and drinks with added sugar. ? A lot of salt (sodium). Avoid adding extra salt to your food, as told by your health care provider. ? Trans fats, such as margarine and baked goods. Trans fats may be listed as "partially hydrogenated oils" on food labels.  Check food labels to see how much sodium, sugar, and trans fats are in foods.  Use vegetable oils that contain low amounts of  saturated fat, such as olive oil or canola oil. What lifestyle changes can be made?  Drink alcohol in moderation. This means no more than 1 drink a day for nonpregnant women and 2 drinks a day for men. One drink equals 12 oz of beer, 5 oz of wine, or 1 oz of hard liquor.  If you are overweight, ask your health care provider to recommend a weight-loss plan for you. Losing 5-10 lb (2.2-4.5 kg) can reduce your risk of diabetes, atherosclerosis, and high blood pressure.  Exercise for 30?60 minutes on most days, or as much as told by your health care provider. ? Do moderate-intensity exercise, such as brisk walking, bicycling, and water aerobics. Ask your health care provider which activities are safe for you.  Do not use any products that contain nicotine or tobacco, such as cigarettes and e-cigarettes. If you need help quitting, ask your health care provider. Why are these changes important? Making these changes lowers your risk of many diseases that can cause cerebrovascular disease and stroke. Stroke is a leading cause of death and disability. Making these changes also improves your overall health and quality of life. What can I do to lower my risk? The following factors make you more likely to develop cerebrovascular disease:  Being overweight.  Smoking.  Being physically inactive.    Eating a high-fat diet.  Having certain health conditions, such as: ? Diabetes. ? High blood pressure. ? Heart disease. ? Atherosclerosis. ? High cholesterol. ? Sickle cell disease.  Talk with your health care provider about your risk for cerebrovascular disease. Work with your health care provider to control diseases that you have that may contribute to cerebrovascular disease. Your health care provider may prescribe medicines to help prevent major causes of cerebrovascular disease. Where to find more information: Learn more about preventing cerebrovascular disease from:  National Heart, Lung, and  Blood Institute: www.nhlbi.nih.gov/health/health-topics/topics/stroke  Centers for Disease Control and Prevention: cdc.gov/stroke/about.htm  Summary  Cerebrovascular disease can lead to a stroke.  Atherosclerosis and high blood pressure are major causes of cerebrovascular disease.  Making diet and lifestyle changes can reduce your risk of cerebrovascular disease.  Work with your health care provider to get your risk factors under control to reduce your risk of cerebrovascular disease. This information is not intended to replace advice given to you by your health care provider. Make sure you discuss any questions you have with your health care provider. Document Released: 07/21/2015 Document Revised: 01/24/2016 Document Reviewed: 07/21/2015 Elsevier Interactive Patient Education  2018 Elsevier Inc.  

## 2017-04-20 NOTE — Progress Notes (Signed)
Chief Complaint: Follow up Extracranial Carotid Artery Stenosis   History of Present Illness  Tanner [name] is a 82 y.o. male patient of [name] who is s/p left carotid endarterectomy on 02/13/2013 for severe asymptomatic disease.  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 He returns today for follow up. He had a TIA in 2011 or 2012 as manifested by transient lateral loss of vision in left eye, denies any other neurological symptoms, specifically denies hemiparesis, denies aphasia symptoms. He specifically denies any further transient ischemic attacks or stroke activity.  Patient states he was evaluated by [name], his current neurologist is [name]. Pt reports that he has neuropathy of unknown etiology, in his feet as manifested by some numbness.  He no longer feels the numbness in his left arm that he was having. This seems to be mostly positional and can occur when he first arises in the morning. He is left-handed.   He denies feeling light headed other than when he gets up too quick, denies chest pain or dyspnea.   He states his PCP added another antihypertensive medication, his blood pressure is being monitored and medications adjusted to get his blood pressure on control.  He denies claudication in legs with walking, denies non healing wounds.  Pt Diabetic: No Pt smoker: former smoker, quit in 1978  Pt meds include: Statin : Yes ASA: Yes Other anticoagulants/antiplatelets: Plavix    Past Medical History:  Diagnosis Date  . Allergy    seasonal- mostly spring  . Anemia 02/03/2011  . ASTHMA, CHILDHOOD 03/17/2010   mostly as child  . BCC (basal cell carcinoma) 12/08/2011  . CAROTID ARTERY OCCLUSION, WITH INFARCTION 03/17/2010  . CEREBROVASCULAR ACCIDENT 03/17/2010   partial loss of peripheral vision on left-"slight improved"  . CHICKENPOX, HX OF 03/17/2010  . Constipation 12/08/2014  . Epistaxis 09/06/2013  . FATIGUE 03/17/2010  . Hearing  loss 11/04/2010   bilateral  . HYPERKALEMIA 05/27/2010  . Hyperlipidemia   . Hypertension   . Hypothyroid 12/08/2011  . Mixed hyperlipidemia 03/17/2010  . Oral lesion 11/20/2014  . Overweight(278.02) 07/01/2010  . Peripheral neuropathy 08/18/2016  . Personal history of other infectious and parasitic disease 03/17/2010  . Preventative health care 08/23/2016  . Skin lesion of right arm 11/20/2014  . Sleep apnea    uses mouthpiece only  . Thrombocytopenia ([place]) 04/05/2012  . TOBACCO ABUSE, HX OF 03/17/2010  . Tubular adenoma of colon 06/10/2011    Social History Social History  Substance Use Topics  . Smoking status: Former Smoker    Packs/day: 2.00    Years: 20.00    Types: Cigarettes    Quit date: 07/19/1977  . Smokeless tobacco: Never Used  . Alcohol use 4.2 oz/week    7 Glasses of wine per week     Comment: dinner wine daily    Family History Family History  Problem Relation Age of Onset  . Alzheimer's disease Mother   . Emphysema Sister        cigarettes  . Cancer Sister        lung, tobacco   . Heart disease Son   . Coronary artery disease Brother   . Heart attack Brother   . Other Brother        heart problems- from fathers side  . Cancer Brother 51       lung? smoker  . Heart disease Brother 20       Heart disease before age 33  .  Cancer Sister        gyn  . Heart disease Sister   . Bipolar disorder Daughter   . Obesity Daughter   . Stroke Father   . Heart disease Sister     Surgical History Past Surgical History:  Procedure Laterality Date  . CAROTID ENDARTERECTOMY Left February 13, 2013   cea  . CATARACT EXTRACTION, BILATERAL Right    Cataract, and stigmatism  . COLONOSCOPY  Dec. 2014  . ENDARTERECTOMY Left 02/13/2013   Procedure: ENDARTERECTOMY CAROTID;  Surgeon: Rosetta Posner, MD;  Location: [facility];  Service: Vascular;  Laterality: Left;  . EUS N/A 08/17/2013   Procedure: UPPER ENDOSCOPIC ULTRASOUND (EUS) LINEAR;  Surgeon: Milus Banister, MD;  Location: [facility]  ENDOSCOPY;  Service: Endoscopy;  Laterality: N/A;  . knee cartliage repair  1964, 1990   bilateral  . [place]   removed  . ROTATOR CUFF REPAIR     right  . TONSILLECTOMY    . UPPER GI ENDOSCOPY  Jan. 2015    No Known Allergies  Current Outpatient Prescriptions  Medication Sig Dispense Refill  . amLODipine (NORVASC) 2.5 MG tablet Take 1 tablet (2.5 mg total) by mouth daily. 90 tablet 2  . aspirin EC 81 MG tablet Take 81 mg by mouth daily.    [name] b complex vitamins tablet Take 1 tablet by mouth daily.    . chlorthalidone (HYGROTON) 25 MG tablet Take 1 tablet (25 mg total) by mouth every morning. 90 tablet 2  . Cholecalciferol (VITAMIN D PO) Take 2,000 mg by mouth daily.     . clopidogrel (PLAVIX) 75 MG tablet TAKE 1 TABLET BY MOUTH  DAILY WITH BREAKFAST 90 tablet 1  . Coenzyme Q10 (CO Q-10) 300 MG CAPS Take 1 capsule by mouth daily.    . Glucosamine-Chondroit-Vit C-Mn (GLUCOSAMINE 1500 COMPLEX PO) Take 1,500 mg by mouth daily.     [name] levothyroxine (SYNTHROID, LEVOTHROID) 75 MCG tablet TAKE 1 TABLET BY MOUTH  DAILY 90 tablet 0  . linaclotide (LINZESS) 72 MCG capsule Take 1 capsule (72 mcg total) by mouth daily before breakfast. 30 capsule 2  . magnesium oxide (MAG-OX) 400 MG tablet Take 400 mg by mouth daily.    [name] MEGARED OMEGA-3 KRILL OIL 500 MG CAPS Take 1,000 mg by mouth.    . pantoprazole (PROTONIX) 40 MG tablet TAKE 1 TABLET BY MOUTH  DAILY 90 tablet 1  . Probiotic Product (PROBIOTIC PO) Take 1 capsule by mouth daily.     . ramipril (ALTACE) 10 MG capsule Take 1 capsule (10 mg total) by mouth 2 (two) times daily. 180 capsule 2  . rosuvastatin (CRESTOR) 40 MG tablet Take 1 tablet (40 mg total) by mouth every evening. 90 tablet 1  . Turmeric (CURCUMIN 95) 500 MG CAPS Take 500 mg by mouth.     No current facility-administered medications for this visit.     Review of Systems : See HPI for pertinent positives and negatives.  Physical Examination  Vitals:    04/20/17 0938 04/20/17 0943  BP: (!) 150/69 (!) 149/77  Pulse: (!) 53   Resp: 16   Temp: (!) 97.4 F (36.3 C)   TempSrc: Oral   SpO2: 100%   Weight: 211 lb (95.7 kg)   Height: 6' 0.5" (1.842 m)    Body mass index is 28.22 kg/m.  General: WDWN male in NAD GAIT:normal Eyes: PERRLA Pulmonary: Respirations are non-labored, CTAB, no rales, rhonchi, or wheezing.  Cardiac:  regular rhythm, bradycardic rate (not taking a beta blocker), no detected murmur.  VASCULAR EXAM Carotid Bruits Right Left   negative Negative    Abdominal aortic pulse is not palpable. Radial pulses are 2+ palpable and equal.      LE Pulses Right Left   POPLITEAL 1+palpable  not palpable   POSTERIOR TIBIAL not palpable  2+ palpable    DORSALIS PEDIS  ANTERIOR TIBIAL 2+ palpable  2+ palpable     Gastrointestinal: soft, nontender, BS WNL, no r/g, no palpable masses.  Musculoskeletal: No muscle atrophy/wasting. M/S 5/5 throughout, Extremities without ischemic changes.  Neurologic: A&O X 3; appropriate affect,speech is normal, CN 2-12 intact, Pain and light touch intact in extremities except slightly diminished sensation to touch in right toes, Motor exam as listed above.    Assessment: KHALEED HOLAN is a 82 y.o. male who is s/p left carotid endarterectomy on 02/13/2013 for severe asymptomatic disease.  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 He had a TIA in 2011 or 2012 as manifested by transient lateral loss of vision in left eye, no other neurological symptoms, no subsequent TIA or stroke activity.  DATA Carotid Duplex (04/20/17): Known occlusion of the right internal carotid artery. Right common carotid artery appears occluded. Patent left carotid endarterectomy site with evidence of mild  hyperplasia at the proximal and distal patch. Bilateral vertebral artery flow is antegrade.  Bilateral subclavian artery waveforms are normal.  No significant change in comparison to the last exams on 03/22/2014, 04/02/15, and 04/07/16.    Plan: Follow-up in 1 year with Carotid Duplex scan.    I discussed in depth with the patient the nature of atherosclerosis, and emphasized the importance of maximal medical management including strict control of blood pressure, blood glucose, and lipid levels, obtaining regular exercise, and continued cessation of smoking.  The patient is aware that without maximal medical management the underlying atherosclerotic disease process will progress, limiting the benefit of any interventions. The patient was given information about stroke prevention and what symptoms should prompt the patient to seek immediate medical care. Thank you for allowing [place] to participate in this patient's care.  Tanner Chambers, RN, MSN, FNP-C [facility] Office: [phone]  Clinic Physician: Early  04/20/17 9:46 AM

## 2017-04-27 ENCOUNTER — Encounter: Payer: Self-pay | Admitting: Neurology

## 2017-04-27 ENCOUNTER — Ambulatory Visit (INDEPENDENT_AMBULATORY_CARE_PROVIDER_SITE_OTHER): Payer: Medicare Other | Admitting: Neurology

## 2017-04-27 VITALS — BP 144/70 | HR 70 | Ht 74.0 in | Wt 211.2 lb

## 2017-04-27 DIAGNOSIS — Z9889 Other specified postprocedural states: Secondary | ICD-10-CM

## 2017-04-27 DIAGNOSIS — G609 Hereditary and idiopathic neuropathy, unspecified: Secondary | ICD-10-CM | POA: Diagnosis not present

## 2017-04-27 DIAGNOSIS — I10 Essential (primary) hypertension: Secondary | ICD-10-CM | POA: Diagnosis not present

## 2017-04-27 DIAGNOSIS — E782 Mixed hyperlipidemia: Secondary | ICD-10-CM | POA: Diagnosis not present

## 2017-04-27 DIAGNOSIS — I6521 Occlusion and stenosis of right carotid artery: Secondary | ICD-10-CM

## 2017-04-27 DIAGNOSIS — I63231 Cerebral infarction due to unspecified occlusion or stenosis of right carotid arteries: Secondary | ICD-10-CM | POA: Diagnosis not present

## 2017-04-27 NOTE — Patient Instructions (Signed)
1.  Follow up blood pressure with Dr. [name] 2.  Continue aspirin, Plavix and Lipitor 3.  Follow up with vascular surgery regarding carotid doppler 4.  Follow up with me in one year

## 2017-04-27 NOTE — Progress Notes (Signed)
NEUROLOGY FOLLOW UP OFFICE NOTE  Tanner [name] [id number]  HISTORY OF PRESENT ILLNESS: Tanner [name] is a 82 year old left-handed man with history of hypertension, hyperlipidemia, hypothyroidism, OSA and former smoker who follows up for right MCA/PCA watershed infarct, carotid artery disease with right ICA occlusion and status post left CEA, as well as neuropathy.   UPDATE: He is on dual antiplatelet therapy for secondary stroke prevention. Lipid panel from 02/09/17 demonstrated LDL of 79.  Repeat carotid doppler from 04/20/17 again demonstrated patent left ICA status post CEA with minimal hyperplasia without hemodynamically significant stenosis, as well as known right ICA occlusion.  The right CCA has completely occluded as well.  During the summer, he reports episodes of fatigue, but that seems to have improved with increased water intake.  He also reports pain in the joints of both thumbs, which may cause some mild difficulty with grip.  There is no associated numbness, tingling or weakness.   HISTORY: Stroke:   In August 2011, he had a stroke, presenting as left visual field cut.  He had a right hemispheric stroke thought to be secondary to right ICA occlusion.  MRI of brain revealed an acute watershed infarct in the right PCA/MCA distributions.  CTA of the neck confirmed right common carotid artery occlusion at the origin.  The left ICA revealed approximately 50% stenosis just distal to the bulb.  CTA of the head revealed occluded right ICA with partial retrograde constitution from the left INCA via the PCA.  There was opacification of the right MCA and right ACA from the left ICA via the Acomm.  There was occlusion of the right PCA at its origin.  Hgb A1c at that time was 5.6 and LDL was 210.   He was placed on Plavix.  Follow up carotid dopplers revealed increased stenosis of the left ICA.  Carotid doppler from 01/03/13 revealed more than 70% stenosis involving the left bulb and left  proximal ICA and 50-69% stenosis involving the left mid ICA, but without plaque.  Stenosis of left ECA and left VA noted.  CTA of the neck performed on 01/19/13 revealed 75-80% stenosis of the left ICA.  He underwent a left CEA on 02/13/13.  He continues to follow up with [name] from vascular surgery.   04/02/15 Carotid doppler:  Right ICA occluded.  Left ICA patent.  No plaque.09/28/13 Hgb A1c 5.7 02/07/13 2D Echo:  LVEF 55-65%, no regional wall motion abnormalities, mild MVR, mildly dilated left atrium, mildly dilated right ventricle, mildly increased systolic pressure in pulmonary arteries (PA peak pressure 56mm Hg).  He followed up with vascular surgery on 04/07/16.   Carotid doppler performed then again revealed known right ICA occlusion with patent left carotid endarterectomy site, not significantly changed compared to last exam.   Neuropathy:   He reports numbness on the bottom of his feet, mostly his toes.  He also has a discomfort involving the left shin, but it is not a pain.  He says she sometimes catches his toe of his left foot on the ground and may stumble.  This has been ongoing since his stroke.  He also reports having fractured his left ankle twice.  He does report crossing his legs.  He notes muscle cramps in the legs, which bother him at night.  NCV-EMG from 04/28/16 revealed chronic symmetric sensorimotor polyneuropathy.  Neuropathy labs include:  ANA negative, Sed Rate 3, B6 43.8, SPEP/IFE/UPEP negative  PAST MEDICAL HISTORY: Past Medical History:  Diagnosis  Date  . Allergy    seasonal- mostly spring  . Anemia 02/03/2011  . ASTHMA, CHILDHOOD 03/17/2010   mostly as child  . BCC (basal cell carcinoma) 12/08/2011  . CAROTID ARTERY OCCLUSION, WITH INFARCTION 03/17/2010  . CEREBROVASCULAR ACCIDENT 03/17/2010   partial loss of peripheral vision on left-"slight improved"  . CHICKENPOX, HX OF 03/17/2010  . Constipation 12/08/2014  . Epistaxis 09/06/2013  . FATIGUE 03/17/2010  . Hearing loss  11/04/2010   bilateral  . HYPERKALEMIA 05/27/2010  . Hyperlipidemia   . Hypertension   . Hypothyroid 12/08/2011  . Mixed hyperlipidemia 03/17/2010  . Oral lesion 11/20/2014  . Overweight(278.02) 07/01/2010  . Peripheral neuropathy 08/18/2016  . Personal history of other infectious and parasitic disease 03/17/2010  . Preventative health care 08/23/2016  . Skin lesion of right arm 11/20/2014  . Sleep apnea    uses mouthpiece only  . Thrombocytopenia ([place]) 04/05/2012  . TOBACCO ABUSE, HX OF 03/17/2010  . Tubular adenoma of colon 06/10/2011    MEDICATIONS: Current Outpatient Prescriptions on File Prior to Visit  Medication Sig Dispense Refill  . amLODipine (NORVASC) 2.5 MG tablet Take 1 tablet (2.5 mg total) by mouth daily. 90 tablet 2  . aspirin EC 81 MG tablet Take 81 mg by mouth daily.    [name] b complex vitamins tablet Take 1 tablet by mouth daily.    . chlorthalidone (HYGROTON) 25 MG tablet Take 1 tablet (25 mg total) by mouth every morning. 90 tablet 2  . Cholecalciferol (VITAMIN D PO) Take 2,000 mg by mouth daily.     . clopidogrel (PLAVIX) 75 MG tablet TAKE 1 TABLET BY MOUTH  DAILY WITH BREAKFAST 90 tablet 1  . Coenzyme Q10 (CO Q-10) 300 MG CAPS Take 1 capsule by mouth daily.    . Glucosamine-Chondroit-Vit C-Mn (GLUCOSAMINE 1500 COMPLEX PO) Take 1,500 mg by mouth daily.     [name] levothyroxine (SYNTHROID, LEVOTHROID) 75 MCG tablet TAKE 1 TABLET BY MOUTH  DAILY 90 tablet 0  . linaclotide (LINZESS) 72 MCG capsule Take 1 capsule (72 mcg total) by mouth daily before breakfast. 30 capsule 2  . magnesium oxide (MAG-OX) 400 MG tablet Take 400 mg by mouth daily.    [name] MEGARED OMEGA-3 KRILL OIL 500 MG CAPS Take 1,000 mg by mouth.    . pantoprazole (PROTONIX) 40 MG tablet TAKE 1 TABLET BY MOUTH  DAILY 90 tablet 1  . Probiotic Product (PROBIOTIC PO) Take 1 capsule by mouth daily.     . ramipril (ALTACE) 10 MG capsule Take 1 capsule (10 mg total) by mouth 2 (two) times daily. 180 capsule 2  . rosuvastatin  (CRESTOR) 40 MG tablet Take 1 tablet (40 mg total) by mouth every evening. 90 tablet 1  . Turmeric (CURCUMIN 95) 500 MG CAPS Take 500 mg by mouth.     No current facility-administered medications on file prior to visit.     ALLERGIES: No Known Allergies  FAMILY HISTORY: Family History  Problem Relation Age of Onset  . Alzheimer's disease Mother   . Emphysema Sister        cigarettes  . Cancer Sister        lung, tobacco   . Heart disease Son   . Coronary artery disease Brother   . Heart attack Brother   . Other Brother        heart problems- from fathers side  . Cancer Brother 5       lung? smoker  . Heart disease Brother 48  Heart disease before age 51  . Cancer Sister        gyn  . Heart disease Sister   . Bipolar disorder Daughter   . Obesity Daughter   . Stroke Father   . Heart disease Sister     SOCIAL HISTORY: Social History   Social History  . Marital status: Divorced    Spouse name: N/A  . Number of children: 3  . Years of education: 76yrs   Occupational History  . retired Retired   Social History Main Topics  . Smoking status: Former Smoker    Packs/day: 2.00    Years: 20.00    Types: Cigarettes    Quit date: 07/19/1977  . Smokeless tobacco: Never Used  . Alcohol use 4.2 oz/week    7 Glasses of wine per week     Comment: dinner wine daily  . Drug use: No  . Sexual activity: No   Other Topics Concern  . Not on file   Social History Narrative   Pt lives at home alone.    Caffeine Use: very little   Heart healthy diet, is planning exercise      Retired from Event organiser    REVIEW OF SYSTEMS: Constitutional: No fevers, chills, or sweats, no generalized fatigue, change in appetite Eyes: No visual changes, double vision, eye pain Ear, nose and throat: No hearing loss, ear pain, nasal congestion, sore throat Cardiovascular: No chest pain, palpitations Respiratory:  No shortness of breath at rest or with exertion,  wheezes GastrointestinaI: No nausea, vomiting, diarrhea, abdominal pain, fecal incontinence Genitourinary:  No dysuria, urinary retention or frequency Musculoskeletal:  No neck pain, back pain Integumentary: No rash, pruritus, skin lesions Neurological: as above Psychiatric: No depression, insomnia, anxiety Endocrine: No palpitations, fatigue, diaphoresis, mood swings, change in appetite, change in weight, increased thirst Hematologic/Lymphatic:  No purpura, petechiae. Allergic/Immunologic: no itchy/runny eyes, nasal congestion, recent allergic reactions, rashes  PHYSICAL EXAM: Vitals:   04/27/17 0905  BP: (!) 144/70  Pulse: 70  SpO2: 98%   General: No acute distress.  Patient appears well-groomed.  normal body habitus. Head:  Normocephalic/atraumatic Eyes:  Fundi examined but not visualized Neck: supple, no paraspinal tenderness, full range of motion Carotids:  Right carotid bruit. Heart:  Regular rate and rhythm Lungs:  Clear to auscultation bilaterally Back: No paraspinal tenderness Neurological Exam: alert and oriented to person, place, and time. Attention span and concentration intact, recent and remote memory intact, fund of knowledge intact.  Speech fluent and not dysarthric, language intact.  Left homonymous hemianopsia.  Otherwise, CN II-XII intact. Bulk and tone normal, muscle strength 5/5 throughout.  Sensation to vibration reduced in toes.  Deep tendon reflexes 2+ throughout, toes downgoing.  Finger to nose and heel to shin testing intact.  Gait normal, Romberg negative.  IMPRESSION: 1.  Right MCA/PCA watershed infarct, secondary to right ICA occlusion and right PCA occlusion. 2.  Asymptomatic left ICA disease status post left CEA, stable 3.  Hyperlipidemia 4.  Hypertension 5.  Idiopathic polyneuropathy  PLAN: 1.  At this time, he doesn't feel he needs any medication for treatment of nerve pain or discomfort. 2.  ASA and Plavix for secondary stroke prevention (as  managed by PCP) 3.  Statin therapy as managed by PCP (LDL goal less than 70) 4.  Recheck and optimize blood pressure control with Dr. [name]. 5.  Mediterranean diet 6.  B6 was elevated, but not significant enough that would likely contribute to neuropathy.  However, he  may want to discontinue B6 supplement (which is included in his B-complex vitamin) to see if it makes any difference. 7.  Monitor carotid stenosis with vascular surgery 8.  Pain in thumbs may be arthritis.  Advised to discuss with Dr. [name].  25 minutes spent face to face with patient, over 50% spent discussing management.  Tanner Clines, DO  CC: Penni Homans, MD

## 2017-04-28 ENCOUNTER — Other Ambulatory Visit: Payer: Self-pay | Admitting: Family Medicine

## 2017-05-12 ENCOUNTER — Ambulatory Visit (HOSPITAL_COMMUNITY)
Admission: EM | Admit: 2017-05-12 | Discharge: 2017-05-12 | Disposition: A | Discharge status: Home / Self Care | Payer: Medicare Other | Attending: Family Medicine | Admitting: Family Medicine

## 2017-05-12 ENCOUNTER — Encounter (HOSPITAL_COMMUNITY): Payer: Self-pay | Admitting: Family Medicine

## 2017-05-12 ENCOUNTER — Ambulatory Visit (INDEPENDENT_AMBULATORY_CARE_PROVIDER_SITE_OTHER): Payer: Medicare Other

## 2017-05-12 DIAGNOSIS — M25571 Pain in right ankle and joints of right foot: Secondary | ICD-10-CM | POA: Diagnosis not present

## 2017-05-12 DIAGNOSIS — S93491A Sprain of other ligament of right ankle, initial encounter: Secondary | ICD-10-CM | POA: Diagnosis not present

## 2017-05-12 NOTE — ED Triage Notes (Signed)
Pt here for right ankle injury with swelling and pain after twisting it this am.

## 2017-05-13 NOTE — ED Provider Notes (Signed)
Tanner [name]   [id number] 05/12/17 Arrival Time: 8676  ASSESSMENT & PLAN:  1. Sprain of anterior talofibular ligament of right ankle, initial encounter    Radiologist reports: Crescent-shaped calcification between the lateral malleolus and the lateral talus may reflect age indeterminate avulsion. Will place in ASO and treat as sprain. He will plan f/u with PCP within the next week if not improving. OTC analgesics as needed. May f/u here as needed.  Reviewed expectations re: course of current medical issues. Questions answered. Outlined signs and symptoms indicating need for more acute intervention. Patient verbalized understanding. After Visit Summary given.   SUBJECTIVE:  Tanner [name] is a 82 y.o. male who reports pain of his R ankle.Onset of the symptoms was today. Inciting event: "twisted this morning." Current symptoms include ability to bear weight, but with some pain.  Aggravating symptoms: any weight bearing. Patient's overall course: stable. Patient has had no prior ankle problems. Previous visits for this problem: none.  Evaluation to date: none.  Treatment to date: none. No extremity sensation changes or weakness.   ROS: As per HPI.   OBJECTIVE:  Vitals:   05/12/17 1342  BP: (!) 170/81  Pulse: 60  Resp: 18  Temp: 97.9 F (36.6 C)  SpO2: 100%    General appearance: alert; no distress Extremities: no cyanosis or edema; symmetrical with no gross deformities; tenderness over his right lateral ankle with no swelling and no bruising; FROM CV: normal extremity capillary refill Skin: warm and dry Neurologic: normal gait; normal symmetric reflexes in all extremities; normal sensation Psychological: alert and cooperative; normal mood and affect  Imaging: Dg Ankle Complete Right  Result Date: 05/12/2017 CLINICAL DATA:  Ankle injury, pain at the lateral malleolus EXAM: RIGHT ANKLE - COMPLETE 3+ VIEW COMPARISON:  None. FINDINGS: Lateral soft tissue  swelling. No dislocation. Mortise is symmetric. Crescent-shaped calcification between the lateral malleolus and the lateral talus, may represent age indeterminate avulsion. Degenerative changes at the medial ankle. Small plantar and posterior calcaneal bone spurs. Vascular calcifications. IMPRESSION: 1. Crescent-shaped calcification between the lateral malleolus and the lateral talus may reflect age indeterminate avulsion. Otherwise no acute osseous abnormality 2. Small plantar calcaneal spur. Electronically Signed   By: Donavan Foil M.D.   On: 05/12/2017 14:12    No Known Allergies  Past Medical History:  Diagnosis Date  . Allergy    seasonal- mostly spring  . Anemia 02/03/2011  . ASTHMA, CHILDHOOD 03/17/2010   mostly as child  . BCC (basal cell carcinoma) 12/08/2011  . CAROTID ARTERY OCCLUSION, WITH INFARCTION 03/17/2010  . CEREBROVASCULAR ACCIDENT 03/17/2010   partial loss of peripheral vision on left-"slight improved"  . CHICKENPOX, HX OF 03/17/2010  . Constipation 12/08/2014  . Epistaxis 09/06/2013  . FATIGUE 03/17/2010  . Hearing loss 11/04/2010   bilateral  . HYPERKALEMIA 05/27/2010  . Hyperlipidemia   . Hypertension   . Hypothyroid 12/08/2011  . Mixed hyperlipidemia 03/17/2010  . Oral lesion 11/20/2014  . Overweight(278.02) 07/01/2010  . Peripheral neuropathy 08/18/2016  . Personal history of other infectious and parasitic disease 03/17/2010  . Preventative health care 08/23/2016  . Skin lesion of right arm 11/20/2014  . Sleep apnea    uses mouthpiece only  . Thrombocytopenia ([place]) 04/05/2012  . TOBACCO ABUSE, HX OF 03/17/2010  . Tubular adenoma of colon 06/10/2011   Social History   Social History  . Marital status: Divorced    Spouse name: N/A  . Number of children: 3  . Years of education: 31yrs  Occupational History  . retired Retired   Social History Main Topics  . Smoking status: Former Smoker    Packs/day: 2.00    Years: 20.00    Types: Cigarettes    Quit date:  07/19/1977  . Smokeless tobacco: Never Used  . Alcohol use 4.2 oz/week    7 Glasses of wine per week     Comment: dinner wine daily  . Drug use: No  . Sexual activity: No   Other Topics Concern  . Not on file   Social History Narrative   Pt lives at home alone.    Caffeine Use: very little   Heart healthy diet, is planning exercise      Retired from Event organiser   Family History  Problem Relation Age of Onset  . Alzheimer's disease Mother   . Emphysema Sister        cigarettes  . Cancer Sister        lung, tobacco   . Heart disease Son   . Coronary artery disease Brother   . Heart attack Brother   . Other Brother        heart problems- from fathers side  . Cancer Brother 63       lung? smoker  . Heart disease Brother 23       Heart disease before age 73  . Cancer Sister        gyn  . Heart disease Sister   . Bipolar disorder Daughter   . Obesity Daughter   . Stroke Father   . Heart disease Sister    Past Surgical History:  Procedure Laterality Date  . CAROTID ENDARTERECTOMY Left February 13, 2013   cea  . CATARACT EXTRACTION, BILATERAL Right    Cataract, and stigmatism  . COLONOSCOPY  Dec. 2014  . ENDARTERECTOMY Left 02/13/2013   Procedure: ENDARTERECTOMY CAROTID;  Surgeon: Rosetta Posner, MD;  Location: [facility];  Service: Vascular;  Laterality: Left;  . EUS N/A 08/17/2013   Procedure: UPPER ENDOSCOPIC ULTRASOUND (EUS) LINEAR;  Surgeon: Milus Banister, MD;  Location: [facility];  Service: Endoscopy;  Laterality: N/A;  . knee cartliage repair  1964, 1990   bilateral  . [place]   removed  . ROTATOR CUFF REPAIR     right  . TONSILLECTOMY    . UPPER GI ENDOSCOPY  Jan. 2015     Vanessa Kick, MD 05/13/17 1021

## 2017-06-16 NOTE — Addendum Note (Signed)
Addended by: Lianne Cure A on: 06/16/2017 01:02 PM   Modules accepted: Orders

## 2017-06-22 ENCOUNTER — Telehealth: Payer: Self-pay | Admitting: Internal Medicine

## 2017-06-22 MED ORDER — LINACLOTIDE 72 MCG PO CAPS: 72.0000 ug | ORAL_CAPSULE | Freq: Every day | ORAL | 0 refills | Status: DC

## 2017-06-22 NOTE — Telephone Encounter (Signed)
Rx sent for Linzess 90 day supply. Patient has scheduled appointment for follow up with Dr Hilarie Fredrickson on 08/19/17 at 9:30 am.

## 2017-07-07 ENCOUNTER — Encounter: Payer: Self-pay | Admitting: Family Medicine

## 2017-07-07 MED ORDER — AMLODIPINE BESYLATE 2.5 MG PO TABS: 2.5000 mg | ORAL_TABLET | Freq: Two times a day (BID) | ORAL | 1 refills | Status: DC

## 2017-08-10 ENCOUNTER — Other Ambulatory Visit: Payer: Self-pay | Admitting: Internal Medicine

## 2017-08-17 ENCOUNTER — Other Ambulatory Visit (INDEPENDENT_AMBULATORY_CARE_PROVIDER_SITE_OTHER): Payer: Medicare Other

## 2017-08-17 DIAGNOSIS — E785 Hyperlipidemia, unspecified: Secondary | ICD-10-CM | POA: Diagnosis not present

## 2017-08-17 DIAGNOSIS — E1065 Type 1 diabetes mellitus with hyperglycemia: Secondary | ICD-10-CM | POA: Diagnosis not present

## 2017-08-17 DIAGNOSIS — I10 Essential (primary) hypertension: Secondary | ICD-10-CM | POA: Diagnosis not present

## 2017-08-17 LAB — COMPREHENSIVE METABOLIC PANEL
ALT: 18 U/L (ref 0–53)
AST: 17 U/L (ref 0–37)
Albumin: 4.6 g/dL (ref 3.5–5.2)
Alkaline Phosphatase: 55 U/L (ref 39–117)
BUN: 27 mg/dL — ABNORMAL HIGH (ref 6–23)
CALCIUM: 9.9 mg/dL (ref 8.4–10.5)
CHLORIDE: 102 meq/L (ref 96–112)
CO2: 31 meq/L (ref 19–32)
CREATININE: 1.19 mg/dL (ref 0.40–1.50)
GFR: 62.43 mL/min (ref >60.00)
GLUCOSE: 105 mg/dL — AB (ref 70–99)
Potassium: 4.9 mEq/L (ref 3.5–5.1)
Sodium: 141 mEq/L (ref 135–145)
Total Bilirubin: 0.7 mg/dL (ref 0.2–1.2)
Total Protein: 7.2 g/dL (ref 6.0–8.3)

## 2017-08-17 LAB — CBC
HEMATOCRIT: 44.6 % (ref 39.0–52.0)
Hemoglobin: 14.8 g/dL (ref 13.0–17.0)
MCHC: 33.3 g/dL (ref 30.0–36.0)
MCV: 94 fl (ref 78.0–100.0)
RBC: 4.74 Mil/uL (ref 4.22–5.81)
RDW: 14.8 % (ref 11.5–15.5)
WBC: 5.6 10*3/uL (ref 4.0–10.5)

## 2017-08-17 LAB — LIPID PANEL
CHOL/HDL RATIO: 3
Cholesterol: 173 mg/dL (ref 0–200)
HDL: 53.4 mg/dL (ref >39.00)
LDL CALC: 100 mg/dL — AB (ref 0–99)
NONHDL: 119.15
TRIGLYCERIDES: 94 mg/dL (ref 0.0–149.0)
VLDL: 18.8 mg/dL (ref 0.0–40.0)

## 2017-08-17 LAB — HEMOGLOBIN A1C: Hgb A1c MFr Bld: 5.9 % (ref 4.6–6.5)

## 2017-08-17 LAB — TSH: TSH: 5.14 u[IU]/mL — ABNORMAL HIGH (ref 0.35–4.50)

## 2017-08-19 ENCOUNTER — Ambulatory Visit: Payer: Medicare Other | Admitting: Internal Medicine

## 2017-08-19 ENCOUNTER — Telehealth: Payer: Self-pay | Admitting: *Deleted

## 2017-08-19 ENCOUNTER — Encounter: Payer: Self-pay | Admitting: Internal Medicine

## 2017-08-19 VITALS — BP 140/72 | HR 64 | Ht 72.5 in | Wt 211.5 lb

## 2017-08-19 DIAGNOSIS — K5730 Diverticulosis of large intestine without perforation or abscess without bleeding: Secondary | ICD-10-CM

## 2017-08-19 DIAGNOSIS — K5900 Constipation, unspecified: Secondary | ICD-10-CM

## 2017-08-19 DIAGNOSIS — Z86010 Personal history of colonic polyps: Secondary | ICD-10-CM

## 2017-08-19 DIAGNOSIS — R194 Change in bowel habit: Secondary | ICD-10-CM | POA: Diagnosis not present

## 2017-08-19 DIAGNOSIS — Z7902 Long term (current) use of antithrombotics/antiplatelets: Secondary | ICD-10-CM | POA: Diagnosis not present

## 2017-08-19 MED ORDER — SUPREP BOWEL PREP KIT 17.5-3.13-1.6 GM/177ML PO SOLN: 1.0000 | ORAL | 0 refills | Status: DC

## 2017-08-19 NOTE — Telephone Encounter (Signed)
OK to hold patient Plavix for 5 days prior to colonoscopy as requested then restart after procedure

## 2017-08-19 NOTE — Patient Instructions (Addendum)
Increase your Linzess to 290 mcg once daily. We have given you samples of Linzess 145 mcg to add to the 145 mcg capsules you already have. If you find that the 290 mcg works well for you, let us know and we will send a prescription.  You have been scheduled for a colonoscopy. Please follow written instructions given to you at your visit today.  Please pick up your prep supplies at the pharmacy within the next 1-3 days. If you use inhalers (even only as needed), please bring them with you on the day of your procedure. Your physician has requested that you go to www.startemmi.com and enter the access code given to you at your visit today. This web site gives a general overview about your procedure. However, you should still follow specific instructions given to you by our office regarding your preparation for the procedure.  If you are age 22 or older, your body mass index should be between 23-30. Your Body mass index is 28.29 kg/m. If this is out of the aforementioned range listed, please consider follow up with your Primary Care Provider.  If you are age 66 or younger, your body mass index should be between 19-25. Your Body mass index is 28.29 kg/m. If this is out of the aformentioned range listed, please consider follow up with your Primary Care Provider.

## 2017-08-19 NOTE — Telephone Encounter (Signed)
I have spoken to patient to advise that Dr [name] has given the okay for him to hold plavix 5 days prior to his colonoscopy procedure and restart after procedure. Patient verbalizes understanding.

## 2017-08-19 NOTE — Progress Notes (Signed)
Subjective:    Patient ID: Tanner [name], male    DOB: [birth year], 83 y.o.   MRN: [id number]  HPI Tanner [name] is an 83 year old male with a history of adenomatous colon polyps, left-sided colonic diverticulosis, constipation, hypertension, hyperlipidemia and hypothyroidism who is here for follow-up.  I saw him on 04/19/2017 to discuss constipation symptoms and incomplete evacuation.  After that visit we started him on Linzess 72 mcg daily and stopped his MiraLAX and Benefiber.  He reports that symptoms have definitively improved but he still has some issues with incomplete evacuation.  There are times when he feels the urge to go to the bathroom but cannot.  He is having at least a small bowel movement daily but it can be every 2-3 days before he has complete evacuation.  There are still times when he will have urgency with a little bit of formed stool followed by loose explosive stool.  His bloating is definitively better.  He has not seen blood in his stool but he notes his stools have been at times very dark.  No abdominal pain.  No nausea or vomiting.  He continues Plavix.  He also takes pantoprazole 40 mg daily.  He reports that he would "just feel better" with repeat colonoscopy because he is concerned that something with his bowel is "not right".  Last colonoscopy was 2015 revealing a normal terminal ileum and to, 3-5 mm polyps from the ascending colon which were removed.  1 of the polyps was adenomatous.  There was moderate left-sided diverticulosis.  Review of Systems As per HPI, otherwise negative  Current Medications, Allergies, Past Medical History, Past Surgical History, Family History and Social History were reviewed in [employer] record.      Objective:   Physical Exam BP 140/72 (BP Location: Left Arm, Patient Position: Sitting, Cuff Size: Normal)   Pulse 64   Ht 6' 0.5" (1.842 m)   Wt 211 lb 8 oz (95.9 kg)   BMI 28.29 kg/m  Constitutional:  Well-developed and well-nourished. No distress. HEENT: Normocephalic and atraumatic.  No scleral icterus. Neck: Neck supple. Trachea midline. Cardiovascular: Normal rate, regular rhythm and intact distal pulses.  Pulmonary/chest: Effort normal and breath sounds normal. No wheezing, rales or rhonchi. Abdominal: Soft, nontender, nondistended. Bowel sounds active throughout.  Extremities: no clubbing, cyanosis, or edema Neurological: Alert and oriented to person place and time. Skin: Skin is warm and dry. Psychiatric: Normal mood and affect. Behavior is normal.  CBC    Component Value Date/Time   WBC 5.6 08/17/2017 0808   RBC 4.74 08/17/2017 0808   HGB 14.8 08/17/2017 0808   HCT 44.6 08/17/2017 0808   PLT 181.0 Repeated and verified X2. 08/17/2017 0808   MCV 94.0 08/17/2017 0808   MCH 31.1 08/14/2013 1532   MCHC 33.3 08/17/2017 0808   RDW 14.8 08/17/2017 0808   LYMPHSABS 1.8 02/09/2017 0809   MONOABS 0.5 02/09/2017 0809   EOSABS 0.4 02/09/2017 0809   BASOSABS 0.1 02/09/2017 0809   CMP     Component Value Date/Time   NA 141 08/17/2017 0808   K 4.9 08/17/2017 0808   CL 102 08/17/2017 0808   CO2 31 08/17/2017 0808   GLUCOSE 105 (H) 08/17/2017 0808   BUN 27 (H) 08/17/2017 0808   CREATININE 1.19 08/17/2017 0808   CREATININE 1.03 08/14/2013 1532   CALCIUM 9.9 08/17/2017 0808   PROT 7.2 08/17/2017 0808   ALBUMIN 4.6 08/17/2017 0808   AST 17 08/17/2017 2637  ALT 18 08/17/2017 0808   ALKPHOS 55 08/17/2017 0808   BILITOT 0.7 08/17/2017 0808   GFRNONAA 79 (L) 02/14/2013 0530   GFRAA >90 02/14/2013 0530        Assessment & Plan:  83 year old male with a history of adenomatous colon polyps, left-sided colonic diverticulosis, constipation, hypertension, hyperlipidemia and hypothyroidism who is here for follow-up.   1.  Constipation, incomplete defecation/change in bowel habits --he is concerned that there is other or additional colonic pathology and has requested colonoscopy.   We discussed the risk, benefits and alternatives and he is agreeable and wishes to proceed.  I think this is reasonable to evaluate his symptoms and at a minimum will provide reassurance.  My suspicion is that he is having some symptomatic diverticulosis leading to his bowel habit change.  I am going to try him on increased Linzess at 145 mcg daily and asked that he notify me if he develops diarrhea with the new dose.  Hold Plavix 5 days before procedure - will instruct when and how to resume after procedure. Risks and benefits of procedure including bleeding, perforation, infection, missed lesions, medication reactions and possible hospitalization or surgery if complications occur explained. Additional rare but real risk of cardiovascular event such as heart attack or ischemia/infarct of other organs off Plavix explained and need to seek urgent help if this occurs. Will communicate by phone or EMR with patient's prescribing provider Dr. [name] that to confirm holding Plavix is reasonable in this case.   25 minutes spent with the patient today. Greater than 50% was spent in counseling and coordination of care with the patient

## 2017-08-19 NOTE — Telephone Encounter (Signed)
   Tanner [name] Jun 29, 1935 [id number]  Dear Dr [name]:  We have scheduled the above named patient for a(n) colonoscopy procedure. Our records show that (s)he is on anticoagulation therapy.  Please advise as to whether the patient may come off their therapy of Plavix  5 days prior to their procedure which is scheduled for 11/01/17.  Please route your response to Dixon Boos, [place] or fax response to [phone].  Sincerely,   [facility]

## 2017-08-24 ENCOUNTER — Encounter: Payer: Medicare Other | Admitting: Family Medicine

## 2017-08-27 ENCOUNTER — Encounter: Payer: Self-pay | Admitting: Internal Medicine

## 2017-08-27 ENCOUNTER — Encounter: Payer: Self-pay | Admitting: Family Medicine

## 2017-08-27 ENCOUNTER — Other Ambulatory Visit: Payer: Self-pay

## 2017-08-27 MED ORDER — LINACLOTIDE 145 MCG PO CAPS: 145.0000 ug | ORAL_CAPSULE | Freq: Every day | ORAL | 0 refills | Status: DC

## 2017-08-27 NOTE — Progress Notes (Signed)
.  lin

## 2017-08-30 ENCOUNTER — Other Ambulatory Visit: Payer: Self-pay | Admitting: Family Medicine

## 2017-08-30 MED ORDER — AMLODIPINE BESYLATE 2.5 MG PO TABS: 2.5000 mg | ORAL_TABLET | Freq: Two times a day (BID) | ORAL | 1 refills | Status: DC

## 2017-08-30 NOTE — Telephone Encounter (Signed)
Dr [name]-- please see pt message and advise?

## 2017-10-05 ENCOUNTER — Ambulatory Visit (INDEPENDENT_AMBULATORY_CARE_PROVIDER_SITE_OTHER): Payer: Medicare Other | Admitting: Family Medicine

## 2017-10-05 ENCOUNTER — Encounter: Payer: Self-pay | Admitting: Family Medicine

## 2017-10-05 VITALS — BP 138/68 | HR 59 | Temp 97.4°F | Resp 18 | Ht 74.0 in | Wt 214.6 lb

## 2017-10-05 DIAGNOSIS — E039 Hypothyroidism, unspecified: Secondary | ICD-10-CM

## 2017-10-05 DIAGNOSIS — D696 Thrombocytopenia, unspecified: Secondary | ICD-10-CM

## 2017-10-05 DIAGNOSIS — E663 Overweight: Secondary | ICD-10-CM

## 2017-10-05 DIAGNOSIS — R739 Hyperglycemia, unspecified: Secondary | ICD-10-CM | POA: Diagnosis not present

## 2017-10-05 DIAGNOSIS — D126 Benign neoplasm of colon, unspecified: Secondary | ICD-10-CM

## 2017-10-05 DIAGNOSIS — I10 Essential (primary) hypertension: Secondary | ICD-10-CM | POA: Diagnosis not present

## 2017-10-05 DIAGNOSIS — K5900 Constipation, unspecified: Secondary | ICD-10-CM | POA: Diagnosis not present

## 2017-10-05 DIAGNOSIS — R609 Edema, unspecified: Secondary | ICD-10-CM

## 2017-10-05 DIAGNOSIS — E782 Mixed hyperlipidemia: Secondary | ICD-10-CM | POA: Diagnosis not present

## 2017-10-05 DIAGNOSIS — Z0000 Encounter for general adult medical examination without abnormal findings: Secondary | ICD-10-CM

## 2017-10-05 LAB — TSH: TSH: 4.77 u[IU]/mL — AB (ref 0.35–4.50)

## 2017-10-05 LAB — T4, FREE: FREE T4: 0.79 ng/dL (ref 0.60–1.60)

## 2017-10-05 MED ORDER — AMLODIPINE BESYLATE 2.5 MG PO TABS: 2.5000 mg | ORAL_TABLET | Freq: Three times a day (TID) | ORAL | 1 refills | Status: DC

## 2017-10-05 NOTE — Progress Notes (Signed)
Subjective:  I acted as a Education administrator for Dr. [name]. [place], [place]  Patient ID: Tanner [name], male    DOB: [birth year], 83 y.o.   MRN: [id number]  No chief complaint on file.   HPI  Patient is in today for an annual exam and follow up on chronic medical concerns including hyperlipidemia, hypertension and cerebrovascular disease. He feels well. No recent febrile illnes or hospitalizations. He notes his blood pressure has been running slightly high recently. Is not exercising regularly but tries to maintain a heart healthy diet most days. He is doing well with activities of daily living and denies any difficulties at home.Denies CP/palp/SOB/HA/congestion/fevers/GI or GU c/o. Taking meds as prescribed  Patient Care Team: [name], MD as PCP - Loman Brooklyn, Stephan Minister, DO as Consulting Physician (Neurology) Early, Arvilla Meres, MD as Consulting Physician (Vascular Surgery) Druscilla Brownie, MD as Consulting Physician (Dermatology) Pyrtle, Lajuan Lines, MD as Consulting Physician (Gastroenterology)   Past Medical History:  Diagnosis Date  . Allergy    seasonal- mostly spring  . Anemia 02/03/2011  . ASTHMA, CHILDHOOD 03/17/2010   mostly as child  . BCC (basal cell carcinoma) 12/08/2011  . CAROTID ARTERY OCCLUSION, WITH INFARCTION 03/17/2010  . CEREBROVASCULAR ACCIDENT 03/17/2010   partial loss of peripheral vision on left-"slight improved"  . CHICKENPOX, HX OF 03/17/2010  . Constipation 12/08/2014  . Epistaxis 09/06/2013  . FATIGUE 03/17/2010  . Hearing loss 11/04/2010   bilateral  . HYPERKALEMIA 05/27/2010  . Hyperlipidemia   . Hypertension   . Hypothyroid 12/08/2011  . Mixed hyperlipidemia 03/17/2010  . Oral lesion 11/20/2014  . Overweight(278.02) 07/01/2010  . Peripheral neuropathy 08/18/2016  . Personal history of other infectious and parasitic disease 03/17/2010  . Preventative health care 08/23/2016  . Skin lesion of right arm 11/20/2014  . Sleep apnea    uses mouthpiece only  . Thrombocytopenia  ([place]) 04/05/2012  . TOBACCO ABUSE, HX OF 03/17/2010  . Tubular adenoma of colon 06/10/2011    Past Surgical History:  Procedure Laterality Date  . CAROTID ENDARTERECTOMY Left February 13, 2013   cea  . CATARACT EXTRACTION, BILATERAL Right    Cataract, and stigmatism  . COLONOSCOPY  Dec. 2014  . ENDARTERECTOMY Left 02/13/2013   Procedure: ENDARTERECTOMY CAROTID;  Surgeon: Rosetta Posner, MD;  Location: [facility];  Service: Vascular;  Laterality: Left;  . EUS N/A 08/17/2013   Procedure: UPPER ENDOSCOPIC ULTRASOUND (EUS) LINEAR;  Surgeon: Milus Banister, MD;  Location: [facility];  Service: Endoscopy;  Laterality: N/A;  . knee cartliage repair  1964, 1990   bilateral  . [employer]   removed  . ROTATOR CUFF REPAIR     right  . TONSILLECTOMY    . UPPER GI ENDOSCOPY  Jan. 2015    Family History  Problem Relation Age of Onset  . Alzheimer's disease Mother   . Emphysema Sister        cigarettes  . Cancer Sister        lung, tobacco   . Heart disease Son   . Coronary artery disease Brother   . Heart attack Brother   . Other Brother        heart problems- from fathers side  . Cancer Brother 58       lung? smoker  . Heart disease Brother 71       Heart disease before age 87  . Cancer Sister        gyn  .  Heart disease Sister   . Bipolar disorder Daughter   . Obesity Daughter   . Stroke Father   . Heart disease Sister     Social History   Socioeconomic History  . Marital status: Divorced    Spouse name: Not on file  . Number of children: 3  . Years of education: 69yr  . Highest education level: Not on file  Social Needs  . Financial resource strain: Not on file  . Food insecurity - worry: Not on file  . Food insecurity - inability: Not on file  . Transportation needs - medical: Not on file  . Transportation needs - non-medical: Not on file  Occupational History  . Occupation: retired    [employer] farm manager RETIRED  Tobacco Use  . Smoking status: Former Smoker      Packs/day: 2.00    Years: 20.00    Pack years: 40.00    Types: Cigarettes    Last attempt to quit: 07/19/1977    Years since quitting: 40.2  . Smokeless tobacco: Never Used  Substance and Sexual Activity  . Alcohol use: Yes    Alcohol/week: 4.2 oz    Types: 7 Glasses of wine per week    Comment: dinner wine daily  . Drug use: No  . Sexual activity: No  Other Topics Concern  . Not on file  Social History Narrative   Pt lives at home alone.    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 aspirin EC 81 MG tablet Take 81 mg by mouth daily.    .Marland Kitchenb complex vitamins tablet Take 1 tablet by mouth daily.    . chlorthalidone (HYGROTON) 25 MG tablet Take 1 tablet (25 mg total) by mouth every morning. 90 tablet 2  . Cholecalciferol (VITAMIN D PO) Take 2,000 mg by mouth daily.     . clopidogrel (PLAVIX) 75 MG tablet TAKE 1 TABLET BY MOUTH  DAILY WITH BREAKFAST 90 tablet 1  . Coenzyme Q10 (CO Q-10) 300 MG CAPS Take 1 capsule by mouth daily.    . Glucosamine-Chondroit-Vit C-Mn (GLUCOSAMINE 1500 COMPLEX PO) Take 1,500 mg by mouth daily.     .Marland Kitchenlevothyroxine (SYNTHROID, LEVOTHROID) 75 MCG tablet TAKE 1 TABLET BY MOUTH  DAILY 90 tablet 1  . linaclotide (LINZESS) 145 MCG CAPS capsule Take 1 capsule (145 mcg total) by mouth daily before breakfast. 90 capsule 0  . magnesium oxide (MAG-OX) 400 MG tablet Take 400 mg by mouth daily.    .Marland KitchenMEGARED OMEGA-3 KRILL OIL 500 MG CAPS Take 1,000 mg by mouth.    . pantoprazole (PROTONIX) 40 MG tablet TAKE 1 TABLET BY MOUTH  DAILY 90 tablet 1  . Probiotic Product (PROBIOTIC PO) Take 1 capsule by mouth daily.     . ramipril (ALTACE) 10 MG capsule Take 1 capsule (10 mg total) by mouth 2 (two) times daily. 180 capsule 2  . rosuvastatin (CRESTOR) 40 MG tablet Take 1 tablet (40 mg total) by mouth every evening. 90 tablet 1  .  SUPREP BOWEL PREP KIT 17.5-3.13-1.6 GM/177ML SOLN Take 1 kit by mouth as directed. 354 mL 0  . Turmeric (CURCUMIN 95) 500 MG CAPS Take 500 mg by mouth.    .Marland KitchenamLODipine (NORVASC) 2.5 MG tablet Take 1 tablet (2.5 mg total) by mouth 2 (two) times daily. 180 tablet 1  .  linaclotide (LINZESS) 72 MCG capsule Take 1 capsule (72 mcg total) by mouth daily before breakfast. 90 capsule 0   No facility-administered medications prior to visit.     No Known Allergies  Review of Systems  Constitutional: Negative for chills, fever and malaise/fatigue.  HENT: Negative for congestion and hearing loss.   Eyes: Negative for discharge.  Respiratory: Negative for cough, sputum production and shortness of breath.   Cardiovascular: Negative for chest pain, palpitations and leg swelling.  Gastrointestinal: Negative for abdominal pain, blood in stool, constipation, diarrhea, heartburn, nausea and vomiting.  Genitourinary: Negative for dysuria, frequency, hematuria and urgency.  Musculoskeletal: Positive for joint pain. Negative for back pain, falls and myalgias.  Skin: Negative for rash.  Neurological: Negative for dizziness, sensory change, loss of consciousness, weakness and headaches.  Endo/Heme/Allergies: Negative for environmental allergies. Does not bruise/bleed easily.  Psychiatric/Behavioral: Negative for depression and suicidal ideas. The patient is not nervous/anxious and does not have insomnia.        Objective:    Physical Exam  Constitutional: He is oriented to person, place, and time. He appears well-developed and well-nourished. No distress.  HENT:  Head: Normocephalic and atraumatic.  Eyes: Conjunctivae are normal.  Neck: Neck supple. No thyromegaly present.  Cardiovascular: Normal rate and regular rhythm.  Murmur heard. Pulmonary/Chest: Effort normal and breath sounds normal. No respiratory distress. He has no wheezes.  Abdominal: Soft. Bowel sounds are normal. He exhibits no mass. There  is no tenderness.  Musculoskeletal: He exhibits no edema.  Lymphadenopathy:    He has no cervical adenopathy.  Neurological: He is alert and oriented to person, place, and time.  Skin: Skin is warm and dry.  Psychiatric: He has a normal mood and affect. His behavior is normal.    BP 138/68 (BP Location: Left Arm, Patient Position: Sitting, Cuff Size: Normal)   Pulse (!) 59   Temp (!) 97.4 F (36.3 C) (Oral)   Resp 18   Ht '6\' 2"'  (1.88 m)   Wt 214 lb 9.6 oz (97.3 kg)   SpO2 93%   BMI 27.55 kg/m  Wt Readings from Last 3 Encounters:  10/05/17 214 lb 9.6 oz (97.3 kg)  08/19/17 211 lb 8 oz (95.9 kg)  04/27/17 211 lb 3.2 oz (95.8 kg)   BP Readings from Last 3 Encounters:  10/05/17 138/68  08/19/17 140/72  05/12/17 (!) 170/81     Immunization History  Administered Date(s) Administered  . Influenza Split 04/05/2012  . Influenza Whole 04/19/2009, 05/06/2011  . Influenza, High Dose Seasonal PF 05/09/2013, 04/13/2016, 04/16/2017  . Influenza-Unspecified 03/29/2014, 05/07/2015  . Pneumococcal Conjugate-13 05/17/2014  . Pneumococcal Polysaccharide-23 07/21/2007  . Td 07/21/2003  . Tdap 06/10/2011  . Zoster 07/21/2007    Health Maintenance  Topic Date Due  . FOOT EXAM  01/17/1945  . OPHTHALMOLOGY EXAM  [birth year]17/1945  . HEMOGLOBIN A1C  02/14/2018  . COLONOSCOPY  08/03/2018  . TETANUS/TDAP  06/09/2021  . INFLUENZA VACCINE  Completed  . PNA vac Low Risk Adult  Completed    Lab Results  Component Value Date   WBC 5.6 08/17/2017   HGB 14.8 08/17/2017   HCT 44.6 08/17/2017   PLT 181.0 Repeated and verified X2. 08/17/2017   GLUCOSE 105 (H) 08/17/2017   CHOL 173 08/17/2017   TRIG 94.0 08/17/2017   HDL 53.40 08/17/2017   LDLCALC 100 (H) 08/17/2017   ALT 18 08/17/2017   AST 17 08/17/2017   NA 141 08/17/2017   K 4.9 08/17/2017  CL 102 08/17/2017   CREATININE 1.19 08/17/2017   BUN 27 (H) 08/17/2017   CO2 31 08/17/2017   TSH 5.14 (H) 08/17/2017   PSA 0.39 05/08/2014   INR  0.99 02/07/2013   HGBA1C 5.9 08/17/2017   MICROALBUR 2.0 (H) 12/03/2015    Lab Results  Component Value Date   TSH 5.14 (H) 08/17/2017   Lab Results  Component Value Date   WBC 5.6 08/17/2017   HGB 14.8 08/17/2017   HCT 44.6 08/17/2017   MCV 94.0 08/17/2017   PLT 181.0 Repeated and verified X2. 08/17/2017   Lab Results  Component Value Date   NA 141 08/17/2017   K 4.9 08/17/2017   CO2 31 08/17/2017   GLUCOSE 105 (H) 08/17/2017   BUN 27 (H) 08/17/2017   CREATININE 1.19 08/17/2017   BILITOT 0.7 08/17/2017   ALKPHOS 55 08/17/2017   AST 17 08/17/2017   ALT 18 08/17/2017   PROT 7.2 08/17/2017   ALBUMIN 4.6 08/17/2017   CALCIUM 9.9 08/17/2017   GFR 62.43 08/17/2017   Lab Results  Component Value Date   CHOL 173 08/17/2017   Lab Results  Component Value Date   HDL 53.40 08/17/2017   Lab Results  Component Value Date   LDLCALC 100 (H) 08/17/2017   Lab Results  Component Value Date   TRIG 94.0 08/17/2017   Lab Results  Component Value Date   CHOLHDL 3 08/17/2017   Lab Results  Component Value Date   HGBA1C 5.9 08/17/2017         Assessment & Plan:   Problem List Items Addressed This Visit    Hypothyroid (Chronic)    On Levothyroxine, continue to monitor      Relevant Orders   T4, free   TSH   T4, free   Mixed hyperlipidemia - Primary   Relevant Medications   amLODipine (NORVASC) 2.5 MG tablet   Other Relevant Orders   Lipid panel   Essential hypertension, benign    Running 778 to 242 systolic at home. Increase Amlodipine to 2.5 mg tid. Encouraged heart healthy diet such as the DASH diet and exercise as tolerated.       Relevant Medications   amLODipine (NORVASC) 2.5 MG tablet   Other Relevant Orders   CBC   Comprehensive metabolic panel   TSH   Overweight    Encouraged DASH diet, decrease po intake and increase exercise as tolerated. Needs 7-8 hours of sleep nightly. Avoid trans fats, eat small, frequent meals every 4-5 hours with lean  proteins, complex carbs and healthy fats. Minimize simple carbs.      Tubular adenoma of colon    Has repeat colonoscopy scheduled with Dr Hilarie Fredrickson next month      Thrombocytopenia ([place])    Check cbc      Constipation    Encouraged increased hydration and fiber in diet. Daily probiotics. If bowels not moving can use MOM 2 tbls po in 4 oz of warm prune juice by mouth every 2-3 days. If no results then repeat in 4 hours with  Dulcolax suppository pr, may repeat again in 4 more hours as needed. Seek care if symptoms worsen. Consider daily Miralax and/or Dulcolax if symptoms persist. Doing better with Linzess      Preventative health care    Patient encouraged to maintain heart healthy diet, regular exercise, adequate sleep. Consider daily probiotics. Take medications as prescribed. Check labs today. Given and reviewed copy of ACP documents from [employer] of State  and encouraged to complete and return       Other Visit Diagnoses    Hyperglycemia       Relevant Orders   Hemoglobin A1c      I have changed [place] "Phil"'s amLODipine. I am also having him maintain his Co Q-10, Cholecalciferol (VITAMIN D PO), b complex vitamins, Probiotic Product (PROBIOTIC PO), aspirin EC, Glucosamine-Chondroit-Vit C-Mn (GLUCOSAMINE 1500 COMPLEX PO), magnesium oxide, Turmeric, MEGARED OMEGA-3 KRILL OIL, ramipril, chlorthalidone, rosuvastatin, clopidogrel, levothyroxine, pantoprazole, SUPREP BOWEL PREP KIT, and linaclotide.  Meds ordered this encounter  Medications  . amLODipine (NORVASC) 2.5 MG tablet    Sig: Take 1 tablet (2.5 mg total) by mouth 3 (three) times daily.    Dispense:  270 tablet    Refill:  1    CMA served as scribe during this visit. History, Physical and Plan performed by medical provider. Documentation and orders reviewed and attested to.  Penni Homans, MD

## 2017-10-05 NOTE — Assessment & Plan Note (Addendum)
Running 063 to 494 systolic at home. Increase Amlodipine to 2.5 mg tid. Encouraged heart healthy diet such as the DASH diet and exercise as tolerated.

## 2017-10-05 NOTE — Assessment & Plan Note (Signed)
Has repeat colonoscopy scheduled with Dr Hilarie Fredrickson next month

## 2017-10-05 NOTE — Assessment & Plan Note (Addendum)
Patient encouraged to maintain heart healthy diet, regular exercise, adequate sleep. Consider daily probiotics. Take medications as prescribed. Check labs today. Given and reviewed copy of ACP documents from [employer] and encouraged to complete and return

## 2017-10-05 NOTE — Assessment & Plan Note (Signed)
Check cbc 

## 2017-10-05 NOTE — Assessment & Plan Note (Signed)
Encouraged DASH diet, decrease po intake and increase exercise as tolerated. Needs 7-8 hours of sleep nightly. Avoid trans fats, eat small, frequent meals every 4-5 hours with lean proteins, complex carbs and healthy fats. Minimize simple carbs 

## 2017-10-05 NOTE — Assessment & Plan Note (Signed)
On Levothyroxine, continue to monitor 

## 2017-10-05 NOTE — Patient Instructions (Addendum)
Health Care Power of Attorney and Living Will  Pneumovax is the shot you got in 2009 check with insurance and confirm coverage then return shot  Preventive Care 65 Years and Older, Male Preventive care refers to lifestyle choices and visits with your health care provider that can promote health and wellness. What does preventive care include?  A yearly physical exam. This is also called an annual well check.  Dental exams once or twice a year.  Routine eye exams. Ask your health care provider how often you should have your eyes checked.  Personal lifestyle choices, including: ? Daily care of your teeth and gums. ? Regular physical activity. ? Eating a healthy diet. ? Avoiding tobacco and drug use. ? Limiting alcohol use. ? Practicing safe sex. ? Taking low doses of aspirin every day. ? Taking vitamin and mineral supplements as recommended by your health care provider. What happens during an annual well check? The services and screenings done by your health care provider during your annual well check will depend on your age, overall health, lifestyle risk factors, and family history of disease. Counseling Your health care provider may ask you questions about your:  Alcohol use.  Tobacco use.  Drug use.  Emotional well-being.  Home and relationship well-being.  Sexual activity.  Eating habits.  History of falls.  Memory and ability to understand (cognition).  Work and work Statistician.  Screening You may have the following tests or measurements:  Height, weight, and BMI.  Blood pressure.  Lipid and cholesterol levels. These may be checked every 5 years, or more frequently if you are over 67 years old.  Skin check.  Lung cancer screening. You may have this screening every year starting at age 62 if you have a 30-pack-year history of smoking and currently smoke or have quit within the past 15 years.  Fecal occult blood test (FOBT) of the stool. You may have  this test every year starting at age 39.  Flexible sigmoidoscopy or colonoscopy. You may have a sigmoidoscopy every 5 years or a colonoscopy every 10 years starting at age 57.  Prostate cancer screening. Recommendations will vary depending on your family history and other risks.  Hepatitis C blood test.  Hepatitis B blood test.  Sexually transmitted disease (STD) testing.  Diabetes screening. This is done by checking your blood sugar (glucose) after you have not eaten for a while (fasting). You may have this done every 1-3 years.  Abdominal aortic aneurysm (AAA) screening. You may need this if you are a current or former smoker.  Osteoporosis. You may be screened starting at age 53 if you are at high risk.  Talk with your health care provider about your test results, treatment options, and if necessary, the need for more tests. Vaccines Your health care provider may recommend certain vaccines, such as:  Influenza vaccine. This is recommended every year.  Tetanus, diphtheria, and acellular pertussis (Tdap, Td) vaccine. You may need a Td booster every 10 years.  Varicella vaccine. You may need this if you have not been vaccinated.  Zoster vaccine. You may need this after age 17.  Measles, mumps, and rubella (MMR) vaccine. You may need at least one dose of MMR if you were born in 1957 or later. You may also need a second dose.  Pneumococcal 13-valent conjugate (PCV13) vaccine. One dose is recommended after age 55.  Pneumococcal polysaccharide (PPSV23) vaccine. One dose is recommended after age 47.  Meningococcal vaccine. You may need this if  you have certain conditions.  Hepatitis A vaccine. You may need this if you have certain conditions or if you travel or work in places where you may be exposed to hepatitis A.  Hepatitis B vaccine. You may need this if you have certain conditions or if you travel or work in places where you may be exposed to hepatitis B.  Haemophilus  influenzae type b (Hib) vaccine. You may need this if you have certain risk factors.  Talk to your health care provider about which screenings and vaccines you need and how often you need them. This information is not intended to replace advice given to you by your health care provider. Make sure you discuss any questions you have with your health care provider. Document Released: 08/02/2015 Document Revised: 03/25/2016 Document Reviewed: 05/07/2015 Elsevier Interactive Patient Education  [employer].

## 2017-10-05 NOTE — Assessment & Plan Note (Addendum)
Encouraged increased hydration and fiber in diet. Daily probiotics. If bowels not moving can use MOM 2 tbls po in 4 oz of warm prune juice by mouth every 2-3 days. If no results then repeat in 4 hours with  Dulcolax suppository pr, may repeat again in 4 more hours as needed. Seek care if symptoms worsen. Consider daily Miralax and/or Dulcolax if symptoms persist. Doing better with Linzess

## 2017-10-06 ENCOUNTER — Encounter: Payer: Self-pay | Admitting: Family Medicine

## 2017-10-06 ENCOUNTER — Telehealth: Payer: Self-pay | Admitting: Family Medicine

## 2017-10-06 MED ORDER — LEVOTHYROXINE SODIUM 88 MCG PO TABS: 88.0000 ug | ORAL_TABLET | Freq: Every day | ORAL | 3 refills | Status: DC

## 2017-10-06 NOTE — Telephone Encounter (Signed)
Copied from [place]. Topic: Quick Communication - See Telephone Encounter >> Oct 06, 2017 10:38 AM Aurelio Brash B wrote: CRM for notification. See Telephone encounter for:  PT would like a call to go over his lab work  he is having issues with mychart and does not know abbreviations.  10/06/17.

## 2017-10-06 NOTE — Addendum Note (Signed)
Addended by: Wynonia Musty A on: 10/06/2017 04:24 PM   Modules accepted: Orders

## 2017-10-08 NOTE — Telephone Encounter (Signed)
Taking care of via mychart

## 2017-10-15 ENCOUNTER — Other Ambulatory Visit: Payer: Self-pay | Admitting: Internal Medicine

## 2017-10-18 NOTE — Telephone Encounter (Signed)
Done

## 2017-10-19 ENCOUNTER — Encounter: Payer: Self-pay | Admitting: Internal Medicine

## 2017-10-23 ENCOUNTER — Other Ambulatory Visit: Payer: Self-pay | Admitting: Family Medicine

## 2017-10-25 ENCOUNTER — Encounter: Payer: Self-pay | Admitting: Family Medicine

## 2017-10-27 MED ORDER — LEVOTHYROXINE SODIUM 88 MCG PO TABS: 88.0000 ug | ORAL_TABLET | Freq: Every day | ORAL | 1 refills | Status: DC

## 2017-11-01 ENCOUNTER — Encounter: Payer: Self-pay | Admitting: Internal Medicine

## 2017-11-01 ENCOUNTER — Ambulatory Visit (AMBULATORY_SURGERY_CENTER): Payer: Medicare Other | Admitting: Internal Medicine

## 2017-11-01 ENCOUNTER — Other Ambulatory Visit: Payer: Self-pay

## 2017-11-01 VITALS — BP 133/83 | HR 64 | Temp 98.4°F | Resp 18 | Ht 74.0 in | Wt 214.0 lb

## 2017-11-01 DIAGNOSIS — Z86010 Personal history of colonic polyps: Secondary | ICD-10-CM

## 2017-11-01 DIAGNOSIS — D124 Benign neoplasm of descending colon: Secondary | ICD-10-CM | POA: Diagnosis not present

## 2017-11-01 DIAGNOSIS — R194 Change in bowel habit: Secondary | ICD-10-CM

## 2017-11-01 MED ORDER — LINACLOTIDE 145 MCG PO CAPS: 145.0000 ug | ORAL_CAPSULE | Freq: Every day | ORAL | 2 refills | Status: DC

## 2017-11-01 MED ORDER — SODIUM CHLORIDE 0.9 % IV SOLN: 500.0000 mL | Freq: Once | INTRAVENOUS | Status: DC

## 2017-11-01 NOTE — Progress Notes (Signed)
No problems noted in the recovery room. maw 

## 2017-11-01 NOTE — Op Note (Signed)
[place] Patient Name: Tanner [name] Procedure Date: 11/01/2017 8:03 AM MRN: [id number] Endoscopist: Jerene Bears , MD Age: 83 Referring MD:  Date of Birth: Aug 22, 1934 Gender: Male Account #: [id number] Procedure:                Colonoscopy Indications:              Change in bowel habits, incidental personal history                            of colon polyps Medicines:                Monitored Anesthesia Care Procedure:                Pre-Anesthesia Assessment:                           - Prior to the procedure, a History and Physical                            was performed, and patient medications and                            allergies were reviewed. The patient's tolerance of                            previous anesthesia was also reviewed. The risks                            and benefits of the procedure and the sedation                            options and risks were discussed with the patient.                            All questions were answered, and informed consent                            was obtained. Prior Anticoagulants: The patient has                            taken Plavix (clopidogrel), last dose was 5 days                            prior to procedure. ASA Grade Assessment: III - A                            patient with severe systemic disease. After                            reviewing the risks and benefits, the patient was                            deemed in satisfactory condition to undergo the  procedure.                           After obtaining informed consent, the colonoscope                            was passed under direct vision. Throughout the                            procedure, the patient's blood pressure, pulse, and                            oxygen saturations were monitored continuously. The                            Colonoscope was introduced through the anus and   advanced to the the cecum, identified by                            appendiceal orifice and ileocecal valve. The                            colonoscopy was performed without difficulty. The                            patient tolerated the procedure well. The quality                            of the bowel preparation was good. The ileocecal                            valve, appendiceal orifice, and rectum were                            photographed. Scope In: 8:14:10 AM Scope Out: 8:28:23 AM Scope Withdrawal Time: 0 hours 10 minutes 39 seconds  Total Procedure Duration: 0 hours 14 minutes 13 seconds  Findings:                 The digital rectal exam was normal.                           A 2 mm polyp was found in the descending colon. The                            polyp was sessile. The polyp was removed with a                            cold snare. Resection and retrieval were complete.                           Multiple small and large-mouthed diverticula were                            found in the sigmoid colon and descending colon.  Internal hemorrhoids were found during                            retroflexion. The hemorrhoids were small and                            medium-sized.                           The exam was otherwise without abnormality. Complications:            No immediate complications. Estimated Blood Loss:     Estimated blood loss: none. Impression:               - One 2 mm polyp in the descending colon, removed                            with a cold snare. Resected and retrieved.                           - Mild diverticulosis in the sigmoid colon and in                            the descending colon.                           - Internal hemorrhoids.                           - The examination was otherwise normal. Recommendation:           - Patient has a contact number available for                            emergencies. The signs  and symptoms of potential                            delayed complications were discussed with the                            patient. Return to normal activities tomorrow.                            Written discharge instructions were provided to the                            patient.                           - Resume previous diet.                           - Continue present medications.                           - Resume Plavix (clopidogrel) at prior dose today.  Refer to managing physician for further adjustment                            of therapy.                           - Await pathology results.                           - No repeat screening/surveillance colonoscopy due                            to age at next examination interval. Jerene Bears, MD 11/01/2017 8:35:19 AM This report has been signed electronically.

## 2017-11-01 NOTE — Progress Notes (Signed)
Called to room to assist during endoscopic procedure.  Patient ID and intended procedure confirmed with present staff. Received instructions for my participation in the procedure from the performing physician.  

## 2017-11-01 NOTE — Progress Notes (Signed)
Pt. Reports no change in his medical or surgical history since 10/05/17, or since pre-visit 08/19/2017.

## 2017-11-01 NOTE — Progress Notes (Signed)
Report given to PACU, vss 

## 2017-11-01 NOTE — Patient Instructions (Signed)
YOU HAD AN ENDOSCOPIC PROCEDURE TODAY AT [facility]:   Refer to the procedure report that was given to you for any specific questions about what was found during the examination.  If the procedure report does not answer your questions, please call your gastroenterologist to clarify.  If you requested that your care partner not be given the details of your procedure findings, then the procedure report has been included in a sealed envelope for you to review at your convenience later.  YOU SHOULD EXPECT: Some feelings of bloating in the abdomen. Passage of more gas than usual.  Walking can help get rid of the air that was put into your GI tract during the procedure and reduce the bloating. If you had a lower endoscopy (such as a colonoscopy or flexible sigmoidoscopy) you may notice spotting of blood in your stool or on the toilet paper. If you underwent a bowel prep for your procedure, you may not have a normal bowel movement for a few days.  Please Note:  You might notice some irritation and congestion in your nose or some drainage.  This is from the oxygen used during your procedure.  There is no need for concern and it should clear up in a day or so.  SYMPTOMS TO REPORT IMMEDIATELY:   Following lower endoscopy (colonoscopy or flexible sigmoidoscopy):  Excessive amounts of blood in the stool  Significant tenderness or worsening of abdominal pains  Swelling of the abdomen that is new, acute  Fever of 100F or higher   For urgent or emergent issues, a gastroenterologist can be reached at any hour by calling [phone].   DIET:  We do recommend a small meal at first, but then you may proceed to your regular diet.  Drink plenty of fluids but you should avoid alcoholic beverages for 24 hours.  ACTIVITY:  You should plan to take it easy for the rest of today and you should NOT DRIVE or use heavy machinery until tomorrow (because of the sedation medicines used during the test).     FOLLOW UP: Our staff will call the number listed on your records the next business day following your procedure to check on you and address any questions or concerns that you may have regarding the information given to you following your procedure. If we do not reach you, we will leave a message.  However, if you are feeling well and you are not experiencing any problems, there is no need to return our call.  We will assume that you have returned to your regular daily activities without incident.  If any biopsies were taken you will be contacted by phone or by letter within the next 1-3 weeks.  Please call us at [phone] if you have not heard about the biopsies in 3 weeks.    SIGNATURES/CONFIDENTIALITY: You and/or your care partner have signed paperwork which will be entered into your electronic medical record.  These signatures attest to the fact that that the information above on your After Visit Summary has been reviewed and is understood.  Full responsibility of the confidentiality of this discharge information lies with you and/or your care-partner.   Handouts were given to your care partner on polyps, diverticulosis, and hemorrhoids. Per Dr. Hilarie Fredrickson resume your PLAVIX at prior dose today.  Refer to managing MD for further adjustment of therapy. You may resume your other current medications today. Await biopsy results. Please call if any questions or concerns.

## 2017-11-02 ENCOUNTER — Telehealth: Payer: Self-pay | Admitting: *Deleted

## 2017-11-02 NOTE — Telephone Encounter (Signed)
  Follow up Call-  Call back number 11/01/2017  Post procedure Call Back phone  # [phone]  Permission to leave phone message Yes  Some recent data might be hidden     Patient questions:  Do you have a fever, pain , or abdominal swelling? No. Pain Score  0 *  Have you tolerated food without any problems? Yes.    Have you been able to return to your normal activities? Yes.    Do you have any questions about your discharge instructions: Diet   No. Medications  No. Follow up visit  No.  Do you have questions or concerns about your Care? No.  Actions: * If pain score is 4 or above: No action needed, pain <4.

## 2017-11-08 ENCOUNTER — Encounter: Payer: Self-pay | Admitting: Internal Medicine

## 2017-11-09 ENCOUNTER — Ambulatory Visit (INDEPENDENT_AMBULATORY_CARE_PROVIDER_SITE_OTHER): Payer: Medicare Other | Admitting: Internal Medicine

## 2017-11-09 VITALS — BP 148/76 | HR 68

## 2017-11-09 DIAGNOSIS — I10 Essential (primary) hypertension: Secondary | ICD-10-CM | POA: Diagnosis not present

## 2017-11-09 NOTE — Progress Notes (Signed)
Pre visit review using our clinic review tool, if applicable. No additional management support is needed unless otherwise documented below in the visit note.  Pt here for blood pressure and pulse check per Dr [name].  Last visit his amlodipine was increased to 2.5mg  three times daily due to elevated home systolic readings 015-615.  BP today @ 9:54 = 148/76 Pulse = 68  Repeat BP @ 10:04 = 138/80  Pt currently taking: Ramipril 10mg  twice a day Chlorthalidone 25mg  every morning Amlodipine 2.5mg  three times a day.  Advised pt per covering Provider, Dr Larose Kells to increase Amlodipine to 10 mg once daily, watch for leg swelling and call with home BP readings in 1 month.   Pt states he "respects MD recommendation but he will wait for Dr [name] to review and make recommendation". Pt states he just received a 90 day supply of Amlodipine 2.5mg . He states he will just increase to 2 tablets twice a day if Dr [name] agrees with above recommendation. He would like [place] to contact him after PCP reviews information and makes recommendation. He has been made aware that PCP is out of the office all week and it will not be addressed until next week and pt voices understanding.  Chart routed to covering Provider and PCP.  Kathlene November, MD

## 2017-11-21 ENCOUNTER — Encounter: Payer: Self-pay | Admitting: Family Medicine

## 2018-02-21 ENCOUNTER — Other Ambulatory Visit: Payer: Self-pay | Admitting: Family Medicine

## 2018-02-27 ENCOUNTER — Other Ambulatory Visit: Payer: Self-pay | Admitting: Family Medicine

## 2018-02-28 ENCOUNTER — Encounter: Payer: Self-pay | Admitting: Family Medicine

## 2018-03-01 NOTE — Addendum Note (Signed)
Addended by: Magdalene Molly A on: 03/01/2018 08:00 AM   Modules accepted: Orders

## 2018-03-01 NOTE — Telephone Encounter (Signed)
Done

## 2018-03-29 ENCOUNTER — Other Ambulatory Visit (INDEPENDENT_AMBULATORY_CARE_PROVIDER_SITE_OTHER): Payer: Medicare Other

## 2018-03-29 DIAGNOSIS — E039 Hypothyroidism, unspecified: Secondary | ICD-10-CM | POA: Diagnosis not present

## 2018-03-29 DIAGNOSIS — R739 Hyperglycemia, unspecified: Secondary | ICD-10-CM

## 2018-03-29 DIAGNOSIS — E782 Mixed hyperlipidemia: Secondary | ICD-10-CM

## 2018-03-29 DIAGNOSIS — R609 Edema, unspecified: Secondary | ICD-10-CM | POA: Diagnosis not present

## 2018-03-29 DIAGNOSIS — I10 Essential (primary) hypertension: Secondary | ICD-10-CM

## 2018-03-29 LAB — CBC
HCT: 42.1 % (ref 39.0–52.0)
HEMOGLOBIN: 14.2 g/dL (ref 13.0–17.0)
MCHC: 33.8 g/dL (ref 30.0–36.0)
MCV: 94 fl (ref 78.0–100.0)
PLATELETS: 163 10*3/uL (ref 150.0–400.0)
RBC: 4.47 Mil/uL (ref 4.22–5.81)
RDW: 14.7 % (ref 11.5–15.5)
WBC: 6.3 10*3/uL (ref 4.0–10.5)

## 2018-03-29 LAB — COMPREHENSIVE METABOLIC PANEL
ALK PHOS: 61 U/L (ref 39–117)
ALT: 13 U/L (ref 0–53)
AST: 13 U/L (ref 0–37)
Albumin: 4.7 g/dL (ref 3.5–5.2)
BUN: 18 mg/dL (ref 6–23)
CO2: 29 mEq/L (ref 19–32)
Calcium: 9.8 mg/dL (ref 8.4–10.5)
Chloride: 101 mEq/L (ref 96–112)
Creatinine, Ser: 1.14 mg/dL (ref 0.40–1.50)
GFR: 65.49 mL/min (ref >60.00)
GLUCOSE: 89 mg/dL (ref 70–99)
Potassium: 4.1 mEq/L (ref 3.5–5.1)
SODIUM: 138 meq/L (ref 135–145)
TOTAL PROTEIN: 7.3 g/dL (ref 6.0–8.3)
Total Bilirubin: 0.9 mg/dL (ref 0.2–1.2)

## 2018-03-29 LAB — LIPID PANEL
CHOLESTEROL: 152 mg/dL (ref 0–200)
HDL: 59.3 mg/dL (ref >39.00)
LDL CALC: 73 mg/dL (ref 0–99)
NonHDL: 92.58
Total CHOL/HDL Ratio: 3
Triglycerides: 98 mg/dL (ref 0.0–149.0)
VLDL: 19.6 mg/dL (ref 0.0–40.0)

## 2018-03-29 LAB — T4, FREE: FREE T4: 0.88 ng/dL (ref 0.60–1.60)

## 2018-03-29 LAB — TSH: TSH: 1.08 u[IU]/mL (ref 0.35–4.50)

## 2018-03-29 LAB — BRAIN NATRIURETIC PEPTIDE: PRO B NATRI PEPTIDE: 46 pg/mL (ref 0.0–100.0)

## 2018-03-29 LAB — HEMOGLOBIN A1C: HEMOGLOBIN A1C: 5.8 % (ref 4.6–6.5)

## 2018-04-05 ENCOUNTER — Ambulatory Visit: Payer: Medicare Other | Admitting: Family Medicine

## 2018-04-05 VITALS — BP 138/66 | HR 71 | Temp 98.3°F | Resp 18 | Ht 74.0 in | Wt 210.2 lb

## 2018-04-05 DIAGNOSIS — E782 Mixed hyperlipidemia: Secondary | ICD-10-CM

## 2018-04-05 DIAGNOSIS — I6350 Cerebral infarction due to unspecified occlusion or stenosis of unspecified cerebral artery: Secondary | ICD-10-CM

## 2018-04-05 DIAGNOSIS — R739 Hyperglycemia, unspecified: Secondary | ICD-10-CM

## 2018-04-05 DIAGNOSIS — R059 Cough, unspecified: Secondary | ICD-10-CM | POA: Insufficient documentation

## 2018-04-05 DIAGNOSIS — I10 Essential (primary) hypertension: Secondary | ICD-10-CM | POA: Diagnosis not present

## 2018-04-05 DIAGNOSIS — E039 Hypothyroidism, unspecified: Secondary | ICD-10-CM | POA: Diagnosis not present

## 2018-04-05 DIAGNOSIS — Z23 Encounter for immunization: Secondary | ICD-10-CM | POA: Diagnosis not present

## 2018-04-05 DIAGNOSIS — K5900 Constipation, unspecified: Secondary | ICD-10-CM

## 2018-04-05 DIAGNOSIS — G4733 Obstructive sleep apnea (adult) (pediatric): Secondary | ICD-10-CM

## 2018-04-05 DIAGNOSIS — R05 Cough: Secondary | ICD-10-CM

## 2018-04-05 NOTE — Assessment & Plan Note (Signed)
On Levothyroxine, continue to monitor 

## 2018-04-05 NOTE — Assessment & Plan Note (Signed)
No recent episdoes

## 2018-04-05 NOTE — Assessment & Plan Note (Signed)
Well controlled, no changes to meds. Encouraged heart healthy diet such as the DASH diet and exercise as tolerated.  °

## 2018-04-05 NOTE — Patient Instructions (Addendum)
Elevate feet above heart for 15 minutes a couple times. Compression hose can help. Watch the sodium. Can consider fluid pills if fluid worsens and does not resolve.  Hydrate well daily 60-80 of fluids daily Try adding doses of Benefiber to the routine in the weeks when it is loose and or using the Linzess every other day when it is loose.  Encouraged increased rest and hydration, add probiotics, zinc such as Coldeze or Xicam. Treat fevers as needed. Plain Mucinex twice daily. Elderberry and vitamine c If persists consider Zyrtec 10 mg and Zantac 150 mg twice daily to see if there is an improvement   Hypertension Hypertension is another name for high blood pressure. High blood pressure forces your heart to work harder to pump blood. This can cause problems over time. There are two numbers in a blood pressure reading. There is a top number (systolic) over a bottom number (diastolic). It is best to have a blood pressure below 120/80. Healthy choices can help lower your blood pressure. You may need medicine to help lower your blood pressure if:  Your blood pressure cannot be lowered with healthy choices.  Your blood pressure is higher than 130/80.  Follow these instructions at home: Eating and drinking  If directed, follow the DASH eating plan. This diet includes: ? Filling half of your plate at each meal with fruits and vegetables. ? Filling one quarter of your plate at each meal with whole grains. Whole grains include whole wheat pasta, brown rice, and whole grain bread. ? Eating or drinking low-fat dairy products, such as skim milk or low-fat yogurt. ? Filling one quarter of your plate at each meal with low-fat (lean) proteins. Low-fat proteins include fish, skinless chicken, eggs, beans, and tofu. ? Avoiding fatty meat, cured and processed meat, or chicken with skin. ? Avoiding premade or processed food.  Eat less than 1,500 mg of salt (sodium) a day.  Limit alcohol use to no more than 1  drink a day for nonpregnant women and 2 drinks a day for men. One drink equals 12 oz of beer, 5 oz of wine, or 1 oz of hard liquor. Lifestyle  Work with your doctor to stay at a healthy weight or to lose weight. Ask your doctor what the best weight is for you.  Get at least 30 minutes of exercise that causes your heart to beat faster (aerobic exercise) most days of the week. This may include walking, swimming, or biking.  Get at least 30 minutes of exercise that strengthens your muscles (resistance exercise) at least 3 days a week. This may include lifting weights or pilates.  Do not use any products that contain nicotine or tobacco. This includes cigarettes and e-cigarettes. If you need help quitting, ask your doctor.  Check your blood pressure at home as told by your doctor.  Keep all follow-up visits as told by your doctor. This is important. Medicines  Take over-the-counter and prescription medicines only as told by your doctor. Follow directions carefully.  Do not skip doses of blood pressure medicine. The medicine does not work as well if you skip doses. Skipping doses also puts you at risk for problems.  Ask your doctor about side effects or reactions to medicines that you should watch for. Contact a doctor if:  You think you are having a reaction to the medicine you are taking.  You have headaches that keep coming back (recurring).  You feel dizzy.  You have swelling in your ankles.  You have trouble with your vision. Get help right away if:  You get a very bad headache.  You start to feel confused.  You feel weak or numb.  You feel faint.  You get very bad pain in your: ? Chest. ? Belly (abdomen).  You throw up (vomit) more than once.  You have trouble breathing. Summary  Hypertension is another name for high blood pressure.  Making healthy choices can help lower blood pressure. If your blood pressure cannot be controlled with healthy choices, you may  need to take medicine. This information is not intended to replace advice given to you by your health care provider. Make sure you discuss any questions you have with your health care provider. Document Released: 12/23/2007 Document Revised: 06/03/2016 Document Reviewed: 06/03/2016 Elsevier Interactive Patient Education  [employer].

## 2018-04-05 NOTE — Addendum Note (Signed)
Addended by: Magdalene Molly A on: 04/05/2018 08:49 AM   Modules accepted: Orders

## 2018-04-05 NOTE — Assessment & Plan Note (Signed)
hgba1c acceptable, minimize simple carbs. Increase exercise as tolerated.  

## 2018-04-05 NOTE — Progress Notes (Signed)
Subjective:    Patient ID: Tanner [name], male    DOB: February 20, 1935, 83 y.o.   MRN: [id number]  No chief complaint on file.   HPI Patient is in today for follow up. He feels well enough. No recent febrile illness or hospitalizations. He is noting somecongestionand cough with PND but no production, fever, malaise, or systemic symptoms. No reflux. Notes some diarrhea occasionally but mostly he feels the linzess is working. He needs a new oral appliance for his sleep apnea, he uses it nightly. No polyuria or polydipsia. Denies CP/palp/SOB/HA/fevers/GI or GU c/o. Taking meds as prescribed  Past Medical History:  Diagnosis Date  . Allergy    seasonal- mostly spring  . Anemia 02/03/2011  . ASTHMA, CHILDHOOD 03/17/2010   mostly as child  . BCC (basal cell carcinoma) 12/08/2011  . CAROTID ARTERY OCCLUSION, WITH INFARCTION 03/17/2010  . CEREBROVASCULAR ACCIDENT 03/17/2010   partial loss of peripheral vision on left-"slight improved"  . CHICKENPOX, HX OF 03/17/2010  . Constipation 12/08/2014  . Epistaxis 09/06/2013  . FATIGUE 03/17/2010  . Hearing loss 11/04/2010   bilateral  . HYPERKALEMIA 05/27/2010  . Hyperlipidemia   . Hypertension   . Hypothyroid 12/08/2011  . Mixed hyperlipidemia 03/17/2010  . Oral lesion 11/20/2014  . Overweight(278.02) 07/01/2010  . Peripheral neuropathy 08/18/2016  . Personal history of other infectious and parasitic disease 03/17/2010  . Preventative health care 08/23/2016  . Skin lesion of right arm 11/20/2014  . Sleep apnea    uses mouthpiece only  . Thrombocytopenia ([place]) 04/05/2012  . TOBACCO ABUSE, HX OF 03/17/2010  . Tubular adenoma of colon 06/10/2011    Past Surgical History:  Procedure Laterality Date  . CAROTID ENDARTERECTOMY Left February 13, 2013   cea  . CATARACT EXTRACTION, BILATERAL Right    Cataract, and stigmatism  . COLONOSCOPY  Dec. 2014  . ENDARTERECTOMY Left 02/13/2013   Procedure: ENDARTERECTOMY CAROTID;  Surgeon: Rosetta Posner, MD;  Location: [facility];   Service: Vascular;  Laterality: Left;  . EUS N/A 08/17/2013   Procedure: UPPER ENDOSCOPIC ULTRASOUND (EUS) LINEAR;  Surgeon: Milus Banister, MD;  Location: [facility];  Service: Endoscopy;  Laterality: N/A;  . knee cartliage repair  1964, 1990   bilateral  . [place]   removed  . ROTATOR CUFF REPAIR     right  . TONSILLECTOMY    . UPPER GI ENDOSCOPY  Jan. 2015    Family History  Problem Relation Age of Onset  . Alzheimer's disease Mother   . Emphysema Sister        cigarettes  . Cancer Sister        lung, tobacco   . Heart disease Son   . Coronary artery disease Brother   . Heart attack Brother   . Other Brother        heart problems- from fathers side  . Cancer Brother 53       lung? smoker  . Heart disease Brother 47       Heart disease before age 37  . Cancer Sister        gyn  . Heart disease Sister   . Bipolar disorder Daughter   . Obesity Daughter   . Stroke Father   . Heart disease Sister     Social History   Socioeconomic History  . Marital status: Divorced    Spouse name: Not on file  . Number of children: 3  . Years of education: 28yrs  .  Highest education level: Not on file  Occupational History  . Occupation: retired    Fish farm manager: RETIRED  Social Needs  . Financial resource strain: Not on file  . Food insecurity:    Worry: Not on file    Inability: Not on file  . Transportation needs:    Medical: Not on file    Non-medical: Not on file  Tobacco Use  . Smoking status: Former Smoker    Packs/day: 2.00    Years: 20.00    Pack years: 40.00    Types: Cigarettes    Last attempt to quit: 07/19/1977    Years since quitting: 40.7  . Smokeless tobacco: Never Used  Substance and Sexual Activity  . Alcohol use: Yes    Alcohol/week: 7.0 standard drinks    Types: 7 Glasses of wine per week    Comment: dinner wine daily  . Drug use: No  . Sexual activity: Never  Lifestyle  . Physical activity:    Days per week: Not on file     Minutes per session: Not on file  . Stress: Not on file  Relationships  . Social connections:    Talks on phone: Not on file    Gets together: Not on file    Attends religious service: Not on file    Active member of club or organization: Not on file    Attends meetings of clubs or organizations: Not on file    Relationship status: Not on file  . Intimate partner violence:    Fear of current or ex partner: Not on file    Emotionally abused: Not on file    Physically abused: Not on file    Forced sexual activity: Not on file  Other Topics Concern  . Not on file  Social History Narrative   Pt lives at home alone.    Caffeine Use: very little   Heart healthy diet, is planning exercise      Retired from Event organiser    Outpatient Medications Prior to Visit  Medication Sig Dispense Refill  . amLODipine (NORVASC) 2.5 MG tablet Take 1 tablet (2.5 mg total) by mouth 3 (three) times daily. 270 tablet 0  . aspirin EC 81 MG tablet Take 81 mg by mouth daily.    [name] b complex vitamins tablet Take 1 tablet by mouth daily.    . chlorthalidone (HYGROTON) 25 MG tablet TAKE 1 TABLET BY MOUTH  EVERY MORNING 90 tablet 2  . Cholecalciferol (VITAMIN D PO) Take 2,000 mg by mouth daily.     . clopidogrel (PLAVIX) 75 MG tablet TAKE 1 TABLET BY MOUTH  DAILY WITH BREAKFAST 90 tablet 1  . Coenzyme Q10 (CO Q-10) 300 MG CAPS Take 1 capsule by mouth daily.    . Glucosamine-Chondroit-Vit C-Mn (GLUCOSAMINE 1500 COMPLEX PO) Take 1,500 mg by mouth daily.     [name] levothyroxine (SYNTHROID, LEVOTHROID) 88 MCG tablet TAKE 1 TABLET BY MOUTH  DAILY BEFORE BREAKFAST 90 tablet 1  . linaclotide (LINZESS) 145 MCG CAPS capsule Take 1 capsule (145 mcg total) by mouth daily before breakfast. 90 capsule 2  . magnesium oxide (MAG-OX) 400 MG tablet Take 400 mg by mouth daily.    [name] MEGARED OMEGA-3 KRILL OIL 500 MG CAPS Take 1,000 mg by mouth.    . pantoprazole (PROTONIX) 40 MG tablet TAKE 1 TABLET BY MOUTH  DAILY 90 tablet 1    . Probiotic Product (PROBIOTIC PO) Take 1 capsule by mouth daily.     [name]  ramipril (ALTACE) 10 MG capsule Take 1 capsule (10 mg total) by mouth 2 (two) times daily. 180 capsule 0  . rosuvastatin (CRESTOR) 40 MG tablet TAKE 1 TABLET BY MOUTH  EVERY EVENING 90 tablet 1  . Turmeric (CURCUMIN 95) 500 MG CAPS Take 500 mg by mouth.    [name] 0.9 %  sodium chloride infusion      No facility-administered medications prior to visit.     No Known Allergies  Review of Systems  Constitutional: Negative for fever and malaise/fatigue.  HENT: Negative for congestion.   Eyes: Negative for blurred vision.  Respiratory: Negative for shortness of breath.   Cardiovascular: Negative for chest pain, palpitations and leg swelling.  Gastrointestinal: Negative for abdominal pain, blood in stool and nausea.  Genitourinary: Negative for dysuria and frequency.  Musculoskeletal: Negative for falls.  Skin: Negative for rash.  Neurological: Negative for dizziness, loss of consciousness and headaches.  Endo/Heme/Allergies: Negative for environmental allergies.  Psychiatric/Behavioral: Negative for depression. The patient is not nervous/anxious.        Objective:    Physical Exam  Constitutional: He is oriented to person, place, and time. He appears well-developed and well-nourished. No distress.  HENT:  Head: Normocephalic and atraumatic.  Nose: Nose normal.  Eyes: Right eye exhibits no discharge. Left eye exhibits no discharge.  Neck: Normal range of motion. Neck supple.  Cardiovascular: Normal rate and regular rhythm.  Murmur heard. Pulmonary/Chest: Effort normal and breath sounds normal.  Abdominal: Soft. Bowel sounds are normal. There is no tenderness.  Musculoskeletal: He exhibits no edema.  Neurological: He is alert and oriented to person, place, and time.  Skin: Skin is warm and dry.  Psychiatric: He has a normal mood and affect.  Nursing note and vitals reviewed.   BP 138/66 (BP Location: Left Arm,  Patient Position: Sitting, Cuff Size: Normal)   Pulse 71   Temp 98.3 F (36.8 C) (Oral)   Resp 18   Ht 6\' 2"  (1.88 m)   Wt 210 lb 3.2 oz (95.3 kg)   SpO2 97%   BMI 26.99 kg/m  Wt Readings from Last 3 Encounters:  04/05/18 210 lb 3.2 oz (95.3 kg)  11/01/17 214 lb (97.1 kg)  10/05/17 214 lb 9.6 oz (97.3 kg)     Lab Results  Component Value Date   WBC 6.3 03/29/2018   HGB 14.2 03/29/2018   HCT 42.1 03/29/2018   PLT 163.0 03/29/2018   GLUCOSE 89 03/29/2018   CHOL 152 03/29/2018   TRIG 98.0 03/29/2018   HDL 59.30 03/29/2018   LDLCALC 73 03/29/2018   ALT 13 03/29/2018   AST 13 03/29/2018   NA 138 03/29/2018   K 4.1 03/29/2018   CL 101 03/29/2018   CREATININE 1.14 03/29/2018   BUN 18 03/29/2018   CO2 29 03/29/2018   TSH 1.08 03/29/2018   PSA 0.39 05/08/2014   INR 0.99 02/07/2013   HGBA1C 5.8 03/29/2018   MICROALBUR 2.0 (H) 12/03/2015    Lab Results  Component Value Date   TSH 1.08 03/29/2018   Lab Results  Component Value Date   WBC 6.3 03/29/2018   HGB 14.2 03/29/2018   HCT 42.1 03/29/2018   MCV 94.0 03/29/2018   PLT 163.0 03/29/2018   Lab Results  Component Value Date   NA 138 03/29/2018   K 4.1 03/29/2018   CO2 29 03/29/2018   GLUCOSE 89 03/29/2018   BUN 18 03/29/2018   CREATININE 1.14 03/29/2018   BILITOT 0.9 03/29/2018  ALKPHOS 61 03/29/2018   AST 13 03/29/2018   ALT 13 03/29/2018   PROT 7.3 03/29/2018   ALBUMIN 4.7 03/29/2018   CALCIUM 9.8 03/29/2018   GFR 65.49 03/29/2018   Lab Results  Component Value Date   CHOL 152 03/29/2018   Lab Results  Component Value Date   HDL 59.30 03/29/2018   Lab Results  Component Value Date   LDLCALC 73 03/29/2018   Lab Results  Component Value Date   TRIG 98.0 03/29/2018   Lab Results  Component Value Date   CHOLHDL 3 03/29/2018   Lab Results  Component Value Date   HGBA1C 5.8 03/29/2018       Assessment & Plan:   Problem List Items Addressed This Visit    Hypothyroid (Chronic)     On Levothyroxine, continue to monitor      Mixed hyperlipidemia    Tolerating statin, encouraged heart healthy diet, avoid trans fats, minimize simple carbs and saturated fats. Increase exercise as tolerated      Essential hypertension, benign    Well controlled, no changes to meds. Encouraged heart healthy diet such as the DASH diet and exercise as tolerated.       Cerebral artery occlusion with cerebral infarction (HCC)    No recent episdoes      OSA (obstructive sleep apnea)    Using oral appliance. His dentist Dr Ron Parker has told him it is time for a new device and they need a new prescription      Constipation    Encouraged increased hydration and fiber in diet. Daily probiotics. If bowels not moving can use MOM 2 tbls po in 4 oz of warm prune juice by mouth every 2-3 days. If no results then repeat in 4 hours with  Dulcolax suppository pr, may repeat again in 4 more hours as needed. Seek care if symptoms worsen. Consider daily Miralax and/or Dulcolax if symptoms persist. Using Linzess and it does help his bowels move. Follows with Dr Hilarie Fredrickson      Hyperglycemia    hgba1c acceptable, minimize simple carbs. Increase exercise as tolerated.          I am having Wendie Agreste. Mikolajczak "Phil" maintain his Co Q-10, Cholecalciferol (VITAMIN D PO), b complex vitamins, Probiotic Product (PROBIOTIC PO), aspirin EC, Glucosamine-Chondroit-Vit C-Mn (GLUCOSAMINE 1500 COMPLEX PO), magnesium oxide, Turmeric, MEGARED OMEGA-3 KRILL OIL, pantoprazole, rosuvastatin, clopidogrel, linaclotide, levothyroxine, chlorthalidone, amLODipine, and ramipril. We will stop administering sodium chloride.  No orders of the defined types were placed in this encounter.    Penni Homans, MD

## 2018-04-05 NOTE — Assessment & Plan Note (Addendum)
Using oral appliance. His dentist Dr Ron Parker has told him it is time for a new device and they need a new prescription

## 2018-04-05 NOTE — Assessment & Plan Note (Signed)
Encouraged increased hydration and fiber in diet. Daily probiotics. If bowels not moving can use MOM 2 tbls po in 4 oz of warm prune juice by mouth every 2-3 days. If no results then repeat in 4 hours with  Dulcolax suppository pr, may repeat again in 4 more hours as needed. Seek care if symptoms worsen. Consider daily Miralax and/or Dulcolax if symptoms persist. Using Linzess and it does help his bowels move. Follows with Dr Hilarie Fredrickson

## 2018-04-05 NOTE — Addendum Note (Signed)
Addended by: Wynonia Musty A on: 04/05/2018 08:48 AM   Modules accepted: Orders

## 2018-04-05 NOTE — Assessment & Plan Note (Signed)
PND is noted

## 2018-04-05 NOTE — Assessment & Plan Note (Signed)
Tolerating statin, encouraged heart healthy diet, avoid trans fats, minimize simple carbs and saturated fats. Increase exercise as tolerated 

## 2018-04-26 ENCOUNTER — Encounter: Payer: Self-pay | Admitting: Family

## 2018-04-26 ENCOUNTER — Ambulatory Visit: Payer: Medicare Other | Admitting: Family

## 2018-04-26 ENCOUNTER — Ambulatory Visit (HOSPITAL_COMMUNITY)
Admission: RE | Admit: 2018-04-26 | Discharge: 2018-04-26 | Disposition: A | Discharge status: Home / Self Care | Payer: Medicare Other | Source: Ambulatory Visit | Attending: Family | Admitting: Family

## 2018-04-26 ENCOUNTER — Other Ambulatory Visit: Payer: Self-pay

## 2018-04-26 VITALS — BP 138/78 | HR 55 | Resp 16 | Ht 74.0 in | Wt 210.9 lb

## 2018-04-26 DIAGNOSIS — I6522 Occlusion and stenosis of left carotid artery: Secondary | ICD-10-CM | POA: Diagnosis not present

## 2018-04-26 DIAGNOSIS — Z87891 Personal history of nicotine dependence: Secondary | ICD-10-CM | POA: Diagnosis present

## 2018-04-26 DIAGNOSIS — I6521 Occlusion and stenosis of right carotid artery: Secondary | ICD-10-CM | POA: Diagnosis not present

## 2018-04-26 DIAGNOSIS — Z9889 Other specified postprocedural states: Secondary | ICD-10-CM | POA: Diagnosis present

## 2018-04-26 NOTE — Progress Notes (Signed)
Chief Complaint: Follow up Extracranial Carotid Artery Stenosis   History of Present Illness  Tanner [name] is a 83 y.o. male who is s/p left carotid endarterectomy on 02/13/2013 by [name] for severe asymptomatic disease.  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 He returns today for follow up.  He had a TIA in 2011 or 2012 as manifested by transient lateral loss of vision in left eye, denies any other neurological symptoms, specifically denies hemiparesis, denies aphasia symptoms. He specifically denies any further transient ischemic attacks or stroke activity.  Patient states he was evaluated by [name], his current neurologist is [name]. Pt reports that he has neuropathy of unknown etiology, in his feet as manifested by some numbness.  He no longer feels the numbness in his left arm that he was having. This seems to be mostly positional and can occur when he first arises in the morning. He is left-handed.   He walks daily for 1.5 miles. He denies claudication type symptoms in legs with walking, denies non healing wounds.    He denies feeling light headed other than when he gets up too quick, denies chest pain or dyspnea.   He states his PCP is monitoring and has addressed his hypertension, adjusted his medications.     Diabetic: No Tobacco use: former smoker, quit in 1978, smoked x 20 years  Pt meds include: Statin : Yes ASA: Yes Other anticoagulants/antiplatelets: Plavix    Past Medical History:  Diagnosis Date  . Allergy    seasonal- mostly spring  . Anemia 02/03/2011  . ASTHMA, CHILDHOOD 03/17/2010   mostly as child  . BCC (basal cell carcinoma) 12/08/2011  . CAROTID ARTERY OCCLUSION, WITH INFARCTION 03/17/2010  . CEREBROVASCULAR ACCIDENT 03/17/2010   partial loss of peripheral vision on left-"slight improved"  . CHICKENPOX, HX OF 03/17/2010  . Constipation 12/08/2014  . Epistaxis 09/06/2013  . FATIGUE 03/17/2010  . Hearing loss  11/04/2010   bilateral  . HYPERKALEMIA 05/27/2010  . Hyperlipidemia   . Hypertension   . Hypothyroid 12/08/2011  . Mixed hyperlipidemia 03/17/2010  . Oral lesion 11/20/2014  . Overweight(278.02) 07/01/2010  . Peripheral neuropathy 08/18/2016  . Personal history of other infectious and parasitic disease 03/17/2010  . Preventative health care 08/23/2016  . Skin lesion of right arm 11/20/2014  . Sleep apnea    uses mouthpiece only  . Thrombocytopenia ([place]) 04/05/2012  . TOBACCO ABUSE, HX OF 03/17/2010  . Tubular adenoma of colon 06/10/2011    Social History Social History   Tobacco Use  . Smoking status: Former Smoker    Packs/day: 2.00    Years: 20.00    Pack years: 40.00    Types: Cigarettes    Last attempt to quit: 07/19/1977    Years since quitting: 40.7  . Smokeless tobacco: Never Used  Substance Use Topics  . Alcohol use: Yes    Alcohol/week: 7.0 standard drinks    Types: 7 Glasses of wine per week    Comment: dinner wine daily  . Drug use: No    Family History Family History  Problem Relation Age of Onset  . Alzheimer's disease Mother   . Emphysema Sister        cigarettes  . Cancer Sister        lung, tobacco   . Heart disease Son   . Coronary artery disease Brother   . Heart attack Brother   . Other Brother  heart problems- from fathers side  . Cancer Brother 54       lung? smoker  . Heart disease Brother 41       Heart disease before age 86  . Cancer Sister        gyn  . Heart disease Sister   . Bipolar disorder Daughter   . Obesity Daughter   . Stroke Father   . Heart disease Sister     Surgical History Past Surgical History:  Procedure Laterality Date  . CAROTID ENDARTERECTOMY Left February 13, 2013   cea  . CATARACT EXTRACTION, BILATERAL Right    Cataract, and stigmatism  . COLONOSCOPY  Dec. 2014  . ENDARTERECTOMY Left 02/13/2013   Procedure: ENDARTERECTOMY CAROTID;  Surgeon: Rosetta Posner, MD;  Location: [facility];  Service: Vascular;  Laterality:  Left;  . EUS N/A 08/17/2013   Procedure: UPPER ENDOSCOPIC ULTRASOUND (EUS) LINEAR;  Surgeon: Milus Banister, MD;  Location: [facility];  Service: Endoscopy;  Laterality: N/A;  . knee cartliage repair  1964, 1990   bilateral  . [place]   removed  . ROTATOR CUFF REPAIR     right  . TONSILLECTOMY    . UPPER GI ENDOSCOPY  Jan. 2015    No Known Allergies  Current Outpatient Medications  Medication Sig Dispense Refill  . amLODipine (NORVASC) 2.5 MG tablet Take 1 tablet (2.5 mg total) by mouth 3 (three) times daily. 270 tablet 0  . aspirin EC 81 MG tablet Take 81 mg by mouth daily.    [name] b complex vitamins tablet Take 1 tablet by mouth daily.    . chlorthalidone (HYGROTON) 25 MG tablet TAKE 1 TABLET BY MOUTH  EVERY MORNING 90 tablet 2  . Cholecalciferol (VITAMIN D PO) Take 2,000 mg by mouth daily.     . clopidogrel (PLAVIX) 75 MG tablet TAKE 1 TABLET BY MOUTH  DAILY WITH BREAKFAST 90 tablet 1  . Coenzyme Q10 (CO Q-10) 300 MG CAPS Take 1 capsule by mouth daily.    . Glucosamine-Chondroit-Vit C-Mn (GLUCOSAMINE 1500 COMPLEX PO) Take 1,500 mg by mouth daily.     [name] levothyroxine (SYNTHROID, LEVOTHROID) 88 MCG tablet TAKE 1 TABLET BY MOUTH  DAILY BEFORE BREAKFAST 90 tablet 1  . linaclotide (LINZESS) 145 MCG CAPS capsule Take 1 capsule (145 mcg total) by mouth daily before breakfast. 90 capsule 2  . magnesium oxide (MAG-OX) 400 MG tablet Take 400 mg by mouth daily.    [name] MEGARED OMEGA-3 KRILL OIL 500 MG CAPS Take 1,000 mg by mouth.    . pantoprazole (PROTONIX) 40 MG tablet TAKE 1 TABLET BY MOUTH  DAILY 90 tablet 1  . Probiotic Product (PROBIOTIC PO) Take 1 capsule by mouth daily.     . ramipril (ALTACE) 10 MG capsule Take 1 capsule (10 mg total) by mouth 2 (two) times daily. 180 capsule 0  . rosuvastatin (CRESTOR) 40 MG tablet TAKE 1 TABLET BY MOUTH  EVERY EVENING 90 tablet 1  . Turmeric (CURCUMIN 95) 500 MG CAPS Take 500 mg by mouth.     No current facility-administered  medications for this visit.     Review of Systems : See HPI for pertinent positives and negatives.  Physical Examination  Vitals:   04/26/18 0937 04/26/18 0939  BP: 140/76 138/78  Pulse: (!) 55   Resp: 16   SpO2: 100%   Weight: 210 lb 14.4 oz (95.7 kg)   Height: 6\' 2"  (1.88 m)  Body mass index is 27.08 kg/m.  General: WDWN male in NAD who appears younger than his stated age GAIT: normal Eyes: PERRLA HENT: No gross abnormalities.  Pulmonary:  Respirations are non-labored, good air movement in all fields, CTAB, no rales, rhonchi, or wheezing. Cardiac: regular rhythm, bradycardic (not on a beta blocker), no detected murmur.  VASCULAR EXAM Carotid Bruits Right Left   Positive Negative     Abdominal aortic pulse is not palpable. Radial pulses are 2+ palpable and equal.                                                                                                                            LE Pulses Right Left       POPLITEAL  not palpable   not palpable       POSTERIOR TIBIAL  not palpable   1+ palpable        DORSALIS PEDIS      ANTERIOR TIBIAL 2+ palpable  2+ palpable     Gastrointestinal: soft, nontender, BS WNL, no r/g, no palpable masses. Musculoskeletal: no muscle atrophy/wasting. M/S 5/5 throughout, extremities without ischemic changes. Skin: No rashes, no ulcers, no cellulitis.   Neurologic:  A&O X 3; appropriate affect, sensation is normal; speech is normal, CN 2-12 intact, pain and light touch intact in extremities, motor exam as listed above. Psychiatric: Normal thought content, mood appropriate to clinical situation.    Assessment: Tanner [name] is a 83 y.o. male who is s/p left carotid endarterectomy on 02/13/2013 for severe asymptomatic disease.  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 He had a TIA in 2011 or 2012 as manifested by transient lateral loss of vision in left eye, no other neurological symptoms, no  subsequentTIA or stroke activity.  His atherosclerotic risk factors include former smoker and OSA (treated). He takes a daily statin. ASA, and Plavix.  He walks 1.5 miles daily, appears fit and younger than his stated age.    DATA Carotid Duplex (04-26-18): Known occlusion of the right internal carotid artery. Right common carotid artery appears occluded. Patent left carotid endarterectomy site with no evidence of restenosis  Bilateral vertebral artery flow is antegrade.  Bilateral subclavian artery waveforms are normal.  No significant change in comparison to the previous examson 03/22/2014, 04/02/15, and 04/07/16, and 04-20-17.   Plan: Follow-up in 1yearwith Carotid Duplex scan.   I discussed in depth with the patient the nature of atherosclerosis, and emphasized the importance of maximal medical management including strict control of blood pressure, blood glucose, and lipid levels, obtaining regular exercise, and continued cessation of smoking.  The patient is aware that without maximal medical management the underlying atherosclerotic disease process will progress, limiting the benefit of any interventions. The patient was given information about stroke prevention and what symptoms should prompt the patient to seek immediate medical care. Thank you for allowing [place] to participate in this patient's care.  Clemon Chambers, RN, MSN,  FNP-C [facility] Office: [facility] Physician: Bishop Dublin  04/26/18 9:59 AM

## 2018-04-26 NOTE — Patient Instructions (Signed)

## 2018-05-02 NOTE — Progress Notes (Signed)
NEUROLOGY FOLLOW UP OFFICE NOTE  WACO FOERSTER [id number]  HISTORY OF PRESENT ILLNESS: Tanner [name] is an 83 year old left-handed male with hypertension, hyperlipidemia, hypothyroidism, OSA and former smoker who follows up for right MCA/PCA watershed infarct, carotid artery disease with right ICA occlusion and status post left CEA, and peripheral neuropathy.  UPDATE: 03/29/18:  LDL 73, Hgb A1c 5.8 He is taking aspirin 81 mg and Plavix 75 mg daily for secondary stroke prevention.  He is taking Crestor 40 mg daily. 04/26/18:  Carotid doppler:  Know right ICA occlusion.  Patent left carotid endarterectomy site with no evidence of restenosis.  Bilateral vertebral arteries with antegrade flow. 03/29/18:  TSH 1.08.  He walks about 1 1/2 miles daily.  He hasn't been using weights lately.  He has not been strict with his diet.  Neuropathy is unchanged.  Occasionally he notes a pain in the bottom of his foot while walking, but overall it is not painful.  Every few months he reports a stabbing pain in his ankle.    HISTORY: Stroke:   In August 2011, he had a stroke, presenting as left visual field cut. He had a right hemispheric stroke thought to be secondary to right ICA occlusion. MRI of brain revealed an acute watershed infarct in the right PCA/MCA distributions. CTA of the neck confirmed right common carotid artery occlusion at the origin. The left ICA revealed approximately 50% stenosis just distal to the bulb. CTA of the head revealed occluded right ICA with partial retrograde constitution from the left INCA via the PCA. There was opacification of the right MCA and right ACA from the left ICA via the Acomm. There was occlusion of the right PCA at its origin. Hgb A1c at that time was 5.6 and LDL was 210.  He was placed on Plavix. Follow up carotid dopplers revealed increased stenosis of the left ICA. Carotid doppler from 01/03/13 revealed more than 70% stenosis involving the left bulb  and left proximal ICA and 50-69% stenosis involving the left mid ICA, but without plaque. Stenosis of left ECA and left VA noted. CTA of the neck performed on 01/19/13 revealed 75-80% stenosis of the left ICA. He underwent a left CEA on 02/13/13.   04/02/15 Carotid doppler:  Right ICA occluded.  Left ICA patent.  No plaque.09/28/13 Hgb A1c 5.7 02/07/13 2D Echo: LVEF 55-65%, no regional wall motion abnormalities, mild MVR, mildly dilated left atrium, mildly dilated right ventricle, mildly increased systolic pressure in pulmonary arteries (PA peak pressure 27mm Hg).  He followed up with vascular surgery on 04/07/16.   Carotid doppler performed then again revealed known right ICA occlusion with patent left carotid endarterectomy site, not significantly changed compared to last exam.  Neuropathy:   He reports numbness on the bottom of his feet, mostly his toes.  He also has a discomfort involving the left shin, but it is not a pain.  He says she sometimes catches his toe of his left foot on the ground and may stumble.  This has been ongoing since his stroke.  He also reports having fractured his left ankle twice.  He does report crossing his legs.  He notes muscle cramps in the legs, which bother him at night.  NCV-EMG from 04/28/16 revealed chronic symmetric sensorimotor polyneuropathy.  Neuropathy labs include:  ANA negative, Sed Rate 3, B6 43.8, SPEP/IFE/UPEP negative, B12 855.  PAST MEDICAL HISTORY: Past Medical History:  Diagnosis Date  . Allergy    seasonal- mostly spring  .  Anemia 02/03/2011  . ASTHMA, CHILDHOOD 03/17/2010   mostly as child  . BCC (basal cell carcinoma) 12/08/2011  . CAROTID ARTERY OCCLUSION, WITH INFARCTION 03/17/2010  . CEREBROVASCULAR ACCIDENT 03/17/2010   partial loss of peripheral vision on left-"slight improved"  . CHICKENPOX, HX OF 03/17/2010  . Constipation 12/08/2014  . Epistaxis 09/06/2013  . FATIGUE 03/17/2010  . Hearing loss 11/04/2010   bilateral  . HYPERKALEMIA  05/27/2010  . Hyperlipidemia   . Hypertension   . Hypothyroid 12/08/2011  . Mixed hyperlipidemia 03/17/2010  . Oral lesion 11/20/2014  . Overweight(278.02) 07/01/2010  . Peripheral neuropathy 08/18/2016  . Personal history of other infectious and parasitic disease 03/17/2010  . Preventative health care 08/23/2016  . Skin lesion of right arm 11/20/2014  . Sleep apnea    uses mouthpiece only  . Thrombocytopenia ([place]) 04/05/2012  . TOBACCO ABUSE, HX OF 03/17/2010  . Tubular adenoma of colon 06/10/2011    MEDICATIONS: Current Outpatient Medications on File Prior to Visit  Medication Sig Dispense Refill  . amLODipine (NORVASC) 2.5 MG tablet Take 1 tablet (2.5 mg total) by mouth 3 (three) times daily. 270 tablet 0  . aspirin EC 81 MG tablet Take 81 mg by mouth daily.    [name] b complex vitamins tablet Take 1 tablet by mouth daily.    . chlorthalidone (HYGROTON) 25 MG tablet TAKE 1 TABLET BY MOUTH  EVERY MORNING 90 tablet 2  . Cholecalciferol (VITAMIN D PO) Take 2,000 mg by mouth daily.     . clopidogrel (PLAVIX) 75 MG tablet TAKE 1 TABLET BY MOUTH  DAILY WITH BREAKFAST 90 tablet 1  . Coenzyme Q10 (CO Q-10) 300 MG CAPS Take 1 capsule by mouth daily.    . Glucosamine-Chondroit-Vit C-Mn (GLUCOSAMINE 1500 COMPLEX PO) Take 1,500 mg by mouth daily.     [name] levothyroxine (SYNTHROID, LEVOTHROID) 88 MCG tablet TAKE 1 TABLET BY MOUTH  DAILY BEFORE BREAKFAST 90 tablet 1  . linaclotide (LINZESS) 145 MCG CAPS capsule Take 1 capsule (145 mcg total) by mouth daily before breakfast. 90 capsule 2  . magnesium oxide (MAG-OX) 400 MG tablet Take 400 mg by mouth daily.    [name] MEGARED OMEGA-3 KRILL OIL 500 MG CAPS Take 1,000 mg by mouth.    . pantoprazole (PROTONIX) 40 MG tablet TAKE 1 TABLET BY MOUTH  DAILY 90 tablet 1  . Probiotic Product (PROBIOTIC PO) Take 1 capsule by mouth daily.     . ramipril (ALTACE) 10 MG capsule Take 1 capsule (10 mg total) by mouth 2 (two) times daily. 180 capsule 0  . rosuvastatin (CRESTOR) 40 MG  tablet TAKE 1 TABLET BY MOUTH  EVERY EVENING 90 tablet 1  . Turmeric (CURCUMIN 95) 500 MG CAPS Take 500 mg by mouth.     No current facility-administered medications on file prior to visit.     ALLERGIES: No Known Allergies  FAMILY HISTORY: Family History  Problem Relation Age of Onset  . Alzheimer's disease Mother   . Emphysema Sister        cigarettes  . Cancer Sister        lung, tobacco   . Heart disease Son   . Coronary artery disease Brother   . Heart attack Brother   . Other Brother        heart problems- from fathers side  . Cancer Brother 27       lung? smoker  . Heart disease Brother 53       Heart disease before age  16  . Cancer Sister        gyn  . Heart disease Sister   . Bipolar disorder Daughter   . Obesity Daughter   . Stroke Father   . Heart disease Sister     SOCIAL HISTORY: Social History   Socioeconomic History  . Marital status: Divorced    Spouse name: Not on file  . Number of children: 3  . Years of education: 25yrs  . Highest education level: Not on file  Occupational History  . Occupation: retired    Fish farm manager: RETIRED  Social Needs  . Financial resource strain: Not on file  . Food insecurity:    Worry: Not on file    Inability: Not on file  . Transportation needs:    Medical: Not on file    Non-medical: Not on file  Tobacco Use  . Smoking status: Former Smoker    Packs/day: 2.00    Years: 20.00    Pack years: 40.00    Types: Cigarettes    Last attempt to quit: 07/19/1977    Years since quitting: 40.8  . Smokeless tobacco: Never Used  Substance and Sexual Activity  . Alcohol use: Yes    Alcohol/week: 7.0 standard drinks    Types: 7 Glasses of wine per week    Comment: dinner wine daily  . Drug use: No  . Sexual activity: Never  Lifestyle  . Physical activity:    Days per week: Not on file    Minutes per session: Not on file  . Stress: Not on file  Relationships  . Social connections:    Talks on phone: Not on file      Gets together: Not on file    Attends religious service: Not on file    Active member of club or organization: Not on file    Attends meetings of clubs or organizations: Not on file    Relationship status: Not on file  . Intimate partner violence:    Fear of current or ex partner: Not on file    Emotionally abused: Not on file    Physically abused: Not on file    Forced sexual activity: Not on file  Other Topics Concern  . Not on file  Social History Narrative   Pt lives at home alone.    Caffeine Use: very little   Heart healthy diet, is planning exercise      Retired from Event organiser    REVIEW OF SYSTEMS: Constitutional: No fevers, chills, or sweats, no generalized fatigue, change in appetite Eyes: No visual changes, double vision, eye pain Ear, nose and throat: No hearing loss, ear pain, nasal congestion, sore throat Cardiovascular: No chest pain, palpitations Respiratory:  No shortness of breath at rest or with exertion, wheezes GastrointestinaI: No nausea, vomiting, diarrhea, abdominal pain, fecal incontinence Genitourinary:  No dysuria, urinary retention or frequency Musculoskeletal:  No neck pain, back pain Integumentary: No rash, pruritus, skin lesions Neurological: as above Psychiatric: No depression, insomnia, anxiety Endocrine: No palpitations, fatigue, diaphoresis, mood swings, change in appetite, change in weight, increased thirst Hematologic/Lymphatic:  No purpura, petechiae. Allergic/Immunologic: no itchy/runny eyes, nasal congestion, recent allergic reactions, rashes  PHYSICAL EXAM: Blood pressure 138/74, pulse (!) 58, height 6\' 2"  (1.88 m), weight 211 lb (95.7 kg), SpO2 97 %. General: No acute distress.  Patient appears well-groomed.   Head:  Normocephalic/atraumatic Eyes:  Fundi examined but not visualized Neck: supple, no paraspinal tenderness, full range of motion Heart:  Regular rate  and rhythm Lungs:  Clear to auscultation bilaterally Back: No  paraspinal tenderness Neurological Exam: alert and oriented to person, place, and time. Attention span and concentration intact, recent and remote memory intact, fund of knowledge intact.  Speech fluent and not dysarthric, language intact.  Left homonymous hemianopsia.  Otherwise, CN II-XII intact. Bulk and tone normal, muscle strength 5/5 throughout.  Sensation to pinprick reduced up to above the ankles.  Sensation to vibration reduced up to below the knes.  Deep tendon reflexes 2+ throughout, toes downgoing.  Finger to nose and heel to shin testing intact.  Gait normal, Romberg negative.  IMPRESSION: 1.  Left sided homonymous hemianopsia as late effect of right MCA/PCA watershed infarct, secondary to right ICA occlusion and right PCA occlusion 2.  Asymptomatic left ICA disease status post left CEA, stable 3.  Hyperlipidemia 4.  Idiopathic polyneuropathy, progressed objectively on exam  PLAN: 1.  Aspirin and Plavix for secondary stroke prevention as managed by PCP 2.  Statin therapy is managed by PCP (LDL goal less than 70). 3.  Monitor carotid stenosis with vascular surgery 4.  Mediterranean diet 5.  Monitor neuropathy symptoms 6.  Follow up in one year  25 minutes spent face to face with patient, over 50% spent discussing management.  Metta Clines, DO  CC: Penni Homans, MD

## 2018-05-03 ENCOUNTER — Encounter: Payer: Self-pay | Admitting: Neurology

## 2018-05-03 ENCOUNTER — Telehealth: Payer: Self-pay | Admitting: *Deleted

## 2018-05-03 ENCOUNTER — Ambulatory Visit: Payer: Medicare Other | Admitting: Neurology

## 2018-05-03 VITALS — BP 138/74 | HR 58 | Ht 74.0 in | Wt 211.0 lb

## 2018-05-03 DIAGNOSIS — G609 Hereditary and idiopathic neuropathy, unspecified: Secondary | ICD-10-CM | POA: Diagnosis not present

## 2018-05-03 DIAGNOSIS — E782 Mixed hyperlipidemia: Secondary | ICD-10-CM

## 2018-05-03 DIAGNOSIS — I63231 Cerebral infarction due to unspecified occlusion or stenosis of right carotid arteries: Secondary | ICD-10-CM | POA: Diagnosis not present

## 2018-05-03 DIAGNOSIS — I69398 Other sequelae of cerebral infarction: Secondary | ICD-10-CM

## 2018-05-03 DIAGNOSIS — I6521 Occlusion and stenosis of right carotid artery: Secondary | ICD-10-CM | POA: Diagnosis not present

## 2018-05-03 DIAGNOSIS — H53469 Homonymous bilateral field defects, unspecified side: Secondary | ICD-10-CM

## 2018-05-03 NOTE — Patient Instructions (Addendum)
1.  Continue aspirin and Plavix 2.  Continue Crestor 3.  Continue walks and exercise 4.  Follow Mediterranean diet 5.  Follow up in one year or as needed.   Mediterranean Diet A Mediterranean diet refers to food and lifestyle choices that are based on the traditions of countries located on the [employer]. This way of eating has been shown to help prevent certain conditions and improve outcomes for people who have chronic diseases, like kidney disease and heart disease. What are tips for following this plan? Lifestyle  Cook and eat meals together with your family, when possible.  Drink enough fluid to keep your urine clear or pale yellow.  Be physically active every day. This includes: ? Aerobic exercise like running or swimming. ? Leisure activities like gardening, walking, or housework.  Get 7-8 hours of sleep each night.  If recommended by your health care provider, drink red wine in moderation. This means 1 glass a day for nonpregnant women and 2 glasses a day for men. A glass of wine equals 5 oz (150 mL). Reading food labels  Check the serving size of packaged foods. For foods such as rice and pasta, the serving size refers to the amount of cooked product, not dry.  Check the total fat in packaged foods. Avoid foods that have saturated fat or trans fats.  Check the ingredients list for added sugars, such as corn syrup. Shopping  At the grocery store, buy most of your food from the areas near the walls of the store. This includes: ? Fresh fruits and vegetables (produce). ? Grains, beans, nuts, and seeds. Some of these may be available in unpackaged forms or large amounts (in bulk). ? Fresh seafood. ? Poultry and eggs. ? Low-fat dairy products.  Buy whole ingredients instead of prepackaged foods.  Buy fresh fruits and vegetables in-season from local farmers markets.  Buy frozen fruits and vegetables in resealable bags.  If you do not have access to quality fresh  seafood, buy precooked frozen shrimp or canned fish, such as tuna, salmon, or sardines.  Buy small amounts of raw or cooked vegetables, salads, or olives from the deli or salad bar at your store.  Stock your pantry so you always have certain foods on hand, such as olive oil, canned tuna, canned tomatoes, rice, pasta, and beans. Cooking  Cook foods with extra-virgin olive oil instead of using butter or other vegetable oils.  Have meat as a side dish, and have vegetables or grains as your main dish. This means having meat in small portions or adding small amounts of meat to foods like pasta or stew.  Use beans or vegetables instead of meat in common dishes like chili or lasagna.  Experiment with different cooking methods. Try roasting or broiling vegetables instead of steaming or sauteing them.  Add frozen vegetables to soups, stews, pasta, or rice.  Add nuts or seeds for added healthy fat at each meal. You can add these to yogurt, salads, or vegetable dishes.  Marinate fish or vegetables using olive oil, lemon juice, garlic, and fresh herbs. Meal planning  Plan to eat 1 vegetarian meal one day each week. Try to work up to 2 vegetarian meals, if possible.  Eat seafood 2 or more times a week.  Have healthy snacks readily available, such as: ? Vegetable sticks with hummus. ? [place] yogurt. ? Fruit and nut trail mix.  Eat balanced meals throughout the week. This includes: ? Fruit: 2-3 servings a day ? Vegetables:  4-5 servings a day ? Low-fat dairy: 2 servings a day ? Fish, poultry, or lean meat: 1 serving a day ? Beans and legumes: 2 or more servings a week ? Nuts and seeds: 1-2 servings a day ? Whole grains: 6-8 servings a day ? Extra-virgin olive oil: 3-4 servings a day  Limit red meat and sweets to only a few servings a month What are my food choices?  Mediterranean diet ? Recommended ? Grains: Whole-grain pasta. Brown rice. Bulgar wheat. Polenta. Couscous. Whole-wheat  bread. [place]. ? Vegetables: Artichokes. Beets. Broccoli. Cabbage. Carrots. Eggplant. Green beans. Chard. Kale. Spinach. Onions. Leeks. Peas. Squash. Tomatoes. Peppers. Radishes. ? Fruits: Apples. Apricots. Avocado. Berries. Bananas. Cherries. Dates. Figs. Grapes. Lemons. Melon. Oranges. Peaches. Plums. Pomegranate. ? Meats and other protein foods: Beans. Almonds. Sunflower seeds. Pine nuts. Peanuts. [place]. Salmon. Scallops. Shrimp. [place]. Tilapia. Clams. Oysters. Eggs. ? Dairy: Low-fat milk. Cheese. Greek yogurt. ? Beverages: Water. Red wine. Herbal tea. ? Fats and oils: Extra virgin olive oil. Avocado oil. Grape seed oil. ? Sweets and desserts: [place] yogurt with honey. Baked apples. Poached pears. Trail mix. ? Seasoning and other foods: Basil. Cilantro. Coriander. Cumin. Mint. Parsley. Sage. Rosemary. Tarragon. Garlic. Oregano. Thyme. Pepper. Balsalmic vinegar. Tahini. Hummus. Tomato sauce. Olives. Mushrooms. ? Limit these ? Grains: Prepackaged pasta or rice dishes. Prepackaged cereal with added sugar. ? Vegetables: Deep fried potatoes (french fries). ? Fruits: Fruit canned in syrup. ? Meats and other protein foods: Beef. Pork. Lamb. Poultry with skin. Hot dogs. [place] Salines. ? Dairy: Ice cream. Sour cream. Whole milk. ? Beverages: Juice. Sugar-sweetened soft drinks. Beer. Liquor and spirits. ? Fats and oils: Butter. Canola oil. Vegetable oil. Beef fat (tallow). Lard. ? Sweets and desserts: Cookies. Cakes. Pies. Candy. ? Seasoning and other foods: Mayonnaise. Premade sauces and marinades. ? The items listed may not be a complete list. Talk with your dietitian about what dietary choices are right for you. Summary  The Mediterranean diet includes both food and lifestyle choices.  Eat a variety of fresh fruits and vegetables, beans, nuts, seeds, and whole grains.  Limit the amount of red meat and sweets that you eat.  Talk with your health care provider about whether it is safe for you to  drink red wine in moderation. This means 1 glass a day for nonpregnant women and 2 glasses a day for men. A glass of wine equals 5 oz (150 mL). This information is not intended to replace advice given to you by your health care provider. Make sure you discuss any questions you have with your health care provider. Document Released: 02/27/2016 Document Revised: 03/31/2016 Document Reviewed: 02/27/2016 Elsevier Interactive Patient Education  [employer].

## 2018-05-03 NOTE — Telephone Encounter (Signed)
Received Physician Orders from [place] for new Oral Sleep Appliance; forwarded to provider/SLS 10/15

## 2018-05-10 ENCOUNTER — Other Ambulatory Visit: Payer: Self-pay | Admitting: Family Medicine

## 2018-05-10 ENCOUNTER — Other Ambulatory Visit: Payer: Self-pay | Admitting: Internal Medicine

## 2018-07-28 ENCOUNTER — Other Ambulatory Visit: Payer: Self-pay | Admitting: Internal Medicine

## 2018-09-27 ENCOUNTER — Other Ambulatory Visit (INDEPENDENT_AMBULATORY_CARE_PROVIDER_SITE_OTHER): Payer: Medicare Other

## 2018-09-27 DIAGNOSIS — E782 Mixed hyperlipidemia: Secondary | ICD-10-CM

## 2018-09-27 DIAGNOSIS — R739 Hyperglycemia, unspecified: Secondary | ICD-10-CM

## 2018-09-27 DIAGNOSIS — I10 Essential (primary) hypertension: Secondary | ICD-10-CM

## 2018-09-27 DIAGNOSIS — E039 Hypothyroidism, unspecified: Secondary | ICD-10-CM | POA: Diagnosis not present

## 2018-09-27 LAB — COMPREHENSIVE METABOLIC PANEL
ALT: 12 U/L (ref 0–53)
AST: 15 U/L (ref 0–37)
Albumin: 4.7 g/dL (ref 3.5–5.2)
Alkaline Phosphatase: 60 U/L (ref 39–117)
BUN: 24 mg/dL — ABNORMAL HIGH (ref 6–23)
CHLORIDE: 102 meq/L (ref 96–112)
CO2: 28 mEq/L (ref 19–32)
Calcium: 9.9 mg/dL (ref 8.4–10.5)
Creatinine, Ser: 1.05 mg/dL (ref 0.40–1.50)
GFR: 67.67 mL/min (ref >60.00)
Glucose, Bld: 84 mg/dL (ref 70–99)
POTASSIUM: 3.8 meq/L (ref 3.5–5.1)
Sodium: 138 mEq/L (ref 135–145)
Total Bilirubin: 0.8 mg/dL (ref 0.2–1.2)
Total Protein: 7 g/dL (ref 6.0–8.3)

## 2018-09-27 LAB — LIPID PANEL
Cholesterol: 160 mg/dL (ref 0–200)
HDL: 49.2 mg/dL (ref >39.00)
LDL CALC: 85 mg/dL (ref 0–99)
NonHDL: 110.82
Total CHOL/HDL Ratio: 3
Triglycerides: 129 mg/dL (ref 0.0–149.0)
VLDL: 25.8 mg/dL (ref 0.0–40.0)

## 2018-09-27 LAB — CBC
HCT: 41.9 % (ref 39.0–52.0)
HEMOGLOBIN: 14.1 g/dL (ref 13.0–17.0)
MCHC: 33.7 g/dL (ref 30.0–36.0)
MCV: 94.6 fl (ref 78.0–100.0)
Platelets: 154 10*3/uL (ref 150.0–400.0)
RBC: 4.43 Mil/uL (ref 4.22–5.81)
RDW: 14.9 % (ref 11.5–15.5)
WBC: 5.7 10*3/uL (ref 4.0–10.5)

## 2018-09-27 LAB — HEMOGLOBIN A1C: Hgb A1c MFr Bld: 5.7 % (ref 4.6–6.5)

## 2018-09-27 LAB — TSH: TSH: 1.32 u[IU]/mL (ref 0.35–4.50)

## 2018-09-28 ENCOUNTER — Other Ambulatory Visit: Payer: Self-pay | Admitting: Family Medicine

## 2018-10-04 ENCOUNTER — Other Ambulatory Visit: Payer: Self-pay

## 2018-10-04 ENCOUNTER — Ambulatory Visit (INDEPENDENT_AMBULATORY_CARE_PROVIDER_SITE_OTHER): Payer: Medicare Other | Admitting: Family Medicine

## 2018-10-04 ENCOUNTER — Encounter: Payer: Self-pay | Admitting: Family Medicine

## 2018-10-04 DIAGNOSIS — R739 Hyperglycemia, unspecified: Secondary | ICD-10-CM

## 2018-10-04 DIAGNOSIS — E782 Mixed hyperlipidemia: Secondary | ICD-10-CM | POA: Diagnosis not present

## 2018-10-04 DIAGNOSIS — E039 Hypothyroidism, unspecified: Secondary | ICD-10-CM | POA: Diagnosis not present

## 2018-10-04 DIAGNOSIS — I10 Essential (primary) hypertension: Secondary | ICD-10-CM | POA: Diagnosis not present

## 2018-10-04 DIAGNOSIS — E663 Overweight: Secondary | ICD-10-CM

## 2018-10-04 DIAGNOSIS — Z0000 Encounter for general adult medical examination without abnormal findings: Secondary | ICD-10-CM

## 2018-10-04 NOTE — Assessment & Plan Note (Signed)
Well controlled, no changes to meds. Encouraged heart healthy diet such as the DASH diet and exercise as tolerated.  °

## 2018-10-04 NOTE — Assessment & Plan Note (Signed)
Encouraged heart healthy diet, increase exercise, avoid trans fats, consider a krill oil cap daily 

## 2018-10-04 NOTE — Assessment & Plan Note (Signed)
hgba1c acceptable, minimize simple carbs. Increase exercise as tolerated.  

## 2018-10-04 NOTE — Assessment & Plan Note (Signed)
Encouraged DASH diet, decrease po intake and increase exercise as tolerated. Needs 7-8 hours of sleep nightly. Avoid trans fats, eat small, frequent meals every 4-5 hours with lean proteins, complex carbs and healthy fats 

## 2018-10-04 NOTE — Progress Notes (Signed)
Subjective:    Patient ID: Tanner [name], male    DOB: Jun 03, 1935, 84 y.o.   MRN: [id number]  No chief complaint on file.   HPI Patient is in today for annual preventative exam and follow up on chronic medical concerns including hyperlipidemia, hyperglycemia, sleep apnea, and more. He feels well today.no recent febrile illness or hospitalizations. No polyuria or polydipsia. Denies CP/palp/SOB/HA/congestion/fevers/GI or GU c/o. Taking meds as prescribed. Is managing his activities of daily living. He is maintaining a heart healthy diet and stays active. No complaints of polyuria or polydipsia.   Past Medical History:  Diagnosis Date  . Allergy    seasonal- mostly spring  . Anemia 02/03/2011  . ASTHMA, CHILDHOOD 03/17/2010   mostly as child  . BCC (basal cell carcinoma) 12/08/2011  . CAROTID ARTERY OCCLUSION, WITH INFARCTION 03/17/2010  . CEREBROVASCULAR ACCIDENT 03/17/2010   partial loss of peripheral vision on left-"slight improved"  . CHICKENPOX, HX OF 03/17/2010  . Constipation 12/08/2014  . Epistaxis 09/06/2013  . FATIGUE 03/17/2010  . Hearing loss 11/04/2010   bilateral  . HYPERKALEMIA 05/27/2010  . Hyperlipidemia   . Hypertension   . Hypothyroid 12/08/2011  . Mixed hyperlipidemia 03/17/2010  . Oral lesion 11/20/2014  . Overweight(278.02) 07/01/2010  . Peripheral neuropathy 08/18/2016  . Personal history of other infectious and parasitic disease 03/17/2010  . Preventative health care 08/23/2016  . Skin lesion of right arm 11/20/2014  . Sleep apnea    uses mouthpiece only  . Thrombocytopenia ([place]) 04/05/2012  . TOBACCO ABUSE, HX OF 03/17/2010  . Tubular adenoma of colon 06/10/2011    Past Surgical History:  Procedure Laterality Date  . CAROTID ENDARTERECTOMY Left February 13, 2013   cea  . CATARACT EXTRACTION, BILATERAL Right    Cataract, and stigmatism  . COLONOSCOPY  Dec. 2014  . ENDARTERECTOMY Left 02/13/2013   Procedure: ENDARTERECTOMY CAROTID;  Surgeon: Rosetta Posner, MD;   Location: [facility];  Service: Vascular;  Laterality: Left;  . EUS N/A 08/17/2013   Procedure: UPPER ENDOSCOPIC ULTRASOUND (EUS) LINEAR;  Surgeon: Milus Banister, MD;  Location: [facility];  Service: Endoscopy;  Laterality: N/A;  . knee cartliage repair  1964, 1990   bilateral  . [place]   removed  . ROTATOR CUFF REPAIR     right  . TONSILLECTOMY    . UPPER GI ENDOSCOPY  Jan. 2015    Family History  Problem Relation Age of Onset  . Alzheimer's disease Mother   . Emphysema Sister        cigarettes  . Cancer Sister        lung, tobacco   . Heart disease Son   . Coronary artery disease Brother   . Heart attack Brother   . Other Brother        heart problems- from fathers side  . Cancer Brother 40       lung? smoker  . Heart disease Brother 109       Heart disease before age 57  . Cancer Sister        gyn  . Heart disease Sister   . Bipolar disorder Daughter   . Obesity Daughter   . Stroke Father   . Heart disease Sister     Social History   Socioeconomic History  . Marital status: Divorced    Spouse name: Not on file  . Number of children: 3  . Years of education: 72yrs  . Highest education level:  Not on file  Occupational History  . Occupation: retired    Fish farm manager: RETIRED  Social Needs  . Financial resource strain: Not on file  . Food insecurity:    Worry: Not on file    Inability: Not on file  . Transportation needs:    Medical: Not on file    Non-medical: Not on file  Tobacco Use  . Smoking status: Former Smoker    Packs/day: 2.00    Years: 20.00    Pack years: 40.00    Types: Cigarettes    Last attempt to quit: 07/19/1977    Years since quitting: 41.2  . Smokeless tobacco: Never Used  Substance and Sexual Activity  . Alcohol use: Yes    Alcohol/week: 7.0 standard drinks    Types: 7 Glasses of wine per week    Comment: dinner wine daily  . Drug use: No  . Sexual activity: Never  Lifestyle  . Physical activity:    Days per  week: Not on file    Minutes per session: Not on file  . Stress: Not on file  Relationships  . Social connections:    Talks on phone: Not on file    Gets together: Not on file    Attends religious service: Not on file    Active member of club or organization: Not on file    Attends meetings of clubs or organizations: Not on file    Relationship status: Not on file  . Intimate partner violence:    Fear of current or ex partner: Not on file    Emotionally abused: Not on file    Physically abused: Not on file    Forced sexual activity: Not on file  Other Topics Concern  . Not on file  Social History Narrative   Pt lives at home alone.    Caffeine Use: very little   Heart healthy diet, is planning exercise      Retired from Event organiser    Outpatient Medications Prior to Visit  Medication Sig Dispense Refill  . amLODipine (NORVASC) 2.5 MG tablet Take 1 tablet (2.5 mg total) by mouth 3 (three) times daily. 270 tablet 1  . aspirin EC 81 MG tablet Take 81 mg by mouth daily.    [name] b complex vitamins tablet Take 1 tablet by mouth daily.    . chlorthalidone (HYGROTON) 25 MG tablet TAKE 1 TABLET BY MOUTH  EVERY MORNING 90 tablet 2  . Cholecalciferol (VITAMIN D PO) Take 2,000 mg by mouth daily.     . clopidogrel (PLAVIX) 75 MG tablet Take 1 tablet (75 mg total) by mouth daily with breakfast. 90 tablet 1  . Coenzyme Q10 (CO Q-10) 300 MG CAPS Take 1 capsule by mouth daily.    . Glucosamine-Chondroit-Vit C-Mn (GLUCOSAMINE 1500 COMPLEX PO) Take 1,500 mg by mouth daily.     [name] levothyroxine (SYNTHROID, LEVOTHROID) 88 MCG tablet TAKE 1 TABLET BY MOUTH  DAILY BEFORE BREAKFAST 90 tablet 1  . LINZESS 145 MCG CAPS capsule TAKE 1 CAPSULE BY MOUTH  DAILY BEFORE BREAKFAST 90 capsule 1  . magnesium oxide (MAG-OX) 400 MG tablet Take 400 mg by mouth daily.    [name] MEGARED OMEGA-3 KRILL OIL 500 MG CAPS Take 1,000 mg by mouth.    . pantoprazole (PROTONIX) 40 MG tablet Take 1 tablet (40 mg total) by mouth  daily. 90 tablet 3  . Probiotic Product (PROBIOTIC PO) Take 1 capsule by mouth daily.     . ramipril (ALTACE)  10 MG capsule TAKE 1 CAPSULE BY MOUTH TWO TIMES DAILY 180 capsule 0  . rosuvastatin (CRESTOR) 40 MG tablet Take 1 tablet (40 mg total) by mouth every evening. 90 tablet 1  . Turmeric (CURCUMIN 95) 500 MG CAPS Take 500 mg by mouth.     No facility-administered medications prior to visit.     No Known Allergies  Review of Systems  Constitutional: Negative for fever and malaise/fatigue.  HENT: Negative for congestion.   Eyes: Negative for blurred vision.  Respiratory: Negative for shortness of breath.   Cardiovascular: Negative for chest pain, palpitations and leg swelling.  Gastrointestinal: Negative for abdominal pain, blood in stool and nausea.  Genitourinary: Negative for dysuria and frequency.  Musculoskeletal: Negative for falls.  Skin: Negative for rash.  Neurological: Negative for dizziness, loss of consciousness and headaches.  Endo/Heme/Allergies: Negative for environmental allergies.  Psychiatric/Behavioral: Negative for depression. The patient is not nervous/anxious.        Objective:    Physical Exam Vitals signs and nursing note reviewed.  Constitutional:      General: He is not in acute distress.    Appearance: He is well-developed.  HENT:     Head: Normocephalic and atraumatic.     Nose: Nose normal.  Eyes:     General:        Right eye: No discharge.        Left eye: No discharge.  Neck:     Musculoskeletal: Normal range of motion and neck supple.  Cardiovascular:     Rate and Rhythm: Normal rate and regular rhythm.     Heart sounds: No murmur.  Pulmonary:     Effort: Pulmonary effort is normal.     Breath sounds: Normal breath sounds.  Abdominal:     General: Bowel sounds are normal.     Palpations: Abdomen is soft.     Tenderness: There is no abdominal tenderness.  Skin:    General: Skin is warm and dry.  Neurological:     Mental Status:  He is alert and oriented to person, place, and time.     BP 126/72 (BP Location: Left Arm, Patient Position: Sitting, Cuff Size: Normal)   Pulse 65   Temp 98 F (36.7 C) (Oral)   Resp 18   Wt 209 lb 9.6 oz (95.1 kg)   SpO2 98%   BMI 26.91 kg/m  Wt Readings from Last 3 Encounters:  10/04/18 209 lb 9.6 oz (95.1 kg)  05/03/18 211 lb (95.7 kg)  04/26/18 210 lb 14.4 oz (95.7 kg)     Lab Results  Component Value Date   WBC 5.7 09/27/2018   HGB 14.1 09/27/2018   HCT 41.9 09/27/2018   PLT 154.0 09/27/2018   GLUCOSE 84 09/27/2018   CHOL 160 09/27/2018   TRIG 129.0 09/27/2018   HDL 49.20 09/27/2018   LDLCALC 85 09/27/2018   ALT 12 09/27/2018   AST 15 09/27/2018   NA 138 09/27/2018   K 3.8 09/27/2018   CL 102 09/27/2018   CREATININE 1.05 09/27/2018   BUN 24 (H) 09/27/2018   CO2 28 09/27/2018   TSH 1.32 09/27/2018   PSA 0.39 05/08/2014   INR 0.99 02/07/2013   HGBA1C 5.7 09/27/2018   MICROALBUR 2.0 (H) 12/03/2015    Lab Results  Component Value Date   TSH 1.32 09/27/2018   Lab Results  Component Value Date   WBC 5.7 09/27/2018   HGB 14.1 09/27/2018   HCT 41.9 09/27/2018  MCV 94.6 09/27/2018   PLT 154.0 09/27/2018   Lab Results  Component Value Date   NA 138 09/27/2018   K 3.8 09/27/2018   CO2 28 09/27/2018   GLUCOSE 84 09/27/2018   BUN 24 (H) 09/27/2018   CREATININE 1.05 09/27/2018   BILITOT 0.8 09/27/2018   ALKPHOS 60 09/27/2018   AST 15 09/27/2018   ALT 12 09/27/2018   PROT 7.0 09/27/2018   ALBUMIN 4.7 09/27/2018   CALCIUM 9.9 09/27/2018   GFR 67.67 09/27/2018   Lab Results  Component Value Date   CHOL 160 09/27/2018   Lab Results  Component Value Date   HDL 49.20 09/27/2018   Lab Results  Component Value Date   LDLCALC 85 09/27/2018   Lab Results  Component Value Date   TRIG 129.0 09/27/2018   Lab Results  Component Value Date   CHOLHDL 3 09/27/2018   Lab Results  Component Value Date   HGBA1C 5.7 09/27/2018       Assessment  & Plan:   Problem List Items Addressed This Visit    Hypothyroid (Chronic)    On Levothyroxine, continue to monitor      Relevant Orders   TSH   Mixed hyperlipidemia    Encouraged heart healthy diet, increase exercise, avoid trans fats, consider a krill oil cap daily      Relevant Orders   Lipid panel   Essential hypertension, benign    Well controlled, no changes to meds. Encouraged heart healthy diet such as the DASH diet and exercise as tolerated.       Relevant Orders   CBC   Comprehensive metabolic panel   Overweight    Encouraged DASH diet, decrease po intake and increase exercise as tolerated. Needs 7-8 hours of sleep nightly. Avoid trans fats, eat small, frequent meals every 4-5 hours with lean proteins, complex carbs and healthy fats.       Preventative health care    Patient encouraged to maintain heart healthy diet, regular exercise, adequate sleep. Consider daily probiotics. Take medications as prescribed      Hyperglycemia    hgba1c acceptable, minimize simple carbs. Increase exercise as tolerated.      Relevant Orders   Hemoglobin A1c      I am having Tanner Agreste. Colgan "Phil" maintain his Co Q-10, Cholecalciferol (VITAMIN D PO), b complex vitamins, Probiotic Product (PROBIOTIC PO), aspirin EC, Glucosamine-Chondroit-Vit C-Mn (GLUCOSAMINE 1500 COMPLEX PO), magnesium oxide, Turmeric, MegaRed Omega-3 Krill Oil, chlorthalidone, clopidogrel, amLODipine, rosuvastatin, pantoprazole, Linzess, ramipril, and levothyroxine.  No orders of the defined types were placed in this encounter.    Penni Homans, MD

## 2018-10-04 NOTE — Patient Instructions (Addendum)
Encouraged increased hydration and fiber in diet. Daily probiotics. If bowels not moving can use MOM 2 tbls po in 4 oz of warm prune juice by mouth every 2-3 days. If no results then repeat in 4 hours with  Dulcolax suppository pr, may repeat again in 4 more hours as needed. Seek care if symptoms worsen. Consider daily Miralax and/or Dulcolax if symptoms persist.     Hypertension Hypertension, commonly called high blood pressure, is when the force of blood pumping through the arteries is too strong. The arteries are the blood vessels that carry blood from the heart throughout the body. Hypertension forces the heart to work harder to pump blood and may cause arteries to become narrow or stiff. Having untreated or uncontrolled hypertension can cause heart attacks, strokes, kidney disease, and other problems. A blood pressure reading consists of a higher number over a lower number. Ideally, your blood pressure should be below 120/80. The first ("top") number is called the systolic pressure. It is a measure of the pressure in your arteries as your heart beats. The second ("bottom") number is called the diastolic pressure. It is a measure of the pressure in your arteries as the heart relaxes. What are the causes? The cause of this condition is not known. What increases the risk? Some risk factors for high blood pressure are under your control. Others are not. Factors you can change  Smoking.  Having type 2 diabetes mellitus, high cholesterol, or both.  Not getting enough exercise or physical activity.  Being overweight.  Having too much fat, sugar, calories, or salt (sodium) in your diet.  Drinking too much alcohol. Factors that are difficult or impossible to change  Having chronic kidney disease.  Having a family history of high blood pressure.  Age. Risk increases with age.  Race. You may be at higher risk if you are African-American.  Gender. Men are at higher risk than women before  age 12. After age 29, women are at higher risk than men.  Having obstructive sleep apnea.  Stress. What are the signs or symptoms? Extremely high blood pressure (hypertensive crisis) may cause:  Headache.  Anxiety.  Shortness of breath.  Nosebleed.  Nausea and vomiting.  Severe chest pain.  Jerky movements you cannot control (seizures). How is this diagnosed? This condition is diagnosed by measuring your blood pressure while you are seated, with your arm resting on a surface. The cuff of the blood pressure monitor will be placed directly against the skin of your upper arm at the level of your heart. It should be measured at least twice using the same arm. Certain conditions can cause a difference in blood pressure between your right and left arms. Certain factors can cause blood pressure readings to be lower or higher than normal (elevated) for a short period of time:  When your blood pressure is higher when you are in a health care provider's office than when you are at home, this is called white coat hypertension. Most people with this condition do not need medicines.  When your blood pressure is higher at home than when you are in a health care provider's office, this is called masked hypertension. Most people with this condition may need medicines to control blood pressure. If you have a high blood pressure reading during one visit or you have normal blood pressure with other risk factors:  You may be asked to return on a different day to have your blood pressure checked again.  You may  be asked to monitor your blood pressure at home for 1 week or longer. If you are diagnosed with hypertension, you may have other blood or imaging tests to help your health care provider understand your overall risk for other conditions. How is this treated? This condition is treated by making healthy lifestyle changes, such as eating healthy foods, exercising more, and reducing your alcohol  intake. Your health care provider may prescribe medicine if lifestyle changes are not enough to get your blood pressure under control, and if:  Your systolic blood pressure is above 130.  Your diastolic blood pressure is above 80. Your personal target blood pressure may vary depending on your medical conditions, your age, and other factors. Follow these instructions at home: Eating and drinking   Eat a diet that is high in fiber and potassium, and low in sodium, added sugar, and fat. An example eating plan is called the DASH (Dietary Approaches to Stop Hypertension) diet. To eat this way: ? Eat plenty of fresh fruits and vegetables. Try to fill half of your plate at each meal with fruits and vegetables. ? Eat whole grains, such as whole wheat pasta, brown rice, or whole grain bread. Fill about one quarter of your plate with whole grains. ? Eat or drink low-fat dairy products, such as skim milk or low-fat yogurt. ? Avoid fatty cuts of meat, processed or cured meats, and poultry with skin. Fill about one quarter of your plate with lean proteins, such as fish, chicken without skin, beans, eggs, and tofu. ? Avoid premade and processed foods. These tend to be higher in sodium, added sugar, and fat.  Reduce your daily sodium intake. Most people with hypertension should eat less than 1,500 mg of sodium a day.  Limit alcohol intake to no more than 1 drink a day for nonpregnant women and 2 drinks a day for men. One drink equals 12 oz of beer, 5 oz of wine, or 1 oz of hard liquor. Lifestyle   Work with your health care provider to maintain a healthy body weight or to lose weight. Ask what an ideal weight is for you.  Get at least 30 minutes of exercise that causes your heart to beat faster (aerobic exercise) most days of the week. Activities may include walking, swimming, or biking.  Include exercise to strengthen your muscles (resistance exercise), such as pilates or lifting weights, as part of  your weekly exercise routine. Try to do these types of exercises for 30 minutes at least 3 days a week.  Do not use any products that contain nicotine or tobacco, such as cigarettes and e-cigarettes. If you need help quitting, ask your health care provider.  Monitor your blood pressure at home as told by your health care provider.  Keep all follow-up visits as told by your health care provider. This is important. Medicines  Take over-the-counter and prescription medicines only as told by your health care provider. Follow directions carefully. Blood pressure medicines must be taken as prescribed.  Do not skip doses of blood pressure medicine. Doing this puts you at risk for problems and can make the medicine less effective.  Ask your health care provider about side effects or reactions to medicines that you should watch for. Contact a health care provider if:  You think you are having a reaction to a medicine you are taking.  You have headaches that keep coming back (recurring).  You feel dizzy.  You have swelling in your ankles.  You have  trouble with your vision. Get help right away if:  You develop a severe headache or confusion.  You have unusual weakness or numbness.  You feel faint.  You have severe pain in your chest or abdomen.  You vomit repeatedly.  You have trouble breathing. Summary  Hypertension is when the force of blood pumping through your arteries is too strong. If this condition is not controlled, it may put you at risk for serious complications.  Your personal target blood pressure may vary depending on your medical conditions, your age, and other factors. For most people, a normal blood pressure is less than 120/80.  Hypertension is treated with lifestyle changes, medicines, or a combination of both. Lifestyle changes include weight loss, eating a healthy, low-sodium diet, exercising more, and limiting alcohol. This information is not intended to  replace advice given to you by your health care provider. Make sure you discuss any questions you have with your health care provider. Document Released: 07/06/2005 Document Revised: 06/03/2016 Document Reviewed: 06/03/2016 Elsevier Interactive Patient Education  2019 [employer].

## 2018-10-04 NOTE — Assessment & Plan Note (Signed)
On Levothyroxine, continue to monitor 

## 2018-10-05 NOTE — Assessment & Plan Note (Signed)
Patient encouraged to maintain heart healthy diet, regular exercise, adequate sleep. Consider daily probiotics. Take medications as prescribed 

## 2018-10-21 ENCOUNTER — Ambulatory Visit: Payer: Self-pay | Admitting: *Deleted

## 2018-10-21 NOTE — Telephone Encounter (Signed)
Patient is calling to report he is not feeling well- body aches(joints), slight SOB when walking- cough when expelling air. Patient states he just does not feel quite right. Patient states he is having symptoms he has not had before- he is having dry cough- feels like there is something he needs to get out of throat- but is not coming up yet. [email or web address]   Reason for Disposition . [1] Patient also has allergy symptoms (e.g., itchy eyes, clear nasal discharge, postnasal drip) AND [2] they are acting up    Patient states he is having cough with breathing symptoms- he just doesn't feel right and would like to speak to PCP about this. Patient has history of asthma for childhood to teen- but nothing since. Please schedule video appointment  Answer Assessment - Initial Assessment Questions 1. ONSET: "When did the cough begin?"      Yesterday- last night- patient feels like there is something he has to cough up- but nothing is there 2. SEVERITY: "How bad is the cough today?"      Starts with talking 3. RESPIRATORY DISTRESS: "Describe your breathing."      When takes deep breath- patient feels that there is congestion. Patient does not have SOB- just does not feel right 4. FEVER: "Do you have a fever?" If so, ask: "What is your temperature, how was it measured, and when did it start?"     98.5- today, oral 5. HEMOPTYSIS: "Are you coughing up any blood?" If so ask: "How much?" (flecks, streaks, tablespoons, etc.)     No phlegm at this point 6. TREATMENT: "What have you done so far to treat the cough?" (e.g., meds, fluids, humidifier)     No treatment 7. CARDIAC HISTORY: "Do you have any history of heart disease?" (e.g., heart attack, congestive heart failure)      No- TIA history 2011 8. LUNG HISTORY: "Do you have any history of lung disease?"  (e.g., pulmonary embolus, asthma, emphysema)     Childhood asthma 9. PE RISK FACTORS: "Do you have a history of blood clots?" (or: recent major  surgery, recent prolonged travel, bedridden)     no 10. OTHER SYMPTOMS: "Do you have any other symptoms? (e.g., runny nose, wheezing, chest pain)       no 11. PREGNANCY: "Is there any chance you are pregnant?" "When was your last menstrual period?"       n/a 12. TRAVEL: "Have you traveled out of the country in the last month?" (e.g., travel history, exposures)       No travel, no exposure known  Protocols used: COUGH - ACUTE NON-PRODUCTIVE-A-AH

## 2018-10-22 NOTE — Telephone Encounter (Signed)
Please call and see how he is doing and if he would like a phone or video visit.

## 2018-10-25 NOTE — Telephone Encounter (Signed)
Spoke with patient he states hs is doing well, still have no fever and no SOB at this time. He stated he will call and let us know if he needs anything

## 2018-11-30 ENCOUNTER — Other Ambulatory Visit: Payer: Self-pay | Admitting: Internal Medicine

## 2018-11-30 ENCOUNTER — Other Ambulatory Visit: Payer: Self-pay | Admitting: Family Medicine

## 2018-12-12 ENCOUNTER — Other Ambulatory Visit: Payer: Self-pay | Admitting: Internal Medicine

## 2018-12-14 ENCOUNTER — Telehealth: Payer: Self-pay | Admitting: Internal Medicine

## 2018-12-14 MED ORDER — LINACLOTIDE 145 MCG PO CAPS: ORAL_CAPSULE | ORAL | 1 refills | Status: DC

## 2018-12-14 NOTE — Telephone Encounter (Signed)
Patient called said that [facility] denied Linzess and would like to know why.

## 2018-12-14 NOTE — Telephone Encounter (Signed)
Patient indicates that he actually has medication left but wants to get all of his medications mailed together at the same time and that is why he is asking for Linzess to be mailed early. Will send rx for Linzess now.

## 2019-03-08 ENCOUNTER — Other Ambulatory Visit: Payer: Self-pay | Admitting: Family Medicine

## 2019-03-28 ENCOUNTER — Other Ambulatory Visit: Payer: Self-pay

## 2019-03-28 ENCOUNTER — Other Ambulatory Visit (INDEPENDENT_AMBULATORY_CARE_PROVIDER_SITE_OTHER): Payer: Medicare Other | Admitting: Family Medicine

## 2019-03-28 DIAGNOSIS — E039 Hypothyroidism, unspecified: Secondary | ICD-10-CM | POA: Diagnosis not present

## 2019-03-28 DIAGNOSIS — R739 Hyperglycemia, unspecified: Secondary | ICD-10-CM | POA: Diagnosis not present

## 2019-03-28 DIAGNOSIS — E782 Mixed hyperlipidemia: Secondary | ICD-10-CM

## 2019-03-28 DIAGNOSIS — I10 Essential (primary) hypertension: Secondary | ICD-10-CM

## 2019-03-28 LAB — CBC
HCT: 41.8 % (ref 39.0–52.0)
Hemoglobin: 13.8 g/dL (ref 13.0–17.0)
MCHC: 33.1 g/dL (ref 30.0–36.0)
MCV: 96.3 fl (ref 78.0–100.0)
Platelets: 177 10*3/uL (ref 150.0–400.0)
RBC: 4.34 Mil/uL (ref 4.22–5.81)
RDW: 14.8 % (ref 11.5–15.5)
WBC: 6.5 10*3/uL (ref 4.0–10.5)

## 2019-03-28 LAB — LIPID PANEL
Cholesterol: 139 mg/dL (ref 0–200)
HDL: 51.8 mg/dL (ref >39.00)
LDL Cholesterol: 73 mg/dL (ref 0–99)
NonHDL: 87.04
Total CHOL/HDL Ratio: 3
Triglycerides: 71 mg/dL (ref 0.0–149.0)
VLDL: 14.2 mg/dL (ref 0.0–40.0)

## 2019-03-28 LAB — COMPREHENSIVE METABOLIC PANEL
ALT: 11 U/L (ref 0–53)
AST: 12 U/L (ref 0–37)
Albumin: 4.5 g/dL (ref 3.5–5.2)
Alkaline Phosphatase: 69 U/L (ref 39–117)
BUN: 17 mg/dL (ref 6–23)
CO2: 29 mEq/L (ref 19–32)
Calcium: 9.8 mg/dL (ref 8.4–10.5)
Chloride: 102 mEq/L (ref 96–112)
Creatinine, Ser: 1.17 mg/dL (ref 0.40–1.50)
GFR: 59.65 mL/min — ABNORMAL LOW (ref >60.00)
Glucose, Bld: 92 mg/dL (ref 70–99)
Potassium: 4.8 mEq/L (ref 3.5–5.1)
Sodium: 140 mEq/L (ref 135–145)
Total Bilirubin: 0.9 mg/dL (ref 0.2–1.2)
Total Protein: 7 g/dL (ref 6.0–8.3)

## 2019-03-28 LAB — TSH: TSH: 3.32 u[IU]/mL (ref 0.35–4.50)

## 2019-03-28 LAB — HEMOGLOBIN A1C: Hgb A1c MFr Bld: 5.7 % (ref 4.6–6.5)

## 2019-03-31 ENCOUNTER — Ambulatory Visit (INDEPENDENT_AMBULATORY_CARE_PROVIDER_SITE_OTHER): Payer: Medicare Other | Admitting: Family Medicine

## 2019-03-31 ENCOUNTER — Encounter: Payer: Self-pay | Admitting: Family Medicine

## 2019-03-31 ENCOUNTER — Other Ambulatory Visit: Payer: Self-pay

## 2019-03-31 VITALS — Temp 97.6°F | Wt 211.0 lb

## 2019-03-31 DIAGNOSIS — G4730 Sleep apnea, unspecified: Secondary | ICD-10-CM

## 2019-03-31 DIAGNOSIS — G4733 Obstructive sleep apnea (adult) (pediatric): Secondary | ICD-10-CM | POA: Diagnosis not present

## 2019-03-31 DIAGNOSIS — E039 Hypothyroidism, unspecified: Secondary | ICD-10-CM

## 2019-03-31 DIAGNOSIS — R739 Hyperglycemia, unspecified: Secondary | ICD-10-CM

## 2019-03-31 DIAGNOSIS — I10 Essential (primary) hypertension: Secondary | ICD-10-CM

## 2019-03-31 DIAGNOSIS — Z7189 Other specified counseling: Secondary | ICD-10-CM | POA: Diagnosis not present

## 2019-03-31 DIAGNOSIS — E782 Mixed hyperlipidemia: Secondary | ICD-10-CM

## 2019-04-02 DIAGNOSIS — Z7189 Other specified counseling: Secondary | ICD-10-CM | POA: Insufficient documentation

## 2019-04-02 NOTE — Assessment & Plan Note (Signed)
Tolerating statin, encouraged heart healthy diet, avoid trans fats, minimize simple carbs and saturated fats. Increase exercise as tolerated 

## 2019-04-02 NOTE — Assessment & Plan Note (Signed)
Monitor vitals weekly and report any concerns. no changes to meds. Encouraged heart healthy diet such as the DASH diet and exercise as tolerated.  

## 2019-04-02 NOTE — Assessment & Plan Note (Signed)
The mouth piece has not worked well and he is ready to return to pulmonology for further consideration of CPAP machine.

## 2019-04-02 NOTE — Assessment & Plan Note (Signed)
On Levothyroxine, continue to monitor 

## 2019-04-02 NOTE — Assessment & Plan Note (Signed)
hgba1c acceptable, minimize simple carbs. Increase exercise as tolerated.  

## 2019-04-02 NOTE — Assessment & Plan Note (Signed)
He is in a trial for [place] for one of the Covid vaccines. He has had one of two shots so far

## 2019-04-02 NOTE — Progress Notes (Signed)
Virtual Visit via Video Note  I connected with Tanner [name] on 03/31/19 at  8:40 AM EDT by a video enabled telemedicine application and verified that I am speaking with the correct person using two identifiers.  Location: Patient: home Provider: home   I discussed the limitations of evaluation and management by telemedicine and the availability of in person appointments. The patient expressed understanding and agreed to proceed.    Subjective:    Patient ID: Tanner [name], male    DOB: Oct 26, 1934, 84 y.o.   MRN: [id number]  Chief Complaint  Patient presents with  . Follow-up    routine 6 month follow up, currently participating in a COVID-19 study with [facility]    HPI Patient is in today for follow up on chronic medical concerns including hypertension, hyperlipidemia, hypothyroidism and more. He feels well. No recent febrile illness or hospitalizations. He is in a clinical trial at [facility] to study the [place] he has had one of two shots so far. Denies CP/palp/SOB/HA/congestion/fevers/GI or GU c/o. Taking meds as prescribed  Past Medical History:  Diagnosis Date  . Allergy    seasonal- mostly spring  . Anemia 02/03/2011  . ASTHMA, CHILDHOOD 03/17/2010   mostly as child  . BCC (basal cell carcinoma) 12/08/2011  . CAROTID ARTERY OCCLUSION, WITH INFARCTION 03/17/2010  . CEREBROVASCULAR ACCIDENT 03/17/2010   partial loss of peripheral vision on left-"slight improved"  . CHICKENPOX, HX OF 03/17/2010  . Constipation 12/08/2014  . Epistaxis 09/06/2013  . FATIGUE 03/17/2010  . Hearing loss 11/04/2010   bilateral  . HYPERKALEMIA 05/27/2010  . Hyperlipidemia   . Hypertension   . Hypothyroid 12/08/2011  . Mixed hyperlipidemia 03/17/2010  . Oral lesion 11/20/2014  . Overweight(278.02) 07/01/2010  . Peripheral neuropathy 08/18/2016  . Personal history of other infectious and parasitic disease 03/17/2010  . Preventative health care  08/23/2016  . Skin lesion of right arm 11/20/2014  . Sleep apnea    uses mouthpiece only  . Thrombocytopenia ([place]) 04/05/2012  . TOBACCO ABUSE, HX OF 03/17/2010  . Tubular adenoma of colon 06/10/2011    Past Surgical History:  Procedure Laterality Date  . CAROTID ENDARTERECTOMY Left February 13, 2013   cea  . CATARACT EXTRACTION, BILATERAL Right    Cataract, and stigmatism  . COLONOSCOPY  Dec. 2014  . ENDARTERECTOMY Left 02/13/2013   Procedure: ENDARTERECTOMY CAROTID;  Surgeon: Rosetta Posner, MD;  Location: [facility];  Service: Vascular;  Laterality: Left;  . EUS N/A 08/17/2013   Procedure: UPPER ENDOSCOPIC ULTRASOUND (EUS) LINEAR;  Surgeon: Milus Banister, MD;  Location: [facility];  Service: Endoscopy;  Laterality: N/A;  . knee cartliage repair  1964, 1990   bilateral  . [place]   removed  . ROTATOR CUFF REPAIR     right  . TONSILLECTOMY    . UPPER GI ENDOSCOPY  Jan. 2015    Family History  Problem Relation Age of Onset  . Alzheimer's disease Mother   . Emphysema Sister        cigarettes  . Cancer Sister        lung, tobacco   . Heart disease Son   . Coronary artery disease Brother   . Heart attack Brother   . Other Brother        heart problems- from fathers side  . Cancer Brother 37       lung? smoker  . Heart disease Brother 65  Heart disease before age 25  . Cancer Sister        gyn  . Heart disease Sister   . Bipolar disorder Daughter   . Obesity Daughter   . Stroke Father   . Heart disease Sister     Social History   Socioeconomic History  . Marital status: Divorced    Spouse name: Not on file  . Number of children: 3  . Years of education: 102yrs  . Highest education level: Not on file  Occupational History  . Occupation: retired    Fish farm manager: RETIRED  Social Needs  . Financial resource strain: Not on file  . Food insecurity    Worry: Not on file    Inability: Not on file  . Transportation needs    Medical: Not on file     Non-medical: Not on file  Tobacco Use  . Smoking status: Former Smoker    Packs/day: 2.00    Years: 20.00    Pack years: 40.00    Types: Cigarettes    Quit date: 07/19/1977    Years since quitting: 41.7  . Smokeless tobacco: Never Used  Substance and Sexual Activity  . Alcohol use: Yes    Alcohol/week: 7.0 standard drinks    Types: 7 Glasses of wine per week    Comment: dinner wine daily  . Drug use: No  . Sexual activity: Never  Lifestyle  . Physical activity    Days per week: Not on file    Minutes per session: Not on file  . Stress: Not on file  Relationships  . Social Herbalist on phone: Not on file    Gets together: Not on file    Attends religious service: Not on file    Active member of club or organization: Not on file    Attends meetings of clubs or organizations: Not on file    Relationship status: Not on file  . Intimate partner violence    Fear of current or ex partner: Not on file    Emotionally abused: Not on file    Physically abused: Not on file    Forced sexual activity: Not on file  Other Topics Concern  . Not on file  Social History Narrative   Pt lives at home alone.    Caffeine Use: very little   Heart healthy diet, is planning exercise      Retired from Event organiser    Outpatient Medications Prior to Visit  Medication Sig Dispense Refill  . amLODipine (NORVASC) 2.5 MG tablet TAKE 1 TABLET BY MOUTH 3  TIMES DAILY 270 tablet 1  . aspirin EC 81 MG tablet Take 81 mg by mouth daily.    . chlorthalidone (HYGROTON) 25 MG tablet TAKE 1 TABLET BY MOUTH  EVERY MORNING 90 tablet 2  . Cholecalciferol (VITAMIN D PO) Take 2,000 mg by mouth daily.     . clopidogrel (PLAVIX) 75 MG tablet TAKE 1 TABLET BY MOUTH  DAILY WITH BREAKFAST 90 tablet 1  . Coenzyme Q10 (CO Q-10) 300 MG CAPS Take 1 capsule by mouth daily.    . Glucosamine-Chondroit-Vit C-Mn (GLUCOSAMINE 1500 COMPLEX PO) Take 1,500 mg by mouth daily.     [name] levothyroxine (SYNTHROID) 88 MCG  tablet TAKE 1 TABLET BY MOUTH  DAILY BEFORE BREAKFAST 90 tablet 3  . linaclotide (LINZESS) 145 MCG CAPS capsule TAKE 1 CAPSULE BY MOUTH  DAILY BEFORE BREAKFAST 90 capsule 1  . pantoprazole (PROTONIX) 40 MG tablet  Take 1 tablet (40 mg total) by mouth daily. 90 tablet 3  . ramipril (ALTACE) 10 MG capsule TAKE 1 CAPSULE BY MOUTH  TWICE DAILY 180 capsule 3  . rosuvastatin (CRESTOR) 40 MG tablet TAKE 1 TABLET BY MOUTH  EVERY EVENING 90 tablet 1  . Turmeric (CURCUMIN 95) 500 MG CAPS Take 500 mg by mouth.    [name] b complex vitamins tablet Take 1 tablet by mouth daily.    . magnesium oxide (MAG-OX) 400 MG tablet Take 400 mg by mouth daily.    [name] MEGARED OMEGA-3 KRILL OIL 500 MG CAPS Take 1,000 mg by mouth.    . Probiotic Product (PROBIOTIC PO) Take 1 capsule by mouth daily.      No facility-administered medications prior to visit.     No Known Allergies  Review of Systems  Constitutional: Negative for fever and malaise/fatigue.  HENT: Negative for congestion.   Eyes: Negative for blurred vision.  Respiratory: Negative for shortness of breath.   Cardiovascular: Negative for chest pain, palpitations and leg swelling.  Gastrointestinal: Negative for abdominal pain, blood in stool and nausea.  Genitourinary: Negative for dysuria and frequency.  Musculoskeletal: Negative for falls.  Skin: Negative for rash.  Neurological: Negative for dizziness, loss of consciousness and headaches.  Endo/Heme/Allergies: Negative for environmental allergies.  Psychiatric/Behavioral: Negative for depression. The patient is not nervous/anxious.        Objective:    Physical Exam Constitutional:      Appearance: Normal appearance. He is not ill-appearing.  HENT:     Head: Normocephalic and atraumatic.     Nose: Nose normal.  Eyes:     General:        Right eye: No discharge.        Left eye: No discharge.  Pulmonary:     Effort: Pulmonary effort is normal.  Skin:    General: Skin is dry.  Neurological:      Mental Status: He is alert and oriented to person, place, and time.  Psychiatric:        Mood and Affect: Mood normal.        Behavior: Behavior normal.     Temp 97.6 F (36.4 C) (Oral)   Wt 211 lb (95.7 kg)   BMI 27.09 kg/m  Wt Readings from Last 3 Encounters:  03/31/19 211 lb (95.7 kg)  10/04/18 209 lb 9.6 oz (95.1 kg)  05/03/18 211 lb (95.7 kg)    Diabetic Foot Exam - Simple   No data filed     Lab Results  Component Value Date   WBC 6.5 03/28/2019   HGB 13.8 03/28/2019   HCT 41.8 03/28/2019   PLT 177.0 03/28/2019   GLUCOSE 92 03/28/2019   CHOL 139 03/28/2019   TRIG 71.0 03/28/2019   HDL 51.80 03/28/2019   LDLCALC 73 03/28/2019   ALT 11 03/28/2019   AST 12 03/28/2019   NA 140 03/28/2019   K 4.8 03/28/2019   CL 102 03/28/2019   CREATININE 1.17 03/28/2019   BUN 17 03/28/2019   CO2 29 03/28/2019   TSH 3.32 03/28/2019   PSA 0.39 05/08/2014   INR 0.99 02/07/2013   HGBA1C 5.7 03/28/2019   MICROALBUR 2.0 (H) 12/03/2015    Lab Results  Component Value Date   TSH 3.32 03/28/2019   Lab Results  Component Value Date   WBC 6.5 03/28/2019   HGB 13.8 03/28/2019   HCT 41.8 03/28/2019   MCV 96.3 03/28/2019   PLT 177.0 03/28/2019  Lab Results  Component Value Date   NA 140 03/28/2019   K 4.8 03/28/2019   CO2 29 03/28/2019   GLUCOSE 92 03/28/2019   BUN 17 03/28/2019   CREATININE 1.17 03/28/2019   BILITOT 0.9 03/28/2019   ALKPHOS 69 03/28/2019   AST 12 03/28/2019   ALT 11 03/28/2019   PROT 7.0 03/28/2019   ALBUMIN 4.5 03/28/2019   CALCIUM 9.8 03/28/2019   GFR 59.65 (L) 03/28/2019   Lab Results  Component Value Date   CHOL 139 03/28/2019   Lab Results  Component Value Date   HDL 51.80 03/28/2019   Lab Results  Component Value Date   LDLCALC 73 03/28/2019   Lab Results  Component Value Date   TRIG 71.0 03/28/2019   Lab Results  Component Value Date   CHOLHDL 3 03/28/2019   Lab Results  Component Value Date   HGBA1C 5.7 03/28/2019        Assessment & Plan:   Problem List Items Addressed This Visit    Hypothyroid (Chronic)    On Levothyroxine, continue to monitor      Mixed hyperlipidemia    Tolerating statin, encouraged heart healthy diet, avoid trans fats, minimize simple carbs and saturated fats. Increase exercise as tolerated      Essential hypertension, benign    Monitor vitals weekly and report any concerns. no changes to meds. Encouraged heart healthy diet such as the DASH diet and exercise as tolerated.       OSA (obstructive sleep apnea)    The mouth piece has not worked well and he is ready to return to pulmonology for further consideration of CPAP machine.       Hyperglycemia    hgba1c acceptable, minimize simple carbs. Increase exercise as tolerated.       Educated About Covid-19 Virus Infection    He is in a trial for [place] for one of the Covid vaccines. He has had one of two shots so far       Other Visit Diagnoses    Sleep apnea, unspecified type    -  Primary   Relevant Orders   Ambulatory referral to Pulmonology      I am having Tanner "Phil" maintain his Co Q-10, Cholecalciferol (VITAMIN D PO), b complex vitamins, Probiotic Product (PROBIOTIC PO), aspirin EC, Glucosamine-Chondroit-Vit C-Mn (GLUCOSAMINE 1500 COMPLEX PO), magnesium oxide, Turmeric, MegaRed Omega-3 Krill Oil, pantoprazole, clopidogrel, chlorthalidone, amLODipine, rosuvastatin, linaclotide, levothyroxine, and ramipril.  No orders of the defined types were placed in this encounter.    I discussed the assessment and treatment plan with the patient. The patient was provided an opportunity to ask questions and all were answered. The patient agreed with the plan and demonstrated an understanding of the instructions.   The patient was advised to call back or seek an in-person evaluation if the symptoms worsen or if the condition fails to improve as anticipated.  I provided 25 minutes of non-face-to-face time  during this encounter.   Penni Homans, MD

## 2019-04-04 ENCOUNTER — Ambulatory Visit: Payer: Medicare Other | Admitting: Family Medicine

## 2019-04-04 ENCOUNTER — Encounter: Payer: Self-pay | Admitting: Family Medicine

## 2019-04-25 ENCOUNTER — Other Ambulatory Visit: Payer: Self-pay

## 2019-04-25 ENCOUNTER — Ambulatory Visit (INDEPENDENT_AMBULATORY_CARE_PROVIDER_SITE_OTHER): Payer: Medicare Other | Admitting: Family Medicine

## 2019-04-25 ENCOUNTER — Telehealth: Payer: Self-pay | Admitting: *Deleted

## 2019-04-25 VITALS — BP 126/70

## 2019-04-25 DIAGNOSIS — Z23 Encounter for immunization: Secondary | ICD-10-CM

## 2019-04-25 DIAGNOSIS — I10 Essential (primary) hypertension: Secondary | ICD-10-CM

## 2019-04-25 DIAGNOSIS — E782 Mixed hyperlipidemia: Secondary | ICD-10-CM

## 2019-04-25 DIAGNOSIS — E039 Hypothyroidism, unspecified: Secondary | ICD-10-CM

## 2019-04-25 DIAGNOSIS — R739 Hyperglycemia, unspecified: Secondary | ICD-10-CM

## 2019-04-25 NOTE — Telephone Encounter (Signed)
Error

## 2019-04-25 NOTE — Progress Notes (Signed)
Patient here today for flu vaccine and verbal order for blood pressure check per Dr. [name]  Patient brought in own blood pressure cuff.  He states that his blood pressures usually run high at home 140s over 80s.  He did get a new machine that was recommended by Dr. [name], [employer].  Today with his blood pressure.  We did check twice. First blood pressure was 910am 139/85 pulse 85  Second blood pressure was 920am 140/80.   Blood pressure was checked with our cuff manually.  915am 118/70 930am 126/70  He stated that his numbers on his machine was what he usually get at home but a little better.   Also he stated that when he checks his pulse on his wrist that her feels an extra beat.  When checking manually I also heard and extra beat.  Flu vaccine given in right deltoid and patient tolerated well.  Plan: Per Dr. [name] set up appointment in 1 month or just monitor occasionally and recheck at appointment in March for extra heart beat.  Patient will recheck in one month.  Appointment made.

## 2019-04-26 ENCOUNTER — Other Ambulatory Visit: Payer: Self-pay | Admitting: Family Medicine

## 2019-05-17 ENCOUNTER — Ambulatory Visit: Payer: Medicare Other | Admitting: Pulmonary Disease

## 2019-05-17 ENCOUNTER — Encounter: Payer: Self-pay | Admitting: Pulmonary Disease

## 2019-05-17 ENCOUNTER — Other Ambulatory Visit: Payer: Self-pay

## 2019-05-17 VITALS — BP 122/80 | HR 85 | Temp 97.1°F | Ht 74.0 in | Wt 206.0 lb

## 2019-05-17 DIAGNOSIS — I6521 Occlusion and stenosis of right carotid artery: Secondary | ICD-10-CM

## 2019-05-17 DIAGNOSIS — G4733 Obstructive sleep apnea (adult) (pediatric): Secondary | ICD-10-CM

## 2019-05-17 DIAGNOSIS — I6522 Occlusion and stenosis of left carotid artery: Secondary | ICD-10-CM

## 2019-05-17 NOTE — Patient Instructions (Signed)
We will schedule you for a home sleep study  We will contact Dr. Ron Parker office to obtain information about your recent studies  I will see you back in about 6 weeks  Treatment options for sleep disordered breathing as discussed  Call with significant concerns

## 2019-05-17 NOTE — Progress Notes (Signed)
Tanner [name]    [id number]    1934-08-14  Primary Care Physician:Blyth, Bonnita Levan, MD  Referring Physician: [name], MD [place],  [place] [address]  Chief complaint:   Patient with a history of obstructive sleep apnea diagnosed with mild obstructive sleep apnea in 2014  HPI:  He has been using an oral device since 2014 Device was upgraded about 4 years after initial start date He has had multiple evaluations recently with concerns that the oral device was not working to treat his sleep disordered breathing He stated he has had at least 4 studies with the oral device in place Dr. Ron Parker did get concerned about the device not been very efficient in treating his sleep disordered breathing  He has a history of CVAs, coronary artery disease  Usually goes to bed between 10 and 11:30 PM, takes him about 20 minutes to fall asleep Wakes up about 2-4 times during the night Final wake up time between 7 and 8 AM  He is concerned he may not be able to tolerate CPAP therapy   Outpatient Encounter Medications as of 05/17/2019  Medication Sig  . amLODipine (NORVASC) 2.5 MG tablet TAKE 1 TABLET BY MOUTH 3  TIMES DAILY  . aspirin EC 81 MG tablet Take 81 mg by mouth daily.  [name] b complex vitamins tablet Take 1 tablet by mouth daily.  . chlorthalidone (HYGROTON) 25 MG tablet TAKE 1 TABLET BY MOUTH  EVERY MORNING  . Cholecalciferol (VITAMIN D PO) Take 2,000 mg by mouth daily.   . clopidogrel (PLAVIX) 75 MG tablet TAKE 1 TABLET BY MOUTH  DAILY WITH BREAKFAST  . Coenzyme Q10 (CO Q-10) 300 MG CAPS Take 1 capsule by mouth daily.  . Glucosamine-Chondroit-Vit C-Mn (GLUCOSAMINE 1500 COMPLEX PO) Take 1,500 mg by mouth daily.   [name] levothyroxine (SYNTHROID) 88 MCG tablet TAKE 1 TABLET BY MOUTH  DAILY BEFORE BREAKFAST  . linaclotide (LINZESS) 145 MCG CAPS capsule TAKE 1 CAPSULE BY MOUTH  DAILY BEFORE BREAKFAST  . magnesium oxide (MAG-OX) 400 MG tablet Take 400  mg by mouth daily.  [name] MEGARED OMEGA-3 KRILL OIL 500 MG CAPS Take 1,000 mg by mouth.  . pantoprazole (PROTONIX) 40 MG tablet TAKE 1 TABLET BY MOUTH  DAILY  . Probiotic Product (PROBIOTIC PO) Take 1 capsule by mouth daily.   . ramipril (ALTACE) 10 MG capsule TAKE 1 CAPSULE BY MOUTH  TWICE DAILY  . rosuvastatin (CRESTOR) 40 MG tablet TAKE 1 TABLET BY MOUTH IN  THE EVENING  . Turmeric (CURCUMIN 95) 500 MG CAPS Take 500 mg by mouth.   No facility-administered encounter medications on file as of 05/17/2019.     Allergies as of 05/17/2019  . (No Known Allergies)    Past Medical History:  Diagnosis Date  . Allergy    seasonal- mostly spring  . Anemia 02/03/2011  . ASTHMA, CHILDHOOD 03/17/2010   mostly as child  . BCC (basal cell carcinoma) 12/08/2011  . CAROTID ARTERY OCCLUSION, WITH INFARCTION 03/17/2010  . CEREBROVASCULAR ACCIDENT 03/17/2010   partial loss of peripheral vision on left-"slight improved"  . CHICKENPOX, HX OF 03/17/2010  . Constipation 12/08/2014  . Epistaxis 09/06/2013  . FATIGUE 03/17/2010  . Hearing loss 11/04/2010   bilateral  . HYPERKALEMIA 05/27/2010  . Hyperlipidemia   . Hypertension   . Hypothyroid 12/08/2011  . Mixed hyperlipidemia 03/17/2010  . Oral lesion 11/20/2014  . Overweight(278.02) 07/01/2010  .  Peripheral neuropathy 08/18/2016  . Personal history of other infectious and parasitic disease 03/17/2010  . Preventative health care 08/23/2016  . Skin lesion of right arm 11/20/2014  . Sleep apnea    uses mouthpiece only  . Thrombocytopenia ([place]) 04/05/2012  . TOBACCO ABUSE, HX OF 03/17/2010  . Tubular adenoma of colon 06/10/2011    Past Surgical History:  Procedure Laterality Date  . CAROTID ENDARTERECTOMY Left February 13, 2013   cea  . CATARACT EXTRACTION, BILATERAL Right    Cataract, and stigmatism  . COLONOSCOPY  Dec. 2014  . ENDARTERECTOMY Left 02/13/2013   Procedure: ENDARTERECTOMY CAROTID;  Surgeon: Rosetta Posner, MD;  Location: [facility];  Service: Vascular;   Laterality: Left;  . EUS N/A 08/17/2013   Procedure: UPPER ENDOSCOPIC ULTRASOUND (EUS) LINEAR;  Surgeon: Milus Banister, MD;  [facility];  Service: Endoscopy;  Laterality: N/A;  . knee cartliage repair  1964, 1990   bilateral  . [place]   removed  . ROTATOR CUFF REPAIR     right  . TONSILLECTOMY    . UPPER GI ENDOSCOPY  Jan. 2015    Family History  Problem Relation Age of Onset  . Alzheimer's disease Mother   . Emphysema Sister        cigarettes  . Cancer Sister        lung, tobacco   . Heart disease Son   . Coronary artery disease Brother   . Heart attack Brother   . Other Brother        heart problems- from fathers side  . Cancer Brother 61       lung? smoker  . Heart disease Brother 33       Heart disease before age 41  . Cancer Sister        gyn  . Heart disease Sister   . Bipolar disorder Daughter   . Obesity Daughter   . Stroke Father   . Heart disease Sister     Social History   Socioeconomic History  . Marital status: Divorced    Spouse name: Not on file  . Number of children: 3  . Years of education: 56yrs  . Highest education level: Not on file  Occupational History  . Occupation: retired    Fish farm manager: RETIRED  Social Needs  . Financial resource strain: Not on file  . Food insecurity    Worry: Not on file    Inability: Not on file  . Transportation needs    Medical: Not on file    Non-medical: Not on file  Tobacco Use  . Smoking status: Former Smoker    Packs/day: 2.00    Years: 20.00    Pack years: 40.00    Types: Cigarettes    Quit date: 07/19/1977    Years since quitting: 41.8  . Smokeless tobacco: Never Used  Substance and Sexual Activity  . Alcohol use: Yes    Alcohol/week: 7.0 standard drinks    Types: 7 Glasses of wine per week    Comment: dinner wine daily  . Drug use: No  . Sexual activity: Never  Lifestyle  . Physical activity    Days per week: Not on file    Minutes per session: Not on  file  . Stress: Not on file  Relationships  . Social Herbalist on phone: Not on file    Gets together: Not on file    Attends religious service: Not on  file    Active member of club or organization: Not on file    Attends meetings of clubs or organizations: Not on file    Relationship status: Not on file  . Intimate partner violence    Fear of current or ex partner: Not on file    Emotionally abused: Not on file    Physically abused: Not on file    Forced sexual activity: Not on file  Other Topics Concern  . Not on file  Social History Narrative   Pt lives at home alone.    Caffeine Use: very little   Heart healthy diet, is planning exercise      Retired from Event organiser    Review of Systems  Constitutional: Negative for fatigue.  HENT: Negative.   Respiratory: Positive for apnea.   Cardiovascular: Negative.   Gastrointestinal: Negative.   Psychiatric/Behavioral: Positive for sleep disturbance.    Vitals:   05/17/19 1545  BP: 122/80  Pulse: 85  Temp: (!) 97.1 F (36.2 C)  SpO2: 99%     Physical Exam  Constitutional: He is oriented to person, place, and time. He appears well-developed and well-nourished.  HENT:  Head: Normocephalic and atraumatic.  Crowded oropharynx, Mallampati 4  Eyes: Pupils are equal, round, and reactive to light. Right eye exhibits no discharge. Left eye exhibits no discharge.  Neck: Normal range of motion. Neck supple. No tracheal deviation present. No thyromegaly present.  Cardiovascular: Normal rate and regular rhythm.  Pulmonary/Chest: Effort normal and breath sounds normal. No respiratory distress. He has no wheezes. He has no rales.  Abdominal: Soft. Bowel sounds are normal. He exhibits no distension. There is no abdominal tenderness.  Musculoskeletal: Normal range of motion.        General: No edema.  Neurological: He is alert and oriented to person, place, and time. No cranial nerve deficit.  Skin: Skin is warm and  dry. No erythema.  Psychiatric: He has a normal mood and affect.   Results of the [place] flowsheet 05/17/2019  Sitting and reading 2  Watching TV 1  Sitting, inactive in a public place (e.g. a theatre or a meeting) 0  As a passenger in a car for an hour without a break 1  Lying down to rest in the afternoon when circumstances permit 1  Sitting and talking to someone 0  Sitting quietly after a lunch without alcohol 0  In a car, while stopped for a few minutes in traffic 0  Total score 5     Data Reviewed: Previous sleep study from 2014 reviewed showing mild obstructive sleep apnea  Assessment:  Mild obstructive sleep apnea -Not adequately being treated with an oral device -Failing oral device at present  Pathophysiology of sleep disordered breathing discussed with the patient  Treatment options for sleep disordered breathing discussed with the patient  History of coronary artery disease  History of CVA  Plan/Recommendations:  I will schedule the patient for a home sleep study  I will see him back in the office in about 6 weeks  Treatment options including surgery-palatal surgery, tongue surgery or tongue reduction interventions, mandibular advancement procedures, tracheostomy.  Inspire pacemaker Discussed  Sherrilyn Rist MD [facility] 05/17/2019, 4:25 PM  CC: [name], MD

## 2019-05-22 ENCOUNTER — Telehealth (HOSPITAL_COMMUNITY): Payer: Self-pay

## 2019-05-22 NOTE — Telephone Encounter (Signed)

## 2019-05-23 ENCOUNTER — Ambulatory Visit (HOSPITAL_COMMUNITY)
Admission: RE | Admit: 2019-05-23 | Discharge: 2019-05-23 | Disposition: A | Discharge status: Home / Self Care | Payer: Medicare Other | Source: Ambulatory Visit | Attending: Family | Admitting: Family

## 2019-05-23 ENCOUNTER — Ambulatory Visit: Payer: Medicare Other | Admitting: Vascular Surgery

## 2019-05-23 ENCOUNTER — Other Ambulatory Visit: Payer: Self-pay

## 2019-05-23 ENCOUNTER — Encounter: Payer: Self-pay | Admitting: Vascular Surgery

## 2019-05-23 VITALS — BP 136/85 | HR 65 | Temp 97.4°F | Resp 20 | Ht 74.0 in | Wt 205.8 lb

## 2019-05-23 DIAGNOSIS — Z9889 Other specified postprocedural states: Secondary | ICD-10-CM | POA: Diagnosis not present

## 2019-05-23 DIAGNOSIS — I6521 Occlusion and stenosis of right carotid artery: Secondary | ICD-10-CM | POA: Diagnosis present

## 2019-05-23 DIAGNOSIS — I6522 Occlusion and stenosis of left carotid artery: Secondary | ICD-10-CM | POA: Diagnosis present

## 2019-05-23 NOTE — Progress Notes (Signed)
[facility] [place]  Patient name: Tanner [name] MRN: [id number] DOB: March 27, 1935 Sex: male  REASON FOR VISIT: Follow-up carotid disease  HPI: Tanner [name] is a 84 y.o. male known to me from prior left carotid endarterectomy in 2014.  He continues to be quite active at his age of 80.  He has no neurologic deficits.  He does report sensation of occasional irregular heart rhythm but is not symptomatic from this.  He has a daily walking program and notes that his heart rate does go in the110-120 with walking.  Past Medical History:  Diagnosis Date   Allergy    seasonal- mostly spring   Anemia 02/03/2011   ASTHMA, CHILDHOOD 03/17/2010   mostly as child   BCC (basal cell carcinoma) 12/08/2011   CAROTID ARTERY OCCLUSION, WITH INFARCTION 03/17/2010   CEREBROVASCULAR ACCIDENT 03/17/2010   partial loss of peripheral vision on left-"slight improved"   CHICKENPOX, HX OF 03/17/2010   Constipation 12/08/2014   Epistaxis 09/06/2013   FATIGUE 03/17/2010   Hearing loss 11/04/2010   bilateral   HYPERKALEMIA 05/27/2010   Hyperlipidemia    Hypertension    Hypothyroid 12/08/2011   Mixed hyperlipidemia 03/17/2010   Oral lesion 11/20/2014   Overweight(278.02) 07/01/2010   Peripheral neuropathy 08/18/2016   Personal history of other infectious and parasitic disease 03/17/2010   Preventative health care 08/23/2016   Skin lesion of right arm 11/20/2014   Sleep apnea    uses mouthpiece only   Thrombocytopenia ([place]) 04/05/2012   TOBACCO ABUSE, HX OF 03/17/2010   Tubular adenoma of colon 06/10/2011    Family History  Problem Relation Age of Onset   Alzheimer's disease Mother    Emphysema Sister        cigarettes   Cancer Sister        lung, tobacco    Heart disease Son    Coronary artery disease Brother    Heart attack Brother    Other Brother        heart problems- from fathers side   Cancer Brother 59   lung? smoker   Heart disease Brother 20       Heart disease before age 11   Cancer Sister        gyn   Heart disease Sister    Bipolar disorder Daughter    Obesity Daughter    Stroke Father    Heart disease Sister     SOCIAL HISTORY: Social History   Tobacco Use   Smoking status: Former Smoker    Packs/day: 2.00    Years: 20.00    Pack years: 40.00    Types: Cigarettes    Quit date: 07/19/1977    Years since quitting: 41.8   Smokeless tobacco: Never Used  Substance Use Topics   Alcohol use: Yes    Alcohol/week: 7.0 standard drinks    Types: 7 Glasses of wine per week    Comment: dinner wine daily    No Known Allergies  Current Outpatient Medications  Medication Sig Dispense Refill   amLODipine (NORVASC) 2.5 MG tablet TAKE 1 TABLET BY MOUTH 3  TIMES DAILY 270 tablet 3   aspirin EC 81 MG tablet Take 81 mg by mouth daily.     b complex vitamins tablet Take 1 tablet by mouth daily.     chlorthalidone (HYGROTON) 25 MG tablet TAKE 1 TABLET BY MOUTH  EVERY MORNING 90 tablet 2   Cholecalciferol (VITAMIN D PO) Take 2,000 mg by  mouth daily.      clopidogrel (PLAVIX) 75 MG tablet TAKE 1 TABLET BY MOUTH  DAILY WITH BREAKFAST 90 tablet 3   Coenzyme Q10 (CO Q-10) 300 MG CAPS Take 1 capsule by mouth daily.     Glucosamine-Chondroit-Vit C-Mn (GLUCOSAMINE 1500 COMPLEX PO) Take 1,500 mg by mouth daily.      levothyroxine (SYNTHROID) 88 MCG tablet TAKE 1 TABLET BY MOUTH  DAILY BEFORE BREAKFAST 90 tablet 3   linaclotide (LINZESS) 145 MCG CAPS capsule TAKE 1 CAPSULE BY MOUTH  DAILY BEFORE BREAKFAST 90 capsule 1   magnesium oxide (MAG-OX) 400 MG tablet Take 400 mg by mouth daily.     MEGARED OMEGA-3 KRILL OIL 500 MG CAPS Take 1,000 mg by mouth.     pantoprazole (PROTONIX) 40 MG tablet TAKE 1 TABLET BY MOUTH  DAILY 90 tablet 3   Probiotic Product (PROBIOTIC PO) Take 1 capsule by mouth daily.      ramipril (ALTACE) 10 MG capsule TAKE 1 CAPSULE BY MOUTH  TWICE DAILY  180 capsule 3   rosuvastatin (CRESTOR) 40 MG tablet TAKE 1 TABLET BY MOUTH IN  THE EVENING 90 tablet 3   Turmeric (CURCUMIN 95) 500 MG CAPS Take 500 mg by mouth.     No current facility-administered medications for this visit.     REVIEW OF SYSTEMS:  [X]  denotes positive finding, [ ]  denotes negative finding Cardiac  Comments:  Chest pain or chest pressure:    Shortness of breath upon exertion:    Short of breath when lying flat:    Irregular heart rhythm: x       Vascular    Pain in calf, thigh, or hip brought on by ambulation:    Pain in feet at night that wakes you up from your sleep:     Blood clot in your veins:    Leg swelling:           PHYSICAL EXAM: Vitals:   05/23/19 1000 05/23/19 1004  BP: 129/73 136/85  Pulse: 65   Resp: 20   Temp: (!) 97.4 F (36.3 C)   SpO2: 100%   Weight: 205 lb 12.8 oz (93.4 kg)   Height: 6\' 2"  (1.88 m)     GENERAL: The patient is a well-nourished male, in no acute distress. The vital signs are documented above. CARDIOVASCULAR: Well-healed left carotid incision with no bruits bilaterally.  2+ radial pulses. PULMONARY: There is good air exchange  MUSCULOSKELETAL: There are no major deformities or cyanosis. NEUROLOGIC: No focal weakness or paresthesias are detected. SKIN: There are no ulcers or rashes noted. PSYCHIATRIC: The patient has a normal affect.  DATA:  Carotid duplex reveals occlusion of his internal carotid on the right.  There does appear to be some recanalization of his common carotid and external carotid.  His left endarterectomy is widely patent with no stenosis  MEDICAL ISSUES: Continued stable 6 years status post left carotid endarterectomy with known right carotid occlusion.  We will continue with full activities without limitation.  He will see [place] again in 1 year with repeat carotid duplex    Rosetta Posner, MD [facility] Tel [phone] Pager [phone]

## 2019-05-25 ENCOUNTER — Other Ambulatory Visit: Payer: Self-pay

## 2019-05-25 DIAGNOSIS — I6522 Occlusion and stenosis of left carotid artery: Secondary | ICD-10-CM

## 2019-05-25 NOTE — Progress Notes (Signed)
NEUROLOGY FOLLOW UP OFFICE NOTE  STANCIL COELHO [id number]  HISTORY OF PRESENT ILLNESS: Tanner [name] is an 84 year old left-handed male with hypertension, hyperlipidemia, hypothyroidism, OSA and former smoker who follows up for right MCA/PCA watershed infarct, carotid artery disease with right ICA occlusion and status post left CEA, and peripheral neuropathy.  UPDATE: 03/28/19:  LDL 73, Hgb A1c 5.7 He is taking aspirin 81 mg and Plavix 75 mg daily for secondary stroke prevention.  He is taking Crestor 40 mg daily. 05/23/19:  Carotid doppler:  Recanalization of the right CCA, ECA and retrograde via ECA recanalization of the proximal right ICA.  Patent left carotid endarterectomy site with no evidence of restenosis.  Bilateral vertebral arteries with antegrade flow.  His HR at rest is usually 60 bpm.  Within the last few months, there have been times when he feels "worn out", even if he has not exerted himself.  He walks about 2 miles a day.   While walking and looks at his fitbit and his HR up to 115 bpm.  He denies feeling palpitations.  If he takes his pulse, he notes irregularity in his heart beat.  He is scheduled for an EKG.  He has noted that the mouth piece for his CPAP has not been working.  He is supposed to have a repeat sleep study.  HISTORY: Stroke:  In August 2011, he had a stroke, presenting as left visual field cut.He had a right hemispheric stroke thought to be secondary to right ICA occlusion.MRI of brain revealed an acute watershed infarct in the right PCA/MCA distributions.CTA of the neck confirmed right common carotid artery occlusion at the origin.The left ICA revealed approximately 50% stenosis just distal to the bulb.CTA of the head revealed occluded right ICA with partial retrograde constitution from the left INCA via the PCA.There was opacification of the right MCA and right ACA from the left ICA via the Acomm.There was occlusion of the right PCA at  its origin.Hgb A1c at that time was 5.6 and LDL was 210.  He was placed on Plavix.Follow up carotid dopplers revealed increased stenosis of the left ICA.Carotid doppler from 01/03/13 revealed more than 70% stenosis involving the left bulb and left proximal ICA and 50-69% stenosis involving the left mid ICA, but without plaque.Stenosis of left ECA and left VA noted.CTA of the neck performed on 01/19/13 revealed 75-80% stenosis of the left ICA.He underwent a left CEA on 02/13/13.  04/02/15 Carotid doppler: Right ICA occluded. Left ICA patent. No plaque.09/28/13 Hgb A1c 5.7 02/07/13 2D Echo:LVEF 55-65%, no regional wall motion abnormalities, mild MVR, mildly dilated left atrium, mildly dilated right ventricle, mildly increased systolic pressure in pulmonary arteries (PA peak pressure 46mm Hg). He followed up with vascular surgery on 04/07/16. Carotid doppler performed then again revealed known right ICA occlusion with patent left carotid endarterectomy site, not significantly changed compared to last exam.  Neuropathy:  He reports numbness on the bottom of his feet, mostly his toes. He also has a discomfort involving the left shin, but it is not a pain. He says she sometimes catches his toe of his left foot on the ground and may stumble. This has been ongoing since his stroke. He also reports having fractured his left ankle twice. He does report crossing his legs. He notes muscle cramps in the legs, which bother him at night. NCV-EMG from 04/28/16 revealed chronic symmetric sensorimotor polyneuropathy. Neuropathy labs include: ANA negative, Sed Rate 3, B6 43.8, SPEP/IFE/UPEP negative, B12 855.  PAST  MEDICAL HISTORY: Past Medical History:  Diagnosis Date   Allergy    seasonal- mostly spring   Anemia 02/03/2011   ASTHMA, CHILDHOOD 03/17/2010   mostly as child   BCC (basal cell carcinoma) 12/08/2011   CAROTID ARTERY OCCLUSION, WITH INFARCTION 03/17/2010   CEREBROVASCULAR  ACCIDENT 03/17/2010   partial loss of peripheral vision on left-"slight improved"   CHICKENPOX, HX OF 03/17/2010   Constipation 12/08/2014   Epistaxis 09/06/2013   FATIGUE 03/17/2010   Hearing loss 11/04/2010   bilateral   HYPERKALEMIA 05/27/2010   Hyperlipidemia    Hypertension    Hypothyroid 12/08/2011   Mixed hyperlipidemia 03/17/2010   Oral lesion 11/20/2014   Overweight(278.02) 07/01/2010   Peripheral neuropathy 08/18/2016   Personal history of other infectious and parasitic disease 03/17/2010   Preventative health care 08/23/2016   Skin lesion of right arm 11/20/2014   Sleep apnea    uses mouthpiece only   Thrombocytopenia ([place]) 04/05/2012   TOBACCO ABUSE, HX OF 03/17/2010   Tubular adenoma of colon 06/10/2011    MEDICATIONS: Current Outpatient Medications on File Prior to Visit  Medication Sig Dispense Refill   amLODipine (NORVASC) 2.5 MG tablet TAKE 1 TABLET BY MOUTH 3  TIMES DAILY 270 tablet 3   aspirin EC 81 MG tablet Take 81 mg by mouth daily.     b complex vitamins tablet Take 1 tablet by mouth daily.     chlorthalidone (HYGROTON) 25 MG tablet TAKE 1 TABLET BY MOUTH  EVERY MORNING 90 tablet 2   Cholecalciferol (VITAMIN D PO) Take 2,000 mg by mouth daily.      clopidogrel (PLAVIX) 75 MG tablet TAKE 1 TABLET BY MOUTH  DAILY WITH BREAKFAST 90 tablet 3   Coenzyme Q10 (CO Q-10) 300 MG CAPS Take 1 capsule by mouth daily.     Glucosamine-Chondroit-Vit C-Mn (GLUCOSAMINE 1500 COMPLEX PO) Take 1,500 mg by mouth daily.      levothyroxine (SYNTHROID) 88 MCG tablet TAKE 1 TABLET BY MOUTH  DAILY BEFORE BREAKFAST 90 tablet 3   linaclotide (LINZESS) 145 MCG CAPS capsule TAKE 1 CAPSULE BY MOUTH  DAILY BEFORE BREAKFAST 90 capsule 1   magnesium oxide (MAG-OX) 400 MG tablet Take 400 mg by mouth daily.     MEGARED OMEGA-3 KRILL OIL 500 MG CAPS Take 1,000 mg by mouth.     pantoprazole (PROTONIX) 40 MG tablet TAKE 1 TABLET BY MOUTH  DAILY 90 tablet 3   Probiotic Product  (PROBIOTIC PO) Take 1 capsule by mouth daily.      ramipril (ALTACE) 10 MG capsule TAKE 1 CAPSULE BY MOUTH  TWICE DAILY 180 capsule 3   rosuvastatin (CRESTOR) 40 MG tablet TAKE 1 TABLET BY MOUTH IN  THE EVENING 90 tablet 3   Turmeric (CURCUMIN 95) 500 MG CAPS Take 500 mg by mouth.     No current facility-administered medications on file prior to visit.     ALLERGIES: No Known Allergies  FAMILY HISTORY: Family History  Problem Relation Age of Onset   Alzheimer's disease Mother    Emphysema Sister        cigarettes   Cancer Sister        lung, tobacco    Heart disease Son    Coronary artery disease Brother    Heart attack Brother    Other Brother        heart problems- from fathers side   Cancer Brother 66       lung? smoker   Heart disease Brother 88  Heart disease before age 67   Cancer Sister        gyn   Heart disease Sister    Bipolar disorder Daughter    Obesity Daughter    Stroke Father    Heart disease Sister    SOCIAL HISTORY: Social History   Socioeconomic History   Marital status: Divorced    Spouse name: Not on file   Number of children: 3   Years of education: 84yrs   Highest education level: Not on file  Occupational History   Occupation: retired    Fish farm manager: RETIRED  Scientist, product/process development strain: Not on file   Food insecurity    Worry: Not on file    Inability: Not on file   Transportation needs    Medical: Not on file    Non-medical: Not on file  Tobacco Use   Smoking status: Former Smoker    Packs/day: 2.00    Years: 20.00    Pack years: 40.00    Types: Cigarettes    Quit date: 07/19/1977    Years since quitting: 41.8   Smokeless tobacco: Never Used  Substance and Sexual Activity   Alcohol use: Yes    Alcohol/week: 7.0 standard drinks    Types: 7 Glasses of wine per week    Comment: dinner wine daily   Drug use: No   Sexual activity: Never  Lifestyle   Physical activity    Days  per week: Not on file    Minutes per session: Not on file   Stress: Not on file  Relationships   Social connections    Talks on phone: Not on file    Gets together: Not on file    Attends religious service: Not on file    Active member of club or organization: Not on file    Attends meetings of clubs or organizations: Not on file    Relationship status: Not on file   Intimate partner violence    Fear of current or ex partner: Not on file    Emotionally abused: Not on file    Physically abused: Not on file    Forced sexual activity: Not on file  Other Topics Concern   Not on file  Social History Narrative   Pt lives at home alone.    Caffeine Use: very little   Heart healthy diet, is planning exercise      Retired from Event organiser    REVIEW OF SYSTEMS: Constitutional: No fevers, chills, or sweats, no generalized fatigue, change in appetite Eyes: No visual changes, double vision, eye pain Ear, nose and throat: No hearing loss, ear pain, nasal congestion, sore throat Cardiovascular: No chest pain, palpitations Respiratory:  No shortness of breath at rest or with exertion, wheezes GastrointestinaI: No nausea, vomiting, diarrhea, abdominal pain, fecal incontinence Genitourinary:  No dysuria, urinary retention or frequency Musculoskeletal:  No neck pain, back pain Integumentary: No rash, pruritus, skin lesions Neurological: as above Psychiatric: No depression, insomnia, anxiety Endocrine: No palpitations, fatigue, diaphoresis, mood swings, change in appetite, change in weight, increased thirst Hematologic/Lymphatic:  No purpura, petechiae. Allergic/Immunologic: no itchy/runny eyes, nasal congestion, recent allergic reactions, rashes  PHYSICAL EXAM: Blood pressure 135/78, pulse 70, height 6\' 2"  (1.88 m), weight 205 lb 6.4 oz (93.2 kg), SpO2 100 %. General: No acute distress.  Patient appears well-groomed.   Head:  Normocephalic/atraumatic Eyes:  Fundi examined but not  visualized Neck: supple, no paraspinal tenderness, full range of motion Heart:  Regular rate, irregluar rhythm Lungs:  Clear to auscultation bilaterally Back: No paraspinal tenderness Neurological Exam: alert and oriented to person, place, and time. Attention span and concentration intact, recent and remote memory intact, fund of knowledge intact.  Speech fluent and not dysarthric, language intact.  Left homonymous hemianopsia.  Otherwise, CN II-XII intact. Bulk and tone normal, muscle strength 5/5 throughout.  Sensation to temperature intact and vibration reduced up to below knees.  Deep tendon reflexes 2+ throughout, toes downgoing.  Finger to nose and heel to shin testing intact.  Gait normal, Romberg negative.  IMPRESSION: 1.  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 2.  Asymptomatic left ICA disease status post left CEA, stable 3.  Hyperlipidemia 4.  Idiopathic polyneuropathy  PLAN: 1.  ASA and Plavix for secondary stroke prevention as managed by PCP 2.  Statin therapy is managed by PCP (LDL goal less than 70). 3.  Monitor carotid stenosis with vascular surgery 4.  Mediterranean diet 5.  Monitor neuropathy symptoms 6.  Follow up for sleep study and with PCP regarding irregular heartbeat. 7.  Follow up in one year  25 minutes spent face to face with patient, over 50% spent discussing management.  Metta Clines, DO  CC: Penni Homans, MD

## 2019-05-29 ENCOUNTER — Ambulatory Visit: Payer: Medicare Other | Admitting: Neurology

## 2019-05-29 ENCOUNTER — Encounter: Payer: Self-pay | Admitting: Neurology

## 2019-05-29 ENCOUNTER — Other Ambulatory Visit: Payer: Self-pay

## 2019-05-29 VITALS — BP 135/78 | HR 70 | Ht 74.0 in | Wt 205.4 lb

## 2019-05-29 DIAGNOSIS — E782 Mixed hyperlipidemia: Secondary | ICD-10-CM

## 2019-05-29 DIAGNOSIS — I6521 Occlusion and stenosis of right carotid artery: Secondary | ICD-10-CM

## 2019-05-29 DIAGNOSIS — I63231 Cerebral infarction due to unspecified occlusion or stenosis of right carotid arteries: Secondary | ICD-10-CM

## 2019-05-29 DIAGNOSIS — G609 Hereditary and idiopathic neuropathy, unspecified: Secondary | ICD-10-CM

## 2019-05-29 NOTE — Patient Instructions (Signed)
Follow-up in one year.

## 2019-05-31 ENCOUNTER — Other Ambulatory Visit: Payer: Self-pay

## 2019-06-01 ENCOUNTER — Encounter: Payer: Self-pay | Admitting: Family Medicine

## 2019-06-01 ENCOUNTER — Ambulatory Visit (INDEPENDENT_AMBULATORY_CARE_PROVIDER_SITE_OTHER): Payer: Medicare Other | Admitting: Family Medicine

## 2019-06-01 VITALS — BP 120/68 | HR 78 | Temp 97.8°F | Resp 18 | Wt 206.0 lb

## 2019-06-01 DIAGNOSIS — I4891 Unspecified atrial fibrillation: Secondary | ICD-10-CM

## 2019-06-01 DIAGNOSIS — E782 Mixed hyperlipidemia: Secondary | ICD-10-CM

## 2019-06-01 DIAGNOSIS — R739 Hyperglycemia, unspecified: Secondary | ICD-10-CM | POA: Diagnosis not present

## 2019-06-01 DIAGNOSIS — I10 Essential (primary) hypertension: Secondary | ICD-10-CM

## 2019-06-01 DIAGNOSIS — I499 Cardiac arrhythmia, unspecified: Secondary | ICD-10-CM

## 2019-06-01 DIAGNOSIS — E039 Hypothyroidism, unspecified: Secondary | ICD-10-CM

## 2019-06-01 DIAGNOSIS — I6529 Occlusion and stenosis of unspecified carotid artery: Secondary | ICD-10-CM

## 2019-06-01 MED ORDER — RIVAROXABAN 20 MG PO TABS: 20.0000 mg | ORAL_TABLET | Freq: Every day | ORAL | 2 refills | Status: DC

## 2019-06-01 MED ORDER — RIVAROXABAN (XARELTO) VTE STARTER PACK (15 & 20 MG): ORAL_TABLET | ORAL | 0 refills | Status: DC

## 2019-06-01 NOTE — Patient Instructions (Signed)
Atrial Fibrillation Atrial fibrillation is a type of irregular or rapid heartbeat (arrhythmia). In atrial fibrillation, the top part of the heart (atria) quivers in a chaotic pattern. This makes the heart unable to pump blood normally. Having atrial fibrillation can increase your risk for other health problems, such as:  Blood can pool in the atria and form clots. If a clot travels to the brain, it can cause a stroke.  The heart muscle may weaken from the irregular blood flow. This can cause heart failure. Atrial fibrillation may start suddenly and stop on its own, or it may become a long-lasting problem. What are the causes? This condition is caused by some heart-related conditions or procedures, including:  High blood pressure. This is the most common cause.  Heart failure.  Heart valve conditions.  Inflammation of the sac that surrounds the heart (pericarditis).  Heart surgery.  Coronary artery disease.  Certain heart rhythm disorders, such as Wolf-Parkinson-White syndrome. Other causes include:  Pneumonia.  Obstructive sleep apnea.  Lung cancer.  Thyroid problems, especially if the thyroid is overactive (hyperthyroidism).  Excessive alcohol or drug use. Sometimes, the cause of this condition is not known. What increases the risk? This condition is more likely to develop in:  Older people.  People who smoke.  People who have diabetes mellitus.  People who are overweight (obese).  Athletes who exercise vigorously.  People who have a family history. What are the signs or symptoms? Symptoms of this condition include:  A feeling that your heart is beating rapidly or irregularly.  A feeling of discomfort or pain in your chest.  Shortness of breath.  Sudden light-headedness or weakness.  Getting tired easily during exercise. In some cases, there are no symptoms. How is this diagnosed? Your health care provider may be able to detect atrial fibrillation when  taking your pulse. If detected, this condition may be diagnosed with:  Electrocardiogram (ECG).  Ambulatory cardiac monitor. This device records your heartbeats for 24 hours or more.  Transthoracic echocardiogram (TTE) to evaluate how blood flows through your heart.  Transesophageal echocardiogram (TEE) to view more detailed images of your heart.  A stress test.  Imaging tests, such as a CT scan or chest X-ray.  Blood tests. How is this treated? This condition may be treated with:  Medicines to slow down the heart rate or bring the heart's rhythm back to normal.  Medicines to prevent blood clots from forming.  Electrical cardioversion. This delivers a low-energy shock to the heart to reset its rhythm.  Ablation. This procedure destroys the part of the heart tissue that sends abnormal signals.  Left atrial appendage occlusion/excision. This seals off a common place in the atria where blood clots can form (left atrial appendage). The goal of treatment is to prevent blood clots from forming and to keep your heart beating at a normal rate and rhythm. Treatment depends on underlying medical conditions and how you feel when you are experiencing fibrillation. Follow these instructions at home: Medicines  Take over-the counter and prescription medicines only as told by your health care provider.  If your health care provider prescribed a blood-thinning medicine (anticoagulant), take it exactly as told. Taking too much blood-thinning medicine can cause bleeding. Taking too little can enable a blood clot to form and travel to the brain, causing a stroke. Lifestyle      Do not use any products that contain nicotine or tobacco, such as cigarettes and e-cigarettes. If you need help quitting, ask your health   care provider.  Do not drink beverages that contain caffeine, such as coffee, soda, and tea.  Follow diet instructions as told by your health care provider.  Exercise regularly as  told by your health care provider.  Do not drink alcohol. General instructions  If you have obstructive sleep apnea, manage your condition as told by your health care provider.  Maintain a healthy weight. Do not use diet pills unless your health care provider approves. Diet pills may make heart problems worse.  Keep all follow-up visits as told by your health care provider. This is important. Contact a health care provider if you:  Notice a change in the rate, rhythm, or strength of your heartbeat.  Are taking an anticoagulant and you notice increased bruising.  Tire more easily when you exercise or exert yourself.  Have a sudden change in weight. Get help right away if you have:   Chest pain, abdominal pain, sweating, or weakness.  Difficulty breathing.  Blood in your vomit, stool (feces), or urine.  Any symptoms of a stroke. "BE FAST" is an easy way to remember the main warning signs of a stroke: ? B - Balance. Signs are dizziness, sudden trouble walking, or loss of balance. ? E - Eyes. Signs are trouble seeing or a sudden change in vision. ? F - Face. Signs are sudden weakness or numbness of the face, or the face or eyelid drooping on one side. ? A - Arms. Signs are weakness or numbness in an arm. This happens suddenly and usually on one side of the body. ? S - Speech. Signs are sudden trouble speaking, slurred speech, or trouble understanding what people say. ? T - Time. Time to call emergency services. Write down what time symptoms started.  Other signs of a stroke, such as: ? A sudden, severe headache with no known cause. ? Nausea or vomiting. ? Seizure. These symptoms may represent a serious problem that is an emergency. Do not wait to see if the symptoms will go away. Get medical help right away. Call your local emergency services (911 in the [place] Do not drive yourself to the hospital. Summary  Atrial fibrillation is a type of irregular or rapid heartbeat  (arrhythmia).  Symptoms include a feeling that your heart is beating fast or irregularly. In some cases, you may not have symptoms.  The condition is treated with medicines to slow down the heart rate or bring the heart's rhythm back to normal. You may also need blood-thinning medicines to prevent blood clots.  Get help right away if you have symptoms or signs of a stroke. This information is not intended to replace advice given to you by your health care provider. Make sure you discuss any questions you have with your health care provider. Document Released: 07/06/2005 Document Revised: 08/26/2017 Document Reviewed: 08/27/2017 Elsevier Patient Education  2020 [employer]  

## 2019-06-02 ENCOUNTER — Telehealth: Payer: Self-pay | Admitting: Pulmonary Disease

## 2019-06-02 NOTE — Telephone Encounter (Signed)
I have spoken with Tanner [name] and he will be picking up the HST machine on Monday 06/02/2019 @ 11:00am

## 2019-06-02 NOTE — Telephone Encounter (Signed)
It looks like Rodena Piety grabbed this HST this morning & will be contacting pt to schedule.

## 2019-06-04 DIAGNOSIS — I4819 Other persistent atrial fibrillation: Secondary | ICD-10-CM | POA: Insufficient documentation

## 2019-06-04 DIAGNOSIS — I4891 Unspecified atrial fibrillation: Secondary | ICD-10-CM | POA: Insufficient documentation

## 2019-06-04 NOTE — Assessment & Plan Note (Signed)
Well controlled, no changes to meds. Encouraged heart healthy diet such as the DASH diet and exercise as tolerated.  °

## 2019-06-04 NOTE — Progress Notes (Signed)
Subjective:    Patient ID: Tanner [name], male    DOB: [birth year], 84 y.o.   MRN: [id number]  No chief complaint on file.   HPI Patient is in today for evaluation of irregular heartbeats he is noting on his fitbit. He notes his numbers are running between 66-90 but he did have a 115 once while walking. No chest pain, sob or other new symptoms. No recent febrile illness or hospitalizations. Denies CP/palp/SOB/HA/congestion/fevers/GI or GU c/o. Taking meds as prescribed  Past Medical History:  Diagnosis Date  . Allergy    seasonal- mostly spring  . Anemia 02/03/2011  . ASTHMA, CHILDHOOD 03/17/2010   mostly as child  . BCC (basal cell carcinoma) 12/08/2011  . CAROTID ARTERY OCCLUSION, WITH INFARCTION 03/17/2010  . CEREBROVASCULAR ACCIDENT 03/17/2010   partial loss of peripheral vision on left-"slight improved"  . CHICKENPOX, HX OF 03/17/2010  . Constipation 12/08/2014  . Epistaxis 09/06/2013  . FATIGUE 03/17/2010  . Hearing loss 11/04/2010   bilateral  . HYPERKALEMIA 05/27/2010  . Hyperlipidemia   . Hypertension   . Hypothyroid 12/08/2011  . Mixed hyperlipidemia 03/17/2010  . Oral lesion 11/20/2014  . Overweight(278.02) 07/01/2010  . Peripheral neuropathy 08/18/2016  . Personal history of other infectious and parasitic disease 03/17/2010  . Preventative health care 08/23/2016  . Skin lesion of right arm 11/20/2014  . Sleep apnea    uses mouthpiece only  . Thrombocytopenia ([place]) 04/05/2012  . TOBACCO ABUSE, HX OF 03/17/2010  . Tubular adenoma of colon 06/10/2011    Past Surgical History:  Procedure Laterality Date  . CAROTID ENDARTERECTOMY Left February 13, 2013   cea  . CATARACT EXTRACTION, BILATERAL Right    Cataract, and stigmatism  . COLONOSCOPY  Dec. 2014  . ENDARTERECTOMY Left 02/13/2013   Procedure: ENDARTERECTOMY CAROTID;  Surgeon: Rosetta Posner, MD;  Location: [facility];  Service: Vascular;  Laterality: Left;  . EUS N/A 08/17/2013   Procedure: UPPER ENDOSCOPIC ULTRASOUND (EUS) LINEAR;   Surgeon: Milus Banister, MD;  Location: [facility];  Service: Endoscopy;  Laterality: N/A;  . knee cartliage repair  1964, 1990   bilateral  . [place]   removed  . ROTATOR CUFF REPAIR     right  . TONSILLECTOMY    . UPPER GI ENDOSCOPY  Jan. 2015    Family History  Problem Relation Age of Onset  . Alzheimer's disease Mother   . Emphysema Sister        cigarettes  . Cancer Sister        lung, tobacco   . Heart disease Son   . Coronary artery disease Brother   . Heart attack Brother   . Other Brother        heart problems- from fathers side  . Cancer Brother 69       lung? smoker  . Heart disease Brother 66       Heart disease before age 30  . Cancer Sister        gyn  . Heart disease Sister   . Bipolar disorder Daughter   . Obesity Daughter   . Stroke Father   . Heart disease Sister     Social History   Socioeconomic History  . Marital status: Divorced    Spouse name: Not on file  . Number of children: 3  . Years of education: 60yrs  . Highest education level: Bachelor's degree (e.g., BA, AB, BS)  Occupational History  . Occupation: retired  Employer: RETIRED  Social Needs  . Financial resource strain: Not on file  . Food insecurity    Worry: Not on file    Inability: Not on file  . Transportation needs    Medical: Not on file    Non-medical: Not on file  Tobacco Use  . Smoking status: Former Smoker    Packs/day: 2.00    Years: 20.00    Pack years: 40.00    Types: Cigarettes    Quit date: 07/19/1977    Years since quitting: 41.9  . Smokeless tobacco: Never Used  Substance and Sexual Activity  . Alcohol use: Yes    Alcohol/week: 7.0 standard drinks    Types: 7 Glasses of wine per week    Comment: dinner wine daily  . Drug use: No  . Sexual activity: Never  Lifestyle  . Physical activity    Days per week: Not on file    Minutes per session: Not on file  . Stress: Not on file  Relationships  . Social Product manager on phone: Not on file    Gets together: Not on file    Attends religious service: Not on file    Active member of club or organization: Not on file    Attends meetings of clubs or organizations: Not on file    Relationship status: Not on file  . Intimate partner violence    Fear of current or ex partner: Not on file    Emotionally abused: Not on file    Physically abused: Not on file    Forced sexual activity: Not on file  Other Topics Concern  . Not on file  Social History Narrative   Pt lives at home alone.    Caffeine Use: very little   Heart healthy diet, is planning exercise      Retired from Event organiser   Left handed    Outpatient Medications Prior to Visit  Medication Sig Dispense Refill  . amLODipine (NORVASC) 2.5 MG tablet TAKE 1 TABLET BY MOUTH 3  TIMES DAILY 270 tablet 3  . aspirin EC 81 MG tablet Take 81 mg by mouth daily.    [name] b complex vitamins tablet Take 1 tablet by mouth daily.    . chlorthalidone (HYGROTON) 25 MG tablet TAKE 1 TABLET BY MOUTH  EVERY MORNING 90 tablet 2  . Cholecalciferol (VITAMIN D PO) Take 2,000 mg by mouth daily.     . Coenzyme Q10 (CO Q-10) 300 MG CAPS Take 1 capsule by mouth daily.    . Glucosamine-Chondroit-Vit C-Mn (GLUCOSAMINE 1500 COMPLEX PO) Take 1,500 mg by mouth daily.     [name] levothyroxine (SYNTHROID) 88 MCG tablet TAKE 1 TABLET BY MOUTH  DAILY BEFORE BREAKFAST 90 tablet 3  . linaclotide (LINZESS) 145 MCG CAPS capsule TAKE 1 CAPSULE BY MOUTH  DAILY BEFORE BREAKFAST 90 capsule 1  . magnesium oxide (MAG-OX) 400 MG tablet Take 400 mg by mouth daily.    [name] MEGARED OMEGA-3 KRILL OIL 500 MG CAPS Take 1,000 mg by mouth.    . pantoprazole (PROTONIX) 40 MG tablet TAKE 1 TABLET BY MOUTH  DAILY 90 tablet 3  . Probiotic Product (PROBIOTIC PO) Take 1 capsule by mouth daily.     . ramipril (ALTACE) 10 MG capsule TAKE 1 CAPSULE BY MOUTH  TWICE DAILY 180 capsule 3  . rosuvastatin (CRESTOR) 40 MG tablet TAKE 1 TABLET BY MOUTH IN  THE EVENING  90 tablet 3  . Turmeric (CURCUMIN  95) 500 MG CAPS Take 500 mg by mouth.    . clopidogrel (PLAVIX) 75 MG tablet TAKE 1 TABLET BY MOUTH  DAILY WITH BREAKFAST 90 tablet 3   No facility-administered medications prior to visit.     No Known Allergies  Review of Systems  Constitutional: Negative for fever and malaise/fatigue.  HENT: Negative for congestion.   Eyes: Negative for blurred vision.  Respiratory: Negative for shortness of breath.   Cardiovascular: Negative for chest pain, palpitations and leg swelling.  Gastrointestinal: Negative for abdominal pain, blood in stool and nausea.  Genitourinary: Negative for dysuria and frequency.  Musculoskeletal: Negative for falls.  Skin: Negative for rash.  Neurological: Negative for dizziness, loss of consciousness and headaches.  Endo/Heme/Allergies: Negative for environmental allergies.  Psychiatric/Behavioral: Negative for depression. The patient is not nervous/anxious.        Objective:    Physical Exam Vitals signs and nursing note reviewed.  Constitutional:      General: He is not in acute distress.    Appearance: He is well-developed.  HENT:     Head: Normocephalic and atraumatic.     Nose: Nose normal.  Eyes:     General:        Right [name]: No discharge.        Left [name]: No discharge.  Neck:     Musculoskeletal: Normal range of motion and neck supple.  Cardiovascular:     Rate and Rhythm: Normal rate. Rhythm irregular.     Heart sounds: No murmur.  Pulmonary:     Effort: Pulmonary effort is normal.     Breath sounds: Normal breath sounds.  Abdominal:     General: Bowel sounds are normal.     Palpations: Abdomen is soft.     Tenderness: There is no abdominal tenderness.  Skin:    General: Skin is warm and dry.  Neurological:     Mental Status: He is alert and oriented to person, place, and time.     BP 120/68 (BP Location: Left Arm, Patient Position: Sitting, Cuff Size: Normal)   Pulse 78   Temp 97.8 F (36.6  C) (Temporal)   Resp 18   Wt 206 lb (93.4 kg)   SpO2 98%   BMI 26.45 kg/m  Wt Readings from Last 3 Encounters:  06/01/19 206 lb (93.4 kg)  05/29/19 205 lb 6.4 oz (93.2 kg)  05/23/19 205 lb 12.8 oz (93.4 kg)    Diabetic Foot Exam - Simple   No data filed     Lab Results  Component Value Date   WBC 6.5 03/28/2019   HGB 13.8 03/28/2019   HCT 41.8 03/28/2019   PLT 177.0 03/28/2019   GLUCOSE 92 03/28/2019   CHOL 139 03/28/2019   TRIG 71.0 03/28/2019   HDL 51.80 03/28/2019   LDLCALC 73 03/28/2019   ALT 11 03/28/2019   AST 12 03/28/2019   NA 140 03/28/2019   K 4.8 03/28/2019   CL 102 03/28/2019   CREATININE 1.17 03/28/2019   BUN 17 03/28/2019   CO2 29 03/28/2019   TSH 3.32 03/28/2019   PSA 0.39 05/08/2014   INR 0.99 02/07/2013   HGBA1C 5.7 03/28/2019   MICROALBUR 2.0 (H) 12/03/2015    Lab Results  Component Value Date   TSH 3.32 03/28/2019   Lab Results  Component Value Date   WBC 6.5 03/28/2019   HGB 13.8 03/28/2019   HCT 41.8 03/28/2019   MCV 96.3 03/28/2019   PLT 177.0 03/28/2019  Lab Results  Component Value Date   NA 140 03/28/2019   K 4.8 03/28/2019   CO2 29 03/28/2019   GLUCOSE 92 03/28/2019   BUN 17 03/28/2019   CREATININE 1.17 03/28/2019   BILITOT 0.9 03/28/2019   ALKPHOS 69 03/28/2019   AST 12 03/28/2019   ALT 11 03/28/2019   PROT 7.0 03/28/2019   ALBUMIN 4.5 03/28/2019   CALCIUM 9.8 03/28/2019   GFR 59.65 (L) 03/28/2019   Lab Results  Component Value Date   CHOL 139 03/28/2019   Lab Results  Component Value Date   HDL 51.80 03/28/2019   Lab Results  Component Value Date   LDLCALC 73 03/28/2019   Lab Results  Component Value Date   TRIG 71.0 03/28/2019   Lab Results  Component Value Date   CHOLHDL 3 03/28/2019   Lab Results  Component Value Date   HGBA1C 5.7 03/28/2019       Assessment & Plan:   Problem List Items Addressed This Visit    Hypothyroid (Chronic)    On Levothyroxine, continue to monitor       Mixed hyperlipidemia    Tolerating statin, encouraged heart healthy diet, avoid trans fats, minimize simple carbs and saturated fats. Increase exercise as tolerated      Relevant Medications   Rivaroxaban 15 & 20 MG TBPK   rivaroxaban (XARELTO) 20 MG TABS tablet   Essential hypertension, benign    Well controlled, no changes to meds. Encouraged heart healthy diet such as the DASH diet and exercise as tolerated.       Relevant Medications   Rivaroxaban 15 & 20 MG TBPK   rivaroxaban (XARELTO) 20 MG TABS tablet   Mild atherosclerosis of carotid artery    Has just undergone a doppler and notes his plaques are stable      Relevant Medications   Rivaroxaban 15 & 20 MG TBPK   rivaroxaban (XARELTO) 20 MG TABS tablet   Hyperglycemia    hgba1c acceptable, minimize simple carbs. Increase exercise as tolerated.       New onset a-fib [facility])    He has been asymptomatic but has noted irregular readings on his fitbit. His EKG in office confirms atrial fibrillation. Is started on xarelto and referred back to cardiology for further consideration. Is rate controlled      Relevant Medications   Rivaroxaban 15 & 20 MG TBPK   rivaroxaban (XARELTO) 20 MG TABS tablet   Other Relevant Orders   Ambulatory referral to Cardiology    Other Visit Diagnoses    Irregular heartbeat    -  Primary   Relevant Orders   EKG 12-Lead (Completed)      I have discontinued Tanner [name]. Tanner "Phil"'s clopidogrel. I am also having him start on Rivaroxaban and rivaroxaban. Additionally, I am having him maintain his Co Q-10, Cholecalciferol (VITAMIN D PO), b complex vitamins, Probiotic Product (PROBIOTIC PO), aspirin EC, Glucosamine-Chondroit-Vit C-Mn (GLUCOSAMINE 1500 COMPLEX PO), magnesium oxide, Turmeric, MegaRed Omega-3 Krill Oil, chlorthalidone, linaclotide, levothyroxine, ramipril, amLODipine, pantoprazole, and rosuvastatin.  Meds ordered this encounter  Medications  . Rivaroxaban 15 & 20 MG TBPK    Sig:  Follow package directions: Take one 15mg  tablet by mouth twice a day. On day 22, switch to one 20mg  tablet once a day. Take with food.    Dispense:  51 each    Refill:  0  . rivaroxaban (XARELTO) 20 MG TABS tablet    Sig: Take 1 tablet (20 mg total) by mouth  daily with supper.    Dispense:  30 tablet    Refill:  2     Penni Homans, MD

## 2019-06-04 NOTE — Assessment & Plan Note (Signed)
On Levothyroxine, continue to monitor 

## 2019-06-04 NOTE — Assessment & Plan Note (Signed)
Tolerating statin, encouraged heart healthy diet, avoid trans fats, minimize simple carbs and saturated fats. Increase exercise as tolerated 

## 2019-06-04 NOTE — Assessment & Plan Note (Signed)
hgba1c acceptable, minimize simple carbs. Increase exercise as tolerated.  

## 2019-06-04 NOTE — Assessment & Plan Note (Signed)
He has been asymptomatic but has noted irregular readings on his fitbit. His EKG in office confirms atrial fibrillation. Is started on xarelto and referred back to cardiology for further consideration. Is rate controlled

## 2019-06-04 NOTE — Assessment & Plan Note (Signed)
Has just undergone a doppler and notes his plaques are stable

## 2019-06-05 ENCOUNTER — Other Ambulatory Visit: Payer: Self-pay

## 2019-06-05 ENCOUNTER — Ambulatory Visit: Payer: Medicare Other

## 2019-06-05 DIAGNOSIS — G4733 Obstructive sleep apnea (adult) (pediatric): Secondary | ICD-10-CM

## 2019-06-13 DIAGNOSIS — G4733 Obstructive sleep apnea (adult) (pediatric): Secondary | ICD-10-CM | POA: Diagnosis not present

## 2019-06-14 ENCOUNTER — Telehealth: Payer: Self-pay

## 2019-06-14 DIAGNOSIS — G4733 Obstructive sleep apnea (adult) (pediatric): Secondary | ICD-10-CM

## 2019-06-14 NOTE — Telephone Encounter (Signed)
Call made to patient, confirmed DOB, made aware of HST results:  AHI 27.1, moderate sleep apnea  Recommends cpap auto titrating 5-15.   Encourage weight loss measures and caution against driving sleepy and with any medications with sedative effects. Okay with placing order.   Order placed.   Pt inquired as to whether he needs to keep his appt 12.16. I made him aware I would ask OA. I think appt will need to be pushed out until he starts his cpap.   Recall placed for 6 week f/u as AO schedule is not out yet.   AO please advise. Thanks.

## 2019-06-21 NOTE — Telephone Encounter (Signed)
Pt following up regarding CPAP.  [phone] or [phone]

## 2019-06-21 NOTE — Telephone Encounter (Signed)
Called and spoke with pt letting him know that we did need to reschedule his appt for 6 weeks after starting on cpap. Pt verbalized understanding. Asked pt if he wanted to go ahead and schedule an appt with an APP and pt stated he wanted to have the OV with AO. AO's schedule is not open 2 months from now so I have placed a recall in pt's chart. Nothing further needed.

## 2019-06-21 NOTE — Telephone Encounter (Signed)
Reschedule appt for 6 weeks following initiation of cpap

## 2019-06-21 NOTE — Telephone Encounter (Signed)
AO is in clinic today. I am going to also route this to Salome Arnt who is working with him for clarification.

## 2019-06-28 NOTE — Progress Notes (Signed)
CARDIOLOGY CONSULT NOTE       Patient ID: Tanner [name] MRN: [id number] DOB/AGE: 82-26-1936 82 y.o.  Admit date: (Not on file) Referring Physician: [name] Primary Physician: [name], MD Primary Cardiologist: New Reason for Consultation: Afib  Active Problems:   * No active hospital problems. *   HPI:  84 y.o. referred by Dr [name] for new onset afib.  CRF;s quit smoking in 2011, HTN, HLD vascular disease with CVA 2011, hypothyroidism and OSA using a mouth piece and not CPAP He uses a fitbit and noted irregular heart beats rates 66-90 with high of 115 with activity  Seen by primary 05/31/19 He has not had any chest pain, syncope dyspnea or new illnesses TSH Hct and PLT normal in September 2020 LDL 73 on high dose crestor Plavix was stopped and started on xarelto Normal renal function. Distant history of epistaxis but no other bleeding issues He is on low dose norvasc 2.5 mg but no strong AV nodal blocking drugs. Last carotid 05/23/19 with recanalized right ICA via ECG and no significant stenosis in Left ICA post CEA in 2014 he has not had echo since 2014 AT that time EF 55-60% with mild LAE and mild MR mild AS with mean gradient 11 peak 21 mmHg  This patients CHA2DS2-VASc Score and unadjusted Ischemic Stroke Rate (% per year) is equal to 9.7 % stroke rate/year from a score of 6  Above score calculated as 1 point each if present [CHF, HTN, DM, Vascular=MI/PAD/Aortic Plaque, Age if 65-74, or Male] Above score calculated as 2 points each if present [Age > 75, or Stroke/TIA/TE]   Symptoms of higher HR and palpitations for a few months Long discussion with him about diagnosis afib and Rx strategies Divorced but good friends with his ex. Retired Animal nutritionist. Two children   ROS All other systems reviewed and negative except as noted above  Past Medical History:  Diagnosis Date  . Allergy    seasonal- mostly spring  . Anemia 02/03/2011  . ASTHMA, CHILDHOOD 03/17/2010    mostly as child  . BCC (basal cell carcinoma) 12/08/2011  . CAROTID ARTERY OCCLUSION, WITH INFARCTION 03/17/2010  . CEREBROVASCULAR ACCIDENT 03/17/2010   partial loss of peripheral vision on left-"slight improved"  . CHICKENPOX, HX OF 03/17/2010  . Constipation 12/08/2014  . Epistaxis 09/06/2013  . FATIGUE 03/17/2010  . Hearing loss 11/04/2010   bilateral  . HYPERKALEMIA 05/27/2010  . Hyperlipidemia   . Hypertension   . Hypothyroid 12/08/2011  . Mixed hyperlipidemia 03/17/2010  . Oral lesion 11/20/2014  . Overweight(278.02) 07/01/2010  . Peripheral neuropathy 08/18/2016  . Personal history of other infectious and parasitic disease 03/17/2010  . Preventative health care 08/23/2016  . Skin lesion of right arm 11/20/2014  . Sleep apnea    uses mouthpiece only  . Thrombocytopenia ([place]) 04/05/2012  . TOBACCO ABUSE, HX OF 03/17/2010  . Tubular adenoma of colon 06/10/2011    Family History  Problem Relation Age of Onset  . Alzheimer's disease Mother   . Emphysema Sister        cigarettes  . Cancer Sister        lung, tobacco   . Heart disease Son   . Coronary artery disease Brother   . Heart attack Brother   . Other Brother        heart problems- from fathers side  . Cancer Brother 75       lung? smoker  . Heart disease Brother 6  Heart disease before age 49  . Cancer Sister        gyn  . Heart disease Sister   . Bipolar disorder Daughter   . Obesity Daughter   . Stroke Father   . Heart disease Sister     Social History   Socioeconomic History  . Marital status: Divorced    Spouse name: Not on file  . Number of children: 3  . Years of education: 49yrs  . Highest education level: Bachelor's degree (e.g., BA, AB, BS)  Occupational History  . Occupation: retired    Fish farm manager: RETIRED  Tobacco Use  . Smoking status: Former Smoker    Packs/day: 2.00    Years: 20.00    Pack years: 40.00    Types: Cigarettes    Quit date: 07/19/1977    Years since quitting: 42.0  .  Smokeless tobacco: Never Used  Substance and Sexual Activity  . Alcohol use: Yes    Alcohol/week: 7.0 standard drinks    Types: 7 Glasses of wine per week    Comment: dinner wine daily  . Drug use: No  . Sexual activity: Never  Other Topics Concern  . Not on file  Social History Narrative   Pt lives at home alone.    Caffeine Use: very little   Heart healthy diet, is planning exercise      Retired from Event organiser   Left handed   Social Determinants of Health   Financial Resource Strain:   . Difficulty of Paying Living Expenses: Not on file  Food Insecurity:   . Worried About Charity fundraiser in the Last Year: Not on file  . Ran Out of Food in the Last Year: Not on file  Transportation Needs:   . Lack of Transportation (Medical): Not on file  . Lack of Transportation (Non-Medical): Not on file  Physical Activity:   . Days of Exercise per Week: Not on file  . Minutes of Exercise per Session: Not on file  Stress:   . Feeling of Stress : Not on file  Social Connections:   . Frequency of Communication with Friends and Family: Not on file  . Frequency of Social Gatherings with Friends and Family: Not on file  . Attends Religious Services: Not on file  . Active Member of Clubs or Organizations: Not on file  . Attends Archivist Meetings: Not on file  . Marital Status: Not on file  Intimate Partner Violence:   . Fear of Current or Ex-Partner: Not on file  . Emotionally Abused: Not on file  . Physically Abused: Not on file  . Sexually Abused: Not on file    Past Surgical History:  Procedure Laterality Date  . CAROTID ENDARTERECTOMY Left February 13, 2013   cea  . CATARACT EXTRACTION, BILATERAL Right    Cataract, and stigmatism  . COLONOSCOPY  Dec. 2014  . ENDARTERECTOMY Left 02/13/2013   Procedure: ENDARTERECTOMY CAROTID;  Surgeon: Rosetta Posner, MD;  Location: [place];  Service: Vascular;  Laterality: Left;  . EUS N/A 08/17/2013   Procedure: UPPER ENDOSCOPIC  ULTRASOUND (EUS) LINEAR;  Surgeon: Milus Banister, MD;  Location: [facility];  Service: Endoscopy;  Laterality: N/A;  . knee cartliage repair  1964, 1990   bilateral  . [place]   removed  . ROTATOR CUFF REPAIR     right  . TONSILLECTOMY    . UPPER GI ENDOSCOPY  Jan. 2015  Physical Exam: Blood pressure 128/82, pulse 86, height 6\' 2"  (1.88 m), weight 209 lb (94.8 kg), SpO2 99 %.   Affect appropriate Healthy:  appears stated age 39: normal Neck supple with no adenopathy JVP normal no bruits no thyromegaly Lungs clear with no wheezing and good diaphragmatic motion Heart:  S1/S2 AS murmur, no rub, gallop or click PMI normal Abdomen: benighn, BS positve, no tenderness, no AAA no bruit.  No HSM or HJR Distal pulses intact with no bruits No edema Neuro old left visual field defect  Skin warm and dry No muscular weakness   Labs:   Lab Results  Component Value Date   WBC 6.5 03/28/2019   HGB 13.8 03/28/2019   HCT 41.8 03/28/2019   MCV 96.3 03/28/2019   PLT 177.0 03/28/2019   No results for input(s): NA, K, CL, CO2, BUN, CREATININE, CALCIUM, PROT, BILITOT, ALKPHOS, ALT, AST, GLUCOSE in the last 168 hours.  Invalid input(s): LABALBU Lab Results  Component Value Date   CKTOTAL 95 03/10/2010   CKMB 3.6 03/10/2010   TROPONINI 0.01        NO INDICATION OF MYOCARDIAL INJURY. 03/10/2010    Lab Results  Component Value Date   CHOL 139 03/28/2019   CHOL 160 09/27/2018   CHOL 152 03/29/2018   Lab Results  Component Value Date   HDL 51.80 03/28/2019   HDL 49.20 09/27/2018   HDL 59.30 03/29/2018   Lab Results  Component Value Date   LDLCALC 73 03/28/2019   LDLCALC 85 09/27/2018   LDLCALC 73 03/29/2018   Lab Results  Component Value Date   TRIG 71.0 03/28/2019   TRIG 129.0 09/27/2018   TRIG 98.0 03/29/2018   Lab Results  Component Value Date   CHOLHDL 3 03/28/2019   CHOLHDL 3 09/27/2018   CHOLHDL 3 03/29/2018   No results  found for: LDLDIRECT    Radiology: No results found.  EKG: afib rate 66    ASSESSMENT AND PLAN:   1. AFib:  Onset unknown Agree with xarelto CHADVASC 6. D/c norvasc and start Toprol for rate control will update echo for EF and atrial size given known vascular disease will also get lexiscan myovue r/o ischemia  2. HTN:  Well controlled.  Continue current medications and low sodium Dash type diet.   3. AS/MR:  By echo 2014 see above will update echo both mild in 2014  4. Thyroid: continue current dose of synthroid TSH normal  5. HDL  Continue high dose crestor LDL at goal  6. CVA: carotids stable plavix d/c due to need for anticoagulation continue low dose ASA 81 mg   Have scheduled [facility] for 08/10/18 pending results of his echo and myovue  Script for Toprol and Xarelto called in   Signed: Jenkins Rouge 07/10/2019, 8:24 AM

## 2019-07-05 ENCOUNTER — Ambulatory Visit: Payer: Medicare Other | Admitting: Pulmonary Disease

## 2019-07-07 ENCOUNTER — Other Ambulatory Visit: Payer: Self-pay | Admitting: Family Medicine

## 2019-07-07 ENCOUNTER — Other Ambulatory Visit: Payer: Self-pay | Admitting: Internal Medicine

## 2019-07-07 NOTE — Telephone Encounter (Signed)
Last OV 06/01/19 Last refill 12/01/18 #90/2 Next OV 10/12/19

## 2019-07-10 ENCOUNTER — Other Ambulatory Visit: Payer: Self-pay

## 2019-07-10 ENCOUNTER — Ambulatory Visit: Payer: Medicare Other | Admitting: Cardiovascular Disease

## 2019-07-10 ENCOUNTER — Encounter: Payer: Self-pay | Admitting: Cardiovascular Disease

## 2019-07-10 ENCOUNTER — Other Ambulatory Visit: Payer: Self-pay | Admitting: Cardiovascular Disease

## 2019-07-10 VITALS — BP 128/82 | HR 86 | Ht 74.0 in | Wt 209.0 lb

## 2019-07-10 DIAGNOSIS — R079 Chest pain, unspecified: Secondary | ICD-10-CM | POA: Diagnosis not present

## 2019-07-10 DIAGNOSIS — I4819 Other persistent atrial fibrillation: Secondary | ICD-10-CM | POA: Diagnosis not present

## 2019-07-10 MED ORDER — RIVAROXABAN 20 MG PO TABS: 20.0000 mg | ORAL_TABLET | Freq: Every day | ORAL | 6 refills | Status: DC

## 2019-07-10 MED ORDER — METOPROLOL SUCCINATE ER 25 MG PO TB24: 25.0000 mg | ORAL_TABLET | Freq: Every day | ORAL | 3 refills | Status: DC

## 2019-07-10 NOTE — Patient Instructions (Addendum)
Medication Instructions:  Your physician has recommended you make the following change in your medication:  1-START xarelto 20 mg by mouth daily 2-START Metoprolol 25 mg by mouth daily 3-STOP Amlodipine   *If you need a refill on your cardiac medications before your next appointment, please call your pharmacy*  Lab Work: Your physician recommends that you have lab work today- BMET and CBC  If you have labs (blood work) drawn today and your tests are completely normal, you will receive your results only by: [name] MyChart Message (if you have MyChart) OR . A paper copy in the mail If you have any lab test that is abnormal or we need to change your treatment, we will call you to review the results.  Testing/Procedures: Your physician has requested that you have a lexiscan myoview. For further information please visit [email or web address]. Please follow instruction sheet, as given.  Your physician has requested that you have an echocardiogram. Echocardiography is a painless test that uses sound waves to create images of your heart. It provides your doctor with information about the size and shape of your heart and how well your heart's chambers and valves are working. This procedure takes approximately one hour. There are no restrictions for this procedure.  Your physician has recommended that you have a Cardioversion (DCCV). Electrical Cardioversion uses a jolt of electricity to your heart either through paddles or wired patches attached to your chest. This is a controlled, usually prescheduled, procedure. Defibrillation is done under light anesthesia in the hospital, and you usually go home the day of the procedure. This is done to get your heart back into a normal rhythm. You are not awake for the procedure. Please see the instruction sheet given to you today.  Follow-Up: At [facility], you and your health needs are our priority.  As part of our continuing mission to provide you with  exceptional heart care, we have created designated Provider Care Teams.  These Care Teams include your primary Cardiologist (physician) and Advanced Practice Providers (APPs -  Physician Assistants and Nurse Practitioners) who all work together to provide you with the care you need, when you need it.  Your next appointment:   3 weeks  The format for your next appointment:   In Person  Provider:   Your physician recommends that you schedule a follow-up appointment with [facility].

## 2019-07-11 LAB — CBC WITH DIFFERENTIAL/PLATELET
Basophils Absolute: 0.1 10*3/uL (ref 0.0–0.2)
Basos: 1 %
EOS (ABSOLUTE): 0.3 10*3/uL (ref 0.0–0.4)
Eos: 5 %
Hematocrit: 44.1 % (ref 37.5–51.0)
Hemoglobin: 14.6 g/dL (ref 13.0–17.7)
Immature Grans (Abs): 0 10*3/uL (ref 0.0–0.1)
Immature Granulocytes: 0 %
Lymphocytes Absolute: 1.7 10*3/uL (ref 0.7–3.1)
Lymphs: 27 %
MCH: 31.1 pg (ref 26.6–33.0)
MCHC: 33.1 g/dL (ref 31.5–35.7)
MCV: 94 fL (ref 79–97)
Monocytes Absolute: 0.6 10*3/uL (ref 0.1–0.9)
Monocytes: 10 %
Neutrophils Absolute: 3.7 10*3/uL (ref 1.4–7.0)
Neutrophils: 57 %
RBC: 4.7 x10E6/uL (ref 4.14–5.80)
RDW: 13 % (ref 11.6–15.4)
WBC: 6.5 10*3/uL (ref 3.4–10.8)

## 2019-07-11 LAB — BASIC METABOLIC PANEL
BUN/Creatinine Ratio: 14 (ref 10–24)
BUN: 16 mg/dL (ref 8–27)
CO2: 21 mmol/L (ref 20–29)
Calcium: 9.3 mg/dL (ref 8.6–10.2)
Chloride: 102 mmol/L (ref 96–106)
Creatinine, Ser: 1.18 mg/dL (ref 0.76–1.27)
GFR calc Af Amer: 66 mL/min/{1.73_m2} (ref >59)
GFR calc non Af Amer: 57 mL/min/{1.73_m2} — ABNORMAL LOW (ref >59)
Glucose: 100 mg/dL — ABNORMAL HIGH (ref 65–99)
Potassium: 4.7 mmol/L (ref 3.5–5.2)
Sodium: 140 mmol/L (ref 134–144)

## 2019-07-12 ENCOUNTER — Telehealth (HOSPITAL_COMMUNITY): Payer: Self-pay | Admitting: *Deleted

## 2019-07-12 NOTE — Telephone Encounter (Signed)
Patient given detailed instructions per Myocardial Perfusion Study Information Sheet for the test on 07/19/19. Patient notified to arrive 15 minutes early and that it is imperative to arrive on time for appointment to keep from having the test rescheduled.  If you need to cancel or reschedule your appointment, please call the office within 24 hours of your appointment. . Patient verbalized understanding. Kirstie Peri

## 2019-07-19 ENCOUNTER — Other Ambulatory Visit: Payer: Self-pay

## 2019-07-19 ENCOUNTER — Ambulatory Visit (HOSPITAL_COMMUNITY): Payer: Medicare Other | Attending: Internal Medicine

## 2019-07-19 ENCOUNTER — Ambulatory Visit (HOSPITAL_BASED_OUTPATIENT_CLINIC_OR_DEPARTMENT_OTHER): Payer: Medicare Other

## 2019-07-19 DIAGNOSIS — I4819 Other persistent atrial fibrillation: Secondary | ICD-10-CM | POA: Diagnosis not present

## 2019-07-19 DIAGNOSIS — R079 Chest pain, unspecified: Secondary | ICD-10-CM

## 2019-07-19 LAB — MYOCARDIAL PERFUSION IMAGING
LV dias vol: 100 mL (ref 62–150)
LV sys vol: 51 mL
Peak HR: 74 {beats}/min
Rest HR: 53 {beats}/min
SDS: 0
SRS: 0
SSS: 0
TID: 1.06

## 2019-07-19 LAB — ECHOCARDIOGRAM COMPLETE
Height: 74 in
Weight: 3344 oz

## 2019-07-19 MED ORDER — REGADENOSON 0.4 MG/5ML IV SOLN
0.4000 mg | Freq: Once | INTRAVENOUS | Status: AC
  Administered 2019-07-19: 0.4 mg via INTRAVENOUS

## 2019-07-19 MED ORDER — TECHNETIUM TC 99M TETROFOSMIN IV KIT
10.1000 | PACK | Freq: Once | INTRAVENOUS | Status: AC | PRN
  Administered 2019-07-19: 10.1 via INTRAVENOUS
  Filled 2019-07-19: qty 11

## 2019-07-19 MED ORDER — TECHNETIUM TC 99M TETROFOSMIN IV KIT
32.8000 | PACK | Freq: Once | INTRAVENOUS | Status: AC | PRN
  Administered 2019-07-19: 32.8 via INTRAVENOUS
  Filled 2019-07-19: qty 33

## 2019-07-20 ENCOUNTER — Other Ambulatory Visit: Payer: Self-pay

## 2019-07-20 DIAGNOSIS — I4819 Other persistent atrial fibrillation: Secondary | ICD-10-CM

## 2019-07-20 DIAGNOSIS — I350 Nonrheumatic aortic (valve) stenosis: Secondary | ICD-10-CM

## 2019-07-20 NOTE — Telephone Encounter (Signed)
Patient aware of results. Will place order for echo and refill on xarelto and metoprolol to go to mail order pharmacy on Monday.

## 2019-07-20 NOTE — Telephone Encounter (Signed)
-----   Message from Josue Hector, MD sent at 07/19/2019  8:34 PM EST ----- EF normal just mild AS/MR f/u echo in a year

## 2019-07-24 ENCOUNTER — Telehealth: Payer: Self-pay | Admitting: Cardiovascular Disease

## 2019-07-24 NOTE — Telephone Encounter (Signed)
New Message      *STAT* If patient is at the pharmacy, call can be transferred to refill team.   1. Which medications need to be refilled? (please list name of each medication and dose if known) metoprolol succinate (TOPROL XL) 25 MG 24 hr tablet rivaroxaban (XARELTO) 20 MG TABS tablet  2. Which pharmacy/location (including street and city if local pharmacy) is medication to be sent to? [facility]   3. Do they need a 30 day or 90 day supply? [place]

## 2019-07-25 MED ORDER — METOPROLOL SUCCINATE ER 25 MG PO TB24: 25.0000 mg | ORAL_TABLET | Freq: Every day | ORAL | 3 refills | Status: DC

## 2019-07-25 MED ORDER — RIVAROXABAN 20 MG PO TABS: 20.0000 mg | ORAL_TABLET | Freq: Every day | ORAL | 3 refills | Status: DC

## 2019-08-02 ENCOUNTER — Encounter (HOSPITAL_COMMUNITY): Payer: Self-pay | Admitting: Physician Assistant

## 2019-08-02 ENCOUNTER — Other Ambulatory Visit: Payer: Self-pay

## 2019-08-02 ENCOUNTER — Ambulatory Visit (HOSPITAL_COMMUNITY)
Admission: RE | Admit: 2019-08-02 | Discharge: 2019-08-02 | Disposition: A | Discharge status: Home / Self Care | Payer: Medicare Other | Source: Ambulatory Visit | Attending: Physician Assistant | Admitting: Physician Assistant

## 2019-08-02 VITALS — BP 132/78 | HR 72 | Ht 74.0 in | Wt 210.0 lb

## 2019-08-02 DIAGNOSIS — Z7901 Long term (current) use of anticoagulants: Secondary | ICD-10-CM | POA: Diagnosis not present

## 2019-08-02 DIAGNOSIS — H9193 Unspecified hearing loss, bilateral: Secondary | ICD-10-CM | POA: Diagnosis not present

## 2019-08-02 DIAGNOSIS — G4733 Obstructive sleep apnea (adult) (pediatric): Secondary | ICD-10-CM | POA: Diagnosis not present

## 2019-08-02 DIAGNOSIS — D6869 Other thrombophilia: Secondary | ICD-10-CM

## 2019-08-02 DIAGNOSIS — E875 Hyperkalemia: Secondary | ICD-10-CM | POA: Insufficient documentation

## 2019-08-02 DIAGNOSIS — I4819 Other persistent atrial fibrillation: Secondary | ICD-10-CM | POA: Insufficient documentation

## 2019-08-02 DIAGNOSIS — Z01818 Encounter for other preprocedural examination: Secondary | ICD-10-CM | POA: Diagnosis present

## 2019-08-02 DIAGNOSIS — E039 Hypothyroidism, unspecified: Secondary | ICD-10-CM | POA: Insufficient documentation

## 2019-08-02 DIAGNOSIS — Z8673 Personal history of transient ischemic attack (TIA), and cerebral infarction without residual deficits: Secondary | ICD-10-CM | POA: Insufficient documentation

## 2019-08-02 DIAGNOSIS — D649 Anemia, unspecified: Secondary | ICD-10-CM | POA: Insufficient documentation

## 2019-08-02 DIAGNOSIS — E785 Hyperlipidemia, unspecified: Secondary | ICD-10-CM | POA: Insufficient documentation

## 2019-08-02 DIAGNOSIS — Z79899 Other long term (current) drug therapy: Secondary | ICD-10-CM | POA: Insufficient documentation

## 2019-08-02 DIAGNOSIS — Z87891 Personal history of nicotine dependence: Secondary | ICD-10-CM | POA: Insufficient documentation

## 2019-08-02 DIAGNOSIS — I10 Essential (primary) hypertension: Secondary | ICD-10-CM | POA: Insufficient documentation

## 2019-08-02 DIAGNOSIS — Z7982 Long term (current) use of aspirin: Secondary | ICD-10-CM | POA: Insufficient documentation

## 2019-08-02 DIAGNOSIS — D696 Thrombocytopenia, unspecified: Secondary | ICD-10-CM | POA: Insufficient documentation

## 2019-08-02 LAB — CBC
HCT: 46.1 % (ref 39.0–52.0)
Hemoglobin: 15.1 g/dL (ref 13.0–17.0)
MCH: 31.5 pg (ref 26.0–34.0)
MCHC: 32.8 g/dL (ref 30.0–36.0)
MCV: 96 fL (ref 80.0–100.0)
Platelets: 151 K/uL (ref 150–400)
RBC: 4.8 MIL/uL (ref 4.22–5.81)
RDW: 14.2 % (ref 11.5–15.5)
WBC: 6.7 K/uL (ref 4.0–10.5)
nRBC: 0 % (ref 0.0–0.2)

## 2019-08-02 LAB — BASIC METABOLIC PANEL WITH GFR
Anion gap: 11 (ref 5–15)
BUN: 15 mg/dL (ref 8–23)
CO2: 27 mmol/L (ref 22–32)
Calcium: 9.4 mg/dL (ref 8.9–10.3)
Chloride: 101 mmol/L (ref 98–111)
Creatinine, Ser: 1.09 mg/dL (ref 0.61–1.24)
GFR calc Af Amer: >60 mL/min
GFR calc non Af Amer: >60 mL/min
Glucose, Bld: 103 mg/dL — ABNORMAL HIGH (ref 70–99)
Potassium: 4.9 mmol/L (ref 3.5–5.1)
Sodium: 139 mmol/L (ref 135–145)

## 2019-08-02 NOTE — H&P (View-Only) (Signed)
Primary Care Physician: [name], MD Primary Cardiologist: Dr [name] Cancel Primary Electrophysiologist: none Referring Physician: Dr Shirlee More is a 85 y.o. male with a history of HTN, tobacco abuse quit 2011, CVA 2011, HLD, hypothyroidism, OSA, and new onset persistent atrial fibrillation who presents for consultation in the [facility].  The patient was initially diagnosed with atrial fibrillation 05/2019 at his PCP office after noting irregular readings on his FitBit. He is otherwise asymptomatic and rate controlled. Patient is on Xarelto for a CHADS2VASC score of 6. He is scheduled for DCCV on 08/11/19  Today, he denies symptoms of palpitations, chest pain, shortness of breath, orthopnea, PND, lower extremity edema, dizziness, presyncope, syncope, snoring, daytime somnolence, bleeding, or neurologic sequela. The patient is tolerating medications without difficulties and is otherwise without complaint today.    Atrial Fibrillation Risk Factors:  he does have symptoms or diagnosis of sleep apnea. he is compliant with oral device therapy. he does not have a history of rheumatic fever. he does have a history of alcohol use. The patient does not have a history of early familial atrial fibrillation or other arrhythmias.  he has a BMI of Body mass index is 26.96 kg/m.[name] Filed Weights   08/02/19 0841  Weight: 95.3 kg    Family History  Problem Relation Age of Onset  . Alzheimer's disease Mother   . Emphysema Sister        cigarettes  . Cancer Sister        lung, tobacco   . Heart disease Son   . Coronary artery disease Brother   . Heart attack Brother   . Other Brother        heart problems- from fathers side  . Cancer Brother 62       lung? smoker  . Heart disease Brother 14       Heart disease before age 1  . Cancer Sister        gyn  . Heart disease Sister   . Bipolar disorder Daughter   . Obesity Daughter   . Stroke  Father   . Heart disease Sister      Atrial Fibrillation Management history:  Previous antiarrhythmic drugs: none Previous cardioversions: none Previous ablations: none CHADS2VASC score: 6 Anticoagulation history: Xarelto   Past Medical History:  Diagnosis Date  . Allergy    seasonal- mostly spring  . Anemia 02/03/2011  . ASTHMA, CHILDHOOD 03/17/2010   mostly as child  . BCC (basal cell carcinoma) 12/08/2011  . CAROTID ARTERY OCCLUSION, WITH INFARCTION 03/17/2010  . CEREBROVASCULAR ACCIDENT 03/17/2010   partial loss of peripheral vision on left-"slight improved"  . CHICKENPOX, HX OF 03/17/2010  . Constipation 12/08/2014  . Epistaxis 09/06/2013  . FATIGUE 03/17/2010  . Hearing loss 11/04/2010   bilateral  . HYPERKALEMIA 05/27/2010  . Hyperlipidemia   . Hypertension   . Hypothyroid 12/08/2011  . Mixed hyperlipidemia 03/17/2010  . Oral lesion 11/20/2014  . Overweight(278.02) 07/01/2010  . Peripheral neuropathy 08/18/2016  . Personal history of other infectious and parasitic disease 03/17/2010  . Preventative health care 08/23/2016  . Skin lesion of right arm 11/20/2014  . Sleep apnea    uses mouthpiece only  . Thrombocytopenia ([place]) 04/05/2012  . TOBACCO ABUSE, HX OF 03/17/2010  . Tubular adenoma of colon 06/10/2011   Past Surgical History:  Procedure Laterality Date  . CAROTID ENDARTERECTOMY Left February 13, 2013   cea  . CATARACT EXTRACTION, BILATERAL Right  Cataract, and stigmatism  . COLONOSCOPY  Dec. 2014  . ENDARTERECTOMY Left 02/13/2013   Procedure: ENDARTERECTOMY CAROTID;  Surgeon: Rosetta Posner, MD;  Location: [facility];  Service: Vascular;  Laterality: Left;  . EUS N/A 08/17/2013   Procedure: UPPER ENDOSCOPIC ULTRASOUND (EUS) LINEAR;  Surgeon: Milus Banister, MD;  Location: [facility];  Service: Endoscopy;  Laterality: N/A;  . knee cartliage repair  1964, 1990   bilateral  . Newborn   removed  . ROTATOR CUFF REPAIR     right  . TONSILLECTOMY    .  UPPER GI ENDOSCOPY  Jan. 2015    Current Outpatient Medications  Medication Sig Dispense Refill  . aspirin EC 81 MG tablet Take 81 mg by mouth daily.    . chlorthalidone (HYGROTON) 25 MG tablet TAKE 1 TABLET BY MOUTH IN  THE MORNING 90 tablet 3  . levothyroxine (SYNTHROID) 88 MCG tablet TAKE 1 TABLET BY MOUTH  DAILY BEFORE BREAKFAST 90 tablet 3  . LINZESS 145 MCG CAPS capsule TAKE 1 CAPSULE BY MOUTH  DAILY BEFORE BREAKFAST 90 capsule 0  . metoprolol succinate (TOPROL XL) 25 MG 24 hr tablet Take 1 tablet (25 mg total) by mouth daily. 90 tablet 3  . pantoprazole (PROTONIX) 40 MG tablet TAKE 1 TABLET BY MOUTH  DAILY 90 tablet 3  . ramipril (ALTACE) 10 MG capsule TAKE 1 CAPSULE BY MOUTH  TWICE DAILY 180 capsule 3  . rivaroxaban (XARELTO) 20 MG TABS tablet Take 1 tablet (20 mg total) by mouth daily with supper. 90 tablet 3  . rosuvastatin (CRESTOR) 40 MG tablet TAKE 1 TABLET BY MOUTH IN  THE EVENING 90 tablet 3   No current facility-administered medications for this encounter.    No Known Allergies  Social History   Socioeconomic History  . Marital status: Divorced    Spouse name: Not on file  . Number of children: 3  . Years of education: 22yrs  . Highest education level: Bachelor's degree (e.g., BA, AB, BS)  Occupational History  . Occupation: retired    Fish farm manager: RETIRED  Tobacco Use  . Smoking status: Former Smoker    Packs/day: 2.00    Years: 20.00    Pack years: 40.00    Types: Cigarettes    Quit date: 07/19/1977    Years since quitting: 42.0  . Smokeless tobacco: Never Used  Substance and Sexual Activity  . Alcohol use: Yes    Alcohol/week: 7.0 standard drinks    Types: 7 Glasses of wine per week    Comment: dinner wine daily  . Drug use: No  . Sexual activity: Never  Other Topics Concern  . Not on file  Social History Narrative   Pt lives at home alone.    Caffeine Use: very little   Heart healthy diet, is planning exercise      Retired from Event organiser    Left handed   Social Determinants of Health   Financial Resource Strain:   . Difficulty of Paying Living Expenses: Not on file  Food Insecurity:   . Worried About Charity fundraiser in the Last Year: Not on file  . Ran Out of Food in the Last Year: Not on file  Transportation Needs:   . Lack of Transportation (Medical): Not on file  . Lack of Transportation (Non-Medical): Not on file  Physical Activity:   . Days of Exercise per Week: Not on file  . Minutes of Exercise per Session:  Not on file  Stress:   . Feeling of Stress : Not on file  Social Connections:   . Frequency of Communication with Friends and Family: Not on file  . Frequency of Social Gatherings with Friends and Family: Not on file  . Attends Religious Services: Not on file  . Active Member of Clubs or Organizations: Not on file  . Attends Archivist Meetings: Not on file  . Marital Status: Not on file  Intimate Partner Violence:   . Fear of Current or Ex-Partner: Not on file  . Emotionally Abused: Not on file  . Physically Abused: Not on file  . Sexually Abused: Not on file     ROS- All systems are reviewed and negative except as per the HPI above.  Physical Exam: Vitals:   08/02/19 0841  BP: 132/78  Pulse: 72  Weight: 95.3 kg  Height: 6\' 2"  (1.88 m)    GEN- The patient is well appearing elderly male, alert and oriented x 3 today.   Head- normocephalic, atraumatic Eyes-  Sclera clear, conjunctiva pink Ears- hearing intact Oropharynx- clear Neck- supple  Lungs- Clear to ausculation bilaterally, normal work of breathing Heart- irregular rate and rhythm, no murmurs, rubs or gallops  GI- soft, NT, ND, + BS Extremities- no clubbing, cyanosis, or edema MS- no significant deformity or atrophy Skin- no rash or lesion Psych- euthymic mood, full affect Neuro- strength and sensation are intact  Wt Readings from Last 3 Encounters:  08/02/19 95.3 kg  07/19/19 94.8 kg  07/10/19 94.8 kg    EKG  today demonstrates afib HR 72, QRS 82, QTc 424  Echo 07/19/19 demonstrated   1. Left ventricular ejection fraction, by visual estimation, is 60 to 65%. The left ventricle has normal function. There is no left ventricular hypertrophy.  2. Left ventricular diastolic parameters are consistent with Grade II diastolic dysfunction (pseudonormalization).  3. The left ventricle has no regional wall motion abnormalities.  4. Global right ventricle has normal systolic function.The right ventricular size is normal. No increase in right ventricular wall thickness.  5. Left atrial size was moderately dilated.  6. Right atrial size was mildly dilated.  7. The mitral valve is normal in structure. Mild mitral valve regurgitation. No evidence of mitral stenosis.  8. The tricuspid valve is normal in structure.  9. Aortic valve mean gradient measures 8.0 mmHg. 10. The aortic valve is normal in structure. Aortic valve regurgitation is mild. Mild aortic valve stenosis. 11. Pulmonic regurgitation is mild. 12. The pulmonic valve was normal in structure. Pulmonic valve regurgitation is mild. 13. Normal pulmonary artery systolic pressure. 14. The tricuspid regurgitant velocity is 2.30 m/s, and with an assumed right atrial pressure of 3 mmHg, the estimated right ventricular systolic pressure is normal at 24.2 mmHg. 15. The inferior vena cava is normal in size with greater than 50% respiratory variability, suggesting right atrial pressure of 3 mmHg.  Epic records are reviewed at length today  CHA2DS2-VASc Score = 6 The patient's score is based upon: CHF History: No HTN History: Yes Age : 37 + Diabetes History: No Stroke History: Yes Vascular Disease History: Yes Gender: Male      ASSESSMENT AND PLAN: 1. Persistent Atrial Fibrillation (ICD10:  I48.19) The patient's CHA2DS2-VASc score is 6, indicating a 9.7% annual risk of stroke.   General education about afib provided and questions answered.  We also  discussed his stroke risk and the risks and benefits of anticoagulation. Patient scheduled for DCCV 08/11/19. Continue  Toprol 25 mg daily Continue Xarelto 20 mg daily. Continue ASA per Dr [name] Cancel. CBC/Bmet today.  2. Secondary Hypercoagulable State (ICD10:  D68.69) The patient is at significant risk for stroke/thromboembolism based upon his CHA2DS2-VASc Score of 6.  Continue Rivaroxaban (Xarelto).   3. Obstructive sleep apnea The importance of adequate treatment of sleep apnea was discussed today in order to improve our ability to maintain sinus rhythm long term. Patient had previously been on an oral device but is now transitioning to a CPAP.  4. HTN Stable, no changes today.   Follow up one week post DCCV.    [facility] [address] [place], [place] [address] [phone] 08/02/2019 9:56 AM

## 2019-08-02 NOTE — Progress Notes (Signed)
Primary Care Physician: [name], MD Primary Cardiologist: Dr [name] Cancel Primary Electrophysiologist: none Referring Physician: Dr Shirlee More is a 85 y.o. male with a history of HTN, tobacco abuse quit 2011, CVA 2011, HLD, hypothyroidism, OSA, and new onset persistent atrial fibrillation who presents for consultation in the [facility].  The patient was initially diagnosed with atrial fibrillation 05/2019 at his PCP office after noting irregular readings on his FitBit. He is otherwise asymptomatic and rate controlled. Patient is on Xarelto for a CHADS2VASC score of 6. He is scheduled for DCCV on 08/11/19  Today, he denies symptoms of palpitations, chest pain, shortness of breath, orthopnea, PND, lower extremity edema, dizziness, presyncope, syncope, snoring, daytime somnolence, bleeding, or neurologic sequela. The patient is tolerating medications without difficulties and is otherwise without complaint today.    Atrial Fibrillation Risk Factors:  he does have symptoms or diagnosis of sleep apnea. he is compliant with oral device therapy. he does not have a history of rheumatic fever. he does have a history of alcohol use. The patient does not have a history of early familial atrial fibrillation or other arrhythmias.  he has a BMI of Body mass index is 26.96 kg/m.[name] Filed Weights   08/02/19 0841  Weight: 95.3 kg    Family History  Problem Relation Age of Onset  . Alzheimer's disease Mother   . Emphysema Sister        cigarettes  . Cancer Sister        lung, tobacco   . Heart disease Son   . Coronary artery disease Brother   . Heart attack Brother   . Other Brother        heart problems- from fathers side  . Cancer Brother 25       lung? smoker  . Heart disease Brother 77       Heart disease before age 54  . Cancer Sister        gyn  . Heart disease Sister   . Bipolar disorder Daughter   . Obesity Daughter   . Stroke  Father   . Heart disease Sister      Atrial Fibrillation Management history:  Previous antiarrhythmic drugs: none Previous cardioversions: none Previous ablations: none CHADS2VASC score: 6 Anticoagulation history: Xarelto   Past Medical History:  Diagnosis Date  . Allergy    seasonal- mostly spring  . Anemia 02/03/2011  . ASTHMA, CHILDHOOD 03/17/2010   mostly as child  . BCC (basal cell carcinoma) 12/08/2011  . CAROTID ARTERY OCCLUSION, WITH INFARCTION 03/17/2010  . CEREBROVASCULAR ACCIDENT 03/17/2010   partial loss of peripheral vision on left-"slight improved"  . CHICKENPOX, HX OF 03/17/2010  . Constipation 12/08/2014  . Epistaxis 09/06/2013  . FATIGUE 03/17/2010  . Hearing loss 11/04/2010   bilateral  . HYPERKALEMIA 05/27/2010  . Hyperlipidemia   . Hypertension   . Hypothyroid 12/08/2011  . Mixed hyperlipidemia 03/17/2010  . Oral lesion 11/20/2014  . Overweight(278.02) 07/01/2010  . Peripheral neuropathy 08/18/2016  . Personal history of other infectious and parasitic disease 03/17/2010  . Preventative health care 08/23/2016  . Skin lesion of right arm 11/20/2014  . Sleep apnea    uses mouthpiece only  . Thrombocytopenia ([place]) 04/05/2012  . TOBACCO ABUSE, HX OF 03/17/2010  . Tubular adenoma of colon 06/10/2011   Past Surgical History:  Procedure Laterality Date  . CAROTID ENDARTERECTOMY Left February 13, 2013   cea  . CATARACT EXTRACTION, BILATERAL Right  Cataract, and stigmatism  . COLONOSCOPY  Dec. 2014  . ENDARTERECTOMY Left 02/13/2013   Procedure: ENDARTERECTOMY CAROTID;  Surgeon: Rosetta Posner, MD;  Location: [facility];  Service: Vascular;  Laterality: Left;  . EUS N/A 08/17/2013   Procedure: UPPER ENDOSCOPIC ULTRASOUND (EUS) LINEAR;  Surgeon: Milus Banister, MD;  Location: [facility];  Service: Endoscopy;  Laterality: N/A;  . knee cartliage repair  1964, 1990   bilateral  . [place]   removed  . ROTATOR CUFF REPAIR     right  . TONSILLECTOMY    .  UPPER GI ENDOSCOPY  Jan. 2015    Current Outpatient Medications  Medication Sig Dispense Refill  . aspirin EC 81 MG tablet Take 81 mg by mouth daily.    . chlorthalidone (HYGROTON) 25 MG tablet TAKE 1 TABLET BY MOUTH IN  THE MORNING 90 tablet 3  . levothyroxine (SYNTHROID) 88 MCG tablet TAKE 1 TABLET BY MOUTH  DAILY BEFORE BREAKFAST 90 tablet 3  . LINZESS 145 MCG CAPS capsule TAKE 1 CAPSULE BY MOUTH  DAILY BEFORE BREAKFAST 90 capsule 0  . metoprolol succinate (TOPROL XL) 25 MG 24 hr tablet Take 1 tablet (25 mg total) by mouth daily. 90 tablet 3  . pantoprazole (PROTONIX) 40 MG tablet TAKE 1 TABLET BY MOUTH  DAILY 90 tablet 3  . ramipril (ALTACE) 10 MG capsule TAKE 1 CAPSULE BY MOUTH  TWICE DAILY 180 capsule 3  . rivaroxaban (XARELTO) 20 MG TABS tablet Take 1 tablet (20 mg total) by mouth daily with supper. 90 tablet 3  . rosuvastatin (CRESTOR) 40 MG tablet TAKE 1 TABLET BY MOUTH IN  THE EVENING 90 tablet 3   No current facility-administered medications for this encounter.    No Known Allergies  Social History   Socioeconomic History  . Marital status: Divorced    Spouse name: Not on file  . Number of children: 3  . Years of education: 71yrs  . Highest education level: Bachelor's degree (e.g., BA, AB, BS)  Occupational History  . Occupation: retired    Fish farm manager: RETIRED  Tobacco Use  . Smoking status: Former Smoker    Packs/day: 2.00    Years: 20.00    Pack years: 40.00    Types: Cigarettes    Quit date: 07/19/1977    Years since quitting: 42.0  . Smokeless tobacco: Never Used  Substance and Sexual Activity  . Alcohol use: Yes    Alcohol/week: 7.0 standard drinks    Types: 7 Glasses of wine per week    Comment: dinner wine daily  . Drug use: No  . Sexual activity: Never  Other Topics Concern  . Not on file  Social History Narrative   Pt lives at home alone.    Caffeine Use: very little   Heart healthy diet, is planning exercise      Retired from Event organiser    Left handed   Social Determinants of Health   Financial Resource Strain:   . Difficulty of Paying Living Expenses: Not on file  Food Insecurity:   . Worried About Charity fundraiser in the Last Year: Not on file  . Ran Out of Food in the Last Year: Not on file  Transportation Needs:   . Lack of Transportation (Medical): Not on file  . Lack of Transportation (Non-Medical): Not on file  Physical Activity:   . Days of Exercise per Week: Not on file  . Minutes of Exercise per Session:  Not on file  Stress:   . Feeling of Stress : Not on file  Social Connections:   . Frequency of Communication with Friends and Family: Not on file  . Frequency of Social Gatherings with Friends and Family: Not on file  . Attends Religious Services: Not on file  . Active Member of Clubs or Organizations: Not on file  . Attends Archivist Meetings: Not on file  . Marital Status: Not on file  Intimate Partner Violence:   . Fear of Current or Ex-Partner: Not on file  . Emotionally Abused: Not on file  . Physically Abused: Not on file  . Sexually Abused: Not on file     ROS- All systems are reviewed and negative except as per the HPI above.  Physical Exam: Vitals:   08/02/19 0841  BP: 132/78  Pulse: 72  Weight: 95.3 kg  Height: 6\' 2"  (1.88 m)    GEN- The patient is well appearing elderly male, alert and oriented x 3 today.   Head- normocephalic, atraumatic Eyes-  Sclera clear, conjunctiva pink Ears- hearing intact Oropharynx- clear Neck- supple  Lungs- Clear to ausculation bilaterally, normal work of breathing Heart- irregular rate and rhythm, no murmurs, rubs or gallops  GI- soft, NT, ND, + BS Extremities- no clubbing, cyanosis, or edema MS- no significant deformity or atrophy Skin- no rash or lesion Psych- euthymic mood, full affect Neuro- strength and sensation are intact  Wt Readings from Last 3 Encounters:  08/02/19 95.3 kg  07/19/19 94.8 kg  07/10/19 94.8 kg    EKG  today demonstrates afib HR 72, QRS 82, QTc 424  Echo 07/19/19 demonstrated   1. Left ventricular ejection fraction, by visual estimation, is 60 to 65%. The left ventricle has normal function. There is no left ventricular hypertrophy.  2. Left ventricular diastolic parameters are consistent with Grade II diastolic dysfunction (pseudonormalization).  3. The left ventricle has no regional wall motion abnormalities.  4. Global right ventricle has normal systolic function.The right ventricular size is normal. No increase in right ventricular wall thickness.  5. Left atrial size was moderately dilated.  6. Right atrial size was mildly dilated.  7. The mitral valve is normal in structure. Mild mitral valve regurgitation. No evidence of mitral stenosis.  8. The tricuspid valve is normal in structure.  9. Aortic valve mean gradient measures 8.0 mmHg. 10. The aortic valve is normal in structure. Aortic valve regurgitation is mild. Mild aortic valve stenosis. 11. Pulmonic regurgitation is mild. 12. The pulmonic valve was normal in structure. Pulmonic valve regurgitation is mild. 13. Normal pulmonary artery systolic pressure. 14. The tricuspid regurgitant velocity is 2.30 m/s, and with an assumed right atrial pressure of 3 mmHg, the estimated right ventricular systolic pressure is normal at 24.2 mmHg. 15. The inferior vena cava is normal in size with greater than 50% respiratory variability, suggesting right atrial pressure of 3 mmHg.  Epic records are reviewed at length today  CHA2DS2-VASc Score = 6 The patient's score is based upon: CHF History: No HTN History: Yes Age : 68 + Diabetes History: No Stroke History: Yes Vascular Disease History: Yes Gender: Male      ASSESSMENT AND PLAN: 1. Persistent Atrial Fibrillation (ICD10:  I48.19) The patient's CHA2DS2-VASc score is 6, indicating a 9.7% annual risk of stroke.   General education about afib provided and questions answered.  We also  discussed his stroke risk and the risks and benefits of anticoagulation. Patient scheduled for DCCV 08/11/19. Continue  Toprol 25 mg daily Continue Xarelto 20 mg daily. Continue ASA per Dr [name] Cancel. CBC/Bmet today.  2. Secondary Hypercoagulable State (ICD10:  D68.69) The patient is at significant risk for stroke/thromboembolism based upon his CHA2DS2-VASc Score of 6.  Continue Rivaroxaban (Xarelto).   3. Obstructive sleep apnea The importance of adequate treatment of sleep apnea was discussed today in order to improve our ability to maintain sinus rhythm long term. Patient had previously been on an oral device but is now transitioning to a CPAP.  4. HTN Stable, no changes today.   Follow up one week post DCCV.    [facility] [address] [place], [place] [address] [phone] 08/02/2019 9:56 AM

## 2019-08-02 NOTE — Patient Instructions (Addendum)
Cardioversion scheduled for Friday, January 22nd  - Arrive at the [facility] and go to admitting at 7:30AM  -Do not eat or drink anything after midnight the night prior to your procedure.  - Take all your morning medication with a sip of water prior to arrival.  - You will not be able to drive home after your procedure. Driver must be present.

## 2019-08-08 ENCOUNTER — Other Ambulatory Visit (HOSPITAL_COMMUNITY)
Admission: RE | Admit: 2019-08-08 | Discharge: 2019-08-08 | Disposition: A | Discharge status: Home / Self Care | Payer: Medicare Other | Source: Ambulatory Visit | Attending: Cardiovascular Disease | Admitting: Cardiovascular Disease

## 2019-08-08 DIAGNOSIS — Z01812 Encounter for preprocedural laboratory examination: Secondary | ICD-10-CM | POA: Insufficient documentation

## 2019-08-08 DIAGNOSIS — Z20822 Contact with and (suspected) exposure to covid-19: Secondary | ICD-10-CM | POA: Diagnosis not present

## 2019-08-09 ENCOUNTER — Ambulatory Visit: Payer: Medicare Other | Admitting: Pulmonary Disease

## 2019-08-09 LAB — NOVEL CORONAVIRUS, NAA (HOSP ORDER, SEND-OUT TO REF LAB; TAT 18-24 HRS): SARS-CoV-2, NAA: NOT DETECTED

## 2019-08-11 ENCOUNTER — Encounter (HOSPITAL_COMMUNITY): Payer: Self-pay | Admitting: Cardiovascular Disease

## 2019-08-11 ENCOUNTER — Other Ambulatory Visit: Payer: Self-pay

## 2019-08-11 ENCOUNTER — Ambulatory Visit (HOSPITAL_COMMUNITY): Payer: Medicare Other | Admitting: Anesthesiology

## 2019-08-11 ENCOUNTER — Encounter (HOSPITAL_COMMUNITY)
Admission: RE | Disposition: A | Discharge status: Home / Self Care | Payer: Medicare Other | Source: Home / Self Care | Attending: Cardiovascular Disease

## 2019-08-11 ENCOUNTER — Ambulatory Visit (HOSPITAL_COMMUNITY)
Admission: RE | Admit: 2019-08-11 | Discharge: 2019-08-11 | Disposition: A | Discharge status: Home / Self Care | Payer: Medicare Other | Attending: Cardiovascular Disease | Admitting: Cardiovascular Disease

## 2019-08-11 DIAGNOSIS — I4891 Unspecified atrial fibrillation: Secondary | ICD-10-CM | POA: Diagnosis not present

## 2019-08-11 DIAGNOSIS — H9193 Unspecified hearing loss, bilateral: Secondary | ICD-10-CM | POA: Insufficient documentation

## 2019-08-11 DIAGNOSIS — Z79890 Hormone replacement therapy (postmenopausal): Secondary | ICD-10-CM | POA: Insufficient documentation

## 2019-08-11 DIAGNOSIS — G629 Polyneuropathy, unspecified: Secondary | ICD-10-CM | POA: Insufficient documentation

## 2019-08-11 DIAGNOSIS — Z7901 Long term (current) use of anticoagulants: Secondary | ICD-10-CM | POA: Insufficient documentation

## 2019-08-11 DIAGNOSIS — Z8249 Family history of ischemic heart disease and other diseases of the circulatory system: Secondary | ICD-10-CM | POA: Diagnosis not present

## 2019-08-11 DIAGNOSIS — Z7982 Long term (current) use of aspirin: Secondary | ICD-10-CM | POA: Insufficient documentation

## 2019-08-11 DIAGNOSIS — Z87891 Personal history of nicotine dependence: Secondary | ICD-10-CM | POA: Diagnosis not present

## 2019-08-11 DIAGNOSIS — I4819 Other persistent atrial fibrillation: Secondary | ICD-10-CM | POA: Insufficient documentation

## 2019-08-11 DIAGNOSIS — Z79899 Other long term (current) drug therapy: Secondary | ICD-10-CM | POA: Diagnosis not present

## 2019-08-11 DIAGNOSIS — G4733 Obstructive sleep apnea (adult) (pediatric): Secondary | ICD-10-CM | POA: Diagnosis not present

## 2019-08-11 DIAGNOSIS — E039 Hypothyroidism, unspecified: Secondary | ICD-10-CM | POA: Insufficient documentation

## 2019-08-11 DIAGNOSIS — E782 Mixed hyperlipidemia: Secondary | ICD-10-CM | POA: Diagnosis not present

## 2019-08-11 DIAGNOSIS — I10 Essential (primary) hypertension: Secondary | ICD-10-CM | POA: Insufficient documentation

## 2019-08-11 HISTORY — PX: CARDIOVERSION: SHX1299

## 2019-08-11 SURGERY — CARDIOVERSION
Anesthesia: General

## 2019-08-11 MED ORDER — LIDOCAINE 2% (20 MG/ML) 5 ML SYRINGE
INTRAMUSCULAR | Status: DC | PRN
  Administered 2019-08-11: 60 mg via INTRAVENOUS

## 2019-08-11 MED ORDER — SODIUM CHLORIDE 0.9 % IV SOLN: INTRAVENOUS | Status: DC

## 2019-08-11 MED ORDER — PROPOFOL 10 MG/ML IV BOLUS
INTRAVENOUS | Status: DC | PRN
  Administered 2019-08-11: 60 mg via INTRAVENOUS
  Administered 2019-08-11: 40 mg via INTRAVENOUS

## 2019-08-11 MED ORDER — METOPROLOL SUCCINATE ER 25 MG PO TB24: 12.5000 mg | ORAL_TABLET | Freq: Every day | ORAL | 3 refills | Status: DC

## 2019-08-11 NOTE — Anesthesia Preprocedure Evaluation (Addendum)
Anesthesia Evaluation  Patient identified by MRN, date of birth, ID band Patient awake    Reviewed: Allergy & Precautions, NPO status , Patient's Chart, lab work & pertinent test results  Airway Mallampati: II  TM Distance: >3 FB Neck ROM: Full    Dental no notable dental hx.    Pulmonary asthma , sleep apnea and Continuous Positive Airway Pressure Ventilation , former smoker,    Pulmonary exam normal breath sounds clear to auscultation       Cardiovascular hypertension, Pt. on home beta blockers and Pt. on medications  Rhythm:Irregular Rate:Normal  ECG: a-fib, rate 72. LAD   Neuro/Psych Peripheral neuropathy  Neuromuscular disease CVA (vision in left eye), Residual Symptoms negative psych ROS   GI/Hepatic Neg liver ROS, GERD  Medicated and Controlled,  Endo/Other  Hypothyroidism   Renal/GU negative Renal ROS     Musculoskeletal negative musculoskeletal ROS (+)   Abdominal   Peds  Hematology HLD   Anesthesia Other Findings A-FIB  Reproductive/Obstetrics                            Anesthesia Physical Anesthesia Plan  ASA: III  Anesthesia Plan: General   Post-op Pain Management:    Induction: Intravenous  PONV Risk Score and Plan: 2 and Propofol infusion and Treatment may vary due to age or medical condition  Airway Management Planned: Mask  Additional Equipment:   Intra-op Plan:   Post-operative Plan:   Informed Consent: I have reviewed the patients History and Physical, chart, labs and discussed the procedure including the risks, benefits and alternatives for the proposed anesthesia with the patient or authorized representative who has indicated his/her understanding and acceptance.     Dental advisory given  Plan Discussed with: CRNA  Anesthesia Plan Comments:        Anesthesia Quick Evaluation

## 2019-08-11 NOTE — CV Procedure (Signed)
[place]: Anesthesia:  Propofol On Rx xarelto with no missed doses  DCC x 3 120 J then 200 J x 2 Converted to SR/SB rates 50-55 bpm  No immediate neurologic sequelae  Jenkins Rouge MD [facility]

## 2019-08-11 NOTE — Anesthesia Postprocedure Evaluation (Signed)
Anesthesia Post Note  Patient: Tanner [name]  Procedure(s) Performed: CARDIOVERSION (N/A )     Patient location during evaluation: Endoscopy Anesthesia Type: General Level of consciousness: awake and alert Pain management: pain level controlled Vital Signs Assessment: post-procedure vital signs reviewed and stable Respiratory status: spontaneous breathing, nonlabored ventilation, respiratory function stable and patient connected to nasal cannula oxygen Cardiovascular status: blood pressure returned to baseline and stable Postop Assessment: no apparent nausea or vomiting Anesthetic complications: no    Last Vitals:  Vitals:   08/11/19 0857 08/11/19 0907  BP: 113/60 (!) 125/52  Pulse: (!) 46 (!) 43  Resp: 17 16  Temp:    SpO2: 100% 100%    Last Pain:  Vitals:   08/11/19 0844  TempSrc: Temporal  PainSc:                  Karyl Kinnier Siah Kannan

## 2019-08-11 NOTE — Interval H&P Note (Signed)
History and Physical Interval Note:  08/11/2019 7:47 AM  Tanner [name]  has presented today for surgery, with the diagnosis of AFIB.  The various methods of treatment have been discussed with the patient and family. After consideration of risks, benefits and other options for treatment, the patient has consented to  Procedure(s): CARDIOVERSION (N/A) as a surgical intervention.  The patient's history has been reviewed, patient examined, no change in status, stable for surgery.  I have reviewed the patient's chart and labs.  Questions were answered to the patient's satisfaction.     Jenkins Rouge

## 2019-08-11 NOTE — Discharge Instructions (Signed)
Decrease Toprol to 1/2 tablet 12.5 mg daily start in am 08/12/19

## 2019-08-11 NOTE — Transfer of Care (Signed)
Immediate Anesthesia Transfer of Care Note  Patient: Tanner [name]  Procedure(s) Performed: CARDIOVERSION (N/A )  Patient Location: Endoscopy Unit  Anesthesia Type:General  Level of Consciousness: drowsy  Airway & Oxygen Therapy: Patient Spontanous Breathing and Patient connected to nasal cannula oxygen  Post-op Assessment: Report given to RN and Post -op Vital signs reviewed and stable  Post vital signs: Reviewed and stable  Last Vitals:  Vitals Value Taken Time  BP 112/59   Temp    Pulse 52   Resp 14   SpO2 95     Last Pain:  Vitals:   08/11/19 0715  TempSrc: Oral  PainSc: 0-No pain         Complications: No apparent anesthesia complications

## 2019-08-14 ENCOUNTER — Encounter: Payer: Self-pay | Admitting: *Deleted

## 2019-08-15 ENCOUNTER — Ambulatory Visit: Payer: Medicare Other | Admitting: Pulmonary Disease

## 2019-08-15 ENCOUNTER — Other Ambulatory Visit: Payer: Self-pay

## 2019-08-15 ENCOUNTER — Encounter: Payer: Self-pay | Admitting: Pulmonary Disease

## 2019-08-15 VITALS — BP 134/68 | HR 59 | Temp 97.2°F | Ht 74.0 in | Wt 208.8 lb

## 2019-08-15 DIAGNOSIS — G4733 Obstructive sleep apnea (adult) (pediatric): Secondary | ICD-10-CM

## 2019-08-15 NOTE — Patient Instructions (Signed)
Moderate obstructive sleep apnea-adequately treated with CPAP therapy  Compliance data shows you may benefit from increasing pressures  We will ask the medical supply company to increase your pressure from 5-15 to 10-18  Call if you are not tolerating this pressure well  I will see you back physically in the office in about 6 months  We will try and get another compliance data in about a month

## 2019-08-15 NOTE — Progress Notes (Signed)
Tanner [name]    [id number]    27-Mar-1935  Primary Care Physician:Blyth, Bonnita Levan, MD  Referring Physician: [name], MD [address] [place],  [place] [address]  Chief complaint:   Patient with a history of obstructive sleep apnea diagnosed with mild obstructive sleep apnea in 2014 In for follow-up today  HPI: Has been using CPAP on a regular basis Compliant with treatment Generally feels better No chest pains or chest discomfort Wakes up feeling like he is at a gross nights rest  Historically He has been using an oral device since 2014 Device was upgraded about 4 years after initial start date He has had multiple evaluations recently with concerns that the oral device was not working to treat his sleep disordered breathing He stated he has had at least 4 studies with the oral device in place Dr. Ron Parker did get concerned about the device not been very efficient in treating his sleep disordered breathing  He has a history of CVAs, coronary artery disease  Usually goes to bed between 10 and 11:30 PM, takes him about 20 minutes to fall asleep Wakes up about 2-4 times during the night Final wake up time between 7 and 8 AM  He is concerned he may not be able to tolerate CPAP therapy   Outpatient Encounter Medications as of 08/15/2019  Medication Sig  . acetaminophen (TYLENOL) 500 MG tablet Take 1,000 mg by mouth daily as needed for moderate pain or headache.  [name] aspirin EC 81 MG tablet Take 81 mg by mouth every evening.   . chlorthalidone (HYGROTON) 25 MG tablet TAKE 1 TABLET BY MOUTH IN  THE MORNING (Patient taking differently: Take 25 mg by mouth daily. )  . levothyroxine (SYNTHROID) 88 MCG tablet TAKE 1 TABLET BY MOUTH  DAILY BEFORE BREAKFAST (Patient taking differently: Take 88 mcg by mouth daily before breakfast. )  . LINZESS 145 MCG CAPS capsule TAKE 1 CAPSULE BY MOUTH  DAILY BEFORE BREAKFAST (Patient taking differently: Take 145 mcg by  mouth daily before breakfast. )  . metoprolol succinate (TOPROL XL) 25 MG 24 hr tablet Take 0.5 tablets (12.5 mg total) by mouth daily.  . pantoprazole (PROTONIX) 40 MG tablet TAKE 1 TABLET BY MOUTH  DAILY (Patient taking differently: Take 40 mg by mouth every evening. )  . ramipril (ALTACE) 10 MG capsule TAKE 1 CAPSULE BY MOUTH  TWICE DAILY (Patient taking differently: Take 10 mg by mouth 2 (two) times daily. )  . rivaroxaban (XARELTO) 20 MG TABS tablet Take 1 tablet (20 mg total) by mouth daily with supper.  . rosuvastatin (CRESTOR) 40 MG tablet TAKE 1 TABLET BY MOUTH IN  THE EVENING (Patient taking differently: Take 40 mg by mouth every evening. )  . sodium chloride (OCEAN) 0.65 % SOLN nasal spray Place 1 spray into both nostrils as needed for congestion.  . [DISCONTINUED] metoprolol succinate (TOPROL XL) 25 MG 24 hr tablet Take 1 tablet (25 mg total) by mouth daily.   No facility-administered encounter medications on file as of 08/15/2019.    Allergies as of 08/15/2019  . (No Known Allergies)    Past Medical History:  Diagnosis Date  . Allergy    seasonal- mostly spring  . Anemia 02/03/2011  . ASTHMA, CHILDHOOD 03/17/2010   mostly as child  . BCC (basal cell carcinoma) 12/08/2011  . CAROTID ARTERY OCCLUSION, WITH INFARCTION 03/17/2010  . CEREBROVASCULAR ACCIDENT 03/17/2010  partial loss of peripheral vision on left-"slight improved"  . CHICKENPOX, HX OF 03/17/2010  . Constipation 12/08/2014  . Epistaxis 09/06/2013  . FATIGUE 03/17/2010  . Hearing loss 11/04/2010   bilateral  . HYPERKALEMIA 05/27/2010  . Hyperlipidemia   . Hypertension   . Hypothyroid 12/08/2011  . Mixed hyperlipidemia 03/17/2010  . Oral lesion 11/20/2014  . Overweight(278.02) 07/01/2010  . Peripheral neuropathy 08/18/2016  . Personal history of other infectious and parasitic disease 03/17/2010  . Preventative health care 08/23/2016  . Skin lesion of right arm 11/20/2014  . Sleep apnea    uses mouthpiece only  .  Thrombocytopenia ([place]) 04/05/2012  . TOBACCO ABUSE, HX OF 03/17/2010  . Tubular adenoma of colon 06/10/2011    Past Surgical History:  Procedure Laterality Date  . CARDIOVERSION N/A 08/11/2019   Procedure: CARDIOVERSION;  Surgeon: Josue Hector, MD;  Location: [facility] ENDOSCOPY;  Service: Cardiovascular;  Laterality: N/A;  . CAROTID ENDARTERECTOMY Left February 13, 2013   cea  . CATARACT EXTRACTION, BILATERAL Right    Cataract, and stigmatism  . COLONOSCOPY  Dec. 2014  . ENDARTERECTOMY Left 02/13/2013   Procedure: ENDARTERECTOMY CAROTID;  Surgeon: Rosetta Posner, MD;  Location: [facility];  Service: Vascular;  Laterality: Left;  . EUS N/A 08/17/2013   Procedure: UPPER ENDOSCOPIC ULTRASOUND (EUS) LINEAR;  Surgeon: Milus Banister, MD;  Location: [facility] ENDOSCOPY;  Service: Endoscopy;  Laterality: N/A;  . knee cartliage repair  1964, 1990   bilateral  . [place]   removed  . ROTATOR CUFF REPAIR     right  . TONSILLECTOMY    . UPPER GI ENDOSCOPY  Jan. 2015    Family History  Problem Relation Age of Onset  . Alzheimer's disease Mother   . Emphysema Sister        cigarettes  . Cancer Sister        lung, tobacco   . Heart disease Son   . Coronary artery disease Brother   . Heart attack Brother   . Other Brother        heart problems- from fathers side  . Cancer Brother 84       lung? smoker  . Heart disease Brother 33       Heart disease before age 6  . Cancer Sister        gyn  . Heart disease Sister   . Bipolar disorder Daughter   . Obesity Daughter   . Stroke Father   . Heart disease Sister     Social History   Socioeconomic History  . Marital status: Divorced    Spouse name: Not on file  . Number of children: 3  . Years of education: 7yrs  . Highest education level: Bachelor's degree (e.g., BA, AB, BS)  Occupational History  . Occupation: retired    Fish farm manager: RETIRED  Tobacco Use  . Smoking status: Former Smoker    Packs/day: 2.00    Years: 20.00     Pack years: 40.00    Types: Cigarettes    Quit date: 07/19/1977    Years since quitting: 42.1  . Smokeless tobacco: Never Used  Substance and Sexual Activity  . Alcohol use: Yes    Alcohol/week: 7.0 standard drinks    Types: 7 Glasses of wine per week    Comment: dinner wine daily  . Drug use: No  . Sexual activity: Never  Other Topics Concern  . Not on file  Social History Narrative  Pt lives at home alone.    Caffeine Use: very little   Heart healthy diet, is planning exercise      Retired from Event organiser   Left handed   Social Determinants of Health   Financial Resource Strain:   . Difficulty of Paying Living Expenses: Not on file  Food Insecurity:   . Worried About Charity fundraiser in the Last Year: Not on file  . Ran Out of Food in the Last Year: Not on file  Transportation Needs:   . Lack of Transportation (Medical): Not on file  . Lack of Transportation (Non-Medical): Not on file  Physical Activity:   . Days of Exercise per Week: Not on file  . Minutes of Exercise per Session: Not on file  Stress:   . Feeling of Stress : Not on file  Social Connections:   . Frequency of Communication with Friends and Family: Not on file  . Frequency of Social Gatherings with Friends and Family: Not on file  . Attends Religious Services: Not on file  . Active Member of Clubs or Organizations: Not on file  . Attends Archivist Meetings: Not on file  . Marital Status: Not on file  Intimate Partner Violence:   . Fear of Current or Ex-Partner: Not on file  . Emotionally Abused: Not on file  . Physically Abused: Not on file  . Sexually Abused: Not on file    Review of Systems  Constitutional: Negative for fatigue.  HENT: Negative.   Respiratory: Positive for apnea.   Cardiovascular: Negative.   Gastrointestinal: Negative.   Psychiatric/Behavioral: Positive for sleep disturbance.    Vitals:   08/15/19 0933  BP: 134/68  Pulse: (!) 59  Temp: (!) 97.2 F  (36.2 C)  SpO2: 98%    Physical Exam  Constitutional: He appears well-developed and well-nourished.  HENT:  Head: Normocephalic and atraumatic.  Eyes: Pupils are equal, round, and reactive to light. Right eye exhibits no discharge. Left eye exhibits no discharge.  Neck: No tracheal deviation present. No thyromegaly present.  Cardiovascular: Normal rate and regular rhythm.  Pulmonary/Chest: Effort normal and breath sounds normal. No respiratory distress. He has no wheezes. He has no rales.  Abdominal: Soft. Bowel sounds are normal. He exhibits no distension. There is no abdominal tenderness.  Musculoskeletal:     Cervical back: Normal range of motion and neck supple.  Psychiatric: He has a normal mood and affect.   Results of the Epworth flowsheet 05/17/2019  Sitting and reading 2  Watching TV 1  Sitting, inactive in a public place (e.g. a theatre or a meeting) 0  As a passenger in a car for an hour without a break 1  Lying down to rest in the afternoon when circumstances permit 1  Sitting and talking to someone 0  Sitting quietly after a lunch without alcohol 0  In a car, while stopped for a few minutes in traffic 0  Total score 5     Data Reviewed: Previous sleep study from 2014 reviewed showing mild obstructive sleep apnea  Compliance data reviewed -May benefit from pressure change -100% compliance Average use 7 hours 33 minutes Pressure setting 5-15 95 percentile pressure 14.9 with maximum pressure of 15 AHI of 7.7  Assessment:  .  Mild obstructive sleep apnea -Adequately treated with CPAP therapy  .  Pathophysiology of sleep disordered breathing discussed with the patient  .  Treatment options for sleep disordered breathing discussed with the patient  .  History of coronary artery disease  .  History of CVA  Plan/Recommendations:  Encouraged to continue using CPAP on a regular basis  We will adjust pressures from 5-20, to 10-18  I will follow-up with  him in about 6 months  He is encouraged to call with any significant concerns  Continue lines of care  Sherrilyn Rist MD [facility] 08/15/2019, 9:46 AM  CC: [name], MD

## 2019-08-18 ENCOUNTER — Ambulatory Visit (HOSPITAL_COMMUNITY)
Admission: RE | Admit: 2019-08-18 | Discharge: 2019-08-18 | Disposition: A | Discharge status: Home / Self Care | Payer: Medicare Other | Source: Ambulatory Visit | Attending: Physician Assistant | Admitting: Physician Assistant

## 2019-08-18 ENCOUNTER — Other Ambulatory Visit: Payer: Self-pay

## 2019-08-18 ENCOUNTER — Encounter (HOSPITAL_COMMUNITY): Payer: Self-pay | Admitting: Physician Assistant

## 2019-08-18 VITALS — BP 138/74 | HR 53 | Ht 74.0 in | Wt 210.2 lb

## 2019-08-18 DIAGNOSIS — Z7901 Long term (current) use of anticoagulants: Secondary | ICD-10-CM | POA: Diagnosis not present

## 2019-08-18 DIAGNOSIS — E782 Mixed hyperlipidemia: Secondary | ICD-10-CM | POA: Insufficient documentation

## 2019-08-18 DIAGNOSIS — Z79899 Other long term (current) drug therapy: Secondary | ICD-10-CM | POA: Diagnosis not present

## 2019-08-18 DIAGNOSIS — I63239 Cerebral infarction due to unspecified occlusion or stenosis of unspecified carotid arteries: Secondary | ICD-10-CM | POA: Insufficient documentation

## 2019-08-18 DIAGNOSIS — Z7982 Long term (current) use of aspirin: Secondary | ICD-10-CM | POA: Insufficient documentation

## 2019-08-18 DIAGNOSIS — I4819 Other persistent atrial fibrillation: Secondary | ICD-10-CM | POA: Insufficient documentation

## 2019-08-18 DIAGNOSIS — I10 Essential (primary) hypertension: Secondary | ICD-10-CM | POA: Insufficient documentation

## 2019-08-18 DIAGNOSIS — H9193 Unspecified hearing loss, bilateral: Secondary | ICD-10-CM | POA: Diagnosis not present

## 2019-08-18 DIAGNOSIS — G4733 Obstructive sleep apnea (adult) (pediatric): Secondary | ICD-10-CM | POA: Diagnosis not present

## 2019-08-18 DIAGNOSIS — D6869 Other thrombophilia: Secondary | ICD-10-CM | POA: Insufficient documentation

## 2019-08-18 DIAGNOSIS — D649 Anemia, unspecified: Secondary | ICD-10-CM | POA: Insufficient documentation

## 2019-08-18 DIAGNOSIS — Z8249 Family history of ischemic heart disease and other diseases of the circulatory system: Secondary | ICD-10-CM | POA: Insufficient documentation

## 2019-08-18 DIAGNOSIS — Z87891 Personal history of nicotine dependence: Secondary | ICD-10-CM | POA: Insufficient documentation

## 2019-08-18 DIAGNOSIS — E875 Hyperkalemia: Secondary | ICD-10-CM | POA: Insufficient documentation

## 2019-08-18 DIAGNOSIS — E039 Hypothyroidism, unspecified: Secondary | ICD-10-CM | POA: Diagnosis not present

## 2019-08-18 DIAGNOSIS — Z8673 Personal history of transient ischemic attack (TIA), and cerebral infarction without residual deficits: Secondary | ICD-10-CM | POA: Diagnosis not present

## 2019-08-18 NOTE — Progress Notes (Signed)
Primary Care Physician: [name], MD Primary Cardiologist: Dr [name] Cancel Primary Electrophysiologist: none Referring Physician: Dr Shirlee More is a 85 y.o. male with a history of HTN, tobacco abuse quit 2011, CVA 2011, HLD, hypothyroidism, OSA, and new onset persistent atrial fibrillation who presents for consultation in the [facility].  The patient was initially diagnosed with atrial fibrillation 05/2019 at his PCP office after noting irregular readings on his FitBit. He is otherwise asymptomatic and rate controlled. Patient is on Xarelto for a CHADS2VASC score of 6.   On follow up today, patient is s/p DCCV on 08/11/19. Patient reports that he has done well since his procedure. His FitBit watch has shown heart rates in the 50s-60s. He is tolerating anticoagulation without bleeding issues.   Today, he denies symptoms of palpitations, chest pain, shortness of breath, orthopnea, PND, lower extremity edema, dizziness, presyncope, syncope, snoring, daytime somnolence, bleeding, or neurologic sequela. The patient is tolerating medications without difficulties and is otherwise without complaint today.    Atrial Fibrillation Risk Factors:  he does have symptoms or diagnosis of sleep apnea. he is compliant with oral device therapy. he does not have a history of rheumatic fever. he does have a history of alcohol use. The patient does not have a history of early familial atrial fibrillation or other arrhythmias.  he has a BMI of Body mass index is 26.99 kg/m.[name] Filed Weights   08/18/19 1040  Weight: 95.3 kg    Family History  Problem Relation Age of Onset  . Alzheimer's disease Mother   . Emphysema Sister        cigarettes  . Cancer Sister        lung, tobacco   . Heart disease Son   . Coronary artery disease Brother   . Heart attack Brother   . Other Brother        heart problems- from fathers side  . Cancer Brother 64       lung?  smoker  . Heart disease Brother 41       Heart disease before age 71  . Cancer Sister        gyn  . Heart disease Sister   . Bipolar disorder Daughter   . Obesity Daughter   . Stroke Father   . Heart disease Sister      Atrial Fibrillation Management history:  Previous antiarrhythmic drugs: none Previous cardioversions: 08/11/19 Previous ablations: none CHADS2VASC score: 6 Anticoagulation history: Xarelto   Past Medical History:  Diagnosis Date  . Allergy    seasonal- mostly spring  . Anemia 02/03/2011  . ASTHMA, CHILDHOOD 03/17/2010   mostly as child  . BCC (basal cell carcinoma) 12/08/2011  . CAROTID ARTERY OCCLUSION, WITH INFARCTION 03/17/2010  . CEREBROVASCULAR ACCIDENT 03/17/2010   partial loss of peripheral vision on left-"slight improved"  . CHICKENPOX, HX OF 03/17/2010  . Constipation 12/08/2014  . Epistaxis 09/06/2013  . FATIGUE 03/17/2010  . Hearing loss 11/04/2010   bilateral  . HYPERKALEMIA 05/27/2010  . Hyperlipidemia   . Hypertension   . Hypothyroid 12/08/2011  . Mixed hyperlipidemia 03/17/2010  . Oral lesion 11/20/2014  . Overweight(278.02) 07/01/2010  . Peripheral neuropathy 08/18/2016  . Personal history of other infectious and parasitic disease 03/17/2010  . Preventative health care 08/23/2016  . Skin lesion of right arm 11/20/2014  . Sleep apnea    uses mouthpiece only  . Thrombocytopenia ([place]) 04/05/2012  . TOBACCO ABUSE, HX OF 03/17/2010  .  Tubular adenoma of colon 06/10/2011   Past Surgical History:  Procedure Laterality Date  . CARDIOVERSION N/A 08/11/2019   Procedure: CARDIOVERSION;  Surgeon: Josue Hector, MD;  Location: [facility] ENDOSCOPY;  Service: Cardiovascular;  Laterality: N/A;  . CAROTID ENDARTERECTOMY Left February 13, 2013   cea  . CATARACT EXTRACTION, BILATERAL Right    Cataract, and stigmatism  . COLONOSCOPY  Dec. 2014  . ENDARTERECTOMY Left 02/13/2013   Procedure: ENDARTERECTOMY CAROTID;  Surgeon: Rosetta Posner, MD;  Location: [facility];  Service:  Vascular;  Laterality: Left;  . EUS N/A 08/17/2013   Procedure: UPPER ENDOSCOPIC ULTRASOUND (EUS) LINEAR;  Surgeon: Milus Banister, MD;  Location: [facility] ENDOSCOPY;  Service: Endoscopy;  Laterality: N/A;  . knee cartliage repair  1964, 1990   bilateral  . [place]   removed  . ROTATOR CUFF REPAIR     right  . TONSILLECTOMY    . UPPER GI ENDOSCOPY  Jan. 2015    Current Outpatient Medications  Medication Sig Dispense Refill  . acetaminophen (TYLENOL) 500 MG tablet Take 1,000 mg by mouth daily as needed for moderate pain or headache.    [name] aspirin EC 81 MG tablet Take 81 mg by mouth every evening.     . chlorthalidone (HYGROTON) 25 MG tablet TAKE 1 TABLET BY MOUTH IN  THE MORNING (Patient taking differently: Take 25 mg by mouth daily. ) 90 tablet 3  . levothyroxine (SYNTHROID) 88 MCG tablet TAKE 1 TABLET BY MOUTH  DAILY BEFORE BREAKFAST (Patient taking differently: Take 88 mcg by mouth daily before breakfast. ) 90 tablet 3  . LINZESS 145 MCG CAPS capsule TAKE 1 CAPSULE BY MOUTH  DAILY BEFORE BREAKFAST (Patient taking differently: Take 145 mcg by mouth daily before breakfast. ) 90 capsule 0  . metoprolol succinate (TOPROL XL) 25 MG 24 hr tablet Take 0.5 tablets (12.5 mg total) by mouth daily. 90 tablet 3  . pantoprazole (PROTONIX) 40 MG tablet TAKE 1 TABLET BY MOUTH  DAILY (Patient taking differently: Take 40 mg by mouth every evening. ) 90 tablet 3  . ramipril (ALTACE) 10 MG capsule TAKE 1 CAPSULE BY MOUTH  TWICE DAILY (Patient taking differently: Take 10 mg by mouth 2 (two) times daily. ) 180 capsule 3  . rivaroxaban (XARELTO) 20 MG TABS tablet Take 1 tablet (20 mg total) by mouth daily with supper. 90 tablet 3  . rosuvastatin (CRESTOR) 40 MG tablet TAKE 1 TABLET BY MOUTH IN  THE EVENING (Patient taking differently: Take 40 mg by mouth every evening. ) 90 tablet 3  . sodium chloride (OCEAN) 0.65 % SOLN nasal spray Place 1 spray into both nostrils as needed for congestion.      No current facility-administered medications for this encounter.    No Known Allergies  Social History   Socioeconomic History  . Marital status: Divorced    Spouse name: Not on file  . Number of children: 3  . Years of education: 87yrs  . Highest education level: Bachelor's degree (e.g., BA, AB, BS)  Occupational History  . Occupation: retired    Fish farm manager: RETIRED  Tobacco Use  . Smoking status: Former Smoker    Packs/day: 2.00    Years: 20.00    Pack years: 40.00    Types: Cigarettes    Quit date: 07/19/1977    Years since quitting: 42.1  . Smokeless tobacco: Never Used  Substance and Sexual Activity  . Alcohol use: Yes    Alcohol/week: 7.0  standard drinks    Types: 7 Glasses of wine per week    Comment: dinner wine daily  . Drug use: No  . Sexual activity: Never  Other Topics Concern  . Not on file  Social History Narrative   Pt lives at home alone.    Caffeine Use: very little   Heart healthy diet, is planning exercise      Retired from Event organiser   Left handed   Social Determinants of Health   Financial Resource Strain:   . Difficulty of Paying Living Expenses: Not on file  Food Insecurity:   . Worried About Charity fundraiser in the Last Year: Not on file  . Ran Out of Food in the Last Year: Not on file  Transportation Needs:   . Lack of Transportation (Medical): Not on file  . Lack of Transportation (Non-Medical): Not on file  Physical Activity:   . Days of Exercise per Week: Not on file  . Minutes of Exercise per Session: Not on file  Stress:   . Feeling of Stress : Not on file  Social Connections:   . Frequency of Communication with Friends and Family: Not on file  . Frequency of Social Gatherings with Friends and Family: Not on file  . Attends Religious Services: Not on file  . Active Member of Clubs or Organizations: Not on file  . Attends Archivist Meetings: Not on file  . Marital Status: Not on file  Intimate Partner  Violence:   . Fear of Current or Ex-Partner: Not on file  . Emotionally Abused: Not on file  . Physically Abused: Not on file  . Sexually Abused: Not on file     ROS- All systems are reviewed and negative except as per the HPI above.  Physical Exam: Vitals:   08/18/19 1040  BP: 138/74  Pulse: (!) 53  Weight: 95.3 kg  Height: 6\' 2"  (1.88 m)    GEN- The patient is well appearing elderly male, alert and oriented x 3 today.   HEENT-head normocephalic, atraumatic, sclera clear, conjunctiva pink, hearing intact, trachea midline. Lungs- Clear to ausculation bilaterally, normal work of breathing Heart- Regular rate and rhythm, no murmurs, rubs or gallops  GI- soft, NT, ND, + BS Extremities- no clubbing, cyanosis, or edema MS- no significant deformity or atrophy Skin- no rash or lesion Psych- euthymic mood, full affect Neuro- strength and sensation are intact   Wt Readings from Last 3 Encounters:  08/18/19 95.3 kg  08/15/19 94.7 kg  08/11/19 95.3 kg    EKG today demonstrates SB HR 53, PR 206, QRS 86, QTc 397  Echo 07/19/19 demonstrated   1. Left ventricular ejection fraction, by visual estimation, is 60 to 65%. The left ventricle has normal function. There is no left ventricular hypertrophy.  2. Left ventricular diastolic parameters are consistent with Grade II diastolic dysfunction (pseudonormalization).  3. The left ventricle has no regional wall motion abnormalities.  4. Global right ventricle has normal systolic function.The right ventricular size is normal. No increase in right ventricular wall thickness.  5. Left atrial size was moderately dilated.  6. Right atrial size was mildly dilated.  7. The mitral valve is normal in structure. Mild mitral valve regurgitation. No evidence of mitral stenosis.  8. The tricuspid valve is normal in structure.  9. Aortic valve mean gradient measures 8.0 mmHg. 10. The aortic valve is normal in structure. Aortic valve regurgitation is  mild. Mild aortic valve stenosis. 11. Pulmonic  regurgitation is mild. 12. The pulmonic valve was normal in structure. Pulmonic valve regurgitation is mild. 13. Normal pulmonary artery systolic pressure. 14. The tricuspid regurgitant velocity is 2.30 m/s, and with an assumed right atrial pressure of 3 mmHg, the estimated right ventricular systolic pressure is normal at 24.2 mmHg. 15. The inferior vena cava is normal in size with greater than 50% respiratory variability, suggesting right atrial pressure of 3 mmHg.  Epic records are reviewed at length today  CHA2DS2-VASc Score = 6 The patient's score is based upon: CHF History: No HTN History: Yes Age : 45 + Diabetes History: No Stroke History: Yes Vascular Disease History: Yes Gender: Male     ASSESSMENT AND PLAN: 1. Persistent Atrial Fibrillation (ICD10:  I48.19) The patient's CHA2DS2-VASc score is 6, indicating a 9.7% annual risk of stroke.   S/p DCCV 08/11/19 Patient appears to be maintaining SR. Continue Toprol 12.5 mg daily Continue Xarelto 20 mg daily. Continue ASA per Dr [name] Cancel.  2. Secondary Hypercoagulable State (ICD10:  D68.69) The patient is at significant risk for stroke/thromboembolism based upon his CHA2DS2-VASc Score of 6.  Continue Rivaroxaban (Xarelto).   3. Obstructive sleep apnea The importance of adequate treatment of sleep apnea was discussed today in order to improve our ability to maintain sinus rhythm long term. Patient compliant with CPAP. Followed by Dr Ander Slade.  4. HTN Stable, no changes today.   Follow up in the AF clinic in 3 months.    [facility] [address] [place],  [address] [phone] 08/18/2019 10:54 AM

## 2019-09-15 ENCOUNTER — Ambulatory Visit: Payer: Medicare Other | Admitting: Neurology

## 2019-10-05 ENCOUNTER — Ambulatory Visit (INDEPENDENT_AMBULATORY_CARE_PROVIDER_SITE_OTHER): Payer: Medicare Other | Admitting: Family Medicine

## 2019-10-05 ENCOUNTER — Telehealth: Payer: Self-pay

## 2019-10-05 ENCOUNTER — Other Ambulatory Visit: Payer: Self-pay

## 2019-10-05 DIAGNOSIS — E039 Hypothyroidism, unspecified: Secondary | ICD-10-CM | POA: Diagnosis not present

## 2019-10-05 DIAGNOSIS — I10 Essential (primary) hypertension: Secondary | ICD-10-CM | POA: Diagnosis not present

## 2019-10-05 DIAGNOSIS — E782 Mixed hyperlipidemia: Secondary | ICD-10-CM | POA: Diagnosis not present

## 2019-10-05 DIAGNOSIS — R739 Hyperglycemia, unspecified: Secondary | ICD-10-CM

## 2019-10-05 LAB — COMPREHENSIVE METABOLIC PANEL
ALT: 17 U/L (ref 0–53)
AST: 19 U/L (ref 0–37)
Albumin: 4.5 g/dL (ref 3.5–5.2)
Alkaline Phosphatase: 66 U/L (ref 39–117)
BUN: 19 mg/dL (ref 6–23)
CO2: 33 mEq/L — ABNORMAL HIGH (ref 19–32)
Calcium: 9.4 mg/dL (ref 8.4–10.5)
Chloride: 101 mEq/L (ref 96–112)
Creatinine, Ser: 1.05 mg/dL (ref 0.40–1.50)
GFR: 67.5 mL/min (ref >60.00)
Glucose, Bld: 89 mg/dL (ref 70–99)
Potassium: 4.2 mEq/L (ref 3.5–5.1)
Sodium: 139 mEq/L (ref 135–145)
Total Bilirubin: 1 mg/dL (ref 0.2–1.2)
Total Protein: 7.1 g/dL (ref 6.0–8.3)

## 2019-10-05 LAB — HEMOGLOBIN A1C: Hgb A1c MFr Bld: 5.7 % (ref 4.6–6.5)

## 2019-10-05 LAB — TSH: TSH: 4.58 u[IU]/mL — ABNORMAL HIGH (ref 0.35–4.50)

## 2019-10-05 LAB — LIPID PANEL
Cholesterol: 164 mg/dL (ref 0–200)
HDL: 52.1 mg/dL (ref >39.00)
LDL Cholesterol: 92 mg/dL (ref 0–99)
NonHDL: 112.19
Total CHOL/HDL Ratio: 3
Triglycerides: 100 mg/dL (ref 0.0–149.0)
VLDL: 20 mg/dL (ref 0.0–40.0)

## 2019-10-05 NOTE — Telephone Encounter (Signed)
[facility] lab called and said they were unable to get CBC due to platelets clumping from today's lab visit. They are asking to redraw a Purple top AND a blue top tube. Pt was called, message was left asking him to call back to schedule just lab appt. Pt advised this could be done in [place] or [place].   re

## 2019-10-06 ENCOUNTER — Other Ambulatory Visit: Payer: Self-pay | Admitting: Internal Medicine

## 2019-10-06 LAB — CBC
HCT: 42.3 % (ref 39.0–52.0)
Hemoglobin: 14.1 g/dL (ref 13.0–17.0)
MCHC: 33.4 g/dL (ref 30.0–36.0)
MCV: 95.4 fl (ref 78.0–100.0)
Platelets: 144 10*3/uL — ABNORMAL LOW (ref 150.0–400.0)
RBC: 4.43 Mil/uL (ref 4.22–5.81)
RDW: 15 % (ref 11.5–15.5)
WBC: 5.7 10*3/uL (ref 4.0–10.5)

## 2019-10-06 NOTE — Telephone Encounter (Signed)
Contacted patient, he is returning today for redraw of CBC along with Blue top tube ( per lab ).   Had to enter additional order for CBC for patient, but per Santiago Glad at the lab the patient will not be charged for the test twice.   FYI.

## 2019-10-06 NOTE — Addendum Note (Signed)
Addended by: Ralph Dowdy on: 10/06/2019 09:32 AM   Modules accepted: Orders

## 2019-10-06 NOTE — Addendum Note (Signed)
Addended by: Ralph Dowdy on: 10/06/2019 09:38 AM   Modules accepted: Orders

## 2019-10-11 ENCOUNTER — Other Ambulatory Visit: Payer: Self-pay

## 2019-10-12 ENCOUNTER — Ambulatory Visit (INDEPENDENT_AMBULATORY_CARE_PROVIDER_SITE_OTHER): Payer: Medicare Other | Admitting: Family Medicine

## 2019-10-12 VITALS — BP 133/71 | HR 55 | Temp 97.3°F | Resp 16 | Ht 74.0 in | Wt 212.0 lb

## 2019-10-12 DIAGNOSIS — R739 Hyperglycemia, unspecified: Secondary | ICD-10-CM | POA: Diagnosis not present

## 2019-10-12 DIAGNOSIS — Z0000 Encounter for general adult medical examination without abnormal findings: Secondary | ICD-10-CM | POA: Diagnosis not present

## 2019-10-12 DIAGNOSIS — I4819 Other persistent atrial fibrillation: Secondary | ICD-10-CM

## 2019-10-12 DIAGNOSIS — I10 Essential (primary) hypertension: Secondary | ICD-10-CM

## 2019-10-12 DIAGNOSIS — E782 Mixed hyperlipidemia: Secondary | ICD-10-CM

## 2019-10-12 DIAGNOSIS — E039 Hypothyroidism, unspecified: Secondary | ICD-10-CM

## 2019-10-12 MED ORDER — LINACLOTIDE 145 MCG PO CAPS: ORAL_CAPSULE | ORAL | 3 refills | Status: DC

## 2019-10-12 MED ORDER — METOPROLOL SUCCINATE ER 25 MG PO TB24: 12.5000 mg | ORAL_TABLET | Freq: Every day | ORAL | 3 refills | Status: DC

## 2019-10-12 NOTE — Assessment & Plan Note (Signed)
On Levothyroxine, continue to monitor 

## 2019-10-12 NOTE — Assessment & Plan Note (Signed)
Tolerating statin, encouraged heart healthy diet, avoid trans fats, minimize simple carbs and saturated fats. Increase exercise as tolerated 

## 2019-10-12 NOTE — Patient Instructions (Addendum)
[facility] Blood Pressure cuff, upper arm, want BP 100-140/60-90 Pulse oximeter, want oxygen in 90s  Weekly vitals  Take Multivitamin with minerals, selenium Vitamin D 1000-2000 IU daily Probiotic with lactobacillus and bifidophilus ECASA 81 mg daily Fish or krill oil caps daily  Melatonin 2-5 mg at bedtime  [email or web address] [email or web address]  [place] buy through [email or web address]  Benefiber powder in beverage once to twice daily Preventive Care 65 Years and Older, Male Preventive care refers to lifestyle choices and visits with your health care provider that can promote health and wellness. This includes:  A yearly physical exam. This is also called an annual well check.  Regular dental and eye exams.  Immunizations.  Screening for certain conditions.  Healthy lifestyle choices, such as diet and exercise. What can I expect for my preventive care visit? Physical exam Your health care provider will check:  Height and weight. These may be used to calculate body mass index (BMI), which is a measurement that tells if you are at a healthy weight.  Heart rate and blood pressure.  Your skin for abnormal spots. Counseling Your health care provider may ask you questions about:  Alcohol, tobacco, and drug use.  Emotional well-being.  Home and relationship well-being.  Sexual activity.  Eating habits.  History of falls.  Memory and ability to understand (cognition).  Work and work Statistician. What immunizations do I need?  Influenza (flu) vaccine  This is recommended every year. Tetanus, diphtheria, and pertussis (Tdap) vaccine  You may need a Td booster every 10 years. Varicella (chickenpox) vaccine  You may need this vaccine if you have not already been vaccinated. Zoster (shingles) vaccine  You may need this after age 23. Pneumococcal conjugate (PCV13) vaccine  One dose is recommended after age 83. Pneumococcal polysaccharide  (PPSV23) vaccine  One dose is recommended after age 10. Measles, mumps, and rubella (MMR) vaccine  You may need at least one dose of MMR if you were born in 1957 or later. You may also need a second dose. Meningococcal conjugate (MenACWY) vaccine  You may need this if you have certain conditions. Hepatitis A vaccine  You may need this if you have certain conditions or if you travel or work in places where you may be exposed to hepatitis A. Hepatitis B vaccine  You may need this if you have certain conditions or if you travel or work in places where you may be exposed to hepatitis B. Haemophilus influenzae type b (Hib) vaccine  You may need this if you have certain conditions. You may receive vaccines as individual doses or as more than one vaccine together in one shot (combination vaccines). Talk with your health care provider about the risks and benefits of combination vaccines. What tests do I need? Blood tests  Lipid and cholesterol levels. These may be checked every 5 years, or more frequently depending on your overall health.  Hepatitis C test.  Hepatitis B test. Screening  Lung cancer screening. You may have this screening every year starting at age 16 if you have a 30-pack-year history of smoking and currently smoke or have quit within the past 15 years.  Colorectal cancer screening. All adults should have this screening starting at age 81 and continuing until age 43. Your health care provider may recommend screening at age 87 if you are at increased risk. You will have tests every 1-10 years, depending on your results and the type of screening test.  Prostate cancer screening. Recommendations will vary depending on your  family history and other risks.  Diabetes screening. This is done by checking your blood sugar (glucose) after you have not eaten for a while (fasting). You may have this done every 1-3 years.  Abdominal aortic aneurysm (AAA) screening. You may need this  if you are a current or former smoker.  Sexually transmitted disease (STD) testing. Follow these instructions at home: Eating and drinking  Eat a diet that includes fresh fruits and vegetables, whole grains, lean protein, and low-fat dairy products. Limit your intake of foods with high amounts of sugar, saturated fats, and salt.  Take vitamin and mineral supplements as recommended by your health care provider.  Do not drink alcohol if your health care provider tells you not to drink.  If you drink alcohol: ? Limit how much you have to 0-2 drinks a day. ? Be aware of how much alcohol is in your drink. In the [place] one drink equals one 12 oz bottle of beer (355 mL), one 5 oz glass of wine (148 mL), or one 1 oz glass of hard liquor (44 mL). Lifestyle  Take daily care of your teeth and gums.  Stay active. Exercise for at least 30 minutes on 5 or more days each week.  Do not use any products that contain nicotine or tobacco, such as cigarettes, e-cigarettes, and chewing tobacco. If you need help quitting, ask your health care provider.  If you are sexually active, practice safe sex. Use a condom or other form of protection to prevent STIs (sexually transmitted infections).  Talk with your health care provider about taking a low-dose aspirin or statin. What's next?  Visit your health care provider once a year for a well check visit.  Ask your health care provider how often you should have your eyes and teeth checked.  Stay up to date on all vaccines. This information is not intended to replace advice given to you by your health care provider. Make sure you discuss any questions you have with your health care provider. Document Revised: 06/30/2018 Document Reviewed: 06/30/2018 Elsevier Patient Education  2020 [employer].

## 2019-10-12 NOTE — Assessment & Plan Note (Signed)
Well controlled, no changes to meds. Encouraged heart healthy diet such as the DASH diet and exercise as tolerated.  °

## 2019-10-12 NOTE — Assessment & Plan Note (Signed)
Rate controlled. Following with cardiology.  

## 2019-10-12 NOTE — Assessment & Plan Note (Addendum)
Patient encouraged to maintain heart healthy diet, regular exercise, adequate sleep. Consider daily probiotics. Take medications as prescribed. Labs reviewed 

## 2019-10-12 NOTE — Progress Notes (Signed)
Patient ID: Tanner [name], male   DOB: 1935-05-16, 85 y.o.   MRN: [id number]   Subjective:    Patient ID: Tanner [name], male    DOB: 07/12/1935, 85 y.o.   MRN: [id number]  Chief Complaint  Patient presents with  . Annual Exam    HPI Patient is in today for Preventative annual exam. He is doing well. He has had both of his Moderna shots back in September in a clinical trial. He had a low grade fever and bodyaches for about 24 hours then he was fine. No recent febrile illness or hospitalizations. He is staying active and eating well. Denies CP/palp/SOB/HA/congestion/fevers/GI or GU c/o. Taking meds as prescribed  Past Medical History:  Diagnosis Date  . Allergy    seasonal- mostly spring  . Anemia 02/03/2011  . ASTHMA, CHILDHOOD 03/17/2010   mostly as child  . BCC (basal cell carcinoma) 12/08/2011  . CAROTID ARTERY OCCLUSION, WITH INFARCTION 03/17/2010  . CEREBROVASCULAR ACCIDENT 03/17/2010   partial loss of peripheral vision on left-"slight improved"  . CHICKENPOX, HX OF 03/17/2010  . Constipation 12/08/2014  . Epistaxis 09/06/2013  . FATIGUE 03/17/2010  . Hearing loss 11/04/2010   bilateral  . HYPERKALEMIA 05/27/2010  . Hyperlipidemia   . Hypertension   . Hypothyroid 12/08/2011  . Mixed hyperlipidemia 03/17/2010  . Oral lesion 11/20/2014  . Overweight(278.02) 07/01/2010  . Peripheral neuropathy 08/18/2016  . Personal history of other infectious and parasitic disease 03/17/2010  . Preventative health care 08/23/2016  . Skin lesion of right arm 11/20/2014  . Sleep apnea    uses mouthpiece only  . Thrombocytopenia ([place]) 04/05/2012  . TOBACCO ABUSE, HX OF 03/17/2010  . Tubular adenoma of colon 06/10/2011    Past Surgical History:  Procedure Laterality Date  . CARDIOVERSION N/A 08/11/2019   Procedure: CARDIOVERSION;  Surgeon: Josue Hector, MD;  Location: [facility] ENDOSCOPY;  Service: Cardiovascular;  Laterality: N/A;  . CAROTID ENDARTERECTOMY Left February 13, 2013   cea  . CATARACT  EXTRACTION, BILATERAL Right    Cataract, and stigmatism  . COLONOSCOPY  Dec. 2014  . ENDARTERECTOMY Left 02/13/2013   Procedure: ENDARTERECTOMY CAROTID;  Surgeon: Rosetta Posner, MD;  Location: [facility];  Service: Vascular;  Laterality: Left;  . EUS N/A 08/17/2013   Procedure: UPPER ENDOSCOPIC ULTRASOUND (EUS) LINEAR;  Surgeon: Milus Banister, MD;  Location: [facility] ENDOSCOPY;  Service: Endoscopy;  Laterality: N/A;  . knee cartliage repair  1964, 1990   bilateral  . [place]   removed  . ROTATOR CUFF REPAIR     right  . TONSILLECTOMY    . UPPER GI ENDOSCOPY  Jan. 2015    Family History  Problem Relation Age of Onset  . Alzheimer's disease Mother   . Emphysema Sister        cigarettes  . Cancer Sister        lung, tobacco   . Heart disease Son   . Coronary artery disease Brother   . Heart attack Brother   . Other Brother        heart problems- from fathers side  . Cancer Brother 86       lung? smoker  . Heart disease Brother 26       Heart disease before age 62  . Cancer Sister        gyn  . Heart disease Sister   . Bipolar disorder Daughter   . Obesity Daughter   . Stroke Father   .  Heart disease Sister     Social History   Socioeconomic History  . Marital status: Divorced    Spouse name: Not on file  . Number of children: 3  . Years of education: 72yrs  . Highest education level: Bachelor's degree (e.g., BA, AB, BS)  Occupational History  . Occupation: retired    Fish farm manager: RETIRED  Tobacco Use  . Smoking status: Former Smoker    Packs/day: 2.00    Years: 20.00    Pack years: 40.00    Types: Cigarettes    Quit date: 07/19/1977    Years since quitting: 42.2  . Smokeless tobacco: Never Used  Substance and Sexual Activity  . Alcohol use: Yes    Alcohol/week: 7.0 standard drinks    Types: 7 Glasses of wine per week    Comment: dinner wine daily  . Drug use: No  . Sexual activity: Never  Other Topics Concern  . Not on file  Social History  Narrative   Pt lives at home alone.    Caffeine Use: very little   Heart healthy diet, is planning exercise      Retired from Event organiser   Left handed   Social Determinants of Health   Financial Resource Strain:   . Difficulty of Paying Living Expenses:   Food Insecurity:   . Worried About Charity fundraiser in the Last Year:   . Arboriculturist in the Last Year:   Transportation Needs:   . Film/video editor (Medical):   [name] Lack of Transportation (Non-Medical):   Physical Activity:   . Days of Exercise per Week:   . Minutes of Exercise per Session:   Stress:   . Feeling of Stress :   Social Connections:   . Frequency of Communication with Friends and Family:   . Frequency of Social Gatherings with Friends and Family:   . Attends Religious Services:   . Active Member of Clubs or Organizations:   . Attends Archivist Meetings:   [name] Marital Status:   Intimate Partner Violence:   . Fear of Current or Ex-Partner:   . Emotionally Abused:   [name] Physically Abused:   . Sexually Abused:     Outpatient Medications Prior to Visit  Medication Sig Dispense Refill  . acetaminophen (TYLENOL) 500 MG tablet Take 1,000 mg by mouth daily as needed for moderate pain or headache.    [name] aspirin EC 81 MG tablet Take 81 mg by mouth every evening.     . chlorthalidone (HYGROTON) 25 MG tablet TAKE 1 TABLET BY MOUTH IN  THE MORNING (Patient taking differently: Take 25 mg by mouth daily. ) 90 tablet 3  . levothyroxine (SYNTHROID) 88 MCG tablet TAKE 1 TABLET BY MOUTH  DAILY BEFORE BREAKFAST (Patient taking differently: Take 88 mcg by mouth daily before breakfast. ) 90 tablet 3  . LINZESS 145 MCG CAPS capsule TAKE 1 CAPSULE BY MOUTH  DAILY BEFORE BREAKFAST (Patient taking differently: Take 145 mcg by mouth daily before breakfast. ) 90 capsule 0  . metoprolol succinate (TOPROL XL) 25 MG 24 hr tablet Take 0.5 tablets (12.5 mg total) by mouth daily. 90 tablet 3  . pantoprazole (PROTONIX)  40 MG tablet TAKE 1 TABLET BY MOUTH  DAILY (Patient taking differently: Take 40 mg by mouth every evening. ) 90 tablet 3  . ramipril (ALTACE) 10 MG capsule TAKE 1 CAPSULE BY MOUTH  TWICE DAILY (Patient taking differently: Take 10 mg by mouth 2 (two)  times daily. ) 180 capsule 3  . rivaroxaban (XARELTO) 20 MG TABS tablet Take 1 tablet (20 mg total) by mouth daily with supper. 90 tablet 3  . rosuvastatin (CRESTOR) 40 MG tablet TAKE 1 TABLET BY MOUTH IN  THE EVENING (Patient taking differently: Take 40 mg by mouth every evening. ) 90 tablet 3  . sodium chloride (OCEAN) 0.65 % SOLN nasal spray Place 1 spray into both nostrils as needed for congestion.     No facility-administered medications prior to visit.    No Known Allergies  Review of Systems  Constitutional: Negative for chills, fever and malaise/fatigue.  HENT: Negative for congestion and hearing loss.   Eyes: Negative for discharge.  Respiratory: Negative for cough, sputum production and shortness of breath.   Cardiovascular: Negative for chest pain, palpitations and leg swelling.  Gastrointestinal: Negative for abdominal pain, blood in stool, constipation, diarrhea, heartburn, nausea and vomiting.  Genitourinary: Negative for dysuria, frequency, hematuria and urgency.  Musculoskeletal: Negative for back pain, falls and myalgias.  Skin: Negative for rash.  Neurological: Negative for dizziness, sensory change, loss of consciousness, weakness and headaches.  Endo/Heme/Allergies: Negative for environmental allergies. Does not bruise/bleed easily.  Psychiatric/Behavioral: Negative for depression and suicidal ideas. The patient is not nervous/anxious and does not have insomnia.        Objective:    Physical Exam Vitals and nursing note reviewed.  Constitutional:      General: He is not in acute distress.    Appearance: He is well-developed.  HENT:     Head: Normocephalic and atraumatic.     Right Ear: Tympanic membrane, ear canal  and external ear normal.     Left Ear: Tympanic membrane, ear canal and external ear normal.     Nose: Nose normal. No congestion.  Eyes:     General:        Right eye: No discharge.        Left eye: No discharge.     Conjunctiva/sclera: Conjunctivae normal.     Pupils: Pupils are equal, round, and reactive to light.  Cardiovascular:     Rate and Rhythm: Normal rate. Rhythm irregular.     Heart sounds: No murmur.  Pulmonary:     Effort: Pulmonary effort is normal.     Breath sounds: Normal breath sounds.  Abdominal:     General: Bowel sounds are normal.     Palpations: Abdomen is soft.     Tenderness: There is no abdominal tenderness.  Musculoskeletal:     Cervical back: Normal range of motion and neck supple.  Skin:    General: Skin is warm and dry.  Neurological:     Mental Status: He is alert and oriented to person, place, and time.  Psychiatric:        Behavior: Behavior normal.     BP 133/71 (BP Location: Left Arm, Patient Position: Sitting, Cuff Size: Small)   Pulse (!) 55   Temp (!) 97.3 F (36.3 C) (Temporal)   Resp 16   Ht 6\' 2"  (1.88 m)   Wt 212 lb (96.2 kg)   SpO2 100%   BMI 27.22 kg/m  Wt Readings from Last 3 Encounters:  10/12/19 212 lb (96.2 kg)  08/18/19 210 lb 3.2 oz (95.3 kg)  08/15/19 208 lb 12.8 oz (94.7 kg)    Diabetic Foot Exam - Simple   No data filed     Lab Results  Component Value Date   WBC 5.7 10/06/2019   HGB  14.1 10/06/2019   HCT 42.3 10/06/2019   PLT 144.0 (L) 10/06/2019   GLUCOSE 89 10/05/2019   CHOL 164 10/05/2019   TRIG 100.0 10/05/2019   HDL 52.10 10/05/2019   LDLCALC 92 10/05/2019   ALT 17 10/05/2019   AST 19 10/05/2019   NA 139 10/05/2019   K 4.2 10/05/2019   CL 101 10/05/2019   CREATININE 1.05 10/05/2019   BUN 19 10/05/2019   CO2 33 (H) 10/05/2019   TSH 4.58 (H) 10/05/2019   PSA 0.39 05/08/2014   INR 0.99 02/07/2013   HGBA1C 5.7 10/05/2019   MICROALBUR 2.0 (H) 12/03/2015    Lab Results  Component Value  Date   TSH 4.58 (H) 10/05/2019   Lab Results  Component Value Date   WBC 5.7 10/06/2019   HGB 14.1 10/06/2019   HCT 42.3 10/06/2019   MCV 95.4 10/06/2019   PLT 144.0 (L) 10/06/2019   Lab Results  Component Value Date   NA 139 10/05/2019   K 4.2 10/05/2019   CO2 33 (H) 10/05/2019   GLUCOSE 89 10/05/2019   BUN 19 10/05/2019   CREATININE 1.05 10/05/2019   BILITOT 1.0 10/05/2019   ALKPHOS 66 10/05/2019   AST 19 10/05/2019   ALT 17 10/05/2019   PROT 7.1 10/05/2019   ALBUMIN 4.5 10/05/2019   CALCIUM 9.4 10/05/2019   ANIONGAP 11 08/02/2019   GFR 67.50 10/05/2019   Lab Results  Component Value Date   CHOL 164 10/05/2019   Lab Results  Component Value Date   HDL 52.10 10/05/2019   Lab Results  Component Value Date   LDLCALC 92 10/05/2019   Lab Results  Component Value Date   TRIG 100.0 10/05/2019   Lab Results  Component Value Date   CHOLHDL 3 10/05/2019   Lab Results  Component Value Date   HGBA1C 5.7 10/05/2019       Assessment & Plan:   Problem List Items Addressed This Visit    Hypothyroid (Chronic)    On Levothyroxine, continue to monitor      Mixed hyperlipidemia    Tolerating statin, encouraged heart healthy diet, avoid trans fats, minimize simple carbs and saturated fats. Increase exercise as tolerated      Essential hypertension, benign    Well controlled, no changes to meds. Encouraged heart healthy diet such as the DASH diet and exercise as tolerated.       Preventative health care    Patient encouraged to maintain heart healthy diet, regular exercise, adequate sleep. Consider daily probiotics. Take medications as prescribed. Labs reviewed      Hyperglycemia    hgba1c acceptable, minimize simple carbs. Increase exercise as tolerated.      Persistent atrial fibrillation (HCC)    Rate controlled. Following with cardiology         I am having [place] "Phil" maintain his aspirin EC, levothyroxine, ramipril, pantoprazole,  rosuvastatin, chlorthalidone, Linzess, rivaroxaban, acetaminophen, sodium chloride, and metoprolol succinate.  No orders of the defined types were placed in this encounter.    Penni Homans, MD

## 2019-10-12 NOTE — Assessment & Plan Note (Signed)
hgba1c acceptable, minimize simple carbs. Increase exercise as tolerated.  

## 2019-11-02 ENCOUNTER — Telehealth: Payer: Self-pay

## 2019-11-02 NOTE — Telephone Encounter (Signed)
Received paperwork from [facility] balance study and placed in provider's folder for signature and clearing of patient.

## 2019-11-03 NOTE — Telephone Encounter (Signed)
I have placed last note along with it in your yellow folder.

## 2019-11-03 NOTE — Telephone Encounter (Signed)
Will sign in office

## 2019-11-07 NOTE — Telephone Encounter (Signed)
Paperwork placed at front for pick up.

## 2019-11-13 NOTE — Progress Notes (Signed)
Primary Care Physician: [name], MD Primary Cardiologist: Dr [name] Cancel Primary Electrophysiologist: none Referring Physician: Dr Shirlee More is a 85 y.o. male with a history of HTN, tobacco abuse quit 2011, CVA 2011, HLD, hypothyroidism, OSA, and new onset persistent atrial fibrillation who presents for follow up in the [facility].  The patient was initially diagnosed with atrial fibrillation 05/2019 at his PCP office after noting irregular readings on his FitBit. He is otherwise asymptomatic and rate controlled. Patient is on Xarelto for a CHADS2VASC score of 6. Patient is s/p DCCV on 08/11/19.   On follow up today, patient reports he has done well since his last visit. He denies any irregular pulse since his DCCV. He denies any bleeding issues with anticoagulation. He is currently enrolled in a study at [facility] for balance improvement.   Today, he denies symptoms of palpitations, chest pain, shortness of breath, orthopnea, PND, lower extremity edema, dizziness, presyncope, syncope, snoring, daytime somnolence, bleeding, or neurologic sequela. The patient is tolerating medications without difficulties and is otherwise without complaint today.    Atrial Fibrillation Risk Factors:  he does have symptoms or diagnosis of sleep apnea. he is compliant with oral device therapy. he does not have a history of rheumatic fever. he does have a history of alcohol use. The patient does not have a history of early familial atrial fibrillation or other arrhythmias.  he has a BMI of Body mass index is 27.6 kg/m.[name] Filed Weights   11/14/19 0838  Weight: 97.5 kg    Family History  Problem Relation Age of Onset  . Alzheimer's disease Mother   . Emphysema Sister        cigarettes  . Cancer Sister        lung, tobacco   . Heart disease Son   . Coronary artery disease Brother   . Heart attack Brother   . Other Brother        heart problems- from  fathers side  . Cancer Brother 26       lung? smoker  . Heart disease Brother 66       Heart disease before age 52  . Cancer Sister        gyn  . Heart disease Sister   . Bipolar disorder Daughter   . Obesity Daughter   . Stroke Father   . Heart disease Sister      Atrial Fibrillation Management history:  Previous antiarrhythmic drugs: none Previous cardioversions: 08/11/19 Previous ablations: none CHADS2VASC score: 6 Anticoagulation history: Xarelto   Past Medical History:  Diagnosis Date  . Allergy    seasonal- mostly spring  . Anemia 02/03/2011  . ASTHMA, CHILDHOOD 03/17/2010   mostly as child  . BCC (basal cell carcinoma) 12/08/2011  . CAROTID ARTERY OCCLUSION, WITH INFARCTION 03/17/2010  . CEREBROVASCULAR ACCIDENT 03/17/2010   partial loss of peripheral vision on left-"slight improved"  . CHICKENPOX, HX OF 03/17/2010  . Constipation 12/08/2014  . Epistaxis 09/06/2013  . FATIGUE 03/17/2010  . Hearing loss 11/04/2010   bilateral  . HYPERKALEMIA 05/27/2010  . Hyperlipidemia   . Hypertension   . Hypothyroid 12/08/2011  . Mixed hyperlipidemia 03/17/2010  . Oral lesion 11/20/2014  . Overweight(278.02) 07/01/2010  . Peripheral neuropathy 08/18/2016  . Personal history of other infectious and parasitic disease 03/17/2010  . Preventative health care 08/23/2016  . Skin lesion of right arm 11/20/2014  . Sleep apnea    uses mouthpiece only  .  Thrombocytopenia ([place]) 04/05/2012  . TOBACCO ABUSE, HX OF 03/17/2010  . Tubular adenoma of colon 06/10/2011   Past Surgical History:  Procedure Laterality Date  . CARDIOVERSION N/A 08/11/2019   Procedure: CARDIOVERSION;  Surgeon: Josue Hector, MD;  Location[facility] ENDOSCOPY;  Service: Cardiovascular;  Laterality: N/A;  . CAROTID ENDARTERECTOMY Left February 13, 2013   cea  . CATARACT EXTRACTION, BILATERAL Right    Cataract, and stigmatism  . COLONOSCOPY  Dec. 2014  . ENDARTERECTOMY Left 02/13/2013   Procedure: ENDARTERECTOMY CAROTID;  Surgeon:  Rosetta Posner, MD;  Location: [facility];  Service: Vascular;  Laterality: Left;  . EUS N/A 08/17/2013   Procedure: UPPER ENDOSCOPIC ULTRASOUND (EUS) LINEAR;  Surgeon: Milus Banister, MD;  Location: [facility] ENDOSCOPY;  Service: Endoscopy;  Laterality: N/A;  . knee cartliage repair  1964, 1990   bilateral  . [place]   removed  . ROTATOR CUFF REPAIR     right  . TONSILLECTOMY    . UPPER GI ENDOSCOPY  Jan. 2015    Current Outpatient Medications  Medication Sig Dispense Refill  . acetaminophen (TYLENOL) 500 MG tablet Take 1,000 mg by mouth daily as needed for moderate pain or headache.    [name] aspirin EC 81 MG tablet Take 81 mg by mouth every evening.     . chlorthalidone (HYGROTON) 25 MG tablet TAKE 1 TABLET BY MOUTH IN  THE MORNING 90 tablet 3  . levothyroxine (SYNTHROID) 88 MCG tablet TAKE 1 TABLET BY MOUTH  DAILY BEFORE BREAKFAST 90 tablet 3  . linaclotide (LINZESS) 145 MCG CAPS capsule TAKE 1 CAPSULE BY MOUTH  DAILY BEFORE BREAKFAST 90 capsule 3  . metoprolol succinate (TOPROL XL) 25 MG 24 hr tablet Take 0.5 tablets (12.5 mg total) by mouth daily. 45 tablet 3  . pantoprazole (PROTONIX) 40 MG tablet TAKE 1 TABLET BY MOUTH  DAILY 90 tablet 3  . ramipril (ALTACE) 10 MG capsule TAKE 1 CAPSULE BY MOUTH  TWICE DAILY 180 capsule 3  . rivaroxaban (XARELTO) 20 MG TABS tablet Take 1 tablet (20 mg total) by mouth daily with supper. 90 tablet 3  . rosuvastatin (CRESTOR) 40 MG tablet TAKE 1 TABLET BY MOUTH IN  THE EVENING 90 tablet 3  . sodium chloride (OCEAN) 0.65 % SOLN nasal spray Place 1 spray into both nostrils as needed for congestion.     No current facility-administered medications for this encounter.    No Known Allergies  Social History   Socioeconomic History  . Marital status: Divorced    Spouse name: Not on file  . Number of children: 3  . Years of education: 73yrs  . Highest education level: Bachelor's degree (e.g., BA, AB, BS)  Occupational History  . Occupation:  retired    Fish farm manager: RETIRED  Tobacco Use  . Smoking status: Former Smoker    Packs/day: 2.00    Years: 20.00    Pack years: 40.00    Types: Cigarettes    Quit date: 07/19/1977    Years since quitting: 42.3  . Smokeless tobacco: Never Used  Substance and Sexual Activity  . Alcohol use: Yes    Alcohol/week: 14.0 standard drinks    Types: 7 Glasses of wine, 7 Cans of beer per week    Comment: dinner wine daily  . Drug use: No  . Sexual activity: Never  Other Topics Concern  . Not on file  Social History Narrative   Pt lives at home alone.  Caffeine Use: very little   Heart healthy diet, is planning exercise      Retired from Event organiser   Left handed   Social Determinants of Health   Financial Resource Strain:   . Difficulty of Paying Living Expenses:   Food Insecurity:   . Worried About Charity fundraiser in the Last Year:   . Arboriculturist in the Last Year:   Transportation Needs:   . Film/video editor (Medical):   [name] Lack of Transportation (Non-Medical):   Physical Activity:   . Days of Exercise per Week:   . Minutes of Exercise per Session:   Stress:   . Feeling of Stress :   Social Connections:   . Frequency of Communication with Friends and Family:   . Frequency of Social Gatherings with Friends and Family:   . Attends Religious Services:   . Active Member of Clubs or Organizations:   . Attends Archivist Meetings:   [name] Marital Status:   Intimate Partner Violence:   . Fear of Current or Ex-Partner:   . Emotionally Abused:   [name] Physically Abused:   . Sexually Abused:      ROS- All systems are reviewed and negative except as per the HPI above.  Physical Exam: Vitals:   11/14/19 0838  BP: (!) 142/80  Pulse: (!) 54  Weight: 97.5 kg  Height: 6\' 2"  (1.88 m)    GEN- The patient is well appearing elderly male, alert and oriented x 3 today.   HEENT-head normocephalic, atraumatic, sclera clear, conjunctiva pink, hearing intact,  trachea midline. Lungs- Clear to ausculation bilaterally, normal work of breathing Heart- Regular rate and rhythm, no murmurs, rubs or gallops  GI- soft, NT, ND, + BS Extremities- no clubbing, cyanosis, or edema MS- no significant deformity or atrophy Skin- no rash or lesion Psych- euthymic mood, full affect Neuro- strength and sensation are intact   Wt Readings from Last 3 Encounters:  11/14/19 97.5 kg  10/12/19 96.2 kg  08/18/19 95.3 kg    EKG today demonstrates SB HR 54, 1st degree AV block, PR 206, QRS 96, QTc 398  Echo 07/19/19 demonstrated   1. Left ventricular ejection fraction, by visual estimation, is 60 to 65%. The left ventricle has normal function. There is no left ventricular hypertrophy.  2. Left ventricular diastolic parameters are consistent with Grade II diastolic dysfunction (pseudonormalization).  3. The left ventricle has no regional wall motion abnormalities.  4. Global right ventricle has normal systolic function.The right ventricular size is normal. No increase in right ventricular wall thickness.  5. Left atrial size was moderately dilated.  6. Right atrial size was mildly dilated.  7. The mitral valve is normal in structure. Mild mitral valve regurgitation. No evidence of mitral stenosis.  8. The tricuspid valve is normal in structure.  9. Aortic valve mean gradient measures 8.0 mmHg. 10. The aortic valve is normal in structure. Aortic valve regurgitation is mild. Mild aortic valve stenosis. 11. Pulmonic regurgitation is mild. 12. The pulmonic valve was normal in structure. Pulmonic valve regurgitation is mild. 13. Normal pulmonary artery systolic pressure. 14. The tricuspid regurgitant velocity is 2.30 m/s, and with an assumed right atrial pressure of 3 mmHg, the estimated right ventricular systolic pressure is normal at 24.2 mmHg. 15. The inferior vena cava is normal in size with greater than 50% respiratory variability, suggesting right atrial pressure of  3 mmHg.  Epic records are reviewed at length today  CHA2DS2-VASc  Score = 6 The patient's score is based upon: CHF History: No HTN History: Yes Age : 74 + Diabetes History: No Stroke History: Yes Vascular Disease History: Yes Gender: Male     ASSESSMENT AND PLAN: 1. Persistent Atrial Fibrillation (ICD10:  I48.19) The patient's CHA2DS2-VASc score is 6, indicating a 9.7% annual risk of stroke.   S/p DCCV 08/11/19 Patient appears to be maintaining SR. Continue Toprol 12.5 mg daily Continue Xarelto 20 mg daily.  Continue ASA per Dr [name] Cancel for carotid disease.   2. Secondary Hypercoagulable State (ICD10:  D68.69) The patient is at significant risk for stroke/thromboembolism based upon his CHA2DS2-VASc Score of 6.  Continue Rivaroxaban (Xarelto).   3. Obstructive sleep apnea The importance of adequate treatment of sleep apnea was discussed today in order to improve our ability to maintain sinus rhythm long term. Followed by Dr Ander Slade.  4. HTN Stable, no changes today.   Follow up in the AF clinic in 6 months.    [facility] [address] [place], [place] [address] [phone] 11/14/2019 8:59 AM

## 2019-11-14 ENCOUNTER — Encounter (HOSPITAL_COMMUNITY): Payer: Self-pay | Admitting: Physician Assistant

## 2019-11-14 ENCOUNTER — Other Ambulatory Visit: Payer: Self-pay

## 2019-11-14 ENCOUNTER — Ambulatory Visit (HOSPITAL_COMMUNITY)
Admission: RE | Admit: 2019-11-14 | Discharge: 2019-11-14 | Disposition: A | Discharge status: Home / Self Care | Payer: Medicare Other | Source: Ambulatory Visit | Attending: Physician Assistant | Admitting: Physician Assistant

## 2019-11-14 VITALS — BP 142/80 | HR 54 | Ht 74.0 in | Wt 215.0 lb

## 2019-11-14 DIAGNOSIS — G4733 Obstructive sleep apnea (adult) (pediatric): Secondary | ICD-10-CM | POA: Diagnosis not present

## 2019-11-14 DIAGNOSIS — D6869 Other thrombophilia: Secondary | ICD-10-CM | POA: Diagnosis not present

## 2019-11-14 DIAGNOSIS — I4819 Other persistent atrial fibrillation: Secondary | ICD-10-CM | POA: Diagnosis present

## 2019-11-14 DIAGNOSIS — Z7982 Long term (current) use of aspirin: Secondary | ICD-10-CM | POA: Insufficient documentation

## 2019-11-14 DIAGNOSIS — E782 Mixed hyperlipidemia: Secondary | ICD-10-CM | POA: Diagnosis not present

## 2019-11-14 DIAGNOSIS — Z7901 Long term (current) use of anticoagulants: Secondary | ICD-10-CM | POA: Insufficient documentation

## 2019-11-14 DIAGNOSIS — Z87891 Personal history of nicotine dependence: Secondary | ICD-10-CM | POA: Insufficient documentation

## 2019-11-14 DIAGNOSIS — Z79899 Other long term (current) drug therapy: Secondary | ICD-10-CM | POA: Diagnosis not present

## 2019-11-14 DIAGNOSIS — I10 Essential (primary) hypertension: Secondary | ICD-10-CM | POA: Diagnosis not present

## 2019-11-14 DIAGNOSIS — D696 Thrombocytopenia, unspecified: Secondary | ICD-10-CM | POA: Insufficient documentation

## 2019-12-19 ENCOUNTER — Other Ambulatory Visit: Payer: Self-pay | Admitting: Family Medicine

## 2020-01-08 ENCOUNTER — Other Ambulatory Visit: Payer: Self-pay

## 2020-01-08 ENCOUNTER — Ambulatory Visit (INDEPENDENT_AMBULATORY_CARE_PROVIDER_SITE_OTHER): Payer: Medicare Other | Admitting: Family Medicine

## 2020-01-08 DIAGNOSIS — I10 Essential (primary) hypertension: Secondary | ICD-10-CM | POA: Diagnosis not present

## 2020-01-08 DIAGNOSIS — E782 Mixed hyperlipidemia: Secondary | ICD-10-CM

## 2020-01-08 DIAGNOSIS — R739 Hyperglycemia, unspecified: Secondary | ICD-10-CM | POA: Diagnosis not present

## 2020-01-08 LAB — COMPREHENSIVE METABOLIC PANEL
ALT: 20 U/L (ref 0–53)
AST: 18 U/L (ref 0–37)
Albumin: 4.6 g/dL (ref 3.5–5.2)
Alkaline Phosphatase: 61 U/L (ref 39–117)
BUN: 22 mg/dL (ref 6–23)
CO2: 32 mEq/L (ref 19–32)
Calcium: 9.7 mg/dL (ref 8.4–10.5)
Chloride: 102 mEq/L (ref 96–112)
Creatinine, Ser: 1.06 mg/dL (ref 0.40–1.50)
GFR: 66.73 mL/min (ref >60.00)
Glucose, Bld: 95 mg/dL (ref 70–99)
Potassium: 4.4 mEq/L (ref 3.5–5.1)
Sodium: 140 mEq/L (ref 135–145)
Total Bilirubin: 0.8 mg/dL (ref 0.2–1.2)
Total Protein: 6.9 g/dL (ref 6.0–8.3)

## 2020-01-08 LAB — CBC
HCT: 40.4 % (ref 39.0–52.0)
Hemoglobin: 13.5 g/dL (ref 13.0–17.0)
MCHC: 33.4 g/dL (ref 30.0–36.0)
MCV: 96.6 fl (ref 78.0–100.0)
Platelets: 139.7 10*3/uL — ABNORMAL LOW (ref 150.0–400.0)
RBC: 4.18 Mil/uL — ABNORMAL LOW (ref 4.22–5.81)
RDW: 13.7 % (ref 11.5–15.5)
WBC: 5.7 10*3/uL (ref 4.0–10.5)

## 2020-01-08 LAB — LIPID PANEL
Cholesterol: 160 mg/dL (ref 0–200)
HDL: 54.1 mg/dL (ref >39.00)
LDL Cholesterol: 91 mg/dL (ref 0–99)
NonHDL: 106.26
Total CHOL/HDL Ratio: 3
Triglycerides: 77 mg/dL (ref 0.0–149.0)
VLDL: 15.4 mg/dL (ref 0.0–40.0)

## 2020-01-08 LAB — HEMOGLOBIN A1C: Hgb A1c MFr Bld: 5.7 % (ref 4.6–6.5)

## 2020-01-08 LAB — TSH: TSH: 2.12 u[IU]/mL (ref 0.35–4.50)

## 2020-02-12 ENCOUNTER — Ambulatory Visit: Payer: Medicare Other | Admitting: Pulmonary Disease

## 2020-02-12 ENCOUNTER — Encounter: Payer: Self-pay | Admitting: Pulmonary Disease

## 2020-02-12 ENCOUNTER — Encounter: Payer: Self-pay | Admitting: Family Medicine

## 2020-02-12 ENCOUNTER — Other Ambulatory Visit: Payer: Self-pay

## 2020-02-12 VITALS — BP 160/68 | HR 60 | Temp 97.8°F | Ht 74.0 in | Wt 215.2 lb

## 2020-02-12 DIAGNOSIS — G4733 Obstructive sleep apnea (adult) (pediatric): Secondary | ICD-10-CM

## 2020-02-12 DIAGNOSIS — Z9989 Dependence on other enabling machines and devices: Secondary | ICD-10-CM | POA: Diagnosis not present

## 2020-02-12 NOTE — Progress Notes (Signed)
Tanner [name]    [id number]    08-05-1934  Primary Care Physician:Blyth, Bonnita Levan, MD  Referring Physician: [name], MD [address] [place],  [place] [address]  Chief complaint:   Patient with a history of obstructive sleep apnea diagnosed with mild obstructive sleep apnea in 2014 Follow-up for obstructive sleep apnea  HPI: Has been using CPAP on a regular basis Compliant with treatment Generally feels better No chest pains or chest discomfort Wakes up feeling like he is at a gross nights rest Blood pressure is a little up Remains compliant with CPAP therapy and feels well  Historically He has been using an oral device since 2014 Device was upgraded about 4 years after initial start date He has had multiple evaluations recently with concerns that the oral device was not working to treat his sleep disordered breathing He stated he has had at least 4 studies with the oral device in place Dr. Ron Parker did get concerned about the device not been very efficient in treating his sleep disordered breathing  He has a history of CVAs, coronary artery disease  Usually goes to bed between 10 and 11:30 PM, takes him about 20 minutes to fall asleep Wakes up about 2-4 times during the night Final wake up time between 7 and 8 AM  He is concerned he may not be able to tolerate CPAP therapy   Outpatient Encounter Medications as of 02/12/2020  Medication Sig  . acetaminophen (TYLENOL) 500 MG tablet Take 1,000 mg by mouth daily as needed for moderate pain or headache.  [name] aspirin EC 81 MG tablet Take 81 mg by mouth every evening.   . chlorthalidone (HYGROTON) 25 MG tablet TAKE 1 TABLET BY MOUTH IN  THE MORNING  . levothyroxine (SYNTHROID) 88 MCG tablet TAKE 1 TABLET BY MOUTH  DAILY BEFORE BREAKFAST  . linaclotide (LINZESS) 145 MCG CAPS capsule TAKE 1 CAPSULE BY MOUTH  DAILY BEFORE BREAKFAST  . metoprolol succinate (TOPROL XL) 25 MG 24 hr tablet Take 0.5  tablets (12.5 mg total) by mouth daily.  . pantoprazole (PROTONIX) 40 MG tablet TAKE 1 TABLET BY MOUTH  DAILY  . ramipril (ALTACE) 10 MG capsule TAKE 1 CAPSULE BY MOUTH  TWICE DAILY  . rivaroxaban (XARELTO) 20 MG TABS tablet Take 1 tablet (20 mg total) by mouth daily with supper.  . rosuvastatin (CRESTOR) 40 MG tablet TAKE 1 TABLET BY MOUTH IN  THE EVENING  . sodium chloride (OCEAN) 0.65 % SOLN nasal spray Place 1 spray into both nostrils as needed for congestion.   No facility-administered encounter medications on file as of 02/12/2020.    Allergies as of 02/12/2020  . (No Known Allergies)    Past Medical History:  Diagnosis Date  . Allergy    seasonal- mostly spring  . Anemia 02/03/2011  . ASTHMA, CHILDHOOD 03/17/2010   mostly as child  . BCC (basal cell carcinoma) 12/08/2011  . CAROTID ARTERY OCCLUSION, WITH INFARCTION 03/17/2010  . CEREBROVASCULAR ACCIDENT 03/17/2010   partial loss of peripheral vision on left-"slight improved"  . CHICKENPOX, HX OF 03/17/2010  . Constipation 12/08/2014  . Epistaxis 09/06/2013  . FATIGUE 03/17/2010  . Hearing loss 11/04/2010   bilateral  . HYPERKALEMIA 05/27/2010  . Hyperlipidemia   . Hypertension   . Hypothyroid 12/08/2011  . Mixed hyperlipidemia 03/17/2010  . Oral lesion 11/20/2014  . Overweight(278.02) 07/01/2010  . Peripheral neuropathy 08/18/2016  . Personal history of  other infectious and parasitic disease 03/17/2010  . Preventative health care 08/23/2016  . Skin lesion of right arm 11/20/2014  . Sleep apnea    uses mouthpiece only  . Thrombocytopenia ([place]) 04/05/2012  . TOBACCO ABUSE, HX OF 03/17/2010  . Tubular adenoma of colon 06/10/2011    Past Surgical History:  Procedure Laterality Date  . CARDIOVERSION N/A 08/11/2019   Procedure: CARDIOVERSION;  Surgeon: Josue Hector, MD;  Location: [facility] ENDOSCOPY;  Service: Cardiovascular;  Laterality: N/A;  . CAROTID ENDARTERECTOMY Left February 13, 2013   cea  . CATARACT EXTRACTION, BILATERAL Right     Cataract, and stigmatism  . COLONOSCOPY  Dec. 2014  . ENDARTERECTOMY Left 02/13/2013   Procedure: ENDARTERECTOMY CAROTID;  Surgeon: Rosetta Posner, MD;  Location: [facility];  Service: Vascular;  Laterality: Left;  . EUS N/A 08/17/2013   Procedure: UPPER ENDOSCOPIC ULTRASOUND (EUS) LINEAR;  Surgeon: Milus Banister, MD;  Location: [facility] ENDOSCOPY;  Service: Endoscopy;  Laterality: N/A;  . knee cartliage repair  1964, 1990   bilateral  . [place]   removed  . ROTATOR CUFF REPAIR     right  . TONSILLECTOMY    . UPPER GI ENDOSCOPY  Jan. 2015    Family History  Problem Relation Age of Onset  . Alzheimer's disease Mother   . Emphysema Sister        cigarettes  . Cancer Sister        lung, tobacco   . Heart disease Son   . Coronary artery disease Brother   . Heart attack Brother   . Other Brother        heart problems- from fathers side  . Cancer Brother 33       lung? smoker  . Heart disease Brother 44       Heart disease before age 37  . Cancer Sister        gyn  . Heart disease Sister   . Bipolar disorder Daughter   . Obesity Daughter   . Stroke Father   . Heart disease Sister     Social History   Socioeconomic History  . Marital status: Divorced    Spouse name: Not on file  . Number of children: 3  . Years of education: 67yrs  . Highest education level: Bachelor's degree (e.g., BA, AB, BS)  Occupational History  . Occupation: retired    Fish farm manager: RETIRED  Tobacco Use  . Smoking status: Former Smoker    Packs/day: 2.00    Years: 20.00    Pack years: 40.00    Types: Cigarettes    Quit date: 07/19/1977    Years since quitting: 42.5  . Smokeless tobacco: Never Used  Vaping Use  . Vaping Use: Never used  Substance and Sexual Activity  . Alcohol use: Yes    Alcohol/week: 14.0 standard drinks    Types: 7 Glasses of wine, 7 Cans of beer per week    Comment: dinner wine daily  . Drug use: No  . Sexual activity: Never  Other Topics Concern  . Not on  file  Social History Narrative   Pt lives at home alone.    Caffeine Use: very little   Heart healthy diet, is planning exercise      Retired from Event organiser   Left handed   Social Determinants of Health   Financial Resource Strain:   . Difficulty of Paying Living Expenses:   Food Insecurity:   . Worried  About Running Out of Food in the Last Year:   . [place] in the Last Year:   Transportation Needs:   . Lack of Transportation (Medical):   [name] Lack of Transportation (Non-Medical):   Physical Activity:   . Days of Exercise per Week:   . Minutes of Exercise per Session:   Stress:   . Feeling of Stress :   Social Connections:   . Frequency of Communication with Friends and Family:   . Frequency of Social Gatherings with Friends and Family:   . Attends Religious Services:   . Active Member of Clubs or Organizations:   . Attends Archivist Meetings:   [name] Marital Status:   Intimate Partner Violence:   . Fear of Current or Ex-Partner:   . Emotionally Abused:   [name] Physically Abused:   . Sexually Abused:     Review of Systems  Constitutional: Negative for fatigue.  HENT: Negative.   Respiratory: Positive for apnea.   Cardiovascular: Negative.   Gastrointestinal: Negative.   Psychiatric/Behavioral: Positive for sleep disturbance.    Vitals:   02/12/20 1201  BP: (!) 160/68  Pulse: 60  Temp: 97.8 F (36.6 C)  SpO2: 100%    Physical Exam Constitutional:      Appearance: Normal appearance. He is well-developed.  HENT:     Head: Normocephalic and atraumatic.  Eyes:     General:        Right eye: No discharge.        Left eye: No discharge.     Pupils: Pupils are equal, round, and reactive to light.  Neck:     Thyroid: No thyromegaly.     Trachea: No tracheal deviation.  Cardiovascular:     Rate and Rhythm: Normal rate and regular rhythm.  Pulmonary:     Effort: Pulmonary effort is normal. No respiratory distress.     Breath sounds: Normal  breath sounds. No wheezing or rales.  Musculoskeletal:     Cervical back: Normal range of motion and neck supple.  Neurological:     Mental Status: He is alert.    Results of the Epworth flowsheet 05/17/2019  Sitting and reading 2  Watching TV 1  Sitting, inactive in a public place (e.g. a theatre or a meeting) 0  As a passenger in a car for an hour without a break 1  Lying down to rest in the afternoon when circumstances permit 1  Sitting and talking to someone 0  Sitting quietly after a lunch without alcohol 0  In a car, while stopped for a few minutes in traffic 0  Total score 5   Data Reviewed: Previous sleep study from 2014 reviewed showing mild obstructive sleep apnea Pressure was changed from last visit from 5-15 Currently 10-18  100% compliance Maximum pressure of 17.9 AHI 6.7, an improvement from 7.7  Assessment:  .  Mild obstructive sleep apnea -Adequately treated with CPAP therapy -Continues to benefit from CPAP therapy  .  Pathophysiology of sleep disordered breathing discussed with the patient  .  Treatment options for sleep disordered breathing discussed with the patient  .  History of coronary artery disease  .  History of CVA  Plan/Recommendations:  Encouraged to continue using CPAP on a regular basis  Continue current pressure of 10-18  Follow-up in a year  He is encouraged to call with any significant concerns  Sherrilyn Rist MD [facility] 02/12/2020, 12:10 PM  CC: Penni Homans  A, MD

## 2020-02-12 NOTE — Patient Instructions (Signed)
Adequate treatment of sleep disordered breathing  Pressure I believe is optimal at present  I will see you back in about a year  Call with any significant concerns

## 2020-02-21 ENCOUNTER — Other Ambulatory Visit: Payer: Self-pay | Admitting: Family Medicine

## 2020-02-21 MED ORDER — METOPROLOL SUCCINATE ER 25 MG PO TB24: 12.5000 mg | ORAL_TABLET | Freq: Every day | ORAL | 1 refills | Status: DC

## 2020-02-27 ENCOUNTER — Telehealth: Payer: Self-pay | Admitting: Family Medicine

## 2020-02-27 NOTE — Telephone Encounter (Signed)
[facility] Night - Client TELEPHONE ADVICE RECORD AccessNurse Patient Name: KERI TAVELLA Gender: Male DOB: Mar 29, 1935 Age: 85 Y 55 M 11 D Return Phone Number: [phone] (Primary) Address: City/State/Zip: [place] [address] Client [facility] Night - Client Client Site [facility] - Night Physician Penni Homans - MD Contact Type Call Who Is Calling Patient / Member / Family / Caregiver Call Type Triage / Clinical Relationship To Patient Self Return Phone Number [phone] (Primary) Chief Complaint Cough Reason for Call Request to Schedule Office Appointment Initial Comment PT thinks he might have Covid, having a cough that's getting worse, temp is 98.0, O2 is 97. Has been vaccinated. Translation No Nurse Assessment Nurse: Leilani Merl, RN, Heather Date/Time (Eastern Time): 02/27/2020 8:21:08 AM Confirm and document reason for call. If symptomatic, describe symptoms. ---Caller thinks he might have Covid, having a cough that's getting worse, temp is 98.0, O2 is 97. Has been vaccinated. Has the patient had close contact with a person known or suspected to have the novel coronavirus illness OR traveled / lives in area with major community spread (including international travel) in the last 14 days from the onset of symptoms? * If Asymptomatic, screen for exposure and travel within the last 14 days. ---No Does the patient have any new or worsening symptoms? ---Yes Will a triage be completed? ---Yes Related visit to physician within the last 2 weeks? ---No Does the PT have any chronic conditions? (i.e. diabetes, asthma, this includes High risk factors for pregnancy, etc.) ---Yes List chronic conditions. ---A-fib Is this a behavioral health or substance abuse call? ---No Guidelines Guideline Title Affirmed Question Affirmed Notes Nurse Date/Time (Eastern Time) COVID-19 - Diagnosed or Suspected [1] HIGH RISK  patient (e.g., age > 27 years, diabetes, heart or lung disease, weak immune system) AND [2] new or worsening symptoms Standifer, RN, Nira Conn 02/27/2020 8:21:36 AM PLEASE NOTE: All timestamps contained within this report are represented as [place] Standard Time. CONFIDENTIALTY NOTICE: This fax transmission is intended only for the addressee. It contains information that is legally privileged, confidential or otherwise protected from use or disclosure. If you are not the intended recipient, you are strictly prohibited from reviewing, disclosing, copying using or disseminating any of this information or taking any action in reliance on or regarding this information. If you have received this fax in error, please notify us immediately by telephone so that we can arrange for its return to [place]. Phone: [phone], Toll-Free: [phone], Fax: [phone] Page: 2 of 2 Call Id: [id number] [place]. Time Eilene Ghazi Time) Disposition Final User 02/27/2020 8:28:29 AM Call PCP Now Yes Standifer, RN, Soyla Murphy Disagree/Comply Comply Caller Understands Yes PreDisposition Call Doctor Care Advice Given Per Guideline CALL PCP NOW: * You need to discuss this with your doctor (or NP/PA). CALL BACK IF: * You become worse. CARE ADVICE given per COVID-19 - DIAGNOSED OR SUSPECTED (Adult) guideline. Referrals REFERRED TO PCP OFFICE

## 2020-02-27 NOTE — Telephone Encounter (Signed)
Advised patient to get tested for covid and also scheduled him a VV with Dr.Lowne

## 2020-02-27 NOTE — Telephone Encounter (Signed)
CallerOzell [name] Call Back # [phone]  Patient states he having covid symptoms,Patient would like a call back from a nurse . Patient is vaccinated and would like to know what to do next. Patient is cough, fever.

## 2020-02-28 ENCOUNTER — Encounter: Payer: Self-pay | Admitting: Family Medicine

## 2020-02-28 ENCOUNTER — Other Ambulatory Visit: Payer: Self-pay

## 2020-02-28 ENCOUNTER — Telehealth (INDEPENDENT_AMBULATORY_CARE_PROVIDER_SITE_OTHER): Payer: Medicare Other | Admitting: Family Medicine

## 2020-02-28 VITALS — Ht 74.0 in

## 2020-02-28 DIAGNOSIS — J208 Acute bronchitis due to other specified organisms: Secondary | ICD-10-CM | POA: Diagnosis not present

## 2020-02-28 NOTE — Progress Notes (Signed)
Virtual Visit via Video Note  I connected with Jonmichael Beadnell Howald on 02/28/20 at  8:40 AM EDT by a video enabled telemedicine application and verified that I am speaking with the correct person using two identifiers.  Location: Patient: home alone  Provider: home    I discussed the limitations of evaluation and management by telemedicine and the availability of in person appointments. The patient expressed understanding and agreed to proceed.  History of Present Illness: Pt is home c/o 1 week sinus drainage---then developed severe cough, productive .  He is still hoarse.  It progressived over the weekend and temp98.6--99.1   He took an at home covid test and it was negative.   He had moderna vaccine in sept ---  he was in the study He is better ---no temp last night    Past Medical History:  Diagnosis Date   Allergy    seasonal- mostly spring   Anemia 02/03/2011   ASTHMA, CHILDHOOD 03/17/2010   mostly as child   BCC (basal cell carcinoma) 12/08/2011   CAROTID ARTERY OCCLUSION, WITH INFARCTION 03/17/2010   CEREBROVASCULAR ACCIDENT 03/17/2010   partial loss of peripheral vision on left-"slight improved"   CHICKENPOX, HX OF 03/17/2010   Constipation 12/08/2014   Epistaxis 09/06/2013   FATIGUE 03/17/2010   Hearing loss 11/04/2010   bilateral   HYPERKALEMIA 05/27/2010   Hyperlipidemia    Hypertension    Hypothyroid 12/08/2011   Mixed hyperlipidemia 03/17/2010   Oral lesion 11/20/2014   Overweight(278.02) 07/01/2010   Peripheral neuropathy 08/18/2016   Personal history of other infectious and parasitic disease 03/17/2010   Preventative health care 08/23/2016   Skin lesion of right arm 11/20/2014   Sleep apnea    uses mouthpiece only   Thrombocytopenia ([place]) 04/05/2012   TOBACCO ABUSE, HX OF 03/17/2010   Tubular adenoma of colon 06/10/2011   Observations/Objective: Vitals:   02/28/20 0839  SpO2: 99%  temp 97.2  Pt is in NAD    Assessment and Plan: 1. Viral  bronchitis Improving on its own Ok to use mucinex or mucinex dm for residual cough Call or rto prn  covid test was negative     Follow Up Instructions:    I discussed the assessment and treatment plan with the patient. The patient was provided an opportunity to ask questions and all were answered. The patient agreed with the plan and demonstrated an understanding of the instructions.   The patient was advised to call back or seek an in-person evaluation if the symptoms worsen or if the condition fails to improve as anticipated.  I provided 25 minutes of non-face-to-face time during this encounter.   Ann Held, DO

## 2020-02-28 NOTE — Assessment & Plan Note (Signed)
Symptoms improving

## 2020-03-05 ENCOUNTER — Other Ambulatory Visit: Payer: Self-pay

## 2020-03-05 ENCOUNTER — Other Ambulatory Visit: Payer: Medicare Other

## 2020-03-05 DIAGNOSIS — Z20822 Contact with and (suspected) exposure to covid-19: Secondary | ICD-10-CM

## 2020-03-06 LAB — SARS-COV-2, NAA 2 DAY TAT

## 2020-03-06 LAB — NOVEL CORONAVIRUS, NAA: SARS-CoV-2, NAA: NOT DETECTED

## 2020-03-17 ENCOUNTER — Other Ambulatory Visit: Payer: Self-pay | Admitting: Family Medicine

## 2020-04-10 ENCOUNTER — Other Ambulatory Visit: Payer: Self-pay

## 2020-04-10 ENCOUNTER — Ambulatory Visit (INDEPENDENT_AMBULATORY_CARE_PROVIDER_SITE_OTHER): Payer: Medicare Other

## 2020-04-10 DIAGNOSIS — E782 Mixed hyperlipidemia: Secondary | ICD-10-CM

## 2020-04-10 DIAGNOSIS — I10 Essential (primary) hypertension: Secondary | ICD-10-CM

## 2020-04-10 DIAGNOSIS — R739 Hyperglycemia, unspecified: Secondary | ICD-10-CM | POA: Diagnosis not present

## 2020-04-10 LAB — COMPREHENSIVE METABOLIC PANEL
ALT: 21 U/L (ref 0–53)
AST: 22 U/L (ref 0–37)
Albumin: 4.3 g/dL (ref 3.5–5.2)
Alkaline Phosphatase: 68 U/L (ref 39–117)
BUN: 20 mg/dL (ref 6–23)
CO2: 31 mEq/L (ref 19–32)
Calcium: 9.3 mg/dL (ref 8.4–10.5)
Chloride: 100 mEq/L (ref 96–112)
Creatinine, Ser: 1.09 mg/dL (ref 0.40–1.50)
GFR: 64.57 mL/min (ref >60.00)
Glucose, Bld: 87 mg/dL (ref 70–99)
Potassium: 4.4 mEq/L (ref 3.5–5.1)
Sodium: 137 mEq/L (ref 135–145)
Total Bilirubin: 0.7 mg/dL (ref 0.2–1.2)
Total Protein: 6.8 g/dL (ref 6.0–8.3)

## 2020-04-10 LAB — CBC
HCT: 39.2 % (ref 39.0–52.0)
Hemoglobin: 13 g/dL (ref 13.0–17.0)
MCHC: 33.1 g/dL (ref 30.0–36.0)
MCV: 96.9 fl (ref 78.0–100.0)
Platelets: 94 10*3/uL — ABNORMAL LOW (ref 150.0–400.0)
RBC: 4.05 Mil/uL — ABNORMAL LOW (ref 4.22–5.81)
RDW: 14.3 % (ref 11.5–15.5)
WBC: 5.8 10*3/uL (ref 4.0–10.5)

## 2020-04-10 LAB — LIPID PANEL
Cholesterol: 153 mg/dL (ref 0–200)
HDL: 52.6 mg/dL (ref >39.00)
LDL Cholesterol: 85 mg/dL (ref 0–99)
NonHDL: 100.38
Total CHOL/HDL Ratio: 3
Triglycerides: 77 mg/dL (ref 0.0–149.0)
VLDL: 15.4 mg/dL (ref 0.0–40.0)

## 2020-04-10 LAB — TSH: TSH: 2.07 u[IU]/mL (ref 0.35–4.50)

## 2020-04-10 LAB — HEMOGLOBIN A1C: Hgb A1c MFr Bld: 5.9 % (ref 4.6–6.5)

## 2020-04-11 ENCOUNTER — Ambulatory Visit: Payer: Medicare Other | Admitting: Family Medicine

## 2020-04-11 ENCOUNTER — Other Ambulatory Visit: Payer: Self-pay

## 2020-04-11 VITALS — BP 132/78 | HR 65 | Temp 97.9°F | Resp 12 | Ht 74.0 in | Wt 216.2 lb

## 2020-04-11 DIAGNOSIS — R739 Hyperglycemia, unspecified: Secondary | ICD-10-CM | POA: Diagnosis not present

## 2020-04-11 DIAGNOSIS — E039 Hypothyroidism, unspecified: Secondary | ICD-10-CM | POA: Diagnosis not present

## 2020-04-11 DIAGNOSIS — E782 Mixed hyperlipidemia: Secondary | ICD-10-CM

## 2020-04-11 DIAGNOSIS — D696 Thrombocytopenia, unspecified: Secondary | ICD-10-CM

## 2020-04-11 DIAGNOSIS — Z23 Encounter for immunization: Secondary | ICD-10-CM

## 2020-04-11 DIAGNOSIS — I10 Essential (primary) hypertension: Secondary | ICD-10-CM | POA: Diagnosis not present

## 2020-04-11 DIAGNOSIS — D649 Anemia, unspecified: Secondary | ICD-10-CM

## 2020-04-11 NOTE — Patient Instructions (Signed)

## 2020-04-11 NOTE — Assessment & Plan Note (Signed)
Continues to steadily worsen but is asymptomatic, will continue to monitor

## 2020-04-11 NOTE — Assessment & Plan Note (Signed)
Tolerating statin, encouraged heart healthy diet, avoid trans fats, minimize simple carbs and saturated fats. Increase exercise as tolerated 

## 2020-04-11 NOTE — Assessment & Plan Note (Signed)
Has not returned but rbc slowly declining will continue to monitor

## 2020-04-11 NOTE — Assessment & Plan Note (Addendum)
Well controlled, no changes to meds. Encouraged heart healthy diet such as the DASH diet and exercise as tolerated. Did have one reading over 160 in past month or so but usually in the 120s to 140s. Is using Metoprolol 12.5 mg daily. Check Baseline when rested and deep breathing bid. Given High dose flu shot.

## 2020-04-11 NOTE — Progress Notes (Signed)
Subjective:    Patient ID: Tanner [name], male    DOB: 07/08/1935, 85 y.o.   MRN: [id number]  Chief Complaint  Patient presents with  . 6 month followup    HPI Patient is in today for chronic medical concerns. No recent febrile illness or hospitalizations. He is noting his blood pressure has been some labile up and down. Rarely to 160. Mostly 130s and 140s. Feels well continues to stay active and eat well. Denies CP/palp/SOB/HA/congestion/fevers/GI or GU c/o. Taking meds as prescribed  Past Medical History:  Diagnosis Date  . Allergy    seasonal- mostly spring  . Anemia 02/03/2011  . ASTHMA, CHILDHOOD 03/17/2010   mostly as child  . BCC (basal cell carcinoma) 12/08/2011  . CAROTID ARTERY OCCLUSION, WITH INFARCTION 03/17/2010  . CEREBROVASCULAR ACCIDENT 03/17/2010   partial loss of peripheral vision on left-"slight improved"  . CHICKENPOX, HX OF 03/17/2010  . Constipation 12/08/2014  . Epistaxis 09/06/2013  . FATIGUE 03/17/2010  . Hearing loss 11/04/2010   bilateral  . HYPERKALEMIA 05/27/2010  . Hyperlipidemia   . Hypertension   . Hypothyroid 12/08/2011  . Mixed hyperlipidemia 03/17/2010  . Oral lesion 11/20/2014  . Overweight(278.02) 07/01/2010  . Peripheral neuropathy 08/18/2016  . Personal history of other infectious and parasitic disease 03/17/2010  . Preventative health care 08/23/2016  . Skin lesion of right arm 11/20/2014  . Sleep apnea    uses mouthpiece only  . Thrombocytopenia ([place]) 04/05/2012  . TOBACCO ABUSE, HX OF 03/17/2010  . Tubular adenoma of colon 06/10/2011    Past Surgical History:  Procedure Laterality Date  . CARDIOVERSION N/A 08/11/2019   Procedure: CARDIOVERSION;  Surgeon: Josue Hector, MD;  Location: [facility] ENDOSCOPY;  Service: Cardiovascular;  Laterality: N/A;  . CAROTID ENDARTERECTOMY Left February 13, 2013   cea  . CATARACT EXTRACTION, BILATERAL Right    Cataract, and stigmatism  . COLONOSCOPY  Dec. 2014  . ENDARTERECTOMY Left 02/13/2013   Procedure:  ENDARTERECTOMY CAROTID;  Surgeon: Rosetta Posner, MD;  Location: [place];  Service: Vascular;  Laterality: Left;  . EUS N/A 08/17/2013   Procedure: UPPER ENDOSCOPIC ULTRASOUND (EUS) LINEAR;  Surgeon: Milus Banister, MD;  Location: [facility] ENDOSCOPY;  Service: Endoscopy;  Laterality: N/A;  . knee cartliage repair  1964, 1990   bilateral  . [place]   removed  . ROTATOR CUFF REPAIR     right  . TONSILLECTOMY    . UPPER GI ENDOSCOPY  Jan. 2015    Family History  Problem Relation Age of Onset  . Alzheimer's disease Mother   . Emphysema Sister        cigarettes  . Cancer Sister        lung, tobacco   . Heart disease Son   . Coronary artery disease Brother   . Heart attack Brother   . Other Brother        heart problems- from fathers side  . Cancer Brother 36       lung? smoker  . Heart disease Brother 53       Heart disease before age 53  . Cancer Sister        gyn  . Heart disease Sister   . Bipolar disorder Daughter   . Obesity Daughter   . Stroke Father   . Heart disease Sister     Social History   Socioeconomic History  . Marital status: Divorced    Spouse name: Not on file  .  Number of children: 3  . Years of education: 40yrs  . Highest education level: Bachelor's degree (e.g., BA, AB, BS)  Occupational History  . Occupation: retired    Fish farm manager: RETIRED  Tobacco Use  . Smoking status: Former Smoker    Packs/day: 2.00    Years: 20.00    Pack years: 40.00    Types: Cigarettes    Quit date: 07/19/1977    Years since quitting: 42.7  . Smokeless tobacco: Never Used  Vaping Use  . Vaping Use: Never used  Substance and Sexual Activity  . Alcohol use: Yes    Alcohol/week: 14.0 standard drinks    Types: 7 Glasses of wine, 7 Cans of beer per week    Comment: dinner wine daily  . Drug use: No  . Sexual activity: Never  Other Topics Concern  . Not on file  Social History Narrative   Pt lives at home alone.    Caffeine Use: very little   Heart  healthy diet, is planning exercise      Retired from Event organiser   Left handed   Social Determinants of Health   Financial Resource Strain:   . Difficulty of Paying Living Expenses: Not on file  Food Insecurity:   . Worried About Charity fundraiser in the Last Year: Not on file  . Ran Out of Food in the Last Year: Not on file  Transportation Needs:   . Lack of Transportation (Medical): Not on file  . Lack of Transportation (Non-Medical): Not on file  Physical Activity:   . Days of Exercise per Week: Not on file  . Minutes of Exercise per Session: Not on file  Stress:   . Feeling of Stress : Not on file  Social Connections:   . Frequency of Communication with Friends and Family: Not on file  . Frequency of Social Gatherings with Friends and Family: Not on file  . Attends Religious Services: Not on file  . Active Member of Clubs or Organizations: Not on file  . Attends Archivist Meetings: Not on file  . Marital Status: Not on file  Intimate Partner Violence:   . Fear of Current or Ex-Partner: Not on file  . Emotionally Abused: Not on file  . Physically Abused: Not on file  . Sexually Abused: Not on file    Outpatient Medications Prior to Visit  Medication Sig Dispense Refill  . acetaminophen (TYLENOL) 500 MG tablet Take 1,000 mg by mouth daily as needed for moderate pain or headache.    [name] aspirin EC 81 MG tablet Take 81 mg by mouth every evening.     . chlorthalidone (HYGROTON) 25 MG tablet TAKE 1 TABLET BY MOUTH IN  THE MORNING 90 tablet 3  . levothyroxine (SYNTHROID) 88 MCG tablet TAKE 1 TABLET BY MOUTH  DAILY BEFORE BREAKFAST 90 tablet 3  . linaclotide (LINZESS) 145 MCG CAPS capsule TAKE 1 CAPSULE BY MOUTH&nbsp;&nbsp;DAILY BEFORE BREAKFAST 90 capsule 3  . metoprolol succinate (TOPROL XL) 25 MG 24 hr tablet Take 0.5 tablets (12.5 mg total) by mouth daily. 90 tablet 1  . pantoprazole (PROTONIX) 40 MG tablet TAKE 1 TABLET BY MOUTH  DAILY 90 tablet 3  .  ramipril (ALTACE) 10 MG capsule TAKE 1 CAPSULE BY MOUTH  TWICE DAILY 180 capsule 3  . rivaroxaban (XARELTO) 20 MG TABS tablet Take 1 tablet (20 mg total) by mouth daily with supper. 90 tablet 3  . rosuvastatin (CRESTOR) 40 MG tablet TAKE 1 TABLET  BY MOUTH IN  THE EVENING 90 tablet 3  . sodium chloride (OCEAN) 0.65 % SOLN nasal spray Place 1 spray into both nostrils as needed for congestion.     No facility-administered medications prior to visit.    No Known Allergies  Review of Systems  Constitutional: Negative for fever and malaise/fatigue.  HENT: Negative for congestion.   Eyes: Negative for blurred vision.  Respiratory: Negative for shortness of breath.   Cardiovascular: Negative for chest pain, palpitations and leg swelling.  Gastrointestinal: Negative for abdominal pain, blood in stool and nausea.  Genitourinary: Negative for dysuria and frequency.  Musculoskeletal: Negative for falls.  Skin: Negative for rash.  Neurological: Negative for dizziness, loss of consciousness and headaches.  Endo/Heme/Allergies: Negative for environmental allergies.  Psychiatric/Behavioral: Negative for depression. The patient is not nervous/anxious.        Objective:    Physical Exam Vitals and nursing note reviewed.  Constitutional:      General: He is not in acute distress.    Appearance: He is well-developed.  HENT:     Head: Normocephalic and atraumatic.     Nose: Nose normal.  Eyes:     General:        Right eye: No discharge.        Left eye: No discharge.  Cardiovascular:     Rate and Rhythm: Normal rate and regular rhythm.     Heart sounds: No murmur heard.   Pulmonary:     Effort: Pulmonary effort is normal.     Breath sounds: Normal breath sounds.  Abdominal:     General: Bowel sounds are normal.     Palpations: Abdomen is soft.     Tenderness: There is no abdominal tenderness.  Musculoskeletal:     Cervical back: Normal range of motion and neck supple.  Skin:     General: Skin is warm and dry.  Neurological:     Mental Status: He is alert and oriented to person, place, and time.     BP (!) 143/62 (BP Location: Left Arm, Patient Position: Sitting, Cuff Size: Large)   Pulse 65   Temp 97.9 F (36.6 C) (Oral)   Resp 12   Ht 6\' 2"  (1.88 m)   Wt 216 lb 3.2 oz (98.1 kg)   SpO2 96%   BMI 27.76 kg/m  Wt Readings from Last 3 Encounters:  04/11/20 216 lb 3.2 oz (98.1 kg)  02/12/20 (!) 215 lb 3.2 oz (97.6 kg)  11/14/19 215 lb (97.5 kg)    Diabetic Foot Exam - Simple   No data filed     Lab Results  Component Value Date   WBC 5.8 04/10/2020   HGB 13.0 04/10/2020   HCT 39.2 04/10/2020   PLT 94.0 (L) 04/10/2020   GLUCOSE 87 04/10/2020   CHOL 153 04/10/2020   TRIG 77.0 04/10/2020   HDL 52.60 04/10/2020   LDLCALC 85 04/10/2020   ALT 21 04/10/2020   AST 22 04/10/2020   NA 137 04/10/2020   K 4.4 04/10/2020   CL 100 04/10/2020   CREATININE 1.09 04/10/2020   BUN 20 04/10/2020   CO2 31 04/10/2020   TSH 2.07 04/10/2020   PSA 0.39 05/08/2014   INR 0.99 02/07/2013   HGBA1C 5.9 04/10/2020   MICROALBUR 2.0 (H) 12/03/2015    Lab Results  Component Value Date   TSH 2.07 04/10/2020   Lab Results  Component Value Date   WBC 5.8 04/10/2020   HGB 13.0 04/10/2020  HCT 39.2 04/10/2020   MCV 96.9 04/10/2020   PLT 94.0 (L) 04/10/2020   Lab Results  Component Value Date   NA 137 04/10/2020   K 4.4 04/10/2020   CO2 31 04/10/2020   GLUCOSE 87 04/10/2020   BUN 20 04/10/2020   CREATININE 1.09 04/10/2020   BILITOT 0.7 04/10/2020   ALKPHOS 68 04/10/2020   AST 22 04/10/2020   ALT 21 04/10/2020   PROT 6.8 04/10/2020   ALBUMIN 4.3 04/10/2020   CALCIUM 9.3 04/10/2020   ANIONGAP 11 08/02/2019   GFR 64.57 04/10/2020   Lab Results  Component Value Date   CHOL 153 04/10/2020   Lab Results  Component Value Date   HDL 52.60 04/10/2020   Lab Results  Component Value Date   LDLCALC 85 04/10/2020   Lab Results  Component Value Date    TRIG 77.0 04/10/2020   Lab Results  Component Value Date   CHOLHDL 3 04/10/2020   Lab Results  Component Value Date   HGBA1C 5.9 04/10/2020       Assessment & Plan:   Problem List Items Addressed This Visit    Hypothyroid (Chronic)    On Levothyroxine, continue to monitor      Mixed hyperlipidemia    Tolerating statin, encouraged heart healthy diet, avoid trans fats, minimize simple carbs and saturated fats. Increase exercise as tolerated      Essential hypertension, benign    Well controlled, no changes to meds. Encouraged heart healthy diet such as the DASH diet and exercise as tolerated. Did have one reading over 160 in past month or so but usually in the 120s to 140s. Is using Metoprolol 12.5 mg daily. Check Baseline when rested and deep breathing bid. Given High dose flu shot.      Anemia    Has not returned but rbc slowly declining will continue to monitor      Thrombocytopenia ([place])    Continues to steadily worsen but is asymptomatic, will continue to monitor      Hyperglycemia    hgba1c acceptable, minimize simple carbs. Increase exercise as tolerated.          I am having Wendie Agreste. Seyfried "Phil" maintain his aspirin EC, chlorthalidone, rivaroxaban, acetaminophen, sodium chloride, linaclotide, levothyroxine, ramipril, metoprolol succinate, rosuvastatin, and pantoprazole.  No orders of the defined types were placed in this encounter.    Penni Homans, MD

## 2020-04-11 NOTE — Assessment & Plan Note (Addendum)
hgba1c acceptable, minimize simple carbs. Increase exercise as tolerated.  

## 2020-04-11 NOTE — Assessment & Plan Note (Signed)
On Levothyroxine, continue to monitor 

## 2020-05-07 ENCOUNTER — Encounter: Payer: Self-pay | Admitting: Internal Medicine

## 2020-05-07 ENCOUNTER — Other Ambulatory Visit: Payer: Self-pay | Admitting: *Deleted

## 2020-05-07 ENCOUNTER — Ambulatory Visit: Payer: Medicare Other | Admitting: Internal Medicine

## 2020-05-07 VITALS — BP 164/74 | HR 66 | Ht 74.0 in | Wt 214.4 lb

## 2020-05-07 DIAGNOSIS — R140 Abdominal distension (gaseous): Secondary | ICD-10-CM

## 2020-05-07 DIAGNOSIS — K581 Irritable bowel syndrome with constipation: Secondary | ICD-10-CM | POA: Diagnosis not present

## 2020-05-07 DIAGNOSIS — I6522 Occlusion and stenosis of left carotid artery: Secondary | ICD-10-CM

## 2020-05-07 MED ORDER — LUBIPROSTONE 8 MCG PO CAPS: 8.0000 ug | ORAL_CAPSULE | Freq: Two times a day (BID) | ORAL | 0 refills | Status: DC

## 2020-05-07 NOTE — Patient Instructions (Signed)
Please discontinue Linzess.  We have sent the following medications to your pharmacy for you to pick up at your convenience: Amitiza 8 mg twice daily with food  Continue pantoprazole 40 mg daily  Call our office in 2 weeks with an update on how you are doing.  If you are age 83 or older, your body mass index should be between 23-30. Your Body mass index is 27.52 kg/m. If this is out of the aforementioned range listed, please consider follow up with your Primary Care Provider.  If you are age 73 or younger, your body mass index should be between 19-25. Your Body mass index is 27.52 kg/m. If this is out of the aformentioned range listed, please consider follow up with your Primary Care Provider.   Due to recent changes in healthcare laws, you may see the results of your imaging and laboratory studies on MyChart before your provider has had a chance to review them.  We understand that in some cases there may be results that are confusing or concerning to you. Not all laboratory results come back in the same time frame and the provider may be waiting for multiple results in order to interpret others.  Please give [place] 48 hours in order for your provider to thoroughly review all the results before contacting the office for clarification of your results.

## 2020-05-07 NOTE — Progress Notes (Signed)
   Subjective:    Patient ID: Tanner [name], male    DOB: February 02, 1935, 85 y.o.   MRN: [id number]  HPI Fernand Sorbello is an 85 yo male with a past medical history of adenomatous colon polyps, left-sided diverticulosis, constipation with IBS type symptoms, hypertension, hyperlipidemia and hypothyroidism who is here for follow-up.  He was last here in April 2019 for his colonoscopy and seen in the office in January 2019.  He is here alone today.  Colonoscopy performed on 11/01/2017 revealed a 2 mm adenoma in the descending colon which was removed.  Diverticulosis in the left colon and medium-sized internal hemorrhoids.  Today he reports that he is doing fairly well but he continues to have issues with his bowel movements.  He has been taking Linzess 145 mcg daily but he also is having issues with loose stools at times and gas and bloating.  Most of his gas is lower GI gas.  He also has at times the feeling of incomplete evacuation.  He is not having abdominal pain.  His appetite is good.  No nausea vomiting or other upper GI complaints.  He saw Dr. [name] and she recommended possibly trying the Linzess every other day and he tried this but without the medication he felt more bloating and increased gas.  He went back to taking it every day.    Review of Systems As per HPI, otherwise negative  Current Medications, Allergies, Past Medical History, Past Surgical History, Family History and Social History were reviewed in [employer] record.     Objective:   Physical Exam BP (!) 164/74   Pulse 66   Ht 6\' 2"  (1.88 m)   Wt 214 lb 6 oz (97.2 kg)   SpO2 100%   BMI 27.52 kg/m  Gen: awake, alert, NAD HEENT: anicteric CV: RRR, no mrg Pulm: CTA b/l Abd: soft, NT/ND, +BS throughout Ext: no c/c/e Neuro: nonfocal     Assessment & Plan:  85 yo male with a past medical history of adenomatous colon polyps, left-sided diverticulosis, constipation with IBS type symptoms,  hypertension, hyperlipidemia and hypothyroidism who is here for follow-up.  1. Constipation/abd bloating --his symptoms sound irritable in nature and may represent IBS but we also discussed the possibility of SIBO.  I think it is worth trying a different laxative as he is having mostly loose stools with Linzess.  Other patients have also told me Linzess has caused increased gas for them.  I recommended the following --Discontinue Linzess and begin Amitiza 8 mcg twice daily with food --He should call me in about 10 days to 2 weeks to let me know if symptoms, specifically constipation, is improved.  Also whether or not the gas and bloating is improved.  If not but the medication is otherwise effective, I would recommend treating empirically for SIBO  2.  History of adenomatous colon polyps --2 mm adenoma in April 2019.  He expressed concern about the 5-year interval for surveillance.  We discussed this today and reassurance provided.  His next colonoscopy is recommended in April 2024.  He was comfortable with this interval after our discussion.  30 minutes total spent today including patient facing time, coordination of care, reviewing medical history/procedures/pertinent radiology studies, and documentation of the encounter.

## 2020-05-14 ENCOUNTER — Other Ambulatory Visit: Payer: Self-pay

## 2020-05-14 ENCOUNTER — Ambulatory Visit (HOSPITAL_COMMUNITY)
Admission: RE | Admit: 2020-05-14 | Discharge: 2020-05-14 | Disposition: A | Discharge status: Home / Self Care | Payer: Medicare Other | Source: Ambulatory Visit | Attending: Physician Assistant | Admitting: Physician Assistant

## 2020-05-14 ENCOUNTER — Other Ambulatory Visit: Payer: Medicare Other

## 2020-05-14 VITALS — BP 144/64 | HR 58 | Ht 74.0 in | Wt 215.8 lb

## 2020-05-14 DIAGNOSIS — Z87891 Personal history of nicotine dependence: Secondary | ICD-10-CM | POA: Insufficient documentation

## 2020-05-14 DIAGNOSIS — G4733 Obstructive sleep apnea (adult) (pediatric): Secondary | ICD-10-CM | POA: Diagnosis not present

## 2020-05-14 DIAGNOSIS — E785 Hyperlipidemia, unspecified: Secondary | ICD-10-CM | POA: Insufficient documentation

## 2020-05-14 DIAGNOSIS — I4819 Other persistent atrial fibrillation: Secondary | ICD-10-CM | POA: Diagnosis not present

## 2020-05-14 DIAGNOSIS — D6869 Other thrombophilia: Secondary | ICD-10-CM | POA: Diagnosis not present

## 2020-05-14 DIAGNOSIS — E039 Hypothyroidism, unspecified: Secondary | ICD-10-CM | POA: Insufficient documentation

## 2020-05-14 DIAGNOSIS — Z7901 Long term (current) use of anticoagulants: Secondary | ICD-10-CM | POA: Diagnosis not present

## 2020-05-14 DIAGNOSIS — Z8673 Personal history of transient ischemic attack (TIA), and cerebral infarction without residual deficits: Secondary | ICD-10-CM | POA: Insufficient documentation

## 2020-05-14 DIAGNOSIS — Z79899 Other long term (current) drug therapy: Secondary | ICD-10-CM | POA: Diagnosis not present

## 2020-05-14 DIAGNOSIS — I10 Essential (primary) hypertension: Secondary | ICD-10-CM | POA: Diagnosis not present

## 2020-05-14 DIAGNOSIS — Z20822 Contact with and (suspected) exposure to covid-19: Secondary | ICD-10-CM

## 2020-05-14 NOTE — Progress Notes (Signed)
Primary Care Physician: [name], MD Primary Cardiologist: Dr [name] Cancel Primary Electrophysiologist: none Referring Physician: Dr Shirlee More is a 85 y.o. male with a history of HTN, tobacco abuse quit 2011, CVA 2011, HLD, hypothyroidism, OSA, and new onset persistent atrial fibrillation who presents for follow up in the [facility].  The patient was initially diagnosed with atrial fibrillation 05/2019 at his PCP office after noting irregular readings on his FitBit. He is otherwise asymptomatic and rate controlled. Patient is on Xarelto for a CHADS2VASC score of 6. Patient is s/p DCCV on 08/11/19.   On follow up today, patient reports that he has done well since his last visit. He has had no symptoms of afib and no irregular readings on his FitBit. He denies any bleeding issues on anticoagulation.   Today, he denies symptoms of palpitations, chest pain, shortness of breath, orthopnea, PND, lower extremity edema, dizziness, presyncope, syncope, bleeding, or neurologic sequela. The patient is tolerating medications without difficulties and is otherwise without complaint today.    Atrial Fibrillation Risk Factors:  he does have symptoms or diagnosis of sleep apnea. he is compliant with oral device therapy. he does not have a history of rheumatic fever. he does have a history of alcohol use. The patient does not have a history of early familial atrial fibrillation or other arrhythmias.  he has a BMI of Body mass index is 27.71 kg/m.[name] Filed Weights   05/14/20 0828  Weight: 97.9 kg    Family History  Problem Relation Age of Onset  . Alzheimer's disease Mother   . Emphysema Sister        cigarettes  . Cancer Sister        lung, tobacco   . Heart disease Son   . Coronary artery disease Brother   . Heart attack Brother   . Other Brother        heart problems- from fathers side  . Cancer Brother 14       lung? smoker  . Heart disease  Brother 70       Heart disease before age 48  . Cancer Sister        gyn  . Heart disease Sister   . Bipolar disorder Daughter   . Obesity Daughter   . Stroke Father   . Heart disease Sister   . Esophageal cancer Neg Hx   . Colon cancer Neg Hx   . Pancreatic cancer Neg Hx   . Stomach cancer Neg Hx      Atrial Fibrillation Management history:  Previous antiarrhythmic drugs: none Previous cardioversions: 08/11/19 Previous ablations: none CHADS2VASC score: 6 Anticoagulation history: Xarelto   Past Medical History:  Diagnosis Date  . Allergy    seasonal- mostly spring  . Anemia 02/03/2011  . ASTHMA, CHILDHOOD 03/17/2010   mostly as child  . BCC (basal cell carcinoma) 12/08/2011  . CAROTID ARTERY OCCLUSION, WITH INFARCTION 03/17/2010  . CEREBROVASCULAR ACCIDENT 03/17/2010   partial loss of peripheral vision on left-"slight improved"  . CHICKENPOX, HX OF 03/17/2010  . Constipation 12/08/2014  . Epistaxis 09/06/2013  . FATIGUE 03/17/2010  . Hearing loss 11/04/2010   bilateral  . HYPERKALEMIA 05/27/2010  . Hyperlipidemia   . Hypertension   . Hypothyroid 12/08/2011  . Mixed hyperlipidemia 03/17/2010  . Oral lesion 11/20/2014  . Overweight(278.02) 07/01/2010  . Peripheral neuropathy 08/18/2016  . Personal history of other infectious and parasitic disease 03/17/2010  . Preventative health care  08/23/2016  . Skin lesion of right arm 11/20/2014  . Sleep apnea    uses mouthpiece only  . Thrombocytopenia ([place]) 04/05/2012  . TOBACCO ABUSE, HX OF 03/17/2010  . Tubular adenoma of colon 06/10/2011   Past Surgical History:  Procedure Laterality Date  . CARDIOVERSION N/A 08/11/2019   Procedure: CARDIOVERSION;  Surgeon: Josue Hector, MD;  Location: [facility] ENDOSCOPY;  Service: Cardiovascular;  Laterality: N/A;  . CAROTID ENDARTERECTOMY Left February 13, 2013   cea  . CATARACT EXTRACTION, BILATERAL Right    Cataract, and stigmatism  . COLONOSCOPY  Dec. 2014  . ENDARTERECTOMY Left 02/13/2013    Procedure: ENDARTERECTOMY CAROTID;  Surgeon: Rosetta Posner, MD;  Location: [place];  Service: Vascular;  Laterality: Left;  . EUS N/A 08/17/2013   Procedure: UPPER ENDOSCOPIC ULTRASOUND (EUS) LINEAR;  Surgeon: Milus Banister, MD;  Location: [facility] ENDOSCOPY;  Service: Endoscopy;  Laterality: N/A;  . knee cartliage repair  1964, 1990   bilateral  . [place]   removed  . ROTATOR CUFF REPAIR     right  . TONSILLECTOMY    . UPPER GI ENDOSCOPY  Jan. 2015    Current Outpatient Medications  Medication Sig Dispense Refill  . acetaminophen (TYLENOL) 500 MG tablet Take 1,000 mg by mouth as needed for moderate pain or headache.     [name] aspirin EC 81 MG tablet Take 81 mg by mouth every evening.     . chlorthalidone (HYGROTON) 25 MG tablet TAKE 1 TABLET BY MOUTH IN  THE MORNING 90 tablet 3  . levothyroxine (SYNTHROID) 88 MCG tablet TAKE 1 TABLET BY MOUTH  DAILY BEFORE BREAKFAST 90 tablet 3  . lubiprostone (AMITIZA) 8 MCG capsule Take 1 capsule (8 mcg total) by mouth 2 (two) times daily with a meal. D/c linzess 180 capsule 0  . metoprolol succinate (TOPROL XL) 25 MG 24 hr tablet Take 0.5 tablets (12.5 mg total) by mouth daily. 90 tablet 1  . pantoprazole (PROTONIX) 40 MG tablet TAKE 1 TABLET BY MOUTH  DAILY 90 tablet 3  . ramipril (ALTACE) 10 MG capsule TAKE 1 CAPSULE BY MOUTH  TWICE DAILY 180 capsule 3  . rivaroxaban (XARELTO) 20 MG TABS tablet Take 1 tablet (20 mg total) by mouth daily with supper. 90 tablet 3  . rosuvastatin (CRESTOR) 40 MG tablet TAKE 1 TABLET BY MOUTH IN  THE EVENING 90 tablet 3  . sodium chloride (OCEAN) 0.65 % SOLN nasal spray Place 1 spray into both nostrils as needed for congestion.     No current facility-administered medications for this encounter.    No Known Allergies  Social History   Socioeconomic History  . Marital status: Divorced    Spouse name: Not on file  . Number of children: 3  . Years of education: 8yrs  . Highest education level:  Bachelor's degree (e.g., BA, AB, BS)  Occupational History  . Occupation: retired    Fish farm manager: RETIRED  Tobacco Use  . Smoking status: Former Smoker    Packs/day: 2.00    Years: 20.00    Pack years: 40.00    Types: Cigarettes    Quit date: 07/19/1977    Years since quitting: 42.8  . Smokeless tobacco: Never Used  Vaping Use  . Vaping Use: Never used  Substance and Sexual Activity  . Alcohol use: Yes    Alcohol/week: 7.0 standard drinks    Types: 7 Glasses of wine per week    Comment: dinner wine daily  .  Drug use: No  . Sexual activity: Never  Other Topics Concern  . Not on file  Social History Narrative   Pt lives at home alone.    Caffeine Use: very little   Heart healthy diet, is planning exercise      Retired from Event organiser   Left handed   Social Determinants of Health   Financial Resource Strain:   . Difficulty of Paying Living Expenses: Not on file  Food Insecurity:   . Worried About Charity fundraiser in the Last Year: Not on file  . Ran Out of Food in the Last Year: Not on file  Transportation Needs:   . Lack of Transportation (Medical): Not on file  . Lack of Transportation (Non-Medical): Not on file  Physical Activity:   . Days of Exercise per Week: Not on file  . Minutes of Exercise per Session: Not on file  Stress:   . Feeling of Stress : Not on file  Social Connections:   . Frequency of Communication with Friends and Family: Not on file  . Frequency of Social Gatherings with Friends and Family: Not on file  . Attends Religious Services: Not on file  . Active Member of Clubs or Organizations: Not on file  . Attends Archivist Meetings: Not on file  . Marital Status: Not on file  Intimate Partner Violence:   . Fear of Current or Ex-Partner: Not on file  . Emotionally Abused: Not on file  . Physically Abused: Not on file  . Sexually Abused: Not on file     ROS- All systems are reviewed and negative except as per the HPI  above.  Physical Exam: Vitals:   05/14/20 0828  BP: (!) 144/64  Pulse: (!) 58  Weight: 97.9 kg  Height: 6\' 2"  (1.88 m)    GEN- The patient is well appearing elderly male, alert and oriented x 3 today.   HEENT-head normocephalic, atraumatic, sclera clear, conjunctiva pink, hearing intact, trachea midline. Lungs- Clear to ausculation bilaterally, normal work of breathing Heart- Regular rate and rhythm, no murmurs, rubs or gallops  GI- soft, NT, ND, + BS Extremities- no clubbing, cyanosis, or edema MS- no significant deformity or atrophy Skin- no rash or lesion Psych- euthymic mood, full affect Neuro- strength and sensation are intact   Wt Readings from Last 3 Encounters:  05/14/20 97.9 kg  05/07/20 97.2 kg  04/11/20 98.1 kg    EKG today demonstrates SB HR 58, PR 198, QRS 96, QTc 402  Echo 07/19/19 demonstrated   1. Left ventricular ejection fraction, by visual estimation, is 60 to 65%. The left ventricle has normal function. There is no left ventricular hypertrophy.  2. Left ventricular diastolic parameters are consistent with Grade II diastolic dysfunction (pseudonormalization).  3. The left ventricle has no regional wall motion abnormalities.  4. Global right ventricle has normal systolic function.The right ventricular size is normal. No increase in right ventricular wall thickness.  5. Left atrial size was moderately dilated.  6. Right atrial size was mildly dilated.  7. The mitral valve is normal in structure. Mild mitral valve regurgitation. No evidence of mitral stenosis.  8. The tricuspid valve is normal in structure.  9. Aortic valve mean gradient measures 8.0 mmHg. 10. The aortic valve is normal in structure. Aortic valve regurgitation is mild. Mild aortic valve stenosis. 11. Pulmonic regurgitation is mild. 12. The pulmonic valve was normal in structure. Pulmonic valve regurgitation is mild. 13. Normal pulmonary artery systolic  pressure. 14. The tricuspid  regurgitant velocity is 2.30 m/s, and with an assumed right atrial pressure of 3 mmHg, the estimated right ventricular systolic pressure is normal at 24.2 mmHg. 15. The inferior vena cava is normal in size with greater than 50% respiratory variability, suggesting right atrial pressure of 3 mmHg.  Epic records are reviewed at length today  CHA2DS2-VASc Score = 6 The patient's score is based upon: CHF History: No HTN History: Yes Age : 89 + Diabetes History: No Stroke History: Yes Vascular Disease History: Yes Gender: Male     ASSESSMENT AND PLAN: 1. Persistent Atrial Fibrillation (ICD10:  I48.19) The patient's CHA2DS2-VASc score is 6, indicating a 9.7% annual risk of stroke.   Patient appears to be maintaining SR. Continue Toprol 12.5 mg daily Continue Xarelto 20 mg daily.  Continue ASA per Dr [name] Cancel for carotid disease.  Recent lab work reviewed. Will need to watch platelets closely. He is being followed by PCP for this.   2. Secondary Hypercoagulable State (ICD10:  D68.69) The patient is at significant risk for stroke/thromboembolism based upon his CHA2DS2-VASc Score of 6.  Continue Rivaroxaban (Xarelto).   3. Obstructive sleep apnea Patient reports compliance with oral device.  Followed by Dr Ander Slade.  4. HTN Stable, no changes today.   Follow up with AF clinic in 6 months.    [facility] [address] [place], [place] [address] [phone] 05/14/2020 9:06 AM

## 2020-05-15 LAB — NOVEL CORONAVIRUS, NAA: SARS-CoV-2, NAA: NOT DETECTED

## 2020-05-15 LAB — SARS-COV-2, NAA 2 DAY TAT

## 2020-05-23 ENCOUNTER — Ambulatory Visit: Payer: Medicare Other | Admitting: Physician Assistant

## 2020-05-23 ENCOUNTER — Ambulatory Visit (HOSPITAL_COMMUNITY)
Admission: RE | Admit: 2020-05-23 | Discharge: 2020-05-23 | Disposition: A | Discharge status: Home / Self Care | Payer: Medicare Other | Source: Ambulatory Visit | Attending: Physician Assistant | Admitting: Physician Assistant

## 2020-05-23 ENCOUNTER — Other Ambulatory Visit: Payer: Self-pay

## 2020-05-23 VITALS — BP 144/70 | HR 60 | Temp 98.8°F | Resp 20 | Ht 74.0 in | Wt 213.9 lb

## 2020-05-23 DIAGNOSIS — I6521 Occlusion and stenosis of right carotid artery: Secondary | ICD-10-CM

## 2020-05-23 DIAGNOSIS — I6522 Occlusion and stenosis of left carotid artery: Secondary | ICD-10-CM | POA: Insufficient documentation

## 2020-05-23 NOTE — Progress Notes (Signed)
HISTORY AND PHYSICAL     CC:  follow up. Requesting Provider:  [name], MD  HPI: This is a 85 y.o. male here for follow up for carotid artery stenosis.  Pt is s/p left CEA on 7/28/201 by [name] for asymptomatic left carotid artery stenosis.    Pt was last seen 05/23/2019 and at that time he was asymptomatic. He was on a daily walking program.   He has hx of afib.   Pt returns today for follow up.  Pt denies any amaurosis fugax, speech difficulties, weakness, numbness, paralysis or clumsiness.  He is doing well and continues to take his Xarelto for afib.  He states that the mother of his children, his former wife passed away yesterday.    The pt is on a statin for cholesterol management.  The pt is on a daily aspirin.   Other AC:  Xarelto The pt is on BB, ACEI for hypertension.   The pt is not diabetic.   Tobacco hx:  former  Pt does not have family hx of AAA.  Past Medical History:  Diagnosis Date  . Allergy    seasonal- mostly spring  . Anemia 02/03/2011  . ASTHMA, CHILDHOOD 03/17/2010   mostly as child  . BCC (basal cell carcinoma) 12/08/2011  . CAROTID ARTERY OCCLUSION, WITH INFARCTION 03/17/2010  . CEREBROVASCULAR ACCIDENT 03/17/2010   partial loss of peripheral vision on left-"slight improved"  . CHICKENPOX, HX OF 03/17/2010  . Constipation 12/08/2014  . Epistaxis 09/06/2013  . FATIGUE 03/17/2010  . Hearing loss 11/04/2010   bilateral  . HYPERKALEMIA 05/27/2010  . Hyperlipidemia   . Hypertension   . Hypothyroid 12/08/2011  . Mixed hyperlipidemia 03/17/2010  . Oral lesion 11/20/2014  . Overweight(278.02) 07/01/2010  . Peripheral neuropathy 08/18/2016  . Personal history of other infectious and parasitic disease 03/17/2010  . Preventative health care 08/23/2016  . Skin lesion of right arm 11/20/2014  . Sleep apnea    uses mouthpiece only  . Thrombocytopenia ([place]) 04/05/2012  . TOBACCO ABUSE, HX OF 03/17/2010  . Tubular adenoma of colon 06/10/2011    Past Surgical  History:  Procedure Laterality Date  . CARDIOVERSION N/A 08/11/2019   Procedure: CARDIOVERSION;  Surgeon: Josue Hector, MD;  Location: [facility] ENDOSCOPY;  Service: Cardiovascular;  Laterality: N/A;  . CAROTID ENDARTERECTOMY Left February 13, 2013   cea  . CATARACT EXTRACTION, BILATERAL Right    Cataract, and stigmatism  . COLONOSCOPY  Dec. 2014  . ENDARTERECTOMY Left 02/13/2013   Procedure: ENDARTERECTOMY CAROTID;  Surgeon: Rosetta Posner, MD;  Location: [place];  Service: Vascular;  Laterality: Left;  . EUS N/A 08/17/2013   Procedure: UPPER ENDOSCOPIC ULTRASOUND (EUS) LINEAR;  Surgeon: Milus Banister, MD;  Location: [facility] ENDOSCOPY;  Service: Endoscopy;  Laterality: N/A;  . knee cartliage repair  1964, 1990   bilateral  . [place]   removed  . ROTATOR CUFF REPAIR     right  . TONSILLECTOMY    . UPPER GI ENDOSCOPY  Jan. 2015    No Known Allergies  Current Outpatient Medications  Medication Sig Dispense Refill  . acetaminophen (TYLENOL) 500 MG tablet Take 1,000 mg by mouth as needed for moderate pain or headache.     [name] aspirin EC 81 MG tablet Take 81 mg by mouth every evening.     . chlorthalidone (HYGROTON) 25 MG tablet TAKE 1 TABLET BY MOUTH IN  THE MORNING 90 tablet 3  . levothyroxine (  SYNTHROID) 88 MCG tablet TAKE 1 TABLET BY MOUTH  DAILY BEFORE BREAKFAST 90 tablet 3  . lubiprostone (AMITIZA) 8 MCG capsule Take 1 capsule (8 mcg total) by mouth 2 (two) times daily with a meal. D/c linzess 180 capsule 0  . metoprolol succinate (TOPROL XL) 25 MG 24 hr tablet Take 0.5 tablets (12.5 mg total) by mouth daily. 90 tablet 1  . pantoprazole (PROTONIX) 40 MG tablet TAKE 1 TABLET BY MOUTH  DAILY 90 tablet 3  . ramipril (ALTACE) 10 MG capsule TAKE 1 CAPSULE BY MOUTH  TWICE DAILY 180 capsule 3  . rivaroxaban (XARELTO) 20 MG TABS tablet Take 1 tablet (20 mg total) by mouth daily with supper. 90 tablet 3  . rosuvastatin (CRESTOR) 40 MG tablet TAKE 1 TABLET BY MOUTH IN  THE EVENING 90  tablet 3  . sodium chloride (OCEAN) 0.65 % SOLN nasal spray Place 1 spray into both nostrils as needed for congestion.     No current facility-administered medications for this visit.    Family History  Problem Relation Age of Onset  . Alzheimer's disease Mother   . Emphysema Sister        cigarettes  . Cancer Sister        lung, tobacco   . Heart disease Son   . Coronary artery disease Brother   . Heart attack Brother   . Other Brother        heart problems- from fathers side  . Cancer Brother 91       lung? smoker  . Heart disease Brother 75       Heart disease before age 43  . Cancer Sister        gyn  . Heart disease Sister   . Bipolar disorder Daughter   . Obesity Daughter   . Stroke Father   . Heart disease Sister   . Esophageal cancer Neg Hx   . Colon cancer Neg Hx   . Pancreatic cancer Neg Hx   . Stomach cancer Neg Hx     Social History   Socioeconomic History  . Marital status: Divorced    Spouse name: Not on file  . Number of children: 3  . Years of education: 47yrs  . Highest education level: Bachelor's degree (e.g., BA, AB, BS)  Occupational History  . Occupation: retired    Fish farm manager: RETIRED  Tobacco Use  . Smoking status: Former Smoker    Packs/day: 2.00    Years: 20.00    Pack years: 40.00    Types: Cigarettes    Quit date: 07/19/1977    Years since quitting: 42.8  . Smokeless tobacco: Never Used  Vaping Use  . Vaping Use: Never used  Substance and Sexual Activity  . Alcohol use: Yes    Alcohol/week: 7.0 standard drinks    Types: 7 Glasses of wine per week    Comment: dinner wine daily  . Drug use: No  . Sexual activity: Never  Other Topics Concern  . Not on file  Social History Narrative   Pt lives at home alone.    Caffeine Use: very little   Heart healthy diet, is planning exercise      Retired from Event organiser   Left handed   Social Determinants of Health   Financial Resource Strain:   . Difficulty of Paying Living  Expenses: Not on file  Food Insecurity:   . Worried About Charity fundraiser in the Last Year: Not on file  .  Ran Out of Food in the Last Year: Not on file  Transportation Needs:   . Lack of Transportation (Medical): Not on file  . Lack of Transportation (Non-Medical): Not on file  Physical Activity:   . Days of Exercise per Week: Not on file  . Minutes of Exercise per Session: Not on file  Stress:   . Feeling of Stress : Not on file  Social Connections:   . Frequency of Communication with Friends and Family: Not on file  . Frequency of Social Gatherings with Friends and Family: Not on file  . Attends Religious Services: Not on file  . Active Member of Clubs or Organizations: Not on file  . Attends Archivist Meetings: Not on file  . Marital Status: Not on file  Intimate Partner Violence:   . Fear of Current or Ex-Partner: Not on file  . Emotionally Abused: Not on file  . Physically Abused: Not on file  . Sexually Abused: Not on file     REVIEW OF SYSTEMS:   [X]  denotes positive finding, [ ]  denotes negative finding Cardiac  Comments:  Chest pain or chest pressure:    Shortness of breath upon exertion:    Short of breath when lying flat:    Irregular heart rhythm:        Vascular    Pain in calf, thigh, or hip brought on by ambulation:    Pain in feet at night that wakes you up from your sleep:     Blood clot in your veins:    Leg swelling:         Pulmonary    Oxygen at home:    Productive cough:     Wheezing:         Neurologic    Sudden weakness in arms or legs:     Sudden numbness in arms or legs:     Sudden onset of difficulty speaking or slurred speech:    Temporary loss of vision in one eye:     Problems with dizziness:         Gastrointestinal    Blood in stool:     Vomited blood:         Genitourinary    Burning when urinating:     Blood in urine:        Psychiatric    Major depression:         Hematologic    Bleeding problems:      Problems with blood clotting too easily:        Skin    Rashes or ulcers:        Constitutional    Fever or chills:      PHYSICAL EXAMINATION:  Today's Vitals   05/23/20 1044 05/23/20 1047  BP: (!) 150/70 (!) 144/70  Pulse: 60   Resp: 20   Temp: 98.8 F (37.1 C)   TempSrc: Temporal   SpO2: 99%   Weight: 213 lb 14.4 oz (97 kg)   Height: 6\' 2"  (1.88 m)    Body mass index is 27.46 kg/m.   General:  WDWN in NAD; vital signs documented above Gait: Not observed HENT: WNL, normocephalic Pulmonary: normal non-labored breathing Cardiac: regular HR, without murmur with right carotid bruit Abdomen: soft, NT, no masses; aortic pulse is not palpable Skin: without rashes Vascular Exam/Pulses:  Right Left  Radial 2+ (normal) 2+ (normal)  Ulnar Unable to palpate Unable to palpate   Extremities: without ischemic changes, without Gangrene ,  without cellulitis; without open wounds;  Musculoskeletal: no muscle wasting or atrophy  Neurologic: A&O X 3; moving all extremities equally Psychiatric:  The pt has Normal affect.   Non-Invasive Vascular Imaging:   Carotid Duplex on 05/23/2020: Right:  Occluded with slight recanalization of the CCA, ECA ( retrograde) and  proximal  ICA.  Left:  1-39% ICA stenosis Vertebrals: Bilateral vertebral arteries demonstrate antegrade flow.  Subclavians: Normal flow hemodynamics were seen in bilateral subclavian arteries.  Previous Carotid duplex on 06/08/2020: Carotid duplex reveals occlusion of his internal carotid on the right.  There does appear to be some recanalization of his common carotid and external carotid.  His left endarterectomy is widely patent with no stenosis   ASSESSMENT/PLAN:: 85 y.o. male here for follow up carotid artery stenosis with hx of left CEA in 2014 by [name].   -duplex today is essentially unchanged from a year ago.  The right is occluded with slight recanalization of the CCA, ECA and proximal ICA and 1-39% on the  left.  He remains asymptomatic.   -discussed s/s of stroke with pt and he understands should he develop any of these sx, he will go to the nearest ER. -pt will f/u in one year with carotid duplex -pt will call sooner should they have any issues.   Leontine Locket, [facility] [phone]  Clinic MD:  Oneida Alar

## 2020-05-28 ENCOUNTER — Telehealth: Payer: Self-pay | Admitting: Family Medicine

## 2020-05-28 NOTE — Telephone Encounter (Signed)
Left message for patient to call back and schedule Medicare Annual Wellness Visit (AWV) with Nurse Health Advisor. This can be scheduled IN OFFICE or TELEPHONE VISIT.   This should be a 40 minute visit  Last AWV 06/04/15

## 2020-05-30 ENCOUNTER — Other Ambulatory Visit: Payer: Self-pay

## 2020-05-30 ENCOUNTER — Telehealth: Payer: Self-pay | Admitting: Internal Medicine

## 2020-05-30 MED ORDER — LUBIPROSTONE 8 MCG PO CAPS
8.0000 ug | ORAL_CAPSULE | Freq: Two times a day (BID) | ORAL | 3 refills | Status: DC
Start: 2020-05-30 — End: 2021-03-27

## 2020-05-30 NOTE — Telephone Encounter (Signed)
Okay to refill and continue Amitiza twice daily as he requested

## 2020-05-30 NOTE — Telephone Encounter (Signed)
See my chart message

## 2020-06-04 ENCOUNTER — Ambulatory Visit: Payer: Medicare Other | Admitting: Neurology

## 2020-06-06 NOTE — Progress Notes (Signed)
NEUROLOGY FOLLOW UP OFFICE NOTE  Tanner [name] [id number]   Subjective:  Tanner [name] an 85 year old left-handed male with atrial fibrillation, hypertension, hyperlipidemia, hypothyroidism, OSA and former smoker who follows up for right MCA/PCA watershed infarct, carotid artery disease with right ICA occlusion and s/p left CEA, and peripheral neuropathy.  UPDATE: Current medications:  Xarelto, ASA 81mg , Crestor 40mg , Altace, Amitiza, Hygroton, Synthroid  04/10/2020 LABS:  Hgb A1c 5.9, LDL 85  He is feeling well. Diagnosed with atrial fibrillation.  On Xarelto Carotid ultrasound from 05/23/2020 showed slight recanalization of the CCA, ECA and proximal ICA and 1-39% stenosis of left ICA.  HISTORY: Stroke:  In August 2011, he had a stroke, presenting as left visual field cut.He had a right hemispheric stroke thought to be secondary to right ICA occlusion.MRI of brain revealed an acute watershed infarct in the right PCA/MCA distributions.CTA of the neck confirmed right common carotid artery occlusion at the origin.The left ICA revealed approximately 50% stenosis just distal to the bulb.CTA of the head revealed occluded right ICA with partial retrograde constitution from the left INCA via the PCA.There was opacification of the right MCA and right ACA from the left ICA via the Acomm.There was occlusion of the right PCA at its origin.Hgb A1c at that time was 5.6 and LDL was 210.  He was placed on Plavix.Follow up carotid dopplers revealed increased stenosis of the left ICA.Carotid doppler from 01/03/13 revealed more than 70% stenosis involving the left bulb and left proximal ICA and 50-69% stenosis involving the left mid ICA, but without plaque.Stenosis of left ECA and left VA noted.CTA of the neck performed on 01/19/13 revealed 75-80% stenosis of the left ICA.He underwent a left CEA on 02/13/13.  02/07/13 2D Echo:LVEF 55-65%, no regional wall motion  abnormalities, mild MVR, mildly dilated left atrium, mildly dilated right ventricle, mildly increased systolic pressure in pulCarotid doppler on 04/02/15 showed right ICA occluded. Left ICA patent. No plaqueHe followed up with vascular surgery on 04/07/16. Carotid doppler performed then again revealed known right ICA occlusion with patent left carotid endarterectomy site, not significantly changed compared to last exam.  Carotid doppler from 05/23/19 showed recanalization of the right CCA, ECA and retrograde via ECA recanalization of the proximal right ICA. Patent left carotid endarterectomy site with no evidence of restenosis. Bilateral vertebral arteries with antegrade flow.  Neuropathy:  He reports numbness on the bottom of his feet, mostly his toes. He also has a discomfort involving the left shin, but it is not a pain. He says she sometimes catches his toe of his left foot on the ground and may stumble. This has been ongoing since his stroke. He also reports having fractured his left ankle twice. He does report crossing his legs. He notes muscle cramps in the legs, which bother him at night. NCV-EMG from 04/28/16 revealed chronic symmetric sensorimotor polyneuropathy. Neuropathy labs include: ANA negative, Sed Rate 3, B6 43.8, SPEP/IFE/UPEP negative, B12 855, TSH 2.07.  PAST MEDICAL HISTORY: Past Medical History:  Diagnosis Date  . Allergy    seasonal- mostly spring  . Anemia 02/03/2011  . ASTHMA, CHILDHOOD 03/17/2010   mostly as child  . BCC (basal cell carcinoma) 12/08/2011  . CAROTID ARTERY OCCLUSION, WITH INFARCTION 03/17/2010  . CEREBROVASCULAR ACCIDENT 03/17/2010   partial loss of peripheral vision on left-"slight improved"  . CHICKENPOX, HX OF 03/17/2010  . Constipation 12/08/2014  . Epistaxis 09/06/2013  . FATIGUE 03/17/2010  . Hearing loss 11/04/2010   bilateral  . HYPERKALEMIA 05/27/2010  .  Hyperlipidemia   . Hypertension   . Hypothyroid 12/08/2011  . Mixed hyperlipidemia  03/17/2010  . Oral lesion 11/20/2014  . Overweight(278.02) 07/01/2010  . Peripheral neuropathy 08/18/2016  . Personal history of other infectious and parasitic disease 03/17/2010  . Preventative health care 08/23/2016  . Skin lesion of right arm 11/20/2014  . Sleep apnea    uses mouthpiece only  . Thrombocytopenia ([place]) 04/05/2012  . TOBACCO ABUSE, HX OF 03/17/2010  . Tubular adenoma of colon 06/10/2011    MEDICATIONS: Current Outpatient Medications on File Prior to Visit  Medication Sig Dispense Refill  . acetaminophen (TYLENOL) 500 MG tablet Take 1,000 mg by mouth as needed for moderate pain or headache.     [name] aspirin EC 81 MG tablet Take 81 mg by mouth every evening.     . chlorthalidone (HYGROTON) 25 MG tablet TAKE 1 TABLET BY MOUTH IN  THE MORNING 90 tablet 3  . levothyroxine (SYNTHROID) 88 MCG tablet TAKE 1 TABLET BY MOUTH  DAILY BEFORE BREAKFAST 90 tablet 3  . lubiprostone (AMITIZA) 8 MCG capsule Take 1 capsule (8 mcg total) by mouth 2 (two) times daily with a meal. D/c linzess 180 capsule 3  . metoprolol succinate (TOPROL XL) 25 MG 24 hr tablet Take 0.5 tablets (12.5 mg total) by mouth daily. 90 tablet 1  . pantoprazole (PROTONIX) 40 MG tablet TAKE 1 TABLET BY MOUTH  DAILY 90 tablet 3  . ramipril (ALTACE) 10 MG capsule TAKE 1 CAPSULE BY MOUTH  TWICE DAILY 180 capsule 3  . rivaroxaban (XARELTO) 20 MG TABS tablet Take 1 tablet (20 mg total) by mouth daily with supper. 90 tablet 3  . rosuvastatin (CRESTOR) 40 MG tablet TAKE 1 TABLET BY MOUTH IN  THE EVENING 90 tablet 3  . sodium chloride (OCEAN) 0.65 % SOLN nasal spray Place 1 spray into both nostrils as needed for congestion.     No current facility-administered medications on file prior to visit.    ALLERGIES: No Known Allergies  FAMILY HISTORY: Family History  Problem Relation Age of Onset  . Alzheimer's disease Mother   . Emphysema Sister        cigarettes  . Cancer Sister        lung, tobacco   . Heart disease Son   .  Coronary artery disease Brother   . Heart attack Brother   . Other Brother        heart problems- from fathers side  . Cancer Brother 40       lung? smoker  . Heart disease Brother 38       Heart disease before age 39  . Cancer Sister        gyn  . Heart disease Sister   . Bipolar disorder Daughter   . Obesity Daughter   . Stroke Father   . Heart disease Sister   . Esophageal cancer Neg Hx   . Colon cancer Neg Hx   . Pancreatic cancer Neg Hx   . Stomach cancer Neg Hx    SOCIAL HISTORY: Social History   Socioeconomic History  . Marital status: Divorced    Spouse name: Not on file  . Number of children: 3  . Years of education: 24yrs  . Highest education level: Bachelor's degree (e.g., BA, AB, BS)  Occupational History  . Occupation: retired    Fish farm manager: RETIRED  Tobacco Use  . Smoking status: Former Smoker    Packs/day: 2.00    Years: 20.00  Pack years: 40.00    Types: Cigarettes    Quit date: 07/19/1977    Years since quitting: 42.9  . Smokeless tobacco: Never Used  Vaping Use  . Vaping Use: Never used  Substance and Sexual Activity  . Alcohol use: Yes    Alcohol/week: 7.0 standard drinks    Types: 7 Glasses of wine per week    Comment: dinner wine daily  . Drug use: No  . Sexual activity: Never  Other Topics Concern  . Not on file  Social History Narrative   Pt lives at home alone.    Caffeine Use: very little   Heart healthy diet, is planning exercise      Retired from Event organiser   Left handed   Social Determinants of Health   Financial Resource Strain:   . Difficulty of Paying Living Expenses: Not on file  Food Insecurity:   . Worried About Charity fundraiser in the Last Year: Not on file  . Ran Out of Food in the Last Year: Not on file  Transportation Needs:   . Lack of Transportation (Medical): Not on file  . Lack of Transportation (Non-Medical): Not on file  Physical Activity:   . Days of Exercise per Week: Not on file  . Minutes  of Exercise per Session: Not on file  Stress:   . Feeling of Stress : Not on file  Social Connections:   . Frequency of Communication with Friends and Family: Not on file  . Frequency of Social Gatherings with Friends and Family: Not on file  . Attends Religious Services: Not on file  . Active Member of Clubs or Organizations: Not on file  . Attends Archivist Meetings: Not on file  . Marital Status: Not on file  Intimate Partner Violence:   . Fear of Current or Ex-Partner: Not on file  . Emotionally Abused: Not on file  . Physically Abused: Not on file  . Sexually Abused: Not on file     Objective:  Blood pressure (!) 166/77, pulse 70, resp. rate 18, height 6\' 2"  (1.88 m), weight 218 lb (98.9 kg), SpO2 96 %. General: No acute distress.  Patient appears well-groomed.   Head:  Normocephalic/atraumatic Eyes:  Fundi examined but not visualized Neck: supple, no paraspinal tenderness, full range of motion Heart:  Regular rate and rhythm Lungs:  Clear to auscultation bilaterally Back: No paraspinal tenderness Neurological Exam: alert and oriented to person, place, and time. Attention span and concentration intact, recent and remote memory intact, fund of knowledge intact.  Speech fluent and not dysarthric, language intact.  Left homonymous hemianopsia.  Otherwise, CN II-XII intact. Bulk and tone normal, muscle strength 5/5 throughout.  Sensation to pinprick reduced in feet up to knees and vibration reduced in feet.  Deep tendon reflexes 1+ throughout except absent in ankles, toes downgoing.  Finger to nose and heel to shin testing intact.  Gait normal, Romberg negative.   Assessment/Plan:   1.  Left homonymous hemianopsia as late effect of right MCA/PCA watershed infarct secondary to right ICA and PCA occlusions 2.  Asymptomatic left ICA disease s/p left CEA stable 3.  Hyperlipidemia 4.  Idiopathic polyneuropathy 5. Atrial fibrillation  1.  Secondary stroke prevention as  managed by PCP: -  ASA and Xarleto -  Statin.  LDL goal less than 70 -  Blood pressure control -  Glycemic control Hgb A1c goal less than 7 2.  Annual carotid dopplers 3.  For further  workup of neuropathy, will check ACE. 4.  Follow up one year.  Metta Clines, DO  CC: Penni Homans, MD

## 2020-06-07 ENCOUNTER — Ambulatory Visit: Payer: Medicare Other

## 2020-06-07 ENCOUNTER — Other Ambulatory Visit: Payer: Self-pay

## 2020-06-07 ENCOUNTER — Ambulatory Visit: Payer: Medicare Other | Admitting: Neurology

## 2020-06-07 ENCOUNTER — Encounter: Payer: Self-pay | Admitting: Neurology

## 2020-06-07 VITALS — BP 166/77 | HR 70 | Resp 18 | Ht 74.0 in | Wt 218.0 lb

## 2020-06-07 DIAGNOSIS — E039 Hypothyroidism, unspecified: Secondary | ICD-10-CM

## 2020-06-07 DIAGNOSIS — G609 Hereditary and idiopathic neuropathy, unspecified: Secondary | ICD-10-CM | POA: Diagnosis not present

## 2020-06-07 DIAGNOSIS — I10 Essential (primary) hypertension: Secondary | ICD-10-CM

## 2020-06-07 DIAGNOSIS — I63231 Cerebral infarction due to unspecified occlusion or stenosis of right carotid arteries: Secondary | ICD-10-CM

## 2020-06-07 DIAGNOSIS — E782 Mixed hyperlipidemia: Secondary | ICD-10-CM

## 2020-06-07 DIAGNOSIS — I4819 Other persistent atrial fibrillation: Secondary | ICD-10-CM

## 2020-06-07 DIAGNOSIS — R739 Hyperglycemia, unspecified: Secondary | ICD-10-CM

## 2020-06-07 DIAGNOSIS — I6521 Occlusion and stenosis of right carotid artery: Secondary | ICD-10-CM

## 2020-06-07 NOTE — Patient Instructions (Signed)
1.  We will check ACE level for cause of neuropathy 2.  Continue current management 3.  Follow up one year.

## 2020-06-07 NOTE — Addendum Note (Signed)
Addended by: Venetia Night on: 06/07/2020 09:31 AM   Modules accepted: Orders

## 2020-06-07 NOTE — Addendum Note (Signed)
Addended by: Octaviano Glow on: 06/07/2020 02:01 PM   Modules accepted: Orders

## 2020-06-10 LAB — ANGIOTENSIN CONVERTING ENZYME: Angiotensin-Converting Enzyme: 5 U/L — ABNORMAL LOW (ref 9–67)

## 2020-06-10 NOTE — Progress Notes (Signed)
Tried calling pt. No answer. LMVOM

## 2020-06-11 ENCOUNTER — Telehealth: Payer: Self-pay | Admitting: Neurology

## 2020-06-11 NOTE — Progress Notes (Signed)
Pt advised of his labs.

## 2020-06-11 NOTE — Telephone Encounter (Signed)
Patient left message with after hours stating he is returning a call from the office in regards to his lab results.

## 2020-07-05 ENCOUNTER — Ambulatory Visit (INDEPENDENT_AMBULATORY_CARE_PROVIDER_SITE_OTHER): Payer: Medicare Other

## 2020-07-05 ENCOUNTER — Other Ambulatory Visit: Payer: Self-pay

## 2020-07-05 DIAGNOSIS — R739 Hyperglycemia, unspecified: Secondary | ICD-10-CM | POA: Diagnosis not present

## 2020-07-05 DIAGNOSIS — E782 Mixed hyperlipidemia: Secondary | ICD-10-CM | POA: Diagnosis not present

## 2020-07-05 DIAGNOSIS — I4819 Other persistent atrial fibrillation: Secondary | ICD-10-CM

## 2020-07-05 DIAGNOSIS — I10 Essential (primary) hypertension: Secondary | ICD-10-CM | POA: Diagnosis not present

## 2020-07-05 DIAGNOSIS — G609 Hereditary and idiopathic neuropathy, unspecified: Secondary | ICD-10-CM

## 2020-07-05 DIAGNOSIS — I6521 Occlusion and stenosis of right carotid artery: Secondary | ICD-10-CM

## 2020-07-05 DIAGNOSIS — I63231 Cerebral infarction due to unspecified occlusion or stenosis of right carotid arteries: Secondary | ICD-10-CM

## 2020-07-05 DIAGNOSIS — E039 Hypothyroidism, unspecified: Secondary | ICD-10-CM

## 2020-07-05 LAB — HEMOGLOBIN A1C: Hgb A1c MFr Bld: 5.7 % (ref 4.6–6.5)

## 2020-07-05 LAB — TSH: TSH: 1.46 u[IU]/mL (ref 0.35–4.50)

## 2020-07-05 LAB — LIPID PANEL
Cholesterol: 140 mg/dL (ref 0–200)
HDL: 53.8 mg/dL (ref >39.00)
LDL Cholesterol: 72 mg/dL (ref 0–99)
NonHDL: 85.82
Total CHOL/HDL Ratio: 3
Triglycerides: 68 mg/dL (ref 0.0–149.0)
VLDL: 13.6 mg/dL (ref 0.0–40.0)

## 2020-07-05 LAB — CBC WITH DIFFERENTIAL/PLATELET
Basophils Absolute: 0 10*3/uL (ref 0.0–0.1)
Basophils Relative: 0.7 % (ref 0.0–3.0)
Eosinophils Absolute: 0.3 10*3/uL (ref 0.0–0.7)
Eosinophils Relative: 4.1 % (ref 0.0–5.0)
HCT: 38.7 % — ABNORMAL LOW (ref 39.0–52.0)
Hemoglobin: 12.7 g/dL — ABNORMAL LOW (ref 13.0–17.0)
Lymphocytes Relative: 24.4 % (ref 12.0–46.0)
Lymphs Abs: 1.6 10*3/uL (ref 0.7–4.0)
MCHC: 32.9 g/dL (ref 30.0–36.0)
MCV: 94.8 fl (ref 78.0–100.0)
Monocytes Absolute: 0.7 10*3/uL (ref 0.1–1.0)
Monocytes Relative: 10.3 % (ref 3.0–12.0)
Neutro Abs: 4.1 10*3/uL (ref 1.4–7.7)
Neutrophils Relative %: 60.5 % (ref 43.0–77.0)
Platelets: 77 10*3/uL — ABNORMAL LOW (ref 150.0–400.0)
RBC: 4.09 Mil/uL — ABNORMAL LOW (ref 4.22–5.81)
RDW: 14.8 % (ref 11.5–15.5)
WBC: 6.7 10*3/uL (ref 4.0–10.5)

## 2020-07-05 LAB — COMPREHENSIVE METABOLIC PANEL
ALT: 16 U/L (ref 0–53)
AST: 17 U/L (ref 0–37)
Albumin: 4.3 g/dL (ref 3.5–5.2)
Alkaline Phosphatase: 65 U/L (ref 39–117)
BUN: 21 mg/dL (ref 6–23)
CO2: 31 mEq/L (ref 19–32)
Calcium: 9.3 mg/dL (ref 8.4–10.5)
Chloride: 100 mEq/L (ref 96–112)
Creatinine, Ser: 1.2 mg/dL (ref 0.40–1.50)
GFR: 55.94 mL/min — ABNORMAL LOW (ref >60.00)
Glucose, Bld: 91 mg/dL (ref 70–99)
Potassium: 4.3 mEq/L (ref 3.5–5.1)
Sodium: 137 mEq/L (ref 135–145)
Total Bilirubin: 1 mg/dL (ref 0.2–1.2)
Total Protein: 6.9 g/dL (ref 6.0–8.3)

## 2020-07-05 NOTE — Progress Notes (Signed)
Coming!

## 2020-07-08 ENCOUNTER — Other Ambulatory Visit: Payer: Self-pay

## 2020-07-08 ENCOUNTER — Other Ambulatory Visit: Payer: Self-pay | Admitting: Family Medicine

## 2020-07-08 ENCOUNTER — Telehealth: Payer: Self-pay | Admitting: Family

## 2020-07-08 DIAGNOSIS — D649 Anemia, unspecified: Secondary | ICD-10-CM

## 2020-07-08 DIAGNOSIS — D518 Other vitamin B12 deficiency anemias: Secondary | ICD-10-CM

## 2020-07-08 DIAGNOSIS — D7589 Other specified diseases of blood and blood-forming organs: Secondary | ICD-10-CM

## 2020-07-08 DIAGNOSIS — D696 Thrombocytopenia, unspecified: Secondary | ICD-10-CM

## 2020-07-08 DIAGNOSIS — E538 Deficiency of other specified B group vitamins: Secondary | ICD-10-CM

## 2020-07-08 NOTE — Telephone Encounter (Signed)
Caloled and LMVM for patient regarding New patient appointment that has been scheduled.  I have mailed a copy of the schedule along with letter & map to his home address listed

## 2020-07-18 ENCOUNTER — Ambulatory Visit (HOSPITAL_COMMUNITY): Payer: Medicare Other | Attending: Cardiology

## 2020-07-18 ENCOUNTER — Other Ambulatory Visit: Payer: Self-pay

## 2020-07-18 DIAGNOSIS — I350 Nonrheumatic aortic (valve) stenosis: Secondary | ICD-10-CM

## 2020-07-18 LAB — ECHOCARDIOGRAM COMPLETE
AR max vel: 2.32 cm2
AV Area VTI: 2.33 cm2
AV Area mean vel: 2.27 cm2
AV Mean grad: 7 mmHg
AV Peak grad: 12.4 mmHg
Ao pk vel: 1.76 m/s
Area-P 1/2: 2.93 cm2
S' Lateral: 2.7 cm

## 2020-07-22 ENCOUNTER — Telehealth: Payer: Self-pay | Admitting: Cardiovascular Disease

## 2020-07-22 NOTE — Telephone Encounter (Signed)
Patient is aware of echo results.

## 2020-07-22 NOTE — Telephone Encounter (Signed)
Patient returning Pam's call.

## 2020-07-24 ENCOUNTER — Other Ambulatory Visit: Payer: Self-pay | Admitting: Cardiovascular Disease

## 2020-07-24 ENCOUNTER — Other Ambulatory Visit: Payer: Self-pay | Admitting: Family Medicine

## 2020-07-24 DIAGNOSIS — I4819 Other persistent atrial fibrillation: Secondary | ICD-10-CM

## 2020-07-24 NOTE — Telephone Encounter (Signed)
Prescription refill request for Xarelto received.   Indication:Afib Last office visit: [place] 05/14/2020 Weight: 98.9 kg Age: 86 yo Scr: 1.20, 07/05/2020 CrCl: 65 ml/min    Duplicate refill request,  prescription sent earlier today [facility], pt on the correct dose.

## 2020-07-26 ENCOUNTER — Ambulatory Visit: Payer: Medicare Other

## 2020-07-29 ENCOUNTER — Telehealth: Payer: Self-pay | Admitting: Cardiovascular Disease

## 2020-07-29 ENCOUNTER — Other Ambulatory Visit: Payer: Self-pay | Admitting: Family

## 2020-07-29 DIAGNOSIS — D696 Thrombocytopenia, unspecified: Secondary | ICD-10-CM

## 2020-07-29 DIAGNOSIS — I4819 Other persistent atrial fibrillation: Secondary | ICD-10-CM

## 2020-07-29 MED ORDER — RIVAROXABAN 20 MG PO TABS: 20.0000 mg | ORAL_TABLET | Freq: Every day | ORAL | 1 refills | Status: DC

## 2020-07-29 NOTE — Telephone Encounter (Signed)
Patient made aware we sent it in. Says he got text from [facility] that it was being processed. Advised that if he runs out before it get there, we can give sample

## 2020-07-29 NOTE — Telephone Encounter (Signed)
Rx sent again to [facility] Receipt confirmed by pharmacy E-Prescribing Status: Receipt confirmed by pharmacy (07/29/2020 11:34 AM EST)

## 2020-07-29 NOTE — Telephone Encounter (Signed)
*  STAT* If patient is at the pharmacy, call can be transferred to refill team.   1. Which medications need to be refilled? (please list name of each medication and dose if known) rivaroxaban (XARELTO) 20 MG TABS tablet  2. Which pharmacy/location (including street and city if local pharmacy) is medication to be sent to? [facility], [address] [place], [address]  3. Do they need a 30 day or 90 day supply? 90 day supply   Called pharmacy to confirm they did not receive prescription sent on 07/24/20. Patient only has 1 weeks worth of medication left.

## 2020-07-30 ENCOUNTER — Inpatient Hospital Stay (HOSPITAL_BASED_OUTPATIENT_CLINIC_OR_DEPARTMENT_OTHER): Payer: Medicare Other | Admitting: Family

## 2020-07-30 ENCOUNTER — Inpatient Hospital Stay: Payer: Medicare Other | Attending: Family

## 2020-07-30 ENCOUNTER — Other Ambulatory Visit: Payer: Self-pay

## 2020-07-30 ENCOUNTER — Encounter: Payer: Self-pay | Admitting: Family

## 2020-07-30 VITALS — BP 148/72 | HR 60 | Temp 97.9°F | Resp 18 | Ht 74.0 in | Wt 212.1 lb

## 2020-07-30 DIAGNOSIS — Z7901 Long term (current) use of anticoagulants: Secondary | ICD-10-CM | POA: Insufficient documentation

## 2020-07-30 DIAGNOSIS — K648 Other hemorrhoids: Secondary | ICD-10-CM | POA: Diagnosis not present

## 2020-07-30 DIAGNOSIS — E782 Mixed hyperlipidemia: Secondary | ICD-10-CM | POA: Insufficient documentation

## 2020-07-30 DIAGNOSIS — D696 Thrombocytopenia, unspecified: Secondary | ICD-10-CM | POA: Insufficient documentation

## 2020-07-30 DIAGNOSIS — Z87891 Personal history of nicotine dependence: Secondary | ICD-10-CM | POA: Diagnosis not present

## 2020-07-30 DIAGNOSIS — E039 Hypothyroidism, unspecified: Secondary | ICD-10-CM | POA: Diagnosis not present

## 2020-07-30 DIAGNOSIS — J45909 Unspecified asthma, uncomplicated: Secondary | ICD-10-CM | POA: Diagnosis not present

## 2020-07-30 DIAGNOSIS — K5730 Diverticulosis of large intestine without perforation or abscess without bleeding: Secondary | ICD-10-CM | POA: Diagnosis not present

## 2020-07-30 DIAGNOSIS — G4730 Sleep apnea, unspecified: Secondary | ICD-10-CM | POA: Diagnosis not present

## 2020-07-30 DIAGNOSIS — Z79899 Other long term (current) drug therapy: Secondary | ICD-10-CM | POA: Diagnosis not present

## 2020-07-30 DIAGNOSIS — I10 Essential (primary) hypertension: Secondary | ICD-10-CM | POA: Diagnosis not present

## 2020-07-30 DIAGNOSIS — Z7982 Long term (current) use of aspirin: Secondary | ICD-10-CM | POA: Diagnosis not present

## 2020-07-30 DIAGNOSIS — D649 Anemia, unspecified: Secondary | ICD-10-CM | POA: Insufficient documentation

## 2020-07-30 LAB — CBC WITH DIFFERENTIAL (CANCER CENTER ONLY)
Abs Immature Granulocytes: 0.02 10*3/uL (ref 0.00–0.07)
Basophils Absolute: 0.1 10*3/uL (ref 0.0–0.1)
Basophils Relative: 1 %
Eosinophils Absolute: 0.4 10*3/uL (ref 0.0–0.5)
Eosinophils Relative: 6 %
HCT: 40 % (ref 39.0–52.0)
Hemoglobin: 13.3 g/dL (ref 13.0–17.0)
Immature Granulocytes: 0 %
Lymphocytes Relative: 27 %
Lymphs Abs: 1.7 10*3/uL (ref 0.7–4.0)
MCH: 31.3 pg (ref 26.0–34.0)
MCHC: 33.3 g/dL (ref 30.0–36.0)
MCV: 94.1 fL (ref 80.0–100.0)
Monocytes Absolute: 0.7 10*3/uL (ref 0.1–1.0)
Monocytes Relative: 11 %
Neutro Abs: 3.5 10*3/uL (ref 1.7–7.7)
Neutrophils Relative %: 55 %
Platelet Count: 162 10*3/uL (ref 150–400)
RBC: 4.25 MIL/uL (ref 4.22–5.81)
RDW: 13.8 % (ref 11.5–15.5)
WBC Count: 6.4 10*3/uL (ref 4.0–10.5)
nRBC: 0 % (ref 0.0–0.2)

## 2020-07-30 LAB — CMP (CANCER CENTER ONLY)
ALT: 19 U/L (ref 0–44)
AST: 18 U/L (ref 15–41)
Albumin: 4.5 g/dL (ref 3.5–5.0)
Alkaline Phosphatase: 52 U/L (ref 38–126)
Anion gap: 10 (ref 5–15)
BUN: 23 mg/dL (ref 8–23)
CO2: 27 mmol/L (ref 22–32)
Calcium: 9.8 mg/dL (ref 8.9–10.3)
Chloride: 100 mmol/L (ref 98–111)
Creatinine: 1.2 mg/dL (ref 0.61–1.24)
GFR, Estimated: >60 mL/min (ref >60)
Glucose, Bld: 102 mg/dL — ABNORMAL HIGH (ref 70–99)
Potassium: 4.3 mmol/L (ref 3.5–5.1)
Sodium: 137 mmol/L (ref 135–145)
Total Bilirubin: 0.7 mg/dL (ref 0.3–1.2)
Total Protein: 7.6 g/dL (ref 6.5–8.1)

## 2020-07-30 LAB — LACTATE DEHYDROGENASE: LDH: 164 U/L (ref 98–192)

## 2020-07-30 LAB — SAVE SMEAR(SSMR), FOR PROVIDER SLIDE REVIEW

## 2020-07-30 LAB — PLATELET BY CITRATE

## 2020-07-30 NOTE — Progress Notes (Signed)
Hematology/Oncology Consultation   Name: Tanner [name]      MRN: [id number]    Location: Room/bed info not found  Date: 07/30/2020 Time:2:13 PM   REFERRING PHYSICIAN: Penni Homans, MD  REASON FOR CONSULT: Thrombocytopenia and anemia    DIAGNOSIS: No diagnosis found.  HISTORY OF PRESENT ILLNESS: Mr. Tolen is a very pleasant 86 yo caucasian gentleman with a history of thrombocytopenia over the last 10 months and mild anemia over the last month.  His platelet count last month was 77 and Hgb 12.7. Today his platelets are 162 and Hgb 13.3. LFT's are within normal limits.  He denies any episodes of blood loss. No abnormal bruising, no petechiae.  He is on Xarelto as well as a baby aspirin daily for atrial fib and history of CVA dur to cerebral artery occlusion.  He does enjoy wine in the evenings as well as a glass of Scotch and occasionally a beer. We discussed the effects that this could have on his bone marrow and he will try to cut back.  He notes some fatigue.  No diabetes. He is on synthroid for hypothyroidism. TSH last month was stable at 1.46.  No fever, chills, n/v, cough, rash, dizziness, SOB, chest pain, palpitations, abdominal pain or changes in bowel or bladder habits.  No adenopathy noted on exam.  No swelling, or tenderness in his extremities. Pedal pulses are 2+.  No falls or syncopal episodes to report.  He has occasional tingling in his fingertips. This comes and goes.  He is on a 5 year colonoscopy plan due to history of colonic polyps. His most recent colonoscopy was in April 2019 with Dr. Hilarie Fredrickson. He noted internal hemorrhoids, multiple small and large mouth diverticula in the sigmoid and descending colon and removed 1 benign polyp from the descending colon.  He states that he had a "precancerous basal cell" lesion removed from the top of his right ear several years ago and has had no other issues.  Family cancer history includes: sister - male or stomach (unsure),  sister - lung (smoker) and a half brother - lung.  He states that his ex wife passed away in Jun 19, 2023 and this has been a difficult adjustment for him. He states that they remained best friends after divorcing in the 3's and walked and shared meals together each day. He has not been as active walking and working out since she passed away and admits that he has not been eating as healthy.  He has maintained a good appetite and is staying well hydrated. His weight is stable at 212 lbs.  He quit smoking in 1978.  He retired from the [employer] after 32 years of service. We thanked him for his service.   ROS: All other 10 point review of systems is negative.   PAST MEDICAL HISTORY:   Past Medical History:  Diagnosis Date  . Allergy    seasonal- mostly spring  . Anemia 02/03/2011  . ASTHMA, CHILDHOOD 03/17/2010   mostly as child  . BCC (basal cell carcinoma) 12/08/2011  . CAROTID ARTERY OCCLUSION, WITH INFARCTION 03/17/2010  . CEREBROVASCULAR ACCIDENT 03/17/2010   partial loss of peripheral vision on left-"slight improved"  . CHICKENPOX, HX OF 03/17/2010  . Constipation 12/08/2014  . Epistaxis 09/06/2013  . FATIGUE 03/17/2010  . Hearing loss 11/04/2010   bilateral  . HYPERKALEMIA 05/27/2010  . Hyperlipidemia   . Hypertension   . Hypothyroid 12/08/2011  . Mixed hyperlipidemia 03/17/2010  . Oral lesion 11/20/2014  .  Overweight(278.02) 07/01/2010  . Peripheral neuropathy 08/18/2016  . Personal history of other infectious and parasitic disease 03/17/2010  . Preventative health care 08/23/2016  . Skin lesion of right arm 11/20/2014  . Sleep apnea    uses mouthpiece only  . Thrombocytopenia ([place]) 04/05/2012  . TOBACCO ABUSE, HX OF 03/17/2010  . Tubular adenoma of colon 06/10/2011    ALLERGIES: No Known Allergies    MEDICATIONS:  Current Outpatient Medications on File Prior to Visit  Medication Sig Dispense Refill  . acetaminophen (TYLENOL) 500 MG tablet Take 1,000 mg by mouth as  needed for moderate pain or headache.     [name] aspirin EC 81 MG tablet Take 81 mg by mouth every evening.     . chlorthalidone (HYGROTON) 25 MG tablet TAKE 1 TABLET BY MOUTH IN  THE MORNING 90 tablet 3  . levothyroxine (SYNTHROID) 88 MCG tablet TAKE 1 TABLET BY MOUTH  DAILY BEFORE BREAKFAST 90 tablet 3  . lubiprostone (AMITIZA) 8 MCG capsule Take 1 capsule (8 mcg total) by mouth 2 (two) times daily with a meal. D/c linzess 180 capsule 3  . metoprolol succinate (TOPROL XL) 25 MG 24 hr tablet Take 0.5 tablets (12.5 mg total) by mouth daily. 90 tablet 1  . pantoprazole (PROTONIX) 40 MG tablet TAKE 1 TABLET BY MOUTH  DAILY 90 tablet 3  . ramipril (ALTACE) 10 MG capsule TAKE 1 CAPSULE BY MOUTH  TWICE DAILY 180 capsule 3  . rivaroxaban (XARELTO) 20 MG TABS tablet Take 1 tablet (20 mg total) by mouth daily with supper. 90 tablet 1  . rosuvastatin (CRESTOR) 40 MG tablet TAKE 1 TABLET BY MOUTH IN  THE EVENING 90 tablet 3  . sodium chloride (OCEAN) 0.65 % SOLN nasal spray Place 1 spray into both nostrils as needed for congestion.     No current facility-administered medications on file prior to visit.     PAST SURGICAL HISTORY Past Surgical History:  Procedure Laterality Date  . CARDIOVERSION N/A 08/11/2019   Procedure: CARDIOVERSION;  Surgeon: Josue Hector, MD;  Location: [facility] ENDOSCOPY;  Service: Cardiovascular;  Laterality: N/A;  . CAROTID ENDARTERECTOMY Left February 13, 2013   cea  . CATARACT EXTRACTION, BILATERAL Right    Cataract, and stigmatism  . COLONOSCOPY  Dec. 2014  . ENDARTERECTOMY Left 02/13/2013   Procedure: ENDARTERECTOMY CAROTID;  Surgeon: Rosetta Posner, MD;  Location: [facility];  Service: Vascular;  Laterality: Left;  . EUS N/A 08/17/2013   Procedure: UPPER ENDOSCOPIC ULTRASOUND (EUS) LINEAR;  Surgeon: Milus Banister, MD;  Location: [facility] ENDOSCOPY;  Service: Endoscopy;  Laterality: N/A;  . knee cartliage repair  1964, 1990   bilateral  . [place]   removed  . ROTATOR  CUFF REPAIR     right  . TONSILLECTOMY    . UPPER GI ENDOSCOPY  Jan. 2015    FAMILY HISTORY: Family History  Problem Relation Age of Onset  . Alzheimer's disease Mother   . Emphysema Sister        cigarettes  . Cancer Sister        lung, tobacco   . Heart disease Son   . Coronary artery disease Brother   . Heart attack Brother   . Other Brother        heart problems- from fathers side  . Cancer Brother 71       lung? smoker  . Heart disease Brother 18       Heart disease before age  37  . Cancer Sister        gyn  . Heart disease Sister   . Bipolar disorder Daughter   . Obesity Daughter   . Stroke Father   . Heart disease Sister   . Esophageal cancer Neg Hx   . Colon cancer Neg Hx   . Pancreatic cancer Neg Hx   . Stomach cancer Neg Hx     SOCIAL HISTORY:  reports that he quit smoking about 43 years ago. His smoking use included cigarettes. He has a 40.00 pack-year smoking history. He has never used smokeless tobacco. He reports current alcohol use of about 7.0 standard drinks of alcohol per week. He reports that he does not use drugs.  PERFORMANCE STATUS: The patient's performance status is 0 - Asymptomatic  PHYSICAL EXAM: Most Recent Vital Signs: There were no vitals taken for this visit. BP (!) 148/72 (BP Location: Left Arm, Patient Position: Sitting)   Pulse 60   Temp 97.9 F (36.6 C) (Oral)   Resp 18   Ht '6\' 2"'  (1.88 m)   Wt 212 lb 1.9 oz (96.2 kg)   SpO2 100%   BMI 27.23 kg/m   General Appearance:    Alert, cooperative, no distress, appears stated age  Head:    Normocephalic, without obvious abnormality, atraumatic  Eyes:    PERRL, conjunctiva/corneas clear, EOM's intact, fundi    benign, both eyes             Throat:   Lips, mucosa, and tongue normal; teeth and gums normal  Neck:   Supple, symmetrical, trachea midline, no adenopathy;       thyroid:  No enlargement/tenderness/nodules; no carotid   bruit or JVD  Back:     Symmetric, no curvature,  ROM normal, no CVA tenderness  Lungs:     Clear to auscultation bilaterally, respirations unlabored  Chest wall:    No tenderness or deformity  Heart:    Regular rate and rhythm, S1 and S2 normal, no murmur, rub   or gallop  Abdomen:     Soft, non-tender, bowel sounds active all four quadrants,    no masses, no organomegaly        Extremities:   Extremities normal, atraumatic, no cyanosis or edema  Pulses:   2+ and symmetric all extremities  Skin:   Skin color, texture, turgor normal, no rashes or lesions  Lymph nodes:   Cervical, supraclavicular, and axillary nodes normal  Neurologic:   CNII-XII intact. Normal strength, sensation and reflexes      throughout    LABORATORY DATA:  Results for orders placed or performed in visit on 07/30/20 (from the past 48 hour(s))  CBC with Differential ([facility] Only)     Status: None   Collection Time: 07/30/20  1:41 PM  Result Value Ref Range   WBC Count 6.4 4.0 - 10.5 K/uL   RBC 4.25 4.22 - 5.81 MIL/uL   Hemoglobin 13.3 13.0 - 17.0 g/dL   HCT 40.0 39.0 - 52.0 %   MCV 94.1 80.0 - 100.0 fL   MCH 31.3 26.0 - 34.0 pg   MCHC 33.3 30.0 - 36.0 g/dL   RDW 13.8 11.5 - 15.5 %   Platelet Count 162 150 - 400 K/uL    Comment: EDTA platelet count consistent with citrate.   nRBC 0.0 0.0 - 0.2 %   Neutrophils Relative % 55 %   Neutro Abs 3.5 1.7 - 7.7 K/uL   Lymphocytes Relative 27 %  Lymphs Abs 1.7 0.7 - 4.0 K/uL   Monocytes Relative 11 %   Monocytes Absolute 0.7 0.1 - 1.0 K/uL   Eosinophils Relative 6 %   Eosinophils Absolute 0.4 0.0 - 0.5 K/uL   Basophils Relative 1 %   Basophils Absolute 0.1 0.0 - 0.1 K/uL   Immature Granulocytes 0 %   Abs Immature Granulocytes 0.02 0.00 - 0.07 K/uL    Comment: Performed at [facility] at [facility], [address] [place], [place] [address]  Save Smear [facility])     Status: None   Collection Time: 07/30/20  1:41 PM  Result Value Ref Range   Smear Review SMEAR STAINED AND  AVAILABLE FOR REVIEW     Comment: Performed at [facility] at [facility], [address] [place], [place] [address]  Platelet by [facility]     Status: None   Collection Time: 07/30/20  1:41 PM  Result Value Ref Range   Platelet CT in [facility] consistent with PLTS     Comment: Performed at [facility] at [facility], [address], [place], [place] [address]      RADIOGRAPHY: No results found.     PATHOLOGY: None  ASSESSMENT/PLAN: Mr. Handshoe is a very pleasant 86 yo caucasian gentleman with a history of thrombocytopenia over the last 10 months and mild anemia over the last month.  His counts at this time have resolved and are within normal limits.  I was able to go over his lab work with Dr. Marin Olp and he reviewed the patient's blood smear. No abnormality or evidence of malignancy noted. WBCs are mature and well granulated. No bone marrow biopsy needed.  He will cut back on his alcohol intake.  At this time we will release him back to his PCP.   All questions were answered and he is in agreement. He was encouraged to contact our office with any questions or concerns. We can certainly see him again for any future heme/onc issues.   The patient was discussed with Dr. Marin Olp and he is in agreement with the aforementioned.   Laverna Peace, NP

## 2020-07-31 ENCOUNTER — Telehealth: Payer: Self-pay

## 2020-07-31 NOTE — Telephone Encounter (Signed)
No 07/30/20 LOS entered    aom

## 2020-08-07 ENCOUNTER — Other Ambulatory Visit: Payer: Self-pay

## 2020-08-07 ENCOUNTER — Ambulatory Visit (INDEPENDENT_AMBULATORY_CARE_PROVIDER_SITE_OTHER): Payer: Medicare Other

## 2020-08-07 ENCOUNTER — Ambulatory Visit: Payer: Medicare Other

## 2020-08-07 DIAGNOSIS — E538 Deficiency of other specified B group vitamins: Secondary | ICD-10-CM

## 2020-08-07 DIAGNOSIS — D649 Anemia, unspecified: Secondary | ICD-10-CM

## 2020-08-07 LAB — COMPREHENSIVE METABOLIC PANEL WITH GFR
ALT: 13 U/L (ref 0–53)
AST: 13 U/L (ref 0–37)
Albumin: 4.5 g/dL (ref 3.5–5.2)
Alkaline Phosphatase: 55 U/L (ref 39–117)
BUN: 19 mg/dL (ref 6–23)
CO2: 32 meq/L (ref 19–32)
Calcium: 9.7 mg/dL (ref 8.4–10.5)
Chloride: 103 meq/L (ref 96–112)
Creatinine, Ser: 1.12 mg/dL (ref 0.40–1.50)
GFR: 60.73 mL/min
Glucose, Bld: 86 mg/dL (ref 70–99)
Potassium: 4.1 meq/L (ref 3.5–5.1)
Sodium: 140 meq/L (ref 135–145)
Total Bilirubin: 0.7 mg/dL (ref 0.2–1.2)
Total Protein: 6.7 g/dL (ref 6.0–8.3)

## 2020-08-07 LAB — CBC WITH DIFFERENTIAL/PLATELET
Basophils Absolute: 0.1 10*3/uL (ref 0.0–0.1)
Basophils Relative: 0.9 % (ref 0.0–3.0)
Eosinophils Absolute: 0.3 10*3/uL (ref 0.0–0.7)
Eosinophils Relative: 4.9 % (ref 0.0–5.0)
HCT: 40 % (ref 39.0–52.0)
Hemoglobin: 13.3 g/dL (ref 13.0–17.0)
Lymphocytes Relative: 25.4 % (ref 12.0–46.0)
Lymphs Abs: 1.7 10*3/uL (ref 0.7–4.0)
MCHC: 33.4 g/dL (ref 30.0–36.0)
MCV: 93.5 fl (ref 78.0–100.0)
Monocytes Absolute: 0.6 10*3/uL (ref 0.1–1.0)
Monocytes Relative: 8.6 % (ref 3.0–12.0)
Neutro Abs: 4.1 10*3/uL (ref 1.4–7.7)
Neutrophils Relative %: 60.2 % (ref 43.0–77.0)
Platelets: 53 10*3/uL — ABNORMAL LOW (ref 150.0–400.0)
RBC: 4.27 Mil/uL (ref 4.22–5.81)
RDW: 15.1 % (ref 11.5–15.5)
WBC: 6.8 10*3/uL (ref 4.0–10.5)

## 2020-08-07 LAB — VITAMIN B12: Vitamin B-12: 748 pg/mL (ref 211–911)

## 2020-08-08 ENCOUNTER — Telehealth: Payer: Self-pay

## 2020-08-08 ENCOUNTER — Other Ambulatory Visit: Payer: Self-pay | Admitting: Family Medicine

## 2020-08-08 DIAGNOSIS — Z7289 Other problems related to lifestyle: Secondary | ICD-10-CM

## 2020-08-08 DIAGNOSIS — Z789 Other specified health status: Secondary | ICD-10-CM

## 2020-08-08 DIAGNOSIS — D696 Thrombocytopenia, unspecified: Secondary | ICD-10-CM

## 2020-08-08 DIAGNOSIS — R109 Unspecified abdominal pain: Secondary | ICD-10-CM

## 2020-08-08 DIAGNOSIS — K5900 Constipation, unspecified: Secondary | ICD-10-CM

## 2020-08-08 NOTE — Telephone Encounter (Signed)
Called pt with repeat lab work in two weeks per sch message   Courvoisier Hamblen

## 2020-08-13 ENCOUNTER — Encounter: Payer: Self-pay | Admitting: Family Medicine

## 2020-08-13 ENCOUNTER — Ambulatory Visit: Payer: Medicare Other | Admitting: Family Medicine

## 2020-08-13 ENCOUNTER — Other Ambulatory Visit: Payer: Self-pay

## 2020-08-13 VITALS — BP 114/62 | HR 74 | Temp 98.2°F | Resp 16 | Wt 210.0 lb

## 2020-08-13 DIAGNOSIS — F4321 Adjustment disorder with depressed mood: Secondary | ICD-10-CM

## 2020-08-13 DIAGNOSIS — D696 Thrombocytopenia, unspecified: Secondary | ICD-10-CM

## 2020-08-13 DIAGNOSIS — E039 Hypothyroidism, unspecified: Secondary | ICD-10-CM | POA: Diagnosis not present

## 2020-08-13 DIAGNOSIS — E782 Mixed hyperlipidemia: Secondary | ICD-10-CM

## 2020-08-13 DIAGNOSIS — I10 Essential (primary) hypertension: Secondary | ICD-10-CM

## 2020-08-13 DIAGNOSIS — I4819 Other persistent atrial fibrillation: Secondary | ICD-10-CM

## 2020-08-13 DIAGNOSIS — R739 Hyperglycemia, unspecified: Secondary | ICD-10-CM

## 2020-08-13 NOTE — Patient Instructions (Addendum)
You need [place] complete of abdomen, order already placed. Stop by radiology to make appointment   Thrombocytopenia Thrombocytopenia is a condition in which you have a low number of platelets in your blood. Platelets are also called thrombocytes. Platelets are tiny cells in the blood. When you bleed, they clump together at the cut or injury to stop the bleeding. This is called blood clotting. Not having enough platelets can cause bleeding problems. Some cases of thrombocytopenia are mild while others are more severe. What are the causes? This condition may be caused by:  Decreased production of platelets. This may be caused by: ? Aplastic anemia. This is when your bone marrow stops making blood cells. ? Cancer in the bone marrow. ? Certain medicines, including chemotherapy. ? Infection in the bone marrow. ? Drinking a lot of alcohol.  Increased destruction of platelets. This may be caused by: ? Certain immune diseases. ? Certain medicines. ? Certain blood clotting disorders. ? Certain inherited disorders. ? Certain bleeding disorders. ? Pregnancy. ? Having an enlarged spleen (hypersplenism). In hypersplenism, the spleen gathers up platelets from circulation. This means that the platelets are not available to help with blood clotting. The spleen can be enlarged because of cirrhosis or other conditions. What are the signs or symptoms? Symptoms of this condition are the result of poor blood clotting. They will vary depending on how low the platelet counts are. Symptoms may include:  Abnormal bleeding.  Nosebleeds.  Heavy menstrual periods.  Blood in the urine or stool (feces).  A purplish discoloration in the skin (purpura).  Bruising.  A rash that looks like pinpoint, purplish-red spots (petechiae) on the skin and mucous membranes. How is this diagnosed? This condition may be diagnosed with blood tests and a physical exam. Sometimes, a sample of bone marrow may be removed to look  for the original cells (megakaryocytes) that make platelets. Other tests may be needed depending on the cause.   How is this treated? Treatment for this condition depends on the cause. Treatment options may include:  Treatment of another condition that is causing the low platelet count.  Medicines to help protect your platelets from being destroyed.  A replacement (transfusion) of platelets to stop or prevent bleeding.  Surgery to remove the spleen. Follow these instructions at home: Activity  Avoid activities that could cause injury or bruising, and follow instructions about how to prevent falls.  Take extra care not to cut yourself when you shave or when you use scissors, needles, knives, and other tools.  Take extra care to protect yourself from burns when ironing or cooking. General instructions  Check your skin and the inside of your mouth for bruising or bleeding as told by your health care provider.  Check your spit (sputum), urine, and stool for blood as told by your health care provider.  Do not drink alcohol.  Take over-the-counter and prescription medicines only as told by your health care provider.  Do not take any medicines that have aspirin or NSAIDs in them. These medicines can thin your blood and cause you to bleed more easily.  Tell all your health care providers, including dentists and eye doctors, about your condition.   Contact a health care provider if you have:  Unexplained bruising. Get help right away if you have:  Active bleeding from anywhere on your body.  Blood in your sputum, urine, or stool. Summary  Thrombocytopenia is a condition in which you have a low number of platelets in your blood.  Platelets are needed for blood clotting.  Symptoms of this condition are the result of poor blood clotting and may include abnormal bleeding, nosebleeds, and bruising.  This condition may be diagnosed with blood tests and a physical exam.  Treatment  for this condition depends on the cause. This information is not intended to replace advice given to you by your health care provider. Make sure you discuss any questions you have with your health care provider. Document Revised: 04/07/2018 Document Reviewed: 04/07/2018 Elsevier Patient Education  [place].

## 2020-08-13 NOTE — Assessment & Plan Note (Signed)
Well controlled, no changes to meds. Encouraged heart healthy diet such as the DASH diet and exercise as tolerated.  °

## 2020-08-14 DIAGNOSIS — F4321 Adjustment disorder with depressed mood: Secondary | ICD-10-CM | POA: Insufficient documentation

## 2020-08-14 NOTE — Assessment & Plan Note (Signed)
Tolerating statin, encouraged heart healthy diet, avoid trans fats, minimize simple carbs and saturated fats. Increase exercise as tolerated 

## 2020-08-14 NOTE — Assessment & Plan Note (Signed)
On Levothyroxine, continue to monitor 

## 2020-08-14 NOTE — Progress Notes (Signed)
Subjective:    Patient ID: Tanner [name], male    DOB: 1935-06-14, 86 y.o.   MRN: [id number]  Chief Complaint  Patient presents with  . Follow-up    HPI Patient is in today for follow up on chronic medical concerns. No recent febrile illness or hospitalizations. He is struggling with grief over the recent death of his exwife with whom he stayed incredibly close. He is working thru his grief with his daughters and he feels he is managing OK. Denies CP/palp/SOB/HA/congestion/fevers/GI or GU c/o. Taking meds as prescribed  Past Medical History:  Diagnosis Date  . Allergy    seasonal- mostly spring  . Anemia 02/03/2011  . ASTHMA, CHILDHOOD 03/17/2010   mostly as child  . BCC (basal cell carcinoma) 12/08/2011  . CAROTID ARTERY OCCLUSION, WITH INFARCTION 03/17/2010  . CEREBROVASCULAR ACCIDENT 03/17/2010   partial loss of peripheral vision on left-"slight improved"  . CHICKENPOX, HX OF 03/17/2010  . Constipation 12/08/2014  . Epistaxis 09/06/2013  . FATIGUE 03/17/2010  . Hearing loss 11/04/2010   bilateral  . HYPERKALEMIA 05/27/2010  . Hyperlipidemia   . Hypertension   . Hypothyroid 12/08/2011  . Mixed hyperlipidemia 03/17/2010  . Oral lesion 11/20/2014  . Overweight(278.02) 07/01/2010  . Peripheral neuropathy 08/18/2016  . Personal history of other infectious and parasitic disease 03/17/2010  . Preventative health care 08/23/2016  . Skin lesion of right arm 11/20/2014  . Sleep apnea    uses mouthpiece only  . Thrombocytopenia ([place]) 04/05/2012  . TOBACCO ABUSE, HX OF 03/17/2010  . Tubular adenoma of colon 06/10/2011    Past Surgical History:  Procedure Laterality Date  . CARDIOVERSION N/A 08/11/2019   Procedure: CARDIOVERSION;  Surgeon: Josue Hector, MD;  Location: [facility] ENDOSCOPY;  Service: Cardiovascular;  Laterality: N/A;  . CAROTID ENDARTERECTOMY Left February 13, 2013   cea  . CATARACT EXTRACTION, BILATERAL Right    Cataract, and stigmatism  . COLONOSCOPY  Dec. 2014  . ENDARTERECTOMY  Left 02/13/2013   Procedure: ENDARTERECTOMY CAROTID;  Surgeon: Rosetta Posner, MD;  Location: [place];  Service: Vascular;  Laterality: Left;  . EUS N/A 08/17/2013   Procedure: UPPER ENDOSCOPIC ULTRASOUND (EUS) LINEAR;  Surgeon: Milus Banister, MD;  Location: [facility] ENDOSCOPY;  Service: Endoscopy;  Laterality: N/A;  . knee cartliage repair  1964, 1990   bilateral  . [place]   removed  . ROTATOR CUFF REPAIR     right  . TONSILLECTOMY    . UPPER GI ENDOSCOPY  Jan. 2015    Family History  Problem Relation Age of Onset  . Alzheimer's disease Mother   . Emphysema Sister        cigarettes  . Cancer Sister        lung, tobacco   . Heart disease Son   . Coronary artery disease Brother   . Heart attack Brother   . Other Brother        heart problems- from fathers side  . Cancer Brother 69       lung? smoker  . Heart disease Brother 71       Heart disease before age 9  . Cancer Sister        gyn  . Heart disease Sister   . Bipolar disorder Daughter   . Obesity Daughter   . Stroke Father   . Heart disease Sister   . Esophageal cancer Neg Hx   . Colon cancer Neg Hx   . Pancreatic  cancer Neg Hx   . Stomach cancer Neg Hx     Social History   Socioeconomic History  . Marital status: Divorced    Spouse name: Not on file  . Number of children: 3  . Years of education: 17yrs  . Highest education level: Bachelor's degree (e.g., BA, AB, BS)  Occupational History  . Occupation: retired    Associate Professor: RETIRED  Tobacco Use  . Smoking status: Former Smoker    Packs/day: 2.00    Years: 20.00    Pack years: 40.00    Types: Cigarettes    Quit date: 07/19/1977    Years since quitting: 43.1  . Smokeless tobacco: Never Used  Vaping Use  . Vaping Use: Never used  Substance and Sexual Activity  . Alcohol use: Yes    Alcohol/week: 7.0 standard drinks    Types: 7 Glasses of wine per week    Comment: dinner wine daily  . Drug use: No  . Sexual activity: Never  Other  Topics Concern  . Not on file  Social History Narrative   Pt lives at home alone.    Caffeine Use: very little   Heart healthy diet, is planning exercise      Retired from Patent examiner   Left handed   Social Determinants of Health   Financial Resource Strain: Not on file  Food Insecurity: Not on file  Transportation Needs: Not on file  Physical Activity: Not on file  Stress: Not on file  Social Connections: Not on file  Intimate Partner Violence: Not on file    Outpatient Medications Prior to Visit  Medication Sig Dispense Refill  . acetaminophen (TYLENOL) 500 MG tablet Take 1,000 mg by mouth as needed for moderate pain or headache.     [name] aspirin EC 81 MG tablet Take 81 mg by mouth every evening.     . chlorthalidone (HYGROTON) 25 MG tablet TAKE 1 TABLET BY MOUTH IN  THE MORNING 90 tablet 3  . levothyroxine (SYNTHROID) 88 MCG tablet TAKE 1 TABLET BY MOUTH  DAILY BEFORE BREAKFAST 90 tablet 3  . lubiprostone (AMITIZA) 8 MCG capsule Take 1 capsule (8 mcg total) by mouth 2 (two) times daily with a meal. D/c linzess 180 capsule 3  . metoprolol succinate (TOPROL XL) 25 MG 24 hr tablet Take 0.5 tablets (12.5 mg total) by mouth daily. 90 tablet 1  . pantoprazole (PROTONIX) 40 MG tablet TAKE 1 TABLET BY MOUTH  DAILY 90 tablet 3  . ramipril (ALTACE) 10 MG capsule TAKE 1 CAPSULE BY MOUTH  TWICE DAILY 180 capsule 3  . rivaroxaban (XARELTO) 20 MG TABS tablet Take 1 tablet (20 mg total) by mouth daily with supper. 90 tablet 1  . rosuvastatin (CRESTOR) 40 MG tablet TAKE 1 TABLET BY MOUTH IN  THE EVENING 90 tablet 3  . sodium chloride (OCEAN) 0.65 % SOLN nasal spray Place 1 spray into both nostrils as needed for congestion.     No facility-administered medications prior to visit.    No Known Allergies  Review of Systems  Constitutional: Negative for fever and malaise/fatigue.  HENT: Negative for congestion.   Eyes: Negative for blurred vision.  Respiratory: Negative for shortness of  breath.   Cardiovascular: Negative for chest pain, palpitations and leg swelling.  Gastrointestinal: Negative for abdominal pain, blood in stool and nausea.  Genitourinary: Negative for dysuria and frequency.  Musculoskeletal: Negative for falls.  Skin: Negative for rash.  Neurological: Negative for dizziness, loss of consciousness and  headaches.  Endo/Heme/Allergies: Negative for environmental allergies.  Psychiatric/Behavioral: Negative for depression. The patient is not nervous/anxious.        Objective:    Physical Exam Vitals and nursing note reviewed.  Constitutional:      General: He is not in acute distress.    Appearance: He is well-developed and well-nourished.  HENT:     Head: Normocephalic and atraumatic.     Nose: Nose normal.  Eyes:     General:        Right eye: No discharge.        Left eye: No discharge.  Cardiovascular:     Rate and Rhythm: Normal rate.     Heart sounds: No murmur heard.   Pulmonary:     Effort: Pulmonary effort is normal.     Breath sounds: Normal breath sounds.  Abdominal:     General: Bowel sounds are normal.     Palpations: Abdomen is soft.     Tenderness: There is no abdominal tenderness.  Musculoskeletal:        General: No edema.     Cervical back: Normal range of motion and neck supple.  Skin:    General: Skin is warm and dry.  Neurological:     Mental Status: He is alert and oriented to person, place, and time.  Psychiatric:        Mood and Affect: Mood and affect normal.     BP 114/62   Pulse 74   Temp 98.2 F (36.8 C) (Oral)   Resp 16   Wt 210 lb (95.3 kg)   SpO2 97%   BMI 26.96 kg/m  Wt Readings from Last 3 Encounters:  08/13/20 210 lb (95.3 kg)  07/30/20 212 lb 1.9 oz (96.2 kg)  06/07/20 218 lb (98.9 kg)    Diabetic Foot Exam - Simple   No data filed    Lab Results  Component Value Date   WBC 6.8 08/07/2020   HGB 13.3 08/07/2020   HCT 40.0 08/07/2020   PLT 53.0 (L) 08/07/2020   GLUCOSE 86  08/07/2020   CHOL 140 07/05/2020   TRIG 68.0 07/05/2020   HDL 53.80 07/05/2020   LDLCALC 72 07/05/2020   ALT 13 08/07/2020   AST 13 08/07/2020   NA 140 08/07/2020   K 4.1 08/07/2020   CL 103 08/07/2020   CREATININE 1.12 08/07/2020   BUN 19 08/07/2020   CO2 32 08/07/2020   TSH 1.46 07/05/2020   PSA 0.39 05/08/2014   INR 0.99 02/07/2013   HGBA1C 5.7 07/05/2020   MICROALBUR 2.0 (H) 12/03/2015    Lab Results  Component Value Date   TSH 1.46 07/05/2020   Lab Results  Component Value Date   WBC 6.8 08/07/2020   HGB 13.3 08/07/2020   HCT 40.0 08/07/2020   MCV 93.5 08/07/2020   PLT 53.0 (L) 08/07/2020   Lab Results  Component Value Date   NA 140 08/07/2020   K 4.1 08/07/2020   CO2 32 08/07/2020   GLUCOSE 86 08/07/2020   BUN 19 08/07/2020   CREATININE 1.12 08/07/2020   BILITOT 0.7 08/07/2020   ALKPHOS 55 08/07/2020   AST 13 08/07/2020   ALT 13 08/07/2020   PROT 6.7 08/07/2020   ALBUMIN 4.5 08/07/2020   CALCIUM 9.7 08/07/2020   ANIONGAP 10 07/30/2020   GFR 60.73 08/07/2020   Lab Results  Component Value Date   CHOL 140 07/05/2020   Lab Results  Component Value Date   HDL 53.80  07/05/2020   Lab Results  Component Value Date   LDLCALC 72 07/05/2020   Lab Results  Component Value Date   TRIG 68.0 07/05/2020   Lab Results  Component Value Date   CHOLHDL 3 07/05/2020   Lab Results  Component Value Date   HGBA1C 5.7 07/05/2020       Assessment & Plan:   Problem List Items Addressed This Visit    Hypothyroid (Chronic)    On Levothyroxine, continue to monitor      Mixed hyperlipidemia    Tolerating statin, encouraged heart healthy diet, avoid trans fats, minimize simple carbs and saturated fats. Increase exercise as tolerated      Relevant Orders   Lipid panel   Essential hypertension, benign    Well controlled, no changes to meds. Encouraged heart healthy diet such as the DASH diet and exercise as tolerated.       Relevant Orders   TSH    Thrombocytopenia ([place]) - Primary    Worsened with recent draw. He is following with hematology they will repeat cbc next week. He is asymptomatic. He stopped drinking all alcohol a week ago and is aware that avoiding alcohol will help his platelets.       Relevant Orders   CBC w/Diff   CBC with Differential/Platelet   Hyperglycemia    hgba1c acceptable, minimize simple carbs. Increase exercise as tolerated.      Relevant Orders   Hemoglobin A1c   Comprehensive metabolic panel   Persistent atrial fibrillation ([place])    Doing well. Follows with cardiology.       Feeling grief    His exwife with whom he stayed very close has just passed away. His daughters and he are getting through the grief together. He will let us know if he needs any support         I am having Tanner [name]. Tanner "Phil" maintain his aspirin EC, acetaminophen, sodium chloride, levothyroxine, ramipril, metoprolol succinate, rosuvastatin, pantoprazole, lubiprostone, chlorthalidone, and rivaroxaban.  No orders of the defined types were placed in this encounter.    Penni Homans, MD

## 2020-08-14 NOTE — Assessment & Plan Note (Signed)
Doing well. Follows with cardiology.

## 2020-08-14 NOTE — Assessment & Plan Note (Signed)
Worsened with recent draw. He is following with hematology they will repeat cbc next week. He is asymptomatic. He stopped drinking all alcohol a week ago and is aware that avoiding alcohol will help his platelets.

## 2020-08-14 NOTE — Assessment & Plan Note (Signed)
hgba1c acceptable, minimize simple carbs. Increase exercise as tolerated.  

## 2020-08-14 NOTE — Assessment & Plan Note (Signed)
His exwife with whom he stayed very close has just passed away. His daughters and he are getting through the grief together. He will let us know if he needs any support

## 2020-08-20 ENCOUNTER — Other Ambulatory Visit: Payer: Self-pay | Admitting: *Deleted

## 2020-08-20 DIAGNOSIS — D696 Thrombocytopenia, unspecified: Secondary | ICD-10-CM

## 2020-08-21 ENCOUNTER — Other Ambulatory Visit: Payer: Self-pay

## 2020-08-21 ENCOUNTER — Inpatient Hospital Stay: Payer: Medicare Other | Attending: Family

## 2020-08-21 DIAGNOSIS — D649 Anemia, unspecified: Secondary | ICD-10-CM | POA: Diagnosis not present

## 2020-08-21 DIAGNOSIS — D696 Thrombocytopenia, unspecified: Secondary | ICD-10-CM

## 2020-08-21 LAB — CBC WITH DIFFERENTIAL (CANCER CENTER ONLY)
Abs Immature Granulocytes: 0.01 10*3/uL (ref 0.00–0.07)
Basophils Absolute: 0.1 10*3/uL (ref 0.0–0.1)
Basophils Relative: 1 %
Eosinophils Absolute: 0.3 10*3/uL (ref 0.0–0.5)
Eosinophils Relative: 5 %
HCT: 40.6 % (ref 39.0–52.0)
Hemoglobin: 13.8 g/dL (ref 13.0–17.0)
Immature Granulocytes: 0 %
Lymphocytes Relative: 28 %
Lymphs Abs: 1.9 10*3/uL (ref 0.7–4.0)
MCH: 31.5 pg (ref 26.0–34.0)
MCHC: 34 g/dL (ref 30.0–36.0)
MCV: 92.7 fL (ref 80.0–100.0)
Monocytes Absolute: 0.8 10*3/uL (ref 0.1–1.0)
Monocytes Relative: 12 %
Neutro Abs: 3.7 10*3/uL (ref 1.7–7.7)
Neutrophils Relative %: 54 %
Platelet Count: 164 10*3/uL (ref 150–400)
RBC: 4.38 MIL/uL (ref 4.22–5.81)
RDW: 13.2 % (ref 11.5–15.5)
WBC Count: 6.7 10*3/uL (ref 4.0–10.5)
nRBC: 0 % (ref 0.0–0.2)

## 2020-08-21 LAB — CMP (CANCER CENTER ONLY)
ALT: 14 U/L (ref 0–44)
AST: 14 U/L — ABNORMAL LOW (ref 15–41)
Albumin: 4.5 g/dL (ref 3.5–5.0)
Alkaline Phosphatase: 54 U/L (ref 38–126)
Anion gap: 7 (ref 5–15)
BUN: 22 mg/dL (ref 8–23)
CO2: 32 mmol/L (ref 22–32)
Calcium: 10.3 mg/dL (ref 8.9–10.3)
Chloride: 100 mmol/L (ref 98–111)
Creatinine: 1.27 mg/dL — ABNORMAL HIGH (ref 0.61–1.24)
GFR, Estimated: 56 mL/min — ABNORMAL LOW (ref >60)
Glucose, Bld: 101 mg/dL — ABNORMAL HIGH (ref 70–99)
Potassium: 4.3 mmol/L (ref 3.5–5.1)
Sodium: 139 mmol/L (ref 135–145)
Total Bilirubin: 0.7 mg/dL (ref 0.3–1.2)
Total Protein: 7 g/dL (ref 6.5–8.1)

## 2020-08-21 LAB — SAVE SMEAR(SSMR), FOR PROVIDER SLIDE REVIEW

## 2020-08-23 ENCOUNTER — Ambulatory Visit (HOSPITAL_BASED_OUTPATIENT_CLINIC_OR_DEPARTMENT_OTHER)
Admission: RE | Admit: 2020-08-23 | Discharge: 2020-08-23 | Disposition: A | Discharge status: Home / Self Care | Payer: Medicare Other | Source: Ambulatory Visit | Attending: Family Medicine | Admitting: Family Medicine

## 2020-08-23 ENCOUNTER — Other Ambulatory Visit: Payer: Self-pay

## 2020-08-23 DIAGNOSIS — Z789 Other specified health status: Secondary | ICD-10-CM

## 2020-08-23 DIAGNOSIS — Z7289 Other problems related to lifestyle: Secondary | ICD-10-CM | POA: Diagnosis present

## 2020-08-23 DIAGNOSIS — R109 Unspecified abdominal pain: Secondary | ICD-10-CM | POA: Diagnosis present

## 2020-08-23 DIAGNOSIS — K5900 Constipation, unspecified: Secondary | ICD-10-CM | POA: Diagnosis present

## 2020-08-23 DIAGNOSIS — D696 Thrombocytopenia, unspecified: Secondary | ICD-10-CM | POA: Insufficient documentation

## 2020-08-26 ENCOUNTER — Telehealth: Payer: Self-pay | Admitting: Family Medicine

## 2020-08-26 NOTE — Telephone Encounter (Signed)
Notiry patients his last set of labs were OK, unchanged. Could not see the peripheral smear, no resulted yet. The labs were ordered by oncology so can discuss further with them.

## 2020-08-26 NOTE — Telephone Encounter (Signed)
Patient states someone called  Him in reference to his lab work.  Patient is requesting a call back

## 2020-08-26 NOTE — Telephone Encounter (Signed)
Pt called in wanting to go over lab work. Could you please lab results from 08/21/20 and let me know what to tell him?

## 2020-08-27 NOTE — Telephone Encounter (Signed)
Spoke with patient and he has been scheduled for his follow appointment in 3 months.

## 2020-08-27 NOTE — Telephone Encounter (Signed)
He does not need to recheck his platelets with me since they were normal with hematology

## 2020-08-27 NOTE — Telephone Encounter (Signed)
Patient just wanted to know if he needs to have another blood test prior (cbc?) to the test that you ordered for end of feb.  Im thinking that you and him was discussing something about his platelets.  They were done at oncology and were normal range.

## 2020-09-25 ENCOUNTER — Ambulatory Visit: Payer: Medicare Other | Admitting: Neurology

## 2020-10-16 NOTE — Progress Notes (Signed)
[place] Phone: [phone] Subjective:   Fontaine No, am serving as a scribe for Dr. Hulan Saas. This visit occurred during the SARS-CoV-2 public health emergency.  Safety protocols were in place, including screening questions prior to the visit, additional usage of staff PPE, and extensive cleaning of exam room while observing appropriate contact time as indicated for disinfecting solutions.   I'm seeing this patient by the request  of:  [name], MD  CC: Right toe pain  Tanner [name]  DONEVAN BILLER is a 86 y.o. male coming in with complaint of R toe pain. Patient states that he tripped one month ago when he tripped on a bed sheet while making the bed. Patient woke up this past weekend and the toe pain returned. Was able to mow lawn on Tuesday without pain. Woke up today with redness and swelling in toe.  Patient states last night having pizza with some meat on it.  Denies any alcohol use.    Past Medical History:  Diagnosis Date  . Allergy    seasonal- mostly spring  . Anemia 02/03/2011  . ASTHMA, CHILDHOOD 03/17/2010   mostly as child  . BCC (basal cell carcinoma) 12/08/2011  . CAROTID ARTERY OCCLUSION, WITH INFARCTION 03/17/2010  . CEREBROVASCULAR ACCIDENT 03/17/2010   partial loss of peripheral vision on left-"slight improved"  . CHICKENPOX, HX OF 03/17/2010  . Constipation 12/08/2014  . Epistaxis 09/06/2013  . FATIGUE 03/17/2010  . Hearing loss 11/04/2010   bilateral  . HYPERKALEMIA 05/27/2010  . Hyperlipidemia   . Hypertension   . Hypothyroid 12/08/2011  . Mixed hyperlipidemia 03/17/2010  . Oral lesion 11/20/2014  . Overweight(278.02) 07/01/2010  . Peripheral neuropathy 08/18/2016  . Personal history of other infectious and parasitic disease 03/17/2010  . Preventative health care 08/23/2016  . Skin lesion of right arm 11/20/2014  . Sleep apnea    uses mouthpiece only  . Thrombocytopenia ([place])  04/05/2012  . TOBACCO ABUSE, HX OF 03/17/2010  . Tubular adenoma of colon 06/10/2011   Past Surgical History:  Procedure Laterality Date  . CARDIOVERSION N/A 08/11/2019   Procedure: CARDIOVERSION;  Surgeon: Josue Hector, MD;  Location: [facility] ENDOSCOPY;  Service: Cardiovascular;  Laterality: N/A;  . CAROTID ENDARTERECTOMY Left February 13, 2013   cea  . CATARACT EXTRACTION, BILATERAL Right    Cataract, and stigmatism  . COLONOSCOPY  Dec. 2014  . ENDARTERECTOMY Left 02/13/2013   Procedure: ENDARTERECTOMY CAROTID;  Surgeon: Rosetta Posner, MD;  Location: [facility];  Service: Vascular;  Laterality: Left;  . EUS N/A 08/17/2013   Procedure: UPPER ENDOSCOPIC ULTRASOUND (EUS) LINEAR;  Surgeon: Milus Banister, MD;  Location: [facility] ENDOSCOPY;  Service: Endoscopy;  Laterality: N/A;  . knee cartliage repair  1964, 1990   bilateral  . [place]   removed  . ROTATOR CUFF REPAIR     right  . TONSILLECTOMY    . UPPER GI ENDOSCOPY  Jan. 2015   Social History   Socioeconomic History  . Marital status: Divorced    Spouse name: Not on file  . Number of children: 3  . Years of education: 75yrs  . Highest education level: Bachelor's degree (e.g., BA, AB, BS)  Occupational History  . Occupation: retired    Fish farm manager: RETIRED  Tobacco Use  . Smoking status: Former Smoker    Packs/day: 2.00    Years: 20.00    Pack years: 40.00  Types: Cigarettes    Quit date: 07/19/1977    Years since quitting: 43.2  . Smokeless tobacco: Never Used  Vaping Use  . Vaping Use: Never used  Substance and Sexual Activity  . Alcohol use: Yes    Alcohol/week: 7.0 standard drinks    Types: 7 Glasses of wine per week    Comment: dinner wine daily  . Drug use: No  . Sexual activity: Never  Other Topics Concern  . Not on file  Social History Narrative   Pt lives at home alone.    Caffeine Use: very little   Heart healthy diet, is planning exercise      Retired from Event organiser   Left handed    Social Determinants of Health   Financial Resource Strain: Not on file  Food Insecurity: Not on file  Transportation Needs: Not on file  Physical Activity: Not on file  Stress: Not on file  Social Connections: Not on file   No Known Allergies Family History  Problem Relation Age of Onset  . Alzheimer's disease Mother   . Emphysema Sister        cigarettes  . Cancer Sister        lung, tobacco   . Heart disease Son   . Coronary artery disease Brother   . Heart attack Brother   . Other Brother        heart problems- from fathers side  . Cancer Brother 67       lung? smoker  . Heart disease Brother 45       Heart disease before age 93  . Cancer Sister        gyn  . Heart disease Sister   . Bipolar disorder Daughter   . Obesity Daughter   . Stroke Father   . Heart disease Sister   . Esophageal cancer Neg Hx   . Colon cancer Neg Hx   . Pancreatic cancer Neg Hx   . Stomach cancer Neg Hx     Current Outpatient Medications (Endocrine & Metabolic):  .  levothyroxine (SYNTHROID) 88 MCG tablet, TAKE 1 TABLET BY MOUTH  DAILY BEFORE BREAKFAST  Current Outpatient Medications (Cardiovascular):  .  chlorthalidone (HYGROTON) 25 MG tablet, TAKE 1 TABLET BY MOUTH IN  THE MORNING .  metoprolol succinate (TOPROL XL) 25 MG 24 hr tablet, Take 0.5 tablets (12.5 mg total) by mouth daily. .  ramipril (ALTACE) 10 MG capsule, TAKE 1 CAPSULE BY MOUTH  TWICE DAILY .  rosuvastatin (CRESTOR) 40 MG tablet, TAKE 1 TABLET BY MOUTH IN  THE EVENING  Current Outpatient Medications (Respiratory):  .  sodium chloride (OCEAN) 0.65 % SOLN nasal spray, Place 1 spray into both nostrils as needed for congestion.  Current Outpatient Medications (Analgesics):  .  acetaminophen (TYLENOL) 500 MG tablet, Take 1,000 mg by mouth as needed for moderate pain or headache.  [name]  aspirin EC 81 MG tablet, Take 81 mg by mouth every evening.   Current Outpatient Medications (Hematological):  .  rivaroxaban (XARELTO)  20 MG TABS tablet, Take 1 tablet (20 mg total) by mouth daily with supper.  Current Outpatient Medications (Other):  .  lubiprostone (AMITIZA) 8 MCG capsule, Take 1 capsule (8 mcg total) by mouth 2 (two) times daily with a meal. D/c linzess .  pantoprazole (PROTONIX) 40 MG tablet, TAKE 1 TABLET BY MOUTH  DAILY   Reviewed prior external information including notes and imaging from  primary care provider As well as notes that  were available from care everywhere and other healthcare systems.  Past medical history, social, surgical and family history all reviewed in electronic medical record.  No pertanent information unless stated regarding to the chief complaint.   Review of Systems:  No headache, visual changes, nausea, vomiting, diarrhea, constipation, dizziness, abdominal pain, skin rash, fevers, chills, night sweats, weight loss, swollen lymph nodes, body aches, , chest pain, shortness of breath, mood changes. POSITIVE muscle aches, joint swelling  Objective  Blood pressure (!) 142/72, pulse 62, height 6\' 2"  (1.88 m), weight 210 lb (95.3 kg), SpO2 99 %.   General: No apparent distress alert and oriented x3 mood and affect normal, dressed appropriately.  HEENT: Pupils equal, extraocular movements intact  Respiratory: Patient's speak in full sentences and does not appear short of breath  Cardiovascular: No lower extremity edema, non tender, no erythema  Gait .  Antalgic favoring the right foot MSK: Right large toe does have some erythema noted of the first metatarsal.  Patient does have limited range of motion especially with extension of the toe.  Tenderness to palpation only in this area.  Good range of motion of the ankle.  Dorsalis pedis pulse noted.  Good capillary refill of the nailbed distal to the area of swelling.  Limited musculoskeletal ultrasound was performed and interpreted by Lyndal Pulley  Limited ultrasound shows patient does have a large effusion noted of the first  metatarsal.  Mild arthritic changes noted.  Patient does have a cortical irregularity just distal to the metatarsal area.  Mild cortical irregularity that seems to be just proximal on the plantar aspect of the foot.  Increase neovascularization in the area. Impression: Synovitis, questionable gout with questionable vertical fracture noted but nondisplaced.    Impression and Recommendations:     The above documentation has been reviewed and is accurate and complete Lyndal Pulley, DO

## 2020-10-17 ENCOUNTER — Other Ambulatory Visit: Payer: Self-pay

## 2020-10-17 ENCOUNTER — Encounter: Payer: Self-pay | Admitting: Family Medicine

## 2020-10-17 ENCOUNTER — Ambulatory Visit (INDEPENDENT_AMBULATORY_CARE_PROVIDER_SITE_OTHER)
Admission: RE | Admit: 2020-10-17 | Discharge: 2020-10-17 | Disposition: A | Discharge status: Home / Self Care | Payer: Medicare Other | Source: Ambulatory Visit | Attending: Family Medicine | Admitting: Family Medicine

## 2020-10-17 ENCOUNTER — Ambulatory Visit: Payer: Self-pay

## 2020-10-17 ENCOUNTER — Ambulatory Visit: Payer: Medicare Other | Admitting: Family Medicine

## 2020-10-17 VITALS — BP 142/72 | HR 62 | Ht 74.0 in | Wt 210.0 lb

## 2020-10-17 DIAGNOSIS — M79674 Pain in right toe(s): Secondary | ICD-10-CM

## 2020-10-17 DIAGNOSIS — M659 Synovitis and tenosynovitis, unspecified: Secondary | ICD-10-CM | POA: Insufficient documentation

## 2020-10-17 DIAGNOSIS — M2550 Pain in unspecified joint: Secondary | ICD-10-CM

## 2020-10-17 NOTE — Patient Instructions (Addendum)
Xray at [facility] today [facility] [place] 2x a day Tart cherry extract 1200mg  at night Post-op shoe or carbon fiber plate [place]  See me in 2-3 weeks

## 2020-10-17 NOTE — Assessment & Plan Note (Signed)
Patient does have what appears to be more of a synovitis of the toe.  There is a cortical irregularity and do need to rule out fracture of first.  We will get an x-ray but unfortunately down at our office and has been referred to our outside location for the x-ray.  Patient will have this done and if it does not show a fracture we could consider potential injection and follow-up.  Questionable gout and will add uric acid to patient's next lab draw with patient's creatinine being minorly elevated with last blood draw.  Patient is to increase activity slowly.  Patient is going to do a carbon fiber plate and did not want the postop shoe.  Follow-up with me again 2 to 3 weeks

## 2020-10-24 ENCOUNTER — Other Ambulatory Visit: Payer: Self-pay

## 2020-10-24 ENCOUNTER — Ambulatory Visit (INDEPENDENT_AMBULATORY_CARE_PROVIDER_SITE_OTHER): Payer: Medicare Other

## 2020-10-24 DIAGNOSIS — R739 Hyperglycemia, unspecified: Secondary | ICD-10-CM

## 2020-10-24 DIAGNOSIS — D696 Thrombocytopenia, unspecified: Secondary | ICD-10-CM

## 2020-10-24 DIAGNOSIS — M2550 Pain in unspecified joint: Secondary | ICD-10-CM

## 2020-10-24 DIAGNOSIS — I10 Essential (primary) hypertension: Secondary | ICD-10-CM | POA: Diagnosis not present

## 2020-10-24 DIAGNOSIS — E782 Mixed hyperlipidemia: Secondary | ICD-10-CM

## 2020-10-24 LAB — COMPREHENSIVE METABOLIC PANEL
ALT: 13 U/L (ref 0–53)
ALT: 13 U/L (ref 0–53)
AST: 13 U/L (ref 0–37)
AST: 14 U/L (ref 0–37)
Albumin: 4.2 g/dL (ref 3.5–5.2)
Albumin: 4.3 g/dL (ref 3.5–5.2)
Alkaline Phosphatase: 59 U/L (ref 39–117)
Alkaline Phosphatase: 61 U/L (ref 39–117)
BUN: 21 mg/dL (ref 6–23)
BUN: 21 mg/dL (ref 6–23)
CO2: 32 mEq/L (ref 19–32)
CO2: 32 mEq/L (ref 19–32)
Calcium: 9.7 mg/dL (ref 8.4–10.5)
Calcium: 9.7 mg/dL (ref 8.4–10.5)
Chloride: 102 mEq/L (ref 96–112)
Chloride: 102 mEq/L (ref 96–112)
Creatinine, Ser: 1.19 mg/dL (ref 0.40–1.50)
Creatinine, Ser: 1.19 mg/dL (ref 0.40–1.50)
GFR: 56.38 mL/min — ABNORMAL LOW (ref >60.00)
GFR: 56.38 mL/min — ABNORMAL LOW (ref >60.00)
Glucose, Bld: 88 mg/dL (ref 70–99)
Glucose, Bld: 89 mg/dL (ref 70–99)
Potassium: 4.4 mEq/L (ref 3.5–5.1)
Potassium: 4.5 mEq/L (ref 3.5–5.1)
Sodium: 140 mEq/L (ref 135–145)
Sodium: 141 mEq/L (ref 135–145)
Total Bilirubin: 0.6 mg/dL (ref 0.2–1.2)
Total Bilirubin: 0.6 mg/dL (ref 0.2–1.2)
Total Protein: 6.6 g/dL (ref 6.0–8.3)
Total Protein: 6.7 g/dL (ref 6.0–8.3)

## 2020-10-24 LAB — VITAMIN D 25 HYDROXY (VIT D DEFICIENCY, FRACTURES): VITD: 36.31 ng/mL (ref 30.00–100.00)

## 2020-10-24 LAB — LIPID PANEL
Cholesterol: 141 mg/dL (ref 0–200)
HDL: 44.9 mg/dL (ref >39.00)
LDL Cholesterol: 74 mg/dL (ref 0–99)
NonHDL: 96.04
Total CHOL/HDL Ratio: 3
Triglycerides: 112 mg/dL (ref 0.0–149.0)
VLDL: 22.4 mg/dL (ref 0.0–40.0)

## 2020-10-24 LAB — SEDIMENTATION RATE: Sed Rate: 12 mm/hr (ref 0–20)

## 2020-10-24 LAB — HEMOGLOBIN A1C: Hgb A1c MFr Bld: 5.9 % (ref 4.6–6.5)

## 2020-10-24 LAB — TSH: TSH: 2.26 u[IU]/mL (ref 0.35–4.50)

## 2020-10-24 LAB — URIC ACID: Uric Acid, Serum: 5.9 mg/dL (ref 4.0–7.8)

## 2020-10-25 ENCOUNTER — Ambulatory Visit (INDEPENDENT_AMBULATORY_CARE_PROVIDER_SITE_OTHER): Payer: Medicare Other

## 2020-10-25 DIAGNOSIS — Z0000 Encounter for general adult medical examination without abnormal findings: Secondary | ICD-10-CM

## 2020-10-25 LAB — CBC WITH DIFFERENTIAL/PLATELET
Basophils Absolute: 0.1 10*3/uL (ref 0.0–0.1)
Basophils Relative: 0.8 % (ref 0.0–3.0)
Eosinophils Absolute: 0.3 10*3/uL (ref 0.0–0.7)
Eosinophils Relative: 4.4 % (ref 0.0–5.0)
HCT: 39.8 % (ref 39.0–52.0)
Hemoglobin: 13.5 g/dL (ref 13.0–17.0)
Lymphocytes Relative: 28.7 % (ref 12.0–46.0)
Lymphs Abs: 1.9 10*3/uL (ref 0.7–4.0)
MCHC: 33.8 g/dL (ref 30.0–36.0)
MCV: 92.7 fl (ref 78.0–100.0)
Monocytes Absolute: 0.6 10*3/uL (ref 0.1–1.0)
Monocytes Relative: 9.4 % (ref 3.0–12.0)
Neutro Abs: 3.8 10*3/uL (ref 1.4–7.7)
Neutrophils Relative %: 56.7 % (ref 43.0–77.0)
Platelets: 166 10*3/uL (ref 150.0–400.0)
RBC: 4.3 Mil/uL (ref 4.22–5.81)
RDW: 14.7 % (ref 11.5–15.5)
WBC: 6.3 10*3/uL (ref 4.0–10.5)

## 2020-10-25 NOTE — Progress Notes (Signed)
Pt came today to have cbc redrawn. [facility] stated the plt clumped and needed redraw.

## 2020-10-29 ENCOUNTER — Ambulatory Visit: Payer: Medicare Other | Admitting: Family Medicine

## 2020-11-07 NOTE — Progress Notes (Signed)
[place] [address] [place] Phone: [phone] Subjective:   I Kandace Blitz am serving as a Education administrator for Dr. Hulan Saas.  This visit occurred during the SARS-CoV-2 public health emergency.  Safety protocols were in place, including screening questions prior to the visit, additional usage of staff PPE, and extensive cleaning of exam room while observing appropriate contact time as indicated for disinfecting solutions.   I'm seeing this patient by the request  of:  [name], MD  CC: Right toe pain follow-up  [id number]   10/18/2019 Patient does have what appears to be more of a synovitis of the toe.  There is a cortical irregularity and do need to rule out fracture of first.  We will get an x-ray but unfortunately down at our office and has been referred to our outside location for the x-ray.  Patient will have this done and if it does not show a fracture we could consider potential injection and follow-up.  Questionable gout and will add uric acid to patient's next lab draw with patient's creatinine being minorly elevated with last blood draw.  Patient is to increase activity slowly.  Patient is going to do a carbon fiber plate and did not want the postop shoe.  Follow-up with me again 2 to 3 weeks  Update 11/08/2020 TYRIN HERBERS is a 86 y.o. male coming in with complaint of R great toe pain. Patient states the toe feels good at the moment. Still having issues depending on what he is doing.  Patient states that he is having more right knee pain.  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  Feels like this is contributing to more of the discomfort and pain at the moment.  Patient denies any significant no instability just more pain that is affecting even daily activities.     Past Medical History:  Diagnosis Date  . Allergy    seasonal- mostly spring  . Anemia 02/03/2011  .  ASTHMA, CHILDHOOD 03/17/2010   mostly as child  . BCC (basal cell carcinoma) 12/08/2011  . CAROTID ARTERY OCCLUSION, WITH INFARCTION 03/17/2010  . CEREBROVASCULAR ACCIDENT 03/17/2010   partial loss of peripheral vision on left-"slight improved"  . CHICKENPOX, HX OF 03/17/2010  . Constipation 12/08/2014  . Epistaxis 09/06/2013  . FATIGUE 03/17/2010  . Hearing loss 11/04/2010   bilateral  . HYPERKALEMIA 05/27/2010  . Hyperlipidemia   . Hypertension   . Hypothyroid 12/08/2011  . Mixed hyperlipidemia 03/17/2010  . Oral lesion 11/20/2014  . Overweight(278.02) 07/01/2010  . Peripheral neuropathy 08/18/2016  . Personal history of other infectious and parasitic disease 03/17/2010  . Preventative health care 08/23/2016  . Skin lesion of right arm 11/20/2014  . Sleep apnea    uses mouthpiece only  . Thrombocytopenia ([place]) 04/05/2012  . TOBACCO ABUSE, HX OF 03/17/2010  . Tubular adenoma of colon 06/10/2011   Past Surgical History:  Procedure Laterality Date  . CARDIOVERSION N/A 08/11/2019   Procedure: CARDIOVERSION;  Surgeon: Josue Hector, MD;  Location: [facility] ENDOSCOPY;  Service: Cardiovascular;  Laterality: N/A;  . CAROTID ENDARTERECTOMY Left February 13, 2013   cea  . CATARACT EXTRACTION, BILATERAL Right    Cataract, and stigmatism  . COLONOSCOPY  Dec. 2014  . ENDARTERECTOMY Left 02/13/2013   Procedure: ENDARTERECTOMY CAROTID;  Surgeon: Rosetta Posner, MD;  Location: [facility];  Service: Vascular;  Laterality: Left;  . EUS N/A 08/17/2013  Procedure: UPPER ENDOSCOPIC ULTRASOUND (EUS) LINEAR;  Surgeon: Milus Banister, MD;  Location: [facility] ENDOSCOPY;  Service: Endoscopy;  Laterality: N/A;  . knee cartliage repair  1964, 1990   bilateral  . [place]   removed  . ROTATOR CUFF REPAIR     right  . TONSILLECTOMY    . UPPER GI ENDOSCOPY  Jan. 2015   Social History   Socioeconomic History  . Marital status: Divorced    Spouse name: Not on file  . Number of children: 3  . Years of education:  71yrs  . Highest education level: Bachelor's degree (e.g., BA, AB, BS)  Occupational History  . Occupation: retired    Fish farm manager: RETIRED  Tobacco Use  . Smoking status: Former Smoker    Packs/day: 2.00    Years: 20.00    Pack years: 40.00    Types: Cigarettes    Quit date: 07/19/1977    Years since quitting: 43.3  . Smokeless tobacco: Never Used  Vaping Use  . Vaping Use: Never used  Substance and Sexual Activity  . Alcohol use: Yes    Alcohol/week: 7.0 standard drinks    Types: 7 Glasses of wine per week    Comment: dinner wine daily  . Drug use: No  . Sexual activity: Never  Other Topics Concern  . Not on file  Social History Narrative   Pt lives at home alone.    Caffeine Use: very little   Heart healthy diet, is planning exercise      Retired from Event organiser   Left handed   Social Determinants of Health   Financial Resource Strain: Not on file  Food Insecurity: Not on file  Transportation Needs: Not on file  Physical Activity: Not on file  Stress: Not on file  Social Connections: Not on file   No Known Allergies Family History  Problem Relation Age of Onset  . Alzheimer's disease Mother   . Emphysema Sister        cigarettes  . Cancer Sister        lung, tobacco   . Heart disease Son   . Coronary artery disease Brother   . Heart attack Brother   . Other Brother        heart problems- from fathers side  . Cancer Brother 3       lung? smoker  . Heart disease Brother 57       Heart disease before age 69  . Cancer Sister        gyn  . Heart disease Sister   . Bipolar disorder Daughter   . Obesity Daughter   . Stroke Father   . Heart disease Sister   . Esophageal cancer Neg Hx   . Colon cancer Neg Hx   . Pancreatic cancer Neg Hx   . Stomach cancer Neg Hx     Current Outpatient Medications (Endocrine & Metabolic):  .  levothyroxine (SYNTHROID) 88 MCG tablet, TAKE 1 TABLET BY MOUTH  DAILY BEFORE BREAKFAST  Current Outpatient Medications  (Cardiovascular):  .  chlorthalidone (HYGROTON) 25 MG tablet, TAKE 1 TABLET BY MOUTH IN  THE MORNING .  metoprolol succinate (TOPROL XL) 25 MG 24 hr tablet, Take 0.5 tablets (12.5 mg total) by mouth daily. .  ramipril (ALTACE) 10 MG capsule, TAKE 1 CAPSULE BY MOUTH  TWICE DAILY .  rosuvastatin (CRESTOR) 40 MG tablet, TAKE 1 TABLET BY MOUTH IN  THE EVENING   Current Outpatient Medications (Analgesics):  .  acetaminophen (TYLENOL) 500 MG tablet, Take 1,000 mg by mouth as needed for moderate pain or headache.  [name]  aspirin EC 81 MG tablet, Take 81 mg by mouth every evening.   Current Outpatient Medications (Hematological):  .  rivaroxaban (XARELTO) 20 MG TABS tablet, Take 1 tablet (20 mg total) by mouth daily with supper.  Current Outpatient Medications (Other):  .  lubiprostone (AMITIZA) 8 MCG capsule, Take 1 capsule (8 mcg total) by mouth 2 (two) times daily with a meal. D/c linzess .  pantoprazole (PROTONIX) 40 MG tablet, TAKE 1 TABLET BY MOUTH  DAILY   Reviewed prior external information including notes and imaging from  primary care provider As well as notes that were available from care everywhere and other healthcare systems.  Past medical history, social, surgical and family history all reviewed in electronic medical record.  No pertanent information unless stated regarding to the chief complaint.   Review of Systems:  No headache, visual changes, nausea, vomiting, diarrhea, constipation, dizziness, abdominal pain, skin rash, fevers, chills, night sweats, weight loss, swollen lymph nodes, body aches, joint swelling, chest pain, shortness of breath, mood changes. POSITIVE muscle aches  Objective  Blood pressure 132/60, pulse 66, height 6\' 2"  (1.88 m), weight 213 lb (96.6 kg), SpO2 99 %.   General: No apparent distress alert and oriented x3 mood and affect normal, dressed appropriately.  HEENT: Pupils equal, extraocular movements intact  Respiratory: Patient's speak in full  sentences and does not appear short of breath  Cardiovascular: No lower extremity edema, non tender, no erythema  Gait normal with good balance and coordination.  MSK: Right foot exam shows the patient does have breakdown of the transverse arch noted.  Patient does have some swelling between the first and second toe.  Mild rigidity of the first toe with bunion formation noted.  Patient does not have any significant swelling noted today.  Right knee exam does have severe loss of joint space on the medial aspect of the knee.  This does give patient a varus deformity of the knee to a certain degree.  Some instability with valgus and varus force.  Trace effusion noted.  Limited musculoskeletal ultrasound was performed and interpreted by Lyndal Pulley  Limited ultrasound of patient's right foot head the first metatarsal shows the patient does have some arthritic changes with trace effusion noted but improvement from previous exam.  No significant cortical irregularity otherwise. Impression: Interval improvement.  After informed written and verbal consent, patient was seated on exam table. Right knee was prepped with alcohol swab and utilizing anterolateral approach, patient's right knee space was injected with 4:1  marcaine 0.5%: Kenalog 40mg /dL. Patient tolerated the procedure well without immediate complications..    Impression and Recommendations:     The above documentation has been reviewed and is accurate and complete Lyndal Pulley, DO

## 2020-11-08 ENCOUNTER — Ambulatory Visit: Payer: Medicare Other | Admitting: Family Medicine

## 2020-11-08 ENCOUNTER — Other Ambulatory Visit: Payer: Self-pay

## 2020-11-08 ENCOUNTER — Encounter: Payer: Self-pay | Admitting: Family Medicine

## 2020-11-08 ENCOUNTER — Ambulatory Visit: Payer: Self-pay

## 2020-11-08 ENCOUNTER — Ambulatory Visit (INDEPENDENT_AMBULATORY_CARE_PROVIDER_SITE_OTHER): Payer: Medicare Other

## 2020-11-08 VITALS — BP 132/60 | HR 66 | Ht 74.0 in | Wt 213.0 lb

## 2020-11-08 DIAGNOSIS — E782 Mixed hyperlipidemia: Secondary | ICD-10-CM

## 2020-11-08 DIAGNOSIS — G8929 Other chronic pain: Secondary | ICD-10-CM

## 2020-11-08 DIAGNOSIS — M25561 Pain in right knee: Secondary | ICD-10-CM

## 2020-11-08 DIAGNOSIS — R7989 Other specified abnormal findings of blood chemistry: Secondary | ICD-10-CM

## 2020-11-08 DIAGNOSIS — M1711 Unilateral primary osteoarthritis, right knee: Secondary | ICD-10-CM

## 2020-11-08 DIAGNOSIS — I4819 Other persistent atrial fibrillation: Secondary | ICD-10-CM

## 2020-11-08 DIAGNOSIS — R739 Hyperglycemia, unspecified: Secondary | ICD-10-CM

## 2020-11-08 DIAGNOSIS — M659 Synovitis and tenosynovitis, unspecified: Secondary | ICD-10-CM

## 2020-11-08 DIAGNOSIS — E039 Hypothyroidism, unspecified: Secondary | ICD-10-CM

## 2020-11-08 DIAGNOSIS — I10 Essential (primary) hypertension: Secondary | ICD-10-CM | POA: Diagnosis not present

## 2020-11-08 DIAGNOSIS — M79674 Pain in right toe(s): Secondary | ICD-10-CM

## 2020-11-08 NOTE — Assessment & Plan Note (Signed)
On Levothyroxine, continue to monitor 

## 2020-11-08 NOTE — Assessment & Plan Note (Signed)
He hs noted a few more ectopic beats this week since having his covid shot this week no other symptoms and he has an appt with afib clinic next week

## 2020-11-08 NOTE — Assessment & Plan Note (Signed)
Injection given today, tolerated the procedure well, discussed icing regimen and home exercise, increase activity slowly.  Follow-up with me again 6 weeks.  Could be a candidate for viscosupplementation and we will try to get prior approval if necessary.  Any increase in instability with patient's abnormal thigh to calf ratio may need to consider already stability brace custom.

## 2020-11-08 NOTE — Assessment & Plan Note (Signed)
Well controlled, no changes to meds. Encouraged heart healthy diet such as the DASH diet and exercise as tolerated.  °

## 2020-11-08 NOTE — Patient Instructions (Addendum)
Good to see you Injected the knee Will get you approved for get 3 hours the firs day add an hour a day for the metatarsal pads See me again in 6 weeks

## 2020-11-08 NOTE — Patient Instructions (Addendum)
[facility]   Consider Melatonin 2-6 mg at bedtime Magnesium Glycinate 200-400 mg at bedtime for sleep Atrial Fibrillation  Atrial fibrillation is a type of heartbeat that is irregular or fast. If you have this condition, your heart beats without any order. This makes it hard for your heart to pump blood in a normal way. Atrial fibrillation may come and go, or it may become a long-lasting problem. If this condition is not treated, it can put you at higher risk for stroke, heart failure, and other heart problems. What are the causes? This condition may be caused by diseases that damage the heart. They include:  High blood pressure.  Heart failure.  Heart valve disease.  Heart surgery. Other causes include:  Diabetes.  Thyroid disease.  Being overweight.  Kidney disease. Sometimes the cause is not known. What increases the risk? You are more likely to develop this condition if:  You are older.  You smoke.  You exercise often and very hard.  You have a family history of this condition.  You are a man.  You use drugs.  You drink a lot of alcohol.  You have lung conditions, such as emphysema, pneumonia, or COPD.  You have sleep apnea. What are the signs or symptoms? Common symptoms of this condition include:  A feeling that your heart is beating very fast.  Chest pain or discomfort.  Feeling short of breath.  Suddenly feeling light-headed or weak.  Getting tired easily during activity.  Fainting.  Sweating. In some cases, there are no symptoms. How is this treated? Treatment for this condition depends on underlying conditions and how you feel when you have atrial fibrillation. They include:  Medicines to: ? Prevent blood clots. ? Treat heart rate or heart rhythm problems.  Using devices, such as a pacemaker, to correct heart rhythm problems.  Doing surgery to remove the part of the heart that sends bad signals.  Closing an area where clots can  form in the heart (left atrial appendage). In some cases, your doctor will treat other underlying conditions. Follow these instructions at home: Medicines  Take over-the-counter and prescription medicines only as told by your doctor.  Do not take any new medicines without first talking to your doctor.  If you are taking blood thinners: ? Talk with your doctor before you take any medicines that have aspirin or NSAIDs, such as ibuprofen, in them. ? Take your medicine exactly as told by your doctor. Take it at the same time each day. ? Avoid activities that could hurt or bruise you. Follow instructions about how to prevent falls. ? Wear a bracelet that says you are taking blood thinners. Or, carry a card that lists what medicines you take. Lifestyle  Do not use any products that have nicotine or tobacco in them. These include cigarettes, e-cigarettes, and chewing tobacco. If you need help quitting, ask your doctor.  Eat heart-healthy foods. Talk with your doctor about the right eating plan for you.  Exercise regularly as told by your doctor.  Do not drink alcohol.  Lose weight if you are overweight.  Do not use drugs, including cannabis.      General instructions  If you have a condition that causes breathing to stop for a short period of time (apnea), treat it as told by your doctor.  Keep a healthy weight. Do not use diet pills unless your doctor says they are safe for you. Diet pills may make heart problems worse.  Keep all  follow-up visits as told by your doctor. This is important. Contact a doctor if:  You notice a change in the speed, rhythm, or strength of your heartbeat.  You are taking a blood-thinning medicine and you get more bruising.  You get tired more easily when you move or exercise.  You have a sudden change in weight. Get help right away if:  You have pain in your chest or your belly (abdomen).  You have trouble breathing.  You have side effects of  blood thinners, such as blood in your vomit, poop (stool), or pee (urine), or bleeding that cannot stop.  You have any signs of a stroke. "BE FAST" is an easy way to remember the main warning signs: ? B - Balance. Signs are dizziness, sudden trouble walking, or loss of balance. ? E - Eyes. Signs are trouble seeing or a change in how you see. ? F - Face. Signs are sudden weakness or loss of feeling in the face, or the face or eyelid drooping on one side. ? A - Arms. Signs are weakness or loss of feeling in an arm. This happens suddenly and usually on one side of the body. ? S - Speech. Signs are sudden trouble speaking, slurred speech, or trouble understanding what people say. ? T - Time. Time to call emergency services. Write down what time symptoms started.  You have other signs of a stroke, such as: ? A sudden, very bad headache with no known cause. ? Feeling like you may vomit (nausea). ? Vomiting. ? A seizure. These symptoms may be an emergency. Do not wait to see if the symptoms will go away. Get medical help right away. Call your local emergency services (911 in the [place] Do not drive yourself to the hospital.   Summary  Atrial fibrillation is a type of heartbeat that is irregular or fast.  You are at higher risk of this condition if you smoke, are older, have diabetes, or are overweight.  Follow your doctor's instructions about medicines, diet, exercise, and follow-up visits.  Get help right away if you have signs or symptoms of a stroke.  Get help right away if you cannot catch your breath, or you have chest pain or discomfort. This information is not intended to replace advice given to you by your health care provider. Make sure you discuss any questions you have with your health care provider. Document Revised: 12/28/2018 Document Reviewed: 12/28/2018 Elsevier Patient Education  [place].

## 2020-11-08 NOTE — Assessment & Plan Note (Signed)
Encouraged heart healthy diet, increase exercise, avoid trans fats, consider a krill oil cap daily 

## 2020-11-08 NOTE — Assessment & Plan Note (Signed)
Supplement and monitor 

## 2020-11-08 NOTE — Assessment & Plan Note (Signed)
hgba1c acceptable, minimize simple carbs. Increase exercise as tolerated.  

## 2020-11-08 NOTE — Assessment & Plan Note (Signed)
Cellulitis does seem to be improved today.  No significant other changes.  Continue to make progress with the orthotics as well as the carbon fiber plate.  Patient was given new metatarsal pads and hopefully this will be beneficial as well.  Follow-up with me again in 6 weeks

## 2020-11-10 NOTE — Progress Notes (Signed)
Subjective:    Patient ID: Tanner [name], male    DOB: 1935-04-25, 86 y.o.   MRN: [id number]  Chief Complaint  Patient presents with  . Follow-up  . Hypothyroidism    Pt has no problems or concerns    HPI Patient is in today for follow up on chronic medical concerns. No recent febrile illness or hospitalizations. Has been trying to maintain a heart healthy diet and stay active. Is still mourning the loss of his ex wife with whom he was very close but overall he is doing well. Denies CP/palp/SOB/HA/congestion/fevers/GI or GU c/o. Taking meds as prescribed  Past Medical History:  Diagnosis Date  . Allergy    seasonal- mostly spring  . Anemia 02/03/2011  . ASTHMA, CHILDHOOD 03/17/2010   mostly as child  . BCC (basal cell carcinoma) 12/08/2011  . CAROTID ARTERY OCCLUSION, WITH INFARCTION 03/17/2010  . CEREBROVASCULAR ACCIDENT 03/17/2010   partial loss of peripheral vision on left-"slight improved"  . CHICKENPOX, HX OF 03/17/2010  . Constipation 12/08/2014  . Epistaxis 09/06/2013  . FATIGUE 03/17/2010  . Hearing loss 11/04/2010   bilateral  . HYPERKALEMIA 05/27/2010  . Hyperlipidemia   . Hypertension   . Hypothyroid 12/08/2011  . Mixed hyperlipidemia 03/17/2010  . Oral lesion 11/20/2014  . Overweight(278.02) 07/01/2010  . Peripheral neuropathy 08/18/2016  . Personal history of other infectious and parasitic disease 03/17/2010  . Preventative health care 08/23/2016  . Skin lesion of right arm 11/20/2014  . Sleep apnea    uses mouthpiece only  . Thrombocytopenia ([place]) 04/05/2012  . TOBACCO ABUSE, HX OF 03/17/2010  . Tubular adenoma of colon 06/10/2011    Past Surgical History:  Procedure Laterality Date  . CARDIOVERSION N/A 08/11/2019   Procedure: CARDIOVERSION;  Surgeon: Josue Hector, MD;  Location[facility] ENDOSCOPY;  Service: Cardiovascular;  Laterality: N/A;  . CAROTID ENDARTERECTOMY Left February 13, 2013   cea  . CATARACT EXTRACTION, BILATERAL Right    Cataract, and stigmatism  .  COLONOSCOPY  Dec. 2014  . ENDARTERECTOMY Left 02/13/2013   Procedure: ENDARTERECTOMY CAROTID;  Surgeon: Rosetta Posner, MD;  Location: [facility];  Service: Vascular;  Laterality: Left;  . EUS N/A 08/17/2013   Procedure: UPPER ENDOSCOPIC ULTRASOUND (EUS) LINEAR;  Surgeon: Milus Banister, MD;  Location: [facility] ENDOSCOPY;  Service: Endoscopy;  Laterality: N/A;  . knee cartliage repair  1964, 1990   bilateral  . Amber   removed  . ROTATOR CUFF REPAIR     right  . TONSILLECTOMY    . UPPER GI ENDOSCOPY  Jan. 2015    Family History  Problem Relation Age of Onset  . Alzheimer's disease Mother   . Emphysema Sister        cigarettes  . Cancer Sister        lung, tobacco   . Heart disease Son   . Coronary artery disease Brother   . Heart attack Brother   . Other Brother        heart problems- from fathers side  . Cancer Brother 64       lung? smoker  . Heart disease Brother 36       Heart disease before age 30  . Cancer Sister        gyn  . Heart disease Sister   . Bipolar disorder Daughter   . Obesity Daughter   . Stroke Father   . Heart disease Sister   . Esophageal cancer Neg Hx   .  Colon cancer Neg Hx   . Pancreatic cancer Neg Hx   . Stomach cancer Neg Hx     Social History   Socioeconomic History  . Marital status: Divorced    Spouse name: Not on file  . Number of children: 3  . Years of education: 60yrs  . Highest education level: Bachelor's degree (e.g., BA, AB, BS)  Occupational History  . Occupation: retired    Fish farm manager: RETIRED  Tobacco Use  . Smoking status: Former Smoker    Packs/day: 2.00    Years: 20.00    Pack years: 40.00    Types: Cigarettes    Quit date: 07/19/1977    Years since quitting: 43.3  . Smokeless tobacco: Never Used  Vaping Use  . Vaping Use: Never used  Substance and Sexual Activity  . Alcohol use: Yes    Alcohol/week: 7.0 standard drinks    Types: 7 Glasses of wine per week    Comment: dinner wine daily  . Drug  use: No  . Sexual activity: Never  Other Topics Concern  . Not on file  Social History Narrative   Pt lives at home alone.    Caffeine Use: very little   Heart healthy diet, is planning exercise      Retired from Event organiser   Left handed   Social Determinants of Health   Financial Resource Strain: Not on file  Food Insecurity: Not on file  Transportation Needs: Not on file  Physical Activity: Not on file  Stress: Not on file  Social Connections: Not on file  Intimate Partner Violence: Not on file    Outpatient Medications Prior to Visit  Medication Sig Dispense Refill  . acetaminophen (TYLENOL) 500 MG tablet Take 1,000 mg by mouth as needed for moderate pain or headache.     [name] aspirin EC 81 MG tablet Take 81 mg by mouth every evening.     . chlorthalidone (HYGROTON) 25 MG tablet TAKE 1 TABLET BY MOUTH IN  THE MORNING 90 tablet 3  . levothyroxine (SYNTHROID) 88 MCG tablet TAKE 1 TABLET BY MOUTH  DAILY BEFORE BREAKFAST 90 tablet 3  . lubiprostone (AMITIZA) 8 MCG capsule Take 1 capsule (8 mcg total) by mouth 2 (two) times daily with a meal. D/c linzess 180 capsule 3  . metoprolol succinate (TOPROL XL) 25 MG 24 hr tablet Take 0.5 tablets (12.5 mg total) by mouth daily. 90 tablet 1  . pantoprazole (PROTONIX) 40 MG tablet TAKE 1 TABLET BY MOUTH  DAILY 90 tablet 3  . ramipril (ALTACE) 10 MG capsule TAKE 1 CAPSULE BY MOUTH  TWICE DAILY 180 capsule 3  . rivaroxaban (XARELTO) 20 MG TABS tablet Take 1 tablet (20 mg total) by mouth daily with supper. 90 tablet 1  . rosuvastatin (CRESTOR) 40 MG tablet TAKE 1 TABLET BY MOUTH IN  THE EVENING 90 tablet 3   No facility-administered medications prior to visit.    No Known Allergies  ROS     Objective:    Physical Exam Vitals and nursing note reviewed.  Constitutional:      General: He is not in acute distress.    Appearance: He is well-developed.  HENT:     Head: Normocephalic and atraumatic.     Nose: Nose normal.  Eyes:      General:        Right eye: No discharge.        Left eye: No discharge.  Cardiovascular:     Rate and  Rhythm: Normal rate and regular rhythm.     Heart sounds: No murmur heard.   Pulmonary:     Effort: Pulmonary effort is normal.     Breath sounds: Normal breath sounds.  Abdominal:     General: Bowel sounds are normal.     Palpations: Abdomen is soft.     Tenderness: There is no abdominal tenderness.  Musculoskeletal:     Cervical back: Normal range of motion and neck supple.  Skin:    General: Skin is warm and dry.  Neurological:     Mental Status: He is alert and oriented to person, place, and time.     BP 138/84   Pulse 60   Temp 97.6 F (36.4 C)   Resp 16   Wt 212 lb 9.6 oz (96.4 kg)   SpO2 99%   BMI 27.30 kg/m  Wt Readings from Last 3 Encounters:  11/08/20 212 lb 9.6 oz (96.4 kg)  11/08/20 213 lb (96.6 kg)  10/17/20 210 lb (95.3 kg)    Diabetic Foot Exam - Simple   No data filed    Lab Results  Component Value Date   WBC 6.3 10/24/2020   HGB 13.5 10/24/2020   HCT 39.8 10/24/2020   PLT 166.0 10/24/2020   GLUCOSE 88 10/24/2020   CHOL 141 10/24/2020   TRIG 112.0 10/24/2020   HDL 44.90 10/24/2020   LDLCALC 74 10/24/2020   ALT 13 10/24/2020   AST 13 10/24/2020   NA 140 10/24/2020   K 4.4 10/24/2020   CL 102 10/24/2020   CREATININE 1.19 10/24/2020   BUN 21 10/24/2020   CO2 32 10/24/2020   TSH 2.26 10/24/2020   PSA 0.39 05/08/2014   INR 0.99 02/07/2013   HGBA1C 5.9 10/24/2020   MICROALBUR 2.0 (H) 12/03/2015    Lab Results  Component Value Date   TSH 2.26 10/24/2020   Lab Results  Component Value Date   WBC 6.3 10/24/2020   HGB 13.5 10/24/2020   HCT 39.8 10/24/2020   MCV 92.7 10/24/2020   PLT 166.0 10/24/2020   Lab Results  Component Value Date   NA 140 10/24/2020   K 4.4 10/24/2020   CO2 32 10/24/2020   GLUCOSE 88 10/24/2020   BUN 21 10/24/2020   CREATININE 1.19 10/24/2020   BILITOT 0.6 10/24/2020   ALKPHOS 59 10/24/2020   AST  13 10/24/2020   ALT 13 10/24/2020   PROT 6.6 10/24/2020   ALBUMIN 4.2 10/24/2020   CALCIUM 9.7 10/24/2020   ANIONGAP 7 08/21/2020   GFR 56.38 (L) 10/24/2020   Lab Results  Component Value Date   CHOL 141 10/24/2020   Lab Results  Component Value Date   HDL 44.90 10/24/2020   Lab Results  Component Value Date   LDLCALC 74 10/24/2020   Lab Results  Component Value Date   TRIG 112.0 10/24/2020   Lab Results  Component Value Date   CHOLHDL 3 10/24/2020   Lab Results  Component Value Date   HGBA1C 5.9 10/24/2020       Assessment & Plan:   Problem List Items Addressed This Visit    Hypothyroid (Chronic)    On Levothyroxine, continue to monitor      Relevant Orders   TSH   Mixed hyperlipidemia    Encouraged heart healthy diet, increase exercise, avoid trans fats, consider a krill oil cap daily      Relevant Orders   Lipid panel   Essential hypertension, benign    Well  controlled, no changes to meds. Encouraged heart healthy diet such as the DASH diet and exercise as tolerated.       Relevant Orders   CBC   Hyperglycemia    hgba1c acceptable, minimize simple carbs. Increase exercise as tolerated.      Relevant Orders   Hemoglobin A1c   Comprehensive metabolic panel   TSH   Persistent atrial fibrillation ([place])    He hs noted a few more ectopic beats this week since having his covid shot this week no other symptoms and he has an appt with afib clinic next week      Low vitamin D level    Supplement and monitor      Relevant Orders   VITAMIN D 25 Hydroxy (Vit-D Deficiency, Fractures)      I am having Wendie Agreste. Ziebell "Phil" maintain his aspirin EC, acetaminophen, levothyroxine, ramipril, metoprolol succinate, rosuvastatin, pantoprazole, lubiprostone, chlorthalidone, and rivaroxaban.  No orders of the defined types were placed in this encounter.    Penni Homans, MD

## 2020-11-11 NOTE — Progress Notes (Signed)
Primary Care Physician: [name], MD Primary Cardiologist: Dr [name] Cancel Primary Electrophysiologist: none Referring Physician: Dr Shirlee More is a 86 y.o. male with a history of HTN, tobacco abuse quit 2011, CVA 2011, HLD, hypothyroidism, OSA, and persistent atrial fibrillation who presents for follow up in the [facility]. The patient was initially diagnosed with atrial fibrillation 05/2019 at his PCP office after noting irregular readings on his FitBit. He is otherwise asymptomatic and rate controlled. Patient is on Xarelto for a CHADS2VASC score of 6. Patient is s/p DCCV on 08/11/19.   On follow up today, patient reports that he has done well since his last visit. He did have some brief irregular heart beats after receiving a COVID booster but this has resolved. He denies any bleeding issues on anticoagulation.   Today, he denies symptoms of chest pain, shortness of breath, orthopnea, PND, lower extremity edema, dizziness, presyncope, syncope, bleeding, or neurologic sequela. The patient is tolerating medications without difficulties and is otherwise without complaint today.    Atrial Fibrillation Risk Factors:  he does have symptoms or diagnosis of sleep apnea. he is compliant with oral device therapy. he does not have a history of rheumatic fever. he does have a history of alcohol use. The patient does not have a history of early familial atrial fibrillation or other arrhythmias.  he has a BMI of Body mass index is 27.68 kg/m.[name] Filed Weights   11/12/20 0845  Weight: 97.8 kg    Family History  Problem Relation Age of Onset  . Alzheimer's disease Mother   . Emphysema Sister        cigarettes  . Cancer Sister        lung, tobacco   . Heart disease Son   . Coronary artery disease Brother   . Heart attack Brother   . Other Brother        heart problems- from fathers side  . Cancer Brother 76       lung? smoker  . Heart  disease Brother 28       Heart disease before age 51  . Cancer Sister        gyn  . Heart disease Sister   . Bipolar disorder Daughter   . Obesity Daughter   . Stroke Father   . Heart disease Sister   . Esophageal cancer Neg Hx   . Colon cancer Neg Hx   . Pancreatic cancer Neg Hx   . Stomach cancer Neg Hx      Atrial Fibrillation Management history:  Previous antiarrhythmic drugs: none Previous cardioversions: 08/11/19 Previous ablations: none CHADS2VASC score: 6 Anticoagulation history: Xarelto   Past Medical History:  Diagnosis Date  . Allergy    seasonal- mostly spring  . Anemia 02/03/2011  . ASTHMA, CHILDHOOD 03/17/2010   mostly as child  . BCC (basal cell carcinoma) 12/08/2011  . CAROTID ARTERY OCCLUSION, WITH INFARCTION 03/17/2010  . CEREBROVASCULAR ACCIDENT 03/17/2010   partial loss of peripheral vision on left-"slight improved"  . CHICKENPOX, HX OF 03/17/2010  . Constipation 12/08/2014  . Epistaxis 09/06/2013  . FATIGUE 03/17/2010  . Hearing loss 11/04/2010   bilateral  . HYPERKALEMIA 05/27/2010  . Hyperlipidemia   . Hypertension   . Hypothyroid 12/08/2011  . Mixed hyperlipidemia 03/17/2010  . Oral lesion 11/20/2014  . Overweight(278.02) 07/01/2010  . Peripheral neuropathy 08/18/2016  . Personal history of other infectious and parasitic disease 03/17/2010  . Preventative health care 08/23/2016  .  Skin lesion of right arm 11/20/2014  . Sleep apnea    uses mouthpiece only  . Thrombocytopenia ([place]) 04/05/2012  . TOBACCO ABUSE, HX OF 03/17/2010  . Tubular adenoma of colon 06/10/2011   Past Surgical History:  Procedure Laterality Date  . CARDIOVERSION N/A 08/11/2019   Procedure: CARDIOVERSION;  Surgeon: Josue Hector, MD;  Location: [facility] ENDOSCOPY;  Service: Cardiovascular;  Laterality: N/A;  . CAROTID ENDARTERECTOMY Left February 13, 2013   cea  . CATARACT EXTRACTION, BILATERAL Right    Cataract, and stigmatism  . COLONOSCOPY  Dec. 2014  . ENDARTERECTOMY Left 02/13/2013    Procedure: ENDARTERECTOMY CAROTID;  Surgeon: Rosetta Posner, MD;  Location: [facility];  Service: Vascular;  Laterality: Left;  . EUS N/A 08/17/2013   Procedure: UPPER ENDOSCOPIC ULTRASOUND (EUS) LINEAR;  Surgeon: Milus Banister, MD;  Location: [facility] ENDOSCOPY;  Service: Endoscopy;  Laterality: N/A;  . knee cartliage repair  1964, 1990   bilateral  . [place]   removed  . ROTATOR CUFF REPAIR     right  . TONSILLECTOMY    . UPPER GI ENDOSCOPY  Jan. 2015    Current Outpatient Medications  Medication Sig Dispense Refill  . acetaminophen (TYLENOL) 500 MG tablet Take 1,000 mg by mouth as needed for moderate pain or headache.     [name] aspirin EC 81 MG tablet Take 81 mg by mouth every evening.     . chlorthalidone (HYGROTON) 25 MG tablet TAKE 1 TABLET BY MOUTH IN  THE MORNING 90 tablet 3  . levothyroxine (SYNTHROID) 88 MCG tablet TAKE 1 TABLET BY MOUTH  DAILY BEFORE BREAKFAST 90 tablet 3  . lubiprostone (AMITIZA) 8 MCG capsule Take 1 capsule (8 mcg total) by mouth 2 (two) times daily with a meal. D/c linzess 180 capsule 3  . metoprolol succinate (TOPROL XL) 25 MG 24 hr tablet Take 0.5 tablets (12.5 mg total) by mouth daily. 90 tablet 1  . pantoprazole (PROTONIX) 40 MG tablet TAKE 1 TABLET BY MOUTH  DAILY 90 tablet 3  . ramipril (ALTACE) 10 MG capsule TAKE 1 CAPSULE BY MOUTH  TWICE DAILY 180 capsule 3  . rivaroxaban (XARELTO) 20 MG TABS tablet Take 1 tablet (20 mg total) by mouth daily with supper. 90 tablet 1  . rosuvastatin (CRESTOR) 40 MG tablet TAKE 1 TABLET BY MOUTH IN  THE EVENING 90 tablet 3   No current facility-administered medications for this encounter.    No Known Allergies  Social History   Socioeconomic History  . Marital status: Divorced    Spouse name: Not on file  . Number of children: 3  . Years of education: 77yrs  . Highest education level: Bachelor's degree (e.g., BA, AB, BS)  Occupational History  . Occupation: retired    Fish farm manager: RETIRED  Tobacco Use   . Smoking status: Former Smoker    Packs/day: 2.00    Years: 20.00    Pack years: 40.00    Types: Cigarettes    Quit date: 07/19/1977    Years since quitting: 43.3  . Smokeless tobacco: Never Used  Vaping Use  . Vaping Use: Never used  Substance and Sexual Activity  . Alcohol use: Yes    Alcohol/week: 5.0 standard drinks    Types: 2 Cans of beer, 2 Glasses of wine, 1 Shots of liquor per week    Comment: dinner wine daily  . Drug use: No  . Sexual activity: Never  Other Topics Concern  . Not  on file  Social History Narrative   Pt lives at home alone.    Caffeine Use: very little   Heart healthy diet, is planning exercise      Retired from Event organiser   Left handed   Social Determinants of Health   Financial Resource Strain: Not on file  Food Insecurity: Not on file  Transportation Needs: Not on file  Physical Activity: Not on file  Stress: Not on file  Social Connections: Not on file  Intimate Partner Violence: Not on file     ROS- All systems are reviewed and negative except as per the HPI above.  Physical Exam: Vitals:   11/12/20 0845  BP: (!) 150/80  Pulse: (!) 58  Weight: 97.8 kg  Height: 6\' 2"  (1.88 m)    GEN- The patient is a well appearing elderly male, alert and oriented x 3 today.   HEENT-head normocephalic, atraumatic, sclera clear, conjunctiva pink, hearing intact, trachea midline. Lungs- Clear to ausculation bilaterally, normal work of breathing Heart- Regular rate and rhythm, no rubs or gallops, 2/6 systolic murmur   GI- soft, NT, ND, + BS Extremities- no clubbing, cyanosis, or edema MS- no significant deformity or atrophy Skin- no rash or lesion Psych- euthymic mood, full affect Neuro- strength and sensation are intact   Wt Readings from Last 3 Encounters:  11/12/20 97.8 kg  11/08/20 96.4 kg  11/08/20 96.6 kg    EKG today demonstrates  SB, 1st degree AV block Vent. rate 58 BPM PR interval 208 ms QRS duration 96 ms QT/QTcB  408/400 ms  Echo 07/19/19 demonstrated   1. Left ventricular ejection fraction, by visual estimation, is 60 to 65%. The left ventricle has normal function. There is no left ventricular hypertrophy.  2. Left ventricular diastolic parameters are consistent with Grade II diastolic dysfunction (pseudonormalization).  3. The left ventricle has no regional wall motion abnormalities.  4. Global right ventricle has normal systolic function.The right ventricular size is normal. No increase in right ventricular wall thickness.  5. Left atrial size was moderately dilated.  6. Right atrial size was mildly dilated.  7. The mitral valve is normal in structure. Mild mitral valve regurgitation. No evidence of mitral stenosis.  8. The tricuspid valve is normal in structure.  9. Aortic valve mean gradient measures 8.0 mmHg. 10. The aortic valve is normal in structure. Aortic valve regurgitation is mild. Mild aortic valve stenosis. 11. Pulmonic regurgitation is mild. 12. The pulmonic valve was normal in structure. Pulmonic valve regurgitation is mild. 13. Normal pulmonary artery systolic pressure. 14. The tricuspid regurgitant velocity is 2.30 m/s, and with an assumed right atrial pressure of 3 mmHg, the estimated right ventricular systolic pressure is normal at 24.2 mmHg. 15. The inferior vena cava is normal in size with greater than 50% respiratory variability, suggesting right atrial pressure of 3 mmHg.  Epic records are reviewed at length today  CHA2DS2-VASc Score = 6 The patient's score is based upon: CHF History: No HTN History: Yes Age : 48 + Diabetes History: No Stroke History: Yes Vascular Disease History: Yes Gender: Male     ASSESSMENT AND PLAN: 1. Persistent Atrial Fibrillation (ICD10:  I48.19) The patient's CHA2DS2-VASc score is 6, indicating a 9.7% annual risk of stroke.   Patient appears to be maintaining SR. Continue Toprol 12.5 mg daily Continue Xarelto 20 mg daily.  Continue ASA  per Dr [name] Cancel for carotid disease.   2. Secondary Hypercoagulable State (ICD10:  D68.69) The patient is at significant  risk for stroke/thromboembolism based upon his CHA2DS2-VASc Score of 6.  Continue Rivaroxaban (Xarelto).   3. Obstructive sleep apnea Patient reports compliance with oral device. Followed by Dr Ander Slade.  4. HTN Stable, no changes today.   Follow up with Dr [name] Cancel in 6 months. AF clinic in one year.    [facility] [address] [place], [place] [address] [phone] 11/12/2020 8:49 AM

## 2020-11-12 ENCOUNTER — Encounter (HOSPITAL_COMMUNITY): Payer: Self-pay | Admitting: Physician Assistant

## 2020-11-12 ENCOUNTER — Other Ambulatory Visit: Payer: Self-pay

## 2020-11-12 ENCOUNTER — Ambulatory Visit (HOSPITAL_COMMUNITY)
Admission: RE | Admit: 2020-11-12 | Discharge: 2020-11-12 | Disposition: A | Discharge status: Home / Self Care | Payer: Medicare Other | Source: Ambulatory Visit | Attending: Physician Assistant | Admitting: Physician Assistant

## 2020-11-12 VITALS — BP 150/80 | HR 58 | Ht 74.0 in | Wt 215.6 lb

## 2020-11-12 DIAGNOSIS — I10 Essential (primary) hypertension: Secondary | ICD-10-CM | POA: Diagnosis not present

## 2020-11-12 DIAGNOSIS — D6869 Other thrombophilia: Secondary | ICD-10-CM | POA: Diagnosis not present

## 2020-11-12 DIAGNOSIS — E039 Hypothyroidism, unspecified: Secondary | ICD-10-CM | POA: Insufficient documentation

## 2020-11-12 DIAGNOSIS — G4733 Obstructive sleep apnea (adult) (pediatric): Secondary | ICD-10-CM | POA: Insufficient documentation

## 2020-11-12 DIAGNOSIS — I4819 Other persistent atrial fibrillation: Secondary | ICD-10-CM

## 2020-11-12 DIAGNOSIS — Z87891 Personal history of nicotine dependence: Secondary | ICD-10-CM | POA: Diagnosis not present

## 2020-11-12 DIAGNOSIS — Z8249 Family history of ischemic heart disease and other diseases of the circulatory system: Secondary | ICD-10-CM | POA: Insufficient documentation

## 2020-11-12 DIAGNOSIS — Z7982 Long term (current) use of aspirin: Secondary | ICD-10-CM | POA: Insufficient documentation

## 2020-11-12 DIAGNOSIS — Z8673 Personal history of transient ischemic attack (TIA), and cerebral infarction without residual deficits: Secondary | ICD-10-CM | POA: Insufficient documentation

## 2020-11-12 DIAGNOSIS — Z7901 Long term (current) use of anticoagulants: Secondary | ICD-10-CM | POA: Insufficient documentation

## 2020-11-12 DIAGNOSIS — Z79899 Other long term (current) drug therapy: Secondary | ICD-10-CM | POA: Diagnosis not present

## 2020-11-12 DIAGNOSIS — Z79890 Hormone replacement therapy (postmenopausal): Secondary | ICD-10-CM | POA: Diagnosis not present

## 2020-11-13 ENCOUNTER — Encounter: Payer: Self-pay | Admitting: Family Medicine

## 2020-11-18 ENCOUNTER — Ambulatory Visit: Payer: Self-pay

## 2020-11-18 ENCOUNTER — Ambulatory Visit: Payer: Medicare Other | Admitting: Family Medicine

## 2020-11-18 ENCOUNTER — Other Ambulatory Visit: Payer: Self-pay

## 2020-11-18 DIAGNOSIS — G8929 Other chronic pain: Secondary | ICD-10-CM

## 2020-11-18 DIAGNOSIS — M1711 Unilateral primary osteoarthritis, right knee: Secondary | ICD-10-CM

## 2020-11-18 DIAGNOSIS — M25561 Pain in right knee: Secondary | ICD-10-CM

## 2020-11-18 NOTE — Progress Notes (Signed)
Abbe Amsterdam presents to clinic today for Duroloane injection right  Knee  Procedure: Real-time Ultrasound Guided Injection of right knee superior lateral patellar space Device: Philips Affiniti 50G Images permanently stored and available for review in PACS Verbal informed consent obtained.  Discussed risks and benefits of procedure. Warned about infection bleeding damage to structures skin hypopigmentation and fat atrophy among others. Patient expresses understanding and agreement Time-out conducted.   Noted no overlying erythema, induration, or other signs of local infection.   Skin prepped in a sterile fashion.   Local anesthesia: Topical Ethyl chloride.   With sterile technique and under real time ultrasound guidance:  Durolane 60 mg injected into knee joint. Fluid seen entering the joint capsule.   Completed without difficulty   Advised to call if fevers/chills, erythema, induration, drainage, or persistent bleeding.   Images permanently stored and available for review in the ultrasound unit.  Impression: Technically successful ultrasound guided injection.    Lot number: [id number]  Discussed possibility OA offloader brace in the future.

## 2020-11-18 NOTE — Patient Instructions (Addendum)
Good to see you again.  You had a R knee Durolane injection today.  Call or go to the ER if you develop a large red swollen joint with extreme pain or oozing puss.   Follow-up as needed.  I or Dr Tamala Julian am happy to see you at any time to have a dedicated visit about your toe.

## 2020-11-24 ENCOUNTER — Other Ambulatory Visit: Payer: Self-pay | Admitting: Cardiovascular Disease

## 2020-11-24 DIAGNOSIS — I4819 Other persistent atrial fibrillation: Secondary | ICD-10-CM

## 2020-11-25 NOTE — Telephone Encounter (Signed)
Pt's age 86, wt 97.8 kg, SCr 1.19, CrCl 65.06, last ov w/ 11/12/20.

## 2020-12-10 ENCOUNTER — Other Ambulatory Visit: Payer: Self-pay | Admitting: Family Medicine

## 2020-12-31 ENCOUNTER — Other Ambulatory Visit: Payer: Self-pay

## 2020-12-31 ENCOUNTER — Ambulatory Visit (INDEPENDENT_AMBULATORY_CARE_PROVIDER_SITE_OTHER): Payer: Medicare Other | Admitting: Family Medicine

## 2020-12-31 ENCOUNTER — Encounter: Payer: Self-pay | Admitting: Family Medicine

## 2020-12-31 VITALS — BP 130/80 | HR 68 | Ht 74.0 in | Wt 212.0 lb

## 2020-12-31 DIAGNOSIS — M216X1 Other acquired deformities of right foot: Secondary | ICD-10-CM

## 2020-12-31 DIAGNOSIS — M1711 Unilateral primary osteoarthritis, right knee: Secondary | ICD-10-CM

## 2020-12-31 DIAGNOSIS — M25579 Pain in unspecified ankle and joints of unspecified foot: Secondary | ICD-10-CM

## 2020-12-31 DIAGNOSIS — M216X9 Other acquired deformities of unspecified foot: Secondary | ICD-10-CM | POA: Insufficient documentation

## 2020-12-31 NOTE — Progress Notes (Signed)
[facility] [address] [place] Phone: [phone] Subjective:   I Kandace Blitz am serving as a Education administrator for Dr. Hulan Saas.  This visit occurred during the SARS-CoV-2 public health emergency.  Safety protocols were in place, including screening questions prior to the visit, additional usage of staff PPE, and extensive cleaning of exam room while observing appropriate contact time as indicated for disinfecting solutions.   I'm seeing this patient by the request  of:  [name], MD  CC: Knee pain and foot pain follow-up  WGN:[id number]  11/08/2020 Injection given today, tolerated the procedure well, discussed icing regimen and home exercise, increase activity slowly.  Follow-up with me again 6 weeks.  Could be a candidate for viscosupplementation and we will try to get prior approval if necessary.  Any increase in instability with patient's abnormal thigh to calf ratio may need to consider already stability brace custom.   Cellulitis does seem to be improved today.  No significant other changes.  Continue to make progress with the orthotics as well as the carbon fiber plate.  Patient was given new metatarsal pads and hopefully this will be beneficial as well.  Follow-up with me again in 6 weeks  12/31/2020 AHMAUD DUTHIE is a 86 y.o. male coming in with complaint of right knee and toe pain. Knee is doing better. Toe feels good right now. States it still feels bad at times for a few days. States it either feels good or bad. States the pain is in the ball of his foot. It is better more often than painful.  Patient states that overall everything has made some improvement but is wanting to be nearly pain-free if possible.    Past Medical History:  Diagnosis Date   Allergy    seasonal- mostly spring   Anemia 02/03/2011   ASTHMA, CHILDHOOD 03/17/2010   mostly as child   BCC (basal cell carcinoma) 12/08/2011   CAROTID ARTERY OCCLUSION,  WITH INFARCTION 03/17/2010   CEREBROVASCULAR ACCIDENT 03/17/2010   partial loss of peripheral vision on left-"slight improved"   CHICKENPOX, HX OF 03/17/2010   Constipation 12/08/2014   Epistaxis 09/06/2013   FATIGUE 03/17/2010   Hearing loss 11/04/2010   bilateral   HYPERKALEMIA 05/27/2010   Hyperlipidemia    Hypertension    Hypothyroid 12/08/2011   Mixed hyperlipidemia 03/17/2010   Oral lesion 11/20/2014   Overweight(278.02) 07/01/2010   Peripheral neuropathy 08/18/2016   Personal history of other infectious and parasitic disease 03/17/2010   Preventative health care 08/23/2016   Skin lesion of right arm 11/20/2014   Sleep apnea    uses mouthpiece only   Thrombocytopenia ([place]) 04/05/2012   TOBACCO ABUSE, HX OF 03/17/2010   Tubular adenoma of colon 06/10/2011   Past Surgical History:  Procedure Laterality Date   CARDIOVERSION N/A 08/11/2019   Procedure: CARDIOVERSION;  Surgeon: Josue Hector, MD;  Location: [facility] ENDOSCOPY;  Service: Cardiovascular;  Laterality: N/A;   CAROTID ENDARTERECTOMY Left February 13, 2013   cea   CATARACT EXTRACTION, BILATERAL Right    Cataract, and stigmatism   COLONOSCOPY  Dec. 2014   ENDARTERECTOMY Left 02/13/2013   Procedure: ENDARTERECTOMY CAROTID;  Surgeon: Rosetta Posner, MD;  Location: [place];  Service: Vascular;  Laterality: Left;   EUS N/A 08/17/2013   Procedure: UPPER ENDOSCOPIC ULTRASOUND (EUS) LINEAR;  Surgeon: Milus Banister, MD;  Location: [facility] ENDOSCOPY;  Service: Endoscopy;  Laterality: N/A;   knee cartliage repair  1964,  1990   bilateral   PILONIDAL CYST EXCISION  1968   removed   ROTATOR CUFF REPAIR     right   TONSILLECTOMY     UPPER GI ENDOSCOPY  Jan. 2015   Social History   Socioeconomic History   Marital status: Divorced    Spouse name: Not on file   Number of children: 3   Years of education: 67yrs   Highest education level: Bachelor's degree (e.g., BA, AB, BS)  Occupational History   Occupation: retired    Fish farm manager: RETIRED  Tobacco Use    Smoking status: Former    Packs/day: 2.00    Years: 20.00    Pack years: 40.00    Types: Cigarettes    Quit date: 07/19/1977    Years since quitting: 43.4   Smokeless tobacco: Never  Vaping Use   Vaping Use: Never used  Substance and Sexual Activity   Alcohol use: Yes    Alcohol/week: 5.0 standard drinks    Types: 2 Cans of beer, 2 Glasses of wine, 1 Shots of liquor per week    Comment: dinner wine daily   Drug use: No   Sexual activity: Never  Other Topics Concern   Not on file  Social History Narrative   Pt lives at home alone.    Caffeine Use: very little   Heart healthy diet, is planning exercise      Retired from Event organiser   Left handed   Social Determinants of Health   Financial Resource Strain: Not on file  Food Insecurity: Not on file  Transportation Needs: Not on file  Physical Activity: Not on file  Stress: Not on file  Social Connections: Not on file   No Known Allergies Family History  Problem Relation Age of Onset   Alzheimer's disease Mother    Emphysema Sister        cigarettes   Cancer Sister        lung, tobacco    Heart disease Son    Coronary artery disease Brother    Heart attack Brother    Other Brother        heart problems- from fathers side   Cancer Brother 73       lung? smoker   Heart disease Brother 52       Heart disease before age 10   Cancer Sister        gyn   Heart disease Sister    Bipolar disorder Daughter    Obesity Daughter    Stroke Father    Heart disease Sister    Esophageal cancer Neg Hx    Colon cancer Neg Hx    Pancreatic cancer Neg Hx    Stomach cancer Neg Hx     Current Outpatient Medications (Endocrine & Metabolic):    levothyroxine (SYNTHROID) 88 MCG tablet, TAKE 1 TABLET BY MOUTH  DAILY BEFORE BREAKFAST  Current Outpatient Medications (Cardiovascular):    chlorthalidone (HYGROTON) 25 MG tablet, TAKE 1 TABLET BY MOUTH IN  THE MORNING   metoprolol succinate (TOPROL XL) 25 MG 24 hr tablet, Take  0.5 tablets (12.5 mg total) by mouth daily.   ramipril (ALTACE) 10 MG capsule, TAKE 1 CAPSULE BY MOUTH  TWICE DAILY   rosuvastatin (CRESTOR) 40 MG tablet, TAKE 1 TABLET BY MOUTH IN  THE EVENING   Current Outpatient Medications (Analgesics):    acetaminophen (TYLENOL) 500 MG tablet, Take 1,000 mg by mouth as needed for moderate pain or headache.  aspirin EC 81 MG tablet, Take 81 mg by mouth every evening.   Current Outpatient Medications (Hematological):    XARELTO 20 MG TABS tablet, TAKE 1 TABLET BY MOUTH  DAILY WITH SUPPER  Current Outpatient Medications (Other):    lubiprostone (AMITIZA) 8 MCG capsule, Take 1 capsule (8 mcg total) by mouth 2 (two) times daily with a meal. D/c linzess   pantoprazole (PROTONIX) 40 MG tablet, TAKE 1 TABLET BY MOUTH  DAILY     Review of Systems:  No headache, visual changes, nausea, vomiting, diarrhea, constipation, dizziness, abdominal pain, skin rash, fevers, chills, night sweats, weight loss, swollen lymph nodes, body aches, joint swelling, chest pain, shortness of breath, mood changes. POSITIVE muscle aches  Objective  Blood pressure 130/80, pulse 68, height 6\' 2"  (1.88 m), weight 212 lb (96.2 kg), SpO2 98 %.   General: No apparent distress alert and oriented x3 mood and affect normal, dressed appropriately.  HEENT: Pupils equal, extraocular movements intact  Respiratory: Patient's speak in full sentences and does not appear short of breath  Cardiovascular: No lower extremity edema, non tender, no erythema  Gait mild antalgic gait Foot exam shows the patient does have overpronation of the hindfoot but the patient does have significant breakdown of the transverse arch.  Patient does have some hallux limitus and rigidity noted of the first metatarsal right greater than left.  Patient is nontender though on exam today.  Right knee does have significant arthritic changes.  Instability still noted with valgus and varus force.  Lacks last 5 degrees of  flexion.  Patient will have significant decrease in effusion from previous exam.   Impression and Recommendations:     The above documentation has been reviewed and is accurate and complete Lyndal Pulley, DO

## 2020-12-31 NOTE — Patient Instructions (Addendum)
Good to see you Try not to use carbon fiber plate and see how that feels Give the knee more time See me again in 3 months

## 2020-12-31 NOTE — Assessment & Plan Note (Signed)
Patient responded very well to the viscosupplementation provided by another provider.  Discussed with patient about icing regimen and home exercises, patient will continue to stay active.  Hopefully patient will continue to do well for multiple months.  Follow-up again in 3 months

## 2020-12-31 NOTE — Assessment & Plan Note (Signed)
Patient has had breakdown of the transverse arch and has had synovitis of the first toe.  Patient is doing better at this time.  No longer needs the carbon fiber plate but I do feel that with patient having a custom orthotics now greater than 86 years old he should be fitted for new ones.  Patient will be referred accordingly.  Hopefully this will help some of the knee as well.  Follow-up with me again in 3 months

## 2021-01-02 ENCOUNTER — Encounter: Payer: Self-pay | Admitting: Family Medicine

## 2021-01-02 ENCOUNTER — Other Ambulatory Visit: Payer: Self-pay

## 2021-01-02 ENCOUNTER — Ambulatory Visit: Payer: Medicare Other | Admitting: Family Medicine

## 2021-01-02 DIAGNOSIS — M216X1 Other acquired deformities of right foot: Secondary | ICD-10-CM | POA: Diagnosis not present

## 2021-01-02 DIAGNOSIS — M659 Synovitis and tenosynovitis, unspecified: Secondary | ICD-10-CM | POA: Diagnosis not present

## 2021-01-02 NOTE — Progress Notes (Signed)
ARDEAN MELROY - 86 y.o. male MRN [id number]  Date of birth: 22-Sep-1934  SUBJECTIVE:  Including CC & ROS.  No chief complaint on file.   TAVAUGHN SILGUERO is a 86 y.o. male that is presenting with knee pain and degenerative change of the first MTP joint.   Review of Systems See HPI   HISTORY: Past Medical, Surgical, Social, and Family History Reviewed & Updated per EMR.   Pertinent Historical Findings include:  Past Medical History:  Diagnosis Date   Allergy    seasonal- mostly spring   Anemia 02/03/2011   ASTHMA, CHILDHOOD 03/17/2010   mostly as child   BCC (basal cell carcinoma) 12/08/2011   CAROTID ARTERY OCCLUSION, WITH INFARCTION 03/17/2010   CEREBROVASCULAR ACCIDENT 03/17/2010   partial loss of peripheral vision on left-"slight improved"   CHICKENPOX, HX OF 03/17/2010   Constipation 12/08/2014   Epistaxis 09/06/2013   FATIGUE 03/17/2010   Hearing loss 11/04/2010   bilateral   HYPERKALEMIA 05/27/2010   Hyperlipidemia    Hypertension    Hypothyroid 12/08/2011   Mixed hyperlipidemia 03/17/2010   Oral lesion 11/20/2014   Overweight(278.02) 07/01/2010   Peripheral neuropathy 08/18/2016   Personal history of other infectious and parasitic disease 03/17/2010   Preventative health care 08/23/2016   Skin lesion of right arm 11/20/2014   Sleep apnea    uses mouthpiece only   Thrombocytopenia ([place]) 04/05/2012   TOBACCO ABUSE, HX OF 03/17/2010   Tubular adenoma of colon 06/10/2011    Past Surgical History:  Procedure Laterality Date   CARDIOVERSION N/A 08/11/2019   Procedure: CARDIOVERSION;  Surgeon: Josue Hector, MD;  Location: [facility] ENDOSCOPY;  Service: Cardiovascular;  Laterality: N/A;   CAROTID ENDARTERECTOMY Left February 13, 2013   cea   CATARACT EXTRACTION, BILATERAL Right    Cataract, and stigmatism   COLONOSCOPY  Dec. 2014   ENDARTERECTOMY Left 02/13/2013   Procedure: ENDARTERECTOMY CAROTID;  Surgeon: Rosetta Posner, MD;  Location: [place];  Service: Vascular;  Laterality: Left;    EUS N/A 08/17/2013   Procedure: UPPER ENDOSCOPIC ULTRASOUND (EUS) LINEAR;  Surgeon: Milus Banister, MD;  Location: [facility] ENDOSCOPY;  Service: Endoscopy;  Laterality: N/A;   knee cartliage repair  1964, 1990   bilateral   PILONIDAL CYST EXCISION  1968   removed   ROTATOR CUFF REPAIR     right   TONSILLECTOMY     UPPER GI ENDOSCOPY  Jan. 2015    Family History  Problem Relation Age of Onset   Alzheimer's disease Mother    Emphysema Sister        cigarettes   Cancer Sister        lung, tobacco    Heart disease Son    Coronary artery disease Brother    Heart attack Brother    Other Brother        heart problems- from fathers side   Cancer Brother 76       lung? smoker   Heart disease Brother 101       Heart disease before age 72   Cancer Sister        gyn   Heart disease Sister    Bipolar disorder Daughter    Obesity Daughter    Stroke Father    Heart disease Sister    Esophageal cancer Neg Hx    Colon cancer Neg Hx    Pancreatic cancer Neg Hx    Stomach cancer Neg Hx     Social History  Socioeconomic History   Marital status: Divorced    Spouse name: Not on file   Number of children: 3   Years of education: 89yrs   Highest education level: Bachelor's degree (e.g., BA, AB, BS)  Occupational History   Occupation: retired    Fish farm manager: RETIRED  Tobacco Use   Smoking status: Former    Packs/day: 2.00    Years: 20.00    Pack years: 40.00    Types: Cigarettes    Quit date: 07/19/1977    Years since quitting: 43.4   Smokeless tobacco: Never  Vaping Use   Vaping Use: Never used  Substance and Sexual Activity   Alcohol use: Yes    Alcohol/week: 5.0 standard drinks    Types: 2 Cans of beer, 2 Glasses of wine, 1 Shots of liquor per week    Comment: dinner wine daily   Drug use: No   Sexual activity: Never  Other Topics Concern   Not on file  Social History Narrative   Pt lives at home alone.    Caffeine Use: very little   Heart healthy diet, is planning  exercise      Retired from Event organiser   Left handed   Social Determinants of Health   Financial Resource Strain: Not on file  Food Insecurity: Not on file  Transportation Needs: Not on file  Physical Activity: Not on file  Stress: Not on file  Social Connections: Not on file  Intimate Partner Violence: Not on file     PHYSICAL EXAM:  VS: BP (!) 150/80 (BP Location: Right Arm, Patient Position: Sitting, Cuff Size: Large)   Ht 6\' 2"  (1.88 m)   Wt 212 lb (96.2 kg)   BMI 27.22 kg/m  Physical Exam Gen: NAD, alert, cooperative with exam, well-appearing MSK:  Right and left foot: Hallux valgus bilaterally. Bunionette on the left foot. Well maintain longitudinal arch. Loss of the transverse arch. Neurovascularly intact  Patient was fitted for a standard, cushioned, semi-rigid orthotic. The orthotic was heated and afterward the patient stood on the orthotic blank positioned on the orthotic stand. The patient was positioned in subtalar neutral position and 10 degrees of ankle dorsiflexion in a weight bearing stance. After completion of molding, a stable base was applied to the orthotic blank. The blank was ground to a stable position for weight bearing. Size: 13 Pairs: 2 Base: Blue EVA Additional Posting and Padding: First ray post bilaterally and metatarsal pads The patient ambulated these, and they were very comfortable.   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 -Counseled on home exercise therapy and supportive care. -Orthotics -Metatarsal pads placed  Synovitis of toe Acute on chronic in nature. -Counseled on home exercise therapy and supportive care. -Orthotics. -First ray post placed bilaterally.

## 2021-01-02 NOTE — Assessment & Plan Note (Signed)
Acute on chronic in nature. -Counseled on home exercise therapy and supportive care. -Orthotics. -First ray post placed bilaterally.

## 2021-01-02 NOTE — Assessment & Plan Note (Signed)
Acute on chronic in nature. -Counseled on home exercise therapy and supportive care. -Orthotics -Metatarsal pads placed

## 2021-02-20 ENCOUNTER — Other Ambulatory Visit: Payer: Self-pay

## 2021-02-20 ENCOUNTER — Encounter: Payer: Self-pay | Admitting: Pulmonary Disease

## 2021-02-20 ENCOUNTER — Ambulatory Visit: Payer: Medicare Other | Admitting: Pulmonary Disease

## 2021-02-20 VITALS — BP 120/74 | HR 50 | Temp 97.2°F | Ht 74.0 in | Wt 217.8 lb

## 2021-02-20 DIAGNOSIS — Z9989 Dependence on other enabling machines and devices: Secondary | ICD-10-CM | POA: Diagnosis not present

## 2021-02-20 DIAGNOSIS — G4733 Obstructive sleep apnea (adult) (pediatric): Secondary | ICD-10-CM | POA: Diagnosis not present

## 2021-02-20 NOTE — Progress Notes (Signed)
PEARSON WIDMEYER    [id number]    1935-03-27  Primary Care Physician:Blyth, Bonnita Levan, MD  Referring Physician: [name], MD [address] [place],  [place] [address]  Chief complaint:   Patient with a history of obstructive sleep apnea diagnosed with mild obstructive sleep apnea in 2014 Follow-up for obstructive sleep apnea  HPI: Continues to use CPAP on a regular basis He does feel like his sleep occasionally does not feel as restorative Does have some mask leaks  Continues to feel well generally Has no difficulty using CPAP on a nightly basis  Historically He has been using an oral device since 2014 Device was upgraded about 4 years after initial start date He has had multiple evaluations recently with concerns that the oral device was not working to treat his sleep disordered breathing He stated he has had at least 4 studies with the oral device in place Dr. Ron Parker did get concerned about the device not been very efficient in treating his sleep disordered breathing  He has a history of CVAs, coronary artery disease  Usually goes to bed between 10 and 11:30 PM, takes him about 20 minutes to fall asleep Wakes up about 2-4 times during the night Final wake up time between 7 and 8 AM  He is concerned he may not be able to tolerate CPAP therapy   Outpatient Encounter Medications as of 02/20/2021  Medication Sig   acetaminophen (TYLENOL) 500 MG tablet Take 1,000 mg by mouth as needed for moderate pain or headache.    aspirin EC 81 MG tablet Take 81 mg by mouth every evening.    chlorthalidone (HYGROTON) 25 MG tablet TAKE 1 TABLET BY MOUTH IN  THE MORNING   levothyroxine (SYNTHROID) 88 MCG tablet TAKE 1 TABLET BY MOUTH  DAILY BEFORE BREAKFAST   lubiprostone (AMITIZA) 8 MCG capsule Take 1 capsule (8 mcg total) by mouth 2 (two) times daily with a meal. D/c linzess   metoprolol succinate (TOPROL XL) 25 MG 24 hr tablet Take 0.5 tablets (12.5 mg total)  by mouth daily.   pantoprazole (PROTONIX) 40 MG tablet TAKE 1 TABLET BY MOUTH  DAILY   ramipril (ALTACE) 10 MG capsule TAKE 1 CAPSULE BY MOUTH  TWICE DAILY   rosuvastatin (CRESTOR) 40 MG tablet TAKE 1 TABLET BY MOUTH IN  THE EVENING   XARELTO 20 MG TABS tablet TAKE 1 TABLET BY MOUTH  DAILY WITH SUPPER   No facility-administered encounter medications on file as of 02/20/2021.    Allergies as of 02/20/2021   (No Known Allergies)    Past Medical History:  Diagnosis Date   Allergy    seasonal- mostly spring   Anemia 02/03/2011   ASTHMA, CHILDHOOD 03/17/2010   mostly as child   BCC (basal cell carcinoma) 12/08/2011   CAROTID ARTERY OCCLUSION, WITH INFARCTION 03/17/2010   CEREBROVASCULAR ACCIDENT 03/17/2010   partial loss of peripheral vision on left-"slight improved"   CHICKENPOX, HX OF 03/17/2010   Constipation 12/08/2014   Epistaxis 09/06/2013   FATIGUE 03/17/2010   Hearing loss 11/04/2010   bilateral   HYPERKALEMIA 05/27/2010   Hyperlipidemia    Hypertension    Hypothyroid 12/08/2011   Mixed hyperlipidemia 03/17/2010   Oral lesion 11/20/2014   Overweight(278.02) 07/01/2010   Peripheral neuropathy 08/18/2016   Personal history of other infectious and parasitic disease 03/17/2010   Preventative health care 08/23/2016   Skin lesion of right arm 11/20/2014  Sleep apnea    uses mouthpiece only   Thrombocytopenia ([place]) 04/05/2012   TOBACCO ABUSE, HX OF 03/17/2010   Tubular adenoma of colon 06/10/2011    Past Surgical History:  Procedure Laterality Date   CARDIOVERSION N/A 08/11/2019   Procedure: CARDIOVERSION;  Surgeon: Josue Hector, MD;  Location: [facility] ENDOSCOPY;  Service: Cardiovascular;  Laterality: N/A;   CAROTID ENDARTERECTOMY Left February 13, 2013   cea   CATARACT EXTRACTION, BILATERAL Right    Cataract, and stigmatism   COLONOSCOPY  Dec. 2014   ENDARTERECTOMY Left 02/13/2013   Procedure: ENDARTERECTOMY CAROTID;  Surgeon: Rosetta Posner, MD;  Location: [place];  Service: Vascular;   Laterality: Left;   EUS N/A 08/17/2013   Procedure: UPPER ENDOSCOPIC ULTRASOUND (EUS) LINEAR;  Surgeon: Milus Banister, MD;  Location: [facility] ENDOSCOPY;  Service: Endoscopy;  Laterality: N/A;   knee cartliage repair  1964, 1990   bilateral   PILONIDAL CYST EXCISION  1968   removed   ROTATOR CUFF REPAIR     right   TONSILLECTOMY     UPPER GI ENDOSCOPY  Jan. 2015    Family History  Problem Relation Age of Onset   Alzheimer's disease Mother    Emphysema Sister        cigarettes   Cancer Sister        lung, tobacco    Heart disease Son    Coronary artery disease Brother    Heart attack Brother    Other Brother        heart problems- from fathers side   Cancer Brother 35       lung? smoker   Heart disease Brother 74       Heart disease before age 71   Cancer Sister        gyn   Heart disease Sister    Bipolar disorder Daughter    Obesity Daughter    Stroke Father    Heart disease Sister    Esophageal cancer Neg Hx    Colon cancer Neg Hx    Pancreatic cancer Neg Hx    Stomach cancer Neg Hx     Social History   Socioeconomic History   Marital status: Divorced    Spouse name: Not on file   Number of children: 3   Years of education: 33yr   Highest education level: Bachelor's degree (e.g., BA, AB, BS)  Occupational History   Occupation: retired    EF[employer] farm manager RETIRED  Tobacco Use   Smoking status: Former    Packs/day: 2.00    Years: 20.00    Pack years: 40.00    Types: Cigarettes    Quit date: 07/19/1977    Years since quitting: 43.6   Smokeless tobacco: Never  Vaping Use   Vaping Use: Never used  Substance and Sexual Activity   Alcohol use: Yes    Alcohol/week: 5.0 standard drinks    Types: 2 Cans of beer, 2 Glasses of wine, 1 Shots of liquor per week    Comment: dinner wine daily   Drug use: No   Sexual activity: Never  Other Topics Concern   Not on file  Social History Narrative   Pt lives at home alone.    Caffeine Use: very little   Heart healthy  diet, is planning exercise      Retired from lEvent organiser  Left handed   Social Determinants of Health   Financial Resource Strain: Not on file  Food Insecurity: Not on  file  Transportation Needs: Not on file  Physical Activity: Not on file  Stress: Not on file  Social Connections: Not on file  Intimate Partner Violence: Not on file    Review of Systems  Constitutional:  Negative for fatigue.  HENT: Negative.    Respiratory:  Positive for apnea.   Cardiovascular: Negative.   Gastrointestinal: Negative.   Psychiatric/Behavioral:  Positive for sleep disturbance.    Vitals:   02/20/21 0954  BP: 120/74  Pulse: (!) 50  Temp: (!) 97.2 F (36.2 C)  SpO2: 98%    Physical Exam Constitutional:      Appearance: Normal appearance. He is well-developed.  Eyes:     General:        Right eye: No discharge.        Left eye: No discharge.     Pupils: Pupils are equal, round, and reactive to light.  Neck:     Thyroid: No thyromegaly.     Trachea: No tracheal deviation.  Cardiovascular:     Rate and Rhythm: Normal rate and regular rhythm.  Pulmonary:     Effort: Pulmonary effort is normal. No respiratory distress.     Breath sounds: Normal breath sounds. No wheezing or rales.  Musculoskeletal:     Cervical back: Normal range of motion and neck supple.  Neurological:     Mental Status: He is alert.  Psychiatric:        Mood and Affect: Mood normal.   Results of the Epworth flowsheet 05/17/2019  Sitting and reading 2  Watching TV 1  Sitting, inactive in a public place (e.g. a theatre or a meeting) 0  As a passenger in a car for an hour without a break 1  Lying down to rest in the afternoon when circumstances permit 1  Sitting and talking to someone 0  Sitting quietly after a lunch without alcohol 0  In a car, while stopped for a few minutes in traffic 0  Total score 5   Data Reviewed: Previous sleep study from 2014 reviewed showing mild obstructive sleep  apnea Pressure was changed from last visit from 5-15 Currently 10-18  100% compliance Average use of 7 hours 23 minutes Set between 10 and 18 He does have some leaks Residual AHI of 5.4  Assessment:  .  Mild obstructive sleep apnea -Adequately treated with CPAP -Continues to benefit from CPAP use  .  Pathophysiology of sleep disordered breathing discussed with the patient  .  Treatment options for sleep disordered breathing discussed with the patient  .  History of coronary artery disease  .  History of CVA  Plan/Recommendations:  .  Continue CPAP on a nightly basis  .  Continue current pressure of 10-18  .  Regular exercises as tolerated  .  Follow-up in a year  Sherrilyn Rist MD [facility] 02/20/2021, 10:07 AM  CC: [name], MD

## 2021-02-20 NOTE — Patient Instructions (Addendum)
Continue using CPAP on a nightly basis  Call with significant concerns  Graded exercises as tolerated on a regular basis  Follow-up in a year

## 2021-02-25 LAB — HM DIABETES EYE EXAM

## 2021-03-10 ENCOUNTER — Other Ambulatory Visit: Payer: Self-pay | Admitting: Internal Medicine

## 2021-03-10 ENCOUNTER — Other Ambulatory Visit: Payer: Self-pay | Admitting: Family Medicine

## 2021-03-27 ENCOUNTER — Other Ambulatory Visit: Payer: Self-pay | Admitting: Internal Medicine

## 2021-03-27 ENCOUNTER — Other Ambulatory Visit: Payer: Self-pay | Admitting: Family Medicine

## 2021-03-31 NOTE — Progress Notes (Signed)
[name] [name] [address] [place] Phone: [phone] Subjective:   [name], am serving as a scribe for Dr. Hulan Saas. This visit occurred during the SARS-CoV-2 public health emergency.  Safety protocols were in place, including screening questions prior to the visit, additional usage of staff PPE, and extensive cleaning of exam room while observing appropriate contact time as indicated for disinfecting solutions.   I'm seeing this patient by the request  of:  [name], MD  CC: Foot pain and knee pain  [id number]  12/31/2020 Patient responded very well to the viscosupplementation provided by another provider.  Discussed with patient about icing regimen and home exercises, patient will continue to stay active.  Hopefully patient will continue to do well for multiple months.  Follow-up again in 3 months  Patient has had breakdown of the transverse arch and has had synovitis of the first toe.  Patient is doing better at this time.  No longer needs the carbon fiber plate but I do feel that with patient having a custom orthotics now greater than 33 years old he should be fitted for new ones.  Patient will be referred accordingly.  Hopefully this will help some of the knee as well.  Follow-up with me again in 3 months  Update 04/01/2021 Tanner [name] is a 86 y.o. male coming in with complaint of knee and foot pain. Orthotics made on 01/02/2021. Patient states his feet are doing much better. His knee was doing better after the injection, but has slowly started to wear off. He would like to speak about his inserts. Patient feels like they were not positioned correctly.  Having some difficulty and feels like his old ones seem to be better.      Past Medical History:  Diagnosis Date   Allergy    seasonal- mostly spring   Anemia 02/03/2011   ASTHMA, CHILDHOOD 03/17/2010   mostly as child   BCC (basal cell carcinoma) 12/08/2011    CAROTID ARTERY OCCLUSION, WITH INFARCTION 03/17/2010   CEREBROVASCULAR ACCIDENT 03/17/2010   partial loss of peripheral vision on left-"slight improved"   CHICKENPOX, HX OF 03/17/2010   Constipation 12/08/2014   Epistaxis 09/06/2013   FATIGUE 03/17/2010   Hearing loss 11/04/2010   bilateral   HYPERKALEMIA 05/27/2010   Hyperlipidemia    Hypertension    Hypothyroid 12/08/2011   Mixed hyperlipidemia 03/17/2010   Oral lesion 11/20/2014   Overweight(278.02) 07/01/2010   Peripheral neuropathy 08/18/2016   Personal history of other infectious and parasitic disease 03/17/2010   Preventative health care 08/23/2016   Skin lesion of right arm 11/20/2014   Sleep apnea    uses mouthpiece only   Thrombocytopenia ([place]) 04/05/2012   TOBACCO ABUSE, HX OF 03/17/2010   Tubular adenoma of colon 06/10/2011   Past Surgical History:  Procedure Laterality Date   CARDIOVERSION N/A 08/11/2019   Procedure: CARDIOVERSION;  Surgeon: Josue Hector, MD;  Location: [facility] ENDOSCOPY;  Service: Cardiovascular;  Laterality: N/A;   CAROTID ENDARTERECTOMY Left February 13, 2013   cea   CATARACT EXTRACTION, BILATERAL Right    Cataract, and stigmatism   COLONOSCOPY  Dec. 2014   ENDARTERECTOMY Left 02/13/2013   Procedure: ENDARTERECTOMY CAROTID;  Surgeon: Rosetta Posner, MD;  Location: [place];  Service: Vascular;  Laterality: Left;   EUS N/A 08/17/2013   Procedure: UPPER ENDOSCOPIC ULTRASOUND (EUS) LINEAR;  Surgeon: Milus Banister, MD;  Location: [facility] ENDOSCOPY;  Service: Endoscopy;  Laterality:  N/A;   knee cartliage repair  1964, 1990   bilateral   PILONIDAL CYST EXCISION  1968   removed   ROTATOR CUFF REPAIR     right   TONSILLECTOMY     UPPER GI ENDOSCOPY  Jan. 2015   Social History   Socioeconomic History   Marital status: Divorced    Spouse name: Not on file   Number of children: 3   Years of education: 99yr   Highest education level: Bachelor's degree (e.g., BA, AB, BS)  Occupational History   Occupation: retired     EF[employer] farm manager RETIRED  Tobacco Use   Smoking status: Former    Packs/day: 2.00    Years: 20.00    Pack years: 40.00    Types: Cigarettes    Quit date: 07/19/1977    Years since quitting: 43.7   Smokeless tobacco: Never  Vaping Use   Vaping Use: Never used  Substance and Sexual Activity   Alcohol use: Yes    Alcohol/week: 5.0 standard drinks    Types: 2 Cans of beer, 2 Glasses of wine, 1 Shots of liquor per week    Comment: dinner wine daily   Drug use: No   Sexual activity: Never  Other Topics Concern   Not on file  Social History Narrative   Pt lives at home alone.    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 smoker   Heart disease Brother 560      Heart disease before age 84  Cancer Sister        gyn   Heart disease Sister    Bipolar disorder Daughter    Obesity Daughter    Stroke Father    Heart disease Sister    Esophageal cancer Neg Hx    Colon cancer Neg Hx    Pancreatic cancer Neg Hx    Stomach cancer Neg Hx     Current Outpatient Medications (Endocrine & Metabolic):    levothyroxine (SYNTHROID) 88 MCG tablet, TAKE 1 TABLET BY MOUTH  DAILY BEFORE BREAKFAST  Current Outpatient Medications (Cardiovascular):    chlorthalidone (HYGROTON) 25 MG tablet, TAKE 1 TABLET BY MOUTH IN  THE MORNING   metoprolol succinate  (TOPROL-XL) 25 MG 24 hr tablet, TAKE ONE-HALF TABLET BY  MOUTH DAILY   ramipril (ALTACE) 10 MG capsule, TAKE 1 CAPSULE BY MOUTH  TWICE DAILY   rosuvastatin (CRESTOR) 40 MG tablet, TAKE 1 TABLET BY MOUTH IN  THE EVENING   Current Outpatient Medications (Analgesics):    acetaminophen (TYLENOL) 500 MG tablet, Take 1,000 mg by mouth as needed for moderate pain  or headache.    aspirin EC 81 MG tablet, Take 81 mg by mouth every evening.   Current Outpatient Medications (Hematological):    XARELTO 20 MG TABS tablet, TAKE 1 TABLET BY MOUTH  DAILY WITH SUPPER  Current Outpatient Medications (Other):    lubiprostone (AMITIZA) 8 MCG capsule, Take 1 capsule (8 mcg total) by mouth daily with breakfast. Please schedule a yearly follow up for further refills. Thank you   pantoprazole (PROTONIX) 40 MG tablet, TAKE 1 TABLET BY MOUTH  DAILY     Review of Systems:  No headache, visual changes, nausea, vomiting, diarrhea, constipation, dizziness, abdominal pain, skin rash, fevers, chills, night sweats, weight loss, swollen lymph nodes, body aches, j chest pain, shortness of breath, mood changes. POSITIVE muscle aches, joint swelling  Objective  Blood pressure (!) 142/76, pulse 66, height '6\' 2"'$  (1.88 m), weight 216 lb (98 kg), SpO2 95 %.   General: No apparent distress alert and oriented x3 mood and affect normal, dressed appropriately.  HEENT: Pupils equal, extraocular movements intact  Respiratory: Patient's speak in full sentences and does not appear short of breath  Cardiovascular: No lower extremity edema, non tender, no erythema  Gait normal with good balance and coordination.  MSK: Patient's right knee does have arthritic changes.  Trace effusion noted.  Lacks the last 10 degrees of flexion. Foot exam still shows the breakdown of the transverse arch bilaterally.  Patient does have some hallux limitus noted bilaterally.  Mild overpronation of the hindfoot bilaterally right greater than left     Impression and Recommendations:    The above documentation has been reviewed and is accurate and complete Lyndal Pulley, DO

## 2021-04-01 ENCOUNTER — Encounter: Payer: Self-pay | Admitting: Family Medicine

## 2021-04-01 ENCOUNTER — Other Ambulatory Visit: Payer: Self-pay

## 2021-04-01 ENCOUNTER — Ambulatory Visit: Payer: Medicare Other | Admitting: Family Medicine

## 2021-04-01 DIAGNOSIS — M659 Synovitis and tenosynovitis, unspecified: Secondary | ICD-10-CM

## 2021-04-01 DIAGNOSIS — M1711 Unilateral primary osteoarthritis, right knee: Secondary | ICD-10-CM | POA: Diagnosis not present

## 2021-04-01 NOTE — Patient Instructions (Signed)
Good to see you! Mad the changes to orthotics. Slowly increase wear.  We will watch the knee See you again in 2 months

## 2021-04-01 NOTE — Assessment & Plan Note (Signed)
Patient is doing relatively well.  Did have the viscosupplementation previously at the very beginning of the year and would be due potentially in November if necessary.  Patient wants to hold off at this time and continue with the conservative therapy.  Patient will continue to work on the muscle stabilization.  Follow-up again in 4 to 8 weeks

## 2021-04-01 NOTE — Assessment & Plan Note (Signed)
Completely resolved at this time.  Feels like he is making progress.  No significant changes in management.  Did make some adjustments to patient's feet in the plantar fasciitis as well as the breakdown of the transverse arch.  Patient feels like it did make some improvement.  Patient will follow up with me again in 6 to 8 weeks otherwise.

## 2021-04-07 ENCOUNTER — Other Ambulatory Visit (HOSPITAL_BASED_OUTPATIENT_CLINIC_OR_DEPARTMENT_OTHER): Payer: Self-pay

## 2021-04-09 ENCOUNTER — Ambulatory Visit: Payer: Medicare Other

## 2021-04-15 ENCOUNTER — Other Ambulatory Visit: Payer: Self-pay

## 2021-04-15 ENCOUNTER — Ambulatory Visit (INDEPENDENT_AMBULATORY_CARE_PROVIDER_SITE_OTHER): Payer: Medicare Other

## 2021-04-15 DIAGNOSIS — I10 Essential (primary) hypertension: Secondary | ICD-10-CM

## 2021-04-15 DIAGNOSIS — E039 Hypothyroidism, unspecified: Secondary | ICD-10-CM | POA: Diagnosis not present

## 2021-04-15 DIAGNOSIS — R7989 Other specified abnormal findings of blood chemistry: Secondary | ICD-10-CM

## 2021-04-15 DIAGNOSIS — R739 Hyperglycemia, unspecified: Secondary | ICD-10-CM

## 2021-04-15 DIAGNOSIS — E782 Mixed hyperlipidemia: Secondary | ICD-10-CM

## 2021-04-15 LAB — COMPREHENSIVE METABOLIC PANEL
ALT: 16 U/L (ref 0–53)
AST: 18 U/L (ref 0–37)
Albumin: 4.4 g/dL (ref 3.5–5.2)
Alkaline Phosphatase: 59 U/L (ref 39–117)
BUN: 20 mg/dL (ref 6–23)
CO2: 31 mEq/L (ref 19–32)
Calcium: 10 mg/dL (ref 8.4–10.5)
Chloride: 100 mEq/L (ref 96–112)
Creatinine, Ser: 1.22 mg/dL (ref 0.40–1.50)
GFR: 54.54 mL/min — ABNORMAL LOW (ref >60.00)
Glucose, Bld: 88 mg/dL (ref 70–99)
Potassium: 4.1 mEq/L (ref 3.5–5.1)
Sodium: 139 mEq/L (ref 135–145)
Total Bilirubin: 1.1 mg/dL (ref 0.2–1.2)
Total Protein: 6.9 g/dL (ref 6.0–8.3)

## 2021-04-15 LAB — LIPID PANEL
Cholesterol: 162 mg/dL (ref 0–200)
HDL: 54.8 mg/dL (ref >39.00)
LDL Cholesterol: 90 mg/dL (ref 0–99)
NonHDL: 106.97
Total CHOL/HDL Ratio: 3
Triglycerides: 87 mg/dL (ref 0.0–149.0)
VLDL: 17.4 mg/dL (ref 0.0–40.0)

## 2021-04-15 LAB — HEMOGLOBIN A1C: Hgb A1c MFr Bld: 5.7 % (ref 4.6–6.5)

## 2021-04-15 LAB — TSH: TSH: 2.69 u[IU]/mL (ref 0.35–5.50)

## 2021-04-15 LAB — VITAMIN D 25 HYDROXY (VIT D DEFICIENCY, FRACTURES): VITD: 37.55 ng/mL (ref 30.00–100.00)

## 2021-04-16 ENCOUNTER — Telehealth: Payer: Self-pay

## 2021-04-16 ENCOUNTER — Other Ambulatory Visit (HOSPITAL_BASED_OUTPATIENT_CLINIC_OR_DEPARTMENT_OTHER): Payer: Self-pay

## 2021-04-16 ENCOUNTER — Ambulatory Visit: Payer: Medicare Other | Attending: Internal Medicine

## 2021-04-16 DIAGNOSIS — Z23 Encounter for immunization: Secondary | ICD-10-CM

## 2021-04-16 MED ORDER — COVID-19MRNA BIVAL VAC MODERNA 50 MCG/0.5ML IM SUSP
INTRAMUSCULAR | 0 refills | Status: DC
  Filled 2021-04-16: qty 0.5, 1d supply, fill #0

## 2021-04-16 NOTE — Progress Notes (Signed)
   Covid-19 Vaccination Clinic  Name:  [name]    MRN: [id number] DOB: September 06, 1934  04/16/2021  [name] was observed post Covid-19 immunization for 15 minutes without incident. He was provided with Vaccine Information Sheet and instruction to access the V-Safe system.   [name] was instructed to call 911 with any severe reactions post vaccine: Difficulty breathing  Swelling of face and throat  A fast heartbeat  A bad rash all over body  Dizziness and weakness

## 2021-04-16 NOTE — Telephone Encounter (Signed)
Pt came to office yesterday to have labs drawn. Received call from lab that CBC could not be read because of platelet clumps. Do you want me to contact pt and have CBC redrawn?

## 2021-04-17 ENCOUNTER — Ambulatory Visit: Payer: Medicare Other | Admitting: Family Medicine

## 2021-04-17 ENCOUNTER — Encounter: Payer: Self-pay | Admitting: Family Medicine

## 2021-04-17 ENCOUNTER — Other Ambulatory Visit: Payer: Self-pay

## 2021-04-17 VITALS — BP 132/68 | HR 74 | Temp 98.3°F | Resp 16 | Wt 215.2 lb

## 2021-04-17 DIAGNOSIS — Z23 Encounter for immunization: Secondary | ICD-10-CM | POA: Diagnosis not present

## 2021-04-17 DIAGNOSIS — E039 Hypothyroidism, unspecified: Secondary | ICD-10-CM

## 2021-04-17 DIAGNOSIS — R351 Nocturia: Secondary | ICD-10-CM

## 2021-04-17 DIAGNOSIS — E782 Mixed hyperlipidemia: Secondary | ICD-10-CM

## 2021-04-17 DIAGNOSIS — R7989 Other specified abnormal findings of blood chemistry: Secondary | ICD-10-CM | POA: Diagnosis not present

## 2021-04-17 DIAGNOSIS — D649 Anemia, unspecified: Secondary | ICD-10-CM

## 2021-04-17 DIAGNOSIS — D696 Thrombocytopenia, unspecified: Secondary | ICD-10-CM

## 2021-04-17 DIAGNOSIS — R739 Hyperglycemia, unspecified: Secondary | ICD-10-CM

## 2021-04-17 DIAGNOSIS — I10 Essential (primary) hypertension: Secondary | ICD-10-CM

## 2021-04-17 DIAGNOSIS — Z8673 Personal history of transient ischemic attack (TIA), and cerebral infarction without residual deficits: Secondary | ICD-10-CM

## 2021-04-17 LAB — VITAMIN B12: Vitamin B-12: 1225 pg/mL — ABNORMAL HIGH (ref 211–911)

## 2021-04-17 NOTE — Telephone Encounter (Signed)
noted 

## 2021-04-17 NOTE — Assessment & Plan Note (Signed)
Supplement and monitor 

## 2021-04-17 NOTE — Assessment & Plan Note (Signed)
Tolerating statin, encouraged heart healthy diet, avoid trans fats, minimize simple carbs and saturated fats. Increase exercise as tolerated 

## 2021-04-17 NOTE — Assessment & Plan Note (Addendum)
Improved with last blood check, no new symptoms

## 2021-04-17 NOTE — Assessment & Plan Note (Signed)
hgba1c acceptable, minimize simple carbs. Increase exercise as tolerated.  

## 2021-04-17 NOTE — Progress Notes (Signed)
Patient ID: Tanner [name], male    DOB: 1934-12-18  Age: 86 y.o. MRN: [id number]    Subjective:   Chief Complaint  Patient presents with   Follow-up    Pt states that he is having fatigue.    Subjective   HPI Tanner [name] presents for office visit today for follow up on HTN and afib. Within the last 6-7 months he has been consistently feeling more fatigue than his usual baseline. He states that he feels just tired, but denies SOB. He has not experienced this kind of fatigue before and he is concerned about his consistently lower O2 levels of 93%. Event hought his normal is usually within 98%. Denies CP/palp/SOB/HA/congestion/fevers/GI or GU c/o. Taking meds as prescribed.  He plans on getting a new set of hearing aids, since he is not happy with his current ones.  Review of Systems  Constitutional:  Positive for fatigue. Negative for chills and fever.  HENT:  Negative for congestion, rhinorrhea, sinus pressure, sinus pain and sore throat.   Eyes:  Negative for pain.  Respiratory:  Negative for cough and shortness of breath.   Cardiovascular:  Negative for chest pain, palpitations and leg swelling.  Gastrointestinal:  Negative for abdominal pain, blood in stool, diarrhea, nausea and vomiting.  Genitourinary:  Negative for flank pain, frequency and penile pain.  Musculoskeletal:  Negative for back pain.  Neurological:  Negative for headaches.   History Past Medical History:  Diagnosis Date   Allergy    seasonal- mostly spring   Anemia 02/03/2011   ASTHMA, CHILDHOOD 03/17/2010   mostly as child   BCC (basal cell carcinoma) 12/08/2011   CAROTID ARTERY OCCLUSION, WITH INFARCTION 03/17/2010   CEREBROVASCULAR ACCIDENT 03/17/2010   partial loss of peripheral vision on left-"slight improved"   CHICKENPOX, HX OF 03/17/2010   Constipation 12/08/2014   Epistaxis 09/06/2013   FATIGUE 03/17/2010   Hearing loss 11/04/2010   bilateral   HYPERKALEMIA 05/27/2010   Hyperlipidemia     Hypertension    Hypothyroid 12/08/2011   Mixed hyperlipidemia 03/17/2010   Oral lesion 11/20/2014   Overweight(278.02) 07/01/2010   Peripheral neuropathy 08/18/2016   Personal history of other infectious and parasitic disease 03/17/2010   Preventative health care 08/23/2016   Skin lesion of right arm 11/20/2014   Sleep apnea    uses mouthpiece only   Thrombocytopenia ([place]) 04/05/2012   TOBACCO ABUSE, HX OF 03/17/2010   Tubular adenoma of colon 06/10/2011    He has a past surgical history that includes Tonsillectomy; Pilonidal cyst excision (1968); knee cartliage repair (1964, 1990); Rotator cuff repair; Endarterectomy (Left, 02/13/2013); EUS (N/A, 08/17/2013); Carotid endarterectomy (Left, February 13, 2013); Colonoscopy (Dec. 2014); Upper gi endoscopy (Jan. 2015); Cataract extraction, bilateral (Right); and Cardioversion (N/A, 08/11/2019).   His family history includes Alzheimer's disease in his mother; Bipolar disorder in his daughter; Cancer in his sister and sister; Cancer (age of onset: 48) in his brother; Coronary artery disease in his brother; Emphysema in his sister; Heart attack in his brother; Heart disease in his sister, sister, and son; Heart disease (age of onset: 29) in his brother; Obesity in his daughter; Other in his brother; Stroke in his father.He reports that he quit smoking about 43 years ago. His smoking use included cigarettes. He has a 40.00 pack-year smoking history. He has never used smokeless tobacco. He reports current alcohol use of about 5.0 standard drinks per week. He reports that he does not use drugs.  Current Outpatient Medications on  File Prior to Visit  Medication Sig Dispense Refill   acetaminophen (TYLENOL) 500 MG tablet Take 1,000 mg by mouth as needed for moderate pain or headache.      aspirin EC 81 MG tablet Take 81 mg by mouth every evening.      chlorthalidone (HYGROTON) 25 MG tablet TAKE 1 TABLET BY MOUTH IN  THE MORNING 90 tablet 3   levothyroxine (SYNTHROID) 88  MCG tablet TAKE 1 TABLET BY MOUTH  DAILY BEFORE BREAKFAST 90 tablet 3   lubiprostone (AMITIZA) 8 MCG capsule Take 1 capsule (8 mcg total) by mouth daily with breakfast. Please schedule a yearly follow up for further refills. Thank you 180 capsule 0   metoprolol succinate (TOPROL-XL) 25 MG 24 hr tablet TAKE ONE-HALF TABLET BY  MOUTH DAILY 45 tablet 1   pantoprazole (PROTONIX) 40 MG tablet TAKE 1 TABLET BY MOUTH  DAILY 90 tablet 3   ramipril (ALTACE) 10 MG capsule TAKE 1 CAPSULE BY MOUTH  TWICE DAILY 180 capsule 3   rosuvastatin (CRESTOR) 40 MG tablet TAKE 1 TABLET BY MOUTH IN  THE EVENING 90 tablet 3   XARELTO 20 MG TABS tablet TAKE 1 TABLET BY MOUTH  DAILY WITH SUPPER 90 tablet 1   No current facility-administered medications on file prior to visit.     Objective:  Objective  Physical Exam Constitutional:      General: He is not in acute distress.    Appearance: Normal appearance. He is not ill-appearing or toxic-appearing.  HENT:     Head: Normocephalic and atraumatic.     Right Ear: Tympanic membrane, ear canal and external ear normal.     Left Ear: Tympanic membrane, ear canal and external ear normal.     Nose: No congestion or rhinorrhea.  Eyes:     Extraocular Movements: Extraocular movements intact.     Pupils: Pupils are equal, round, and reactive to light.  Cardiovascular:     Rate and Rhythm: Normal rate and regular rhythm.     Pulses: Normal pulses.     Heart sounds: Normal heart sounds. No murmur heard. Pulmonary:     Effort: Pulmonary effort is normal. No respiratory distress.     Breath sounds: Normal breath sounds. No wheezing, rhonchi or rales.  Abdominal:     General: Bowel sounds are normal.     Palpations: Abdomen is soft. There is no mass.     Tenderness: There is no abdominal tenderness. There is no guarding.     Hernia: No hernia is present.  Musculoskeletal:        General: Normal range of motion.     Cervical back: Normal range of motion and neck supple.   Skin:    General: Skin is warm and dry.  Neurological:     Mental Status: He is alert and oriented to person, place, and time.  Psychiatric:        Behavior: Behavior normal.   BP 132/68   Pulse 74   Temp 98.3 F (36.8 C)   Resp 16   Wt 215 lb 3.2 oz (97.6 kg)   SpO2 93%   BMI 27.63 kg/m  Wt Readings from Last 3 Encounters:  04/17/21 215 lb 3.2 oz (97.6 kg)  04/01/21 216 lb (98 kg)  02/20/21 217 lb 12.8 oz (98.8 kg)     Lab Results  Component Value Date   WBC 6.3 10/24/2020   HGB 13.5 10/24/2020   HCT 39.8 10/24/2020   PLT 166.0 10/24/2020  GLUCOSE 88 04/15/2021   CHOL 162 04/15/2021   TRIG 87.0 04/15/2021   HDL 54.80 04/15/2021   LDLCALC 90 04/15/2021   ALT 16 04/15/2021   AST 18 04/15/2021   NA 139 04/15/2021   K 4.1 04/15/2021   CL 100 04/15/2021   CREATININE 1.22 04/15/2021   BUN 20 04/15/2021   CO2 31 04/15/2021   TSH 2.69 04/15/2021   PSA 0.39 05/08/2014   INR 0.99 02/07/2013   HGBA1C 5.7 04/15/2021   MICROALBUR 2.0 (H) 12/03/2015    No results found.   Assessment & Plan:  Plan    No orders of the defined types were placed in this encounter.   Problem List Items Addressed This Visit     Hypothyroid (Chronic)    On Levothyroxine, continue to monitor      Relevant Orders   TSH   Mixed hyperlipidemia    Tolerating statin, encouraged heart healthy diet, avoid trans fats, minimize simple carbs and saturated fats. Increase exercise as tolerated      Relevant Orders   Lipid panel   Essential hypertension, benign    Well controlled, no changes to meds. Encouraged heart healthy diet such as the DASH diet and exercise as tolerated.       Relevant Orders   CBC   Comprehensive metabolic panel   H/O: CVA (cerebrovascular accident)    No recent changes       Anemia   Relevant Orders   CBC   Vitamin B12 (Completed)   Vitamin B12   Thrombocytopenia (HCC)    Improved with last blood check, no new symptoms      Relevant Orders    Vitamin B12 (Completed)   Vitamin B12   Hyperglycemia    hgba1c acceptable, minimize simple carbs. Increase exercise as tolerated.       Relevant Orders   Hemoglobin A1c   Low vitamin D level    Supplement and monitor      Relevant Orders   VITAMIN D 25 Hydroxy (Vit-D Deficiency, Fractures)   Other Visit Diagnoses     Nocturia    -  Primary   Relevant Orders   PSA   Need for influenza vaccination       Relevant Orders   Flu Vaccine QUAD High Dose(Fluad) (Completed)       Follow-up: Return in about 6 months (around 10/15/2021), or lab appt on 3/29  then CPE after that.  I, Suezanne Jacquet, acting as a scribe for Penni Homans, MD, have documented all relevent documentation on behalf of Penni Homans, MD, as directed by Penni Homans, MD while in the presence of Penni Homans, MD. DO:04/18/21.  I, [name], MD personally performed the services described in this documentation. All medical record entries made by the scribe were at my direction and in my presence. I have reviewed the chart and agree that the record reflects my personal performance and is accurate and complete

## 2021-04-17 NOTE — Assessment & Plan Note (Signed)
No recent changes

## 2021-04-17 NOTE — Patient Instructions (Signed)
Tanner [name] for medical EKG for evaluation.     Atrial Fibrillation Atrial fibrillation is a type of irregular or rapid heartbeat (arrhythmia). In atrial fibrillation, the top part of the heart (atria) beats in an irregular pattern. This makes the heart unable to pump blood normally and effectively. The goal of treatment is to prevent blood clots from forming, control your heart rate, or restore your heartbeat to a normal rhythm. If this condition is not treated, it can cause serious problems, such as a weakened heart muscle (cardiomyopathy) or a stroke. What are the causes? This condition is often caused by medical conditions that damage the heart's electrical system. These include: High blood pressure (hypertension). This is the most common cause. Certain heart problems or conditions, such as heart failure, coronary artery disease, heart valve problems, or heart surgery. Diabetes. Overactive thyroid (hyperthyroidism). Obesity. Chronic kidney disease. In some cases, the cause of this condition is not known. What increases the risk? This condition is more likely to develop in: Older people. People who smoke. Athletes who do endurance exercise. People who have a family history of atrial fibrillation. Men. People who use drugs. People who drink a lot of alcohol. People who have lung conditions, such as emphysema, pneumonia, or COPD. People who have obstructive sleep apnea. What are the signs or symptoms? Symptoms of this condition include: A feeling that your heart is racing or beating irregularly. Discomfort or pain in your chest. Shortness of breath. Sudden light-headedness or weakness. Tiring easily during exercise or activity. Fatigue. Syncope (fainting). Sweating. In some cases, there are no symptoms. How is this diagnosed? Your health care provider may detect atrial fibrillation when taking your pulse. If detected, this condition may be diagnosed with: An  electrocardiogram (ECG) to check electrical signals of the heart. An ambulatory cardiac monitor to record your heart's activity for a few days. A transthoracic echocardiogram (TTE) to create pictures of your heart. A transesophageal echocardiogram (TEE) to create even closer pictures of your heart. A stress test to check your blood supply while you exercise. Imaging tests, such as a CT scan or chest X-ray. Blood tests. How is this treated? Treatment depends on underlying conditions and how you feel when you experience atrial fibrillation. This condition may be treated with: Medicines to prevent blood clots or to treat heart rate or heart rhythm problems. Electrical cardioversion to reset the heart's rhythm. A pacemaker to correct abnormal heart rhythm. Ablation to remove the heart tissue that sends abnormal signals. Left atrial appendage closure to seal the area where blood clots can form. In some cases, underlying conditions will be treated. Follow these instructions at home: Medicines Take over-the counter and prescription medicines only as told by your health care provider. Do not take any new medicines without talking to your health care provider. If you are taking blood thinners: Talk with your health care provider before you take any medicines that contain aspirin or NSAIDs, such as ibuprofen. These medicines increase your risk for dangerous bleeding. Take your medicine exactly as told, at the same time every day. Avoid activities that could cause injury or bruising, and follow instructions about how to prevent falls. Wear a medical alert bracelet or carry a card that lists what medicines you take. Lifestyle   Do not use any products that contain nicotine or tobacco, such as cigarettes, e-cigarettes, and chewing tobacco. If you need help quitting, ask your health care provider. Eat heart-healthy foods. Talk with a dietitian to make an eating plan that  is right for you. Exercise  regularly as told by your health care provider. Do not drink alcohol. Lose weight if you are overweight. Do not use drugs, including cannabis. General instructions If you have obstructive sleep apnea, manage your condition as told by your health care provider. Do not use diet pills unless your health care provider approves. Diet pills can make heart problems worse. Keep all follow-up visits as told by your health care provider. This is important. Contact a health care provider if you: Notice a change in the rate, rhythm, or strength of your heartbeat. Are taking a blood thinner and you notice more bruising. Tire more easily when you exercise or do heavy work. Have a sudden change in weight. Get help right away if you have:  Chest pain, abdominal pain, sweating, or weakness. Trouble breathing. Side effects of blood thinners, such as blood in your vomit, stool, or urine, or bleeding that cannot stop. Any symptoms of a stroke. "BE FAST" is an easy way to remember the main warning signs of a stroke: B - Balance. Signs are dizziness, sudden trouble walking, or loss of balance. E - Eyes. Signs are trouble seeing or a sudden change in vision. F - Face. Signs are sudden weakness or numbness of the face, or the face or eyelid drooping on one side. A - Arms. Signs are weakness or numbness in an arm. This happens suddenly and usually on one side of the body. S - Speech. Signs are sudden trouble speaking, slurred speech, or trouble understanding what people say. T - Time. Time to call emergency services. Write down what time symptoms started. Other signs of a stroke, such as: A sudden, severe headache with no known cause. Nausea or vomiting. Seizure. These symptoms may represent a serious problem that is an emergency. Do not wait to see if the symptoms will go away. Get medical help right away. Call your local emergency services (911 in the [place] Do not drive yourself to the  hospital. Summary Atrial fibrillation is a type of irregular or rapid heartbeat (arrhythmia). Symptoms include a feeling that your heart is beating fast or irregularly. You may be given medicines to prevent blood clots or to treat heart rate or heart rhythm problems. Get help right away if you have signs or symptoms of a stroke. Get help right away if you cannot catch your breath or have chest pain or pressure. This information is not intended to replace advice given to you by your health care provider. Make sure you discuss any questions you have with your health care provider. Document Revised: 12/28/2018 Document Reviewed: 12/28/2018 Elsevier Patient Education  [place].

## 2021-04-17 NOTE — Assessment & Plan Note (Signed)
On Levothyroxine, continue to monitor 

## 2021-04-17 NOTE — Assessment & Plan Note (Signed)
Well controlled, no changes to meds. Encouraged heart healthy diet such as the DASH diet and exercise as tolerated.  °

## 2021-04-18 ENCOUNTER — Other Ambulatory Visit: Payer: Self-pay

## 2021-04-18 DIAGNOSIS — D696 Thrombocytopenia, unspecified: Secondary | ICD-10-CM

## 2021-04-19 ENCOUNTER — Encounter: Payer: Self-pay | Admitting: Family Medicine

## 2021-04-21 ENCOUNTER — Other Ambulatory Visit (HOSPITAL_BASED_OUTPATIENT_CLINIC_OR_DEPARTMENT_OTHER): Payer: Self-pay

## 2021-04-22 ENCOUNTER — Other Ambulatory Visit: Payer: Self-pay

## 2021-04-22 ENCOUNTER — Ambulatory Visit (INDEPENDENT_AMBULATORY_CARE_PROVIDER_SITE_OTHER): Payer: Medicare Other

## 2021-04-22 DIAGNOSIS — D696 Thrombocytopenia, unspecified: Secondary | ICD-10-CM

## 2021-04-22 LAB — CBC WITH DIFFERENTIAL/PLATELET
Basophils Absolute: 0 10*3/uL (ref 0.0–0.1)
Basophils Relative: 0.7 % (ref 0.0–3.0)
Eosinophils Absolute: 0.4 10*3/uL (ref 0.0–0.7)
Eosinophils Relative: 5.8 % — ABNORMAL HIGH (ref 0.0–5.0)
HCT: 41.5 % (ref 39.0–52.0)
Hemoglobin: 13.7 g/dL (ref 13.0–17.0)
Lymphocytes Relative: 30.5 % (ref 12.0–46.0)
Lymphs Abs: 2 10*3/uL (ref 0.7–4.0)
MCHC: 33.1 g/dL (ref 30.0–36.0)
MCV: 96.6 fl (ref 78.0–100.0)
Monocytes Absolute: 0.6 10*3/uL (ref 0.1–1.0)
Monocytes Relative: 9.6 % (ref 3.0–12.0)
Neutro Abs: 3.5 10*3/uL (ref 1.4–7.7)
Neutrophils Relative %: 53.4 % (ref 43.0–77.0)
Platelets: 116 10*3/uL — ABNORMAL LOW (ref 150.0–400.0)
RBC: 4.3 Mil/uL (ref 4.22–5.81)
RDW: 14.5 % (ref 11.5–15.5)
WBC: 7.1 10*3/uL (ref 4.0–10.5)

## 2021-04-22 NOTE — Progress Notes (Signed)
Per orders of Dr. [name], patient was seen to have bloodwork done. Patient tolerated blood draw well. One blue top and lavender top tube was used, draw done in left arm in the median cubital vein. Sw, cma

## 2021-04-25 ENCOUNTER — Telehealth: Payer: Self-pay | Admitting: *Deleted

## 2021-04-25 ENCOUNTER — Other Ambulatory Visit: Payer: Self-pay | Admitting: *Deleted

## 2021-04-25 DIAGNOSIS — D696 Thrombocytopenia, unspecified: Secondary | ICD-10-CM

## 2021-04-25 NOTE — Telephone Encounter (Signed)
Per Dr. [name] if patient has to have purple top draw, we must draw two.  Patient had to come back many for redraws because his blood clumps quickly.

## 2021-05-09 NOTE — Progress Notes (Signed)
CARDIOLOGY CONSULT NOTE       Patient ID: Tanner [name] MRN: [id number] DOB/AGE: [birth year] 84 y.o.  Admit date: (Not on file) Referring Physician: [name] Primary Physician: [name], MD Primary Cardiologist: [name] Cancel Reason for Consultation: Afib  Active Problems:   * No active hospital problems. *   HPI:  86 y.o. referred by Dr [name] for new onset afib in 2020 .  CRF;s quit smoking in 2011, HTN, HLD vascular disease with CVA 2011, hypothyroidism and OSA using a mouth piece and not CPAP CHADVASC 6 on xarelto Had [facility] 08/11/19 and has maintained NSR   TTE 07/18/20 :  EF 60-65% mild AS mean gradient 7 mmhg mild MR LAE 42 mm Myovue normal in 07/19/19 no ischemia Carotid 05/23/20:  slight recanilaztion of RICA patient left CEA no stenosis   This patients CHA2DS2-VASc Score and unadjusted Ischemic Stroke Rate (% per year) is equal to 9.7 % stroke rate/year from a score of 6   Wife died about a year and a half ago There were divorced but good friends Retired Animal nutritionist. Two children   No cardiac complaints   ROS All other systems reviewed and negative except as noted above  Past Medical History:  Diagnosis Date   Allergy    seasonal- mostly spring   Anemia 02/03/2011   ASTHMA, CHILDHOOD 03/17/2010   mostly as child   BCC (basal cell carcinoma) 12/08/2011   CAROTID ARTERY OCCLUSION, WITH INFARCTION 03/17/2010   CEREBROVASCULAR ACCIDENT 03/17/2010   partial loss of peripheral vision on left-"slight improved"   CHICKENPOX, HX OF 03/17/2010   Constipation 12/08/2014   Epistaxis 09/06/2013   FATIGUE 03/17/2010   Hearing loss 11/04/2010   bilateral   HYPERKALEMIA 05/27/2010   Hyperlipidemia    Hypertension    Hypothyroid 12/08/2011   Mixed hyperlipidemia 03/17/2010   Oral lesion 11/20/2014   Overweight(278.02) 07/01/2010   Peripheral neuropathy 08/18/2016   Personal history of other infectious and parasitic disease 03/17/2010   Preventative health care 08/23/2016    Skin lesion of right arm 11/20/2014   Sleep apnea    uses mouthpiece only   Thrombocytopenia ([place]) 04/05/2012   TOBACCO ABUSE, HX OF 03/17/2010   Tubular adenoma of colon 06/10/2011    Family History  Problem Relation Age of Onset   Alzheimer's disease Mother    Emphysema Sister        cigarettes   Cancer Sister        lung, tobacco    Heart disease Son    Coronary artery disease Brother    Heart attack Brother    Other Brother        heart problems- from fathers side   Cancer Brother 67       lung? smoker   Heart disease Brother 73       Heart disease before age 36   Cancer Sister        gyn   Heart disease Sister    Bipolar disorder Daughter    Obesity Daughter    Stroke Father    Heart disease Sister    Esophageal cancer Neg Hx    Colon cancer Neg Hx    Pancreatic cancer Neg Hx    Stomach cancer Neg Hx     Social History   Socioeconomic History   Marital status: Divorced    Spouse name: Not on file   Number of children: 3   Years of education: 106yrs   Highest education level: Bachelor's  degree (e.g., BA, AB, BS)  Occupational History   Occupation: retired    Fish farm manager: RETIRED  Tobacco Use   Smoking status: Former    Packs/day: 2.00    Years: 20.00    Pack years: 40.00    Types: Cigarettes    Quit date: 07/19/1977    Years since quitting: 43.8   Smokeless tobacco: Never  Vaping Use   Vaping Use: Never used  Substance and Sexual Activity   Alcohol use: Yes    Alcohol/week: 5.0 standard drinks    Types: 2 Cans of beer, 2 Glasses of wine, 1 Shots of liquor per week    Comment: dinner wine daily   Drug use: No   Sexual activity: Never  Other Topics Concern   Not on file  Social History Narrative   Pt lives at home alone.    Caffeine Use: very little   Heart healthy diet, is planning exercise      Retired from Event organiser   Left handed   Social Determinants of Health   Financial Resource Strain: Not on file  Food Insecurity: Not on file   Transportation Needs: Not on file  Physical Activity: Not on file  Stress: Not on file  Social Connections: Not on file  Intimate Partner Violence: Not on file    Past Surgical History:  Procedure Laterality Date   CARDIOVERSION N/A 08/11/2019   Procedure: CARDIOVERSION;  Surgeon: Josue Hector, MD;  Location: [facility];  Service: Cardiovascular;  Laterality: N/A;   CAROTID ENDARTERECTOMY Left February 13, 2013   cea   CATARACT EXTRACTION, BILATERAL Right    Cataract, and stigmatism   COLONOSCOPY  Dec. 2014   ENDARTERECTOMY Left 02/13/2013   Procedure: ENDARTERECTOMY CAROTID;  Surgeon: Rosetta Posner, MD;  Location: [facility];  Service: Vascular;  Laterality: Left;   EUS N/A 08/17/2013   Procedure: UPPER ENDOSCOPIC ULTRASOUND (EUS) LINEAR;  Surgeon: Milus Banister, MD;  Location: [facility];  Service: Endoscopy;  Laterality: N/A;   knee cartliage repair  1964, 1990   bilateral   PILONIDAL CYST EXCISION  1968   removed   ROTATOR CUFF REPAIR     right   TONSILLECTOMY     UPPER GI ENDOSCOPY  Jan. 2015        Physical Exam: Blood pressure 126/74, pulse 67, height 6\' 2"  (1.88 m), weight 218 lb (98.9 kg), SpO2 99 %.   Affect appropriate Healthy:  appears stated age 53: normal Neck supple with no adenopathy JVP normal left CEA no bruits no thyromegaly Lungs clear with no wheezing and good diaphragmatic motion Heart:  S1/S2 AS murmur, no rub, gallop or click PMI normal Abdomen: benighn, BS positve, no tenderness, no AAA no bruit.  No HSM or HJR Distal pulses intact with no bruits No edema Neuro old left visual field defect  Skin warm and dry No muscular weakness   Labs:   Lab Results  Component Value Date   WBC 7.1 04/22/2021   HGB 13.7 04/22/2021   HCT 41.5 04/22/2021   MCV 96.6 04/22/2021   PLT 116.0 (L) 04/22/2021   No results for input(s): NA, K, CL, CO2, BUN, CREATININE, CALCIUM, PROT, BILITOT, ALKPHOS, ALT, AST, GLUCOSE in the last 168 hours.  Invalid  input(s): LABALBU Lab Results  Component Value Date   CKTOTAL 95 03/10/2010   CKMB 3.6 03/10/2010   TROPONINI 0.01        NO INDICATION OF MYOCARDIAL INJURY. 03/10/2010    Lab Results  Component Value Date   CHOL 162 04/15/2021   CHOL 141 10/24/2020   CHOL 140 07/05/2020   Lab Results  Component Value Date   HDL 54.80 04/15/2021   HDL 44.90 10/24/2020   HDL 53.80 07/05/2020   Lab Results  Component Value Date   LDLCALC 90 04/15/2021   LDLCALC 74 10/24/2020   LDLCALC 72 07/05/2020   Lab Results  Component Value Date   TRIG 87.0 04/15/2021   TRIG 112.0 10/24/2020   TRIG 68.0 07/05/2020   Lab Results  Component Value Date   CHOLHDL 3 04/15/2021   CHOLHDL 3 10/24/2020   CHOLHDL 3 07/05/2020   No results found for: LDLDIRECT    Radiology: No results found.  EKG: afib rate 66    ASSESSMENT AND PLAN:   1. AFib:  NSR post [facility] 08/11/19  continue Toprol and Xarelto 2. HTN:  Well controlled.  Continue current medications and low sodium Dash type diet.   3. AS/MR:  both mild by TTE  07/18/20  4. Thyroid: continue current dose of synthroid TSH normal  5. HDL  Continue high dose crestor LDL at goal  6. CVA: carotids stable plavix d/c due to need for anticoagulation continue low dose ASA 81 mg Duplex 05/23/20 with no left sided dx post CEA and minor recanalization of previously occluded right ICA  Carotid Duplex  F/U in a year   Signed: Jenkins Rouge 05/23/2021, 8:25 AM

## 2021-05-20 ENCOUNTER — Other Ambulatory Visit: Payer: Self-pay | Admitting: *Deleted

## 2021-05-20 DIAGNOSIS — I6529 Occlusion and stenosis of unspecified carotid artery: Secondary | ICD-10-CM

## 2021-05-23 ENCOUNTER — Encounter: Payer: Self-pay | Admitting: Cardiovascular Disease

## 2021-05-23 ENCOUNTER — Ambulatory Visit: Payer: Medicare Other | Admitting: Cardiovascular Disease

## 2021-05-23 ENCOUNTER — Other Ambulatory Visit: Payer: Self-pay

## 2021-05-23 VITALS — BP 126/74 | HR 67 | Ht 74.0 in | Wt 218.0 lb

## 2021-05-23 DIAGNOSIS — I350 Nonrheumatic aortic (valve) stenosis: Secondary | ICD-10-CM

## 2021-05-23 DIAGNOSIS — I6522 Occlusion and stenosis of left carotid artery: Secondary | ICD-10-CM

## 2021-05-23 DIAGNOSIS — I480 Paroxysmal atrial fibrillation: Secondary | ICD-10-CM | POA: Diagnosis not present

## 2021-05-23 NOTE — Patient Instructions (Addendum)
Medication Instructions:  NO CHA NGES *If you need a refill on your cardiac medications before your next appointment, please call your pharmacy*   Lab Work: NONE If you have labs (blood work) drawn today and your tests are completely normal, you will receive your results only by: [place] (if you have MyChart) OR A paper copy in the mail If you have any lab test that is abnormal or we need to change your treatment, we will call you to review the results.   Testing/Procedures: CAROTID SCHEDULED   Follow-Up: At [facility], you and your health needs are our priority.  As part of our continuing mission to provide you with exceptional heart care, we have created designated Provider Care Teams.  These Care Teams include your primary Cardiologist (physician) and Advanced Practice Providers (APPs -  Physician Assistants and Nurse Practitioners) who all work together to provide you with the care you need, when you need it.  We recommend signing up for the patient portal called "MyChart".  Sign up information is provided on this After Visit Summary.  MyChart is used to connect with patients for Virtual Visits (Telemedicine).  Patients are able to view lab/test results, encounter notes, upcoming appointments, etc.  Non-urgent messages can be sent to your provider as well.   To learn more about what you can do with MyChart, go to [email or web address].    Your next appointment:   12 month(s)  The format for your next appointment:   In Person  Provider:    DR [name] Cancel      Other Instructions NONE

## 2021-05-27 ENCOUNTER — Other Ambulatory Visit: Payer: Self-pay

## 2021-05-27 ENCOUNTER — Ambulatory Visit: Payer: Medicare Other | Admitting: Physician Assistant

## 2021-05-27 ENCOUNTER — Ambulatory Visit (HOSPITAL_COMMUNITY)
Admission: RE | Admit: 2021-05-27 | Discharge: 2021-05-27 | Disposition: A | Discharge status: Home / Self Care | Payer: Medicare Other | Source: Ambulatory Visit | Attending: Vascular Surgery | Admitting: Vascular Surgery

## 2021-05-27 VITALS — BP 154/80 | HR 61 | Temp 97.3°F | Resp 20 | Ht 74.0 in | Wt 217.0 lb

## 2021-05-27 DIAGNOSIS — I6521 Occlusion and stenosis of right carotid artery: Secondary | ICD-10-CM

## 2021-05-27 DIAGNOSIS — I6529 Occlusion and stenosis of unspecified carotid artery: Secondary | ICD-10-CM | POA: Diagnosis present

## 2021-05-27 DIAGNOSIS — I6522 Occlusion and stenosis of left carotid artery: Secondary | ICD-10-CM | POA: Diagnosis not present

## 2021-05-27 NOTE — Progress Notes (Addendum)
Carotid Artery Follow-Up   VASCULAR SURGERY ASSESSMENT & PLAN:   Tanner [name] is a 86 y.o. male who presents for routine surveillance.  He underwent left carotid endarterectomy by [name] on February 13, 2013.  Prior to his surgery, he experienced left amaurosis fugax and was evaluated by his ophthalmologist who immediately sent him to [facility] neurologic for evaluation.  He was found at that time to have occlusion of his right ICA.  He ultimately required left carotid endarterectomy for severe asymptomatic stenosis.  He had no complications.  Bilateral carotid artery stenosis: The patient has no symptoms referable to carotid artery stenosis.  Duplex examination today is stable as compared to 1 year ago.  We reviewed the signs and symptoms of stroke/TIA and advised the patient to call EMS should these occur.   Continue optimal medical management of hypertension and follow-up with primary care physician. Encouraged continuation of complete smoking cessation. Continue the following medications: Statin and aspirin Follow-up in 1 year with carotid duplex ultrasound.  SUBJECTIVE:   The patient denies monocular blindness, slurred speech, facial drooping, extremity weakness or numbness.  PHYSICAL EXAM:   Vitals:   05/27/21 1034 05/27/21 1036  BP: (!) 153/77 (!) 154/80  Pulse: 61   Resp: 20   Temp: (!) 97.3 F (36.3 C)   TempSrc: Temporal   SpO2: 99%   Weight: 217 lb (98.4 kg)   Height: 6\' 2"  (1.88 m)     General appearance: Well-developed, well-nourished in no apparent distress Neurologic: Alert and oriented x4, face symmetric, speech fluent, 5 out of 5 bilateral upper extremity grip strength, triceps and biceps strength.  5 out of 5 bilateral quadricep and hamstring strength.  Ambulates unaided.  No ataxia Cardiovascular: Heart rate and rhythm are regular.  Pedal pulses are palpable.  No carotid bruits. Respirations: Nonlabored Lungs: Clear to auscultation  bilaterally  NON-INVASIVE VASCULAR STUDIES  05/27/2021  Summary:  Right Carotid: Trickle flow in the CCA. Occluded proximal ECA with  retrograde                 filling of the mid ECA via a branch. Occluded ICA.   Left Carotid: Velocities in the left ICA are consistent with a 1-39%  stenosis.   Vertebrals:  Bilateral vertebral arteries demonstrate antegrade flow.  Subclavians: Right subclavian artery flow was disturbed. Normal flow               hemodynamics were seen in the left subclavian artery.   *See table(s) above for measurements and observations.       Preliminary  02/2021    PROBLEM LIST:    The patient's past medical history, past surgical history, family history, social history, allergy list and medication list are reviewed.  He is maintained on rivaroxaban secondary to history of atrial fibrillation.  He is compliant with daily statin and aspirin.   CURRENT MEDS:    Current Outpatient Medications:    acetaminophen (TYLENOL) 500 MG tablet, Take 1,000 mg by mouth as needed for moderate pain or headache. , Disp: , Rfl:    aspirin EC 81 MG tablet, Take 81 mg by mouth every evening. , Disp: , Rfl:    chlorthalidone (HYGROTON) 25 MG tablet, TAKE 1 TABLET BY MOUTH IN  THE MORNING, Disp: 90 tablet, Rfl: 3   levothyroxine (SYNTHROID) 88 MCG tablet, TAKE 1 TABLET BY MOUTH  DAILY BEFORE BREAKFAST, Disp: 90 tablet, Rfl: 3   lubiprostone (AMITIZA) 8 MCG capsule, Take 1 capsule (8 mcg  total) by mouth daily with breakfast. Please schedule a yearly follow up for further refills. Thank you, Disp: 180 capsule, Rfl: 0   metoprolol succinate (TOPROL-XL) 25 MG 24 hr tablet, TAKE ONE-HALF TABLET BY  MOUTH DAILY, Disp: 45 tablet, Rfl: 1   pantoprazole (PROTONIX) 40 MG tablet, TAKE 1 TABLET BY MOUTH  DAILY, Disp: 90 tablet, Rfl: 3   ramipril (ALTACE) 10 MG capsule, TAKE 1 CAPSULE BY MOUTH  TWICE DAILY, Disp: 180 capsule, Rfl: 3   rosuvastatin (CRESTOR) 40 MG tablet, TAKE 1 TABLET BY MOUTH  IN  THE EVENING, Disp: 90 tablet, Rfl: 3   XARELTO 20 MG TABS tablet, TAKE 1 TABLET BY MOUTH  DAILY WITH SUPPER, Disp: 90 tablet, Rfl: 1   REVIEW OF SYSTEMS:   [X]  denotes positive finding, [ ]  denotes negative finding Cardiac  Comments:  Chest pain or chest pressure:    Shortness of breath upon exertion:    Short of breath when lying flat:    Irregular heart rhythm:        Vascular    Pain in calf, thigh, or hip brought on by ambulation:    Pain in feet at night that wakes you up from your sleep:     Blood clot in your veins:    Leg swelling:         Pulmonary    Oxygen at home:    Productive cough:     Wheezing:         Neurologic    Sudden weakness in arms or legs:     Sudden numbness in arms or legs:     Sudden onset of difficulty speaking or slurred speech:    Temporary loss of vision in one eye:     Problems with dizziness:         Gastrointestinal    Blood in stool:     Vomited blood:         Genitourinary    Burning when urinating:     Blood in urine:        Psychiatric    Major depression:         Hematologic    Bleeding problems:    Problems with blood clotting too easily:        Skin    Rashes or ulcers:        Constitutional    Fever or chills:     Barbie Banner, PA-C  Office: [phone] 05/27/2021 Dr. Stanford Breed

## 2021-05-28 ENCOUNTER — Other Ambulatory Visit (INDEPENDENT_AMBULATORY_CARE_PROVIDER_SITE_OTHER): Payer: Medicare Other

## 2021-05-28 ENCOUNTER — Other Ambulatory Visit: Payer: Self-pay | Admitting: Cardiovascular Disease

## 2021-05-28 ENCOUNTER — Other Ambulatory Visit: Payer: Self-pay | Admitting: Family Medicine

## 2021-05-28 DIAGNOSIS — D696 Thrombocytopenia, unspecified: Secondary | ICD-10-CM

## 2021-05-28 DIAGNOSIS — I4819 Other persistent atrial fibrillation: Secondary | ICD-10-CM

## 2021-05-28 LAB — CBC WITH DIFFERENTIAL/PLATELET
Basophils Absolute: 0.1 10*3/uL (ref 0.0–0.1)
Basophils Relative: 0.8 % (ref 0.0–3.0)
Eosinophils Absolute: 0.3 10*3/uL (ref 0.0–0.7)
Eosinophils Relative: 5.8 % — ABNORMAL HIGH (ref 0.0–5.0)
HCT: 41.2 % (ref 39.0–52.0)
Hemoglobin: 13.7 g/dL (ref 13.0–17.0)
Lymphocytes Relative: 29.9 % (ref 12.0–46.0)
Lymphs Abs: 1.8 10*3/uL (ref 0.7–4.0)
MCHC: 33.3 g/dL (ref 30.0–36.0)
MCV: 96.8 fl (ref 78.0–100.0)
Monocytes Absolute: 0.7 10*3/uL (ref 0.1–1.0)
Monocytes Relative: 11.3 % (ref 3.0–12.0)
Neutro Abs: 3.2 10*3/uL (ref 1.4–7.7)
Neutrophils Relative %: 52.2 % (ref 43.0–77.0)
Platelets: 153 10*3/uL (ref 150.0–400.0)
RBC: 4.26 Mil/uL (ref 4.22–5.81)
RDW: 14.2 % (ref 11.5–15.5)
WBC: 6 10*3/uL (ref 4.0–10.5)

## 2021-05-28 NOTE — Telephone Encounter (Signed)
Pt last saw Dr [name] Cancel 05/23/21, last labs 04/15/21 Creat 1.22, age 86, weight 98.4kg, CrCl 62.73, based on CrCl pt is on appropriate dosage of Xarelto 20mg  QD for afib.  Will refill rx.

## 2021-05-29 NOTE — Progress Notes (Signed)
Tanner [name] [address] [place] Phone: [phone] Subjective:   Tanner [name], am serving as a scribe for Dr. Hulan Saas. This visit occurred during the SARS-CoV-2 public health emergency.  Safety protocols were in place, including screening questions prior to the visit, additional usage of staff PPE, and extensive cleaning of exam room while observing appropriate contact time as indicated for disinfecting solutions.   I'm seeing this patient by the request  of:  [name], MD  CC: Right knee pain follow-up  [id number]  04/01/2021 Patient is doing relatively well.  Did have the viscosupplementation previously at the very beginning of the year and would be due potentially in November if necessary.  Patient wants to hold off at this time and continue with the conservative therapy.  Patient will continue to work on the muscle stabilization.  Follow-up again in 4 to 8 weeks  Completely resolved at this time.  Feels like he is making progress.  No significant changes in management.  Did make some adjustments to patient's feet in the plantar fasciitis as well as the breakdown of the transverse arch.  Patient feels like it did make some improvement.  Patient will follow up with me again in 6 to 8 weeks otherwise.  Update 06/03/2021 Tanner [name] is a 86 y.o. male coming in with complaint of R knee pain. Patient states knee is okay until he puts weight on it. Possible injection. Left side after sitting for a while pain in the hip runs down to the feet. More of a weakness than a numbness. Thinks it neuropathy related. Has been on and off for a while, but the feeling doesn't last long.       Past Medical History:  Diagnosis Date   Allergy    seasonal- mostly spring   Anemia 02/03/2011   ASTHMA, CHILDHOOD 03/17/2010   mostly as child   BCC (basal cell carcinoma) 12/08/2011   CAROTID ARTERY OCCLUSION, WITH INFARCTION 03/17/2010    CEREBROVASCULAR ACCIDENT 03/17/2010   partial loss of peripheral vision on left-"slight improved"   CHICKENPOX, HX OF 03/17/2010   Constipation 12/08/2014   Epistaxis 09/06/2013   FATIGUE 03/17/2010   Hearing loss 11/04/2010   bilateral   HYPERKALEMIA 05/27/2010   Hyperlipidemia    Hypertension    Hypothyroid 12/08/2011   Mixed hyperlipidemia 03/17/2010   Oral lesion 11/20/2014   Overweight(278.02) 07/01/2010   Peripheral neuropathy 08/18/2016   Personal history of other infectious and parasitic disease 03/17/2010   Preventative health care 08/23/2016   Skin lesion of right arm 11/20/2014   Sleep apnea    uses mouthpiece only   Thrombocytopenia ([place]) 04/05/2012   TOBACCO ABUSE, HX OF 03/17/2010   Tubular adenoma of colon 06/10/2011   Past Surgical History:  Procedure Laterality Date   CARDIOVERSION N/A 08/11/2019   Procedure: CARDIOVERSION;  Surgeon: Josue Hector, MD;  Location: [facility] ENDOSCOPY;  Service: Cardiovascular;  Laterality: N/A;   CAROTID ENDARTERECTOMY Left February 13, 2013   cea   CATARACT EXTRACTION, BILATERAL Right    Cataract, and stigmatism   COLONOSCOPY  Dec. 2014   ENDARTERECTOMY Left 02/13/2013   Procedure: ENDARTERECTOMY CAROTID;  Surgeon: Rosetta Posner, MD;  Location: [facility];  Service: Vascular;  Laterality: Left;   EUS N/A 08/17/2013   Procedure: UPPER ENDOSCOPIC ULTRASOUND (EUS) LINEAR;  Surgeon: Milus Banister, MD;  Location: [facility] ENDOSCOPY;  Service: Endoscopy;  Laterality: N/A;   knee cartliage repair  1964, 1990   bilateral   PILONIDAL CYST EXCISION  1968   removed   ROTATOR CUFF REPAIR     right   TONSILLECTOMY     UPPER GI ENDOSCOPY  Jan. 2015   Social History   Socioeconomic History   Marital status: Divorced    Spouse name: Not on file   Number of children: 3   Years of education: 29yrs   Highest education level: Bachelor's degree (e.g., BA, AB, BS)  Occupational History   Occupation: retired    Fish farm manager: RETIRED  Tobacco Use   Smoking status: Former     Packs/day: 2.00    Years: 20.00    Pack years: 40.00    Types: Cigarettes    Quit date: 07/19/1977    Years since quitting: 43.9   Smokeless tobacco: Never  Vaping Use   Vaping Use: Never used  Substance and Sexual Activity   Alcohol use: Yes    Alcohol/week: 5.0 standard drinks    Types: 2 Cans of beer, 2 Glasses of wine, 1 Shots of liquor per week    Comment: dinner wine daily   Drug use: No   Sexual activity: Never  Other Topics Concern   Not on file  Social History Narrative   Pt lives at home alone.    Caffeine Use: very little   Heart healthy diet, is planning exercise      Retired from Event organiser   Left handed   Social Determinants of Health   Financial Resource Strain: Not on file  Food Insecurity: Not on file  Transportation Needs: Not on file  Physical Activity: Not on file  Stress: Not on file  Social Connections: Not on file   No Known Allergies Family History  Problem Relation Age of Onset   Alzheimer's disease Mother    Emphysema Sister        cigarettes   Cancer Sister        lung, tobacco    Heart disease Son    Coronary artery disease Brother    Heart attack Brother    Other Brother        heart problems- from fathers side   Cancer Brother 21       lung? smoker   Heart disease Brother 28       Heart disease before age 28   Cancer Sister        gyn   Heart disease Sister    Bipolar disorder Daughter    Obesity Daughter    Stroke Father    Heart disease Sister    Esophageal cancer Neg Hx    Colon cancer Neg Hx    Pancreatic cancer Neg Hx    Stomach cancer Neg Hx     Current Outpatient Medications (Endocrine & Metabolic):    levothyroxine (SYNTHROID) 88 MCG tablet, TAKE 1 TABLET BY MOUTH  DAILY BEFORE BREAKFAST  Current Outpatient Medications (Cardiovascular):    chlorthalidone (HYGROTON) 25 MG tablet, TAKE 1 TABLET BY MOUTH IN  THE MORNING   metoprolol succinate (TOPROL-XL) 25 MG 24 hr tablet, TAKE ONE-HALF TABLET BY  MOUTH  DAILY   ramipril (ALTACE) 10 MG capsule, TAKE 1 CAPSULE BY MOUTH  TWICE DAILY   rosuvastatin (CRESTOR) 40 MG tablet, TAKE 1 TABLET BY MOUTH IN  THE EVENING   Current Outpatient Medications (Analgesics):    acetaminophen (TYLENOL) 500 MG tablet, Take 1,000 mg by mouth as needed for moderate pain or headache.    aspirin EC  81 MG tablet, Take 81 mg by mouth every evening.   Current Outpatient Medications (Hematological):    rivaroxaban (XARELTO) 20 MG TABS tablet, TAKE 1 TABLET BY MOUTH  DAILY WITH SUPPER  Current Outpatient Medications (Other):    lubiprostone (AMITIZA) 8 MCG capsule, Take 1 capsule (8 mcg total) by mouth daily with breakfast. Please schedule a yearly follow up for further refills. Thank you   pantoprazole (PROTONIX) 40 MG tablet, TAKE 1 TABLET BY MOUTH  DAILY   Reviewed prior external information including notes and imaging from  primary care provider As well as notes that were available from care everywhere and other healthcare systems.  Past medical history, social, surgical and family history all reviewed in electronic medical record.  No pertanent information unless stated regarding to the chief complaint.   Review of Systems:  No headache, visual changes, nausea, vomiting, diarrhea, constipation, dizziness, abdominal pain, skin rash, fevers, chills, night sweats, weight loss, swollen lymph nodes, body aches, joint swelling, chest pain, shortness of breath, mood changes. POSITIVE muscle aches  Objective  Blood pressure 130/72, pulse 61, height 6\' 2"  (1.88 m), weight 218 lb (98.9 kg), SpO2 98 %.   General: No apparent distress alert and oriented x3 mood and affect normal, dressed appropriately.  HEENT: Pupils equal, extraocular movements intact  Respiratory: Patient's speak in full sentences and does not appear short of breath  Cardiovascular: No lower extremity edema, non tender, no erythema  Gait n antalgic gait Knee: Right valgus deformity noted.  Abnormal  thigh to calf ratio.  Tender to palpation over medial and PF joint line.  ROM full in flexion and extension and lower leg rotation. instability with valgus force.  painful patellar compression. Patellar glide with moderate crepitus. Patellar and quadriceps tendons unremarkable. Hamstring and quadriceps strength is normal. Contralateral knee shows mild arthritic changes  After informed written and verbal consent, patient was seated on exam table. Right knee was prepped with alcohol swab and utilizing anterolateral approach, patient's right knee space was injected with 4:1  marcaine 0.5%: Kenalog 40mg /dL. Patient tolerated the procedure well without immediate complications.    Impression and Recommendations:     The above documentation has been reviewed and is accurate and complete Lyndal Pulley, DO

## 2021-06-03 ENCOUNTER — Ambulatory Visit (INDEPENDENT_AMBULATORY_CARE_PROVIDER_SITE_OTHER): Payer: Medicare Other

## 2021-06-03 ENCOUNTER — Ambulatory Visit: Payer: Medicare Other | Admitting: Family Medicine

## 2021-06-03 ENCOUNTER — Encounter: Payer: Self-pay | Admitting: Family Medicine

## 2021-06-03 ENCOUNTER — Other Ambulatory Visit: Payer: Self-pay

## 2021-06-03 VITALS — BP 130/72 | HR 61 | Ht 74.0 in | Wt 218.0 lb

## 2021-06-03 DIAGNOSIS — M1711 Unilateral primary osteoarthritis, right knee: Secondary | ICD-10-CM

## 2021-06-03 DIAGNOSIS — M79605 Pain in left leg: Secondary | ICD-10-CM

## 2021-06-03 NOTE — Patient Instructions (Addendum)
Xrays today Injection today We'll work on gel approval See you again in 7-8 weeks

## 2021-06-03 NOTE — Assessment & Plan Note (Signed)
Chronic problem with exacerbation.  Discussed icing regimen and home exercises.  Discussed which activities to do which wants to avoid.  Discussed potentially wearing a brace.  Patient would be a candidate for viscosupplementation and we will try to get approval after the new year.  Follow-up with me again in 6 to 8 weeks.

## 2021-06-09 NOTE — Progress Notes (Signed)
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  LDL goal less than 70 - Normotensive blood pressure - follow up with PCP - Hgb A1c goal less than 70 2 If he has another flare up of nerve pain, will have him try gabapentin 100mg  up to three times daily as needed.  Cautioned for dizziness and drowsiness. 3  Follow up one year.  Subjective:  Jaymon Dudek is an 86 year old left-handed male with atrial fibrillation, hypertension, hyperlipidemia, hypothyroidism, OSA and former smoker who follows up for right MCA/PCA watershed infarct, carotid artery disease with right ICA occlusion and s/p left CEA, and peripheral neuropathy.   UPDATE: Current medications:  Xarelto, ASA 81mg , Crestor 40mg , Altace, Amitiza, Hygroton, Synthroid   04/15/2021 LABS:  Hgb A1c 5.7, LDL 90.  Usually no nerve pain.  However, about every 6 months, he will wake up at night in bed with poking and burning in the feet, lasting up to 24 hours.  It is quite painful.  OTC ointments such as lidocaine or Capsaicin cream, are ineffective.  He has been having left hip pain radiating down the side of his leg to the ankle.  X-ray of lumbar and pelvis on 11/15 personally reviewed showed degenerative disc disease in the lumbar spine.  Sports Medicine thinks he needs new orthotics.      HISTORY: Stroke:   In August 2011, he had a stroke, presenting as left visual field cut.  He had a right hemispheric stroke thought to be secondary to right ICA occlusion.  MRI of brain revealed an acute watershed  infarct in the right PCA/MCA distributions.  CTA of the neck confirmed right common carotid artery occlusion at the origin.  The left ICA revealed approximately 50% stenosis just distal to the bulb.  CTA of the head revealed occluded right ICA with partial retrograde constitution from the left INCA via the PCA.  There was opacification of the right MCA and right ACA from the left ICA via the Acomm.  There was occlusion of the right PCA at its origin.  Hgb A1c at that time was 5.6 and LDL was 210.   He was placed on Plavix.  Follow up carotid dopplers revealed increased stenosis of the left ICA.  Carotid doppler from 01/03/13 revealed more than 70% stenosis involving the left bulb and left proximal ICA and 50-69% stenosis involving the left mid ICA, but without plaque.  Stenosis of left ECA and left VA noted.  CTA of the neck performed on 01/19/13 revealed 75-80% stenosis of the left ICA.  He underwent a left CEA on 02/13/13.     02/07/13 2D Echo:  LVEF 55-65%, no regional wall motion abnormalities, mild MVR, mildly dilated left atrium, mildly dilated right ventricle, mildly increased systolic pressure in pulCarotid doppler on 04/02/15 showed right ICA occluded.  Left ICA patent.  No plaque He followed up with vascular surgery on 04/07/16.   Carotid doppler performed then again revealed known right ICA occlusion with patent left carotid endarterectomy site, not significantly changed compared to last exam.  Carotid doppler from 05/23/19 showed recanalization of  the right CCA, ECA and retrograde via ECA recanalization of the proximal right ICA.  Patent left carotid endarterectomy site with no evidence of restenosis.  Bilateral vertebral arteries with antegrade flow.  Carotid ultrasound from 05/23/2020 showed slight recanalization of the CCA, ECA and proximal ICA and 1-39% stenosis of left ICA.   Neuropathy:   He reports numbness on the bottom of his feet, mostly his toes.  He also has a discomfort involving the left  shin, but it is not a pain.  He says she sometimes catches his toe of his left foot on the ground and may stumble.  This has been ongoing since his stroke.  He also reports having fractured his left ankle twice.  He does report crossing his legs.  He notes muscle cramps in the legs, which bother him at night.  NCV-EMG from 04/28/16 revealed chronic symmetric sensorimotor polyneuropathy.  Neuropathy labs include:  ANA negative, Sed Rate 3, B6 43.8, SPEP/IFE/UPEP negative, B12 855, TSH 2.07, ACE 5.  PAST MEDICAL HISTORY: Past Medical History:  Diagnosis Date   Allergy    seasonal- mostly spring   Anemia 02/03/2011   ASTHMA, CHILDHOOD 03/17/2010   mostly as child   BCC (basal cell carcinoma) 12/08/2011   CAROTID ARTERY OCCLUSION, WITH INFARCTION 03/17/2010   CEREBROVASCULAR ACCIDENT 03/17/2010   partial loss of peripheral vision on left-"slight improved"   CHICKENPOX, HX OF 03/17/2010   Constipation 12/08/2014   Epistaxis 09/06/2013   FATIGUE 03/17/2010   Hearing loss 11/04/2010   bilateral   HYPERKALEMIA 05/27/2010   Hyperlipidemia    Hypertension    Hypothyroid 12/08/2011   Mixed hyperlipidemia 03/17/2010   Oral lesion 11/20/2014   Overweight(278.02) 07/01/2010   Peripheral neuropathy 08/18/2016   Personal history of other infectious and parasitic disease 03/17/2010   Preventative health care 08/23/2016   Skin lesion of right arm 11/20/2014   Sleep apnea    uses mouthpiece only   Thrombocytopenia ([place]) 04/05/2012   TOBACCO ABUSE, HX OF 03/17/2010   Tubular adenoma of colon 06/10/2011    MEDICATIONS: Current Outpatient Medications on File Prior to Visit  Medication Sig Dispense Refill   acetaminophen (TYLENOL) 500 MG tablet Take 1,000 mg by mouth as needed for moderate pain or headache.      aspirin EC 81 MG tablet Take 81 mg by mouth every evening.      chlorthalidone (HYGROTON) 25 MG tablet TAKE 1 TABLET BY MOUTH IN  THE MORNING 90 tablet 3   levothyroxine (SYNTHROID) 88 MCG tablet TAKE 1 TABLET  BY MOUTH  DAILY BEFORE BREAKFAST 90 tablet 3   lubiprostone (AMITIZA) 8 MCG capsule Take 1 capsule (8 mcg total) by mouth daily with breakfast. Please schedule a yearly follow up for further refills. Thank you 180 capsule 0   metoprolol succinate (TOPROL-XL) 25 MG 24 hr tablet TAKE ONE-HALF TABLET BY  MOUTH DAILY 45 tablet 1   pantoprazole (PROTONIX) 40 MG tablet TAKE 1 TABLET BY MOUTH  DAILY 90 tablet 3   ramipril (ALTACE) 10 MG capsule TAKE 1 CAPSULE BY MOUTH  TWICE DAILY 180 capsule 3   rivaroxaban (XARELTO) 20 MG TABS tablet TAKE 1 TABLET BY MOUTH  DAILY WITH SUPPER 90 tablet 2   rosuvastatin (CRESTOR) 40 MG tablet TAKE 1 TABLET BY MOUTH IN  THE EVENING 90 tablet 3   No current facility-administered medications on file prior to visit.    ALLERGIES: No Known Allergies  FAMILY HISTORY: Family History  Problem Relation Age of Onset  Alzheimer's disease Mother    Emphysema Sister        cigarettes   Cancer Sister        lung, tobacco    Heart disease Son    Coronary artery disease Brother    Heart attack Brother    Other Brother        heart problems- from fathers side   Cancer Brother 68       lung? smoker   Heart disease Brother 74       Heart disease before age 56   Cancer Sister        gyn   Heart disease Sister    Bipolar disorder Daughter    Obesity Daughter    Stroke Father    Heart disease Sister    Esophageal cancer Neg Hx    Colon cancer Neg Hx    Pancreatic cancer Neg Hx    Stomach cancer Neg Hx       Objective:  Blood pressure (!) 166/84, pulse 64, height 5\' 10"  (1.778 m), weight 218 lb 3.2 oz (99 kg), SpO2 99 %. General: No acute distress.  Patient appears well-groomed.   Head:  Normocephalic/atraumatic Eyes:  Fundi examined but not visualized Neck: supple, no paraspinal tenderness, full range of motion Heart:  Regular rate and rhythm Lungs:  Clear to auscultation bilaterally Back: No paraspinal tenderness Neurological Exam: alert and oriented to  person, place, and time.  Speech fluent and not dysarthric, language intact.  CN II-XII intact. Bulk and tone normal, muscle strength 5/5 throughout.  Sensation to pinprick and vibration reduced in feet  Deep tendon reflexes 2+ throughout, toes downgoing.  Finger to nose testing intact.  Gait normal, Romberg negative.   Metta Clines, DO  CC: Penni Homans, MD

## 2021-06-10 ENCOUNTER — Encounter: Payer: Self-pay | Admitting: Neurology

## 2021-06-10 ENCOUNTER — Ambulatory Visit: Payer: Medicare Other | Admitting: Neurology

## 2021-06-10 ENCOUNTER — Other Ambulatory Visit: Payer: Self-pay

## 2021-06-10 VITALS — BP 166/84 | HR 64 | Ht 70.0 in | Wt 218.2 lb

## 2021-06-10 DIAGNOSIS — I63231 Cerebral infarction due to unspecified occlusion or stenosis of right carotid arteries: Secondary | ICD-10-CM

## 2021-06-10 DIAGNOSIS — G609 Hereditary and idiopathic neuropathy, unspecified: Secondary | ICD-10-CM

## 2021-06-10 DIAGNOSIS — I10 Essential (primary) hypertension: Secondary | ICD-10-CM

## 2021-06-10 DIAGNOSIS — I480 Paroxysmal atrial fibrillation: Secondary | ICD-10-CM

## 2021-06-10 DIAGNOSIS — E782 Mixed hyperlipidemia: Secondary | ICD-10-CM

## 2021-06-10 MED ORDER — GABAPENTIN 100 MG PO CAPS: 100.0000 mg | ORAL_CAPSULE | Freq: Three times a day (TID) | ORAL | 0 refills | Status: DC

## 2021-06-10 NOTE — Patient Instructions (Addendum)
If you get a flare up of nerve pain, take gabapentin 100mg .  May take up to 3 times daily as needed. Caution for sleepiness and dizziness. If ineffective, we can increase dose

## 2021-06-30 ENCOUNTER — Other Ambulatory Visit: Payer: Self-pay | Admitting: Neurology

## 2021-07-17 NOTE — Progress Notes (Signed)
[facility] [place] Phone: [phone] Subjective:   Fontaine No, am serving as a scribe for Dr. Hulan Saas. This visit occurred during the SARS-CoV-2 public health emergency.  Safety protocols were in place, including screening questions prior to the visit, additional usage of staff PPE, and extensive cleaning of exam room while observing appropriate contact time as indicated for disinfecting solutions.  I'm seeing this patient by the request  of:  [name], MD  CC: Leg and knee pain follow-up  [id number]  06/03/2021 Chronic problem with exacerbation.  Discussed icing regimen and home exercises.  Discussed which activities to do which wants to avoid.  Discussed potentially wearing a brace.  Patient would be a candidate for viscosupplementation and we will try to get approval after the new year.  Follow-up with me again in 6 to 8 weeks.  Update 07/22/2021 GEROD CALIGIURI is a 86 y.o. male coming in with complaint of L leg and R knee pain. Patient states that his leg and hip are feeling better. Walking helps his L leg but R knee pain persists.   Xray lumbar 06/03/2021 IMPRESSION: Degenerative disc and degenerative joint disease as described above. No acute fracture or listhesis.  Xray pelvis 06/03/2021 Negative     Past Medical History:  Diagnosis Date   Allergy    seasonal- mostly spring   Anemia 02/03/2011   ASTHMA, CHILDHOOD 03/17/2010   mostly as child   BCC (basal cell carcinoma) 12/08/2011   CAROTID ARTERY OCCLUSION, WITH INFARCTION 03/17/2010   CEREBROVASCULAR ACCIDENT 03/17/2010   partial loss of peripheral vision on left-"slight improved"   CHICKENPOX, HX OF 03/17/2010   Constipation 12/08/2014   Epistaxis 09/06/2013   FATIGUE 03/17/2010   Hearing loss 11/04/2010   bilateral   HYPERKALEMIA 05/27/2010   Hyperlipidemia    Hypertension    Hypothyroid 12/08/2011   Mixed hyperlipidemia 03/17/2010    Oral lesion 11/20/2014   Overweight(278.02) 07/01/2010   Peripheral neuropathy 08/18/2016   Personal history of other infectious and parasitic disease 03/17/2010   Preventative health care 08/23/2016   Skin lesion of right arm 11/20/2014   Sleep apnea    uses mouthpiece only   Thrombocytopenia ([place]) 04/05/2012   TOBACCO ABUSE, HX OF 03/17/2010   Tubular adenoma of colon 06/10/2011   Past Surgical History:  Procedure Laterality Date   CARDIOVERSION N/A 08/11/2019   Procedure: CARDIOVERSION;  Surgeon: Josue Hector, MD;  Location: [facility] ENDOSCOPY;  Service: Cardiovascular;  Laterality: N/A;   CAROTID ENDARTERECTOMY Left February 13, 2013   cea   CATARACT EXTRACTION, BILATERAL Right    Cataract, and stigmatism   COLONOSCOPY  Dec. 2014   ENDARTERECTOMY Left 02/13/2013   Procedure: ENDARTERECTOMY CAROTID;  Surgeon: Rosetta Posner, MD;  Location: [place];  Service: Vascular;  Laterality: Left;   EUS N/A 08/17/2013   Procedure: UPPER ENDOSCOPIC ULTRASOUND (EUS) LINEAR;  Surgeon: Milus Banister, MD;  Location: [facility] ENDOSCOPY;  Service: Endoscopy;  Laterality: N/A;   knee cartliage repair  1964, 1990   bilateral   PILONIDAL CYST EXCISION  1968   removed   ROTATOR CUFF REPAIR     right   TONSILLECTOMY     UPPER GI ENDOSCOPY  Jan. 2015   Social History   Socioeconomic History   Marital status: Divorced    Spouse name: Not on file   Number of children: 3   Years of education: 21yrs   Highest education  level: Bachelor's degree (e.g., BA, AB, BS)  Occupational History   Occupation: retired    Fish farm manager: RETIRED  Tobacco Use   Smoking status: Former    Packs/day: 2.00    Years: 20.00    Pack years: 40.00    Types: Cigarettes    Quit date: 07/19/1977    Years since quitting: 44.0   Smokeless tobacco: Never  Vaping Use   Vaping Use: Never used  Substance and Sexual Activity   Alcohol use: Yes    Alcohol/week: 5.0 standard drinks    Types: 2 Cans of beer, 2 Glasses of wine, 1 Shots of liquor per  week    Comment: dinner wine daily   Drug use: No   Sexual activity: Never  Other Topics Concern   Not on file  Social History Narrative   Pt lives at home alone.    Caffeine Use: very little   Heart healthy diet, is planning exercise      Retired from Event organiser   Left handed   Social Determinants of Health   Financial Resource Strain: Not on file  Food Insecurity: Not on file  Transportation Needs: Not on file  Physical Activity: Not on file  Stress: Not on file  Social Connections: Not on file   No Known Allergies Family History  Problem Relation Age of Onset   Alzheimer's disease Mother    Emphysema Sister        cigarettes   Cancer Sister        lung, tobacco    Heart disease Son    Coronary artery disease Brother    Heart attack Brother    Other Brother        heart problems- from fathers side   Cancer Brother 27       lung? smoker   Heart disease Brother 57       Heart disease before age 65   Cancer Sister        gyn   Heart disease Sister    Bipolar disorder Daughter    Obesity Daughter    Stroke Father    Heart disease Sister    Esophageal cancer Neg Hx    Colon cancer Neg Hx    Pancreatic cancer Neg Hx    Stomach cancer Neg Hx     Current Outpatient Medications (Endocrine & Metabolic):    levothyroxine (SYNTHROID) 88 MCG tablet, TAKE 1 TABLET BY MOUTH  DAILY BEFORE BREAKFAST  Current Outpatient Medications (Cardiovascular):    chlorthalidone (HYGROTON) 25 MG tablet, TAKE 1 TABLET BY MOUTH IN  THE MORNING   metoprolol succinate (TOPROL-XL) 25 MG 24 hr tablet, TAKE ONE-HALF TABLET BY  MOUTH DAILY   ramipril (ALTACE) 10 MG capsule, TAKE 1 CAPSULE BY MOUTH  TWICE DAILY   rosuvastatin (CRESTOR) 40 MG tablet, TAKE 1 TABLET BY MOUTH IN  THE EVENING   Current Outpatient Medications (Analgesics):    acetaminophen (TYLENOL) 500 MG tablet, Take 1,000 mg by mouth as needed for moderate pain or headache.    aspirin EC 81 MG tablet, Take 81 mg by  mouth every evening.   Current Outpatient Medications (Hematological):    rivaroxaban (XARELTO) 20 MG TABS tablet, TAKE 1 TABLET BY MOUTH  DAILY WITH SUPPER  Current Outpatient Medications (Other):    gabapentin (NEURONTIN) 100 MG capsule, TAKE 1 CAPSULE BY MOUTH 3 TIMES  DAILY   lubiprostone (AMITIZA) 8 MCG capsule, Take 1 capsule (8 mcg total) by mouth daily with breakfast. Please  schedule a yearly follow up for further refills. Thank you   pantoprazole (PROTONIX) 40 MG tablet, TAKE 1 TABLET BY MOUTH  DAILY   Reviewed prior external information including notes and imaging from  primary care provider As well as notes that were available from care everywhere and other healthcare systems.  Past medical history, social, surgical and family history all reviewed in electronic medical record.  No pertanent information unless stated regarding to the chief complaint.   Review of Systems:  No headache, visual changes, nausea, vomiting, diarrhea, constipation, dizziness, abdominal pain, skin rash, fevers, chills, night sweats, weight loss, swollen lymph nodes, body aches, joint swelling, chest pain, shortness of breath, mood changes. POSITIVE muscle aches  Objective  Blood pressure (!) 142/72, pulse 64, height 5\' 10"  (1.778 m), weight 221 lb (100.2 kg), SpO2 94 %.   General: No apparent distress alert and oriented x3 mood and affect normal, dressed appropriately.  HEENT: Pupils equal, extraocular movements intact  Respiratory: Patient's speak in full sentences and does not appear short of breath  Cardiovascular: No lower extremity edema, non tender, no erythema  Gait mild antalgic Right knee exam still has some instability with valgus and varus force.  Decrease in the amount of inflammation noted today.  Patient does have lacking the last 5 degrees of flexion.  After informed written and verbal consent, patient was seated on exam table. Right knee was prepped with alcohol swab and utilizing  anterolateral approach, patient's right knee space was injected with 60 mg per 3 mL of Durolane (sodium hyaluronate) in a prefilled syringe was injected easily into the knee through a 22-gauge needle..Patient tolerated the procedure well without immediate complications.   Impression and Recommendations:     The above documentation has been reviewed and is accurate and complete Lyndal Pulley, DO

## 2021-07-22 ENCOUNTER — Encounter: Payer: Self-pay | Admitting: Family Medicine

## 2021-07-22 ENCOUNTER — Ambulatory Visit: Payer: Medicare Other | Admitting: Family Medicine

## 2021-07-22 ENCOUNTER — Other Ambulatory Visit: Payer: Self-pay

## 2021-07-22 DIAGNOSIS — M1711 Unilateral primary osteoarthritis, right knee: Secondary | ICD-10-CM | POA: Diagnosis not present

## 2021-07-22 NOTE — Patient Instructions (Signed)
Durolane injection today See me in 6-8 weeks

## 2021-07-22 NOTE — Assessment & Plan Note (Signed)
Patient given injection and tolerated the procedure well.  Discussed icing regimen and home exercises.  Discussed which activities could be beneficial and which ones to avoid.  Hopefully patient will have another 65-month improvement again.  Patient can follow-up with me as needed if needed.

## 2021-08-19 ENCOUNTER — Other Ambulatory Visit: Payer: Self-pay | Admitting: Internal Medicine

## 2021-08-19 ENCOUNTER — Other Ambulatory Visit: Payer: Self-pay | Admitting: Family Medicine

## 2021-08-26 ENCOUNTER — Encounter: Payer: Self-pay | Admitting: Cardiovascular Disease

## 2021-09-01 ENCOUNTER — Telehealth: Payer: Self-pay | Admitting: Internal Medicine

## 2021-09-01 ENCOUNTER — Ambulatory Visit (INDEPENDENT_AMBULATORY_CARE_PROVIDER_SITE_OTHER): Payer: Medicare Other

## 2021-09-01 VITALS — Ht 74.0 in | Wt 221.0 lb

## 2021-09-01 DIAGNOSIS — Z0000 Encounter for general adult medical examination without abnormal findings: Secondary | ICD-10-CM | POA: Diagnosis not present

## 2021-09-01 MED ORDER — LUBIPROSTONE 8 MCG PO CAPS: 8.0000 ug | ORAL_CAPSULE | Freq: Two times a day (BID) | ORAL | 1 refills | Status: DC

## 2021-09-01 MED ORDER — LUBIPROSTONE 8 MCG PO CAPS: 8.0000 ug | ORAL_CAPSULE | Freq: Two times a day (BID) | ORAL | 0 refills | Status: DC

## 2021-09-01 NOTE — Patient Instructions (Signed)
[name] , Thank you for taking time to complete your Medicare Wellness Visit. I appreciate your ongoing commitment to your health goals. Please review the following plan we discussed and let me know if I can assist you in the future.   Screening recommendations/referrals: Colonoscopy: No longer required Recommended yearly ophthalmology/optometry visit for glaucoma screening and checkup Recommended yearly dental visit for hygiene and checkup  Vaccinations: Influenza vaccine: Up to date Pneumococcal vaccine: Up to date Tdap vaccine: Due-May obtain vaccine at our office or your local pharmacy. Shingles vaccine: Completed vaccines   Covid-19: Up to date  Advanced directives: Copy in chart  Conditions/risks identified: See problem list  Next appointment: Follow up in one year for your annual wellness visit.   Preventive Care 67 Years and Older, Male Preventive care refers to lifestyle choices and visits with your health care provider that can promote health and wellness. What does preventive care include? A yearly physical exam. This is also called an annual well check. Dental exams once or twice a year. Routine eye exams. Ask your health care provider how often you should have your eyes checked. Personal lifestyle choices, including: Daily care of your teeth and gums. Regular physical activity. Eating a healthy diet. Avoiding tobacco and drug use. Limiting alcohol use. Practicing safe sex. Taking low doses of aspirin every day. Taking vitamin and mineral supplements as recommended by your health care provider. What happens during an annual well check? The services and screenings done by your health care provider during your annual well check will depend on your age, overall health, lifestyle risk factors, and family history of disease. Counseling  Your health care provider may ask you questions about your: Alcohol use. Tobacco use. Drug use. Emotional well-being. Home and  relationship well-being. Sexual activity. Eating habits. History of falls. Memory and ability to understand (cognition). Work and work Statistician. Screening  You may have the following tests or measurements: Height, weight, and BMI. Blood pressure. Lipid and cholesterol levels. These may be checked every 5 years, or more frequently if you are over 24 years old. Skin check. Lung cancer screening. You may have this screening every year starting at age 58 if you have a 30-pack-year history of smoking and currently smoke or have quit within the past 15 years. Fecal occult blood test (FOBT) of the stool. You may have this test every year starting at age 52. Flexible sigmoidoscopy or colonoscopy. You may have a sigmoidoscopy every 5 years or a colonoscopy every 10 years starting at age 45. Prostate cancer screening. Recommendations will vary depending on your family history and other risks. Hepatitis C blood test. Hepatitis B blood test. Sexually transmitted disease (STD) testing. Diabetes screening. This is done by checking your blood sugar (glucose) after you have not eaten for a while (fasting). You may have this done every 1-3 years. Abdominal aortic aneurysm (AAA) screening. You may need this if you are a current or former smoker. Osteoporosis. You may be screened starting at age 43 if you are at high risk. Talk with your health care provider about your test results, treatment options, and if necessary, the need for more tests. Vaccines  Your health care provider may recommend certain vaccines, such as: Influenza vaccine. This is recommended every year. Tetanus, diphtheria, and acellular pertussis (Tdap, Td) vaccine. You may need a Td booster every 10 years. Zoster vaccine. You may need this after age 59. Pneumococcal 13-valent conjugate (PCV13) vaccine. One dose is recommended after age 74. Pneumococcal polysaccharide (  PPSV23) vaccine. One dose is recommended after age 49. Talk to your  health care provider about which screenings and vaccines you need and how often you need them. This information is not intended to replace advice given to you by your health care provider. Make sure you discuss any questions you have with your health care provider. Document Released: 08/02/2015 Document Revised: 03/25/2016 Document Reviewed: 05/07/2015 Elsevier Interactive Patient Education  2017 [employer] Prevention in the Home Falls can cause injuries. They can happen to people of all ages. There are many things you can do to make your home safe and to help prevent falls. What can I do on the outside of my home? Regularly fix the edges of walkways and driveways and fix any cracks. Remove anything that might make you trip as you walk through a door, such as a raised step or threshold. Trim any bushes or trees on the path to your home. Use bright outdoor lighting. Clear any walking paths of anything that might make someone trip, such as rocks or tools. Regularly check to see if handrails are loose or broken. Make sure that both sides of any steps have handrails. Any raised decks and porches should have guardrails on the edges. Have any leaves, snow, or ice cleared regularly. Use sand or salt on walking paths during winter. Clean up any spills in your garage right away. This includes oil or grease spills. What can I do in the bathroom? Use night lights. Install grab bars by the toilet and in the tub and shower. Do not use towel bars as grab bars. Use non-skid mats or decals in the tub or shower. If you need to sit down in the shower, use a plastic, non-slip stool. Keep the floor dry. Clean up any water that spills on the floor as soon as it happens. Remove soap buildup in the tub or shower regularly. Attach bath mats securely with double-sided non-slip rug tape. Do not have throw rugs and other things on the floor that can make you trip. What can I do in the bedroom? Use night  lights. Make sure that you have a light by your bed that is easy to reach. Do not use any sheets or blankets that are too big for your bed. They should not hang down onto the floor. Have a firm chair that has side arms. You can use this for support while you get dressed. Do not have throw rugs and other things on the floor that can make you trip. What can I do in the kitchen? Clean up any spills right away. Avoid walking on wet floors. Keep items that you use a lot in easy-to-reach places. If you need to reach something above you, use a strong step stool that has a grab bar. Keep electrical cords out of the way. Do not use floor polish or wax that makes floors slippery. If you must use wax, use non-skid floor wax. Do not have throw rugs and other things on the floor that can make you trip. What can I do with my stairs? Do not leave any items on the stairs. Make sure that there are handrails on both sides of the stairs and use them. Fix handrails that are broken or loose. Make sure that handrails are as long as the stairways. Check any carpeting to make sure that it is firmly attached to the stairs. Fix any carpet that is loose or worn. Avoid having throw rugs at the top or  bottom of the stairs. If you do have throw rugs, attach them to the floor with carpet tape. Make sure that you have a light switch at the top of the stairs and the bottom of the stairs. If you do not have them, ask someone to add them for you. What else can I do to help prevent falls? Wear shoes that: Do not have high heels. Have rubber bottoms. Are comfortable and fit you well. Are closed at the toe. Do not wear sandals. If you use a stepladder: Make sure that it is fully opened. Do not climb a closed stepladder. Make sure that both sides of the stepladder are locked into place. Ask someone to hold it for you, if possible. Clearly mark and make sure that you can see: Any grab bars or handrails. First and last  steps. Where the edge of each step is. Use tools that help you move around (mobility aids) if they are needed. These include: Canes. Walkers. Scooters. Crutches. Turn on the lights when you go into a dark area. Replace any light bulbs as soon as they burn out. Set up your furniture so you have a clear path. Avoid moving your furniture around. If any of your floors are uneven, fix them. If there are any pets around you, be aware of where they are. Review your medicines with your doctor. Some medicines can make you feel dizzy. This can increase your chance of falling. Ask your doctor what other things that you can do to help prevent falls. This information is not intended to replace advice given to you by your health care provider. Make sure you discuss any questions you have with your health care provider. Document Released: 05/02/2009 Document Revised: 12/12/2015 Document Reviewed: 08/10/2014 Elsevier Interactive Patient Education  2017 [employer].

## 2021-09-01 NOTE — Telephone Encounter (Signed)
Spoke patient - he asked if he could wait until April and see Dr. Hilarie Fredrickson and still get a refill of his Amitiza.  We cancelled his upcoming APP appointment and rescheduled him for Dr. Vena Rua next available office visit.  I refilled his Amitiza and we clarified how it should be taken.

## 2021-09-01 NOTE — Telephone Encounter (Signed)
Patient called states he is out of Amitiza medication scheduled an OV  for 09/12/21. Requested a call back to go over what he would need until then and what pharmacy to send it due to cost.

## 2021-09-01 NOTE — Progress Notes (Signed)
Subjective:   Tanner [name] is a 87 y.o. male who presents for Medicare Annual/Subsequent preventive examination.  I connected with Sanjay today by telephone and verified that I am speaking with the correct person using two identifiers. Location patient: home Location provider: work Persons participating in the virtual visit: patient, Marine scientist.    I discussed the limitations, risks, security and privacy concerns of performing an evaluation and management service by telephone and the availability of in person appointments. I also discussed with the patient that there may be a patient responsible charge related to this service. The patient expressed understanding and verbally consented to this telephonic visit.    Interactive audio and video telecommunications were attempted between this provider and patient, however failed, due to patient having technical difficulties OR patient did not have access to video capability.  We continued and completed visit with audio only.  Some vital signs may be absent or patient reported.   Time Spent with patient on telephone encounter: 20 minutes   Review of Systems     Cardiac Risk Factors include: advanced age (>11men, >49 women);hypertension;dyslipidemia;male gender     Objective:    Today's Vitals   09/01/21 1342  Weight: 221 lb (100.2 kg)  Height: 6\' 2"  (1.88 m)   Body mass index is 28.37 kg/m.  Advanced Directives 09/01/2021 06/10/2021 07/30/2020 06/07/2020 08/11/2019 05/29/2019 05/23/2019  Does Patient Have a Medical Advance Directive? Yes No No Yes No No No  Type of Paramedic of [place];Living will - - - - - -  Copy of [place] in Chart? Yes - validated most recent copy scanned in chart (See row information) - - - - - -  Would patient like information on creating a medical advance directive? - - No - Patient declined - No - Patient declined No - Patient declined No - Patient declined   Pre-existing out of facility DNR order (yellow form or pink MOST form) - - - - - - -    Current Medications (verified) Outpatient Encounter Medications as of 09/01/2021  Medication Sig   acetaminophen (TYLENOL) 500 MG tablet Take 1,000 mg by mouth as needed for moderate pain or headache.    aspirin EC 81 MG tablet Take 81 mg by mouth every evening.    chlorthalidone (HYGROTON) 25 MG tablet TAKE 1 TABLET BY MOUTH IN  THE MORNING   levothyroxine (SYNTHROID) 88 MCG tablet TAKE 1 TABLET BY MOUTH  DAILY BEFORE BREAKFAST   lubiprostone (AMITIZA) 8 MCG capsule Take 1 capsule (8 mcg total) by mouth in the morning and at bedtime.   metoprolol succinate (TOPROL-XL) 25 MG 24 hr tablet TAKE ONE-HALF TABLET BY  MOUTH DAILY   pantoprazole (PROTONIX) 40 MG tablet TAKE 1 TABLET BY MOUTH  DAILY   ramipril (ALTACE) 10 MG capsule TAKE 1 CAPSULE BY MOUTH  TWICE DAILY   rivaroxaban (XARELTO) 20 MG TABS tablet TAKE 1 TABLET BY MOUTH  DAILY WITH SUPPER   rosuvastatin (CRESTOR) 40 MG tablet TAKE 1 TABLET BY MOUTH IN  THE EVENING   gabapentin (NEURONTIN) 100 MG capsule TAKE 1 CAPSULE BY MOUTH 3 TIMES  DAILY   [DISCONTINUED] lubiprostone (AMITIZA) 8 MCG capsule Take 1 capsule (8 mcg total) by mouth daily with breakfast. Please schedule a yearly follow up for further refills. Thank you   No facility-administered encounter medications on file as of 09/01/2021.    Allergies (verified) Patient has no known allergies.   History: Past Medical History:  Diagnosis Date   Allergy    seasonal- mostly spring   Anemia 02/03/2011   ASTHMA, CHILDHOOD 03/17/2010   mostly as child   BCC (basal cell carcinoma) 12/08/2011   CAROTID ARTERY OCCLUSION, WITH INFARCTION 03/17/2010   CEREBROVASCULAR ACCIDENT 03/17/2010   partial loss of peripheral vision on left-"slight improved"   CHICKENPOX, HX OF 03/17/2010   Constipation 12/08/2014   Epistaxis 09/06/2013   FATIGUE 03/17/2010   Hearing loss 11/04/2010   bilateral   HYPERKALEMIA  05/27/2010   Hyperlipidemia    Hypertension    Hypothyroid 12/08/2011   Mixed hyperlipidemia 03/17/2010   Oral lesion 11/20/2014   Overweight(278.02) 07/01/2010   Peripheral neuropathy 08/18/2016   Personal history of other infectious and parasitic disease 03/17/2010   Preventative health care 08/23/2016   Skin lesion of right arm 11/20/2014   Sleep apnea    uses mouthpiece only   Thrombocytopenia ([place]) 04/05/2012   TOBACCO ABUSE, HX OF 03/17/2010   Tubular adenoma of colon 06/10/2011   Past Surgical History:  Procedure Laterality Date   CARDIOVERSION N/A 08/11/2019   Procedure: CARDIOVERSION;  Surgeon: Josue Hector, MD;  Location: [facility] ENDOSCOPY;  Service: Cardiovascular;  Laterality: N/A;   CAROTID ENDARTERECTOMY Left February 13, 2013   cea   CATARACT EXTRACTION, BILATERAL Right    Cataract, and stigmatism   COLONOSCOPY  Dec. 2014   ENDARTERECTOMY Left 02/13/2013   Procedure: ENDARTERECTOMY CAROTID;  Surgeon: Rosetta Posner, MD;  Location: [facility];  Service: Vascular;  Laterality: Left;   EUS N/A 08/17/2013   Procedure: UPPER ENDOSCOPIC ULTRASOUND (EUS) LINEAR;  Surgeon: Milus Banister, MD;  Location: [facility] ENDOSCOPY;  Service: Endoscopy;  Laterality: N/A;   knee cartliage repair  1964, 1990   bilateral   PILONIDAL CYST EXCISION  1968   removed   ROTATOR CUFF REPAIR     right   TONSILLECTOMY     UPPER GI ENDOSCOPY  Jan. 2015   Family History  Problem Relation Age of Onset   Alzheimer's disease Mother    Emphysema Sister        cigarettes   Cancer Sister        lung, tobacco    Heart disease Son    Coronary artery disease Brother    Heart attack Brother    Other Brother        heart problems- from fathers side   Cancer Brother 17       lung? smoker   Heart disease Brother 11       Heart disease before age 93   Cancer Sister        gyn   Heart disease Sister    Bipolar disorder Daughter    Obesity Daughter    Stroke Father    Heart disease Sister    Esophageal cancer Neg Hx     Colon cancer Neg Hx    Pancreatic cancer Neg Hx    Stomach cancer Neg Hx    Social History   Socioeconomic History   Marital status: Divorced    Spouse name: Not on file   Number of children: 3   Years of education: 83yrs   Highest education level: Bachelor's degree (e.g., BA, AB, BS)  Occupational History   Occupation: retired    Fish farm manager: RETIRED  Tobacco Use   Smoking status: Former    Packs/day: 2.00    Years: 20.00    Pack years: 40.00    Types: Cigarettes    Quit date: 07/19/1977  Years since quitting: 44.1   Smokeless tobacco: Never  Vaping Use   Vaping Use: Never used  Substance and Sexual Activity   Alcohol use: Yes    Alcohol/week: 5.0 standard drinks    Types: 2 Cans of beer, 2 Glasses of wine, 1 Shots of liquor per week    Comment: dinner wine daily   Drug use: No   Sexual activity: Never  Other Topics Concern   Not on file  Social History Narrative   Pt lives at home alone.    Caffeine Use: very little   Heart healthy diet, is planning exercise      Retired from Event organiser   Left handed   Social Determinants of Health   Financial Resource Strain: Low Risk    Difficulty of Paying Living Expenses: Not hard at all  Food Insecurity: No Food Insecurity   Worried About Charity fundraiser in the Last Year: Never true   Arboriculturist in the Last Year: Never true  Transportation Needs: No Transportation Needs   Lack of Transportation (Medical): No   Lack of Transportation (Non-Medical): No  Physical Activity: Sufficiently Active   Days of Exercise per Week: 4 days   Minutes of Exercise per Session: 40 min  Stress: No Stress Concern Present   Feeling of Stress : Not at all  Social Connections: Moderately Integrated   Frequency of Communication with Friends and Family: More than three times a week   Frequency of Social Gatherings with Friends and Family: More than three times a week   Attends Religious Services: More than 4 times per year    Active Member of [employer] or Organizations: Yes   Attends Music therapist: More than 4 times per year   Marital Status: Divorced    Tobacco Counseling Counseling given: Not Answered   Clinical Intake:  Pre-visit preparation completed: Yes  Pain : No/denies pain     BMI - recorded: 28.37 Nutritional Status: BMI 25 -29 Overweight Nutritional Risks: None Diabetes: No  How often do you need to have someone help you when you read instructions, pamphlets, or other written materials from your doctor or pharmacy?: 1 - Never  Diabetic?No  Interpreter Needed?: No  Information entered by :: Caroleen Hamman LPn   Activities of Daily Living In your present state of health, do you have any difficulty performing the following activities: 09/01/2021 04/17/2021  Hearing? N N  Vision? N N  Difficulty concentrating or making decisions? N N  Walking or climbing stairs? N N  Dressing or bathing? N N  Doing errands, shopping? N N  Preparing Food and eating ? N -  Using the Toilet? N -  In the past six months, have you accidently leaked urine? N -  Do you have problems with loss of bowel control? N -  Managing your Medications? N -  Managing your Finances? N -  Housekeeping or managing your Housekeeping? N -  Some recent data might be hidden    Patient Care Team: [name], MD as PCP - Almond Lint, DO as Consulting Physician (Neurology) Early, Arvilla Meres, MD as Consulting Physician (Vascular Surgery) Druscilla Brownie, MD as Consulting Physician (Dermatology) Pyrtle, Lajuan Lines, MD as Consulting Physician (Gastroenterology)  Indicate any recent Medical Services you may have received from other than [facility] providers in the past year (date may be approximate).     Assessment:   This is a routine wellness examination for [place].  Hearing/Vision screen Hearing Screening - Comments:: Biltaeral hearing aids Vision Screening - Comments:: Last eye exam-04/2021-Dr.  Tanner  Dietary issues and exercise activities discussed: Current Exercise Habits: Home exercise routine, Type of exercise: walking, Time (Minutes): 40, Frequency (Times/Week): 4, Weekly Exercise (Minutes/Week): 160, Intensity: Mild, Exercise limited by: None identified   Goals Addressed             This Visit's Progress    Patient Stated       Would like to lose some weight       Depression Screen PHQ 2/9 Scores 09/01/2021 04/17/2021 04/11/2020 03/31/2019 10/05/2017 08/18/2016 06/04/2015  PHQ - 2 Score 0 0 0 0 0 0 0  PHQ- 9 Score - 3 0 0 - - -    Fall Risk Fall Risk  09/01/2021 06/10/2021 06/07/2020 04/11/2020 05/29/2019  Falls in the past year? 0 0 0 - 0  Number falls in past yr: 0 0 0 0 0  Injury with Fall? 0 0 0 1 0  Follow up Falls prevention discussed - - - -    FALL RISK PREVENTION PERTAINING TO THE HOME:  Any stairs in or around the home? Yes  If so, are there any without handrails? No  Home free of loose throw rugs in walkways, pet beds, electrical cords, etc? No rugs-pt aware of falls risk Adequate lighting in your home to reduce risk of falls? Yes   ASSISTIVE DEVICES UTILIZED TO PREVENT FALLS:  Life alert? No  Use of a cane, walker or w/c? No  Grab bars in the bathroom? Yes  Shower chair or bench in shower? No  Elevated toilet seat or a handicapped toilet? No elevated toilet  TIMED UP AND GO:  Was the test performed? No . Phone visit   Cognitive Function:Normal cognitive status assessed by this Nurse Health Advisor. No abnormalities found.          Immunizations Immunization History  Administered Date(s) Administered   Fluad Quad(high Dose 65+) 04/25/2019, 04/11/2020, 04/17/2021   Influenza Split 04/05/2012   Influenza Whole 04/19/2009, 05/06/2011   Influenza, High Dose Seasonal PF 05/09/2013, 04/13/2016, 04/16/2017, 04/05/2018   Influenza-Unspecified 03/29/2014, 05/07/2015   Moderna Covid-19 Vaccine Bivalent Booster 70yrs & up 04/16/2021   Moderna  SARS-COV2 Booster Vaccination 05/13/2020, 11/04/2020   Moderna Sars-Covid-2 Vaccination 03/13/2019, 04/10/2019   Pneumococcal Conjugate-13 05/17/2014   Pneumococcal Polysaccharide-23 07/21/2007, 04/05/2018   Td 07/21/2003   Tdap 06/10/2011   Zoster Recombinat (Shingrix) 04/27/2017, 07/04/2017   Zoster, Live 07/21/2007    TDAP status: Due, Education has been provided regarding the importance of this vaccine. Advised may receive this vaccine at local pharmacy or Health Dept. Aware to provide a copy of the vaccination record if obtained from local pharmacy or Health Dept. Verbalized acceptance and understanding.  Flu Vaccine status: Up to date  Pneumococcal vaccine status: Up to date  Covid-19 vaccine status: Completed vaccines  Qualifies for Shingles Vaccine? No   Zostavax completed Yes   Shingrix Completed?: Yes  Screening Tests Health Maintenance  Topic Date Due   FOOT EXAM  Never done   OPHTHALMOLOGY EXAM  Never done   COVID-19 Vaccine (3 - Moderna risk series) 04/16/2021   TETANUS/TDAP  06/09/2021   HEMOGLOBIN A1C  10/13/2021   COLONOSCOPY (Pts 45-86yrs Insurance coverage will need to be confirmed)  11/02/2022   Pneumonia Vaccine 67+ Years old  Completed   INFLUENZA VACCINE  Completed   Zoster Vaccines- Shingrix  Completed   HPV VACCINES  Aged Out  Health Maintenance  Health Maintenance Due  Topic Date Due   FOOT EXAM  Never done   OPHTHALMOLOGY EXAM  Never done   COVID-19 Vaccine (3 - Moderna risk series) 04/16/2021   TETANUS/TDAP  06/09/2021    Colorectal cancer screening: No longer required.   Lung Cancer Screening: (Low Dose CT Chest recommended if Age 74-80 years, 30 pack-year currently smoking OR have quit w/in 15years.) does not qualify.     Additional Screening:  Hepatitis C Screening: does not qualify  Vision Screening: Recommended annual ophthalmology exams for early detection of glaucoma and other disorders of the eye. Is the patient up to date  with their annual eye exam?  Yes  Who is the provider or what is the name of the office in which the patient attends annual eye exams? Dr. Satira Sark  Dental Screening: Recommended annual dental exams for proper oral hygiene  Community Resource Referral / Chronic Care Management: CRR required this visit?  No   CCM required this visit?  No      Plan:     I have personally reviewed and noted the following in the patients chart:   Medical and social history Use of alcohol, tobacco or illicit drugs  Current medications and supplements including opioid prescriptions. Patient is not currently taking opioid prescriptions. Functional ability and status Nutritional status Physical activity Advanced directives List of other physicians Hospitalizations, surgeries, and ER visits in previous 12 months Vitals Screenings to include cognitive, depression, and falls Referrals and appointments  In addition, I have reviewed and discussed with patient certain preventive protocols, quality metrics, and best practice recommendations. A written personalized care plan for preventive services as well as general preventive health recommendations were provided to patient.   Due to this being a telephonic visit, the after visit summary with patients personalized plan was offered to patient via mail or my-chart.  Patient would like to access on my-chart.   Marta Antu, LPN   02/26/1750  Nurse Health Advisor  Nurse Notes: None

## 2021-09-02 ENCOUNTER — Ambulatory Visit: Payer: Medicare Other | Admitting: Family Medicine

## 2021-09-12 ENCOUNTER — Ambulatory Visit: Payer: Medicare Other | Admitting: Gastroenterology

## 2021-09-15 NOTE — Telephone Encounter (Signed)
Inbound call from patient would like a call back. States he would like to discuss Amitiza. Would not go into detail would just like a call back

## 2021-09-25 MED ORDER — LUBIPROSTONE 8 MCG PO CAPS: 8.0000 ug | ORAL_CAPSULE | Freq: Two times a day (BID) | ORAL | 1 refills | Status: DC

## 2021-09-25 NOTE — Telephone Encounter (Signed)
Sent a new prescription for Amitiza per [facility]'s request ?

## 2021-09-25 NOTE — Addendum Note (Signed)
Addended by: Audrea Muscat on: 09/25/2021 03:22 PM ? ? Modules accepted: Orders ? ?

## 2021-10-26 ENCOUNTER — Emergency Department (HOSPITAL_BASED_OUTPATIENT_CLINIC_OR_DEPARTMENT_OTHER)
Admission: EM | Admit: 2021-10-26 | Discharge: 2021-10-26 | Disposition: A | Discharge status: Home / Self Care | Payer: Medicare Other | Attending: Emergency Medicine | Admitting: Emergency Medicine

## 2021-10-26 ENCOUNTER — Encounter (HOSPITAL_BASED_OUTPATIENT_CLINIC_OR_DEPARTMENT_OTHER): Payer: Self-pay | Admitting: Emergency Medicine

## 2021-10-26 ENCOUNTER — Emergency Department (HOSPITAL_BASED_OUTPATIENT_CLINIC_OR_DEPARTMENT_OTHER): Payer: Medicare Other | Admitting: Radiology

## 2021-10-26 DIAGNOSIS — I480 Paroxysmal atrial fibrillation: Secondary | ICD-10-CM | POA: Insufficient documentation

## 2021-10-26 DIAGNOSIS — Z7901 Long term (current) use of anticoagulants: Secondary | ICD-10-CM | POA: Diagnosis not present

## 2021-10-26 DIAGNOSIS — Z7982 Long term (current) use of aspirin: Secondary | ICD-10-CM | POA: Diagnosis not present

## 2021-10-26 DIAGNOSIS — R531 Weakness: Secondary | ICD-10-CM | POA: Diagnosis present

## 2021-10-26 LAB — URINALYSIS, ROUTINE W REFLEX MICROSCOPIC
Bilirubin Urine: NEGATIVE
Glucose, UA: NEGATIVE mg/dL
Hgb urine dipstick: NEGATIVE
Ketones, ur: NEGATIVE mg/dL
Leukocytes,Ua: NEGATIVE
Nitrite: NEGATIVE
Protein, ur: 30 mg/dL — AB
Specific Gravity, Urine: 1.018 (ref 1.005–1.030)
pH: 6.5 (ref 5.0–8.0)

## 2021-10-26 LAB — BASIC METABOLIC PANEL
Anion gap: 11 (ref 5–15)
BUN: 23 mg/dL (ref 8–23)
CO2: 28 mmol/L (ref 22–32)
Calcium: 9.9 mg/dL (ref 8.9–10.3)
Chloride: 101 mmol/L (ref 98–111)
Creatinine, Ser: 1.33 mg/dL — ABNORMAL HIGH (ref 0.61–1.24)
GFR, Estimated: 53 mL/min — ABNORMAL LOW (ref >60)
Glucose, Bld: 113 mg/dL — ABNORMAL HIGH (ref 70–99)
Potassium: 3.6 mmol/L (ref 3.5–5.1)
Sodium: 140 mmol/L (ref 135–145)

## 2021-10-26 LAB — MAGNESIUM: Magnesium: 2.1 mg/dL (ref 1.7–2.4)

## 2021-10-26 LAB — CBC
HCT: 41.9 % (ref 39.0–52.0)
Hemoglobin: 13.9 g/dL (ref 13.0–17.0)
MCH: 31.1 pg (ref 26.0–34.0)
MCHC: 33.2 g/dL (ref 30.0–36.0)
MCV: 93.7 fL (ref 80.0–100.0)
Platelets: 189 10*3/uL (ref 150–400)
RBC: 4.47 MIL/uL (ref 4.22–5.81)
RDW: 13 % (ref 11.5–15.5)
WBC: 8 10*3/uL (ref 4.0–10.5)
nRBC: 0 % (ref 0.0–0.2)

## 2021-10-26 LAB — TROPONIN I (HIGH SENSITIVITY): Troponin I (High Sensitivity): 5 ng/L (ref <18)

## 2021-10-26 LAB — CBG MONITORING, ED: Glucose-Capillary: 103 mg/dL — ABNORMAL HIGH (ref 70–99)

## 2021-10-26 MED ORDER — METOPROLOL TARTRATE 5 MG/5ML IV SOLN
5.0000 mg | Freq: Once | INTRAVENOUS | Status: AC
  Administered 2021-10-26: 5 mg via INTRAVENOUS
  Filled 2021-10-26: qty 5

## 2021-10-26 NOTE — ED Notes (Signed)
Patient transported to X-ray 

## 2021-10-26 NOTE — ED Triage Notes (Signed)
Became dizzy and weak in shower. Sat down to rest, put on his watch and said his heart rate was irregular.  ?

## 2021-10-26 NOTE — ED Provider Notes (Signed)
?[facility] EMERGENCY DEPT ?Provider Note ? ? ?CSN: [id number] ?Arrival date & time: 10/26/21  0759 ? ?  ? ?History ? ?Chief Complaint  ?Patient presents with  ? Weakness  ? ? ?Tanner [name] is a 87 y.o. male. ? ? ?Weakness ?Associated symptoms: no fever   ? ?Patient has a history of hyperlipidemia, atrial fibrillation, hyperkalemia, fatigue, carotid artery occlusion, thrombocytopenia, epistaxis, daily alcohol use who presents with with weakness.   ? ?Patient states when he got up this morning he felt a little bit off.  He felt slightly lightheaded when he was showering and bending over.  He denied having any trouble with any chest pain.  Felt like his breathing was a little bit different but was not particularly short of breath.  Patient denied any trouble with any vomiting or diarrhea.  He has had some issues with constipation.  He denies any dysuria.  No trouble with fevers chills or cough. ? ?Patient states he checked his Apple Watch and it did suggest he was in atrial fibrillation so he came to the ED.  Patient does have a history of A-fib.  He was initially diagnosed by his primary care doctor he ended up getting referred to his cardiologist and had a scheduled cardioversion performed.  Patient does continue to take anticoagulants ? ?Home Medications ?Prior to Admission medications   ?Medication Sig Start Date End Date Taking? Authorizing Provider  ?aspirin EC 81 MG tablet Take 81 mg by mouth every evening.    Yes [provider]  ?chlorthalidone (HYGROTON) 25 MG tablet TAKE 1 TABLET BY MOUTH IN  THE MORNING 05/28/21  Yes [name], MD  ?levothyroxine (SYNTHROID) 88 MCG tablet TAKE 1 TABLET BY MOUTH  DAILY BEFORE BREAKFAST 12/10/20  Yes [name], MD  ?lubiprostone (AMITIZA) 8 MCG capsule Take 1 capsule (8 mcg total) by mouth in the morning and at bedtime. 09/25/21  Yes Pyrtle, Lajuan Lines, MD  ?metoprolol succinate (TOPROL-XL) 25 MG 24 hr tablet TAKE ONE-HALF TABLET BY  MOUTH  DAILY ?Patient taking differently: 2 (two) times daily. 08/19/21  Yes [name], MD  ?pantoprazole (PROTONIX) 40 MG tablet TAKE 1 TABLET BY MOUTH  DAILY 03/10/21  Yes Shelda Pal, DO  ?ramipril (ALTACE) 10 MG capsule TAKE 1 CAPSULE BY MOUTH  TWICE DAILY 12/10/20  Yes [name], MD  ?rivaroxaban (XARELTO) 20 MG TABS tablet TAKE 1 TABLET BY MOUTH  DAILY WITH SUPPER 05/28/21  Yes Josue Hector, MD  ?rosuvastatin (CRESTOR) 40 MG tablet TAKE 1 TABLET BY MOUTH IN  THE EVENING 03/10/21  Yes Shelda Pal, DO  ?acetaminophen (TYLENOL) 500 MG tablet Take 1,000 mg by mouth as needed for moderate pain or headache.     [provider]  ?gabapentin (NEURONTIN) 100 MG capsule TAKE 1 CAPSULE BY MOUTH 3 TIMES  DAILY 06/30/21   Pieter Partridge, DO  ?   ? ?Allergies    ?Patient has no known allergies.   ? ?Review of Systems   ?Review of Systems  ?Constitutional:  Negative for fever.  ?Neurological:  Positive for weakness.  ? ?Physical Exam ?Updated Vital Signs ?BP 104/72   Pulse 64   Temp 97.7 ?F (36.5 ?C)   Resp 15   SpO2 99%  ?Physical Exam ?Vitals and nursing note reviewed.  ?Constitutional:   ?   General: He is not in acute distress. ?   Appearance: He is well-developed.  ?HENT:  ?   Head: Normocephalic  and atraumatic.  ?   Right Ear: External ear normal.  ?   Left Ear: External ear normal.  ?Eyes:  ?   General: No scleral icterus.    ?   Right eye: No discharge.     ?   Left eye: No discharge.  ?   Conjunctiva/sclera: Conjunctivae normal.  ?Neck:  ?   Trachea: No tracheal deviation.  ?Cardiovascular:  ?   Rate and Rhythm: Normal rate and regular rhythm.  ?Pulmonary:  ?   Effort: Pulmonary effort is normal. No respiratory distress.  ?   Breath sounds: No stridor. Rales present. No wheezing.  ?Abdominal:  ?   General: Bowel sounds are normal. There is no distension.  ?   Palpations: Abdomen is soft.  ?   Tenderness: There is no abdominal tenderness. There is no guarding or rebound.   ?Musculoskeletal:     ?   General: No tenderness or deformity.  ?   Cervical back: Neck supple.  ?Skin: ?   General: Skin is warm and dry.  ?   Findings: No rash.  ?Neurological:  ?   General: No focal deficit present.  ?   Mental Status: He is alert.  ?   Cranial Nerves: No cranial nerve deficit (no facial droop, extraocular movements intact, no slurred speech).  ?   Sensory: No sensory deficit.  ?   Motor: No abnormal muscle tone or seizure activity.  ?   Coordination: Coordination normal.  ?Psychiatric:     ?   Mood and Affect: Mood normal.  ? ? ?ED Results / Procedures / Treatments   ?Labs ?(all labs ordered are listed, but only abnormal results are displayed) ?Labs Reviewed  ?BASIC METABOLIC PANEL - Abnormal; Notable for the following components:  ?    Result Value  ? Glucose, Bld 113 (*)   ? Creatinine, Ser 1.33 (*)   ? GFR, Estimated 53 (*)   ? All other components within normal limits  ?URINALYSIS, ROUTINE W REFLEX MICROSCOPIC - Abnormal; Notable for the following components:  ? Protein, ur 30 (*)   ? All other components within normal limits  ?CBG MONITORING, ED - Abnormal; Notable for the following components:  ? Glucose-Capillary 103 (*)   ? All other components within normal limits  ?CBC  ?MAGNESIUM  ?TROPONIN I (HIGH SENSITIVITY)  ? ? ?EKG ?EKG Interpretation ? ?Date/Time:  Sunday October 26 2021 08:07:56 EDT ?Ventricular Rate:  102 ?PR Interval:    ?QRS Duration: 94 ?QT Interval:  334 ?QTC Calculation: 435 ?R Axis:   31 ?Text Interpretation: Atrial fibrillation a fib new since last tracing Confirmed by Dorie Rank [phone]) on 10/26/2021 8:11:36 AM ? ?Radiology ?DG Chest 2 View ? ?Result Date: 10/26/2021 ?CLINICAL DATA:  Atrial fibrillation, lightheaded EXAM: CHEST - 2 VIEW COMPARISON:  Prior chest x-ray 02/07/2013 FINDINGS: The lungs are clear and negative for focal airspace consolidation, pulmonary edema or suspicious pulmonary nodule. No pleural effusion or pneumothorax. Mild bronchitic changes.  Atherosclerotic calcifications in the transverse aorta. Cardiac and mediastinal contours are within normal limits. No acute fracture or lytic or blastic osseous lesions. The visualized upper abdominal bowel gas pattern is unremarkable. IMPRESSION: No active cardiopulmonary disease. Electronically Signed   By: Jacqulynn Cadet M.D.   On: 10/26/2021 09:14   ? ?Procedures ?Procedures  ? ? ?Medications Ordered in ED ?Medications  ?metoprolol tartrate (LOPRESSOR) injection 5 mg (5 mg Intravenous Given 10/26/21 0938)  ? ? ?ED Course/ Medical Decision Making/ A&P ?Clinical  Course as of 10/26/21 1024  ?Nancy Fetter Oct 26, 2021  ?6578 Troponin I (High Sensitivity) [JK]  ?4696 Normal [JK]  ?2952 Basic metabolic panel(!) ?Creatinine slightly increased compared to previous [JK]  ?0923 Urinalysis, Routine w reflex microscopic Urine, Clean Catch(!) ?Normal [JK]  ?8413 CBC ?Normal [JK]  ?  ?Clinical Course User Index ?[JK] Dorie Rank, MD  ? ?    CHA2DS2-VASc Score: 2 ?                   ?Medical Decision Making ?Amount and/or Complexity of Data Reviewed ?Labs: ordered. Decision-making details documented in ED Course. ?Radiology: ordered. ? ?Risk ?Prescription drug management. ? ? ?Patient presented to the ED for evaluation of palpitations atrial fibrillation.  Patient has known history of A-fib and is on anticoagulation.  Patient states usually he is in sinus rhythm.  He did require cardioversion once in the past.  ED work-up is reassuring.  Patient is rate controlled.  No tachycardia.  No signs of electrolyte abnormalities or cardiac ischemia.  Patient was given a dose of Toprol IV.  Patient was able to walk around the ED without difficulty.  Patient does not require admission to the hospital for rate control.  Discussed cardioversion procedure with him.  Patient is not in any distress or particularly symptomatic at this time.  It is possible that he may return back into sinus rhythm spontaneously.  He may benefit from cardioversion if  this does not resolve within the next week but I do not feel there is any need for emergent cardioversion at this freestanding ED at this time.  We will have the patient follow-up closely with his cardiologist.  Will have him incr

## 2021-10-26 NOTE — Discharge Instructions (Signed)
Your heart rhythm does show that you are back in atrial fibrillation.  Your heart rate was well controlled in the emergency room.  Continue your Toprol medication.  You can increase it to 1 whole tablet, 25 mg daily while you are in atrial fibrillation.  Return to the [facility] ER for weakness, lightheadedness, shortness of breath. ?

## 2021-10-27 ENCOUNTER — Telehealth: Payer: Self-pay | Admitting: Cardiovascular Disease

## 2021-10-27 NOTE — Telephone Encounter (Signed)
Pt stated he went to hospital yesterday 4/09. ? ?STAT if HR is under 50 or over 120 ?(normal HR is 60-100 beats per minute) ? ?What is your heart rate? 112 ? ?Do you have a log of your heart rate readings (document readings)? no ? ?Do you have any other symptoms? Pt states that he is feeling weak ? ?

## 2021-10-27 NOTE — Telephone Encounter (Signed)
Spoke with the patient who reports that yesterday morning he woke up and felt "off". He states that he was tired and dizzy when he betn over. He checked his apple watch and it showed that he was in Afib with an elevated heart rate. He went to the ER and they advised him to increase his metoprolol to one tablet (25 mg) daily. He states that he took a full tablet this morning. Earlier his heart rate was around 112. He states that he is still feeling somewhat weak and lightheaded. Denies any shortness of breath.  ?He is scheduled to see Adline Peals, PA-C in the Afib clinic on 5/2. Will send message to see if they can work him in any sooner.  ?

## 2021-10-27 NOTE — Telephone Encounter (Signed)
Appt moved up

## 2021-11-02 ENCOUNTER — Other Ambulatory Visit: Payer: Self-pay | Admitting: Family Medicine

## 2021-11-03 ENCOUNTER — Ambulatory Visit (HOSPITAL_COMMUNITY)
Admission: RE | Admit: 2021-11-03 | Discharge: 2021-11-03 | Disposition: A | Discharge status: Home / Self Care | Payer: Medicare Other | Source: Ambulatory Visit | Attending: Physician Assistant | Admitting: Physician Assistant

## 2021-11-03 VITALS — BP 138/68 | HR 75 | Ht 74.0 in | Wt 215.2 lb

## 2021-11-03 DIAGNOSIS — I10 Essential (primary) hypertension: Secondary | ICD-10-CM | POA: Insufficient documentation

## 2021-11-03 DIAGNOSIS — Z7901 Long term (current) use of anticoagulants: Secondary | ICD-10-CM | POA: Diagnosis not present

## 2021-11-03 DIAGNOSIS — E785 Hyperlipidemia, unspecified: Secondary | ICD-10-CM | POA: Insufficient documentation

## 2021-11-03 DIAGNOSIS — Z79899 Other long term (current) drug therapy: Secondary | ICD-10-CM | POA: Insufficient documentation

## 2021-11-03 DIAGNOSIS — Z87891 Personal history of nicotine dependence: Secondary | ICD-10-CM | POA: Insufficient documentation

## 2021-11-03 DIAGNOSIS — I4819 Other persistent atrial fibrillation: Secondary | ICD-10-CM | POA: Insufficient documentation

## 2021-11-03 DIAGNOSIS — E039 Hypothyroidism, unspecified: Secondary | ICD-10-CM | POA: Diagnosis not present

## 2021-11-03 DIAGNOSIS — G4733 Obstructive sleep apnea (adult) (pediatric): Secondary | ICD-10-CM | POA: Diagnosis not present

## 2021-11-03 DIAGNOSIS — D6869 Other thrombophilia: Secondary | ICD-10-CM | POA: Diagnosis not present

## 2021-11-03 DIAGNOSIS — Z8673 Personal history of transient ischemic attack (TIA), and cerebral infarction without residual deficits: Secondary | ICD-10-CM | POA: Diagnosis not present

## 2021-11-03 MED ORDER — GABAPENTIN 100 MG PO CAPS: ORAL_CAPSULE | ORAL | Status: DC

## 2021-11-03 MED ORDER — METOPROLOL SUCCINATE ER 25 MG PO TB24: 25.0000 mg | ORAL_TABLET | Freq: Every day | ORAL | Status: DC

## 2021-11-03 MED ORDER — METOPROLOL SUCCINATE ER 25 MG PO TB24: 25.0000 mg | ORAL_TABLET | Freq: Every day | ORAL | 1 refills | Status: DC

## 2021-11-03 NOTE — Progress Notes (Signed)
? ? ?Primary Care Physician: [name], MD ?Primary Cardiologist: Dr [name] Cancel ?Primary Electrophysiologist: none ?Referring Physician: Dr [name] Cancel ? ? ?Tanner [name] is a 87 y.o. male with a history of HTN, tobacco abuse quit 2011, CVA 2011, HLD, hypothyroidism, OSA, and persistent atrial fibrillation who presents for follow up in the [facility]. The patient was initially diagnosed with atrial fibrillation 05/2019 at his PCP office after noting irregular readings on his FitBit. He is otherwise asymptomatic and rate controlled. Patient is on Xarelto for a CHADS2VASC score of 6. Patient is s/p DCCV on 08/11/19.  ? ?On follow up today, patient presented to the ED  10/26/21 feeling lightheaded and weak. His smart watch showed afib. ECG at the ED showed reasonably rate controlled afib and DCCV was not pursued at that time. He is rate controlled today and minimally symptomatic. There were no specific triggers that he could identify.  ? ?Today, he denies symptoms of palpitations, chest pain, shortness of breath, orthopnea, PND, lower extremity edema, presyncope, syncope, bleeding, or neurologic sequela. The patient is tolerating medications without difficulties and is otherwise without complaint today.  ? ? ?Atrial Fibrillation Risk Factors: ? ?he does have symptoms or diagnosis of sleep apnea. ?he is compliant with oral device therapy. ?he does not have a history of rheumatic fever. ?he does have a history of alcohol use. ?The patient does not have a history of early familial atrial fibrillation or other arrhythmias. ? ?he has a BMI of Body mass index is 27.63 kg/m?Marland KitchenMarland Kitchen ?Filed Weights  ? 11/03/21 0852  ?Weight: 97.6 kg  ? ? ? ?Family History  ?Problem Relation Age of Onset  ? Alzheimer's disease Mother   ? Emphysema Sister   ?     cigarettes  ? Cancer Sister   ?     lung, tobacco   ? Heart disease Son   ? Coronary artery disease Brother   ? Heart attack Brother   ? Other Brother   ?     heart  problems- from fathers side  ? Cancer Brother 60  ?     lung? smoker  ? Heart disease Brother 48  ?     Heart disease before age 33  ? Cancer Sister   ?     gyn  ? Heart disease Sister   ? Bipolar disorder Daughter   ? Obesity Daughter   ? Stroke Father   ? Heart disease Sister   ? Esophageal cancer Neg Hx   ? Colon cancer Neg Hx   ? Pancreatic cancer Neg Hx   ? Stomach cancer Neg Hx   ? ? ? ?Atrial Fibrillation Management history: ? ?Previous antiarrhythmic drugs: none ?Previous cardioversions: 08/11/19 ?Previous ablations: none ?CHADS2VASC score: 6 ?Anticoagulation history: Xarelto ? ? ?Past Medical History:  ?Diagnosis Date  ? Allergy   ? seasonal- mostly spring  ? Anemia 02/03/2011  ? ASTHMA, CHILDHOOD 03/17/2010  ? mostly as child  ? BCC (basal cell carcinoma) 12/08/2011  ? CAROTID ARTERY OCCLUSION, WITH INFARCTION 03/17/2010  ? CEREBROVASCULAR ACCIDENT 03/17/2010  ? partial loss of peripheral vision on left-"slight improved"  ? CHICKENPOX, HX OF 03/17/2010  ? Constipation 12/08/2014  ? Epistaxis 09/06/2013  ? FATIGUE 03/17/2010  ? Hearing loss 11/04/2010  ? bilateral  ? HYPERKALEMIA 05/27/2010  ? Hyperlipidemia   ? Hypertension   ? Hypothyroid 12/08/2011  ? Mixed hyperlipidemia 03/17/2010  ? Oral lesion 11/20/2014  ? Overweight(278.02) 07/01/2010  ? Peripheral neuropathy 08/18/2016  ?  Personal history of other infectious and parasitic disease 03/17/2010  ? Preventative health care 08/23/2016  ? Skin lesion of right arm 11/20/2014  ? Sleep apnea   ? uses mouthpiece only  ? Thrombocytopenia ([place]) 04/05/2012  ? TOBACCO ABUSE, HX OF 03/17/2010  ? Tubular adenoma of colon 06/10/2011  ? ?Past Surgical History:  ?Procedure Laterality Date  ? CARDIOVERSION N/A 08/11/2019  ? Procedure: CARDIOVERSION;  Surgeon: Josue Hector, MD;  Location: [facility];  Service: Cardiovascular;  Laterality: N/A;  ? CAROTID ENDARTERECTOMY Left February 13, 2013  ? cea  ? CATARACT EXTRACTION, BILATERAL Right   ? Cataract, and stigmatism  ? COLONOSCOPY  Dec. 2014   ? ENDARTERECTOMY Left 02/13/2013  ? Procedure: ENDARTERECTOMY CAROTID;  Surgeon: Rosetta Posner, MD;  Location: [facility];  Service: Vascular;  Laterality: Left;  ? EUS N/A 08/17/2013  ? Procedure: UPPER ENDOSCOPIC ULTRASOUND (EUS) LINEAR;  Surgeon: Milus Banister, MD;  Location: [facility];  Service: Endoscopy;  Laterality: N/A;  ? knee cartliage repair  1964, 1990  ? bilateral  ? [place]  ? removed  ? ROTATOR CUFF REPAIR    ? right  ? TONSILLECTOMY    ? UPPER GI ENDOSCOPY  Jan. 2015  ? ? ?Current Outpatient Medications  ?Medication Sig Dispense Refill  ? acetaminophen (TYLENOL) 500 MG tablet Take 1,000 mg by mouth as needed for moderate pain or headache.     ? aspirin EC 81 MG tablet Take 81 mg by mouth every evening.     ? chlorthalidone (HYGROTON) 25 MG tablet TAKE 1 TABLET BY MOUTH IN  THE MORNING 90 tablet 3  ? lubiprostone (AMITIZA) 8 MCG capsule Take 1 capsule (8 mcg total) by mouth in the morning and at bedtime. 180 capsule 1  ? pantoprazole (PROTONIX) 40 MG tablet TAKE 1 TABLET BY MOUTH  DAILY 90 tablet 3  ? rivaroxaban (XARELTO) 20 MG TABS tablet TAKE 1 TABLET BY MOUTH  DAILY WITH SUPPER 90 tablet 2  ? rosuvastatin (CRESTOR) 40 MG tablet TAKE 1 TABLET BY MOUTH IN  THE EVENING 90 tablet 3  ? gabapentin (NEURONTIN) 100 MG capsule Takes 1 tablet by mouth as needed    ? levothyroxine (SYNTHROID) 88 MCG tablet TAKE 1 TABLET BY MOUTH  DAILY BEFORE BREAKFAST 90 tablet 3  ? metoprolol succinate (TOPROL-XL) 25 MG 24 hr tablet Take 1 tablet (25 mg total) by mouth daily. 90 tablet 1  ? ramipril (ALTACE) 10 MG capsule TAKE 1 CAPSULE BY MOUTH  TWICE DAILY 180 capsule 3  ? ?No current facility-administered medications for this encounter.  ? ? ?No Known Allergies ? ?Social History  ? ?Socioeconomic History  ? Marital status: Divorced  ?  Spouse name: Not on file  ? Number of children: 3  ? Years of education: 60yr  ? Highest education level: Bachelor's degree (e.g., BA, AB, BS)  ?Occupational History   ? Occupation: retired  ?  Employer: RETIRED  ?Tobacco Use  ? Smoking status: Former  ?  Packs/day: 2.00  ?  Years: 20.00  ?  Pack years: 40.00  ?  Types: Cigarettes  ?  Quit date: 07/19/1977  ?  Years since quitting: 44.3  ? Smokeless tobacco: Never  ?Vaping Use  ? Vaping Use: Never used  ?Substance and Sexual Activity  ? Alcohol use: Yes  ?  Alcohol/week: 5.0 standard drinks  ?  Types: 2 Cans of beer, 2 Glasses of wine, 1 Shots of liquor per  week  ?  Comment: dinner wine daily  ? Drug use: No  ? Sexual activity: Never  ?Other Topics Concern  ? Not on file  ?Social History Narrative  ? Pt lives at home alone.   ? Caffeine Use: very little  ? Heart healthy diet, is planning exercise  ?   ? Retired from [employer]  ? Left handed  ? ?Social Determinants of Health  ? ?Financial Resource Strain: Low Risk   ? Difficulty of Paying Living Expenses: Not hard at all  ?Food Insecurity: No Food Insecurity  ? Worried About Charity fundraiser in the Last Year: Never true  ? Ran Out of Food in the Last Year: Never true  ?Transportation Needs: No Transportation Needs  ? Lack of Transportation (Medical): No  ? Lack of Transportation (Non-Medical): No  ?Physical Activity: Sufficiently Active  ? Days of Exercise per Week: 4 days  ? Minutes of Exercise per Session: 40 min  ?Stress: No Stress Concern Present  ? Feeling of Stress : Not at all  ?Social Connections: Moderately Integrated  ? Frequency of Communication with Friends and Family: More than three times a week  ? Frequency of Social Gatherings with Friends and Family: More than three times a week  ? Attends Religious Services: More than 4 times per year  ? Active Member of Clubs or Organizations: Yes  ? Attends Archivist Meetings: More than 4 times per year  ? Marital Status: Divorced  ?Intimate Partner Violence: Not At Risk  ? Fear of Current or Ex-Partner: No  ? Emotionally Abused: No  ? Physically Abused: No  ? Sexually Abused: No  ? ? ? ?ROS- All systems are  reviewed and negative except as per the HPI above. ? ?Physical Exam: ?Vitals:  ? 11/03/21 0852  ?BP: 138/68  ?Pulse: 75  ?Weight: 97.6 kg  ?Height: '6\' 2"'$  (1.88 m)  ? ? ?GEN- The patient is a well ap

## 2021-11-03 NOTE — Patient Instructions (Signed)
Day of cardioversion reduce metoprolol back to 1/2 tablet a day ? ?Cardioversion scheduled for Friday, May 19th ? - come to office at 10am ? - Do not eat or drink anything after midnight the night prior to your procedure. ? - Take all your morning medication (except diabetic medications) with a sip of water prior to arrival. ? - You will not be able to drive home after your procedure. ? - Do NOT miss any doses of your blood thinner - if you should miss a dose please notify our office immediately. ? - If you feel as if you go back into normal rhythm prior to scheduled cardioversion, please notify our office immediately. If your procedure is canceled in the cardioversion suite you will be charged a cancellation fee. ? ?

## 2021-11-04 ENCOUNTER — Encounter: Payer: Self-pay | Admitting: Internal Medicine

## 2021-11-04 ENCOUNTER — Ambulatory Visit (HOSPITAL_COMMUNITY): Payer: Medicare Other | Admitting: Physician Assistant

## 2021-11-04 ENCOUNTER — Ambulatory Visit: Payer: Medicare Other | Admitting: Internal Medicine

## 2021-11-04 ENCOUNTER — Other Ambulatory Visit (INDEPENDENT_AMBULATORY_CARE_PROVIDER_SITE_OTHER): Payer: Medicare Other

## 2021-11-04 ENCOUNTER — Telehealth: Payer: Self-pay | Admitting: *Deleted

## 2021-11-04 VITALS — BP 104/66 | HR 67 | Ht 74.0 in | Wt 212.0 lb

## 2021-11-04 DIAGNOSIS — D649 Anemia, unspecified: Secondary | ICD-10-CM | POA: Diagnosis not present

## 2021-11-04 DIAGNOSIS — D696 Thrombocytopenia, unspecified: Secondary | ICD-10-CM

## 2021-11-04 DIAGNOSIS — K219 Gastro-esophageal reflux disease without esophagitis: Secondary | ICD-10-CM

## 2021-11-04 DIAGNOSIS — K5909 Other constipation: Secondary | ICD-10-CM | POA: Diagnosis not present

## 2021-11-04 DIAGNOSIS — R7989 Other specified abnormal findings of blood chemistry: Secondary | ICD-10-CM | POA: Diagnosis not present

## 2021-11-04 DIAGNOSIS — E559 Vitamin D deficiency, unspecified: Secondary | ICD-10-CM | POA: Diagnosis not present

## 2021-11-04 DIAGNOSIS — R351 Nocturia: Secondary | ICD-10-CM | POA: Diagnosis not present

## 2021-11-04 DIAGNOSIS — K625 Hemorrhage of anus and rectum: Secondary | ICD-10-CM | POA: Diagnosis not present

## 2021-11-04 DIAGNOSIS — E782 Mixed hyperlipidemia: Secondary | ICD-10-CM

## 2021-11-04 DIAGNOSIS — E039 Hypothyroidism, unspecified: Secondary | ICD-10-CM | POA: Diagnosis not present

## 2021-11-04 DIAGNOSIS — I10 Essential (primary) hypertension: Secondary | ICD-10-CM | POA: Diagnosis not present

## 2021-11-04 DIAGNOSIS — R194 Change in bowel habit: Secondary | ICD-10-CM

## 2021-11-04 DIAGNOSIS — R739 Hyperglycemia, unspecified: Secondary | ICD-10-CM | POA: Diagnosis not present

## 2021-11-04 LAB — COMPREHENSIVE METABOLIC PANEL
ALT: 11 U/L (ref 0–53)
AST: 14 U/L (ref 0–37)
Albumin: 4.5 g/dL (ref 3.5–5.2)
Alkaline Phosphatase: 72 U/L (ref 39–117)
BUN: 25 mg/dL — ABNORMAL HIGH (ref 6–23)
CO2: 29 mEq/L (ref 19–32)
Calcium: 9.5 mg/dL (ref 8.4–10.5)
Chloride: 101 mEq/L (ref 96–112)
Creatinine, Ser: 1.28 mg/dL (ref 0.40–1.50)
GFR: 51.29 mL/min — ABNORMAL LOW (ref >60.00)
Glucose, Bld: 100 mg/dL — ABNORMAL HIGH (ref 70–99)
Potassium: 4.1 mEq/L (ref 3.5–5.1)
Sodium: 138 mEq/L (ref 135–145)
Total Bilirubin: 0.8 mg/dL (ref 0.2–1.2)
Total Protein: 7 g/dL (ref 6.0–8.3)

## 2021-11-04 LAB — CBC
HCT: 39.1 % (ref 39.0–52.0)
Hemoglobin: 13.3 g/dL (ref 13.0–17.0)
MCHC: 34.1 g/dL (ref 30.0–36.0)
MCV: 93.9 fl (ref 78.0–100.0)
Platelets: 177 10*3/uL (ref 150.0–400.0)
RBC: 4.17 Mil/uL — ABNORMAL LOW (ref 4.22–5.81)
RDW: 13.8 % (ref 11.5–15.5)
WBC: 5.7 10*3/uL (ref 4.0–10.5)

## 2021-11-04 LAB — VITAMIN B12: Vitamin B-12: 831 pg/mL (ref 211–911)

## 2021-11-04 LAB — LIPID PANEL
Cholesterol: 147 mg/dL (ref 0–200)
HDL: 42.5 mg/dL (ref >39.00)
LDL Cholesterol: 85 mg/dL (ref 0–99)
NonHDL: 104.09
Total CHOL/HDL Ratio: 3
Triglycerides: 95 mg/dL (ref 0.0–149.0)
VLDL: 19 mg/dL (ref 0.0–40.0)

## 2021-11-04 LAB — PSA: PSA: 0.31 ng/mL (ref 0.10–4.00)

## 2021-11-04 LAB — TSH: TSH: 1.03 u[IU]/mL (ref 0.35–5.50)

## 2021-11-04 LAB — VITAMIN D 25 HYDROXY (VIT D DEFICIENCY, FRACTURES): VITD: 29.68 ng/mL — ABNORMAL LOW (ref 30.00–100.00)

## 2021-11-04 LAB — HEMOGLOBIN A1C: Hgb A1c MFr Bld: 6 % (ref 4.6–6.5)

## 2021-11-04 MED ORDER — SUTAB 1479-225-188 MG PO TABS: ORAL_TABLET | ORAL | 0 refills | Status: DC

## 2021-11-04 MED ORDER — LUBIPROSTONE 24 MCG PO CAPS: 24.0000 ug | ORAL_CAPSULE | Freq: Two times a day (BID) | ORAL | 0 refills | Status: DC

## 2021-11-04 NOTE — Patient Instructions (Signed)
You have been scheduled for a colonoscopy. Please follow written instructions given to you at your visit today.  ?Please pick up your prep supplies at the pharmacy within the next 1-3 days. ?If you use inhalers (even only as needed), please bring them with you on the day of your procedure. ? ?You will be contacted by our office prior to your procedure for directions on holding your xarelto.  If you do not hear from our office 1 week prior to your scheduled procedure, please call [phone] to discuss.  ? ?We have sent the following medications to your pharmacy for you to pick up at your convenience: ?Amitiza 24 mcg twice daily ? ?Continue pantoprazole. ? ?

## 2021-11-04 NOTE — Telephone Encounter (Signed)
Request for surgical clearance:     Endoscopy Procedure ? ?What type of surgery is being performed?     colonoscopy ? ?When is this surgery scheduled?     01/12/22 ? ?What type of clearance is required ?   Pharmacy ? ?Are there any medications that need to be held prior to surgery and how long? Xarelto, 2 days  ? ?Practice name and name of physician performing surgery?      [facility] ? ?What is your office phone and fax number?      Phone- [phone]  Fax- [phone] ? ?Anesthesia type (None, local, MAC, general) ?       MAC ? ?

## 2021-11-04 NOTE — Telephone Encounter (Signed)
Will preliminarily route to pharm but patient is in the process of awaiting cardioversion therefore cannot give final recommendations yet. Cardioversion is scheduled 12/05/21 and patient will not be allowed to hold blood thinner for at least 4 weeks following this, with ultimate decision to be determined upon follow-up after cardioversion. He has a follow-up visit scheduled 12/12/21 with Adline Peals PA-C. Will see if Afib clinic is comfortable making that determination when they see him in follow-up since request is for pharmacy/Eliquis clearance only. ?

## 2021-11-04 NOTE — Progress Notes (Signed)
? ?Subjective:  ? ? Patient ID: Tanner [name], male    DOB: [birth year], 87 y.o.   MRN: [id number] ? ?HPI ?Tanner [name] is an 87 year old male with a history of adenomatous polyps, left-sided colonic diverticulosis, constipation likely related to IBS, hypertension, hyperlipidemia and hypothyroidism, atrial fibrillation on Xarelto who is here for follow-up.  He was last seen in October 2021 and he is here alone today. ? ?He reports that for the last several months he has been feeling somewhat tired and sleepy.  He was discovered to be in atrial fibrillation without rapid ventricular response.  He has been on Xarelto and cardiology has plans for DC cardioversion on 12/05/2021.  He is not having chest pain or shortness of breath. ? ?From a bowel movement perspective he still dealing with constipation and bloating.  He feels abdominal distention at times.  No abdominal pain.  Amitiza is significantly better than Linzess but still he has days where he has no bowel movement or has incomplete evacuation.  Intermittent rectal bleeding typically after straining or after larger bowel movement.  He does use prune juice when Amitiza is less effective but this leads to multiple small incomplete stools typically after eating.  Upper GI symptoms including heartburn are well controlled with pantoprazole.  No dysphagia symptom.  He does worry about his colon and wonders about colonoscopy. ? ?His weight is stable ? ? ?Review of Systems ?As per HPI, otherwise negative ? ?Current Medications, Allergies, Past Medical History, Past Surgical History, Family History and Social History were reviewed in [employer] record. ?   ?Objective:  ? Physical Exam ?BP 104/66   Pulse 67   Ht '6\' 2"'$  (1.88 m)   Wt 212 lb (96.2 kg)   BMI 27.22 kg/m?  ?Blood pressure retaken manually at 130/80 ?Gen: awake, alert, NAD ?HEENT: anicteric  ?CV: Irregularly irregular, ?Pulm: CTA b/l ?Abd: soft, NT/ND, +BS throughout ?Ext: no  c/c/e ?Neuro: nonfocal ? ? ?  Latest Ref Rng & Units 10/26/2021  ?  8:11 AM 05/28/2021  ? 10:04 AM 04/22/2021  ? 10:30 AM  ?CBC  ?WBC 4.0 - 10.5 K/uL 8.0   6.0   7.1    ?Hemoglobin 13.0 - 17.0 g/dL 13.9   13.7   13.7    ?Hematocrit 39.0 - 52.0 % 41.9   41.2   41.5    ?Platelets 150 - 400 K/uL 189   153.0   116.0    ? ? ? ? ?   ?Assessment & Plan:  ?87 year old male with a history of adenomatous polyps, left-sided colonic diverticulosis, constipation likely related to IBS, hypertension, hyperlipidemia and hypothyroidism, atrial fibrillation on Xarelto who is here for follow-up.  ? ?Constipation/abdominal bloating/incomplete evacuation/intermittent rectal bleeding--symptoms better with Amitiza than Linzess but still ongoing constipation and incomplete bowel movement.  Intermittent rectal bleeding.  We discussed symptoms today and I recommended the following: ?--Increase Amitiza to 24 mcg twice daily with food; he should notify me if this results in diarrhea ?--Colonoscopy about 1 month after DC cardioversion, we will contact cardiology regarding 48-hour hold of Xarelto ? ?Will hold Xarelto 2 days prior to endoscopic procedures - will instruct when and how to resume after procedure. Benefits and risks of procedure explained including risks of bleeding, perforation, infection, missed lesions, reactions to medications and possible need for hospitalization and surgery for complications. Additional rare but real risk of stroke or other vascular clotting events off Xarelto also explained and need to seek urgent help if  any signs of these problems occur. Will communicate by phone or EMR with patient's  prescribing provider to confirm that holding Xarelto is reasonable in this case.  ? ?2.  History of colon polyps --history of adenomatous colon polyps, surveillance due around April 2024.  We are proceeding earlier due to ongoing altered bowel habits and intermittent rectal bleeding.  See 1 ? ?3.  GERD --on pantoprazole without  alarm symptoms and good control of reflux/heartburn ? ?30 minutes total spent today including patient facing time, coordination of care, reviewing medical history/procedures/pertinent radiology studies, and documentation of the encounter. ? ? ? ?

## 2021-11-04 NOTE — Telephone Encounter (Signed)
Will route to Dr. Hilarie Fredrickson and Dixon Boos CMA to let them know patient is in the process of awaiting cardioversion and follow-up, and that [facility] PA-C will address this clearance request at the 12/12/21 follow-up visit. I did add preop reminder to appt notes for that visit. ? ?Pharm input is still pending at this time so that it is available for Ricky at time of that visit (routed earlier below), but do not consider this a final recommendation until the 5/26 visit please. Will otherwise remove from preop box. ?

## 2021-11-05 NOTE — Telephone Encounter (Addendum)
Patient with diagnosis of Atrial Fibrillation on Xarelto for anticoagulation.  ? ?Procedure: Colonoscopy ?Date of procedure: 01/12/2022 ? ?CHA2DS2-VASc Score = 6  ?This indicates a 9.7% annual risk of stroke. ?The patient's score is based upon: ?CHF History: 0 ?HTN History: 1 ?Diabetes History: 0 ?Stroke History: 2 ?Vascular Disease History: 1 ?Age Score: 2 ?Gender Score: 0 ? ?CrCl 58 mL/min ?Platelet count 177K ? ? ?Patient is scheduled for cardioversion 12/05/2021 and requires uninterrupted anticoagulation through 01/02/2022. After that time, pt can hold Xarelto for 1 day prior to procedure. He should resume anticoagulation as soon as safely possible after due to elevated CV risk. ?

## 2021-11-07 NOTE — Progress Notes (Signed)
? ?Subjective:  ? ? Patient ID: Tanner [name], male    DOB: 11/20/1934, 87 y.o.   MRN: [id number] ? ?Chief Complaint  ?Patient presents with  ? Annual Exam  ? ? ?HPI ?Patient is in today for his annual physical exam and follow-up on chronic medical concerns.  No recent febrile illness or hospitalizations.  He does note he has been going in and out of A-fib more than usual and is scheduled for cardioversion soon.  He does note his resting pulse is hanging in the 70s but with minimal exertion he can jump up to the 110s.  He notes after that happens he has worsening fatigue.  He does complain of fatigue being his major concern over some months even before he started noticing the A-fib.  He does correlate it with adding melatonin.  He notes melatonin has helped him fall asleep but sometimes he still awakens and has trouble falling back to sleep again. Denies CP/SOB/HA/congestion/fevers/GI or GU c/o. Taking meds as prescribed  ? ?Past Medical History:  ?Diagnosis Date  ? Allergy   ? seasonal- mostly spring  ? Anemia 02/03/2011  ? ASTHMA, CHILDHOOD 03/17/2010  ? mostly as child  ? BCC (basal cell carcinoma) 12/08/2011  ? CAROTID ARTERY OCCLUSION, WITH INFARCTION 03/17/2010  ? CEREBROVASCULAR ACCIDENT 03/17/2010  ? partial loss of peripheral vision on left-"slight improved"  ? CHICKENPOX, HX OF 03/17/2010  ? Constipation 12/08/2014  ? Epistaxis 09/06/2013  ? FATIGUE 03/17/2010  ? Hearing loss 11/04/2010  ? bilateral  ? HYPERKALEMIA 05/27/2010  ? Hyperlipidemia   ? Hypertension   ? Hypothyroid 12/08/2011  ? Mixed hyperlipidemia 03/17/2010  ? Oral lesion 11/20/2014  ? Overweight(278.02) 07/01/2010  ? Peripheral neuropathy 08/18/2016  ? Personal history of other infectious and parasitic disease 03/17/2010  ? Preventative health care 08/23/2016  ? Skin lesion of right arm 11/20/2014  ? Sleep apnea   ? uses mouthpiece only  ? Thrombocytopenia ([place]) 04/05/2012  ? TOBACCO ABUSE, HX OF 03/17/2010  ? Tubular adenoma of colon 06/10/2011  ? ? ?Past  Surgical History:  ?Procedure Laterality Date  ? CARDIOVERSION N/A 08/11/2019  ? Procedure: CARDIOVERSION;  Surgeon: Josue Hector, MD;  Location: [facility];  Service: Cardiovascular;  Laterality: N/A;  ? CAROTID ENDARTERECTOMY Left February 13, 2013  ? cea  ? CATARACT EXTRACTION, BILATERAL Right   ? Cataract, and stigmatism  ? COLONOSCOPY  Dec. 2014  ? ENDARTERECTOMY Left 02/13/2013  ? Procedure: ENDARTERECTOMY CAROTID;  Surgeon: Rosetta Posner, MD;  Location: [facility];  Service: Vascular;  Laterality: Left;  ? EUS N/A 08/17/2013  ? Procedure: UPPER ENDOSCOPIC ULTRASOUND (EUS) LINEAR;  Surgeon: Milus Banister, MD;  Location: [facility];  Service: Endoscopy;  Laterality: N/A;  ? knee cartliage repair  1964, 1990  ? bilateral  ? [place]  ? removed  ? ROTATOR CUFF REPAIR    ? right  ? TONSILLECTOMY    ? UPPER GI ENDOSCOPY  Jan. 2015  ? ? ?Family History  ?Problem Relation Age of Onset  ? Alzheimer's disease Mother   ? Emphysema Sister   ?     cigarettes  ? Cancer Sister   ?     lung, tobacco   ? Heart disease Son   ? Coronary artery disease Brother   ? Heart attack Brother   ? Other Brother   ?     heart problems- from fathers side  ? Cancer Brother   ?  lung? smoker  ? Heart disease Brother   ?     Heart disease before age 41  ? Cancer Sister   ?     gyn  ? Heart disease Sister   ? Bipolar disorder Daughter   ? Obesity Daughter   ? Stroke Father   ? Heart disease Sister   ? Esophageal cancer Neg Hx   ? Colon cancer Neg Hx   ? Pancreatic cancer Neg Hx   ? Stomach cancer Neg Hx   ? ? ?Social History  ? ?Socioeconomic History  ? Marital status: Divorced  ?  Spouse name: Not on file  ? Number of children: 3  ? Years of education: 70yr  ? Highest education level: Bachelor's degree (e.g., BA, AB, BS)  ?Occupational History  ? Occupation: retired  ?  Employer: RETIRED  ?Tobacco Use  ? Smoking status: Former  ?  Packs/day: 2.00  ?  Years: 20.00  ?  Pack years: 40.00  ?  Types: Cigarettes  ?  Quit date:  07/19/1977  ?  Years since quitting: 44.3  ? Smokeless tobacco: Never  ?Vaping Use  ? Vaping Use: Never used  ?Substance and Sexual Activity  ? Alcohol use: Yes  ?  Alcohol/week: 10.0 standard drinks  ?  Types: 7 Glasses of wine, 2 Cans of beer, 1 Shots of liquor per week  ?  Comment: dinner wine daily  ? Drug use: No  ? Sexual activity: Not Currently  ?Other Topics Concern  ? Not on file  ?Social History Narrative  ? Pt lives at home alone.   ? Caffeine Use: very little  ? Heart healthy diet, is planning exercise  ?   ? 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 ?Social Determinants of Health  ? ?Financial Resource Strain: Low Risk   ? Difficulty of Paying Living Expenses: Not hard at all  ?Food Insecurity: No Food Insecurity  ? Worried About RCharity fundraiserin the Last Year: Never true  ? Ran Out of Food in the Last Year: Never true  ?Transportation Needs: No Transportation Needs  ? Lack of Transportation (Medical): No  ? Lack of Transportation (Non-Medical): No  ?Physical Activity: Sufficiently Active  ? Days of Exercise per Week: 4 days  ? Minutes of Exercise per Session: 40 min  ?Stress: No Stress Concern Present  ? Feeling of Stress : Not at all  ?Social Connections: Moderately Integrated  ? Frequency of Communication with Friends and Family: More than three times a week  ? Frequency of Social Gatherings with Friends and Family: More than three times a week  ? Attends Religious Services: More than 4 times per year  ? Active Member of Clubs or Organizations: Yes  ? Attends CArchivistMeetings: More than 4 times per year  ? Marital Status: Divorced  ?Intimate Partner Violence: Not At Risk  ? Fear of Current or Ex-Partner: No  ? Emotionally Abused: No  ? Physically Abused: No  ? Sexually Abused: No  ? ? ?Outpatient Medications Prior to Visit  ?Medication Sig Dispense Refill  ? acetaminophen (TYLENOL) 500 MG tablet Take 1,000 mg by mouth as needed for moderate pain or headache.     ? aspirin EC  81 MG tablet Take 81 mg by mouth every evening.     ? chlorthalidone (HYGROTON) 25 MG tablet TAKE 1 TABLET BY MOUTH IN  THE MORNING 90 tablet 3  ? gabapentin (NEURONTIN) 100 MG capsule  Takes 1 tablet by mouth as needed    ? levothyroxine (SYNTHROID) 88 MCG tablet TAKE 1 TABLET BY MOUTH  DAILY BEFORE BREAKFAST 90 tablet 3  ? lubiprostone (AMITIZA) 24 MCG capsule Take 1 capsule (24 mcg total) by mouth 2 (two) times daily with a meal. 180 capsule 0  ? metoprolol succinate (TOPROL-XL) 25 MG 24 hr tablet Take 1 tablet (25 mg total) by mouth daily. 90 tablet 1  ? pantoprazole (PROTONIX) 40 MG tablet TAKE 1 TABLET BY MOUTH  DAILY 90 tablet 3  ? ramipril (ALTACE) 10 MG capsule TAKE 1 CAPSULE BY MOUTH  TWICE DAILY 180 capsule 3  ? rivaroxaban (XARELTO) 20 MG TABS tablet TAKE 1 TABLET BY MOUTH  DAILY WITH SUPPER 90 tablet 2  ? rosuvastatin (CRESTOR) 40 MG tablet TAKE 1 TABLET BY MOUTH IN  THE EVENING 90 tablet 3  ? Sodium Sulfate-Mag Sulfate-KCl (SUTAB) [phone] MG TABS Use as directed for colonoscopy. MANUFACTURER CODES!! BIN: [id number] PCN: CN GROUP: [id number] MEMBER ID: [id number];[employer] AS SECONDARY INSURANCE ;NO PRIOR AUTHORIZATION 24 tablet 0  ? ?No facility-administered medications prior to visit.  ? ? ?No Known Allergies ? ?Review of Systems  ?Constitutional:  Positive for malaise/fatigue. Negative for chills and fever.  ?HENT:  Negative for congestion and hearing loss.   ?Eyes:  Negative for discharge.  ?Respiratory:  Negative for cough, sputum production and shortness of breath.   ?Cardiovascular:  Negative for chest pain, palpitations and leg swelling.  ?Gastrointestinal:  Positive for constipation. Negative for abdominal pain, blood in stool, diarrhea, heartburn, nausea and vomiting.  ?Genitourinary:  Negative for dysuria, frequency, hematuria and urgency.  ?Musculoskeletal:  Negative for back pain, falls and myalgias.  ?Skin:  Negative for rash.  ?Neurological:  Negative for dizziness, sensory change, loss of  consciousness, weakness and headaches.  ?Endo/Heme/Allergies:  Negative for environmental allergies. Does not bruise/bleed easily.  ?Psychiatric/Behavioral:  Negative for depression and suicidal ideas. The pat

## 2021-11-11 ENCOUNTER — Ambulatory Visit (INDEPENDENT_AMBULATORY_CARE_PROVIDER_SITE_OTHER): Payer: Medicare Other | Admitting: Family Medicine

## 2021-11-11 ENCOUNTER — Encounter: Payer: Self-pay | Admitting: Family Medicine

## 2021-11-11 VITALS — BP 130/84 | HR 77 | Resp 20 | Ht 74.0 in | Wt 214.2 lb

## 2021-11-11 DIAGNOSIS — G4733 Obstructive sleep apnea (adult) (pediatric): Secondary | ICD-10-CM

## 2021-11-11 DIAGNOSIS — E039 Hypothyroidism, unspecified: Secondary | ICD-10-CM | POA: Diagnosis not present

## 2021-11-11 DIAGNOSIS — Z0000 Encounter for general adult medical examination without abnormal findings: Secondary | ICD-10-CM

## 2021-11-11 DIAGNOSIS — R7989 Other specified abnormal findings of blood chemistry: Secondary | ICD-10-CM

## 2021-11-11 DIAGNOSIS — I10 Essential (primary) hypertension: Secondary | ICD-10-CM | POA: Diagnosis not present

## 2021-11-11 DIAGNOSIS — G4700 Insomnia, unspecified: Secondary | ICD-10-CM | POA: Insufficient documentation

## 2021-11-11 DIAGNOSIS — E782 Mixed hyperlipidemia: Secondary | ICD-10-CM | POA: Diagnosis not present

## 2021-11-11 DIAGNOSIS — K5900 Constipation, unspecified: Secondary | ICD-10-CM

## 2021-11-11 DIAGNOSIS — D126 Benign neoplasm of colon, unspecified: Secondary | ICD-10-CM

## 2021-11-11 NOTE — Assessment & Plan Note (Signed)
Has started 1000 IU Vitamin D for his slightly low reading ?

## 2021-11-11 NOTE — Patient Instructions (Signed)
Preventive Care 65 Years and Older, Male Preventive care refers to lifestyle choices and visits with your health care provider that can promote health and wellness. Preventive care visits are also called wellness exams. What can I expect for my preventive care visit? Counseling During your preventive care visit, your health care provider may ask about your: Medical history, including: Past medical problems. Family medical history. History of falls. Current health, including: Emotional well-being. Home life and relationship well-being. Sexual activity. Memory and ability to understand (cognition). Lifestyle, including: Alcohol, nicotine or tobacco, and drug use. Access to firearms. Diet, exercise, and sleep habits. Work and work environment. Sunscreen use. Safety issues such as seatbelt and bike helmet use. Physical exam Your health care provider will check your: Height and weight. These may be used to calculate your BMI (body mass index). BMI is a measurement that tells if you are at a healthy weight. Waist circumference. This measures the distance around your waistline. This measurement also tells if you are at a healthy weight and may help predict your risk of certain diseases, such as type 2 diabetes and high blood pressure. Heart rate and blood pressure. Body temperature. Skin for abnormal spots. What immunizations do I need?  Vaccines are usually given at various ages, according to a schedule. Your health care provider will recommend vaccines for you based on your age, medical history, and lifestyle or other factors, such as travel or where you work. What tests do I need? Screening Your health care provider may recommend screening tests for certain conditions. This may include: Lipid and cholesterol levels. Diabetes screening. This is done by checking your blood sugar (glucose) after you have not eaten for a while (fasting). Hepatitis C test. Hepatitis B test. HIV (human  immunodeficiency virus) test. STI (sexually transmitted infection) testing, if you are at risk. Lung cancer screening. Colorectal cancer screening. Prostate cancer screening. Abdominal aortic aneurysm (AAA) screening. You may need this if you are a current or former smoker. Talk with your health care provider about your test results, treatment options, and if necessary, the need for more tests. Follow these instructions at home: Eating and drinking  Eat a diet that includes fresh fruits and vegetables, whole grains, lean protein, and low-fat dairy products. Limit your intake of foods with high amounts of sugar, saturated fats, and salt. Take vitamin and mineral supplements as recommended by your health care provider. Do not drink alcohol if your health care provider tells you not to drink. If you drink alcohol: Limit how much you have to 0-2 drinks a day. Know how much alcohol is in your drink. In the [place] one drink equals one 12 oz bottle of beer (355 mL), one 5 oz glass of wine (148 mL), or one 1 oz glass of hard liquor (44 mL). Lifestyle Brush your teeth every morning and night with fluoride toothpaste. Floss one time each day. Exercise for at least 30 minutes 5 or more days each week. Do not use any products that contain nicotine or tobacco. These products include cigarettes, chewing tobacco, and vaping devices, such as e-cigarettes. If you need help quitting, ask your health care provider. Do not use drugs. If you are sexually active, practice safe sex. Use a condom or other form of protection to prevent STIs. Take aspirin only as told by your health care provider. Make sure that you understand how much to take and what form to take. Work with your health care provider to find out whether it is safe   and beneficial for you to take aspirin daily. Ask your health care provider if you need to take a cholesterol-lowering medicine (statin). Find healthy ways to manage stress, such  as: Meditation, yoga, or listening to music. Journaling. Talking to a trusted person. Spending time with friends and family. Safety Always wear your seat belt while driving or riding in a vehicle. Do not drive: If you have been drinking alcohol. Do not ride with someone who has been drinking. When you are tired or distracted. While texting. If you have been using any mind-altering substances or drugs. Wear a helmet and other protective equipment during sports activities. If you have firearms in your house, make sure you follow all gun safety procedures. Minimize exposure to UV radiation to reduce your risk of skin cancer. What's next? Visit your health care provider once a year for an annual wellness visit. Ask your health care provider how often you should have your eyes and teeth checked. Stay up to date on all vaccines. This information is not intended to replace advice given to you by your health care provider. Make sure you discuss any questions you have with your health care provider. Document Revised: 01/01/2021 Document Reviewed: 01/01/2021 Elsevier Patient Education  2023 [employer]  

## 2021-11-11 NOTE — Assessment & Plan Note (Signed)
Tolerating statin, encouraged heart healthy diet, avoid trans fats, minimize simple carbs and saturated fats. Increase exercise as tolerated 

## 2021-11-11 NOTE — Assessment & Plan Note (Addendum)
Patient encouraged to maintain heart healthy diet, regular exercise, adequate sleep. Consider daily probiotics. Take medications as prescribed. Labs reviewed. Has colonoscopy scheduled in June.  ?

## 2021-11-11 NOTE — Assessment & Plan Note (Signed)
Has spotted scant blood and is scheduled for a colonoscopy with GI soon.  ?

## 2021-11-11 NOTE — Assessment & Plan Note (Signed)
On Levothyroxine, continue to monitor 

## 2021-11-11 NOTE — Assessment & Plan Note (Addendum)
Keeps getting dry mouth with CPAP encouraged to try dry mouth gel by Spry or Biotene and hydrate well ?

## 2021-11-11 NOTE — Assessment & Plan Note (Signed)
Encouraged good sleep hygiene such as dark, quiet room. No blue/green glowing lights such as computer screens in bedroom. No alcohol or stimulants in evening. Cut down on caffeine as able. Regular exercise is helpful but not just prior to bed time. Using Melatonin 5 mg and he falls asleep but is noting increased fatigue. He is encouraged to try dropping to 2 mg and see if that helps ?

## 2021-11-11 NOTE — Assessment & Plan Note (Signed)
Well controlled, no changes to meds. Encouraged heart healthy diet such as the DASH diet and exercise as tolerated.  °

## 2021-11-11 NOTE — Assessment & Plan Note (Signed)
Following with GI just increased Amitiza from 8 to 24 mcg and it is helpful ?

## 2021-11-18 ENCOUNTER — Ambulatory Visit (HOSPITAL_COMMUNITY): Payer: Medicare Other | Admitting: Physician Assistant

## 2021-11-28 ENCOUNTER — Encounter (HOSPITAL_COMMUNITY): Payer: Self-pay | Admitting: Cardiovascular Disease

## 2021-11-28 NOTE — Progress Notes (Signed)
Attempted to obtain medical history via telephone, unable to reach at this time. I left a voicemail to return pre surgical testing department's phone call.  

## 2021-12-05 ENCOUNTER — Ambulatory Visit (HOSPITAL_COMMUNITY): Payer: Medicare Other | Admitting: Anesthesiology

## 2021-12-05 ENCOUNTER — Encounter (HOSPITAL_COMMUNITY)
Admission: RE | Disposition: A | Discharge status: Home / Self Care | Payer: Medicare Other | Source: Ambulatory Visit | Attending: Cardiovascular Disease

## 2021-12-05 ENCOUNTER — Ambulatory Visit (HOSPITAL_COMMUNITY)
Admission: RE | Admit: 2021-12-05 | Discharge: 2021-12-05 | Disposition: A | Discharge status: Home / Self Care | Payer: Medicare Other | Source: Ambulatory Visit | Attending: Cardiovascular Disease | Admitting: Cardiovascular Disease

## 2021-12-05 ENCOUNTER — Other Ambulatory Visit: Payer: Self-pay

## 2021-12-05 ENCOUNTER — Ambulatory Visit (HOSPITAL_BASED_OUTPATIENT_CLINIC_OR_DEPARTMENT_OTHER): Payer: Medicare Other | Admitting: Anesthesiology

## 2021-12-05 ENCOUNTER — Encounter (HOSPITAL_COMMUNITY): Payer: Self-pay | Admitting: Cardiovascular Disease

## 2021-12-05 ENCOUNTER — Ambulatory Visit (HOSPITAL_BASED_OUTPATIENT_CLINIC_OR_DEPARTMENT_OTHER)
Admission: RE | Admit: 2021-12-05 | Discharge: 2021-12-05 | Disposition: A | Discharge status: Home / Self Care | Payer: Medicare Other | Source: Ambulatory Visit | Attending: Physician Assistant | Admitting: Physician Assistant

## 2021-12-05 VITALS — BP 144/76 | HR 77 | Ht 74.0 in | Wt 213.2 lb

## 2021-12-05 DIAGNOSIS — I4819 Other persistent atrial fibrillation: Secondary | ICD-10-CM

## 2021-12-05 DIAGNOSIS — D6869 Other thrombophilia: Secondary | ICD-10-CM | POA: Diagnosis not present

## 2021-12-05 DIAGNOSIS — E785 Hyperlipidemia, unspecified: Secondary | ICD-10-CM | POA: Insufficient documentation

## 2021-12-05 DIAGNOSIS — I4891 Unspecified atrial fibrillation: Secondary | ICD-10-CM

## 2021-12-05 DIAGNOSIS — E039 Hypothyroidism, unspecified: Secondary | ICD-10-CM | POA: Insufficient documentation

## 2021-12-05 DIAGNOSIS — Z8673 Personal history of transient ischemic attack (TIA), and cerebral infarction without residual deficits: Secondary | ICD-10-CM | POA: Insufficient documentation

## 2021-12-05 DIAGNOSIS — Z87891 Personal history of nicotine dependence: Secondary | ICD-10-CM | POA: Insufficient documentation

## 2021-12-05 DIAGNOSIS — I10 Essential (primary) hypertension: Secondary | ICD-10-CM | POA: Insufficient documentation

## 2021-12-05 DIAGNOSIS — G4733 Obstructive sleep apnea (adult) (pediatric): Secondary | ICD-10-CM | POA: Diagnosis not present

## 2021-12-05 DIAGNOSIS — Z7901 Long term (current) use of anticoagulants: Secondary | ICD-10-CM | POA: Diagnosis not present

## 2021-12-05 DIAGNOSIS — G4730 Sleep apnea, unspecified: Secondary | ICD-10-CM | POA: Diagnosis not present

## 2021-12-05 HISTORY — PX: CARDIOVERSION: SHX1299

## 2021-12-05 LAB — BASIC METABOLIC PANEL
Anion gap: 7 (ref 5–15)
BUN: 16 mg/dL (ref 8–23)
CO2: 28 mmol/L (ref 22–32)
Calcium: 9.5 mg/dL (ref 8.9–10.3)
Chloride: 105 mmol/L (ref 98–111)
Creatinine, Ser: 1.13 mg/dL (ref 0.61–1.24)
GFR, Estimated: >60 mL/min (ref >60)
Glucose, Bld: 106 mg/dL — ABNORMAL HIGH (ref 70–99)
Potassium: 4.6 mmol/L (ref 3.5–5.1)
Sodium: 140 mmol/L (ref 135–145)

## 2021-12-05 LAB — CBC
HCT: 41.3 % (ref 39.0–52.0)
Hemoglobin: 13.3 g/dL (ref 13.0–17.0)
MCH: 30.9 pg (ref 26.0–34.0)
MCHC: 32.2 g/dL (ref 30.0–36.0)
MCV: 96 fL (ref 80.0–100.0)
Platelets: 158 10*3/uL (ref 150–400)
RBC: 4.3 MIL/uL (ref 4.22–5.81)
RDW: 13.8 % (ref 11.5–15.5)
WBC: 6 10*3/uL (ref 4.0–10.5)
nRBC: 0 % (ref 0.0–0.2)

## 2021-12-05 SURGERY — CARDIOVERSION
Anesthesia: Monitor Anesthesia Care

## 2021-12-05 MED ORDER — SODIUM CHLORIDE 0.9 % IV SOLN: INTRAVENOUS | Status: DC | PRN

## 2021-12-05 MED ORDER — LIDOCAINE 2% (20 MG/ML) 5 ML SYRINGE
INTRAMUSCULAR | Status: DC | PRN
Start: 2021-12-05 — End: 2021-12-05
  Administered 2021-12-05: 20 mg via INTRAVENOUS

## 2021-12-05 MED ORDER — PROPOFOL 10 MG/ML IV BOLUS
INTRAVENOUS | Status: DC | PRN
  Administered 2021-12-05: 70 mg via INTRAVENOUS

## 2021-12-05 NOTE — Progress Notes (Signed)
Primary Care Physician: [name], MD Primary Cardiologist: Dr [name] Cancel Primary Electrophysiologist: none Referring Physician: Dr Shirlee More is a 87 y.o. male with a history of HTN, tobacco abuse quit 2011, CVA 2011, HLD, hypothyroidism, OSA, and persistent atrial fibrillation who presents for follow up in the [facility]. The patient was initially diagnosed with atrial fibrillation 05/2019 at his PCP office after noting irregular readings on his FitBit. He is otherwise asymptomatic and rate controlled. Patient is on Xarelto for a CHADS2VASC score of 6. Patient is s/p DCCV on 08/11/19. Patient presented to the ED  10/26/21 feeling lightheaded and weak. His smart watch showed afib. ECG at the ED showed reasonably rate controlled afib and DCCV was not pursued at that time.   On follow up today, patient remains in rate controlled afib, scheduled for DCCV today. Patient denies any missed doses of anticoagulation in the past 3 weeks. He is fasting this AM.   Today, he denies symptoms of palpitations, chest pain, shortness of breath, orthopnea, PND, lower extremity edema, presyncope, syncope, bleeding, or neurologic sequela. The patient is tolerating medications without difficulties and is otherwise without complaint today.    Atrial Fibrillation Risk Factors:  he does have symptoms or diagnosis of sleep apnea. he is compliant with oral device therapy. he does not have a history of rheumatic fever. he does have a history of alcohol use. The patient does not have a history of early familial atrial fibrillation or other arrhythmias.  he has a BMI of Body mass index is 27.37 kg/m.[name] Filed Weights   12/05/21 0947  Weight: 96.7 kg      Family History  Problem Relation Age of Onset   Alzheimer's disease Mother    Emphysema Sister        cigarettes   Cancer Sister        lung, tobacco    Heart disease Son    Coronary artery disease Brother     Heart attack Brother    Other Brother        heart problems- from fathers side   Cancer Brother        lung? smoker   Heart disease Brother        Heart disease before age 66   Cancer Sister        gyn   Heart disease Sister    Bipolar disorder Daughter    Obesity Daughter    Stroke Father    Heart disease Sister    Esophageal cancer Neg Hx    Colon cancer Neg Hx    Pancreatic cancer Neg Hx    Stomach cancer Neg Hx      Atrial Fibrillation Management history:  Previous antiarrhythmic drugs: none Previous cardioversions: 08/11/19 Previous ablations: none CHADS2VASC score: 6 Anticoagulation history: Xarelto   Past Medical History:  Diagnosis Date   Allergy    seasonal- mostly spring   Anemia 02/03/2011   ASTHMA, CHILDHOOD 03/17/2010   mostly as child   BCC (basal cell carcinoma) 12/08/2011   CAROTID ARTERY OCCLUSION, WITH INFARCTION 03/17/2010   CEREBROVASCULAR ACCIDENT 03/17/2010   partial loss of peripheral vision on left-"slight improved"   CHICKENPOX, HX OF 03/17/2010   Constipation 12/08/2014   Epistaxis 09/06/2013   FATIGUE 03/17/2010   Hearing loss 11/04/2010   bilateral   HYPERKALEMIA 05/27/2010   Hyperlipidemia    Hypertension    Hypothyroid 12/08/2011   Mixed hyperlipidemia 03/17/2010   Oral lesion  11/20/2014   Overweight(278.02) 07/01/2010   Peripheral neuropathy 08/18/2016   Personal history of other infectious and parasitic disease 03/17/2010   Preventative health care 08/23/2016   Skin lesion of right arm 11/20/2014   Sleep apnea    uses mouthpiece only   Thrombocytopenia ([place]) 04/05/2012   TOBACCO ABUSE, HX OF 03/17/2010   Tubular adenoma of colon 06/10/2011   Past Surgical History:  Procedure Laterality Date   CARDIOVERSION N/A 08/11/2019   Procedure: CARDIOVERSION;  Surgeon: Josue Hector, MD;  Location: [facility] ENDOSCOPY;  Service: Cardiovascular;  Laterality: N/A;   CAROTID ENDARTERECTOMY Left February 13, 2013   cea   CATARACT EXTRACTION, BILATERAL Right     Cataract, and stigmatism   COLONOSCOPY  Dec. 2014   ENDARTERECTOMY Left 02/13/2013   Procedure: ENDARTERECTOMY CAROTID;  Surgeon: Rosetta Posner, MD;  Location: [place];  Service: Vascular;  Laterality: Left;   EUS N/A 08/17/2013   Procedure: UPPER ENDOSCOPIC ULTRASOUND (EUS) LINEAR;  Surgeon: Milus Banister, MD;  Location: [facility] ENDOSCOPY;  Service: Endoscopy;  Laterality: N/A;   knee cartliage repair  1964, 1990   bilateral   PILONIDAL CYST EXCISION  1968   removed   ROTATOR CUFF REPAIR     right   TONSILLECTOMY     UPPER GI ENDOSCOPY  Jan. 2015    Current Outpatient Medications  Medication Sig Dispense Refill   acetaminophen (TYLENOL) 500 MG tablet Take 1,000 mg by mouth as needed for moderate pain or headache.      aspirin EC 81 MG tablet Take 81 mg by mouth every evening.      chlorthalidone (HYGROTON) 25 MG tablet TAKE 1 TABLET BY MOUTH IN  THE MORNING 90 tablet 3   cholecalciferol (VITAMIN D) 25 MCG (1000 UNIT) tablet Take 1,000 Units by mouth daily.     levothyroxine (SYNTHROID) 88 MCG tablet TAKE 1 TABLET BY MOUTH  DAILY BEFORE BREAKFAST 90 tablet 3   lubiprostone (AMITIZA) 24 MCG capsule Take 1 capsule (24 mcg total) by mouth 2 (two) times daily with a meal. 180 capsule 0   metoprolol succinate (TOPROL-XL) 25 MG 24 hr tablet Take 1 tablet (25 mg total) by mouth daily. 90 tablet 1   Multiple Vitamins-Minerals (MULTIVITAMIN WITH MINERALS) tablet Take 1 tablet by mouth daily.     pantoprazole (PROTONIX) 40 MG tablet TAKE 1 TABLET BY MOUTH  DAILY 90 tablet 3   Polyethyl Glycol-Propyl Glycol (SYSTANE ULTRA OP) Place 1 drop into both eyes in the morning.     ramipril (ALTACE) 10 MG capsule TAKE 1 CAPSULE BY MOUTH  TWICE DAILY 180 capsule 3   rivaroxaban (XARELTO) 20 MG TABS tablet TAKE 1 TABLET BY MOUTH  DAILY WITH SUPPER 90 tablet 2   rosuvastatin (CRESTOR) 40 MG tablet TAKE 1 TABLET BY MOUTH IN  THE EVENING 90 tablet 3   TART CHERRY PO Take 1 tablet by mouth daily.     vitamin B-12  (CYANOCOBALAMIN) 1000 MCG tablet Take 2,000 mcg by mouth every Monday, Wednesday, and Friday.     gabapentin (NEURONTIN) 100 MG capsule Takes 1 tablet by mouth as needed (Patient not taking: Reported on 12/02/2021)     Sodium Sulfate-Mag Sulfate-KCl (SUTAB) [phone] MG TABS Use as directed for colonoscopy. MANUFACTURER CODES!! BIN: [id number] PCN: CN GROUP: [id number] MEMBER ID: [id number];[employer] AS SECONDARY INSURANCE ;NO PRIOR AUTHORIZATION (Patient not taking: Reported on 12/02/2021) 24 tablet 0   No current facility-administered medications for this encounter.    No Known Allergies  Social History   Socioeconomic History   Marital status: Divorced    Spouse name: Not on file   Number of children: 3   Years of education: 29yr   Highest education level: Bachelor's degree (e.g., BA, AB, BS)  Occupational History   Occupation: retired    EFish farm manager RETIRED  Tobacco Use   Smoking status: Former    Packs/day: 2.00    Years: 20.00    Pack years: 40.00    Types: Cigarettes    Quit date: 07/19/1977    Years since quitting: 44.4   Smokeless tobacco: Never  Vaping Use   Vaping Use: Never used  Substance and Sexual Activity   Alcohol use: Yes    Alcohol/week: 10.0 standard drinks    Types: 7 Glasses of wine, 2 Cans of beer, 1 Shots of liquor per week    Comment: dinner wine daily   Drug use: No   Sexual activity: Not Currently  Other Topics Concern   Not on file  Social History Narrative   Pt lives at home alone.    Caffeine Use: very little   Heart healthy diet, is planning exercise      Retired from lEvent organiser  Left handed   Social Determinants of Health   Financial Resource Strain: Low Risk    Difficulty of Paying Living Expenses: Not hard at all  Food Insecurity: No Food Insecurity   Worried About RCharity fundraiserin the Last Year: Never true   RArboriculturistin the Last Year: Never true  Transportation Needs: No Transportation Needs   Lack of Transportation  (Medical): No   Lack of Transportation (Non-Medical): No  Physical Activity: Sufficiently Active   Days of Exercise per Week: 4 days   Minutes of Exercise per Session: 40 min  Stress: No Stress Concern Present   Feeling of Stress : Not at all  Social Connections: Moderately Integrated   Frequency of Communication with Friends and Family: More than three times a week   Frequency of Social Gatherings with Friends and Family: More than three times a week   Attends Religious Services: More than 4 times per year   Active Member of [employer] Organizations: Yes   Attends CMusic therapist More than 4 times per year   Marital Status: Divorced  IHuman resources officerViolence: Not At Risk   Fear of Current or Ex-Partner: No   Emotionally Abused: No   Physically Abused: No   Sexually Abused: No     ROS- All systems are reviewed and negative except as per the HPI above.  Physical Exam: Vitals:   12/05/21 0947  BP: (!) 144/76  Pulse: 77  Weight: 96.7 kg  Height: '6\' 2"'$  (1.88 m)     GEN- The patient is a well appearing elderly male, alert and oriented x 3 today.   HEENT-head normocephalic, atraumatic, sclera clear, conjunctiva pink, hearing intact, trachea midline. Lungs- Clear to ausculation bilaterally, normal work of breathing Heart- irregular rate and rhythm, no murmurs, rubs or gallops  GI- soft, NT, ND, + BS Extremities- no clubbing, cyanosis, or edema MS- no significant deformity or atrophy Skin- no rash or lesion Psych- euthymic mood, full affect Neuro- strength and sensation are intact   Wt Readings from Last 3 Encounters:  12/05/21 96.7 kg  11/11/21 97.2 kg  11/04/21 96.2 kg    EKG today demonstrates  Afib Vent. rate 77 BPM PR interval * ms QRS duration 86 ms QT/QTcB  374/423 ms  Echo 07/18/20 demonstrated   1. Left ventricular ejection fraction, by estimation, is 60 to 65%. Left  ventricular ejection fraction by 3D volume is 61 %. The left ventricle has  normal function. The left ventricle has no regional wall motion  abnormalities. Left ventricular diastolic   parameters are indeterminate. The average left ventricular global  longitudinal strain is -22.9 %. The global longitudinal strain is normal.   2. Right ventricular systolic function is normal. The right ventricular  size is mildly enlarged.   3. The mitral valve is normal in structure. Mild mitral valve  regurgitation. No evidence of mitral stenosis.   4. The aortic valve is tricuspid. There is moderate calcification of the  aortic valve. There is moderate thickening of the aortic valve. Aortic  valve regurgitation is trivial. Very mild aortic valve stenosis. Aortic  valve area, by VTI measures 2.33 cm.   Aortic valve mean gradient measures 7.0 mmHg. Aortic valve Vmax measures 1.76 m/s.   5. Aortic dilatation noted. There is borderline dilatation of the aortic  root, measuring 37 mm.   6. The inferior vena cava is normal in size with greater than 50%  respiratory variability, suggesting right atrial pressure of 3 mmHg.   Epic records are reviewed at length today  CHA2DS2-VASc Score = 6  The patient's score is based upon: CHF History: 0 HTN History: 1 Diabetes History: 0 Stroke History: 2 Vascular Disease History: 1 Age Score: 2 Gender Score: 0       ASSESSMENT AND PLAN: 1. Persistent Atrial Fibrillation (ICD10:  I48.19) The patient's CHA2DS2-VASc score is 6, indicating a 9.7% annual risk of stroke.   Patient remains in persistent afib. Scheduled for DCCV today. Continue Toprol 25 mg daily, decrease back to 12.5 mg daily the day prior to DCCV.  Continue Xarelto 20 mg daily, patient denies any missed doses in the past 3 weeks. Will need to continue this 4 weeks post DCCV with no missed doses.  Continue ASA per Dr [name] Cancel for carotid disease.   2. Secondary Hypercoagulable State (ICD10:  D68.69) The patient is at significant risk for stroke/thromboembolism based upon his  CHA2DS2-VASc Score of 6.  Continue Rivaroxaban (Xarelto).   3. Obstructive sleep apnea Encouraged compliance with oral device. Followed by Dr Ander Slade.  4. HTN Stable, no changes today.   Follow up in the AF clinic post DCCV as scheduled.    [facility] [address] [place], [place] [address] [phone] 12/05/2021 10:14 AM

## 2021-12-05 NOTE — CV Procedure (Signed)
[facility]: Anesthesia:  Propofol Dr Ola Spurr  On Rx Xarelto no missed doses  [place] x 2 150 J then 200 J biphasic converted from afib to NSR at rate 55 BPM  No immediate neurologic sequelae  Jenkins Rouge MD [facility]

## 2021-12-05 NOTE — Anesthesia Procedure Notes (Signed)
Procedure Name: General with mask airway Date/Time: 12/05/2021 12:04 PM Performed by: Janace Litten, CRNA Pre-anesthesia Checklist: Patient identified, Emergency Drugs available, Suction available and Patient being monitored Patient Re-evaluated:Patient Re-evaluated prior to induction Oxygen Delivery Method: Ambu bag Preoxygenation: Pre-oxygenation with 100% oxygen Induction Type: IV induction

## 2021-12-05 NOTE — Discharge Instructions (Signed)

## 2021-12-05 NOTE — Anesthesia Preprocedure Evaluation (Signed)
Anesthesia Evaluation  Patient identified by MRN, date of birth, ID band Patient awake    Reviewed: Allergy & Precautions, NPO status   Airway Mallampati: II  TM Distance: >3 FB     Dental   Pulmonary asthma , sleep apnea , former smoker,    breath sounds clear to auscultation       Cardiovascular hypertension, + dysrhythmias Atrial Fibrillation  Rhythm:Irregular Rate:Normal     Neuro/Psych  Neuromuscular disease CVA    GI/Hepatic negative GI ROS, Neg liver ROS,   Endo/Other  Hypothyroidism   Renal/GU negative Renal ROS     Musculoskeletal  (+) Arthritis ,   Abdominal   Peds  Hematology   Anesthesia Other Findings   Reproductive/Obstetrics                             Anesthesia Physical Anesthesia Plan  ASA: 3  Anesthesia Plan: MAC   Post-op Pain Management:    Induction: Intravenous  PONV Risk Score and Plan: Treatment may vary due to age or medical condition  Airway Management Planned: Nasal Cannula and Simple Face Mask  Additional Equipment:   Intra-op Plan:   Post-operative Plan:   Informed Consent: I have reviewed the patients History and Physical, chart, labs and discussed the procedure including the risks, benefits and alternatives for the proposed anesthesia with the patient or authorized representative who has indicated his/her understanding and acceptance.     Dental advisory given  Plan Discussed with: CRNA and Anesthesiologist  Anesthesia Plan Comments:         Anesthesia Quick Evaluation

## 2021-12-05 NOTE — Interval H&P Note (Signed)
History and Physical Interval Note:  12/05/2021 11:09 AM  Tanner [name]  has presented today for surgery, with the diagnosis of AFIB.  The various methods of treatment have been discussed with the patient and family. After consideration of risks, benefits and other options for treatment, the patient has consented to  Procedure(s): CARDIOVERSION (N/A) as a surgical intervention.  The patient's history has been reviewed, patient examined, no change in status, stable for surgery.  I have reviewed the patient's chart and labs.  Questions were answered to the patient's satisfaction.     Jenkins Rouge

## 2021-12-05 NOTE — Anesthesia Postprocedure Evaluation (Signed)
Anesthesia Post Note  Patient: Tanner [name]  Procedure(s) Performed: CARDIOVERSION     Patient location during evaluation: PACU Anesthesia Type: General Level of consciousness: awake and alert Pain management: pain level controlled Vital Signs Assessment: post-procedure vital signs reviewed and stable Respiratory status: spontaneous breathing, nonlabored ventilation, respiratory function stable and patient connected to nasal cannula oxygen Cardiovascular status: blood pressure returned to baseline and stable Postop Assessment: no apparent nausea or vomiting Anesthetic complications: no   No notable events documented.  Last Vitals:  Vitals:   12/05/21 1240 12/05/21 1245  BP: (!) (P) 116/58 (!) 116/58  Pulse: (!) 49 (!) 51  Resp: 10 (!) 21  Temp:    SpO2: 98% 100%    Last Pain:  Vitals:   12/05/21 1240  TempSrc:   PainSc: (P) 0-No pain                 Tiajuana Amass

## 2021-12-05 NOTE — Transfer of Care (Signed)
Immediate Anesthesia Transfer of Care Note  Patient: Tanner [name]  Procedure(s) Performed: CARDIOVERSION  Patient Location: PACU and Endoscopy Unit  Anesthesia Type:General  Level of Consciousness: drowsy, patient cooperative and responds to stimulation  Airway & Oxygen Therapy: Patient Spontanous Breathing  Post-op Assessment: Report given to RN and Post -op Vital signs reviewed and stable  Post vital signs: Reviewed and stable  Last Vitals:  Vitals Value Taken Time  BP    Temp    Pulse    Resp    SpO2      Last Pain:  Vitals:   12/05/21 1106  TempSrc: Temporal  PainSc: 0-No pain         Complications: No notable events documented.

## 2021-12-05 NOTE — H&P (View-Only) (Signed)
Primary Care Physician: [name], MD Primary Cardiologist: Dr [name] Cancel Primary Electrophysiologist: none Referring Physician: Dr Shirlee More is a 87 y.o. male with a history of HTN, tobacco abuse quit 2011, CVA 2011, HLD, hypothyroidism, OSA, and persistent atrial fibrillation who presents for follow up in the [facility]. The patient was initially diagnosed with atrial fibrillation 05/2019 at his PCP office after noting irregular readings on his FitBit. He is otherwise asymptomatic and rate controlled. Patient is on Xarelto for a CHADS2VASC score of 6. Patient is s/p DCCV on 08/11/19. Patient presented to the ED  10/26/21 feeling lightheaded and weak. His smart watch showed afib. ECG at the ED showed reasonably rate controlled afib and DCCV was not pursued at that time.   On follow up today, patient remains in rate controlled afib, scheduled for DCCV today. Patient denies any missed doses of anticoagulation in the past 3 weeks. He is fasting this AM.   Today, he denies symptoms of palpitations, chest pain, shortness of breath, orthopnea, PND, lower extremity edema, presyncope, syncope, bleeding, or neurologic sequela. The patient is tolerating medications without difficulties and is otherwise without complaint today.    Atrial Fibrillation Risk Factors:  he does have symptoms or diagnosis of sleep apnea. he is compliant with oral device therapy. he does not have a history of rheumatic fever. he does have a history of alcohol use. The patient does not have a history of early familial atrial fibrillation or other arrhythmias.  he has a BMI of Body mass index is 27.37 kg/m.[name] Filed Weights   12/05/21 0947  Weight: 96.7 kg      Family History  Problem Relation Age of Onset   Alzheimer's disease Mother    Emphysema Sister        cigarettes   Cancer Sister        lung, tobacco    Heart disease Son    Coronary artery disease Brother     Heart attack Brother    Other Brother        heart problems- from fathers side   Cancer Brother        lung? smoker   Heart disease Brother        Heart disease before age 12   Cancer Sister        gyn   Heart disease Sister    Bipolar disorder Daughter    Obesity Daughter    Stroke Father    Heart disease Sister    Esophageal cancer Neg Hx    Colon cancer Neg Hx    Pancreatic cancer Neg Hx    Stomach cancer Neg Hx      Atrial Fibrillation Management history:  Previous antiarrhythmic drugs: none Previous cardioversions: 08/11/19 Previous ablations: none CHADS2VASC score: 6 Anticoagulation history: Xarelto   Past Medical History:  Diagnosis Date   Allergy    seasonal- mostly spring   Anemia 02/03/2011   ASTHMA, CHILDHOOD 03/17/2010   mostly as child   BCC (basal cell carcinoma) 12/08/2011   CAROTID ARTERY OCCLUSION, WITH INFARCTION 03/17/2010   CEREBROVASCULAR ACCIDENT 03/17/2010   partial loss of peripheral vision on left-"slight improved"   CHICKENPOX, HX OF 03/17/2010   Constipation 12/08/2014   Epistaxis 09/06/2013   FATIGUE 03/17/2010   Hearing loss 11/04/2010   bilateral   HYPERKALEMIA 05/27/2010   Hyperlipidemia    Hypertension    Hypothyroid 12/08/2011   Mixed hyperlipidemia 03/17/2010   Oral lesion  11/20/2014   Overweight(278.02) 07/01/2010   Peripheral neuropathy 08/18/2016   Personal history of other infectious and parasitic disease 03/17/2010   Preventative health care 08/23/2016   Skin lesion of right arm 11/20/2014   Sleep apnea    uses mouthpiece only   Thrombocytopenia ([place]) 04/05/2012   TOBACCO ABUSE, HX OF 03/17/2010   Tubular adenoma of colon 06/10/2011   Past Surgical History:  Procedure Laterality Date   CARDIOVERSION N/A 08/11/2019   Procedure: CARDIOVERSION;  Surgeon: Josue Hector, MD;  Location: [facility] ENDOSCOPY;  Service: Cardiovascular;  Laterality: N/A;   CAROTID ENDARTERECTOMY Left February 13, 2013   cea   CATARACT EXTRACTION, BILATERAL Right     Cataract, and stigmatism   COLONOSCOPY  Dec. 2014   ENDARTERECTOMY Left 02/13/2013   Procedure: ENDARTERECTOMY CAROTID;  Surgeon: Rosetta Posner, MD;  Location: [place];  Service: Vascular;  Laterality: Left;   EUS N/A 08/17/2013   Procedure: UPPER ENDOSCOPIC ULTRASOUND (EUS) LINEAR;  Surgeon: Milus Banister, MD;  Location: [facility] ENDOSCOPY;  Service: Endoscopy;  Laterality: N/A;   knee cartliage repair  1964, 1990   bilateral   PILONIDAL CYST EXCISION  1968   removed   ROTATOR CUFF REPAIR     right   TONSILLECTOMY     UPPER GI ENDOSCOPY  Jan. 2015    Current Outpatient Medications  Medication Sig Dispense Refill   acetaminophen (TYLENOL) 500 MG tablet Take 1,000 mg by mouth as needed for moderate pain or headache.      aspirin EC 81 MG tablet Take 81 mg by mouth every evening.      chlorthalidone (HYGROTON) 25 MG tablet TAKE 1 TABLET BY MOUTH IN  THE MORNING 90 tablet 3   cholecalciferol (VITAMIN D) 25 MCG (1000 UNIT) tablet Take 1,000 Units by mouth daily.     levothyroxine (SYNTHROID) 88 MCG tablet TAKE 1 TABLET BY MOUTH  DAILY BEFORE BREAKFAST 90 tablet 3   lubiprostone (AMITIZA) 24 MCG capsule Take 1 capsule (24 mcg total) by mouth 2 (two) times daily with a meal. 180 capsule 0   metoprolol succinate (TOPROL-XL) 25 MG 24 hr tablet Take 1 tablet (25 mg total) by mouth daily. 90 tablet 1   Multiple Vitamins-Minerals (MULTIVITAMIN WITH MINERALS) tablet Take 1 tablet by mouth daily.     pantoprazole (PROTONIX) 40 MG tablet TAKE 1 TABLET BY MOUTH  DAILY 90 tablet 3   Polyethyl Glycol-Propyl Glycol (SYSTANE ULTRA OP) Place 1 drop into both eyes in the morning.     ramipril (ALTACE) 10 MG capsule TAKE 1 CAPSULE BY MOUTH  TWICE DAILY 180 capsule 3   rivaroxaban (XARELTO) 20 MG TABS tablet TAKE 1 TABLET BY MOUTH  DAILY WITH SUPPER 90 tablet 2   rosuvastatin (CRESTOR) 40 MG tablet TAKE 1 TABLET BY MOUTH IN  THE EVENING 90 tablet 3   TART CHERRY PO Take 1 tablet by mouth daily.     vitamin B-12  (CYANOCOBALAMIN) 1000 MCG tablet Take 2,000 mcg by mouth every Monday, Wednesday, and Friday.     gabapentin (NEURONTIN) 100 MG capsule Takes 1 tablet by mouth as needed (Patient not taking: Reported on 12/02/2021)     Sodium Sulfate-Mag Sulfate-KCl (SUTAB) [phone] MG TABS Use as directed for colonoscopy. MANUFACTURER CODES!! BIN: [id number] PCN: CN GROUP: [id number] MEMBER ID: [id number];[employer] AS SECONDARY INSURANCE ;NO PRIOR AUTHORIZATION (Patient not taking: Reported on 12/02/2021) 24 tablet 0   No current facility-administered medications for this encounter.    No Known Allergies  Social History   Socioeconomic History   Marital status: Divorced    Spouse name: Not on file   Number of children: 3   Years of education: 61yr   Highest education level: Bachelor's degree (e.g., BA, AB, BS)  Occupational History   Occupation: retired    EF[employer] farm manager RETIRED  Tobacco Use   Smoking status: Former    Packs/day: 2.00    Years: 20.00    Pack years: 40.00    Types: Cigarettes    Quit date: 07/19/1977    Years since quitting: 44.4   Smokeless tobacco: Never  Vaping Use   Vaping Use: Never used  Substance and Sexual Activity   Alcohol use: Yes    Alcohol/week: 10.0 standard drinks    Types: 7 Glasses of wine, 2 Cans of beer, 1 Shots of liquor per week    Comment: dinner wine daily   Drug use: No   Sexual activity: Not Currently  Other Topics Concern   Not on file  Social History Narrative   Pt lives at home alone.    Caffeine Use: very little   Heart healthy diet, is planning exercise      Retired from lEvent organiser  Left handed   Social Determinants of Health   Financial Resource Strain: Low Risk    Difficulty of Paying Living Expenses: Not hard at all  Food Insecurity: No Food Insecurity   Worried About RCharity fundraiserin the Last Year: Never true   RArboriculturistin the Last Year: Never true  Transportation Needs: No Transportation Needs   Lack of Transportation  (Medical): No   Lack of Transportation (Non-Medical): No  Physical Activity: Sufficiently Active   Days of Exercise per Week: 4 days   Minutes of Exercise per Session: 40 min  Stress: No Stress Concern Present   Feeling of Stress : Not at all  Social Connections: Moderately Integrated   Frequency of Communication with Friends and Family: More than three times a week   Frequency of Social Gatherings with Friends and Family: More than three times a week   Attends Religious Services: More than 4 times per year   Active Member of [employer] Organizations: Yes   Attends CMusic therapist More than 4 times per year   Marital Status: Divorced  IHuman resources officerViolence: Not At Risk   Fear of Current or Ex-Partner: No   Emotionally Abused: No   Physically Abused: No   Sexually Abused: No     ROS- All systems are reviewed and negative except as per the HPI above.  Physical Exam: Vitals:   12/05/21 0947  BP: (!) 144/76  Pulse: 77  Weight: 96.7 kg  Height: '6\' 2"'$  (1.88 m)     GEN- The patient is a well appearing elderly male, alert and oriented x 3 today.   HEENT-head normocephalic, atraumatic, sclera clear, conjunctiva pink, hearing intact, trachea midline. Lungs- Clear to ausculation bilaterally, normal work of breathing Heart- irregular rate and rhythm, no murmurs, rubs or gallops  GI- soft, NT, ND, + BS Extremities- no clubbing, cyanosis, or edema MS- no significant deformity or atrophy Skin- no rash or lesion Psych- euthymic mood, full affect Neuro- strength and sensation are intact   Wt Readings from Last 3 Encounters:  12/05/21 96.7 kg  11/11/21 97.2 kg  11/04/21 96.2 kg    EKG today demonstrates  Afib Vent. rate 77 BPM PR interval * ms QRS duration 86 ms QT/QTcB  374/423 ms  Echo 07/18/20 demonstrated   1. Left ventricular ejection fraction, by estimation, is 60 to 65%. Left  ventricular ejection fraction by 3D volume is 61 %. The left ventricle has  normal function. The left ventricle has no regional wall motion  abnormalities. Left ventricular diastolic   parameters are indeterminate. The average left ventricular global  longitudinal strain is -22.9 %. The global longitudinal strain is normal.   2. Right ventricular systolic function is normal. The right ventricular  size is mildly enlarged.   3. The mitral valve is normal in structure. Mild mitral valve  regurgitation. No evidence of mitral stenosis.   4. The aortic valve is tricuspid. There is moderate calcification of the  aortic valve. There is moderate thickening of the aortic valve. Aortic  valve regurgitation is trivial. Very mild aortic valve stenosis. Aortic  valve area, by VTI measures 2.33 cm.   Aortic valve mean gradient measures 7.0 mmHg. Aortic valve Vmax measures 1.76 m/s.   5. Aortic dilatation noted. There is borderline dilatation of the aortic  root, measuring 37 mm.   6. The inferior vena cava is normal in size with greater than 50%  respiratory variability, suggesting right atrial pressure of 3 mmHg.   Epic records are reviewed at length today  CHA2DS2-VASc Score = 6  The patient's score is based upon: CHF History: 0 HTN History: 1 Diabetes History: 0 Stroke History: 2 Vascular Disease History: 1 Age Score: 2 Gender Score: 0       ASSESSMENT AND PLAN: 1. Persistent Atrial Fibrillation (ICD10:  I48.19) The patient's CHA2DS2-VASc score is 6, indicating a 9.7% annual risk of stroke.   Patient remains in persistent afib. Scheduled for DCCV today. Continue Toprol 25 mg daily, decrease back to 12.5 mg daily the day prior to DCCV.  Continue Xarelto 20 mg daily, patient denies any missed doses in the past 3 weeks. Will need to continue this 4 weeks post DCCV with no missed doses.  Continue ASA per Dr [name] Cancel for carotid disease.   2. Secondary Hypercoagulable State (ICD10:  D68.69) The patient is at significant risk for stroke/thromboembolism based upon his  CHA2DS2-VASc Score of 6.  Continue Rivaroxaban (Xarelto).   3. Obstructive sleep apnea Encouraged compliance with oral device. Followed by Dr Ander Slade.  4. HTN Stable, no changes today.   Follow up in the AF clinic post DCCV as scheduled.    [facility] [address] [place], [place] [address] [phone] 12/05/2021 10:14 AM

## 2021-12-12 ENCOUNTER — Ambulatory Visit (HOSPITAL_COMMUNITY)
Admission: RE | Admit: 2021-12-12 | Discharge: 2021-12-12 | Disposition: A | Discharge status: Home / Self Care | Payer: Medicare Other | Source: Ambulatory Visit | Attending: Physician Assistant | Admitting: Physician Assistant

## 2021-12-12 ENCOUNTER — Encounter (HOSPITAL_COMMUNITY): Payer: Self-pay | Admitting: Physician Assistant

## 2021-12-12 VITALS — BP 126/62 | HR 55 | Ht 74.0 in | Wt 220.0 lb

## 2021-12-12 DIAGNOSIS — I639 Cerebral infarction, unspecified: Secondary | ICD-10-CM | POA: Insufficient documentation

## 2021-12-12 DIAGNOSIS — E785 Hyperlipidemia, unspecified: Secondary | ICD-10-CM | POA: Insufficient documentation

## 2021-12-12 DIAGNOSIS — I4819 Other persistent atrial fibrillation: Secondary | ICD-10-CM | POA: Diagnosis present

## 2021-12-12 DIAGNOSIS — D6869 Other thrombophilia: Secondary | ICD-10-CM | POA: Diagnosis not present

## 2021-12-12 DIAGNOSIS — I10 Essential (primary) hypertension: Secondary | ICD-10-CM | POA: Diagnosis not present

## 2021-12-12 DIAGNOSIS — E039 Hypothyroidism, unspecified: Secondary | ICD-10-CM | POA: Diagnosis not present

## 2021-12-12 DIAGNOSIS — G4733 Obstructive sleep apnea (adult) (pediatric): Secondary | ICD-10-CM | POA: Insufficient documentation

## 2021-12-12 DIAGNOSIS — Z7982 Long term (current) use of aspirin: Secondary | ICD-10-CM | POA: Diagnosis not present

## 2021-12-12 DIAGNOSIS — Z7901 Long term (current) use of anticoagulants: Secondary | ICD-10-CM | POA: Diagnosis not present

## 2021-12-12 MED ORDER — METOPROLOL SUCCINATE ER 25 MG PO TB24: 12.5000 mg | ORAL_TABLET | Freq: Every day | ORAL | 1 refills | Status: DC

## 2021-12-12 MED ORDER — FUROSEMIDE 20 MG PO TABS: ORAL_TABLET | ORAL | 11 refills | Status: DC

## 2021-12-12 NOTE — Progress Notes (Addendum)
Primary Care Physician: [name], MD Primary Cardiologist: Dr [name] Cancel Primary Electrophysiologist: none Referring Physician: Dr Shirlee More is a 87 y.o. male with a history of HTN, tobacco abuse quit 2011, CVA 2011, HLD, hypothyroidism, OSA, and persistent atrial fibrillation who presents for follow up in the [facility]. The patient was initially diagnosed with atrial fibrillation 05/2019 at his PCP office after noting irregular readings on his FitBit. He is otherwise asymptomatic and rate controlled. Patient is on Xarelto for a CHADS2VASC score of 6. Patient is s/p DCCV on 08/11/19. Patient presented to the ED  10/26/21 feeling lightheaded and weak. His smart watch showed afib. ECG at the ED showed reasonably rate controlled afib and DCCV was not pursued at that time.   On follow up today, s/p DCCV on 12/05/21. Patient remains in [place] today. He has noted some ankle edema since the DCCV. No bleeding issues on anticoagulation.   Today, he denies symptoms of palpitations, chest pain, shortness of breath, orthopnea, PND, presyncope, syncope, bleeding, or neurologic sequela. The patient is tolerating medications without difficulties and is otherwise without complaint today.    Atrial Fibrillation Risk Factors:  he does have symptoms or diagnosis of sleep apnea. he is compliant with oral device therapy. he does not have a history of rheumatic fever. he does have a history of alcohol use. The patient does not have a history of early familial atrial fibrillation or other arrhythmias.  he has a BMI of Body mass index is 28.25 kg/m.[name] Filed Weights   12/12/21 0825  Weight: 99.8 kg    Family History  Problem Relation Age of Onset   Alzheimer's disease Mother    Emphysema Sister        cigarettes   Cancer Sister        lung, tobacco    Heart disease Son    Coronary artery disease Brother    Heart attack Brother    Other Brother        heart  problems- from fathers side   Cancer Brother        lung? smoker   Heart disease Brother        Heart disease before age 69   Cancer Sister        gyn   Heart disease Sister    Bipolar disorder Daughter    Obesity Daughter    Stroke Father    Heart disease Sister    Esophageal cancer Neg Hx    Colon cancer Neg Hx    Pancreatic cancer Neg Hx    Stomach cancer Neg Hx      Atrial Fibrillation Management history:  Previous antiarrhythmic drugs: none Previous cardioversions: 08/11/19, 12/05/21 Previous ablations: none CHADS2VASC score: 6 Anticoagulation history: Xarelto   Past Medical History:  Diagnosis Date   Allergy    seasonal- mostly spring   Anemia 02/03/2011   ASTHMA, CHILDHOOD 03/17/2010   mostly as child   BCC (basal cell carcinoma) 12/08/2011   CAROTID ARTERY OCCLUSION, WITH INFARCTION 03/17/2010   CEREBROVASCULAR ACCIDENT 03/17/2010   partial loss of peripheral vision on left-"slight improved"   CHICKENPOX, HX OF 03/17/2010   Constipation 12/08/2014   Epistaxis 09/06/2013   FATIGUE 03/17/2010   Hearing loss 11/04/2010   bilateral   HYPERKALEMIA 05/27/2010   Hyperlipidemia    Hypertension    Hypothyroid 12/08/2011   Mixed hyperlipidemia 03/17/2010   Oral lesion 11/20/2014   Overweight(278.02) 07/01/2010   Peripheral  neuropathy 08/18/2016   Personal history of other infectious and parasitic disease 03/17/2010   Preventative health care 08/23/2016   Skin lesion of right arm 11/20/2014   Sleep apnea    uses mouthpiece only   Thrombocytopenia ([place]) 04/05/2012   TOBACCO ABUSE, HX OF 03/17/2010   Tubular adenoma of colon 06/10/2011   Past Surgical History:  Procedure Laterality Date   CARDIOVERSION N/A 08/11/2019   Procedure: CARDIOVERSION;  Surgeon: Josue Hector, MD;  Location: [facility] ENDOSCOPY;  Service: Cardiovascular;  Laterality: N/A;   CARDIOVERSION N/A 12/05/2021   Procedure: CARDIOVERSION;  Surgeon: Josue Hector, MD;  Location: [facility] ENDOSCOPY;  Service: Cardiovascular;   Laterality: N/A;   CAROTID ENDARTERECTOMY Left February 13, 2013   cea   CATARACT EXTRACTION, BILATERAL Right    Cataract, and stigmatism   COLONOSCOPY  Dec. 2014   ENDARTERECTOMY Left 02/13/2013   Procedure: ENDARTERECTOMY CAROTID;  Surgeon: Rosetta Posner, MD;  Location: [place];  Service: Vascular;  Laterality: Left;   EUS N/A 08/17/2013   Procedure: UPPER ENDOSCOPIC ULTRASOUND (EUS) LINEAR;  Surgeon: Milus Banister, MD;  Location: [facility] ENDOSCOPY;  Service: Endoscopy;  Laterality: N/A;   knee cartliage repair  1964, 1990   bilateral   PILONIDAL CYST EXCISION  1968   removed   ROTATOR CUFF REPAIR     right   TONSILLECTOMY     UPPER GI ENDOSCOPY  Jan. 2015    Current Outpatient Medications  Medication Sig Dispense Refill   acetaminophen (TYLENOL) 500 MG tablet Take 1,000 mg by mouth as needed for moderate pain or headache.      aspirin EC 81 MG tablet Take 81 mg by mouth every evening.      chlorthalidone (HYGROTON) 25 MG tablet TAKE 1 TABLET BY MOUTH IN  THE MORNING 90 tablet 3   cholecalciferol (VITAMIN D) 25 MCG (1000 UNIT) tablet Take 1,000 Units by mouth daily.     gabapentin (NEURONTIN) 100 MG capsule Takes 1 tablet by mouth as needed     levothyroxine (SYNTHROID) 88 MCG tablet TAKE 1 TABLET BY MOUTH  DAILY BEFORE BREAKFAST 90 tablet 3   lubiprostone (AMITIZA) 24 MCG capsule Take 1 capsule (24 mcg total) by mouth 2 (two) times daily with a meal. 180 capsule 0   metoprolol succinate (TOPROL-XL) 25 MG 24 hr tablet Take 1 tablet (25 mg total) by mouth daily. 90 tablet 1   Multiple Vitamins-Minerals (MULTIVITAMIN WITH MINERALS) tablet Take 1 tablet by mouth daily.     pantoprazole (PROTONIX) 40 MG tablet TAKE 1 TABLET BY MOUTH  DAILY 90 tablet 3   Polyethyl Glycol-Propyl Glycol (SYSTANE ULTRA OP) Place 1 drop into both eyes in the morning.     ramipril (ALTACE) 10 MG capsule TAKE 1 CAPSULE BY MOUTH  TWICE DAILY 180 capsule 3   rivaroxaban (XARELTO) 20 MG TABS tablet TAKE 1 TABLET BY MOUTH   DAILY WITH SUPPER 90 tablet 2   rosuvastatin (CRESTOR) 40 MG tablet TAKE 1 TABLET BY MOUTH IN  THE EVENING 90 tablet 3   Sodium Sulfate-Mag Sulfate-KCl (SUTAB) [phone] MG TABS Use as directed for colonoscopy. MANUFACTURER CODES!! BIN: [id number] PCN: CN GROUP: [id number] MEMBER ID: [id number];VZD AS SECONDARY INSURANCE ;NO PRIOR AUTHORIZATION 24 tablet 0   TART CHERRY PO Take 1 tablet by mouth daily.     vitamin B-12 (CYANOCOBALAMIN) 1000 MCG tablet Take 2,000 mcg by mouth every Monday, Wednesday, and Friday.     No current facility-administered medications for this encounter.  No Known Allergies  Social History   Socioeconomic History   Marital status: Divorced    Spouse name: Not on file   Number of children: 3   Years of education: 54yr   Highest education level: Bachelor's degree (e.g., BA, AB, BS)  Occupational History   Occupation: retired    EF[employer] farm manager RETIRED  Tobacco Use   Smoking status: Former    Packs/day: 2.00    Years: 20.00    Pack years: 40.00    Types: Cigarettes    Quit date: 07/19/1977    Years since quitting: 44.4   Smokeless tobacco: Never   Tobacco comments:    Former smoker 12/12/21  Vaping Use   Vaping Use: Never used  Substance and Sexual Activity   Alcohol use: Yes    Alcohol/week: 10.0 standard drinks    Types: 7 Glasses of wine, 2 Cans of beer, 1 Shots of liquor per week    Comment: dinner wine daily   Drug use: No   Sexual activity: Not Currently  Other Topics Concern   Not on file  Social History Narrative   Pt lives at home alone.    Caffeine Use: very little   Heart healthy diet, is planning exercise      Retired from lEvent organiser  Left handed   Social Determinants of Health   Financial Resource Strain: Low Risk    Difficulty of Paying Living Expenses: Not hard at all  Food Insecurity: No Food Insecurity   Worried About RCharity fundraiserin the Last Year: Never true   RArboriculturistin the Last Year: Never true   Transportation Needs: No Transportation Needs   Lack of Transportation (Medical): No   Lack of Transportation (Non-Medical): No  Physical Activity: Sufficiently Active   Days of Exercise per Week: 4 days   Minutes of Exercise per Session: 40 min  Stress: No Stress Concern Present   Feeling of Stress : Not at all  Social Connections: Moderately Integrated   Frequency of Communication with Friends and Family: More than three times a week   Frequency of Social Gatherings with Friends and Family: More than three times a week   Attends Religious Services: More than 4 times per year   Active Member of [employer] Organ[employer]: Yes   Attends CMus[employer] therapist More than 4 times per year   Marital Status: Divorced  IHuman resources officerViolence: Not At Risk   Fear of Current or Ex-Partner: No   Emotionally Abused: No   Physically Abused: No   Sexually Abused: No     ROS- All systems are reviewed and negative except as per the HPI above.  Physical Exam: Vitals:   12/12/21 0825  BP: 126/62  Pulse: (!) 55  Weight: 99.8 kg  Height: '6\' 2"'$  (1.88 m)     GEN- The patient is a well appearing elderly male, alert and oriented x 3 today.   HEENT-head normocephalic, atraumatic, sclera clear, conjunctiva pink, hearing intact, trachea midline. Lungs- Clear to ausculation bilaterally, normal work of breathing Heart- Regular rate and rhythm, bradycardia, no murmurs, rubs or gallops  GI- soft, NT, ND, + BS Extremities- no clubbing, cyanosis, or edema MS- no significant deformity or atrophy Skin- no rash or lesion Psych- euthymic mood, full affect Neuro- strength and sensation are intact   Wt Readings from Last 3 Encounters:  12/12/21 99.8 kg  12/05/21 96.6 kg  12/05/21 96.7 kg    EKG today demonstrates  SB, 1st degree AV block Vent. rate 55 BPM PR interval 212 ms QRS duration 90 ms QT/QTcB 430/411 ms  Echo 07/18/20 demonstrated   1. Left ventricular ejection fraction, by  estimation, is 60 to 65%. Left  ventricular ejection fraction by 3D volume is 61 %. The left ventricle has normal function. The left ventricle has no regional wall motion  abnormalities. Left ventricular diastolic   parameters are indeterminate. The average left ventricular global  longitudinal strain is -22.9 %. The global longitudinal strain is normal.   2. Right ventricular systolic function is normal. The right ventricular  size is mildly enlarged.   3. The mitral valve is normal in structure. Mild mitral valve  regurgitation. No evidence of mitral stenosis.   4. The aortic valve is tricuspid. There is moderate calcification of the  aortic valve. There is moderate thickening of the aortic valve. Aortic  valve regurgitation is trivial. Very mild aortic valve stenosis. Aortic  valve area, by VTI measures 2.33 cm.   Aortic valve mean gradient measures 7.0 mmHg. Aortic valve Vmax measures 1.76 m/s.   5. Aortic dilatation noted. There is borderline dilatation of the aortic  root, measuring 37 mm.   6. The inferior vena cava is normal in size with greater than 50%  respiratory variability, suggesting right atrial pressure of 3 mmHg.   Epic records are reviewed at length today  CHA2DS2-VASc Score = 6  The patient's score is based upon: CHF History: 0 HTN History: 1 Diabetes History: 0 Stroke History: 2 Vascular Disease History: 1 Age Score: 2 Gender Score: 0       ASSESSMENT AND PLAN: 1. Persistent Atrial Fibrillation (ICD10:  I48.19) The patient's CHA2DS2-VASc score is 6, indicating a 9.7% annual risk of stroke.   S/p DCCV on 12/05/21 Patient appears to be maintaining SR. Decrease Toprol to 12.5 mg daily Continue Xarelto 20 mg daily. Will need to continue this 4 weeks post DCCV with no missed doses. OK to hold for 2 days prior to colonoscopy with Dr Hilarie Fredrickson on 01/12/22. Continue ASA per Dr [name] Cancel for carotid disease.  Will start Lasix 20 mg daily x 3 days  2. Secondary  Hypercoagulable State (ICD10:  D68.69) The patient is at significant risk for stroke/thromboembolism based upon his CHA2DS2-VASc Score of 6.  Continue Rivaroxaban (Xarelto).   3. Obstructive sleep apnea Followed by Dr Ander Slade. Encouraged compliance with oral device.  4. HTN Stable, med changes as above.    Follow up with Dr [name] Cancel per recall. AF clinic in one year.    [facility] [address] [place], [place] [address] [phone] 12/12/2021 8:47 AM

## 2021-12-12 NOTE — Patient Instructions (Signed)
Decrease Metoprolol 12.'5mg'$  daily  Take Lasix '20mg'$  for three days then as needed

## 2021-12-18 NOTE — Telephone Encounter (Signed)
I have spoken to patient to advise that per cardiology, he may hold xarelto 2 days prior to his upcoming procedure on 01/12/22. Patient verbalizes understanding of this information.

## 2021-12-18 NOTE — Telephone Encounter (Signed)
Per excerpt from 12/12/21 atrial fib office note....  "ASSESSMENT AND PLAN: 1. Persistent Atrial Fibrillation (ICD10:  I48.19) The patient's CHA2DS2-VASc score is 6, indicating a 9.7% annual risk of stroke.   S/p DCCV on 12/05/21 Patient appears to be maintaining SR. Decrease Toprol to 12.5 mg daily Continue Xarelto 20 mg daily. Will need to continue this 4 weeks post DCCV with no missed doses. OK to hold for 2 days prior to colonoscopy with Dr Hilarie Fredrickson on 01/12/22.  [facility] [address] [place], [place] [address] [phone] 12/12/2021 8:47 AM"  I have left a message for patient to call back.

## 2022-01-05 ENCOUNTER — Encounter: Payer: Self-pay | Admitting: Internal Medicine

## 2022-01-12 ENCOUNTER — Ambulatory Visit (AMBULATORY_SURGERY_CENTER): Payer: Medicare Other | Admitting: Internal Medicine

## 2022-01-12 ENCOUNTER — Encounter: Payer: Self-pay | Admitting: Internal Medicine

## 2022-01-12 VITALS — BP 108/86 | HR 60 | Temp 97.8°F | Resp 15 | Ht 74.0 in | Wt 212.0 lb

## 2022-01-12 DIAGNOSIS — D124 Benign neoplasm of descending colon: Secondary | ICD-10-CM

## 2022-01-12 DIAGNOSIS — D122 Benign neoplasm of ascending colon: Secondary | ICD-10-CM

## 2022-01-12 DIAGNOSIS — R194 Change in bowel habit: Secondary | ICD-10-CM

## 2022-01-12 DIAGNOSIS — K648 Other hemorrhoids: Secondary | ICD-10-CM

## 2022-01-12 DIAGNOSIS — K625 Hemorrhage of anus and rectum: Secondary | ICD-10-CM

## 2022-01-12 DIAGNOSIS — K5730 Diverticulosis of large intestine without perforation or abscess without bleeding: Secondary | ICD-10-CM

## 2022-01-12 DIAGNOSIS — D123 Benign neoplasm of transverse colon: Secondary | ICD-10-CM

## 2022-01-12 DIAGNOSIS — D120 Benign neoplasm of cecum: Secondary | ICD-10-CM

## 2022-01-12 DIAGNOSIS — R198 Other specified symptoms and signs involving the digestive system and abdomen: Secondary | ICD-10-CM

## 2022-01-12 MED ORDER — LUBIPROSTONE 24 MCG PO CAPS: 24.0000 ug | ORAL_CAPSULE | Freq: Two times a day (BID) | ORAL | 3 refills | Status: DC

## 2022-01-12 MED ORDER — SODIUM CHLORIDE 0.9 % IV SOLN: 500.0000 mL | INTRAVENOUS | Status: DC

## 2022-01-13 ENCOUNTER — Telehealth: Payer: Self-pay

## 2022-01-14 ENCOUNTER — Other Ambulatory Visit (HOSPITAL_BASED_OUTPATIENT_CLINIC_OR_DEPARTMENT_OTHER): Payer: Self-pay

## 2022-01-14 ENCOUNTER — Ambulatory Visit: Payer: Medicare Other | Attending: Internal Medicine

## 2022-01-14 MED ORDER — MODERNA COVID-19 BIVALENT 50 MCG/0.5ML IM SUSP
INTRAMUSCULAR | 0 refills | Status: DC
  Filled 2022-01-14: qty 0.5, 1d supply, fill #0

## 2022-01-14 NOTE — Progress Notes (Signed)
   Covid-19 Vaccination Clinic  Name:  MATHEU PLOEGER    MRN: [id number] DOB: 31-May-1935  01/14/2022  Mr. Nerio was observed post Covid-19 immunization for 15 minutes without incident. He was provided with Vaccine Information Sheet and instruction to access the V-Safe system.   Mr. Eckenrode was instructed to call 911 with any severe reactions post vaccine: Difficulty breathing  Swelling of face and throat  A fast heartbeat  A bad rash all over body  Dizziness and weakness

## 2022-01-15 ENCOUNTER — Encounter: Payer: Self-pay | Admitting: Internal Medicine

## 2022-01-27 ENCOUNTER — Encounter: Payer: Self-pay | Admitting: Family Medicine

## 2022-01-28 NOTE — Telephone Encounter (Signed)
Sent message to Lesly Rubenstein to get approval.

## 2022-01-30 NOTE — Progress Notes (Unsigned)
Tanner [name] Phone: [phone] Subjective:    I'm seeing this patient by the request  of:  [name], MD  CC:   [id number]  Tanner [name] is a 87 y.o. male coming in with complaint of B knee pain. Last seen in January for R knee pain. Here today to discuss L leg pain as well as received visco for R knee. Patient states        Past Medical History:  Diagnosis Date   Allergy    seasonal- mostly spring   Anemia 02/03/2011   ASTHMA, CHILDHOOD 03/17/2010   mostly as child   BCC (basal cell carcinoma) 12/08/2011   CAROTID ARTERY OCCLUSION, WITH INFARCTION 03/17/2010   CEREBROVASCULAR ACCIDENT 03/17/2010   partial loss of peripheral vision on left-"slight improved"   CHICKENPOX, HX OF 03/17/2010   Constipation 12/08/2014   Epistaxis 09/06/2013   FATIGUE 03/17/2010   Hearing loss 11/04/2010   bilateral   HYPERKALEMIA 05/27/2010   Hyperlipidemia    Hypertension    Hypothyroid 12/08/2011   Mixed hyperlipidemia 03/17/2010   Oral lesion 11/20/2014   Overweight(278.02) 07/01/2010   Peripheral neuropathy 08/18/2016   Personal history of other infectious and parasitic disease 03/17/2010   Preventative health care 08/23/2016   Skin lesion of right arm 11/20/2014   Sleep apnea    uses mouthpiece only   Thrombocytopenia ([place]) 04/05/2012   TOBACCO ABUSE, HX OF 03/17/2010   Tubular adenoma of colon 06/10/2011   Past Surgical History:  Procedure Laterality Date   CARDIOVERSION N/A 08/11/2019   Procedure: CARDIOVERSION;  Surgeon: Josue Hector, MD;  Location: [facility] ENDOSCOPY;  Service: Cardiovascular;  Laterality: N/A;   CARDIOVERSION N/A 12/05/2021   Procedure: CARDIOVERSION;  Surgeon: Josue Hector, MD;  Location: [facility] ENDOSCOPY;  Service: Cardiovascular;  Laterality: N/A;   CAROTID ENDARTERECTOMY Left February 13, 2013   cea   CATARACT EXTRACTION, BILATERAL Right    Cataract, and stigmatism   COLONOSCOPY  Dec. 2014    ENDARTERECTOMY Left 02/13/2013   Procedure: ENDARTERECTOMY CAROTID;  Surgeon: Rosetta Posner, MD;  Location: [facility];  Service: Vascular;  Laterality: Left;   EUS N/A 08/17/2013   Procedure: UPPER ENDOSCOPIC ULTRASOUND (EUS) LINEAR;  Surgeon: Milus Banister, MD;  Location: [facility] ENDOSCOPY;  Service: Endoscopy;  Laterality: N/A;   knee cartliage repair  1964, 1990   bilateral   PILONIDAL CYST EXCISION  1968   removed   ROTATOR CUFF REPAIR     right   TONSILLECTOMY     UPPER GI ENDOSCOPY  Jan. 2015   Social History   Socioeconomic History   Marital status: Divorced    Spouse name: Not on file   Number of children: 3   Years of education: 64yr   Highest education level: Bachelor's degree (e.g., BA, AB, BS)  Occupational History   Occupation: retired    [employer] farm manager RETIRED  Tobacco Use   Smoking status: Former    Packs/day: 2.00    Years: 20.00    Total pack years: 40.00    Types: Cigarettes    Quit date: 07/19/1977    Years since quitting: 44.5   Smokeless tobacco: Never   Tobacco comments:    Former smoker 12/12/21  Vaping Use   Vaping Use: Never used  Substance and Sexual Activity   Alcohol use: Not Currently    Alcohol/week: 10.0 standard drinks of alcohol    Types: 7 Glasses of wine,  2 Cans of beer, 1 Shots of liquor per week   Drug use: No   Sexual activity: Not Currently  Other Topics Concern   Not on file  Social History Narrative   Pt lives at home alone.    Caffeine Use: very little   Heart healthy diet, is planning exercise      Retired from Event organiser   Left handed   Social Determinants of [place] Strain: [place]  (09/01/2021)   Overall Financial Resource Strain (CARDIA)    Difficulty of Paying Living Expenses: Not hard at all  Food Insecurity: No Food Insecurity (09/01/2021)   Hunger Vital Sign    Worried About Running Out of Food in the Last Year: Never true    [place] in the Last Year: Never true  Transportation Needs: No  Transportation Needs (09/01/2021)   PRAPARE - Hydrologist (Medical): No    Lack of Transportation (Non-Medical): No  Physical Activity: Sufficiently Active (09/01/2021)   Exercise Vital Sign    Days of Exercise per Week: 4 days    Minutes of Exercise per Session: 40 min  Stress: No Stress Concern Present (09/01/2021)   [place]    Feeling of Stress : Not at all  Social Connections: Moderately Integrated (09/01/2021)   Social Connection and Isolation Panel [NHANES]    Frequency of Communication with Friends and Family: More than three times a week    Frequency of Social Gatherings with Friends and Family: More than three times a week    Attends Religious Services: More than 4 times per year    Active Member of [employer] or Organizations: Yes    Attends Music therapist: More than 4 times per year    Marital Status: Divorced   No Known Allergies Family History  Problem Relation Age of Onset   Alzheimer's disease Mother    Emphysema Sister        cigarettes   Cancer Sister        lung, tobacco    Heart disease Son    Coronary artery disease Brother    Heart attack Brother    Other Brother        heart problems- from fathers side   Cancer Brother        lung? smoker   Heart disease Brother        Heart disease before age 59   Cancer Sister        gyn   Heart disease Sister    Bipolar disorder Daughter    Obesity Daughter    Stroke Father    Heart disease Sister    Esophageal cancer Neg Hx    Colon cancer Neg Hx    Pancreatic cancer Neg Hx    Stomach cancer Neg Hx     Current Outpatient Medications (Endocrine & Metabolic):    levothyroxine (SYNTHROID) 88 MCG tablet, TAKE 1 TABLET BY MOUTH  DAILY BEFORE BREAKFAST  Current Outpatient Medications (Cardiovascular):    chlorthalidone (HYGROTON) 25 MG tablet, TAKE 1 TABLET BY MOUTH IN  THE MORNING   furosemide (LASIX) 20  MG tablet, Take 1 Tablet for Three Days then PRN   metoprolol succinate (TOPROL-XL) 25 MG 24 hr tablet, Take 0.5 tablets (12.5 mg total) by mouth daily.   ramipril (ALTACE) 10 MG capsule, TAKE 1 CAPSULE BY MOUTH  TWICE DAILY   rosuvastatin (CRESTOR)  40 MG tablet, TAKE 1 TABLET BY MOUTH IN  THE EVENING   Current Outpatient Medications (Analgesics):    acetaminophen (TYLENOL) 500 MG tablet, Take 1,000 mg by mouth as needed for moderate pain or headache.    aspirin EC 81 MG tablet, Take 81 mg by mouth every evening.   Current Outpatient Medications (Hematological):    rivaroxaban (XARELTO) 20 MG TABS tablet, TAKE 1 TABLET BY MOUTH  DAILY WITH SUPPER   vitamin B-12 (CYANOCOBALAMIN) 1000 MCG tablet, Take 2,000 mcg by mouth every Monday, Wednesday, and Friday.  Current Outpatient Medications (Other):    cholecalciferol (VITAMIN D) 25 MCG (1000 UNIT) tablet, Take 1,000 Units by mouth daily.   COVID-19 mRNA bivalent vaccine, Moderna, (MODERNA COVID-19 BIVALENT) 50 MCG/0.5ML injection, Inject into the muscle.   gabapentin (NEURONTIN) 100 MG capsule, Takes 1 tablet by mouth as needed (Patient not taking: Reported on 01/12/2022)   lubiprostone (AMITIZA) 24 MCG capsule, Take 1 capsule (24 mcg total) by mouth 2 (two) times daily with a meal.   Multiple Vitamins-Minerals (MULTIVITAMIN WITH MINERALS) tablet, Take 1 tablet by mouth daily.   pantoprazole (PROTONIX) 40 MG tablet, TAKE 1 TABLET BY MOUTH  DAILY   Polyethyl Glycol-Propyl Glycol (SYSTANE ULTRA OP), Place 1 drop into both eyes in the morning.   TART CHERRY PO, Take 1 tablet by mouth daily.   Reviewed prior external information including notes and imaging from  primary care provider As well as notes that were available from care everywhere and other healthcare systems.  Past medical history, social, surgical and family history all reviewed in electronic medical record.  No pertanent information unless stated regarding to the chief complaint.    Review of Systems:  No headache, visual changes, nausea, vomiting, diarrhea, constipation, dizziness, abdominal pain, skin rash, fevers, chills, night sweats, weight loss, swollen lymph nodes, body aches, joint swelling, chest pain, shortness of breath, mood changes. POSITIVE muscle aches  Objective  There were no vitals taken for this visit.   General: No apparent distress alert and oriented x3 mood and affect normal, dressed appropriately.  HEENT: Pupils equal, extraocular movements intact  Respiratory: Patient's speak in full sentences and does not appear short of breath  Cardiovascular: No lower extremity edema, non tender, no erythema      Impression and Recommendations:

## 2022-02-02 ENCOUNTER — Ambulatory Visit: Payer: Self-pay

## 2022-02-02 ENCOUNTER — Ambulatory Visit: Payer: Medicare Other | Admitting: Family Medicine

## 2022-02-02 VITALS — BP 124/96 | HR 66 | Ht 74.0 in | Wt 212.0 lb

## 2022-02-02 DIAGNOSIS — M1711 Unilateral primary osteoarthritis, right knee: Secondary | ICD-10-CM | POA: Diagnosis not present

## 2022-02-02 DIAGNOSIS — M7062 Trochanteric bursitis, left hip: Secondary | ICD-10-CM | POA: Diagnosis not present

## 2022-02-02 DIAGNOSIS — G8929 Other chronic pain: Secondary | ICD-10-CM

## 2022-02-02 DIAGNOSIS — M25561 Pain in right knee: Secondary | ICD-10-CM

## 2022-02-02 DIAGNOSIS — M25562 Pain in left knee: Secondary | ICD-10-CM

## 2022-02-02 MED ORDER — SODIUM HYALURONATE 60 MG/3ML IX PRSY
60.0000 mg | PREFILLED_SYRINGE | Freq: Once | INTRA_ARTICULAR | Status: AC
  Administered 2022-02-02: 60 mg via INTRA_ARTICULAR

## 2022-02-02 NOTE — Patient Instructions (Signed)
Great to see you as always  Ice 20 minutes 2 times daily. Usually after activity and before bed. Exercises 3 times a week.  Watch the leg and if no improvement we will need to look at the leg as something form the back  See me again in 6-8 weeks

## 2022-02-02 NOTE — Assessment & Plan Note (Signed)
Chronic problem with worsening symptoms again.  Discussed with patient about posture and ergonomics, discussed with patient about bracing when necessary.  Increase activity slowly otherwise.  Follow-up with me again in 6 to 8 weeks.

## 2022-02-02 NOTE — Assessment & Plan Note (Signed)
Chronic problem with exacerbation.  I am concerned that there is a possibility of lumbar radiculopathy and we will need to monitor. Patient has had difficulty with maybe even some mild weakness in the foot.  Patient responded relatively well to the injection.  We will see how patient responds otherwise.  Follow-up again in 7 to 8 weeks.

## 2022-02-03 NOTE — Progress Notes (Signed)
[place] Phone: [phone] Subjective:      I'm seeing this patient by the request  of:  [name], MD  CC: Left hip pain right knee pain  [id number]  Tanner [name] is a 87 y.o. male coming in with complaint of B knee pain. Last seen in January for R knee pain. Here today to discuss L leg pain as well as received visco for R knee. Patient states that for one year he had to move comb and wallet out of back pocket as they were causing hip pain. Also noted pain when he took first few steps after being seated. Pain can radiate down lateral aspect of leg to the calf and wraps around the front of L ankle. Pain in calf can at times be worse than L hip. Has intermittent back pain over L side.   Patient feels like R knee is weak and unstable descending stairs. Is hoping for gel injection today.        Past Medical History:  Diagnosis Date   Allergy    seasonal- mostly spring   Anemia 02/03/2011   ASTHMA, CHILDHOOD 03/17/2010   mostly as child   BCC (basal cell carcinoma) 12/08/2011   CAROTID ARTERY OCCLUSION, WITH INFARCTION 03/17/2010   CEREBROVASCULAR ACCIDENT 03/17/2010   partial loss of peripheral vision on left-"slight improved"   CHICKENPOX, HX OF 03/17/2010   Constipation 12/08/2014   Epistaxis 09/06/2013   FATIGUE 03/17/2010   Hearing loss 11/04/2010   bilateral   HYPERKALEMIA 05/27/2010   Hyperlipidemia    Hypertension    Hypothyroid 12/08/2011   Mixed hyperlipidemia 03/17/2010   Oral lesion 11/20/2014   Overweight(278.02) 07/01/2010   Peripheral neuropathy 08/18/2016   Personal history of other infectious and parasitic disease 03/17/2010   Preventative health care 08/23/2016   Skin lesion of right arm 11/20/2014   Sleep apnea    uses mouthpiece only   Thrombocytopenia ([place]) 04/05/2012   TOBACCO ABUSE, HX OF 03/17/2010   Tubular adenoma of colon 06/10/2011   Past Surgical History:  Procedure  Laterality Date   CARDIOVERSION N/A 08/11/2019   Procedure: CARDIOVERSION;  Surgeon: Josue Hector, MD;  Location: [facility] ENDOSCOPY;  Service: Cardiovascular;  Laterality: N/A;   CARDIOVERSION N/A 12/05/2021   Procedure: CARDIOVERSION;  Surgeon: Josue Hector, MD;  Location: [place];  Service: Cardiovascular;  Laterality: N/A;   CAROTID ENDARTERECTOMY Left February 13, 2013   cea   CATARACT EXTRACTION, BILATERAL Right    Cataract, and stigmatism   COLONOSCOPY  Dec. 2014   ENDARTERECTOMY Left 02/13/2013   Procedure: ENDARTERECTOMY CAROTID;  Surgeon: Rosetta Posner, MD;  Location: [facility];  Service: Vascular;  Laterality: Left;   EUS N/A 08/17/2013   Procedure: UPPER ENDOSCOPIC ULTRASOUND (EUS) LINEAR;  Surgeon: Milus Banister, MD;  Location: [facility] ENDOSCOPY;  Service: Endoscopy;  Laterality: N/A;   knee cartliage repair  1964, 1990   bilateral   PILONIDAL CYST EXCISION  1968   removed   ROTATOR CUFF REPAIR     right   TONSILLECTOMY     UPPER GI ENDOSCOPY  Jan. 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  Tobacco Use   Smoking status: Former    Packs/day: 2.00    Years: 20.00    Total pack years: 40.00    Types: Cigarettes    Quit date: 07/19/1977    Years since quitting: 44.5   Smokeless tobacco: Never   Tobacco comments:    Former smoker 12/12/21  Vaping Use   Vaping Use: Never used  Substance and Sexual Activity   Alcohol use: Not Currently    Alcohol/week: 10.0 standard drinks of alcohol    Types: 7 Glasses of wine, 2 Cans of beer, 1 Shots of liquor per week   Drug use: No   Sexual activity: Not Currently  Other Topics Concern   Not on file  Social History Narrative   Pt lives at home alone.    Caffeine Use: very little   Heart  healthy diet, is planning exercise      Retired from Event organiser   Left handed   Social Determinants of [place] Strain: [place]  (09/01/2021)   Overall Financial Resource Strain (CARDIA)    Difficulty of Paying Living Expenses: Not hard at all  Food Insecurity: No Food Insecurity (09/01/2021)   Hunger Vital Sign    Worried About Running Out of Food in the Last Year: Never true    [place] in the Last Year: Never true  Transportation Needs: No Transportation Needs (09/01/2021)   PRAPARE - Hydrologist (Medical): No    Lack of Transportation (Non-Medical): No  Physical Activity: Sufficiently Active (09/01/2021)   Exercise Vital Sign    Days of Exercise per Week: 4 days    Minutes of Exercise per Session: 40 min  Stress: No Stress Concern Present (09/01/2021)   [place]    Feeling of Stress : Not at all  Social Connections: Moderately Integrated (09/01/2021)   Social Connection and Isolation Panel [NHANES]    Frequency of Communication with Friends and Family: More than three times a week    Frequency of Social Gatherings with Friends and Family: More than three times a week    Attends Religious Services: More than 4 times per year    Active Member of [employer] or Organizations: Yes    Attends Music therapist: More than 4 times per year    Marital Status: Divorced   No Known Allergies Family History  Problem Relation Age of Onset   Alzheimer's disease Mother    Emphysema Sister        cigarettes   Cancer Sister        lung, tobacco    Heart disease Son    Coronary artery disease Brother    Heart attack Brother    Other Brother        heart problems- from fathers side   Cancer Brother        lung? smoker   Heart disease Brother        Heart disease before age 3   Cancer Sister        gyn   Heart disease Sister    Bipolar disorder  Daughter    Obesity Daughter    Stroke Father    Heart disease Sister    Esophageal cancer Neg Hx    Colon cancer Neg Hx    Pancreatic cancer Neg Hx    Stomach cancer Neg Hx     Current Outpatient  Medications (Endocrine & Metabolic):    levothyroxine (SYNTHROID) 88 MCG tablet, TAKE 1 TABLET BY MOUTH  DAILY BEFORE BREAKFAST  Current Outpatient Medications (Cardiovascular):    chlorthalidone (HYGROTON) 25 MG tablet, TAKE 1 TABLET BY MOUTH IN  THE MORNING   furosemide (LASIX) 20 MG tablet, Take 1 Tablet for Three Days then PRN   metoprolol succinate (TOPROL-XL) 25 MG 24 hr tablet, Take 0.5 tablets (12.5 mg total) by mouth daily.   ramipril (ALTACE) 10 MG capsule, TAKE 1 CAPSULE BY MOUTH  TWICE DAILY   rosuvastatin (CRESTOR) 40 MG tablet, TAKE 1 TABLET BY MOUTH IN  THE EVENING   Current Outpatient Medications (Analgesics):    acetaminophen (TYLENOL) 500 MG tablet, Take 1,000 mg by mouth as needed for moderate pain or headache.    aspirin EC 81 MG tablet, Take 81 mg by mouth every evening.   Current Outpatient Medications (Hematological):    rivaroxaban (XARELTO) 20 MG TABS tablet, TAKE 1 TABLET BY MOUTH  DAILY WITH SUPPER   vitamin B-12 (CYANOCOBALAMIN) 1000 MCG tablet, Take 2,000 mcg by mouth every Monday, Wednesday, and Friday.  Current Outpatient Medications (Other):    cholecalciferol (VITAMIN D) 25 MCG (1000 UNIT) tablet, Take 1,000 Units by mouth daily.   COVID-19 mRNA bivalent vaccine, Moderna, (MODERNA COVID-19 BIVALENT) 50 MCG/0.5ML injection, Inject into the muscle.   gabapentin (NEURONTIN) 100 MG capsule, Takes 1 tablet by mouth as needed   lubiprostone (AMITIZA) 24 MCG capsule, Take 1 capsule (24 mcg total) by mouth 2 (two) times daily with a meal.   Multiple Vitamins-Minerals (MULTIVITAMIN WITH MINERALS) tablet, Take 1 tablet by mouth daily.   pantoprazole (PROTONIX) 40 MG tablet, TAKE 1 TABLET BY MOUTH  DAILY   Polyethyl Glycol-Propyl Glycol (SYSTANE ULTRA OP), Place 1  drop into both eyes in the morning.   TART CHERRY PO, Take 1 tablet by mouth daily.   Reviewed prior external information including notes and imaging from  primary care provider As well as notes that were available from care everywhere and other healthcare systems.  Past medical history, social, surgical and family history all reviewed in electronic medical record.  No pertanent information unless stated regarding to the chief complaint.   Review of Systems:  No headache, visual changes, nausea, vomiting, diarrhea, constipation, dizziness, abdominal pain, skin rash, fevers, chills, night sweats, weight loss, swollen lymph nodes, body aches, joint swelling, chest pain, shortness of breath, mood changes. POSITIVE muscle aches  Objective  Blood pressure (!) 124/96, pulse 66, height '6\' 2"'$  (1.88 m), weight 212 lb (96.2 kg), SpO2 96 %.   General: No apparent distress alert and oriented x3 mood and affect normal, dressed appropriately.  HEENT: Pupils equal, extraocular movements intact  Respiratory: Patient's speak in full sentences and does not appear short of breath  Cardiovascular: No lower extremity edema, non tender, no erythema  Antalgic gait noted.  Patient's right knee does have instability noted with valgus and varus force.  Patient does have fusion noted of the right knee.  Crepitus noted with range of motion.  Left hip exam does have tenderness to palpation mostly over the greater trochanteric area.  Patient does have a mild positive [place] Sox.  Negative straight leg test noted.  After informed written and verbal consent, patient was seated on exam table. Right knee was prepped with alcohol swab and utilizing anterolateral approach, patient's right knee space was injected with '60mg'$  per 3 mL of Durolane (sodium hyaluronate) in a prefilled syringe was injected easily into the  knee through a 22-gauge needle..Patient tolerated the procedure well without immediate complications.    Procedure:  Real-time Ultrasound Guided Injection of left  greater trochanteric bursitis secondary to patient's body habitus Device: GE Logiq Q7  Ultrasound guided injection is preferred based studies that show increased duration, increased effect, greater accuracy, decreased procedural pain, increased response rate, and decreased cost with ultrasound guided versus blind injection.  Verbal informed consent obtained.  Time-out conducted.  Noted no overlying erythema, induration, or other signs of local infection.  Skin prepped in a sterile fashion.  Local anesthesia: Topical Ethyl chloride.  With sterile technique and under real time ultrasound guidance:  Greater trochanteric area was visualized and patient's bursa was noted. A 22-gauge 3 inch needle was inserted and 4 cc of 0.5% Marcaine and 1 cc of Kenalog 40 mg/dL was injected. Pictures taken Completed without difficulty  Pain immediately resolved suggesting accurate placement of the medication.  Advised to call if fevers/chills, erythema, induration, drainage, or persistent bleeding.  Images permanently stored and available for review in the ultrasound unit.  Impression: Technically successful ultrasound guided injection.   Impression and Recommendations:    The above documentation has been reviewed and is accurate and complete Lyndal Pulley, DO

## 2022-02-16 ENCOUNTER — Ambulatory Visit: Payer: Medicare Other | Admitting: Family Medicine

## 2022-03-16 NOTE — Progress Notes (Unsigned)
Tanner [name] Phone: [phone] Subjective:   Tanner [name], am serving as a scribe for Dr. Hulan Saas.  I'm seeing this patient by the request  of:  [name], MD  CC: Left hip, and bilateral knee pain  [id number]  02/02/2022 Chronic problem with exacerbation.  I am concerned that there is a possibility of lumbar radiculopathy and we will need to monitor. Patient has had difficulty with maybe even some mild weakness in the foot.  Patient responded relatively well to the injection.  We will see how patient responds otherwise.  Follow-up again in 7 to 8 weeks.  Chronic problem with worsening symptoms again.  Discussed with patient about posture and ergonomics, discussed with patient about bracing when necessary.  Increase activity slowly otherwise.  Follow-up with me again in 6 to 8 weeks.  Update 03/17/2022 Tanner [name] is a 87 y.o. male coming in with complaint of R knee and L hip pain. Patient states that the first 3 weeks after the hip injection his pain had improved. Pain is now back to a level at which it was before last visit.   Patient states that when he walks he develops pain in distal, anterior tibia. History of fracture in this area. Patient has custom orthotics that increase his pain in the foot and in distal tibia. Painful in leg to walk down the stairs and anytime he places all of his weight on that leg.   R knee pain persists. Does not feel like last gel injection was as helpful as previous injections.      Past Medical History:  Diagnosis Date   Allergy    seasonal- mostly spring   Anemia 02/03/2011   ASTHMA, CHILDHOOD 03/17/2010   mostly as child   BCC (basal cell carcinoma) 12/08/2011   CAROTID ARTERY OCCLUSION, WITH INFARCTION 03/17/2010   CEREBROVASCULAR ACCIDENT 03/17/2010   partial loss of peripheral vision on left-"slight improved"   CHICKENPOX, HX OF 03/17/2010    Constipation 12/08/2014   Epistaxis 09/06/2013   FATIGUE 03/17/2010   Hearing loss 11/04/2010   bilateral   HYPERKALEMIA 05/27/2010   Hyperlipidemia    Hypertension    Hypothyroid 12/08/2011   Mixed hyperlipidemia 03/17/2010   Oral lesion 11/20/2014   Overweight(278.02) 07/01/2010   Peripheral neuropathy 08/18/2016   Personal history of other infectious and parasitic disease 03/17/2010   Preventative health care 08/23/2016   Skin lesion of right arm 11/20/2014   Sleep apnea    uses mouthpiece only   Thrombocytopenia ([place]) 04/05/2012   TOBACCO ABUSE, HX OF 03/17/2010   Tubular adenoma of colon 06/10/2011   Past Surgical History:  Procedure Laterality Date   CARDIOVERSION N/A 08/11/2019   Procedure: CARDIOVERSION;  Surgeon: Josue Hector, MD;  Location: [facility] ENDOSCOPY;  Service: Cardiovascular;  Laterality: N/A;   CARDIOVERSION N/A 12/05/2021   Procedure: CARDIOVERSION;  Surgeon: Josue Hector, MD;  Location: [place];  Service: Cardiovascular;  Laterality: N/A;   CAROTID ENDARTERECTOMY Left February 13, 2013   cea   CATARACT EXTRACTION, BILATERAL Right    Cataract, and stigmatism   COLONOSCOPY  Dec. 2014   ENDARTERECTOMY Left 02/13/2013   Procedure: ENDARTERECTOMY CAROTID;  Surgeon: Rosetta Posner, MD;  Location: [place];  Service: Vascular;  Laterality: Left;   EUS N/A 08/17/2013   Procedure: UPPER ENDOSCOPIC ULTRASOUND (EUS) LINEAR;  Surgeon: Milus Banister, MD;  Location: [facility] ENDOSCOPY;  Service: Endoscopy;  Laterality:  N/A;   knee cartliage repair  1964, 1990   bilateral   PILONIDAL CYST EXCISION  1968   removed   ROTATOR CUFF REPAIR     right   TONSILLECTOMY     UPPER GI ENDOSCOPY  Jan. 2015   Social History   Socioeconomic History   Marital status: Divorced    Spouse name: Not on file   Number of children: 3   Years of education: 69yr   Highest education level: Bachelor's degree (e.g., BA, AB, BS)  Occupational History   Occupation: retired    EFish farm manager RETIRED  Tobacco Use    Smoking status: Former    Packs/day: 2.00    Years: 20.00    Total pack years: 40.00    Types: Cigarettes    Quit date: 07/19/1977    Years since quitting: 44.6   Smokeless tobacco: Never   Tobacco comments:    Former smoker 12/12/21  Vaping Use   Vaping Use: Never used  Substance and Sexual Activity   Alcohol use: Not Currently    Alcohol/week: 10.0 standard drinks of alcohol    Types: 7 Glasses of wine, 2 Cans of beer, 1 Shots of liquor per week   Drug use: [name]   Sexual activity: Not Currently  Other Topics Concern   Not on file  Social History Narrative   Pt lives at home alone.    Caffeine Use: very little   Heart healthy diet, is planning exercise      Retired from lEvent organiser  Left handed   Social Determinants of HHillburnStrain: LGregory (09/01/2021)   Overall Financial Resource Strain (CARDIA)    Difficulty of Paying Living Expenses: Not hard at all  Food Insecurity: [name] Food Insecurity (09/01/2021)   Hunger Vital Sign    Worried About Running Out of Food in the Last Year: Never true    RSpring [place] the Last Year: Never true  Transportation Needs: [name] Transportation Needs (09/01/2021)   PRAPARE - THydrologist(Medical): [name]    Lack of Transportation (Non-Medical): [name]  Physical Activity: Sufficiently Active (09/01/2021)   Exercise Vital Sign    Days of Exercise per Week: 4 days    Minutes of Exercise per Session: 40 min  Stress: [name] Stress Concern Present (09/01/2021)   FMelrose   Feeling of Stress : Not at all  Social Connections: Moderately Integrated (09/01/2021)   Social Connection and Isolation Panel [NHANES]    Frequency of Communication with Friends and Family: More than three times a week    Frequency of Social Gatherings with Friends and Family: More than three times a week    Attends Religious Services: More than 4 times per year     Active Member of [employer] Organizations: Yes    Attends CMusic therapist More than 4 times per year    Marital Status: Divorced   [name] Family History  Problem Relation Age of Onset   Alzheimer's disease Mother    Emphysema Sister        cigarettes   Cancer Sister        lung, tobacco    Heart disease Son    Coronary artery disease Brother    Heart attack Brother    Other Brother        heart problems- from fathers side   Cancer  Brother        lung? smoker   Heart disease Brother        Heart disease before age 52   Cancer Sister        gyn   Heart disease Sister    Bipolar disorder Daughter    Obesity Daughter    Stroke Father    Heart disease Sister    Esophageal cancer Neg Hx    Colon cancer Neg Hx    Pancreatic cancer Neg Hx    Stomach cancer Neg Hx     Current Outpatient Medications (Endocrine & Metabolic):    levothyroxine (SYNTHROID) 88 MCG tablet, TAKE 1 TABLET BY MOUTH  DAILY BEFORE BREAKFAST  Current Outpatient Medications (Cardiovascular):    chlorthalidone (HYGROTON) 25 MG tablet, TAKE 1 TABLET BY MOUTH IN  THE MORNING   furosemide (LASIX) 20 MG tablet, Take 1 Tablet for Three Days then PRN   metoprolol succinate (TOPROL-XL) 25 MG 24 hr tablet, Take 0.5 tablets (12.5 mg total) by mouth daily.   ramipril (ALTACE) 10 MG capsule, TAKE 1 CAPSULE BY MOUTH  TWICE DAILY   rosuvastatin (CRESTOR) 40 MG tablet, TAKE 1 TABLET BY MOUTH IN  THE EVENING   Current Outpatient Medications (Analgesics):    acetaminophen (TYLENOL) 500 MG tablet, Take 1,000 mg by mouth as needed for moderate pain or headache.    aspirin EC 81 MG tablet, Take 81 mg by mouth every evening.   Current Outpatient Medications (Hematological):    rivaroxaban (XARELTO) 20 MG TABS tablet, TAKE 1 TABLET BY MOUTH  DAILY WITH SUPPER   vitamin B-12 (CYANOCOBALAMIN) 1000 MCG tablet, Take 2,000 mcg by mouth every Monday, Wednesday, and Friday.  Current Outpatient Medications  (Other):    cholecalciferol (VITAMIN D) 25 MCG (1000 UNIT) tablet, Take 1,000 Units by mouth daily.   COVID-19 mRNA bivalent vaccine, Moderna, (MODERNA COVID-19 BIVALENT) 50 MCG/0.5ML injection, Inject into the muscle.   gabapentin (NEURONTIN) 100 MG capsule, Takes 1 tablet by mouth as needed   lubiprostone (AMITIZA) 24 MCG capsule, Take 1 capsule (24 mcg total) by mouth 2 (two) times daily with a meal.   Multiple Vitamins-Minerals (MULTIVITAMIN WITH MINERALS) tablet, Take 1 tablet by mouth daily.   pantoprazole (PROTONIX) 40 MG tablet, TAKE 1 TABLET BY MOUTH  DAILY   Polyethyl Glycol-Propyl Glycol (SYSTANE ULTRA OP), Place 1 drop into both eyes in the morning.   TART CHERRY PO, Take 1 tablet by mouth daily.   Reviewed prior external information including notes and imaging from  primary care provider As well as notes that were available from care everywhere and other healthcare systems.  Past medical history, social, surgical and family history all reviewed in electronic medical record.  [name] pertanent information unless stated regarding to the chief complaint.   Review of Systems:  [name] headache, visual changes, nausea, vomiting, diarrhea, constipation, dizziness, abdominal pain, skin rash, fevers, chills, night sweats, weight loss, swollen lymph nodes, body aches, joint swelling, chest pain, shortness of breath, mood changes. POSITIVE muscle aches  Objective  Blood pressure 126/74, pulse 69, height '6\' 2"'$  (1.88 m), weight 219 lb (99.3 kg), SpO2 94 %.   General: [name] apparent distress alert and oriented x3 mood and affect normal, dressed appropriately.  HEENT: Pupils equal, extraocular movements intact  Respiratory: Patient's speak in full sentences and does not appear short of breath  Cardiovascular: [name] lower extremity edema, non tender, [name] erythema  Mild antalgic gait noted.  Patient does have severe arthritic changes  of the right knee noted.  Instability noted with valgus and varus force.   Left knee also has a trace effusion noted.  Crepitus noted with lateral tracking of the patella. Left hip exam does have some decrease in internal and external range of motion.  Still tender to palpation on the lateral aspect.  [id number]; 15 additional minutes spent for Therapeutic exercises as stated in above notes.  This included exercises focusing on stretching, strengthening, with significant focus on eccentric aspects.   Long term goals include an improvement in range of motion, strength, endurance as well as avoiding reinjury. Patient's frequency would include in 1-2 times a day, 3-5 times a week for a duration of 6-12 weeks. Hip strengthening exercises which included:  Pelvic tilt/bracing to help with proper recruitment of the lower abs and pelvic floor muscles  Glute strengthening to properly contract glutes without over-engaging low back and hamstrings - prone hip extension and glute bridge exercises Proper stretching techniques to increase effectiveness for the hip flexors, groin, quads, piriformic and low back when appropriate   Proper technique shown and discussed handout in great detail with ATC.  All questions were discussed and answered.      Impression and Recommendations:    The above documentation has been reviewed and is accurate and complete Lyndal Pulley, DO

## 2022-03-17 ENCOUNTER — Ambulatory Visit (INDEPENDENT_AMBULATORY_CARE_PROVIDER_SITE_OTHER): Payer: Medicare Other

## 2022-03-17 ENCOUNTER — Ambulatory Visit: Payer: Medicare Other | Admitting: Family Medicine

## 2022-03-17 ENCOUNTER — Encounter: Payer: Self-pay | Admitting: Family Medicine

## 2022-03-17 VITALS — BP 126/74 | HR 69 | Ht 74.0 in | Wt 219.0 lb

## 2022-03-17 DIAGNOSIS — M25562 Pain in left knee: Secondary | ICD-10-CM | POA: Diagnosis not present

## 2022-03-17 DIAGNOSIS — M1711 Unilateral primary osteoarthritis, right knee: Secondary | ICD-10-CM | POA: Diagnosis not present

## 2022-03-17 DIAGNOSIS — G8929 Other chronic pain: Secondary | ICD-10-CM | POA: Diagnosis not present

## 2022-03-17 DIAGNOSIS — M25552 Pain in left hip: Secondary | ICD-10-CM

## 2022-03-17 DIAGNOSIS — M7062 Trochanteric bursitis, left hip: Secondary | ICD-10-CM | POA: Diagnosis not present

## 2022-03-17 NOTE — Patient Instructions (Addendum)
Shoe shopping Exercises  Xray L knee and L hip See me in 6 weeks

## 2022-03-17 NOTE — Assessment & Plan Note (Signed)
Still has some discomfort in this area.  We will get x-rays to further evaluate how much is there is some arthritic changes.  Concerned that lumbar radiculopathy was in the differential as well.  Follow-up again in 2 to 3 months after home exercises focusing on hip abductor strengthening.

## 2022-03-17 NOTE — Assessment & Plan Note (Signed)
Likely arthritic changes of the contralateral knee as well.  Likely contributing to some of the discomfort and pain as well.  We discussed with patient that we could consider another injection.  Patient wants to start with home exercises first.  See how patient responds.  Then if worsening consider formal physical therapy.  Follow-up again in 2 to 3 months

## 2022-03-24 ENCOUNTER — Other Ambulatory Visit: Payer: Self-pay | Admitting: Cardiovascular Disease

## 2022-03-24 ENCOUNTER — Other Ambulatory Visit: Payer: Self-pay | Admitting: Family Medicine

## 2022-03-24 DIAGNOSIS — I4819 Other persistent atrial fibrillation: Secondary | ICD-10-CM

## 2022-03-24 NOTE — Telephone Encounter (Signed)
Prescription refill request for Xarelto received.  Indication:Afib Last office visit:5/23 Weight:99.3 kg Age:87 Scr:1.1 CrCl:68.96 ml/min  Prescription refilled

## 2022-04-02 ENCOUNTER — Telehealth: Payer: Self-pay | Admitting: Family Medicine

## 2022-04-02 ENCOUNTER — Other Ambulatory Visit: Payer: Self-pay

## 2022-04-02 ENCOUNTER — Encounter: Payer: Self-pay | Admitting: Family Medicine

## 2022-04-02 DIAGNOSIS — E039 Hypothyroidism, unspecified: Secondary | ICD-10-CM

## 2022-04-02 DIAGNOSIS — I10 Essential (primary) hypertension: Secondary | ICD-10-CM

## 2022-04-02 DIAGNOSIS — R739 Hyperglycemia, unspecified: Secondary | ICD-10-CM

## 2022-04-02 DIAGNOSIS — R7989 Other specified abnormal findings of blood chemistry: Secondary | ICD-10-CM

## 2022-04-02 DIAGNOSIS — E782 Mixed hyperlipidemia: Secondary | ICD-10-CM

## 2022-04-02 DIAGNOSIS — D649 Anemia, unspecified: Secondary | ICD-10-CM

## 2022-04-02 DIAGNOSIS — R351 Nocturia: Secondary | ICD-10-CM

## 2022-04-02 DIAGNOSIS — E538 Deficiency of other specified B group vitamins: Secondary | ICD-10-CM

## 2022-04-02 DIAGNOSIS — D696 Thrombocytopenia, unspecified: Secondary | ICD-10-CM

## 2022-04-02 NOTE — Telephone Encounter (Signed)
Called pt was advised of labs

## 2022-04-02 NOTE — Telephone Encounter (Signed)
Patient requesting to get labs done prior to appt. He would like to do them at the [facility] lab  Patient did not state which labs  He stated blyth usually rechecks some.

## 2022-04-02 NOTE — Addendum Note (Signed)
Addended by: Laure Kidney on: 04/02/2022 03:25 PM   Modules accepted: Orders

## 2022-04-03 ENCOUNTER — Other Ambulatory Visit: Payer: Self-pay

## 2022-04-03 MED ORDER — CHLORTHALIDONE 25 MG PO TABS: 25.0000 mg | ORAL_TABLET | Freq: Every morning | ORAL | 1 refills | Status: DC

## 2022-04-03 MED ORDER — LEVOTHYROXINE SODIUM 88 MCG PO TABS: 88.0000 ug | ORAL_TABLET | Freq: Every day | ORAL | 1 refills | Status: DC

## 2022-04-03 MED ORDER — RAMIPRIL 10 MG PO CAPS: 10.0000 mg | ORAL_CAPSULE | Freq: Two times a day (BID) | ORAL | 1 refills | Status: DC

## 2022-04-03 MED ORDER — METOPROLOL SUCCINATE ER 25 MG PO TB24: 12.5000 mg | ORAL_TABLET | Freq: Every day | ORAL | 1 refills | Status: DC

## 2022-04-07 ENCOUNTER — Other Ambulatory Visit (HOSPITAL_BASED_OUTPATIENT_CLINIC_OR_DEPARTMENT_OTHER): Payer: Self-pay

## 2022-04-07 MED ORDER — INFLUENZA VAC A&B SA ADJ QUAD 0.5 ML IM PRSY
PREFILLED_SYRINGE | INTRAMUSCULAR | 0 refills | Status: DC
  Filled 2022-04-07: qty 0.5, 1d supply, fill #0

## 2022-04-22 ENCOUNTER — Other Ambulatory Visit (HOSPITAL_BASED_OUTPATIENT_CLINIC_OR_DEPARTMENT_OTHER): Payer: Self-pay

## 2022-04-22 MED ORDER — AREXVY 120 MCG/0.5ML IM SUSR
INTRAMUSCULAR | 0 refills | Status: DC
  Filled 2022-04-22: qty 0.5, 1d supply, fill #0

## 2022-04-23 ENCOUNTER — Encounter: Payer: Self-pay | Admitting: Family Medicine

## 2022-04-27 NOTE — Progress Notes (Unsigned)
[place] Phone: [phone] Subjective:   Fontaine No, am serving as a scribe for Dr. Hulan Saas.  I'm seeing this patient by the request  of:  [name], MD  CC: Left knee and hip pain follow-up  [id number]  03/17/2022 Still has some discomfort in this area.  We will get x-rays to further evaluate how much is there is some arthritic changes.  Concerned that lumbar radiculopathy was in the differential as well.  Follow-up again in 2 to 3 months after home exercises focusing on hip abductor strengthening.  Likely arthritic changes of the contralateral knee as well.  Likely contributing to some of the discomfort and pain as well.  We discussed with patient that we could consider another injection.  Patient wants to start with home exercises first.  See how patient responds.  Then if worsening consider formal physical therapy.  Follow-up again in 2 to 3 months  Update 04/28/2022 DAVAUGHN HILLYARD is a 87 y.o. male coming in with complaint of L knee and L hip pain. Patient states that he is doing well. Placed metal shank back in shoe, diligently worked on stretching and uses orthotics which he feels has helped.    Xray L knee 03/17/2022 IMPRESSION: Very mild osteoarthritis.  No acute fracture.  Xray L hip 03/17/2022 IMPRESSION: 1. Mild left-greater-than-right sacroiliac osteoarthritis. 2. No significant osteoarthritis of either hip.      Past Medical History:  Diagnosis Date   Allergy    seasonal- mostly spring   Anemia 02/03/2011   ASTHMA, CHILDHOOD 03/17/2010   mostly as child   BCC (basal cell carcinoma) 12/08/2011   CAROTID ARTERY OCCLUSION, WITH INFARCTION 03/17/2010   CEREBROVASCULAR ACCIDENT 03/17/2010   partial loss of peripheral vision on left-"slight improved"   CHICKENPOX, HX OF 03/17/2010   Constipation 12/08/2014   Epistaxis 09/06/2013   FATIGUE 03/17/2010   Hearing loss 11/04/2010    bilateral   HYPERKALEMIA 05/27/2010   Hyperlipidemia    Hypertension    Hypothyroid 12/08/2011   Mixed hyperlipidemia 03/17/2010   Oral lesion 11/20/2014   Overweight(278.02) 07/01/2010   Peripheral neuropathy 08/18/2016   Personal history of other infectious and parasitic disease 03/17/2010   Preventative health care 08/23/2016   Skin lesion of right arm 11/20/2014   Sleep apnea    uses mouthpiece only   Thrombocytopenia ([place]) 04/05/2012   TOBACCO ABUSE, HX OF 03/17/2010   Tubular adenoma of colon 06/10/2011   Past Surgical History:  Procedure Laterality Date   CARDIOVERSION N/A 08/11/2019   Procedure: CARDIOVERSION;  Surgeon: Josue Hector, MD;  Location: [facility] ENDOSCOPY;  Service: Cardiovascular;  Laterality: N/A;   CARDIOVERSION N/A 12/05/2021   Procedure: CARDIOVERSION;  Surgeon: Josue Hector, MD;  Location: [place];  Service: Cardiovascular;  Laterality: N/A;   CAROTID ENDARTERECTOMY Left February 13, 2013   cea   CATARACT EXTRACTION, BILATERAL Right    Cataract, and stigmatism   COLONOSCOPY  Dec. 2014   ENDARTERECTOMY Left 02/13/2013   Procedure: ENDARTERECTOMY CAROTID;  Surgeon: Rosetta Posner, MD;  Location: [place];  Service: Vascular;  Laterality: Left;   EUS N/A 08/17/2013   Procedure: UPPER ENDOSCOPIC ULTRASOUND (EUS) LINEAR;  Surgeon: Milus Banister, MD;  Location: [facility] ENDOSCOPY;  Service: Endoscopy;  Laterality: N/A;   knee cartliage repair  1964, 1990   bilateral   PILONIDAL CYST EXCISION  1968   removed   ROTATOR CUFF REPAIR  right   TONSILLECTOMY     UPPER GI ENDOSCOPY  Jan. 2015   Social History   Socioeconomic History   Marital status: Divorced    Spouse name: Not on file   Number of children: 3   Years of education: 25yr   Highest education level: Bachelor's degree (e.g., BA, AB, BS)  Occupational History   Occupation: retired    EFish farm manager RETIRED  Tobacco Use   Smoking status: Former    Packs/day: 2.00    Years: 20.00    Total pack years: 40.00     Types: Cigarettes    Quit date: 07/19/1977    Years since quitting: 44.8   Smokeless tobacco: Never   Tobacco comments:    Former smoker 12/12/21  Vaping Use   Vaping Use: Never used  Substance and Sexual Activity   Alcohol use: Not Currently    Alcohol/week: 10.0 standard drinks of alcohol    Types: 7 Glasses of wine, 2 Cans of beer, 1 Shots of liquor per week   Drug use: No   Sexual activity: Not Currently  Other Topics Concern   Not on file  Social History Narrative   Pt lives at home alone.    Caffeine Use: very little   Heart healthy diet, is planning exercise      Retired from lEvent organiser  Left handed   Social Determinants of HBulpittStrain: LGifford (09/01/2021)   Overall Financial Resource Strain (CARDIA)    Difficulty of Paying Living Expenses: Not hard at all  Food Insecurity: No Food Insecurity (09/01/2021)   Hunger Vital Sign    Worried About Running Out of Food in the Last Year: Never true    RPiedmontin the Last Year: Never true  Transportation Needs: No Transportation Needs (09/01/2021)   PRAPARE - THydrologist(Medical): No    Lack of Transportation (Non-Medical): No  Physical Activity: Sufficiently Active (09/01/2021)   Exercise Vital Sign    Days of Exercise per Week: 4 days    Minutes of Exercise per Session: 40 min  Stress: No Stress Concern Present (09/01/2021)   [place]   Feeling of Stress : Not at all  Social Connections: Moderately Integrated (09/01/2021)   Social Connection and Isolation Panel [NHANES]    Frequency of Communication with Friends and Family: More than three times a week    Frequency of Social Gatherings with Friends and Family: More than three times a week    Attends Religious Services: More than 4 times per year    Active Member of [employer] Organizations: Yes    Attends CMusic therapist  More than 4 times per year    Marital Status: Divorced   No Known Allergies Family History  Problem Relation Age of Onset   Alzheimer's disease Mother    Emphysema Sister        cigarettes   Cancer Sister        lung, tobacco    Heart disease Son    Coronary artery disease Brother    Heart attack Brother    Other Brother        heart problems- from fathers side   Cancer Brother        lung? smoker   Heart disease Brother        Heart disease before age 85  Cancer Sister  gyn   Heart disease Sister    Bipolar disorder Daughter    Obesity Daughter    Stroke Father    Heart disease Sister    Esophageal cancer Neg Hx    Colon cancer Neg Hx    Pancreatic cancer Neg Hx    Stomach cancer Neg Hx     Current Outpatient Medications (Endocrine & Metabolic):    levothyroxine (SYNTHROID) 88 MCG tablet, Take 1 tablet (88 mcg total) by mouth daily before breakfast.  Current Outpatient Medications (Cardiovascular):    chlorthalidone (HYGROTON) 25 MG tablet, Take 1 tablet (25 mg total) by mouth every morning.   metoprolol succinate (TOPROL-XL) 25 MG 24 hr tablet, Take 0.5 tablets (12.5 mg total) by mouth daily.   ramipril (ALTACE) 10 MG capsule, Take 1 capsule (10 mg total) by mouth 2 (two) times daily.   rosuvastatin (CRESTOR) 40 MG tablet, TAKE 1 TABLET BY MOUTH IN  THE EVENING   furosemide (LASIX) 20 MG tablet, Take 1 Tablet for Three Days then PRN   Current Outpatient Medications (Analgesics):    acetaminophen (TYLENOL) 500 MG tablet, Take 1,000 mg by mouth as needed for moderate pain or headache.    aspirin EC 81 MG tablet, Take 81 mg by mouth every evening.   Current Outpatient Medications (Hematological):    vitamin B-12 (CYANOCOBALAMIN) 1000 MCG tablet, Take 2,000 mcg by mouth every Monday, Wednesday, and Friday.   XARELTO 20 MG TABS tablet, TAKE 1 TABLET BY MOUTH DAILY  WITH SUPPER  Current Outpatient Medications (Other):    cholecalciferol (VITAMIN D) 25 MCG (1000  UNIT) tablet, Take 1,000 Units by mouth daily.   COVID-19 mRNA bivalent vaccine, Moderna, (MODERNA COVID-19 BIVALENT) 50 MCG/0.5ML injection, Inject into the muscle.   gabapentin (NEURONTIN) 100 MG capsule, Takes 1 tablet by mouth as needed   influenza vaccine adjuvanted (FLUAD) 0.5 ML injection, Inject into the muscle.   lubiprostone (AMITIZA) 24 MCG capsule, Take 1 capsule (24 mcg total) by mouth 2 (two) times daily with a meal.   Multiple Vitamins-Minerals (MULTIVITAMIN WITH MINERALS) tablet, Take 1 tablet by mouth daily.   pantoprazole (PROTONIX) 40 MG tablet, TAKE 1 TABLET BY MOUTH  DAILY   Polyethyl Glycol-Propyl Glycol (SYSTANE ULTRA OP), Place 1 drop into both eyes in the morning.   RSV vaccine recomb adjuvanted (AREXVY) 120 MCG/0.5ML injection, Inject into the muscle.   TART CHERRY PO, Take 1 tablet by mouth daily.   Objective  Blood pressure 134/76, pulse 65, height '6\' 2"'$  (1.88 m), weight 218 lb (98.9 kg), SpO2 98 %.   General: No apparent distress alert and oriented x3 mood and affect normal, dressed appropriately.  HEENT: Pupils equal, extraocular movements intact  Respiratory: Patient's speak in full sentences and does not appear short of breath  Cardiovascular: No lower extremity edema, non tender, no erythema  Left knee exam does have good range of motion.  Mild crepitus noted.  Patient's hip nontender over the greater trochanteric area.  Patient is able to get out of a chair with no significant difficulty.    Impression and Recommendations:     The above documentation has been reviewed and is accurate and complete Lyndal Pulley, DO

## 2022-04-28 ENCOUNTER — Encounter: Payer: Self-pay | Admitting: Family Medicine

## 2022-04-28 ENCOUNTER — Ambulatory Visit: Payer: Medicare Other | Admitting: Family Medicine

## 2022-04-28 DIAGNOSIS — M216X1 Other acquired deformities of right foot: Secondary | ICD-10-CM | POA: Diagnosis not present

## 2022-04-28 NOTE — Assessment & Plan Note (Signed)
Using the carbon fiber again.  No significant discomfort in the hip or the knee.  At this point can follow-up as needed.  States that this is the best he has felt in years when it comes to this leg.

## 2022-05-12 ENCOUNTER — Ambulatory Visit: Payer: Medicare Other | Admitting: Family Medicine

## 2022-05-14 ENCOUNTER — Other Ambulatory Visit: Payer: Self-pay | Admitting: *Deleted

## 2022-05-14 DIAGNOSIS — I6522 Occlusion and stenosis of left carotid artery: Secondary | ICD-10-CM

## 2022-05-14 DIAGNOSIS — I6521 Occlusion and stenosis of right carotid artery: Secondary | ICD-10-CM

## 2022-05-21 ENCOUNTER — Other Ambulatory Visit (INDEPENDENT_AMBULATORY_CARE_PROVIDER_SITE_OTHER): Payer: Medicare Other

## 2022-05-21 DIAGNOSIS — I10 Essential (primary) hypertension: Secondary | ICD-10-CM

## 2022-05-21 DIAGNOSIS — E782 Mixed hyperlipidemia: Secondary | ICD-10-CM

## 2022-05-21 DIAGNOSIS — D696 Thrombocytopenia, unspecified: Secondary | ICD-10-CM | POA: Diagnosis not present

## 2022-05-21 DIAGNOSIS — R351 Nocturia: Secondary | ICD-10-CM

## 2022-05-21 DIAGNOSIS — R739 Hyperglycemia, unspecified: Secondary | ICD-10-CM

## 2022-05-21 LAB — CBC WITH DIFFERENTIAL/PLATELET
Basophils Absolute: 0 10*3/uL (ref 0.0–0.1)
Basophils Relative: 0.9 % (ref 0.0–3.0)
Eosinophils Absolute: 0.2 10*3/uL (ref 0.0–0.7)
Eosinophils Relative: 4.1 % (ref 0.0–5.0)
HCT: 39.5 % (ref 39.0–52.0)
Hemoglobin: 13.4 g/dL (ref 13.0–17.0)
Lymphocytes Relative: 30.1 % (ref 12.0–46.0)
Lymphs Abs: 1.6 10*3/uL (ref 0.7–4.0)
MCHC: 33.9 g/dL (ref 30.0–36.0)
MCV: 96.3 fl (ref 78.0–100.0)
Monocytes Absolute: 0.6 10*3/uL (ref 0.1–1.0)
Monocytes Relative: 12.1 % — ABNORMAL HIGH (ref 3.0–12.0)
Neutro Abs: 2.8 10*3/uL (ref 1.4–7.7)
Neutrophils Relative %: 52.8 % (ref 43.0–77.0)
Platelets: 152 10*3/uL (ref 150.0–400.0)
RBC: 4.1 Mil/uL — ABNORMAL LOW (ref 4.22–5.81)
RDW: 14.4 % (ref 11.5–15.5)
WBC: 5.3 10*3/uL (ref 4.0–10.5)

## 2022-05-21 LAB — COMPREHENSIVE METABOLIC PANEL
ALT: 15 U/L (ref 0–53)
AST: 19 U/L (ref 0–37)
Albumin: 4.6 g/dL (ref 3.5–5.2)
Alkaline Phosphatase: 67 U/L (ref 39–117)
BUN: 19 mg/dL (ref 6–23)
CO2: 30 mEq/L (ref 19–32)
Calcium: 9.7 mg/dL (ref 8.4–10.5)
Chloride: 102 mEq/L (ref 96–112)
Creatinine, Ser: 1.13 mg/dL (ref 0.40–1.50)
GFR: 59.34 mL/min — ABNORMAL LOW (ref >60.00)
Glucose, Bld: 101 mg/dL — ABNORMAL HIGH (ref 70–99)
Potassium: 4.6 mEq/L (ref 3.5–5.1)
Sodium: 139 mEq/L (ref 135–145)
Total Bilirubin: 0.8 mg/dL (ref 0.2–1.2)
Total Protein: 7.6 g/dL (ref 6.0–8.3)

## 2022-05-21 LAB — LIPID PANEL
Cholesterol: 158 mg/dL (ref 0–200)
HDL: 52.3 mg/dL (ref >39.00)
LDL Cholesterol: 79 mg/dL (ref 0–99)
NonHDL: 105.52
Total CHOL/HDL Ratio: 3
Triglycerides: 134 mg/dL (ref 0.0–149.0)
VLDL: 26.8 mg/dL (ref 0.0–40.0)

## 2022-05-21 LAB — TSH: TSH: 1.35 u[IU]/mL (ref 0.35–5.50)

## 2022-05-21 LAB — PSA: PSA: 0.4 ng/mL (ref 0.10–4.00)

## 2022-05-21 LAB — HEMOGLOBIN A1C: Hgb A1c MFr Bld: 5.7 % (ref 4.6–6.5)

## 2022-05-25 ENCOUNTER — Telehealth: Payer: Self-pay | Admitting: Family Medicine

## 2022-05-25 ENCOUNTER — Ambulatory Visit: Payer: Medicare Other | Admitting: Family Medicine

## 2022-05-25 VITALS — BP 132/72 | HR 55 | Temp 97.0°F | Ht 74.0 in | Wt 218.0 lb

## 2022-05-25 DIAGNOSIS — R350 Frequency of micturition: Secondary | ICD-10-CM

## 2022-05-25 DIAGNOSIS — E538 Deficiency of other specified B group vitamins: Secondary | ICD-10-CM | POA: Diagnosis not present

## 2022-05-25 DIAGNOSIS — R7989 Other specified abnormal findings of blood chemistry: Secondary | ICD-10-CM | POA: Diagnosis not present

## 2022-05-25 DIAGNOSIS — E039 Hypothyroidism, unspecified: Secondary | ICD-10-CM

## 2022-05-25 DIAGNOSIS — R040 Epistaxis: Secondary | ICD-10-CM

## 2022-05-25 DIAGNOSIS — I4819 Other persistent atrial fibrillation: Secondary | ICD-10-CM

## 2022-05-25 DIAGNOSIS — I10 Essential (primary) hypertension: Secondary | ICD-10-CM

## 2022-05-25 DIAGNOSIS — R739 Hyperglycemia, unspecified: Secondary | ICD-10-CM

## 2022-05-25 MED ORDER — MUPIROCIN 2 % EX OINT: 1.0000 | TOPICAL_OINTMENT | Freq: Every evening | CUTANEOUS | Status: DC | PRN

## 2022-05-25 NOTE — Telephone Encounter (Signed)
Will place future labs

## 2022-05-25 NOTE — Patient Instructions (Addendum)
Spry and [employer]ot[employer] for hydrating treatments for mouth and nose   Nosebleed, Adult A nosebleed is when blood comes out of the nose. Nosebleeds are common. Usually, they are not a sign of a serious condition. Nosebleeds can happen if a blood vessel in your nose starts to bleed or if the lining of your nose (mucous membrane) cracks. They are commonly caused by: Allergies. Colds. Picking your nose. Blowing your nose too hard. An injury from sticking an object into your nose or getting hit in the nose. Dry or cold air. Less common causes of nosebleeds include: Toxic fumes. Something abnormal in the nose or in the air-filled spaces in the bones of the face (sinuses). Growths in the nose, such as polyps. Blood thinners or conditions that cause blood to clot slowly. Certain illnesses or procedures that irritate or dry out the nasal passages. Follow these instructions at home: When you have a nosebleed:  Sit down and tilt your head slightly forward. Use a clean towel or tissue to pinch your nostrils under the bony part of your nose. After 5 minutes, let go of your nose and see if bleeding starts again. Do not release pressure before that time. If there is still bleeding, repeat the pinching and holding for 5 minutes or until the bleeding stops. Do not place tissues or gauze in the nose to stop the bleeding. Avoid lying down and avoid tilting your head backward. That may make blood collect in the throat and cause gagging or coughing. Use a nasal spray decongestant to help with a nosebleed as told by your health care provider. After a nosebleed: Avoid blowing your nose or sniffing for a number of hours. Avoid straining, lifting, or bending at the waist for several days. You may go back to other normal activities as you are able. If you are taking aspirin or blood thinners and you have nosebleeds, talk to your health care provider. These medicines make bleeding more likely. Ask your health care  provider if you should stop taking the medicines or if you should adjust the dose. Do not stop taking medicines that your health care provider has recommended unless he or she tells you to stop taking them. If your nosebleed was caused by dry mucous membranes, use over-the-counter saline nasal spray or gel and a humidifier as told by your health care provider. This will keep the mucous membranes moist and allow them to heal. If you need to use one of these products: Choose one that is water-soluble. Use only as much as you need and use it only as often as needed. Do not lie down right after you use it. If you get nosebleeds often, talk with your health care provider about medical treatments. Options may include: Nasal cautery. This treatment stops and prevents nosebleeds by using a chemical swab or electrical device to lightly burn tiny blood vessels inside the nose. Nasal packing. A gauze or other material is placed in the nose to keep constant pressure on the bleeding area. Contact a health care provider if you: Have a fever. Get nosebleeds often or more often than usual. Bruise very easily. Have a nosebleed from having something stuck in your nose. Have bleeding in your mouth. Vomit or cough up brown material. Have a nosebleed after you start a new medicine. Get help right away if: You have a nosebleed after a fall or a head injury. Your nosebleed does not go away after 20 minutes. You feel dizzy or weak. You have unusual  bleeding from other parts of your body. You have unusual bruising on other parts of your body. You become sweaty. You vomit blood. Summary A nosebleed is when blood comes out of the nose. Common causes include allergies, an injury to the nose, or cold or dry air. Initial treatment includes applying pressure for 5 minutes. Moisturizing the nose with saline nasal spray or gel after a nosebleed may help prevent future bleeding. Get help right away if your nosebleed does  not go away after 20 minutes. This information is not intended to replace advice given to you by your health care provider. Make sure you discuss any questions you have with your health care provider. Document Revised: 05/04/2019 Document Reviewed: 05/04/2019 Elsevier Patient Education  2022 [employer].

## 2022-05-25 NOTE — Progress Notes (Signed)
Subjective:   By signing my name below, I, Penni Homans MD, attest that this documentation has been prepared under the direction and in the presence of Penni Homans MD, 05/25/2022.   Patient ID: Tanner [name], male    DOB: 03/13/1935, 87 y.o.   MRN: [id number]  No chief complaint on file.   HPI Patient is in today for an office visit.  Epistaxis He is complaining of frequent nose bleeds and states that he can't blow his nose without it bleeding. He is able to control the bleeding when it occurs.  Sleep Patient is reporting that he wakes up in the morning tired. This does not occur everyday. He states that he uses a CPAP at night. He reports that he has an upcoming appointment with his pulmonologist. His only modifying factor is walking which helps.  Urination Patient is reporting to have foam with his urination. He denies any changes in frequency, color, or blood in urination.   Health Maintenance Due  Topic Date Due   FOOT EXAM  Never done   Diabetic kidney evaluation - Urine ACR  12/02/2016   TETANUS/TDAP  06/09/2021   OPHTHALMOLOGY EXAM  02/25/2022    Past Medical History:  Diagnosis Date   Allergy    seasonal- mostly spring   Anemia 02/03/2011   ASTHMA, CHILDHOOD 03/17/2010   mostly as child   BCC (basal cell carcinoma) 12/08/2011   CAROTID ARTERY OCCLUSION, WITH INFARCTION 03/17/2010   CEREBROVASCULAR ACCIDENT 03/17/2010   partial loss of peripheral vision on left-"slight improved"   CHICKENPOX, HX OF 03/17/2010   Constipation 12/08/2014   Epistaxis 09/06/2013   FATIGUE 03/17/2010   Hearing loss 11/04/2010   bilateral   HYPERKALEMIA 05/27/2010   Hyperlipidemia    Hypertension    Hypothyroid 12/08/2011   Mixed hyperlipidemia 03/17/2010   Oral lesion 11/20/2014   Overweight(278.02) 07/01/2010   Peripheral neuropathy 08/18/2016   Personal history of other infectious and parasitic disease 03/17/2010   Preventative health care 08/23/2016   Skin lesion of right arm 11/20/2014    Sleep apnea    uses mouthpiece only   Thrombocytopenia ([place]) 04/05/2012   TOBACCO ABUSE, HX OF 03/17/2010   Tubular adenoma of colon 06/10/2011    Past Surgical History:  Procedure Laterality Date   CARDIOVERSION N/A 08/11/2019   Procedure: CARDIOVERSION;  Surgeon: Josue Hector, MD;  Location: [facility] ENDOSCOPY;  Service: Cardiovascular;  Laterality: N/A;   CARDIOVERSION N/A 12/05/2021   Procedure: CARDIOVERSION;  Surgeon: Josue Hector, MD;  Location: [facility] ENDOSCOPY;  Service: Cardiovascular;  Laterality: N/A;   CAROTID ENDARTERECTOMY Left February 13, 2013   cea   CATARACT EXTRACTION, BILATERAL Right    Cataract, and stigmatism   COLONOSCOPY  Dec. 2014   ENDARTERECTOMY Left 02/13/2013   Procedure: ENDARTERECTOMY CAROTID;  Surgeon: Rosetta Posner, MD;  Location: [place];  Service: Vascular;  Laterality: Left;   EUS N/A 08/17/2013   Procedure: UPPER ENDOSCOPIC ULTRASOUND (EUS) LINEAR;  Surgeon: Milus Banister, MD;  Location: [facility] ENDOSCOPY;  Service: Endoscopy;  Laterality: N/A;   knee cartliage repair  1964, 1990   bilateral   PILONIDAL CYST EXCISION  1968   removed   ROTATOR CUFF REPAIR     right   TONSILLECTOMY     UPPER GI ENDOSCOPY  Jan. 2015    Family History  Problem Relation Age of Onset   Alzheimer's disease Mother    Emphysema Sister        cigarettes  Cancer Sister        lung, tobacco    Heart disease Son    Coronary artery disease Brother    Heart attack Brother    Other Brother        heart problems- from fathers side   Cancer Brother        lung? smoker   Heart disease Brother        Heart disease before age 59   Cancer Sister        gyn   Heart disease Sister    Bipolar disorder Daughter    Obesity Daughter    Stroke Father    Heart disease Sister    Esophageal cancer Neg Hx    Colon cancer Neg Hx    Pancreatic cancer Neg Hx    Stomach cancer Neg Hx     Social History   Socioeconomic History   Marital status: Divorced    Spouse name: Not on file    Number of children: 3   Years of education: 48yr   Highest education level: Bachelor's degree (e.g., BA, AB, BS)  Occupational History   Occupation: retired    EF[employer] farm manager RETIRED  Tobacco Use   Smoking status: Former    Packs/day: 2.00    Years: 20.00    Total pack years: 40.00    Types: Cigarettes    Quit date: 07/19/1977    Years since quitting: 44.8   Smokeless tobacco: Never   Tobacco comments:    Former smoker 12/12/21  Vaping Use   Vaping Use: Never used  Substance and Sexual Activity   Alcohol use: Not Currently    Alcohol/week: 10.0 standard drinks of alcohol    Types: 7 Glasses of wine, 2 Cans of beer, 1 Shots of liquor per week   Drug use: No   Sexual activity: Not Currently  Other Topics Concern   Not on file  Social History Narrative   Pt lives at home alone.    Caffeine Use: very little   Heart healthy diet, is planning exercise      Retired from lEvent organiser  Left handed   Social Determinants of HThree PointsStrain: [place] (09/01/2021)   Overall Financial Resource Strain (CARDIA)    Difficulty of Paying Living Expenses: Not hard at all  Food Insecurity: No Food Insecurity (09/01/2021)   Hunger Vital Sign    Worried About Running Out of Food in the Last Year: Never true    [place] the Last Year: Never true  Transportation Needs: No Transportation Needs (09/01/2021)   PRAPARE - THydrologist(Medical): No    Lack of Transportation (Non-Medical): No  Physical Activity: Sufficiently Active (09/01/2021)   Exercise Vital Sign    Days of Exercise per Week: 4 days    Minutes of Exercise per Session: 40 min  Stress: No Stress Concern Present (09/01/2021)   [place]   Feeling of Stress : Not at all  Social Connections: Moderately Integrated (09/01/2021)   Social Connection and Isolation Panel [NHANES]    Frequency of Communication with  Friends and Family: More than three times a week    Frequency of Social Gatherings with Friends and Family: More than three times a week    Attends Religious Services: More than 4 times per year    Active Member of Clubs or Organizations: Yes  Attends Archivist Meetings: More than 4 times per year    Marital Status: Divorced  Intimate Partner Violence: Not At Risk (09/01/2021)   Humiliation, Afraid, Rape, and Kick questionnaire    Fear of Current or Ex-Partner: No    Emotionally Abused: No    Physically Abused: No    Sexually Abused: No    Outpatient Medications Prior to Visit  Medication Sig Dispense Refill   acetaminophen (TYLENOL) 500 MG tablet Take 1,000 mg by mouth as needed for moderate pain or headache.      aspirin EC 81 MG tablet Take 81 mg by mouth every evening.      chlorthalidone (HYGROTON) 25 MG tablet Take 1 tablet (25 mg total) by mouth every morning. 90 tablet 1   cholecalciferol (VITAMIN D) 25 MCG (1000 UNIT) tablet Take 1,000 Units by mouth daily.     COVID-19 mRNA bivalent vaccine, Moderna, (MODERNA COVID-19 BIVALENT) 50 MCG/0.5ML injection Inject into the muscle. 0.5 mL 0   furosemide (LASIX) 20 MG tablet Take 1 Tablet for Three Days then PRN 30 tablet 11   gabapentin (NEURONTIN) 100 MG capsule Takes 1 tablet by mouth as needed     influenza vaccine adjuvanted (FLUAD) 0.5 ML injection Inject into the muscle. 0.5 mL 0   levothyroxine (SYNTHROID) 88 MCG tablet Take 1 tablet (88 mcg total) by mouth daily before breakfast. 90 tablet 1   lubiprostone (AMITIZA) 24 MCG capsule Take 1 capsule (24 mcg total) by mouth 2 (two) times daily with a meal. 90 capsule 3   metoprolol succinate (TOPROL-XL) 25 MG 24 hr tablet Take 0.5 tablets (12.5 mg total) by mouth daily. 90 tablet 1   Multiple Vitamins-Minerals (MULTIVITAMIN WITH MINERALS) tablet Take 1 tablet by mouth daily.     pantoprazole (PROTONIX) 40 MG tablet TAKE 1 TABLET BY MOUTH  DAILY 90 tablet 3   Polyethyl  Glycol-Propyl Glycol (SYSTANE ULTRA OP) Place 1 drop into both eyes in the morning.     ramipril (ALTACE) 10 MG capsule Take 1 capsule (10 mg total) by mouth 2 (two) times daily. 180 capsule 1   rosuvastatin (CRESTOR) 40 MG tablet TAKE 1 TABLET BY MOUTH IN  THE EVENING 90 tablet 3   RSV vaccine recomb adjuvanted (AREXVY) 120 MCG/0.5ML injection Inject into the muscle. 0.5 mL 0   TART CHERRY PO Take 1 tablet by mouth daily.     vitamin B-12 (CYANOCOBALAMIN) 1000 MCG tablet Take 2,000 mcg by mouth every Monday, Wednesday, and Friday.     XARELTO 20 MG TABS tablet TAKE 1 TABLET BY MOUTH DAILY  WITH SUPPER 90 tablet 3   No facility-administered medications prior to visit.    No Known Allergies  Review of Systems  HENT:         (+) epistaxis  Genitourinary:  Negative for frequency.       Objective:    Physical Exam Constitutional:      General: He is not in acute distress.    Appearance: Normal appearance. He is not ill-appearing.  HENT:     Head: Normocephalic and atraumatic.     Right Ear: External ear normal.     Left Ear: External ear normal.  Eyes:     Extraocular Movements: Extraocular movements intact.     Pupils: Pupils are equal, round, and reactive to light.  Cardiovascular:     Rate and Rhythm: Normal rate and regular rhythm.     Heart sounds: Normal heart sounds. No murmur heard.  No gallop.  Pulmonary:     Effort: Pulmonary effort is normal. No respiratory distress.     Breath sounds: Normal breath sounds. No wheezing or rales.  Skin:    General: Skin is warm and dry.  Neurological:     Mental Status: He is alert and oriented to person, place, and time.  Psychiatric:        Judgment: Judgment normal.     There were no vitals taken for this visit. Wt Readings from Last 3 Encounters:  04/28/22 218 lb (98.9 kg)  03/17/22 219 lb (99.3 kg)  02/02/22 212 lb (96.2 kg)       Assessment & Plan:   Problem List Items Addressed This Visit   None  No orders  of the defined types were placed in this encounter.   Cranston Neighbor MD, personally preformed the services described in this documentation.  All medical record entries made by the scribe were at my direction and in my presence.  I have reviewed the chart and discharge instructions (if applicable) and agree that the record reflects my personal performance and is accurate and complete. 05/25/2022.   I,Verona Buck,acting as a Education administrator for Penni Homans, MD.,have documented all relevant documentation on the behalf of Penni Homans, MD,as directed by  Penni Homans, MD while in the presence of Penni Homans, MD.    Luna Glasgow

## 2022-05-25 NOTE — Telephone Encounter (Signed)
Patient would like to come a week prior to his physical appointment, to get lab work done. Please advise.

## 2022-05-26 ENCOUNTER — Other Ambulatory Visit: Payer: Self-pay

## 2022-05-26 DIAGNOSIS — R351 Nocturia: Secondary | ICD-10-CM

## 2022-05-26 DIAGNOSIS — D696 Thrombocytopenia, unspecified: Secondary | ICD-10-CM

## 2022-05-26 DIAGNOSIS — I10 Essential (primary) hypertension: Secondary | ICD-10-CM

## 2022-05-26 DIAGNOSIS — E782 Mixed hyperlipidemia: Secondary | ICD-10-CM

## 2022-05-26 DIAGNOSIS — R739 Hyperglycemia, unspecified: Secondary | ICD-10-CM

## 2022-05-26 LAB — URINALYSIS
Bilirubin Urine: NEGATIVE
Hgb urine dipstick: NEGATIVE
Ketones, ur: NEGATIVE
Leukocytes,Ua: NEGATIVE
Nitrite: NEGATIVE
Specific Gravity, Urine: 1.015 (ref 1.000–1.030)
Total Protein, Urine: NEGATIVE
Urine Glucose: NEGATIVE
Urobilinogen, UA: 1 (ref 0.0–1.0)
pH: 6.5 (ref 5.0–8.0)

## 2022-05-26 LAB — VITAMIN D 25 HYDROXY (VIT D DEFICIENCY, FRACTURES): VITD: 55.35 ng/mL (ref 30.00–100.00)

## 2022-05-26 LAB — VITAMIN B12: Vitamin B-12: 716 pg/mL (ref 211–911)

## 2022-05-26 NOTE — Telephone Encounter (Signed)
Labs are ordered for future

## 2022-05-27 DIAGNOSIS — E538 Deficiency of other specified B group vitamins: Secondary | ICD-10-CM | POA: Insufficient documentation

## 2022-05-27 DIAGNOSIS — R350 Frequency of micturition: Secondary | ICD-10-CM | POA: Insufficient documentation

## 2022-05-27 NOTE — Assessment & Plan Note (Signed)
hgba1c acceptable, minimize simple carbs. Increase exercise as tolerated.  

## 2022-05-27 NOTE — Assessment & Plan Note (Signed)
Check UA and culture. He does note urine has been more foaming lately, no dysuria or blood noted

## 2022-05-27 NOTE — Assessment & Plan Note (Signed)
Continue to monitor

## 2022-05-27 NOTE — Assessment & Plan Note (Signed)
Supplement and monitor 

## 2022-05-27 NOTE — Assessment & Plan Note (Signed)
Rate controlled tolerating meds 

## 2022-05-27 NOTE — Assessment & Plan Note (Signed)
On Levothyroxine, continue to monitor 

## 2022-05-27 NOTE — Assessment & Plan Note (Signed)
Well controlled, no changes to meds. Encouraged heart healthy diet such as the DASH diet and exercise as tolerated.  °

## 2022-06-01 NOTE — Progress Notes (Unsigned)
Office Note     CC:  follow up Requesting Provider:  [name], MD  HPI: Tanner [name] is a 87 y.o. (12/04/1934) male who presents for routine follow up of carotid artery stenosis.  He underwent left carotid endarterectomy by [name] on February 13, 2013.  Prior to his surgery, he experienced left amaurosis fugax. He was found at that time to have occlusion of his right ICA. No intervention was taken on the right however he ultimately required left carotid endarterectomy for severe asymptomatic stenosis.  He had no complications. He has remained without any TIA or stroke like symptoms  Today he denies ***  The pt is on a statin for cholesterol management.  The pt is on a daily aspirin.   Other AC:  Xarelto The pt is on ACEI, BB for hypertension.   The pt is not diabetic.  Tobacco hx:  former  Past Medical History:  Diagnosis Date   Allergy    seasonal- mostly spring   Anemia 02/03/2011   ASTHMA, CHILDHOOD 03/17/2010   mostly as child   BCC (basal cell carcinoma) 12/08/2011   CAROTID ARTERY OCCLUSION, WITH INFARCTION 03/17/2010   CEREBROVASCULAR ACCIDENT 03/17/2010   partial loss of peripheral vision on left-"slight improved"   CHICKENPOX, HX OF 03/17/2010   Constipation 12/08/2014   Epistaxis 09/06/2013   FATIGUE 03/17/2010   Hearing loss 11/04/2010   bilateral   HYPERKALEMIA 05/27/2010   Hyperlipidemia    Hypertension    Hypothyroid 12/08/2011   Mixed hyperlipidemia 03/17/2010   Oral lesion 11/20/2014   Overweight(278.02) 07/01/2010   Peripheral neuropathy 08/18/2016   Personal history of other infectious and parasitic disease 03/17/2010   Preventative health care 08/23/2016   Skin lesion of right arm 11/20/2014   Sleep apnea    uses mouthpiece only   Thrombocytopenia ([place]) 04/05/2012   TOBACCO ABUSE, HX OF 03/17/2010   Tubular adenoma of colon 06/10/2011    Past Surgical History:  Procedure Laterality Date   CARDIOVERSION N/A 08/11/2019   Procedure: CARDIOVERSION;  Surgeon:  Josue Hector, MD;  Location: [facility] ENDOSCOPY;  Service: Cardiovascular;  Laterality: N/A;   CARDIOVERSION N/A 12/05/2021   Procedure: CARDIOVERSION;  Surgeon: Josue Hector, MD;  Location: [facility] ENDOSCOPY;  Service: Cardiovascular;  Laterality: N/A;   CAROTID ENDARTERECTOMY Left February 13, 2013   cea   CATARACT EXTRACTION, BILATERAL Right    Cataract, and stigmatism   COLONOSCOPY  Dec. 2014   ENDARTERECTOMY Left 02/13/2013   Procedure: ENDARTERECTOMY CAROTID;  Surgeon: Rosetta Posner, MD;  Location: [place];  Service: Vascular;  Laterality: Left;   EUS N/A 08/17/2013   Procedure: UPPER ENDOSCOPIC ULTRASOUND (EUS) LINEAR;  Surgeon: Milus Banister, MD;  Location: [facility] ENDOSCOPY;  Service: Endoscopy;  Laterality: N/A;   knee cartliage repair  1964, 1990   bilateral   PILONIDAL CYST EXCISION  1968   removed   ROTATOR CUFF REPAIR     right   TONSILLECTOMY     UPPER GI ENDOSCOPY  Jan. 2015    Social History   Socioeconomic History   Marital status: Divorced    Spouse name: Not on file   Number of children: 3   Years of education: 12yr   Highest education level: Bachelor's degree (e.g., BA, AB, BS)  Occupational History   Occupation: retired    [employer] farm manager RETIRED  Tobacco Use   Smoking status: Former    Packs/day: 2.00    Years: 20.00    Total pack  years: 40.00    Types: Cigarettes    Quit date: 07/19/1977    Years since quitting: 44.8   Smokeless tobacco: Never   Tobacco comments:    Former smoker 12/12/21  Vaping Use   Vaping Use: Never used  Substance and Sexual Activity   Alcohol use: Not Currently    Alcohol/week: 10.0 standard drinks of alcohol    Types: 7 Glasses of wine, 2 Cans of beer, 1 Shots of liquor per week   Drug use: No   Sexual activity: Not Currently  Other Topics Concern   Not on file  Social History Narrative   Pt lives at home alone.    Caffeine Use: very little   Heart healthy diet, is planning exercise      Retired from Event organiser   Left handed    Social Determinants of [place] Strain: [place]  (09/01/2021)   Overall Financial Resource Strain (CARDIA)    Difficulty of Paying Living Expenses: Not hard at all  Food Insecurity: No Food Insecurity (09/01/2021)   Hunger Vital Sign    Worried About Running Out of Food in the Last Year: Never true    [place] in the Last Year: Never true  Transportation Needs: No Transportation Needs (09/01/2021)   PRAPARE - Hydrologist (Medical): No    Lack of Transportation (Non-Medical): No  Physical Activity: Sufficiently Active (09/01/2021)   Exercise Vital Sign    Days of Exercise per Week: 4 days    Minutes of Exercise per Session: 40 min  Stress: No Stress Concern Present (09/01/2021)   [place]    Feeling of Stress : Not at all  Social Connections: Moderately Integrated (09/01/2021)   Social Connection and Isolation Panel [NHANES]    Frequency of Communication with Friends and Family: More than three times a week    Frequency of Social Gatherings with Friends and Family: More than three times a week    Attends Religious Services: More than 4 times per year    Active Member of [employer] or Organizations: Yes    Attends Music therapist: More than 4 times per year    Marital Status: Divorced  Intimate Partner Violence: Not At Risk (09/01/2021)   Humiliation, Afraid, Rape, and Kick questionnaire    Fear of Current or Ex-Partner: No    Emotionally Abused: No    Physically Abused: No    Sexually Abused: No    Family History  Problem Relation Age of Onset   Alzheimer's disease Mother    Emphysema Sister        cigarettes   Cancer Sister        lung, tobacco    Heart disease Son    Coronary artery disease Brother    Heart attack Brother    Other Brother        heart problems- from fathers side   Cancer Brother        lung? smoker   Heart disease Brother         Heart disease before age 80   Cancer Sister        gyn   Heart disease Sister    Bipolar disorder Daughter    Obesity Daughter    Stroke Father    Heart disease Sister    Esophageal cancer Neg Hx    Colon cancer Neg Hx  Pancreatic cancer Neg Hx    Stomach cancer Neg Hx     Current Outpatient Medications  Medication Sig Dispense Refill   acetaminophen (TYLENOL) 500 MG tablet Take 1,000 mg by mouth as needed for moderate pain or headache.      aspirin EC 81 MG tablet Take 81 mg by mouth every evening.      chlorthalidone (HYGROTON) 25 MG tablet Take 1 tablet (25 mg total) by mouth every morning. 90 tablet 1   cholecalciferol (VITAMIN D) 25 MCG (1000 UNIT) tablet Take 1,000 Units by mouth daily.     COVID-19 mRNA bivalent vaccine, Moderna, (MODERNA COVID-19 BIVALENT) 50 MCG/0.5ML injection Inject into the muscle. 0.5 mL 0   furosemide (LASIX) 20 MG tablet Take 1 Tablet for Three Days then PRN 30 tablet 11   gabapentin (NEURONTIN) 100 MG capsule Takes 1 tablet by mouth as needed     influenza vaccine adjuvanted (FLUAD) 0.5 ML injection Inject into the muscle. 0.5 mL 0   levothyroxine (SYNTHROID) 88 MCG tablet Take 1 tablet (88 mcg total) by mouth daily before breakfast. 90 tablet 1   lubiprostone (AMITIZA) 24 MCG capsule Take 1 capsule (24 mcg total) by mouth 2 (two) times daily with a meal. 90 capsule 3   metoprolol succinate (TOPROL-XL) 25 MG 24 hr tablet Take 0.5 tablets (12.5 mg total) by mouth daily. 90 tablet 1   Multiple Vitamins-Minerals (MULTIVITAMIN WITH MINERALS) tablet Take 1 tablet by mouth daily.     mupirocin ointment (BACTROBAN) 2 % Place 1 Application into the nose at bedtime as needed (via qtip). 22 g 01   pantoprazole (PROTONIX) 40 MG tablet TAKE 1 TABLET BY MOUTH  DAILY 90 tablet 3   Polyethyl Glycol-Propyl Glycol (SYSTANE ULTRA OP) Place 1 drop into both eyes in the morning.     ramipril (ALTACE) 10 MG capsule Take 1 capsule (10 mg total) by mouth 2 (two)  times daily. 180 capsule 1   rosuvastatin (CRESTOR) 40 MG tablet TAKE 1 TABLET BY MOUTH IN  THE EVENING 90 tablet 3   RSV vaccine recomb adjuvanted (AREXVY) 120 MCG/0.5ML injection Inject into the muscle. 0.5 mL 0   TART CHERRY PO Take 1 tablet by mouth daily.     vitamin B-12 (CYANOCOBALAMIN) 1000 MCG tablet Take 2,000 mcg by mouth every Monday, Wednesday, and Friday.     XARELTO 20 MG TABS tablet TAKE 1 TABLET BY MOUTH DAILY  WITH SUPPER 90 tablet 3   No current facility-administered medications for this visit.    No Known Allergies   REVIEW OF SYSTEMS:  *** '[X]'$  denotes positive finding, '[ ]'$  denotes negative finding Cardiac  Comments:  Chest pain or chest pressure:    Shortness of breath upon exertion:    Short of breath when lying flat:    Irregular heart rhythm:        Vascular    Pain in calf, thigh, or hip brought on by ambulation:    Pain in feet at night that wakes you up from your sleep:     Blood clot in your veins:    Leg swelling:         Pulmonary    Oxygen at home:    Productive cough:     Wheezing:         Neurologic    Sudden weakness in arms or legs:     Sudden numbness in arms or legs:     Sudden onset of difficulty speaking or slurred  speech:    Temporary loss of vision in one eye:     Problems with dizziness:         Gastrointestinal    Blood in stool:     Vomited blood:         Genitourinary    Burning when urinating:     Blood in urine:        Psychiatric    Major depression:         Hematologic    Bleeding problems:    Problems with blood clotting too easily:        Skin    Rashes or ulcers:        Constitutional    Fever or chills:      PHYSICAL EXAMINATION:  There were no vitals filed for this visit.  General:  WDWN in NAD; vital signs documented above Gait: Not observed HENT: WNL, normocephalic Pulmonary: normal non-labored breathing , without Rales, rhonchi,  wheezing Cardiac: {Desc; regular/irreg:14544} HR, without   Murmurs {With/Without:20273} carotid bruit*** Abdomen: soft, NT, no masses Skin: {With/Without:20273} rashes Vascular Exam/Pulses:  Right Left  Radial {Exam; arterial pulse strength 0-4:30167} {Exam; arterial pulse strength 0-4:30167}  Ulnar {Exam; arterial pulse strength 0-4:30167} {Exam; arterial pulse strength 0-4:30167}  Femoral {Exam; arterial pulse strength 0-4:30167} {Exam; arterial pulse strength 0-4:30167}  Popliteal {Exam; arterial pulse strength 0-4:30167} {Exam; arterial pulse strength 0-4:30167}  DP {Exam; arterial pulse strength 0-4:30167} {Exam; arterial pulse strength 0-4:30167}  PT {Exam; arterial pulse strength 0-4:30167} {Exam; arterial pulse strength 0-4:30167}   Extremities: {With/Without:20273} ischemic changes, {With/Without:20273} Gangrene , {With/Without:20273} cellulitis; {With/Without:20273} open wounds;  Musculoskeletal: no muscle wasting or atrophy  Neurologic: A&O X 3;  No focal weakness or paresthesias are detected Psychiatric:  The pt has {Desc; normal/abnormal:11317::"Normal"} affect.   Non-Invasive Vascular Imaging:   ***    ASSESSMENT/PLAN:: 87 y.o. male here for follow up for ***   -***   Karoline Caldwell, PA-C [facility] MD:   Roxanne Mins

## 2022-06-02 ENCOUNTER — Ambulatory Visit: Payer: Medicare Other | Admitting: Physician Assistant

## 2022-06-02 ENCOUNTER — Ambulatory Visit (HOSPITAL_COMMUNITY)
Admission: RE | Admit: 2022-06-02 | Discharge: 2022-06-02 | Disposition: A | Discharge status: Home / Self Care | Payer: Medicare Other | Source: Ambulatory Visit | Attending: Vascular Surgery | Admitting: Vascular Surgery

## 2022-06-02 VITALS — BP 159/84 | HR 52 | Temp 97.8°F | Resp 20 | Ht 74.0 in | Wt 218.3 lb

## 2022-06-02 DIAGNOSIS — I6522 Occlusion and stenosis of left carotid artery: Secondary | ICD-10-CM | POA: Insufficient documentation

## 2022-06-02 DIAGNOSIS — I6521 Occlusion and stenosis of right carotid artery: Secondary | ICD-10-CM | POA: Diagnosis not present

## 2022-06-07 ENCOUNTER — Other Ambulatory Visit: Payer: Self-pay | Admitting: Internal Medicine

## 2022-06-09 NOTE — Progress Notes (Signed)
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  LDL goal less than 70 - Normotensive blood pressure - follow up with PCP - Hgb A1c goal less than 70 2 If he has another flare up of nerve pain, will have him try gabapentin '100mg'$  up to three times daily as needed.  Cautioned for dizziness and drowsiness. 3  Follow up one year.  Subjective:  Tanner [name] is an 87 year old left-handed male with atrial fibrillation, hypertension, hyperlipidemia, hypothyroidism, OSA and former smoker who follows up for right MCA/PCA watershed infarct, carotid artery disease with right ICA occlusion and s/p left CEA, and peripheral neuropathy.   UPDATE: Current medications:  Xarelto, ASA '81mg'$ , Crestor '40mg'$ , Altace, Amitiza, Hygroton, Synthroid   Neuropathy is unchanged. He did have numbness in the left lower leg due to greater trochanteric bursitis, which has improved with stretches.    05/21/2022 LABS:  Hgb A1c 5.7, LDL 79 06/02/2022 CAROTID US:  right ICA occlusion, 1-39% left ICA stenosis  HISTORY: Stroke:   In August 2011, he had a stroke, presenting as left visual field cut.  He had a right hemispheric stroke thought to be secondary to right ICA occlusion.  MRI of brain revealed an acute watershed infarct in the right PCA/MCA distributions.  CTA of the neck confirmed right common carotid artery occlusion at the origin.  The left ICA revealed approximately 50% stenosis just distal to the bulb.  CTA of the head revealed occluded right ICA with partial  retrograde constitution from the left INCA via the PCA.  There was opacification of the right MCA and right ACA from the left ICA via the Acomm.  There was occlusion of the right PCA at its origin.  Hgb A1c at that time was 5.6 and LDL was 210.   He was placed on Plavix.  Follow up carotid dopplers revealed increased stenosis of the left ICA.  Carotid doppler from 01/03/13 revealed more than 70% stenosis involving the left bulb and left proximal ICA and 50-69% stenosis involving the left mid ICA, but without plaque.  Stenosis of left ECA and left VA noted.  CTA of the neck performed on 01/19/13 revealed 75-80% stenosis of the left ICA.  He underwent a left CEA on 02/13/13.     02/07/13 2D Echo:  LVEF 55-65%, no regional wall motion abnormalities, mild MVR, mildly dilated left atrium, mildly dilated right ventricle, mildly increased systolic pressure in pulCarotid doppler on 04/02/15 showed right ICA occluded.  Left ICA patent.  No plaque He followed up with vascular surgery on 04/07/16.   Carotid doppler performed then again revealed known right ICA occlusion with patent left carotid endarterectomy site, not significantly changed compared to last exam.  Carotid doppler from 05/23/19 showed recanalization of the right CCA, ECA and retrograde via ECA recanalization of the proximal right ICA.  Patent left carotid endarterectomy site with no evidence of restenosis.  Bilateral vertebral arteries with antegrade flow.  Carotid ultrasound from 05/23/2020 showed slight recanalization of the CCA, ECA and proximal ICA and 1-39% stenosis of left ICA.   Neuropathy:  He reports numbness on the bottom of his feet, mostly his toes.  He also has a discomfort involving the left shin, but it is not a pain.  He says she sometimes catches his toe of his left foot on the ground and may stumble.  This has been ongoing since his stroke.  He also reports having fractured his left ankle twice.  He does report crossing his legs.  He notes  muscle cramps in the legs, which bother him at night.  NCV-EMG from 04/28/16 revealed chronic symmetric sensorimotor polyneuropathy.  Neuropathy labs include:  ANA negative, Sed Rate 3, B6 43.8, SPEP/IFE/UPEP negative, B12 855, TSH 2.07, ACE 5.  PAST MEDICAL HISTORY: Past Medical History:  Diagnosis Date   Allergy    seasonal- mostly spring   Anemia 02/03/2011   ASTHMA, CHILDHOOD 03/17/2010   mostly as child   BCC (basal cell carcinoma) 12/08/2011   CAROTID ARTERY OCCLUSION, WITH INFARCTION 03/17/2010   CEREBROVASCULAR ACCIDENT 03/17/2010   partial loss of peripheral vision on left-"slight improved"   CHICKENPOX, HX OF 03/17/2010   Constipation 12/08/2014   Epistaxis 09/06/2013   FATIGUE 03/17/2010   Hearing loss 11/04/2010   bilateral   HYPERKALEMIA 05/27/2010   Hyperlipidemia    Hypertension    Hypothyroid 12/08/2011   Mixed hyperlipidemia 03/17/2010   Oral lesion 11/20/2014   Overweight(278.02) 07/01/2010   Peripheral neuropathy 08/18/2016   Personal history of other infectious and parasitic disease 03/17/2010   Preventative health care 08/23/2016   Skin lesion of right arm 11/20/2014   Sleep apnea    uses mouthpiece only   Thrombocytopenia ([place]) 04/05/2012   TOBACCO ABUSE, HX OF 03/17/2010   Tubular adenoma of colon 06/10/2011    MEDICATIONS: Current Outpatient Medications on File Prior to Visit  Medication Sig Dispense Refill   acetaminophen (TYLENOL) 500 MG tablet Take 1,000 mg by mouth as needed for moderate pain or headache.      aspirin EC 81 MG tablet Take 81 mg by mouth every evening.      chlorthalidone (HYGROTON) 25 MG tablet Take 1 tablet (25 mg total) by mouth every morning. 90 tablet 1   cholecalciferol (VITAMIN D) 25 MCG (1000 UNIT) tablet Take 1,000 Units by mouth daily.     COVID-19 mRNA bivalent vaccine, Moderna, (MODERNA COVID-19 BIVALENT) 50 MCG/0.5ML injection Inject into the muscle. 0.5 mL 0   gabapentin (NEURONTIN) 100 MG capsule Takes 1 tablet by mouth as needed      influenza vaccine adjuvanted (FLUAD) 0.5 ML injection Inject into the muscle. 0.5 mL 0   levothyroxine (SYNTHROID) 88 MCG tablet Take 1 tablet (88 mcg total) by mouth daily before breakfast. 90 tablet 1   lubiprostone (AMITIZA) 24 MCG capsule TAKE 1 CAPSULE BY MOUTH TWICE  DAILY WITH A MEAL 180 capsule 3   metoprolol succinate (TOPROL-XL) 25 MG 24 hr tablet Take 0.5 tablets (12.5 mg total) by mouth daily. 90 tablet 1   Multiple Vitamins-Minerals (MULTIVITAMIN WITH MINERALS) tablet Take 1 tablet by mouth daily.     mupirocin ointment (BACTROBAN) 2 % Place 1 Application into the nose at bedtime as needed (via qtip). 22 g 01   pantoprazole (PROTONIX) 40 MG tablet TAKE 1 TABLET BY MOUTH  DAILY 90 tablet 3   Polyethyl Glycol-Propyl Glycol (SYSTANE ULTRA OP) Place 1 drop into both eyes in the morning.     ramipril (ALTACE) 10 MG capsule Take 1 capsule (10 mg total) by mouth 2 (two) times daily. 180 capsule 1  rosuvastatin (CRESTOR) 40 MG tablet TAKE 1 TABLET BY MOUTH IN  THE EVENING 90 tablet 3   RSV vaccine recomb adjuvanted (AREXVY) 120 MCG/0.5ML injection Inject into the muscle. 0.5 mL 0   TART CHERRY PO Take 1 tablet by mouth daily.     vitamin B-12 (CYANOCOBALAMIN) 1000 MCG tablet Take 2,000 mcg by mouth every Monday, Wednesday, and Friday.     XARELTO 20 MG TABS tablet TAKE 1 TABLET BY MOUTH DAILY  WITH SUPPER 90 tablet 3   No current facility-administered medications on file prior to visit.    ALLERGIES: No Known Allergies  FAMILY HISTORY: Family History  Problem Relation Age of Onset   Alzheimer's disease Mother    Emphysema Sister        cigarettes   Cancer Sister        lung, tobacco    Heart disease Son    Coronary artery disease Brother    Heart attack Brother    Other Brother        heart problems- from fathers side   Cancer Brother        lung? smoker   Heart disease Brother        Heart disease before age 60   Cancer Sister        gyn   Heart disease Sister     Bipolar disorder Daughter    Obesity Daughter    Stroke Father    Heart disease Sister    Esophageal cancer Neg Hx    Colon cancer Neg Hx    Pancreatic cancer Neg Hx    Stomach cancer Neg Hx       Objective:  Blood pressure 136/69, pulse 70, height '6\' 2"'$  (1.88 m), weight 217 lb 9.6 oz (98.7 kg), SpO2 95 %. General: No acute distress.  Patient appears well-groomed.   Head:  Normocephalic/atraumatic Eyes:  Fundi examined but not visualized Neck: supple, no paraspinal tenderness, full range of motion Heart:  Regular rate and rhythm Neurological Exam: alert and oriented to person, place, and time.  Speech fluent and not dysarthric, language intact.  CN II-XII intact. Bulk and tone normal, muscle strength 5/5 throughout.  Sensation to pinprick and vibration reduced in feet up to knees  Deep tendon reflexes 2+ throughout.  Toes downgoing.  Finger to nose testing intact.  Gait normal, Romberg negative.   Metta Clines, DO  CC: Penni Homans, MD

## 2022-06-16 ENCOUNTER — Ambulatory Visit: Payer: Medicare Other | Admitting: Neurology

## 2022-06-16 ENCOUNTER — Encounter: Payer: Self-pay | Admitting: Neurology

## 2022-06-16 VITALS — BP 136/69 | HR 70 | Ht 74.0 in | Wt 217.6 lb

## 2022-06-16 DIAGNOSIS — G609 Hereditary and idiopathic neuropathy, unspecified: Secondary | ICD-10-CM

## 2022-06-16 DIAGNOSIS — I480 Paroxysmal atrial fibrillation: Secondary | ICD-10-CM | POA: Diagnosis not present

## 2022-06-16 DIAGNOSIS — I10 Essential (primary) hypertension: Secondary | ICD-10-CM | POA: Diagnosis not present

## 2022-06-16 DIAGNOSIS — I63231 Cerebral infarction due to unspecified occlusion or stenosis of right carotid arteries: Secondary | ICD-10-CM

## 2022-06-16 DIAGNOSIS — I6521 Occlusion and stenosis of right carotid artery: Secondary | ICD-10-CM

## 2022-07-29 ENCOUNTER — Encounter: Payer: Self-pay | Admitting: Family Medicine

## 2022-08-20 NOTE — Progress Notes (Signed)
CARDIOLOGY CONSULT NOTE       Patient ID: Tanner [name] MRN: [id number] DOB/AGE: 1934-08-29 88 y.o.  Admit date: (Not on file) Referring Physician: [name] Primary Physician: [name], MD Primary Cardiologist: [name] Cancel    HPI:  88 y.o. referred by Dr [name] for new onset afib in Oct 17, 2018 .  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, hypothyroidism and OSA using a mouth piece and not CPAP CHADVASC 6 on xarelto Had [facility] 08/11/19 and again 12/05/21   [facility] for neurology left homonymous hemianopsia from stroke Right ICA occlusion now post left CEA 10/16/12 Dr Donnetta Hutching  Dewaine Conger gabapentin for neuropathy   TTE 07/18/20 :  EF 60-65% mild AS mean gradient 7 mmhg mild MR LAE 42 mm Myovue normal in 07/19/19 no ischemia Carotid 05/23/20:  slight recanilaztion of RICA patient left CEA no stenosis   This patients CHA2DS2-VASc Score and unadjusted Ischemic Stroke Rate (% per year) is equal to 9.7 % stroke rate/year from a score of 6   Wife died about in October 16, 2020 They were divorced but good friends Retired Animal nutritionist. Two children   No cardiac complaints Toprol dose decreased by PA 12/12/21 for HR 55 and PR 212 msec   Some mild positional dizziness in am. Atypical pain in left shoulder that is more resting and not exertional  ROS All other systems reviewed and negative except as noted above  Past Medical History:  Diagnosis Date   Allergy    seasonal- mostly spring   Anemia 02/03/2011   ASTHMA, CHILDHOOD 03/17/2010   mostly as child   BCC (basal cell carcinoma) 12/08/2011   CAROTID ARTERY OCCLUSION, WITH INFARCTION 03/17/2010   CEREBROVASCULAR ACCIDENT 03/17/2010   partial loss of peripheral vision on left-"slight improved"   CHICKENPOX, HX OF 03/17/2010   Constipation 12/08/2014   Epistaxis 09/06/2013   FATIGUE 03/17/2010   Hearing loss 11/04/2010   bilateral   HYPERKALEMIA 05/27/2010   Hyperlipidemia    Hypertension    Hypothyroid 12/08/2011   Mixed  hyperlipidemia 03/17/2010   Oral lesion 11/20/2014   Overweight(278.02) 07/01/2010   Peripheral neuropathy 08/18/2016   Personal history of other infectious and parasitic disease 03/17/2010   Preventative health care 08/23/2016   Skin lesion of right arm 11/20/2014   Sleep apnea    uses mouthpiece only   Thrombocytopenia ([place]) 04/05/2012   TOBACCO ABUSE, HX OF 03/17/2010   Tubular adenoma of colon 06/10/2011    Family History  Problem Relation Age of Onset   Alzheimer's disease Mother    Emphysema Sister        cigarettes   Cancer Sister        lung, tobacco    Heart disease Son    Coronary artery disease Brother    Heart attack Brother    Other Brother        heart problems- from fathers side   Cancer Brother        lung? smoker   Heart disease Brother        Heart disease before age 42   Cancer Sister        gyn   Heart disease Sister    Bipolar disorder Daughter    Obesity Daughter    Stroke Father    Heart disease Sister    Esophageal cancer Neg Hx    Colon cancer Neg Hx    Pancreatic cancer Neg Hx    Stomach cancer Neg Hx  Social History   Socioeconomic History   Marital status: Divorced    Spouse name: Not on file   Number of children: 3   Years of education: 33yr   Highest education level: Bachelor's degree (e.g., BA, AB, BS)  Occupational History   Occupation: retired    EFish farm manager RETIRED  Tobacco Use   Smoking status: Former    Packs/day: 2.00    Years: 20.00    Total pack years: 40.00    Types: Cigarettes    Quit date: 07/19/1977    Years since quitting: 45.1    Passive exposure: Never   Smokeless tobacco: Never   Tobacco comments:    Former smoker 12/12/21  Vaping Use   Vaping Use: Never used  Substance and Sexual Activity   Alcohol use: Not Currently    Alcohol/week: 10.0 standard drinks of alcohol    Types: 7 Glasses of wine, 2 Cans of beer, 1 Shots of liquor per week   Drug use: No   Sexual activity: Not Currently  Other Topics Concern    Not on file  Social History Narrative   Pt lives at home alone.    Caffeine Use: very little   Heart healthy diet, is planning exercise      Retired from lEvent organiser  Left handed   Social Determinants of HAber[place]Strain: [place] (09/01/2021)   Overall Financial Resource Strain (CARDIA)    Difficulty of Paying Living Expenses: Not hard at all  Food Insecurity: No Food Insecurity (09/01/2021)   Hunger Vital Sign    Worried About Running Out of Food in the Last Year: Never true    R[place]iacoin the Last Year: Never true  Transportation Needs: No Transportation Needs (09/01/2021)   PRAPARE - THydrologist(Medical): No    Lack of Transportation (Non-Medical): No  Physical Activity: Sufficiently Active (09/01/2021)   Exercise Vital Sign    Days of Exercise per Week: 4 days    Minutes of Exercise per Session: 40 min  Stress: No Stress Concern Present (09/01/2021)   [place]   Feeling of Stress : Not at all  Social Connections: Moderately Integrated (09/01/2021)   Social Connection and Isolation Panel [NHANES]    Frequency of Communication with Friends and Family: More than three times a week    Frequency of Social Gatherings with Friends and Family: More than three times a week    Attends Religious Services: More than 4 times per year    Active Member of Clubs or Organizations: Yes    Attends CArchivistMeetings: More than 4 times per year    Marital Status: Divorced  Intimate Partner Violence: Not At Risk (09/01/2021)   Humiliation, Afraid, Rape, and Kick questionnaire    Fear of Current or Ex-Partner: No    Emotionally Abused: No    Physically Abused: No    Sexually Abused: No    Past Surgical History:  Procedure Laterality Date   CARDIOVERSION N/A 08/11/2019   Procedure: CARDIOVERSION;  Surgeon: NJosue Hector MD;  [facility];   Service: Cardiovascular;  Laterality: N/A;   CARDIOVERSION N/A 12/05/2021   Procedure: CARDIOVERSION;  Surgeon: NJosue Hector MD;  [facility];  Service: Cardiovascular;  Laterality: N/A;   CAROTID ENDARTERECTOMY Left February 13, 2013   cea   CATARACT EXTRACTION, BILATERAL Right    Cataract,  and stigmatism   COLONOSCOPY  Dec. 2014   ENDARTERECTOMY Left 02/13/2013   Procedure: ENDARTERECTOMY CAROTID;  Surgeon: Rosetta Posner, MD;  Location: [facility];  Service: Vascular;  Laterality: Left;   EUS N/A 08/17/2013   Procedure: UPPER ENDOSCOPIC ULTRASOUND (EUS) LINEAR;  Surgeon: Milus Banister, MD;  Location: [facility];  Service: Endoscopy;  Laterality: N/A;   knee cartliage repair  1964, 1990   bilateral   PILONIDAL CYST EXCISION  1968   removed   ROTATOR CUFF REPAIR     right   TONSILLECTOMY     UPPER GI ENDOSCOPY  Jan. 2015        Physical Exam: Blood pressure 126/70, pulse 75, height 6' 2"$  (1.88 m), weight 216 lb (98 kg), SpO2 95 %.   Affect appropriate Healthy:  appears stated age 67: normal Neck supple with no adenopathy JVP normal left CEA no bruits no thyromegaly Lungs clear with no wheezing and good diaphragmatic motion Heart:  S1/S2 AS murmur, no rub, gallop or click PMI normal Abdomen: benighn, BS positve, no tenderness, no AAA no bruit.  No HSM or HJR Distal pulses intact with no bruits No edema Neuro old left visual field defect  Skin warm and dry No muscular weakness   Labs:   Lab Results  Component Value Date   WBC 5.3 05/21/2022   HGB 13.4 05/21/2022   HCT 39.5 05/21/2022   MCV 96.3 05/21/2022   PLT 152.0 05/21/2022   No results for input(s): "NA", "K", "CL", "CO2", "BUN", "CREATININE", "CALCIUM", "PROT", "BILITOT", "ALKPHOS", "ALT", "AST", "GLUCOSE" in the last 168 hours.  Invalid input(s): "LABALBU" Lab Results  Component Value Date   CKTOTAL 95 03/10/2010   CKMB 3.6 03/10/2010   TROPONINI 0.01        NO INDICATION OF MYOCARDIAL  INJURY. 03/10/2010    Lab Results  Component Value Date   CHOL 158 05/21/2022   CHOL 147 11/04/2021   CHOL 162 04/15/2021   Lab Results  Component Value Date   HDL 52.30 05/21/2022   HDL 42.50 11/04/2021   HDL 54.80 04/15/2021   Lab Results  Component Value Date   LDLCALC 79 05/21/2022   LDLCALC 85 11/04/2021   LDLCALC 90 04/15/2021   Lab Results  Component Value Date   TRIG 134.0 05/21/2022   TRIG 95.0 11/04/2021   TRIG 87.0 04/15/2021   Lab Results  Component Value Date   CHOLHDL 3 05/21/2022   CHOLHDL 3 11/04/2021   CHOLHDL 3 04/15/2021   No results found for: "LDLDIRECT"    Radiology: No results found.  EKG: afib rate 66    ASSESSMENT AND PLAN:   1. AFib:  NSR post [facility] 08/11/19 and May 2023   continue Toprol and Xarelto 2. HTN:  Well controlled.  Continue current medications and low sodium Dash type diet.   3. AS/MR:  both mild by TTE  07/18/20 will update echo  4. Thyroid: continue current dose of synthroid TSH normal  5. HDL  Continue high dose crestor LDL at goal  6. CVA: carotids stable plavix d/c due to need for anticoagulation continue low dose ASA 81 mg Duplex 06/02/22  with no left sided dx post CEA and minor recanalization of previously occluded right ICA  TTE  F/U in a year   Signed: Jenkins Rouge 08/28/2022, 7:58 AM

## 2022-08-28 ENCOUNTER — Ambulatory Visit: Payer: Medicare Other | Attending: Cardiovascular Disease | Admitting: Cardiovascular Disease

## 2022-08-28 ENCOUNTER — Encounter: Payer: Self-pay | Admitting: Cardiovascular Disease

## 2022-08-28 VITALS — BP 126/70 | HR 75 | Ht 74.0 in | Wt 216.0 lb

## 2022-08-28 DIAGNOSIS — I350 Nonrheumatic aortic (valve) stenosis: Secondary | ICD-10-CM | POA: Diagnosis not present

## 2022-08-28 DIAGNOSIS — I6522 Occlusion and stenosis of left carotid artery: Secondary | ICD-10-CM | POA: Diagnosis not present

## 2022-08-28 DIAGNOSIS — I340 Nonrheumatic mitral (valve) insufficiency: Secondary | ICD-10-CM | POA: Diagnosis not present

## 2022-08-28 DIAGNOSIS — I480 Paroxysmal atrial fibrillation: Secondary | ICD-10-CM

## 2022-08-28 NOTE — Patient Instructions (Addendum)
Medication Instructions:  Your physician recommends that you continue on your current medications as directed. Please refer to the Current Medication list given to you today.  *If you need a refill on your cardiac medications before your next appointment, please call your pharmacy*  Lab Work: If you have labs (blood work) drawn today and your tests are completely normal, you will receive your results only by: [place] (if you have MyChart) OR A paper copy in the mail If you have any lab test that is abnormal or we need to change your treatment, we will call you to review the results.  Testing/Procedures: Your physician has requested that you have an echocardiogram. Echocardiography is a painless test that uses sound waves to create images of your heart. It provides your doctor with information about the size and shape of your heart and how well your heart's chambers and valves are working. This procedure takes approximately one hour. There are no restrictions for this procedure. Please do NOT wear cologne, perfume, aftershave, or lotions (deodorant is allowed). Please arrive 15 minutes prior to your appointment time.  Follow-Up: At [facility], you and your health needs are our priority.  As part of our continuing mission to provide you with exceptional heart care, we have created designated Provider Care Teams.  These Care Teams include your primary Cardiologist (physician) and Advanced Practice Providers (APPs -  Physician Assistants and Nurse Practitioners) who all work together to provide you with the care you need, when you need it.  We recommend signing up for the patient portal called "MyChart".  Sign up information is provided on this After Visit Summary.  MyChart is used to connect with patients for Virtual Visits (Telemedicine).  Patients are able to view lab/test results, encounter notes, upcoming appointments, etc.  Non-urgent messages can be sent to your provider as  well.   To learn more about what you can do with MyChart, go to [email or web address].    Your next appointment:   1 year(s)  Provider:   Jenkins Rouge, MD

## 2022-09-03 ENCOUNTER — Ambulatory Visit (INDEPENDENT_AMBULATORY_CARE_PROVIDER_SITE_OTHER): Payer: Medicare Other | Admitting: *Deleted

## 2022-09-03 DIAGNOSIS — Z0000 Encounter for general adult medical examination without abnormal findings: Secondary | ICD-10-CM

## 2022-09-03 NOTE — Progress Notes (Signed)
Subjective:   Tanner [name] is a 88 y.o. male who presents for Medicare Annual/Subsequent preventive examination.  I connected with  Tanner [name] on 09/03/22 by a audio enabled telemedicine application and verified that I am speaking with the correct person using two identifiers.  Patient Location: Home  Provider Location: Office/Clinic  I discussed the limitations of evaluation and management by telemedicine. The patient expressed understanding and agreed to proceed.   Review of Systems    Defer to PCP Cardiac Risk Factors include: advanced age (>59mn, >>86women);hypertension;dyslipidemia;male gender     Objective:    There were no vitals filed for this visit. There is no height or weight on file to calculate BMI.     09/03/2022    3:40 PM 06/16/2022    8:57 AM 12/05/2021   11:04 AM 09/01/2021    1:47 PM 06/10/2021    8:59 AM 07/30/2020    2:22 PM 06/07/2020    8:47 AM  Advanced Directives  Does Patient Have a Medical Advance Directive? Yes Yes Yes Yes No No Yes  Type of AParamedicof ACarverLiving will HQuincyLiving will HWestfirLiving will HMont[place]Living will     Does patient want to make changes to medical advance directive? No - Patient declined        Copy of HWheatlandin Chart? Yes - validated most recent copy scanned in chart (See row information)  Yes - validated most recent copy scanned in chart (See row information) Yes - validated most recent copy scanned in chart (See row information)     Would patient like information on creating a medical advance directive?      No - Patient declined     Current Medications (verified) Outpatient Encounter Medications as of 09/03/2022  Medication Sig   acetaminophen (TYLENOL) 500 MG tablet Take 1,000 mg by mouth as needed for moderate pain or headache.    aspirin EC 81 MG tablet Take 81 mg by mouth every  evening.    chlorthalidone (HYGROTON) 25 MG tablet Take 1 tablet (25 mg total) by mouth every morning.   cholecalciferol (VITAMIN D) 25 MCG (1000 UNIT) tablet Take 1,000 Units by mouth daily.   levothyroxine (SYNTHROID) 88 MCG tablet Take 1 tablet (88 mcg total) by mouth daily before breakfast.   lubiprostone (AMITIZA) 24 MCG capsule TAKE 1 CAPSULE BY MOUTH TWICE  DAILY WITH A MEAL   metoprolol succinate (TOPROL-XL) 25 MG 24 hr tablet Take 0.5 tablets (12.5 mg total) by mouth daily.   Multiple Vitamins-Minerals (MULTIVITAMIN WITH MINERALS) tablet Take 1 tablet by mouth daily.   mupirocin ointment (BACTROBAN) 2 % Place 1 Application into the nose at bedtime as needed (via qtip).   pantoprazole (PROTONIX) 40 MG tablet TAKE 1 TABLET BY MOUTH  DAILY   Polyethyl Glycol-Propyl Glycol (SYSTANE ULTRA OP) Place 1 drop into both eyes in the morning.   ramipril (ALTACE) 10 MG capsule Take 1 capsule (10 mg total) by mouth 2 (two) times daily.   rosuvastatin (CRESTOR) 40 MG tablet TAKE 1 TABLET BY MOUTH IN  THE EVENING   TART CHERRY PO Take 1 tablet by mouth daily.   vitamin B-12 (CYANOCOBALAMIN) 1000 MCG tablet Take 2,000 mcg by mouth every Monday, Wednesday, and Friday.   XARELTO 20 MG TABS tablet TAKE 1 TABLET BY MOUTH DAILY  WITH SUPPER   No facility-administered encounter medications on file as of 09/03/2022.  Allergies (verified) Patient has no known allergies.   History: Past Medical History:  Diagnosis Date   Allergy    seasonal- mostly spring   Anemia 02/03/2011   ASTHMA, CHILDHOOD 03/17/2010   mostly as child   BCC (basal cell carcinoma) 12/08/2011   CAROTID ARTERY OCCLUSION, WITH INFARCTION 03/17/2010   CEREBROVASCULAR ACCIDENT 03/17/2010   partial loss of peripheral vision on left-"slight improved"   CHICKENPOX, HX OF 03/17/2010   Constipation 12/08/2014   Epistaxis 09/06/2013   FATIGUE 03/17/2010   Hearing loss 11/04/2010   bilateral   HYPERKALEMIA 05/27/2010   Hyperlipidemia     Hypertension    Hypothyroid 12/08/2011   Mixed hyperlipidemia 03/17/2010   Oral lesion 11/20/2014   Overweight(278.02) 07/01/2010   Peripheral neuropathy 08/18/2016   Personal history of other infectious and parasitic disease 03/17/2010   Preventative health care 08/23/2016   Skin lesion of right arm 11/20/2014   Sleep apnea    uses mouthpiece only   Thrombocytopenia ([place]) 04/05/2012   TOBACCO ABUSE, HX OF 03/17/2010   Tubular adenoma of colon 06/10/2011   Past Surgical History:  Procedure Laterality Date   CARDIOVERSION N/A 08/11/2019   Procedure: CARDIOVERSION;  Surgeon: Josue Hector, MD;  Location: [facility] ENDOSCOPY;  Service: Cardiovascular;  Laterality: N/A;   CARDIOVERSION N/A 12/05/2021   Procedure: CARDIOVERSION;  Surgeon: Josue Hector, MD;  Location: [place];  Service: Cardiovascular;  Laterality: N/A;   CAROTID ENDARTERECTOMY Left February 13, 2013   cea   CATARACT EXTRACTION, BILATERAL Right    Cataract, and stigmatism   COLONOSCOPY  Dec. 2014   ENDARTERECTOMY Left 02/13/2013   Procedure: ENDARTERECTOMY CAROTID;  Surgeon: Rosetta Posner, MD;  Location: [place];  Service: Vascular;  Laterality: Left;   EUS N/A 08/17/2013   Procedure: UPPER ENDOSCOPIC ULTRASOUND (EUS) LINEAR;  Surgeon: Milus Banister, MD;  Location: [facility] ENDOSCOPY;  Service: Endoscopy;  Laterality: N/A;   knee cartliage repair  1964, 1990   bilateral   PILONIDAL CYST EXCISION  1968   removed   ROTATOR CUFF REPAIR     right   TONSILLECTOMY     UPPER GI ENDOSCOPY  Jan. 2015   Family History  Problem Relation Age of Onset   Alzheimer's disease Mother    Emphysema Sister        cigarettes   Cancer Sister        lung, tobacco    Heart disease Son    Coronary artery disease Brother    Heart attack Brother    Other Brother        heart problems- from fathers side   Cancer Brother        lung? smoker   Heart disease Brother        Heart disease before age 33   Cancer Sister        gyn   Heart disease Sister     Bipolar disorder Daughter    Obesity Daughter    Stroke Father    Heart disease Sister    Esophageal cancer Neg Hx    Colon cancer Neg Hx    Pancreatic cancer Neg Hx    Stomach cancer Neg Hx    Social History   Socioeconomic History   Marital status: Divorced    Spouse name: Not on file   Number of children: 3   Years of education: 59yr   Highest education level: Bachelor's degree (e.g., BA, AB, BS)  Occupational History   Occupation: retired  Employer: RETIRED  Tobacco Use   Smoking status: Former    Packs/day: 2.00    Years: 20.00    Total pack years: 40.00    Types: Cigarettes    Quit date: 07/19/1977    Years since quitting: 45.1    Passive exposure: Never   Smokeless tobacco: Never   Tobacco comments:    Former smoker 12/12/21  Vaping Use   Vaping Use: Never used  Substance and Sexual Activity   Alcohol use: Not Currently    Alcohol/week: 10.0 standard drinks of alcohol    Types: 7 Glasses of wine, 2 Cans of beer, 1 Shots of liquor per week   Drug use: No   Sexual activity: Not Currently  Other Topics Concern   Not on file  Social History Narrative   Pt lives at home alone.    Caffeine Use: very little   Heart healthy diet, is planning exercise      Retired from Event organiser   Left handed   Social Determinants of [place] Strain: [place]  (09/03/2022)   Overall Financial Resource Strain (CARDIA)    Difficulty of Paying Living Expenses: Not hard at all  Food Insecurity: No Food Insecurity (09/03/2022)   Hunger Vital Sign    Worried About Running Out of Food in the Last Year: Never true    [place] in the Last Year: Never true  Transportation Needs: No Transportation Needs (09/03/2022)   PRAPARE - Hydrologist (Medical): No    Lack of Transportation (Non-Medical): No  Physical Activity: Insufficiently Active (09/03/2022)   Exercise Vital Sign    Days of Exercise per Week: 3 days    Minutes of  Exercise per Session: 30 min  Stress: No Stress Concern Present (09/03/2022)   [place]    Feeling of Stress : Not at all  Social Connections: Moderately Integrated (09/03/2022)   Social Connection and Isolation Panel [NHANES]    Frequency of Communication with Friends and Family: More than three times a week    Frequency of Social Gatherings with Friends and Family: More than three times a week    Attends Religious Services: More than 4 times per year    Active Member of [employer] or Organizations: Yes    Attends Music therapist: More than 4 times per year    Marital Status: Divorced    Tobacco Counseling Counseling given: Not Answered Tobacco comments: Former smoker 12/12/21   Clinical Intake:  Pre-visit preparation completed: Yes  Pain : No/denies pain  Diabetes: No  How often do you need to have someone help you when you read instructions, pamphlets, or other written materials from your doctor or pharmacy?: 1 - Never   Activities of Daily Living    09/03/2022    2:58 PM  In your present state of health, do you have any difficulty performing the following activities:  Hearing? 1  Vision? 0  Difficulty concentrating or making decisions? 0  Walking or climbing stairs? 0  Dressing or bathing? 0  Doing errands, shopping? 0  Preparing Food and eating ? N  Using the Toilet? N  In the past six months, have you accidently leaked urine? N  Do you have problems with loss of bowel control? N  Managing your Medications? N  Managing your Finances? N  Housekeeping or managing your Housekeeping? N    Patient Care Team:  [name], MD as PCP - General Josue Hector, MD as PCP - Cardiology (Cardiology) Pieter Partridge, DO as Consulting Physician (Neurology) Early, Arvilla Meres, MD as Consulting Physician (Vascular Surgery) Druscilla Brownie, MD as Consulting Physician (Dermatology) Pyrtle, Lajuan Lines, MD as  Consulting Physician (Gastroenterology)  Indicate any recent Medical Services you may have received from other than [facility] providers in the past year (date may be approximate).     Assessment:   This is a routine wellness examination for T[place]an.  Hearing/Vision screen No results found.  Dietary issues and exercise activities discussed: Current Exercise Habits: Home exercise routine, Type of exercise: walking;stretching, Time (Minutes): 30, Frequency (Times/Week): 3, Weekly Exercise (Minutes/Week): 90, Intensity: Moderate, Exercise limited by: None identified   Goals Addressed   None    Depression Screen    09/03/2022    3:44 PM 05/25/2022    3:13 PM 11/11/2021    8:58 AM 09/01/2021    1:52 PM 04/17/2021    2:07 PM 04/11/2020    8:48 AM 03/31/2019    8:30 AM  PHQ 2/9 Scores  PHQ - 2 Score 0 0 0 0 0 0 0  PHQ- 9 Score     3 0 0    Fall Risk    09/03/2022    2:58 PM 06/16/2022    8:56 AM 05/25/2022    3:13 PM 11/11/2021    9:05 AM 09/01/2021    1:49 PM  [place] in the past year? 0 0 0 0 0  Number falls in past yr: 0 0 0 0 0  Injury with Fall? 0 0 0 0 0  Risk for fall due to : No Fall Risks   No Fall Risks   Follow up Falls evaluation completed  Falls evaluation completed Falls evaluation completed Falls prevention discussed    FALL RISK PREVENTION PERTAINING TO THE HOME:  Any stairs in or around the home? Yes  If so, are there any without handrails? No  Home free of loose throw rugs in walkways, pet beds, electrical cords, etc? Yes  Adequate lighting in your home to reduce risk of falls? Yes   ASSISTIVE DEVICES UTILIZED TO PREVENT FALLS:  Life alert?  Wears Apple watch Use of a cane, walker or w/c? No  Grab bars in the bathroom? Yes , in the shower Shower chair or bench in shower? No  Elevated toilet seat or a handicapped toilet? Yes   TIMED UP AND GO:  Was the test performed?  No, audio visit .    Cognitive Function:        09/03/2022    3:55 PM   6CIT Screen  What Year? 0 points  What month? 0 points  What time? 0 points  Count back from 20 0 points  Months in reverse 0 points  Repeat phrase 0 points  Total Score 0 points    Immunizations Immunization History  Administered Date(s) Administered   Fluad Quad(high Dose 65+) 04/25/2019, 04/11/2020, 04/17/2021, 04/07/2022   Influenza Split 04/05/2012   Influenza Whole 04/19/2009, 05/06/2011   Influenza, High Dose Seasonal PF 05/09/2013, 04/13/2016, 04/16/2017, 04/05/2018   Influenza-Unspecified 03/29/2014, 05/07/2015, 04/07/2022   Moderna Covid-19 Vaccine Bivalent Booster 16yr & up 04/16/2021, 01/14/2022, 04/14/2022   Moderna SARS-COV2 Booster Vaccination 05/13/2020, 11/04/2020   Moderna Sars-Covid-2 Vaccination 03/13/2019, 04/10/2019   Pneumococcal Conjugate-13 05/17/2014   Pneumococcal Polysaccharide-23 07/21/2007, 04/05/2018   Respiratory Syncytial Virus Vaccine,Recomb Aduvanted(Arexvy) 04/22/2022   Td 07/21/2003   Tdap  06/10/2011   Zoster Recombinat (Shingrix) 04/27/2017, 07/04/2017   Zoster, Live 07/21/2007    TDAP status: Up to date  Flu Vaccine status: Up to date  Pneumococcal vaccine status: Up to date  Covid-19 vaccine status: Information provided on how to obtain vaccines.   Qualifies for Shingles Vaccine? Yes   Zostavax completed Yes   Shingrix Completed?: Yes  Screening Tests Health Maintenance  Topic Date Due   FOOT EXAM  Never done   Diabetic kidney evaluation - Urine ACR  12/02/2016   DTaP/Tdap/Td (3 - Td or Tdap) 06/09/2021   COVID-19 Vaccine (6 - 2023-24 season) 06/09/2022   Medicare Annual Wellness (AWV)  09/01/2022   HEMOGLOBIN A1C  11/19/2022   OPHTHALMOLOGY EXAM  05/20/2023   Diabetic kidney evaluation - eGFR measurement  05/22/2023   Pneumonia Vaccine 7+ Years old  Completed   INFLUENZA VACCINE  Completed   Zoster Vaccines- Shingrix  Completed   HPV VACCINES  Aged Out   COLONOSCOPY (Pts 45-33yr Insurance coverage will need to be  confirmed)  Discontinued    Health Maintenance  Health Maintenance Due  Topic Date Due   FOOT EXAM  Never done   Diabetic kidney evaluation - Urine ACR  12/02/2016   DTaP/Tdap/Td (3 - Td or Tdap) 06/09/2021   COVID-19 Vaccine (6 - 2023-24 season) 06/09/2022   Medicare Annual Wellness (AWV)  09/01/2022    Colorectal cancer screening: No longer required.   Lung Cancer Screening: (Low Dose CT Chest recommended if Age 86-80years, 30 pack-year currently smoking OR have quit w/in 15years.) does not qualify.   Additional Screening:  Hepatitis C Screening: does not qualify  Vision Screening: Recommended annual ophthalmology exams for early detection of glaucoma and other disorders of the eye. Is the patient up to date with their annual eye exam?  Yes  Who is the provider or what is the name of the office in which the patient attends annual eye exams? Dr. [facility] If pt is not established with a provider, would they like to be referred to a provider to establish care? Yes .   Dental Screening: Recommended annual dental exams for proper oral hygiene  Community Resource Referral / Chronic Care Management: CRR required this visit?  Yes   CCM required this visit?  Yes      Plan:     I have personally reviewed and noted the following in the patient's chart:   Medical and social history Use of alcohol, tobacco or illicit drugs  Current medications and supplements including opioid prescriptions. Patient is not currently taking opioid prescriptions. Functional ability and status Nutritional status Physical activity Advanced directives List of other physicians Hospitalizations, surgeries, and ER visits in previous 12 months Vitals Screenings to include cognitive, depression, and falls Referrals and appointments  In addition, I have reviewed and discussed with patient certain preventive protocols, quality metrics, and best practice recommendations. A written  personalized care plan for preventive services as well as general preventive health recommendations were provided to patient.   Due to this being a telephonic visit, the after visit summary with patients personalized plan was offered to patient via mail or my-chart. Patient would like to access on my-chart.  BBeatris Ship [place]  09/03/2022   Nurse Notes: None

## 2022-09-03 NOTE — Patient Instructions (Signed)
Tanner [name] , Thank you for taking time to come for your Medicare Wellness Visit. I appreciate your ongoing commitment to your health goals. Please review the following plan we discussed and let me know if I can assist you in the future.   These are the goals we discussed:  Goals      Patient Stated     Would like to lose some weight        This is a list of the screening recommended for you and due dates:  Health Maintenance  Topic Date Due   Complete foot exam   Never done   Yearly kidney health urinalysis for diabetes  12/02/2016   DTaP/Tdap/Td vaccine (3 - Td or Tdap) 06/09/2021   COVID-19 Vaccine (6 - 2023-24 season) 06/09/2022   Hemoglobin A1C  11/19/2022   Eye exam for diabetics  05/20/2023   Yearly kidney function blood test for diabetes  05/22/2023   Medicare Annual Wellness Visit  09/04/2023   Pneumonia Vaccine  Completed   Flu Shot  Completed   Zoster (Shingles) Vaccine  Completed   HPV Vaccine  Aged Out   Colon Cancer Screening  Discontinued     Next appointment: Follow up in one year for your annual wellness visit.   Preventive Care 20 Years and Older, Male Preventive care refers to lifestyle choices and visits with your health care provider that can promote health and wellness. What does preventive care include? A yearly physical exam. This is also called an annual well check. Dental exams once or twice a year. Routine eye exams. Ask your health care provider how often you should have your eyes checked. Personal lifestyle choices, including: Daily care of your teeth and gums. Regular physical activity. Eating a healthy diet. Avoiding tobacco and drug use. Limiting alcohol use. Practicing safe sex. Taking low doses of aspirin every day. Taking vitamin and mineral supplements as recommended by your health care provider. What happens during an annual well check? The services and screenings done by your health care provider during your annual well check  will depend on your age, overall health, lifestyle risk factors, and family history of disease. Counseling  Your health care provider may ask you questions about your: Alcohol use. Tobacco use. Drug use. Emotional well-being. Home and relationship well-being. Sexual activity. Eating habits. History of falls. Memory and ability to understand (cognition). Work and work Statistician. Screening  You may have the following tests or measurements: Height, weight, and BMI. Blood pressure. Lipid and cholesterol levels. These may be checked every 5 years, or more frequently if you are over 41 years old. Skin check. Lung cancer screening. You may have this screening every year starting at age 79 if you have a 30-pack-year history of smoking and currently smoke or have quit within the past 15 years. Fecal occult blood test (FOBT) of the stool. You may have this test every year starting at age 56. Flexible sigmoidoscopy or colonoscopy. You may have a sigmoidoscopy every 5 years or a colonoscopy every 10 years starting at age 64. Prostate cancer screening. Recommendations will vary depending on your family history and other risks. Hepatitis C blood test. Hepatitis B blood test. Sexually transmitted disease (STD) testing. Diabetes screening. This is done by checking your blood sugar (glucose) after you have not eaten for a while (fasting). You may have this done every 1-3 years. Abdominal aortic aneurysm (AAA) screening. You may need this if you are a current or former smoker. Osteoporosis. You may  be screened starting at age 11 if you are at high risk. Talk with your health care provider about your test results, treatment options, and if necessary, the need for more tests. Vaccines  Your health care provider may recommend certain vaccines, such as: Influenza vaccine. This is recommended every year. Tetanus, diphtheria, and acellular pertussis (Tdap, Td) vaccine. You may need a Td booster every 10  years. Zoster vaccine. You may need this after age 18. Pneumococcal 13-valent conjugate (PCV13) vaccine. One dose is recommended after age 74. Pneumococcal polysaccharide (PPSV23) vaccine. One dose is recommended after age 32. Talk to your health care provider about which screenings and vaccines you need and how often you need them. This information is not intended to replace advice given to you by your health care provider. Make sure you discuss any questions you have with your health care provider. Document Released: 08/02/2015 Document Revised: 03/25/2016 Document Reviewed: 05/07/2015 Elsevier Interactive Patient Education  2017 [employer] Prevention in the Home Falls can cause injuries. They can happen to people of all ages. There are many things you can do to make your home safe and to help prevent falls. What can I do on the outside of my home? Regularly fix the edges of walkways and driveways and fix any cracks. Remove anything that might make you trip as you walk through a door, such as a raised step or threshold. Trim any bushes or trees on the path to your home. Use bright outdoor lighting. Clear any walking paths of anything that might make someone trip, such as rocks or tools. Regularly check to see if handrails are loose or broken. Make sure that both sides of any steps have handrails. Any raised decks and porches should have guardrails on the edges. Have any leaves, snow, or ice cleared regularly. Use sand or salt on walking paths during winter. Clean up any spills in your garage right away. This includes oil or grease spills. What can I do in the bathroom? Use night lights. Install grab bars by the toilet and in the tub and shower. Do not use towel bars as grab bars. Use non-skid mats or decals in the tub or shower. If you need to sit down in the shower, use a plastic, non-slip stool. Keep the floor dry. Clean up any water that spills on the floor as soon as it  happens. Remove soap buildup in the tub or shower regularly. Attach bath mats securely with double-sided non-slip rug tape. Do not have throw rugs and other things on the floor that can make you trip. What can I do in the bedroom? Use night lights. Make sure that you have a light by your bed that is easy to reach. Do not use any sheets or blankets that are too big for your bed. They should not hang down onto the floor. Have a firm chair that has side arms. You can use this for support while you get dressed. Do not have throw rugs and other things on the floor that can make you trip. What can I do in the kitchen? Clean up any spills right away. Avoid walking on wet floors. Keep items that you use a lot in easy-to-reach places. If you need to reach something above you, use a strong step stool that has a grab bar. Keep electrical cords out of the way. Do not use floor polish or wax that makes floors slippery. If you must use wax, use non-skid floor wax. Do not  have throw rugs and other things on the floor that can make you trip. What can I do with my stairs? Do not leave any items on the stairs. Make sure that there are handrails on both sides of the stairs and use them. Fix handrails that are broken or loose. Make sure that handrails are as long as the stairways. Check any carpeting to make sure that it is firmly attached to the stairs. Fix any carpet that is loose or worn. Avoid having throw rugs at the top or bottom of the stairs. If you do have throw rugs, attach them to the floor with carpet tape. Make sure that you have a light switch at the top of the stairs and the bottom of the stairs. If you do not have them, ask someone to add them for you. What else can I do to help prevent falls? Wear shoes that: Do not have high heels. Have rubber bottoms. Are comfortable and fit you well. Are closed at the toe. Do not wear sandals. If you use a stepladder: Make sure that it is fully opened.  Do not climb a closed stepladder. Make sure that both sides of the stepladder are locked into place. Ask someone to hold it for you, if possible. Clearly mark and make sure that you can see: Any grab bars or handrails. First and last steps. Where the edge of each step is. Use tools that help you move around (mobility aids) if they are needed. These include: Canes. Walkers. Scooters. Crutches. Turn on the lights when you go into a dark area. Replace any light bulbs as soon as they burn out. Set up your furniture so you have a clear path. Avoid moving your furniture around. If any of your floors are uneven, fix them. If there are any pets around you, be aware of where they are. Review your medicines with your doctor. Some medicines can make you feel dizzy. This can increase your chance of falling. Ask your doctor what other things that you can do to help prevent falls. This information is not intended to replace advice given to you by your health care provider. Make sure you discuss any questions you have with your health care provider. Document Released: 05/02/2009 Document Revised: 12/12/2015 Document Reviewed: 08/10/2014 Elsevier Interactive Patient Education  2017 [employer].

## 2022-09-22 ENCOUNTER — Ambulatory Visit (HOSPITAL_COMMUNITY): Payer: Medicare Other | Attending: Cardiovascular Disease

## 2022-09-22 DIAGNOSIS — I350 Nonrheumatic aortic (valve) stenosis: Secondary | ICD-10-CM | POA: Diagnosis not present

## 2022-09-22 DIAGNOSIS — I340 Nonrheumatic mitral (valve) insufficiency: Secondary | ICD-10-CM | POA: Diagnosis not present

## 2022-09-22 LAB — ECHOCARDIOGRAM COMPLETE
AR max vel: 1.81 cm2
AV Area VTI: 1.77 cm2
AV Area mean vel: 1.78 cm2
AV Mean grad: 9 mmHg
AV Peak grad: 16.8 mmHg
Ao pk vel: 2.05 m/s
Area-P 1/2: 2.35 cm2
P 1/2 time: 530 msec
S' Lateral: 3.3 cm

## 2022-09-23 ENCOUNTER — Other Ambulatory Visit: Payer: Self-pay | Admitting: Family Medicine

## 2022-09-23 ENCOUNTER — Telehealth: Payer: Self-pay

## 2022-09-23 DIAGNOSIS — I340 Nonrheumatic mitral (valve) insufficiency: Secondary | ICD-10-CM

## 2022-09-23 DIAGNOSIS — I350 Nonrheumatic aortic (valve) stenosis: Secondary | ICD-10-CM

## 2022-09-23 NOTE — Telephone Encounter (Signed)
-----   Message from Josue Hector, MD sent at 09/23/2022 11:24 AM EST ----- Mild AS mild to mod MR EF normal f/u TTE in a year

## 2022-09-23 NOTE — Telephone Encounter (Signed)
The patient has been notified of the result and verbalized understanding.  All questions (if any) were answered. Ewell Poe [place], RN 09/23/2022 3:38 PM   Place order for echo in 1 year.

## 2022-09-29 ENCOUNTER — Encounter: Payer: Self-pay | Admitting: Family Medicine

## 2022-10-06 ENCOUNTER — Ambulatory Visit: Payer: Medicare Other | Admitting: Podiatry

## 2022-10-08 ENCOUNTER — Ambulatory Visit: Payer: Medicare Other | Admitting: Podiatry

## 2022-10-08 ENCOUNTER — Encounter: Payer: Self-pay | Admitting: Podiatry

## 2022-10-08 DIAGNOSIS — S90229A Contusion of unspecified lesser toe(s) with damage to nail, initial encounter: Secondary | ICD-10-CM

## 2022-10-12 NOTE — Progress Notes (Signed)
Subjective:  Patient ID: Tanner [name], male    DOB: 1934/07/26,  MRN: [id number] HPI Chief Complaint  Patient presents with   Nail Problem    Hallux bilateral - Nov 2023 - outside doing some work and took off shoes and noticed nails were dark, still dark, but now the left one is loose, PCP recommended visit here   New Patient (Initial Visit)    87 y.o. male presents with the above complaint.   ROS: Denies fever chills nausea vomit muscle aches pains calf pain back pain chest pain shortness of breath.  Past Medical History:  Diagnosis Date   Allergy    seasonal- mostly spring   Anemia 02/03/2011   ASTHMA, CHILDHOOD 03/17/2010   mostly as child   BCC (basal cell carcinoma) 12/08/2011   CAROTID ARTERY OCCLUSION, WITH INFARCTION 03/17/2010   CEREBROVASCULAR ACCIDENT 03/17/2010   partial loss of peripheral vision on left-"slight improved"   CHICKENPOX, HX OF 03/17/2010   Constipation 12/08/2014   Epistaxis 09/06/2013   FATIGUE 03/17/2010   Hearing loss 11/04/2010   bilateral   HYPERKALEMIA 05/27/2010   Hyperlipidemia    Hypertension    Hypothyroid 12/08/2011   Mixed hyperlipidemia 03/17/2010   Oral lesion 11/20/2014   Overweight(278.02) 07/01/2010   Peripheral neuropathy 08/18/2016   Personal history of other infectious and parasitic disease 03/17/2010   Preventative health care 08/23/2016   Skin lesion of right arm 11/20/2014   Sleep apnea    uses mouthpiece only   Thrombocytopenia ([place]) 04/05/2012   TOBACCO ABUSE, HX OF 03/17/2010   Tubular adenoma of colon 06/10/2011   Past Surgical History:  Procedure Laterality Date   CARDIOVERSION N/A 08/11/2019   Procedure: CARDIOVERSION;  Surgeon: Josue Hector, MD;  Location: [facility] ENDOSCOPY;  Service: Cardiovascular;  Laterality: N/A;   CARDIOVERSION N/A 12/05/2021   Procedure: CARDIOVERSION;  Surgeon: Josue Hector, MD;  Location: [place];  Service: Cardiovascular;  Laterality: N/A;   CAROTID ENDARTERECTOMY Left February 13, 2013   cea    CATARACT EXTRACTION, BILATERAL Right    Cataract, and stigmatism   COLONOSCOPY  Dec. 2014   ENDARTERECTOMY Left 02/13/2013   Procedure: ENDARTERECTOMY CAROTID;  Surgeon: Rosetta Posner, MD;  Location: [place];  Service: Vascular;  Laterality: Left;   EUS N/A 08/17/2013   Procedure: UPPER ENDOSCOPIC ULTRASOUND (EUS) LINEAR;  Surgeon: Milus Banister, MD;  Location: [facility] ENDOSCOPY;  Service: Endoscopy;  Laterality: N/A;   knee cartliage repair  1964, 1990   bilateral   PILONIDAL CYST EXCISION  1968   removed   ROTATOR CUFF REPAIR     right   TONSILLECTOMY     UPPER GI ENDOSCOPY  Jan. 2015    Current Outpatient Medications:    acetaminophen (TYLENOL) 500 MG tablet, Take 1,000 mg by mouth as needed for moderate pain or headache. , Disp: , Rfl:    aspirin EC 81 MG tablet, Take 81 mg by mouth every evening. , Disp: , Rfl:    chlorthalidone (HYGROTON) 25 MG tablet, TAKE 1 TABLET BY MOUTH EVERY  MORNING, Disp: 90 tablet, Rfl: 1   cholecalciferol (VITAMIN D) 25 MCG (1000 UNIT) tablet, Take 1,000 Units by mouth daily., Disp: , Rfl:    levothyroxine (SYNTHROID) 88 MCG tablet, TAKE 1 TABLET BY MOUTH DAILY  BEFORE BREAKFAST, Disp: 90 tablet, Rfl: 1   lubiprostone (AMITIZA) 24 MCG capsule, TAKE 1 CAPSULE BY MOUTH TWICE  DAILY WITH A MEAL, Disp: 180 capsule, Rfl: 3   metoprolol succinate (  TOPROL-XL) 25 MG 24 hr tablet, Take 0.5 tablets (12.5 mg total) by mouth daily., Disp: 90 tablet, Rfl: 1   Multiple Vitamins-Minerals (MULTIVITAMIN WITH MINERALS) tablet, Take 1 tablet by mouth daily., Disp: , Rfl:    mupirocin ointment (BACTROBAN) 2 %, Place 1 Application into the nose at bedtime as needed (via qtip)., Disp: 22 g, Rfl: 01   pantoprazole (PROTONIX) 40 MG tablet, TAKE 1 TABLET BY MOUTH  DAILY, Disp: 90 tablet, Rfl: 3   Polyethyl Glycol-Propyl Glycol (SYSTANE ULTRA OP), Place 1 drop into both eyes in the morning., Disp: , Rfl:    ramipril (ALTACE) 10 MG capsule, TAKE 1 CAPSULE BY MOUTH TWICE  DAILY, Disp: 180  capsule, Rfl: 1   rosuvastatin (CRESTOR) 40 MG tablet, TAKE 1 TABLET BY MOUTH IN  THE EVENING, Disp: 90 tablet, Rfl: 3   TART CHERRY PO, Take 1 tablet by mouth daily., Disp: , Rfl:    vitamin B-12 (CYANOCOBALAMIN) 1000 MCG tablet, Take 2,000 mcg by mouth every Monday, Wednesday, and Friday., Disp: , Rfl:    XARELTO 20 MG TABS tablet, TAKE 1 TABLET BY MOUTH DAILY  WITH SUPPER, Disp: 90 tablet, Rfl: 3  No Known Allergies Review of Systems Objective:  There were no vitals filed for this visit.  General: Well developed, nourished, in no acute distress, alert and oriented x3   Dermatological: Skin is warm, dry and supple bilateral. Nails x 10 are well maintained; remaining integument appears unremarkable at this time. There are no open sores, no preulcerative lesions, no rash or signs of infection present.  Subungual hematoma hallux bilateral right is more stable than the left.  Second digit left as well.  Lateral portions of the nail have raised up and they were trimmed off and removed today.  Vascular: Dorsalis Pedis artery and Posterior Tibial artery pedal pulses are 2/4 bilateral with immedate capillary fill time. Pedal hair growth present. No varicosities and no lower extremity edema present bilateral.   Neruologic: Grossly intact via light touch bilateral. Vibratory intact via tuning fork bilateral. Protective threshold with Semmes Wienstein monofilament intact to all pedal sites bilateral. Patellar and Achilles deep tendon reflexes 2+ bilateral. No Babinski or clonus noted bilateral.   Musculoskeletal: No gross boney pedal deformities bilateral. No pain, crepitus, or limitation noted with foot and ankle range of motion bilateral. Muscular strength 5/5 in all groups tested bilateral.  Gait: Unassisted, Nonantalgic.    Radiographs:  None taken  Assessment & Plan:   Assessment: Subungual hematoma hallux bilateral second digit left noncomplicated.  Plan: Removed portions of the nails  today despite debridement follow-up with them as needed.  We did discuss appropriate shoe gear     Suha Schoenbeck T. [place], [place]

## 2022-11-02 ENCOUNTER — Encounter: Payer: Self-pay | Admitting: *Deleted

## 2022-12-08 ENCOUNTER — Encounter: Payer: Medicare Other | Admitting: Family Medicine

## 2022-12-16 ENCOUNTER — Ambulatory Visit (HOSPITAL_COMMUNITY)
Admission: RE | Admit: 2022-12-16 | Discharge: 2022-12-16 | Disposition: A | Discharge status: Home / Self Care | Payer: Medicare Other | Source: Ambulatory Visit | Attending: Physician Assistant | Admitting: Physician Assistant

## 2022-12-16 VITALS — BP 128/66 | HR 62 | Ht 74.0 in | Wt 212.2 lb

## 2022-12-16 DIAGNOSIS — Z79899 Other long term (current) drug therapy: Secondary | ICD-10-CM | POA: Diagnosis not present

## 2022-12-16 DIAGNOSIS — E039 Hypothyroidism, unspecified: Secondary | ICD-10-CM | POA: Diagnosis not present

## 2022-12-16 DIAGNOSIS — Z7901 Long term (current) use of anticoagulants: Secondary | ICD-10-CM | POA: Insufficient documentation

## 2022-12-16 DIAGNOSIS — I4819 Other persistent atrial fibrillation: Secondary | ICD-10-CM | POA: Diagnosis present

## 2022-12-16 DIAGNOSIS — Z87891 Personal history of nicotine dependence: Secondary | ICD-10-CM | POA: Diagnosis not present

## 2022-12-16 DIAGNOSIS — E785 Hyperlipidemia, unspecified: Secondary | ICD-10-CM | POA: Diagnosis not present

## 2022-12-16 DIAGNOSIS — G4733 Obstructive sleep apnea (adult) (pediatric): Secondary | ICD-10-CM | POA: Insufficient documentation

## 2022-12-16 DIAGNOSIS — Z8673 Personal history of transient ischemic attack (TIA), and cerebral infarction without residual deficits: Secondary | ICD-10-CM | POA: Diagnosis not present

## 2022-12-16 DIAGNOSIS — I080 Rheumatic disorders of both mitral and aortic valves: Secondary | ICD-10-CM | POA: Insufficient documentation

## 2022-12-16 DIAGNOSIS — D6869 Other thrombophilia: Secondary | ICD-10-CM | POA: Diagnosis not present

## 2022-12-16 DIAGNOSIS — I10 Essential (primary) hypertension: Secondary | ICD-10-CM | POA: Diagnosis not present

## 2022-12-16 NOTE — Progress Notes (Signed)
Primary Care Physician: Bradd Canary, MD Primary Cardiologist: Dr Eden Emms Primary Electrophysiologist: none Referring Physician: Dr Darrelyn Hillock is a 88 y.o. male with a history of HTN, tobacco abuse quit 2011, CVA 2011, HLD, hypothyroidism, OSA, and persistent atrial fibrillation who presents for follow up in the [facility]. The patient was initially diagnosed with atrial fibrillation 05/2019 at his PCP office after noting irregular readings on his FitBit. He is otherwise asymptomatic and rate controlled. Patient is on Xarelto for a CHADS2VASC score of 6. Patient is s/p DCCV on 08/11/19. Patient presented to the ED  10/26/21 feeling lightheaded and weak. His smart watch showed afib. ECG at the ED showed reasonably rate controlled afib and DCCV was not pursued at that time. S/p DCCV on 12/05/21.   On follow up today, patient reports that he has done well since his last visit. He has not had any symptoms of atrial fibrillation. His smart watch has shown only SR. No bleeding issues on anticoagulation.   Today, he denies symptoms of palpitations, chest pain, shortness of breath, orthopnea, PND, presyncope, syncope, bleeding, or neurologic sequela. The patient is tolerating medications without difficulties and is otherwise without complaint today.    Atrial Fibrillation Risk Factors:  he does have symptoms or diagnosis of sleep apnea. he is compliant with oral device therapy. he does not have a history of rheumatic fever. he does have a history of alcohol use. The patient does not have a history of early familial atrial fibrillation or other arrhythmias.  he has a BMI of Body mass index is 27.24 kg/m.[name] Filed Weights   12/16/22 0831  Weight: 96.3 kg   Family History  Problem Relation Age of Onset   Alzheimer's disease Mother    Emphysema Sister        cigarettes   Cancer Sister        lung, tobacco    Heart disease Son    Coronary artery  disease Brother    Heart attack Brother    Other Brother        heart problems- from fathers side   Cancer Brother        lung? smoker   Heart disease Brother        Heart disease before age 14   Cancer Sister        gyn   Heart disease Sister    Bipolar disorder Daughter    Obesity Daughter    Stroke Father    Heart disease Sister    Esophageal cancer Neg Hx    Colon cancer Neg Hx    Pancreatic cancer Neg Hx    Stomach cancer Neg Hx      Atrial Fibrillation Management history:  Previous antiarrhythmic drugs: none Previous cardioversions: 08/11/19, 12/05/21 Previous ablations: none Anticoagulation history: Xarelto   Past Medical History:  Diagnosis Date   Allergy    seasonal- mostly spring   Anemia 02/03/2011   ASTHMA, CHILDHOOD 03/17/2010   mostly as child   BCC (basal cell carcinoma) 12/08/2011   CAROTID ARTERY OCCLUSION, WITH INFARCTION 03/17/2010   CEREBROVASCULAR ACCIDENT 03/17/2010   partial loss of peripheral vision on left-"slight improved"   CHICKENPOX, HX OF 03/17/2010   Constipation 12/08/2014   Epistaxis 09/06/2013   FATIGUE 03/17/2010   Hearing loss 11/04/2010   bilateral   HYPERKALEMIA 05/27/2010   Hyperlipidemia    Hypertension    Hypothyroid 12/08/2011   Mixed hyperlipidemia 03/17/2010   Oral  lesion 11/20/2014   Overweight(278.02) 07/01/2010   Peripheral neuropathy 08/18/2016   Personal history of other infectious and parasitic disease 03/17/2010   Preventative health care 08/23/2016   Skin lesion of right arm 11/20/2014   Sleep apnea    uses mouthpiece only   Thrombocytopenia (HCC) 04/05/2012   TOBACCO ABUSE, HX OF 03/17/2010   Tubular adenoma of colon 06/10/2011   Past Surgical History:  Procedure Laterality Date   CARDIOVERSION N/A 08/11/2019   Procedure: CARDIOVERSION;  Surgeon: Wendall Stade, MD;  Location: [facility] ENDOSCOPY;  Service: Cardiovascular;  Laterality: N/A;   CARDIOVERSION N/A 12/05/2021   Procedure: CARDIOVERSION;  Surgeon: Wendall Stade, MD;   Location: [facility] ENDOSCOPY;  Service: Cardiovascular;  Laterality: N/A;   CAROTID ENDARTERECTOMY Left February 13, 2013   cea   CATARACT EXTRACTION, BILATERAL Right    Cataract, and stigmatism   COLONOSCOPY  Dec. 2014   ENDARTERECTOMY Left 02/13/2013   Procedure: ENDARTERECTOMY CAROTID;  Surgeon: Larina Earthly, MD;  Location: [facility] OR;  Service: Vascular;  Laterality: Left;   EUS N/A 08/17/2013   Procedure: UPPER ENDOSCOPIC ULTRASOUND (EUS) LINEAR;  Surgeon: Rachael Fee, MD;  Location: [facility] ENDOSCOPY;  Service: Endoscopy;  Laterality: N/A;   knee cartliage repair  1964, 1990   bilateral   PILONIDAL CYST EXCISION  1968   removed   ROTATOR CUFF REPAIR     right   TONSILLECTOMY     UPPER GI ENDOSCOPY  Jan. 2015    Current Outpatient Medications  Medication Sig Dispense Refill   acetaminophen (TYLENOL) 500 MG tablet Take 1,000 mg by mouth as needed for moderate pain or headache.      aspirin EC 81 MG tablet Take 81 mg by mouth every evening.      chlorthalidone (HYGROTON) 25 MG tablet TAKE 1 TABLET BY MOUTH EVERY  MORNING 90 tablet 1   cholecalciferol (VITAMIN D) 25 MCG (1000 UNIT) tablet Take 1,000 Units by mouth daily.     levothyroxine (SYNTHROID) 88 MCG tablet TAKE 1 TABLET BY MOUTH DAILY  BEFORE BREAKFAST 90 tablet 1   lubiprostone (AMITIZA) 24 MCG capsule TAKE 1 CAPSULE BY MOUTH TWICE  DAILY WITH A MEAL 180 capsule 3   metoprolol succinate (TOPROL-XL) 25 MG 24 hr tablet Take 0.5 tablets (12.5 mg total) by mouth daily. 90 tablet 1   Multiple Vitamins-Minerals (MULTIVITAMIN WITH MINERALS) tablet Take 1 tablet by mouth daily.     mupirocin ointment (BACTROBAN) 2 % Place 1 Application into the nose at bedtime as needed (via qtip). 22 g 01   pantoprazole (PROTONIX) 40 MG tablet TAKE 1 TABLET BY MOUTH  DAILY 90 tablet 3   Polyethyl Glycol-Propyl Glycol (SYSTANE ULTRA OP) Place 1 drop into both eyes in the morning.     ramipril (ALTACE) 10 MG capsule TAKE 1 CAPSULE BY MOUTH TWICE  DAILY 180 capsule  1   rosuvastatin (CRESTOR) 40 MG tablet TAKE 1 TABLET BY MOUTH IN  THE EVENING 90 tablet 3   TART CHERRY PO Take 1 tablet by mouth daily.     vitamin B-12 (CYANOCOBALAMIN) 1000 MCG tablet Take 2,000 mcg by mouth every Monday, Wednesday, and Friday.     XARELTO 20 MG TABS tablet TAKE 1 TABLET BY MOUTH DAILY  WITH SUPPER 90 tablet 3   No current facility-administered medications for this encounter.    No Known Allergies  Social History   Socioeconomic History   Marital status: Divorced    Spouse name: Not on file  Number of children: 3   Years of education: 84yrs   Highest education level: Bachelor's degree (e.g., BA, AB, BS)  Occupational History   Occupation: retired    Associate Professor: RETIRED  Tobacco Use   Smoking status: Former    Packs/day: 2.00    Years: 20.00    Additional pack years: 0.00    Total pack years: 40.00    Types: Cigarettes    Quit date: 07/19/1977    Years since quitting: 45.4    Passive exposure: Never   Smokeless tobacco: Never   Tobacco comments:    Former smoker 12/12/21  Vaping Use   Vaping Use: Never used  Substance and Sexual Activity   Alcohol use: Not Currently    Alcohol/week: 10.0 standard drinks of alcohol    Types: 7 Glasses of wine, 2 Cans of beer, 1 Shots of liquor per week   Drug use: No   Sexual activity: Not Currently  Other Topics Concern   Not on file  Social History Narrative   Pt lives at home alone.    Caffeine Use: very little   Heart healthy diet, is planning exercise      Retired from Patent examiner   Left handed   Social Determinants of Health   Financial Resource Strain: Low Risk  (09/03/2022)   Overall Financial Resource Strain (CARDIA)    Difficulty of Paying Living Expenses: Not hard at all  Food Insecurity: No Food Insecurity (09/03/2022)   Hunger Vital Sign    Worried About Running Out of Food in the Last Year: Never true    Ran Out of Food in the Last Year: Never true  Transportation Needs: No Transportation  Needs (09/03/2022)   PRAPARE - Administrator, Civil Service (Medical): No    Lack of Transportation (Non-Medical): No  Physical Activity: Insufficiently Active (09/03/2022)   Exercise Vital Sign    Days of Exercise per Week: 3 days    Minutes of Exercise per Session: 30 min  Stress: No Stress Concern Present (09/03/2022)   [employer] of Occupational Health - Occupational Stress Questionnaire    Feeling of Stress : Not at all  Social Connections: Moderately Integrated (09/03/2022)   Social Connection and Isolation Panel [NHANES]    Frequency of Communication with Friends and Family: More than three times a week    Frequency of Social Gatherings with Friends and Family: More than three times a week    Attends Religious Services: More than 4 times per year    Active Member of [employer] or Organizations: Yes    Attends Engineer, structural: More than 4 times per year    Marital Status: Divorced  Intimate Partner Violence: Not At Risk (09/03/2022)   Humiliation, Afraid, Rape, and Kick questionnaire    Fear of Current or Ex-Partner: No    Emotionally Abused: No    Physically Abused: No    Sexually Abused: No     ROS- All systems are reviewed and negative except as per the HPI above.  Physical Exam: Vitals:   12/16/22 0831  BP: 128/66  Pulse: 62  Weight: 96.3 kg  Height: 6\' 2"  (1.88 m)    GEN- The patient is a well appearing elderly male, alert and oriented x 3 today.   HEENT-head normocephalic, atraumatic, sclera clear, conjunctiva pink, hearing intact, trachea midline. Lungs- Clear to ausculation bilaterally, normal work of breathing Heart- Regular rate and rhythm, no murmurs, rubs or gallops  GI- soft, NT, ND, +  BS Extremities- no clubbing, cyanosis, or edema MS- no significant deformity or atrophy Skin- no rash or lesion Psych- euthymic mood, full affect Neuro- strength and sensation are intact   Wt Readings from Last 3 Encounters:  12/16/22 96.3 kg   08/28/22 98 kg  06/16/22 98.7 kg    EKG today demonstrates  SR, 1st degree AV block Vent. rate 62 BPM PR interval 206 ms QRS duration 90 ms QT/QTcB 400/406 ms   Echo 09/22/22 demonstrated   1. Left ventricular ejection fraction, by estimation, is 60 to 65%. The  left ventricle has normal function. The left ventricle has no regional  wall motion abnormalities. Left ventricular diastolic parameters were  normal. The average left ventricular global longitudinal strain is -20.6 %. The global longitudinal strain is normal.   2. Right ventricular systolic function is normal. The right ventricular  size is normal. There is normal pulmonary artery systolic pressure.   3. Left atrial size was moderately dilated.   4. The mitral valve is degenerative. Mild to moderate mitral valve  regurgitation.   5. The aortic valve is tricuspid. There is moderate calcification of the  aortic valve. There is mild thickening of the aortic valve. Aortic valve  regurgitation is trivial. Mild aortic valve stenosis.   Comparison(s): No significant change from prior study. Prior images  reviewed side by side.   Epic records are reviewed at length today  CHA2DS2-VASc Score = 6  The patient's score is based upon: CHF History: 0 HTN History: 1 Diabetes History: 0 Stroke History: 2 Vascular Disease History: 1 Age Score: 2 Gender Score: 0        ASSESSMENT AND PLAN: 1. Persistent Atrial Fibrillation (ICD10:  I48.19) The patient's CHA2DS2-VASc score is 6, indicating a 9.7% annual risk of stroke.   Patient appears to be maintaining SR. Continue Toprol 12.5 mg daily Continue Xarelto 20 mg daily.  Continue ASA per Dr Eden Emms for carotid disease.   2. Secondary Hypercoagulable State (ICD10:  D68.69) The patient is at significant risk for stroke/thromboembolism based upon his CHA2DS2-VASc Score of 6.  Continue Rivaroxaban (Xarelto).   3. Obstructive sleep apnea Followed by Dr Wynona Neat. Encouraged  compliance with oral device.  4. HTN Initially elevated, better on recheck Patient will check more frequently with home BP machine.   5. Valvular heart disease Mild AS Mild to moderate MR Followed by Dr Eden Emms with serial echos.   Follow up with Dr Eden Emms per recall. AF clinic in one year.    Jorja Loa PA-C Afib Clinic [facility] [address] [place], [place] [address] [phone] 12/16/2022 9:02 AM

## 2023-01-12 ENCOUNTER — Encounter: Payer: Self-pay | Admitting: Cardiovascular Disease

## 2023-01-13 ENCOUNTER — Ambulatory Visit (HOSPITAL_COMMUNITY)
Admission: RE | Admit: 2023-01-13 | Discharge: 2023-01-13 | Disposition: A | Discharge status: Home / Self Care | Payer: Medicare Other | Source: Ambulatory Visit | Attending: Physician Assistant | Admitting: Physician Assistant

## 2023-01-13 ENCOUNTER — Encounter (HOSPITAL_COMMUNITY): Payer: Self-pay | Admitting: Physician Assistant

## 2023-01-13 VITALS — BP 134/74 | HR 80 | Ht 74.0 in | Wt 212.0 lb

## 2023-01-13 DIAGNOSIS — G4733 Obstructive sleep apnea (adult) (pediatric): Secondary | ICD-10-CM | POA: Diagnosis not present

## 2023-01-13 DIAGNOSIS — R5383 Other fatigue: Secondary | ICD-10-CM | POA: Insufficient documentation

## 2023-01-13 DIAGNOSIS — Z7901 Long term (current) use of anticoagulants: Secondary | ICD-10-CM | POA: Diagnosis not present

## 2023-01-13 DIAGNOSIS — I4819 Other persistent atrial fibrillation: Secondary | ICD-10-CM | POA: Diagnosis present

## 2023-01-13 DIAGNOSIS — E039 Hypothyroidism, unspecified: Secondary | ICD-10-CM | POA: Diagnosis not present

## 2023-01-13 DIAGNOSIS — I10 Essential (primary) hypertension: Secondary | ICD-10-CM | POA: Diagnosis not present

## 2023-01-13 DIAGNOSIS — D6869 Other thrombophilia: Secondary | ICD-10-CM | POA: Insufficient documentation

## 2023-01-13 DIAGNOSIS — I080 Rheumatic disorders of both mitral and aortic valves: Secondary | ICD-10-CM | POA: Insufficient documentation

## 2023-01-13 DIAGNOSIS — I779 Disorder of arteries and arterioles, unspecified: Secondary | ICD-10-CM | POA: Diagnosis not present

## 2023-01-13 DIAGNOSIS — E785 Hyperlipidemia, unspecified: Secondary | ICD-10-CM | POA: Diagnosis not present

## 2023-01-13 LAB — BASIC METABOLIC PANEL
Anion gap: 9 (ref 5–15)
BUN: 20 mg/dL (ref 8–23)
CO2: 26 mmol/L (ref 22–32)
Calcium: 9.3 mg/dL (ref 8.9–10.3)
Chloride: 104 mmol/L (ref 98–111)
Creatinine, Ser: 1.41 mg/dL — ABNORMAL HIGH (ref 0.61–1.24)
GFR, Estimated: 49 mL/min — ABNORMAL LOW (ref >60)
Glucose, Bld: 109 mg/dL — ABNORMAL HIGH (ref 70–99)
Potassium: 4.5 mmol/L (ref 3.5–5.1)
Sodium: 139 mmol/L (ref 135–145)

## 2023-01-13 LAB — CBC
HCT: 41.3 % (ref 39.0–52.0)
Hemoglobin: 13.7 g/dL (ref 13.0–17.0)
MCH: 32.2 pg (ref 26.0–34.0)
MCHC: 33.2 g/dL (ref 30.0–36.0)
MCV: 96.9 fL (ref 80.0–100.0)
Platelets: 174 10*3/uL (ref 150–400)
RBC: 4.26 MIL/uL (ref 4.22–5.81)
RDW: 13.5 % (ref 11.5–15.5)
WBC: 7.6 10*3/uL (ref 4.0–10.5)
nRBC: 0 % (ref 0.0–0.2)

## 2023-01-13 LAB — TSH: TSH: 1.348 u[IU]/mL (ref 0.350–4.500)

## 2023-01-13 MED ORDER — METOPROLOL SUCCINATE ER 25 MG PO TB24: 12.5000 mg | ORAL_TABLET | Freq: Every day | ORAL | 1 refills | Status: DC

## 2023-01-13 NOTE — Patient Instructions (Addendum)
Day of cardioversion reduce metoprolol back to normal dosing 1/2 tablet a day  Cardioversion scheduled for: Friday, July 19th   - Arrive at the [facility] and go to admitting at 830am   - Do not eat or drink anything after midnight the night prior to your procedure.   - Take all your morning medication (except diabetic medications) with a sip of water prior to arrival.  - You will not be able to drive home after your procedure.    - Do NOT miss any doses of your blood thinner - if you should miss a dose please notify our office immediately.   - If you feel as if you go back into normal rhythm prior to scheduled cardioversion, please notify our office immediately.   If your procedure is canceled in the cardioversion suite you will be charged a cancellation fee.

## 2023-01-13 NOTE — Progress Notes (Addendum)
Primary Care Physician: Bradd Canary, MD Primary Cardiologist: Dr Eden Emms Primary Electrophysiologist: none Referring Physician: Dr Darrelyn Hillock is a 88 y.o. male with a history of HTN, tobacco abuse quit 2011, CVA 2011, HLD, hypothyroidism, OSA, and persistent atrial fibrillation who presents for follow up in the [facility]. The patient was initially diagnosed with atrial fibrillation 05/2019 at his PCP office after noting irregular readings on his FitBit. He is otherwise asymptomatic and rate controlled. Patient is on Xarelto for a CHADS2VASC score of 6. Patient is s/p DCCV on 08/11/19. Patient presented to the ED  10/26/21 feeling lightheaded and weak. His smart watch showed afib. ECG at the ED showed reasonably rate controlled afib and DCCV was not pursued at that time. S/p DCCV on 12/05/21.   On follow up today, patient reports that for the past 4-5 weeks he has felt more fatigued with exertion. He has also had intermittent lightheadedness. His Apple Watch alerted him that he was in afib 6/24 with elevated rates. His BB was increased. He is in rate controlled afib today.   Today, he denies symptoms of palpitations, chest pain, shortness of breath, orthopnea, PND, presyncope, syncope, bleeding, or neurologic sequela. The patient is tolerating medications without difficulties and is otherwise without complaint today.    Atrial Fibrillation Risk Factors:  he does have symptoms or diagnosis of sleep apnea. he is compliant with oral device therapy. he does not have a history of rheumatic fever. he does have a history of alcohol use. The patient does not have a history of early familial atrial fibrillation or other arrhythmias.   Atrial Fibrillation Management history:  Previous antiarrhythmic drugs: none Previous cardioversions: 08/11/19, 12/05/21 Previous ablations: none Anticoagulation history: Xarelto   Past Medical History:  Diagnosis  Date   Allergy    seasonal- mostly spring   Anemia 02/03/2011   ASTHMA, CHILDHOOD 03/17/2010   mostly as child   BCC (basal cell carcinoma) 12/08/2011   CAROTID ARTERY OCCLUSION, WITH INFARCTION 03/17/2010   CEREBROVASCULAR ACCIDENT 03/17/2010   partial loss of peripheral vision on left-"slight improved"   CHICKENPOX, HX OF 03/17/2010   Constipation 12/08/2014   Epistaxis 09/06/2013   FATIGUE 03/17/2010   Hearing loss 11/04/2010   bilateral   HYPERKALEMIA 05/27/2010   Hyperlipidemia    Hypertension    Hypothyroid 12/08/2011   Mixed hyperlipidemia 03/17/2010   Oral lesion 11/20/2014   Overweight(278.02) 07/01/2010   Peripheral neuropathy 08/18/2016   Personal history of other infectious and parasitic disease 03/17/2010   Preventative health care 08/23/2016   Skin lesion of right arm 11/20/2014   Sleep apnea    uses mouthpiece only   Thrombocytopenia (HCC) 04/05/2012   TOBACCO ABUSE, HX OF 03/17/2010   Tubular adenoma of colon 06/10/2011    ROS- All systems are reviewed and negative except as per the HPI above.  Physical Exam: Vitals:   01/13/23 0926  BP: 134/74  Pulse: 80  Weight: 96.2 kg  Height: 6\' 2"  (1.88 m)     GEN: Well nourished, well developed in no acute distress NECK: No JVD; No carotid bruits CARDIAC: Irregularly irregular rate and rhythm, no murmurs, rubs, gallops RESPIRATORY:  Clear to auscultation without rales, wheezing or rhonchi  ABDOMEN: Soft, non-tender, non-distended EXTREMITIES:  No edema; No deformity    Wt Readings from Last 3 Encounters:  01/13/23 96.2 kg  12/16/22 96.3 kg  08/28/22 98 kg    EKG today demonstrates  Afib HR  80 bpm QRS 88 ms QT/QTc 368/424 ms   Echo 09/22/22 demonstrated   1. Left ventricular ejection fraction, by estimation, is 60 to 65%. The  left ventricle has normal function. The left ventricle has no regional  wall motion abnormalities. Left ventricular diastolic parameters were  normal. The average left ventricular global  longitudinal strain is -20.6 %. The global longitudinal strain is normal.   2. Right ventricular systolic function is normal. The right ventricular  size is normal. There is normal pulmonary artery systolic pressure.   3. Left atrial size was moderately dilated.   4. The mitral valve is degenerative. Mild to moderate mitral valve  regurgitation.   5. The aortic valve is tricuspid. There is moderate calcification of the  aortic valve. There is mild thickening of the aortic valve. Aortic valve  regurgitation is trivial. Mild aortic valve stenosis.   Comparison(s): No significant change from prior study. Prior images  reviewed side by side.   Epic records are reviewed at length today  CHA2DS2-VASc Score = 6  The patient's score is based upon: CHF History: 0 HTN History: 1 Diabetes History: 0 Stroke History: 2 Vascular Disease History: 1 Age Score: 2 Gender Score: 0        ASSESSMENT AND PLAN: Persistent Atrial Fibrillation (ICD10:  I48.19) The patient's CHA2DS2-VASc score is 6, indicating a 9.7% annual risk of stroke.   Patient in rate controlled afib today. We discussed rhythm control options. Will plan for DCCV.  Check bmet/cbc/TSH today Continue Toprol 25 mg daily until DCCV then decrease back to 12.5 mg daily. Continue Xarelto 20 mg daily Continue ASA per Dr Eden Emms for carotid disease.   Secondary Hypercoagulable State (ICD10:  D68.69) The patient is at significant risk for stroke/thromboembolism based upon his CHA2DS2-VASc Score of 6.  Continue Rivaroxaban (Xarelto).   OSA  Encouraged nightly CPAP Has follow up with pulmonology on 7/2  HTN Stable on current regimen  Valvular heart disease Mild AS Mild to moderate MR Followed by Dr Eden Emms with serial echos.   Follow up in the AF clinic post DCCV.    Informed Consent   Shared Decision Making/Informed Consent The risks (stroke, cardiac arrhythmias rarely resulting in the need for a temporary or permanent  pacemaker, skin irritation or burns and complications associated with conscious sedation including aspiration, arrhythmia, respiratory failure and death), benefits (restoration of normal sinus rhythm) and alternatives of a direct current cardioversion were explained in detail to Tanner [name] and he agrees to proceed.         Tanner Loa PA-C Afib Clinic [facility] [address] [place], [place] [address] [phone] 01/13/2023 9:50 AM

## 2023-01-13 NOTE — Addendum Note (Signed)
Encounter addended by: Danice Goltz, PA on: 01/13/2023 4:59 PM  Actions taken: Clinical Note Signed

## 2023-01-18 ENCOUNTER — Encounter: Payer: Self-pay | Admitting: Family Medicine

## 2023-01-18 NOTE — Addendum Note (Signed)
Addended by: Thelma Barge D on: 01/18/2023 04:24 PM   Modules accepted: Orders

## 2023-01-19 ENCOUNTER — Encounter: Payer: Self-pay | Admitting: Primary Care

## 2023-01-19 ENCOUNTER — Ambulatory Visit (INDEPENDENT_AMBULATORY_CARE_PROVIDER_SITE_OTHER): Payer: Medicare Other | Admitting: Primary Care

## 2023-01-19 ENCOUNTER — Ambulatory Visit: Payer: Medicare Other | Admitting: Family Medicine

## 2023-01-19 ENCOUNTER — Encounter: Payer: Self-pay | Admitting: Family Medicine

## 2023-01-19 ENCOUNTER — Ambulatory Visit (HOSPITAL_BASED_OUTPATIENT_CLINIC_OR_DEPARTMENT_OTHER)
Admission: RE | Admit: 2023-01-19 | Discharge: 2023-01-19 | Disposition: A | Discharge status: Home / Self Care | Payer: Medicare Other | Source: Ambulatory Visit | Attending: Family Medicine | Admitting: Family Medicine

## 2023-01-19 VITALS — BP 149/80 | HR 76 | Ht 74.0 in | Wt 214.0 lb

## 2023-01-19 VITALS — BP 108/64 | HR 84 | Temp 97.9°F | Ht 74.0 in | Wt 213.2 lb

## 2023-01-19 DIAGNOSIS — R5383 Other fatigue: Secondary | ICD-10-CM | POA: Diagnosis not present

## 2023-01-19 DIAGNOSIS — I4819 Other persistent atrial fibrillation: Secondary | ICD-10-CM

## 2023-01-19 DIAGNOSIS — G4733 Obstructive sleep apnea (adult) (pediatric): Secondary | ICD-10-CM

## 2023-01-19 DIAGNOSIS — R052 Subacute cough: Secondary | ICD-10-CM | POA: Insufficient documentation

## 2023-01-19 DIAGNOSIS — R944 Abnormal results of kidney function studies: Secondary | ICD-10-CM

## 2023-01-19 LAB — COMPREHENSIVE METABOLIC PANEL
ALT: 10 U/L (ref 0–53)
AST: 13 U/L (ref 0–37)
Albumin: 4.2 g/dL (ref 3.5–5.2)
Alkaline Phosphatase: 69 U/L (ref 39–117)
BUN: 16 mg/dL (ref 6–23)
CO2: 28 mEq/L (ref 19–32)
Calcium: 9.5 mg/dL (ref 8.4–10.5)
Chloride: 100 mEq/L (ref 96–112)
Creatinine, Ser: 1.47 mg/dL (ref 0.40–1.50)
GFR: 43.07 mL/min — ABNORMAL LOW (ref >60.00)
Glucose, Bld: 93 mg/dL (ref 70–99)
Potassium: 4.3 mEq/L (ref 3.5–5.1)
Sodium: 137 mEq/L (ref 135–145)
Total Bilirubin: 0.6 mg/dL (ref 0.2–1.2)
Total Protein: 6.8 g/dL (ref 6.0–8.3)

## 2023-01-19 LAB — B12 AND FOLATE PANEL
Folate: >23.8 ng/mL (ref >5.9)
Vitamin B-12: 857 pg/mL (ref 211–911)

## 2023-01-19 NOTE — Assessment & Plan Note (Signed)
-   History of mild sleep apnea diagnosed in 2014.  Patient is 97% compliant with CPAP use greater than 4 hours over the last 30 days.  Current pressure 10-18 cm H2O (17.2cm h20-95%); Residual AHI 4.7.  He is having issues with dry mouth and air leaks. Currently in A-fib.  Despite apneas being <5/hour would recommend increasing max pressure setting to 20 cm H2O to see if we can improved AHI a little more. He needs to have mask fitting with DME and recommend heated humidity. Follow-up in 1 year of sooner.

## 2023-01-19 NOTE — Progress Notes (Signed)
Acute Office Visit  Subjective:     Patient ID: Tanner [name], male    DOB: Feb 01, 1935, 88 y.o.   MRN: [id number]  Chief Complaint  Patient presents with   Cough     Patient is in today for cough and fatigue.   Discussed the use of AI scribe software for clinical note transcription with the patient, who gave verbal consent to proceed.  History of Present Illness   The patient, with a history of hypertension and pulmonary disease, presents with a lingering cough and fatigue. The cough, which is different from their usual cough, is described as being stuck in the chest, leading to bouts of uncontrollable coughing. The cough is not associated with sputum production or hemoptysis. The patient denies any nocturnal coughing episodes. The fatigue predates the onset of the cough and has been ongoing for several weeks. The patient has noticed an increase in heart rate with minimal exertion, such as walking to the kitchen.  The patient also reports epistaxis upon blowing their nose, which they attribute to superficial veins. They have experienced unintentional weight loss, despite maintaining a good appetite. The patient has a history of smoking until 1978 and expresses concern about the possibility of lung cancer.  The patient is currently on Xarelto for atrial fibrillation and uses a CPAP machine, which they note may be contributing to dryness . They have not noticed any significant changes in their blood pressure, which has historically been high.              All review of systems negative except what is listed in the HPI      Objective:    BP (!) 149/80 Comment: home machine  Pulse 76   Ht 6\' 2"  (1.88 m)   Wt 214 lb (97.1 kg)   SpO2 100%   BMI 27.48 kg/m    Physical Exam Constitutional:      General: He is not in acute distress.    Appearance: Normal appearance. He is not ill-appearing.  HENT:     Head: Normocephalic and atraumatic.     Right Ear: External ear  normal.     Left Ear: External ear normal.  Eyes:     Extraocular Movements: Extraocular movements intact.     Pupils: Pupils are equal, round, and reactive to light.  Cardiovascular:     Rate and Rhythm: Rhythm irregular.     Heart sounds: Normal heart sounds.  Pulmonary:     Effort: Pulmonary effort is normal. No respiratory distress.     Breath sounds: Normal breath sounds. No wheezing, rhonchi or rales.  Musculoskeletal:     Cervical back: Normal range of motion.  Skin:    General: Skin is warm and dry.  Neurological:     Mental Status: He is alert and oriented to person, place, and time.  Psychiatric:        Judgment: Judgment normal.     No results found for any visits on 01/19/23.      Assessment & Plan:   Problem List Items Addressed This Visit     Cough   Relevant Orders   DG Chest 2 View   Other Visit Diagnoses     Fatigue, unspecified type    -  Primary   Relevant Orders   Comprehensive metabolic panel   W29 and Folate Panel     Chest xray and labs today for your ongoing cough and fatigue. Recent CBC and TSH stable. Continue Coricidin as  needed. Could try adding a daily antihistamine for a week or so to see if that will help with any drainage that may be triggering the cough.  Continue supportive measures including rest, hydration, humidifier use, steam showers, warm liquids with lemon and honey.  We will update you as results come in.   Please contact office for follow-up if symptoms do not improve or worsen. Seek emergency care if symptoms become severe.  No orders of the defined types were placed in this encounter.   Return if symptoms worsen or fail to improve.  Clayborne Dana, NP

## 2023-01-19 NOTE — Patient Instructions (Addendum)
Orders: - Change CPAP pressure auto settings 10-20cm h20  - Renew CPAP supplies- [facility] (needs mask fitting and heated humidity/tubing)   Follow-up: - 1 year with Dr. Wynona Neat

## 2023-01-19 NOTE — Progress Notes (Signed)
@Patient  ID: Tanner [name], male    DOB: 1935-05-12, 88 y.o.   MRN: [id number]  Chief Complaint  Patient presents with   Follow-up    Doing well.  CPAP follow up    Referring provider: Bradd Canary, MD  HPI: 88 year old former smoke, former smoker. PMH significant for afib, HTN, coronary artery disease, OSA, asthma.   01/19/2023 Patient has mild sleep apnea, dx with 2014. Using CPAP nightly approximately 7-8 hours a night. Current pressure 10-18cm h20. He is waking up with dry mouth. He is experiencing a large amount of air leaks. Average AHI 4.7/hour. He does not feel rested when he wakes up in the morning. He is currently in afib, he is scheduled for cardioversion on July 19th.  He has a productive cough, he was sick about a week ago and symptoms improved but returned again recently. He is getting up colored mucus. He has some shortness of breath with exertion. He is scheduled to see his PCP today. Former smoker. CXR in April 2023 showed mild bronchitic changes.   Airview download 12/19/22-01/17/23 Usage 30/30 days; 97% days > 4hours Average usage 7 hours 36 mins Pressure 10-18cm h20 (17.2h20-95%) Airleaks 40L/min AHI 4.7   No Known Allergies  Immunization History  Administered Date(s) Administered   Fluad Quad(high Dose 65+) 04/25/2019, 04/11/2020, 04/17/2021, 04/07/2022   Influenza Split 04/05/2012   Influenza Whole 04/19/2009, 05/06/2011   Influenza, High Dose Seasonal PF 05/09/2013, 04/13/2016, 04/16/2017, 04/05/2018   Influenza-Unspecified 03/29/2014, 05/07/2015, 04/07/2022   Moderna Covid-19 Vaccine Bivalent Booster 86yrs & up 04/16/2021, 01/14/2022, 04/14/2022   Moderna SARS-COV2 Booster Vaccination 05/13/2020, 11/04/2020   Moderna Sars-Covid-2 Vaccination 03/13/2019, 04/10/2019   Pneumococcal Conjugate-13 05/17/2014   Pneumococcal Polysaccharide-23 07/21/2007, 04/05/2018   Respiratory Syncytial Virus Vaccine,Recomb Aduvanted(Arexvy) 04/22/2022   Td 07/21/2003    Tdap 06/10/2011   Zoster Recombinant(Shingrix) 04/27/2017, 07/04/2017   Zoster, Live 07/21/2007    Past Medical History:  Diagnosis Date   Allergy    seasonal- mostly spring   Anemia 02/03/2011   ASTHMA, CHILDHOOD 03/17/2010   mostly as child   BCC (basal cell carcinoma) 12/08/2011   CAROTID ARTERY OCCLUSION, WITH INFARCTION 03/17/2010   CEREBROVASCULAR ACCIDENT 03/17/2010   partial loss of peripheral vision on left-"slight improved"   CHICKENPOX, HX OF 03/17/2010   Constipation 12/08/2014   Epistaxis 09/06/2013   FATIGUE 03/17/2010   Hearing loss 11/04/2010   bilateral   HYPERKALEMIA 05/27/2010   Hyperlipidemia    Hypertension    Hypothyroid 12/08/2011   Mixed hyperlipidemia 03/17/2010   Oral lesion 11/20/2014   Overweight(278.02) 07/01/2010   Peripheral neuropathy 08/18/2016   Personal history of other infectious and parasitic disease 03/17/2010   Preventative health care 08/23/2016   Skin lesion of right arm 11/20/2014   Sleep apnea    uses mouthpiece only   Thrombocytopenia (HCC) 04/05/2012   TOBACCO ABUSE, HX OF 03/17/2010   Tubular adenoma of colon 06/10/2011    Tobacco History: Social History   Tobacco Use  Smoking Status Former   Packs/day: 2.00   Years: 20.00   Additional pack years: 0.00   Total pack years: 40.00   Types: Cigarettes   Quit date: 07/19/1977   Years since quitting: 45.5   Passive exposure: Never  Smokeless Tobacco Never  Tobacco Comments   Former smoker 12/12/21   Counseling given: Not Answered Tobacco comments: Former smoker 12/12/21   Outpatient Medications Prior to Visit  Medication Sig Dispense Refill   acetaminophen (TYLENOL) 500 MG  tablet Take 1,000 mg by mouth as needed for moderate pain or headache.      aspirin EC 81 MG tablet Take 81 mg by mouth every evening.      chlorthalidone (HYGROTON) 25 MG tablet TAKE 1 TABLET BY MOUTH EVERY  MORNING 90 tablet 1   cholecalciferol (VITAMIN D) 25 MCG (1000 UNIT) tablet Take 1,000 Units by mouth daily.      levothyroxine (SYNTHROID) 88 MCG tablet TAKE 1 TABLET BY MOUTH DAILY  BEFORE BREAKFAST 90 tablet 1   lubiprostone (AMITIZA) 24 MCG capsule TAKE 1 CAPSULE BY MOUTH TWICE  DAILY WITH A MEAL 180 capsule 3   metoprolol succinate (TOPROL-XL) 25 MG 24 hr tablet Take 0.5 tablets (12.5 mg total) by mouth daily. May extra 1/2 tablet daily for breakthrough afib 180 tablet 1   Multiple Vitamins-Minerals (MULTIVITAMIN WITH MINERALS) tablet Take 1 tablet by mouth daily.     mupirocin ointment (BACTROBAN) 2 % Place 1 Application into the nose at bedtime as needed (via qtip). 22 g 01   pantoprazole (PROTONIX) 40 MG tablet TAKE 1 TABLET BY MOUTH  DAILY 90 tablet 3   Polyethyl Glycol-Propyl Glycol (SYSTANE ULTRA OP) Place 1 drop into both eyes in the morning.     ramipril (ALTACE) 10 MG capsule TAKE 1 CAPSULE BY MOUTH TWICE  DAILY 180 capsule 1   rosuvastatin (CRESTOR) 40 MG tablet TAKE 1 TABLET BY MOUTH IN  THE EVENING 90 tablet 3   TART CHERRY PO Take 1 tablet by mouth daily.     vitamin B-12 (CYANOCOBALAMIN) 1000 MCG tablet Take 2,000 mcg by mouth every Monday, Wednesday, and Friday.     XARELTO 20 MG TABS tablet TAKE 1 TABLET BY MOUTH DAILY  WITH SUPPER 90 tablet 3   No facility-administered medications prior to visit.   Review of Systems  Review of Systems  Constitutional:  Positive for fatigue.  HENT:  Positive for congestion.   Respiratory:  Positive for cough.   Cardiovascular: Negative.    Physical Exam  BP 108/64 (BP Location: Left Arm, Patient Position: Sitting, Cuff Size: Normal)   Pulse 84   Temp 97.9 F (36.6 C) (Oral)   Ht 6\' 2"  (1.88 m)   Wt 213 lb 3.2 oz (96.7 kg)   SpO2 98%   BMI 27.37 kg/m  Physical Exam Constitutional:      Appearance: Normal appearance.  HENT:     Head: Normocephalic and atraumatic.     Mouth/Throat:     Mouth: Mucous membranes are moist.     Pharynx: Oropharynx is clear.  Cardiovascular:     Rate and Rhythm: Normal rate and regular rhythm.   Pulmonary:     Effort: Pulmonary effort is normal.     Breath sounds: Normal breath sounds.  Skin:    General: Skin is warm and dry.  Neurological:     General: No focal deficit present.     Mental Status: He is alert and oriented to person, place, and time. Mental status is at baseline.  Psychiatric:        Mood and Affect: Mood normal.        Behavior: Behavior normal.        Thought Content: Thought content normal.        Judgment: Judgment normal.      Lab Results:  CBC    Component Value Date/Time   WBC 7.6 01/13/2023 1025   RBC 4.26 01/13/2023 1025   HGB 13.7 01/13/2023 1025  HGB 13.8 08/21/2020 0824   HGB 14.6 07/10/2019 0935   HCT 41.3 01/13/2023 1025   HCT 44.1 07/10/2019 0935   PLT 174 01/13/2023 1025   PLT 164 08/21/2020 0824   PLT CANCELED 07/10/2019 0935   MCV 96.9 01/13/2023 1025   MCV 94 07/10/2019 0935   MCH 32.2 01/13/2023 1025   MCHC 33.2 01/13/2023 1025   RDW 13.5 01/13/2023 1025   RDW 13.0 07/10/2019 0935   LYMPHSABS 1.6 05/21/2022 0924   LYMPHSABS 1.7 07/10/2019 0935   MONOABS 0.6 05/21/2022 0924   EOSABS 0.2 05/21/2022 0924   EOSABS 0.3 07/10/2019 0935   BASOSABS 0.0 05/21/2022 0924   BASOSABS 0.1 07/10/2019 0935    BMET    Component Value Date/Time   NA 137 01/19/2023 1159   NA 140 07/10/2019 0935   K 4.3 01/19/2023 1159   CL 100 01/19/2023 1159   CO2 28 01/19/2023 1159   GLUCOSE 93 01/19/2023 1159   BUN 16 01/19/2023 1159   BUN 16 07/10/2019 0935   CREATININE 1.47 01/19/2023 1159   CREATININE 1.27 (H) 08/21/2020 0824   CREATININE 1.03 08/14/2013 1532   CALCIUM 9.5 01/19/2023 1159   GFRNONAA 49 (L) 01/13/2023 1025   GFRNONAA 56 (L) 08/21/2020 0824   GFRAA >60 08/02/2019 0847    BNP No results found for: "BNP"  ProBNP    Component Value Date/Time   PROBNP 46.0 03/29/2018 0809    Imaging: No results found.   Assessment & Plan:   OSA (obstructive sleep apnea) - History of mild sleep apnea diagnosed in 2014.   Patient is 97% compliant with CPAP use greater than 4 hours over the last 30 days.  Current pressure 10-18 cm H2O (17.2cm h20-95%); Residual AHI 4.7.  He is having issues with dry mouth and air leaks. Currently in A-fib.  Despite apneas being <5/hour would recommend increasing max pressure setting to 20 cm H2O to see if we can improved AHI a little more. He needs to have mask fitting with DME and recommend heated humidity. Follow-up in 1 year of sooner.   Persistent atrial fibrillation (HCC) - Rate is currently well-controlled, he is scheduled for cardioversion on July 19th with cardiology.    Glenford Bayley, NP 01/19/2023

## 2023-01-19 NOTE — Patient Instructions (Signed)
Chest xray and labs today for your ongoing cough and fatigue Continue Coricidin as needed. Could try adding a daily antihistamine for a week or so to see if that will help with any drainage that may be triggering the cough.  Continue supportive measures including rest, hydration, humidifier use, steam showers, warm liquids with lemon and honey.  We will update you as results come in.   Please contact office for follow-up if symptoms do not improve or worsen. Seek emergency care if symptoms become severe.

## 2023-01-19 NOTE — Assessment & Plan Note (Signed)
-   Rate is currently well-controlled, he is scheduled for cardioversion on July 19th with cardiology.

## 2023-01-20 NOTE — Telephone Encounter (Signed)
Per Apolonio Schneiders he was seen by Hyman Hopes

## 2023-01-20 NOTE — Addendum Note (Signed)
Addended by: Hyman Hopes B on: 01/20/2023 08:21 AM   Modules accepted: Orders

## 2023-02-03 ENCOUNTER — Telehealth (HOSPITAL_COMMUNITY): Payer: Self-pay | Admitting: *Deleted

## 2023-02-03 NOTE — Telephone Encounter (Signed)
Patient called in stating he converted back into normal rhythm this morning HR 58 confirmed by apple watch. Feeling great. DCCV canceled. Will return to normal dosing of metoprolol.

## 2023-02-05 ENCOUNTER — Encounter (HOSPITAL_COMMUNITY): Admission: RE | Payer: Self-pay | Source: Home / Self Care

## 2023-02-05 ENCOUNTER — Ambulatory Visit (HOSPITAL_COMMUNITY): Admission: RE | Admit: 2023-02-05 | Payer: Medicare Other | Source: Home / Self Care | Admitting: Cardiovascular Disease

## 2023-02-05 SURGERY — CARDIOVERSION
Anesthesia: General

## 2023-02-17 ENCOUNTER — Encounter (INDEPENDENT_AMBULATORY_CARE_PROVIDER_SITE_OTHER): Payer: Self-pay

## 2023-02-17 ENCOUNTER — Encounter (HOSPITAL_COMMUNITY): Payer: Self-pay | Admitting: Physician Assistant

## 2023-02-17 ENCOUNTER — Ambulatory Visit (HOSPITAL_COMMUNITY)
Admission: RE | Admit: 2023-02-17 | Discharge: 2023-02-17 | Disposition: A | Discharge status: Home / Self Care | Payer: Medicare Other | Source: Ambulatory Visit | Attending: Physician Assistant | Admitting: Physician Assistant

## 2023-02-17 VITALS — BP 142/88 | HR 56 | Ht 74.0 in | Wt 209.0 lb

## 2023-02-17 DIAGNOSIS — I4819 Other persistent atrial fibrillation: Secondary | ICD-10-CM | POA: Insufficient documentation

## 2023-02-17 DIAGNOSIS — I119 Hypertensive heart disease without heart failure: Secondary | ICD-10-CM | POA: Insufficient documentation

## 2023-02-17 DIAGNOSIS — Z8673 Personal history of transient ischemic attack (TIA), and cerebral infarction without residual deficits: Secondary | ICD-10-CM | POA: Insufficient documentation

## 2023-02-17 DIAGNOSIS — Z7901 Long term (current) use of anticoagulants: Secondary | ICD-10-CM | POA: Diagnosis not present

## 2023-02-17 DIAGNOSIS — R001 Bradycardia, unspecified: Secondary | ICD-10-CM | POA: Insufficient documentation

## 2023-02-17 DIAGNOSIS — Z87891 Personal history of nicotine dependence: Secondary | ICD-10-CM | POA: Insufficient documentation

## 2023-02-17 DIAGNOSIS — D6869 Other thrombophilia: Secondary | ICD-10-CM | POA: Diagnosis not present

## 2023-02-17 DIAGNOSIS — E785 Hyperlipidemia, unspecified: Secondary | ICD-10-CM | POA: Diagnosis not present

## 2023-02-17 DIAGNOSIS — I440 Atrioventricular block, first degree: Secondary | ICD-10-CM | POA: Diagnosis not present

## 2023-02-17 DIAGNOSIS — I779 Disorder of arteries and arterioles, unspecified: Secondary | ICD-10-CM | POA: Insufficient documentation

## 2023-02-17 DIAGNOSIS — Z7982 Long term (current) use of aspirin: Secondary | ICD-10-CM | POA: Diagnosis not present

## 2023-02-17 DIAGNOSIS — G4733 Obstructive sleep apnea (adult) (pediatric): Secondary | ICD-10-CM | POA: Diagnosis not present

## 2023-02-17 DIAGNOSIS — E039 Hypothyroidism, unspecified: Secondary | ICD-10-CM | POA: Diagnosis not present

## 2023-02-17 NOTE — Progress Notes (Signed)
Primary Care Physician: Bradd Canary, MD Primary Cardiologist: Dr Eden Emms Primary Electrophysiologist: none Referring Physician: Dr Darrelyn Hillock is a 88 y.o. male with a history of HTN, tobacco abuse quit 2011, CVA 2011, HLD, hypothyroidism, OSA, and persistent atrial fibrillation who presents for follow up in the [facility]. The patient was initially diagnosed with atrial fibrillation 05/2019 at his PCP office after noting irregular readings on his FitBit. He is otherwise asymptomatic and rate controlled. Patient is on Xarelto for a CHADS2VASC score of 6. Patient is s/p DCCV on 08/11/19. Patient presented to the ED  10/26/21 feeling lightheaded and weak. His smart watch showed afib. ECG at the ED showed reasonably rate controlled afib and DCCV was not pursued at that time. S/p DCCV on 12/05/21.   On follow up today, patient was set up for DCCV on 7/19 but he spontaneously converted to SR prior, procedure was cancelled. Patient has remained in SR since that time with no interim symptoms. No bleeding issues on anticoagulation.   Today, he denies symptoms of palpitations, chest pain, shortness of breath, orthopnea, PND, presyncope, syncope, bleeding, or neurologic sequela. The patient is tolerating medications without difficulties and is otherwise without complaint today.    Atrial Fibrillation Risk Factors:  he does have symptoms or diagnosis of sleep apnea. he is compliant with oral device therapy. he does not have a history of rheumatic fever. he does have a history of alcohol use. The patient does not have a history of early familial atrial fibrillation or other arrhythmias.   Atrial Fibrillation Management history:  Previous antiarrhythmic drugs: none Previous cardioversions: 08/11/19, 12/05/21 Previous ablations: none Anticoagulation history: Xarelto   Past Medical History:  Diagnosis Date   Allergy    seasonal- mostly spring   Anemia  02/03/2011   ASTHMA, CHILDHOOD 03/17/2010   mostly as child   BCC (basal cell carcinoma) 12/08/2011   CAROTID ARTERY OCCLUSION, WITH INFARCTION 03/17/2010   CEREBROVASCULAR ACCIDENT 03/17/2010   partial loss of peripheral vision on left-"slight improved"   CHICKENPOX, HX OF 03/17/2010   Constipation 12/08/2014   Epistaxis 09/06/2013   FATIGUE 03/17/2010   Hearing loss 11/04/2010   bilateral   HYPERKALEMIA 05/27/2010   Hyperlipidemia    Hypertension    Hypothyroid 12/08/2011   Mixed hyperlipidemia 03/17/2010   Oral lesion 11/20/2014   Overweight(278.02) 07/01/2010   Peripheral neuropathy 08/18/2016   Personal history of other infectious and parasitic disease 03/17/2010   Preventative health care 08/23/2016   Skin lesion of right arm 11/20/2014   Sleep apnea    uses mouthpiece only   Thrombocytopenia (HCC) 04/05/2012   TOBACCO ABUSE, HX OF 03/17/2010   Tubular adenoma of colon 06/10/2011    ROS- All systems are reviewed and negative except as per the HPI above.  Physical Exam: Vitals:   02/17/23 0948  BP: (!) 142/88  Pulse: (!) 56  Weight: 94.8 kg  Height: 6\' 2"  (1.88 m)    GEN: Well nourished, well developed in no acute distress NECK: No JVD; No carotid bruits CARDIAC: Regular rate and rhythm, no murmurs, rubs, gallops RESPIRATORY:  Clear to auscultation without rales, wheezing or rhonchi  ABDOMEN: Soft, non-tender, non-distended EXTREMITIES:  No edema; No deformity    Wt Readings from Last 3 Encounters:  02/17/23 94.8 kg  01/19/23 97.1 kg  01/19/23 96.7 kg    EKG today demonstrates  SB, 1st degree AV block Vent. rate 56 BPM PR interval 208 ms  QRS duration 90 ms QT/QTcB 422/407 ms   Echo 09/22/22 demonstrated   1. Left ventricular ejection fraction, by estimation, is 60 to 65%. The  left ventricle has normal function. The left ventricle has no regional  wall motion abnormalities. Left ventricular diastolic parameters were  normal. The average left ventricular global  longitudinal strain is -20.6 %. The global longitudinal strain is normal.   2. Right ventricular systolic function is normal. The right ventricular  size is normal. There is normal pulmonary artery systolic pressure.   3. Left atrial size was moderately dilated.   4. The mitral valve is degenerative. Mild to moderate mitral valve  regurgitation.   5. The aortic valve is tricuspid. There is moderate calcification of the  aortic valve. There is mild thickening of the aortic valve. Aortic valve  regurgitation is trivial. Mild aortic valve stenosis.   Comparison(s): No significant change from prior study. Prior images  reviewed side by side.   Epic records are reviewed at length today  CHA2DS2-VASc Score = 6  The patient's score is based upon: CHF History: 0 HTN History: 1 Diabetes History: 0 Stroke History: 2 Vascular Disease History: 1 Age Score: 2 Gender Score: 0      ASSESSMENT AND PLAN: Persistent Atrial Fibrillation (ICD10:  I48.19) The patient's CHA2DS2-VASc score is 6, indicating a 9.7% annual risk of stroke.   Patient appears to be maintaining SR Continue Toprol 12.5 mg daily. Continue Xarelto 20 mg daily Continue ASA per Dr Eden Emms for carotid disease Apple Watch for home monitoring   Secondary Hypercoagulable State (ICD10:  6804852758) The patient is at significant risk for stroke/thromboembolism based upon his CHA2DS2-VASc Score of 6.  Continue Rivaroxaban (Xarelto).   OSA  Encouraged nightly CPAP Followed by [facility]   HTN Stable on current regimen  Valvular heart disease Mild AS Mild to moderate MR Followed by Dr Eden Emms with serial echos.   Follow up with Dr Eden Emms per recall. AF clinic in one year.     Jorja Loa PA-C [facility] [address] [place], [place] [address] [phone] 02/17/2023 11:24 AM

## 2023-03-06 ENCOUNTER — Other Ambulatory Visit: Payer: Self-pay | Admitting: Family Medicine

## 2023-03-06 ENCOUNTER — Other Ambulatory Visit: Payer: Self-pay | Admitting: Cardiovascular Disease

## 2023-03-06 DIAGNOSIS — I4819 Other persistent atrial fibrillation: Secondary | ICD-10-CM

## 2023-03-08 ENCOUNTER — Other Ambulatory Visit (INDEPENDENT_AMBULATORY_CARE_PROVIDER_SITE_OTHER): Payer: Medicare Other

## 2023-03-08 DIAGNOSIS — R351 Nocturia: Secondary | ICD-10-CM

## 2023-03-08 DIAGNOSIS — D696 Thrombocytopenia, unspecified: Secondary | ICD-10-CM

## 2023-03-08 DIAGNOSIS — E782 Mixed hyperlipidemia: Secondary | ICD-10-CM | POA: Diagnosis not present

## 2023-03-08 DIAGNOSIS — R944 Abnormal results of kidney function studies: Secondary | ICD-10-CM

## 2023-03-08 DIAGNOSIS — R739 Hyperglycemia, unspecified: Secondary | ICD-10-CM | POA: Diagnosis not present

## 2023-03-08 DIAGNOSIS — I10 Essential (primary) hypertension: Secondary | ICD-10-CM

## 2023-03-08 LAB — CBC WITH DIFFERENTIAL/PLATELET
Basophils Absolute: 0.1 10*3/uL (ref 0.0–0.1)
Basophils Relative: 1.3 % (ref 0.0–3.0)
Eosinophils Absolute: 0.4 10*3/uL (ref 0.0–0.7)
Eosinophils Relative: 6.9 % — ABNORMAL HIGH (ref 0.0–5.0)
HCT: 40.4 % (ref 39.0–52.0)
Hemoglobin: 13.2 g/dL (ref 13.0–17.0)
Lymphocytes Relative: 25.5 % (ref 12.0–46.0)
Lymphs Abs: 1.5 10*3/uL (ref 0.7–4.0)
MCHC: 32.7 g/dL (ref 30.0–36.0)
MCV: 94.3 fl (ref 78.0–100.0)
Monocytes Absolute: 0.6 10*3/uL (ref 0.1–1.0)
Monocytes Relative: 10.4 % (ref 3.0–12.0)
Neutro Abs: 3.3 10*3/uL (ref 1.4–7.7)
Neutrophils Relative %: 55.9 % (ref 43.0–77.0)
Platelets: 180 10*3/uL (ref 150.0–400.0)
RBC: 4.28 Mil/uL (ref 4.22–5.81)
RDW: 14.7 % (ref 11.5–15.5)
WBC: 5.9 10*3/uL (ref 4.0–10.5)

## 2023-03-08 LAB — LIPID PANEL
Cholesterol: 196 mg/dL (ref 0–200)
HDL: 53.2 mg/dL (ref >39.00)
LDL Cholesterol: 123 mg/dL — ABNORMAL HIGH (ref 0–99)
NonHDL: 142.4
Total CHOL/HDL Ratio: 4
Triglycerides: 97 mg/dL (ref 0.0–149.0)
VLDL: 19.4 mg/dL (ref 0.0–40.0)

## 2023-03-08 LAB — COMPREHENSIVE METABOLIC PANEL
ALT: 13 U/L (ref 0–53)
AST: 13 U/L (ref 0–37)
Albumin: 4.5 g/dL (ref 3.5–5.2)
Alkaline Phosphatase: 81 U/L (ref 39–117)
BUN: 21 mg/dL (ref 6–23)
CO2: 29 mEq/L (ref 19–32)
Calcium: 9.7 mg/dL (ref 8.4–10.5)
Chloride: 104 mEq/L (ref 96–112)
Creatinine, Ser: 1.17 mg/dL (ref 0.40–1.50)
GFR: 56.59 mL/min — ABNORMAL LOW (ref >60.00)
Glucose, Bld: 109 mg/dL — ABNORMAL HIGH (ref 70–99)
Potassium: 5 mEq/L (ref 3.5–5.1)
Sodium: 142 mEq/L (ref 135–145)
Total Bilirubin: 0.9 mg/dL (ref 0.2–1.2)
Total Protein: 7.2 g/dL (ref 6.0–8.3)

## 2023-03-08 LAB — HEMOGLOBIN A1C: Hgb A1c MFr Bld: 5.8 % (ref 4.6–6.5)

## 2023-03-08 LAB — PSA: PSA: 0.48 ng/mL (ref 0.10–4.00)

## 2023-03-08 LAB — TSH: TSH: 1.68 u[IU]/mL (ref 0.35–5.50)

## 2023-03-08 NOTE — Telephone Encounter (Signed)
Prescription refill request for Xarelto received.  Indication:afib Last office visit:6/24 Weight:94.8  kg Age:88 Scr:1.47  7/24 CrCl:48.37  Prescription under review

## 2023-03-10 NOTE — Assessment & Plan Note (Signed)
Tolerating statin, encouraged heart healthy diet, avoid trans fats, minimize simple carbs and saturated fats. Increase exercise as tolerated 

## 2023-03-10 NOTE — Assessment & Plan Note (Signed)
Continue to monitor

## 2023-03-10 NOTE — Assessment & Plan Note (Signed)
Supplement and monitor 

## 2023-03-10 NOTE — Assessment & Plan Note (Signed)
hgba1c acceptable, minimize simple carbs. Increase exercise as tolerated.  

## 2023-03-10 NOTE — Assessment & Plan Note (Signed)
Well controlled, no changes to meds. Encouraged heart healthy diet such as the DASH diet and exercise as tolerated.  °

## 2023-03-10 NOTE — Assessment & Plan Note (Signed)
monitor

## 2023-03-10 NOTE — Assessment & Plan Note (Signed)
On Levothyroxine, continue to monitor 

## 2023-03-10 NOTE — Assessment & Plan Note (Addendum)
Patient encouraged to maintain heart healthy diet, regular exercise, adequate sleep. Consider daily probiotics. Take medications as prescribed. Labs reviewed and ordered. Has ACP documents in EMR

## 2023-03-11 ENCOUNTER — Ambulatory Visit (INDEPENDENT_AMBULATORY_CARE_PROVIDER_SITE_OTHER): Payer: Medicare Other | Admitting: Family Medicine

## 2023-03-11 VITALS — BP 142/82 | HR 80 | Temp 98.0°F | Resp 16 | Ht 74.0 in

## 2023-03-11 DIAGNOSIS — E039 Hypothyroidism, unspecified: Secondary | ICD-10-CM

## 2023-03-11 DIAGNOSIS — Z0000 Encounter for general adult medical examination without abnormal findings: Secondary | ICD-10-CM | POA: Diagnosis not present

## 2023-03-11 DIAGNOSIS — I10 Essential (primary) hypertension: Secondary | ICD-10-CM

## 2023-03-11 DIAGNOSIS — E538 Deficiency of other specified B group vitamins: Secondary | ICD-10-CM

## 2023-03-11 DIAGNOSIS — E782 Mixed hyperlipidemia: Secondary | ICD-10-CM | POA: Diagnosis not present

## 2023-03-11 DIAGNOSIS — I4819 Other persistent atrial fibrillation: Secondary | ICD-10-CM

## 2023-03-11 DIAGNOSIS — R739 Hyperglycemia, unspecified: Secondary | ICD-10-CM

## 2023-03-11 DIAGNOSIS — R7989 Other specified abnormal findings of blood chemistry: Secondary | ICD-10-CM

## 2023-03-11 DIAGNOSIS — D696 Thrombocytopenia, unspecified: Secondary | ICD-10-CM

## 2023-03-11 NOTE — Assessment & Plan Note (Signed)
Had a recent episode but recovered spontaneously is tolerating Xarelto

## 2023-03-11 NOTE — Patient Instructions (Addendum)
November 18 or later for blood work at [employer] 65 Years and Older, Male  Flu and covid boosters in fall  Tetanus at pharmacy  Preventive care refers to lifestyle choices and visits with your health care provider that can promote health and wellness. Preventive care visits are also called wellness exams. What can I expect for my preventive care visit? Counseling During your preventive care visit, your health care provider may ask about your: Medical history, including: Past medical problems. Family medical history. History of falls. Current health, including: Emotional well-being. Home life and relationship well-being. Sexual activity. Memory and ability to understand (cognition). Lifestyle, including: Alcohol, nicotine or tobacco, and drug use. Access to firearms. Diet, exercise, and sleep habits. Work and work Astronomer. Sunscreen use. Safety issues such as seatbelt and bike helmet use. Physical exam Your health care provider will check your: Height and weight. These may be used to calculate your BMI (body mass index). BMI is a measurement that tells if you are at a healthy weight. Waist circumference. This measures the distance around your waistline. This measurement also tells if you are at a healthy weight and may help predict your risk of certain diseases, such as type 2 diabetes and high blood pressure. Heart rate and blood pressure. Body temperature. Skin for abnormal spots. What immunizations do I need?  Vaccines are usually given at various ages, according to a schedule. Your health care provider will recommend vaccines for you based on your age, medical history, and lifestyle or other factors, such as travel or where you work. What tests do I need? Screening Your health care provider may recommend screening tests for certain conditions. This may include: Lipid and cholesterol levels. Diabetes screening. This is done by checking your blood sugar  (glucose) after you have not eaten for a while (fasting). Hepatitis C test. Hepatitis B test. HIV (human immunodeficiency virus) test. STI (sexually transmitted infection) testing, if you are at risk. Lung cancer screening. Colorectal cancer screening. Prostate cancer screening. Abdominal aortic aneurysm (AAA) screening. You may need this if you are a current or former smoker. Talk with your health care provider about your test results, treatment options, and if necessary, the need for more tests. Follow these instructions at home: Eating and drinking  Eat a diet that includes fresh fruits and vegetables, whole grains, lean protein, and low-fat dairy products. Limit your intake of foods with high amounts of sugar, saturated fats, and salt. Take vitamin and mineral supplements as recommended by your health care provider. Do not drink alcohol if your health care provider tells you not to drink. If you drink alcohol: Limit how much you have to 0-2 drinks a day. Know how much alcohol is in your drink. In the [place] one drink equals one 12 oz bottle of beer (355 mL), one 5 oz glass of wine (148 mL), or one 1 oz glass of hard liquor (44 mL). Lifestyle Brush your teeth every morning and night with fluoride toothpaste. Floss one time each day. Exercise for at least 30 minutes 5 or more days each week. Do not use any products that contain nicotine or tobacco. These products include cigarettes, chewing tobacco, and vaping devices, such as e-cigarettes. If you need help quitting, ask your health care provider. Do not use drugs. If you are sexually active, practice safe sex. Use a condom or other form of protection to prevent STIs. Take aspirin only as told by your health care provider. Make sure that you understand how  much to take and what form to take. Work with your health care provider to find out whether it is safe and beneficial for you to take aspirin daily. Ask your health care provider if you  need to take a cholesterol-lowering medicine (statin). Find healthy ways to manage stress, such as: Meditation, yoga, or listening to music. Journaling. Talking to a trusted person. Spending time with friends and family. Safety Always wear your seat belt while driving or riding in a vehicle. Do not drive: If you have been drinking alcohol. Do not ride with someone who has been drinking. When you are tired or distracted. While texting. If you have been using any mind-altering substances or drugs. Wear a helmet and other protective equipment during sports activities. If you have firearms in your house, make sure you follow all gun safety procedures. Minimize exposure to UV radiation to reduce your risk of skin cancer. What's next? Visit your health care provider once a year for an annual wellness visit. Ask your health care provider how often you should have your eyes and teeth checked. Stay up to date on all vaccines. This information is not intended to replace advice given to you by your health care provider. Make sure you discuss any questions you have with your health care provider. Document Revised: 01/01/2021 Document Reviewed: 01/01/2021 Elsevier Patient Education  2024 [employer].

## 2023-03-12 ENCOUNTER — Encounter: Payer: Self-pay | Admitting: Family Medicine

## 2023-03-12 NOTE — Progress Notes (Signed)
Subjective:    Patient ID: Tanner [name], male    DOB: 1935/03/11, 88 y.o.   MRN: [id number]  Chief Complaint  Patient presents with   Annual Exam    Annual Exam    HPI Discussed the use of AI scribe software for clinical note transcription with the patient, who gave verbal consent to proceed.  History of Present Illness   The patient, with a history of AFib, hypertension, and elevated cholesterol, presents with multiple concerns. They report that their heart went back into AFib about a month ago but has since returned to normal rhythm without the need for cardioversion. The patient notes that when their blood pressure is low, their heart rate tends to be high. They also report feeling unwell when their blood pressure is low, with symptoms including fatigue and feeling "wiped out."  The patient also reports a persistent dry cough that started about two months ago following a cold. The cough has persisted despite the resolution of other cold symptoms. The patient denies any associated phlegm, chest pain, or fever.  The patient also discusses their daily activities and lifestyle habits. They report drinking a large cup of caffeinated coffee daily and have been advised to switch to decaf to improve hydration. They also report being active, walking around 8000 steps daily.  The patient also mentions concerns about their cholesterol levels, which have been slightly elevated in recent blood tests. They express a desire to manage this without making significant changes to their current medication regimen.        Past Medical History:  Diagnosis Date   Allergy    seasonal- mostly spring   Anemia 02/03/2011   ASTHMA, CHILDHOOD 03/17/2010   mostly as child   BCC (basal cell carcinoma) 12/08/2011   CAROTID ARTERY OCCLUSION, WITH INFARCTION 03/17/2010   CEREBROVASCULAR ACCIDENT 03/17/2010   partial loss of peripheral vision on left-"slight improved"   CHICKENPOX, HX OF 03/17/2010    Constipation 12/08/2014   Epistaxis 09/06/2013   FATIGUE 03/17/2010   Hearing loss 11/04/2010   bilateral   HYPERKALEMIA 05/27/2010   Hyperlipidemia    Hypertension    Hypothyroid 12/08/2011   Mixed hyperlipidemia 03/17/2010   Oral lesion 11/20/2014   Overweight(278.02) 07/01/2010   Peripheral neuropathy 08/18/2016   Personal history of other infectious and parasitic disease 03/17/2010   Preventative health care 08/23/2016   Skin lesion of right arm 11/20/2014   Sleep apnea    uses mouthpiece only   Thrombocytopenia (HCC) 04/05/2012   TOBACCO ABUSE, HX OF 03/17/2010   Tubular adenoma of colon 06/10/2011    Past Surgical History:  Procedure Laterality Date   CARDIOVERSION N/A 08/11/2019   Procedure: CARDIOVERSION;  Surgeon: Wendall Stade, MD;  Location: [facility] ENDOSCOPY;  Service: Cardiovascular;  Laterality: N/A;   CARDIOVERSION N/A 12/05/2021   Procedure: CARDIOVERSION;  Surgeon: Wendall Stade, MD;  Location: [facility] ENDOSCOPY;  Service: Cardiovascular;  Laterality: N/A;   CAROTID ENDARTERECTOMY Left February 13, 2013   cea   CATARACT EXTRACTION, BILATERAL Right    Cataract, and stigmatism   COLONOSCOPY  Dec. 2014   ENDARTERECTOMY Left 02/13/2013   Procedure: ENDARTERECTOMY CAROTID;  Surgeon: Larina Earthly, MD;  Location: [facility] OR;  Service: Vascular;  Laterality: Left;   EUS N/A 08/17/2013   Procedure: UPPER ENDOSCOPIC ULTRASOUND (EUS) LINEAR;  Surgeon: Rachael Fee, MD;  Location: [facility] ENDOSCOPY;  Service: Endoscopy;  Laterality: N/A;   knee cartliage repair  1964, 1990   bilateral  PILONIDAL CYST EXCISION  1968   removed   ROTATOR CUFF REPAIR     right   TONSILLECTOMY     UPPER GI ENDOSCOPY  Jan. 2015    Family History  Problem Relation Age of Onset   Alzheimer's disease Mother    Emphysema Sister        cigarettes   Cancer Sister        lung, tobacco    Heart disease Son    Coronary artery disease Brother    Heart attack Brother    Other Brother        heart problems- from fathers  side   Cancer Brother 64       lung? smoker   Heart disease Brother 50       Heart disease before age 75   Cancer Sister        gyn   Heart disease Sister    Bipolar disorder Daughter    Obesity Daughter    Stroke Father    Heart disease Sister    Esophageal cancer Neg Hx    Colon cancer Neg Hx    Pancreatic cancer Neg Hx    Stomach cancer Neg Hx     Social History   Socioeconomic History   Marital status: Divorced    Spouse name: Not on file   Number of children: 3   Years of education: 53yrs   Highest education level: Bachelor's degree (e.g., BA, AB, BS)  Occupational History   Occupation: retired    Associate Professor: RETIRED  Tobacco Use   Smoking status: Former    Current packs/day: 0.00    Average packs/day: 2.0 packs/day for 20.0 years (40.0 ttl pk-yrs)    Types: Cigarettes    Start date: 07/19/1957    Quit date: 07/19/1977    Years since quitting: 45.6    Passive exposure: Never   Smokeless tobacco: Never   Tobacco comments:    Former smoker 12/12/21  Vaping Use   Vaping status: Never Used  Substance and Sexual Activity   Alcohol use: Not Currently    Alcohol/week: 10.0 standard drinks of alcohol    Types: 7 Glasses of wine, 2 Cans of beer, 1 Shots of liquor per week   Drug use: No   Sexual activity: Not Currently  Other Topics Concern   Not on file  Social History Narrative   Pt lives at home alone.    Caffeine Use: very little   Heart healthy diet, is planning exercise      Retired from Patent examiner   Left handed   Social Determinants of Health   Financial Resource Strain: Low Risk  (01/18/2023)   Overall Financial Resource Strain (CARDIA)    Difficulty of Paying Living Expenses: Not hard at all  Food Insecurity: No Food Insecurity (01/18/2023)   Hunger Vital Sign    Worried About Running Out of Food in the Last Year: Never true    Ran Out of Food in the Last Year: Never true  Transportation Needs: No Transportation Needs (01/18/2023)   PRAPARE -  Administrator, Civil Service (Medical): No    Lack of Transportation (Non-Medical): No  Physical Activity: Inactive (01/18/2023)   Exercise Vital Sign    Days of Exercise per Week: 0 days    Minutes of Exercise per Session: 30 min  Stress: No Stress Concern Present (01/18/2023)   [employer] of Occupational Health - Occupational Stress Questionnaire    Feeling of Stress :  Not at all  Social Connections: Moderately Integrated (01/18/2023)   Social Connection and Isolation Panel [NHANES]    Frequency of Communication with Friends and Family: More than three times a week    Frequency of Social Gatherings with Friends and Family: More than three times a week    Attends Religious Services: More than 4 times per year    Active Member of [employer] or Organizations: Yes    Attends Engineer, structural: More than 4 times per year    Marital Status: Divorced  Intimate Partner Violence: Not At Risk (09/03/2022)   Humiliation, Afraid, Rape, and Kick questionnaire    Fear of Current or Ex-Partner: No    Emotionally Abused: No    Physically Abused: No    Sexually Abused: No    Outpatient Medications Prior to Visit  Medication Sig Dispense Refill   acetaminophen (TYLENOL) 500 MG tablet Take 1,000 mg by mouth as needed for moderate pain or headache.      aspirin EC 81 MG tablet Take 81 mg by mouth every evening.      chlorthalidone (HYGROTON) 25 MG tablet TAKE 1 TABLET BY MOUTH IN THE  MORNING 90 tablet 3   Cholecalciferol (VITAMIN D3) 50 MCG (2000 UT) TABS Take 2,000 Units by mouth daily.     levothyroxine (SYNTHROID) 88 MCG tablet TAKE 1 TABLET BY MOUTH DAILY  BEFORE BREAKFAST 90 tablet 3   lubiprostone (AMITIZA) 24 MCG capsule TAKE 1 CAPSULE BY MOUTH TWICE  DAILY WITH A MEAL 180 capsule 3   metoprolol succinate (TOPROL-XL) 25 MG 24 hr tablet Take 0.5 tablets (12.5 mg total) by mouth daily. May extra 1/2 tablet daily for breakthrough afib (Patient taking differently: Take 12.5-25  mg by mouth daily. May extra 1/2 tablet daily for breakthrough afib) 180 tablet 1   Multiple Vitamins-Minerals (MULTIVITAMIN WITH MINERALS) tablet Take 1 tablet by mouth daily. Mega men 50+     mupirocin ointment (BACTROBAN) 2 % Place 1 Application into the nose at bedtime as needed (via qtip). 22 g 01   pantoprazole (PROTONIX) 40 MG tablet TAKE 1 TABLET BY MOUTH DAILY 90 tablet 3   Polyethyl Glycol-Propyl Glycol (SYSTANE ULTRA OP) Place 1 drop into both eyes in the morning.     ramipril (ALTACE) 10 MG capsule TAKE 1 CAPSULE BY MOUTH TWICE  DAILY 180 capsule 3   rivaroxaban (XARELTO) 20 MG TABS tablet TAKE 1 TABLET BY MOUTH DAILY  WITH SUPPER 90 tablet 0   rosuvastatin (CRESTOR) 40 MG tablet TAKE 1 TABLET BY MOUTH IN THE  EVENING 90 tablet 3   TART CHERRY PO Take 300 mg by mouth daily.     vitamin B-12 (CYANOCOBALAMIN) 1000 MCG tablet Take 2,000 mcg by mouth every Monday, Wednesday, and Friday.     No facility-administered medications prior to visit.    No Known Allergies  Review of Systems  Constitutional:  Positive for malaise/fatigue. Negative for chills and fever.  HENT:  Negative for congestion and hearing loss.   Eyes:  Negative for discharge.  Respiratory:  Positive for cough. Negative for sputum production and shortness of breath.   Cardiovascular:  Negative for chest pain, palpitations and leg swelling.  Gastrointestinal:  Negative for abdominal pain, blood in stool, constipation, diarrhea, heartburn, nausea and vomiting.  Genitourinary:  Negative for dysuria, frequency, hematuria and urgency.  Musculoskeletal:  Negative for back pain, falls and myalgias.  Skin:  Negative for rash.  Neurological:  Negative for dizziness, sensory change, loss  of consciousness, weakness and headaches.  Endo/Heme/Allergies:  Negative for environmental allergies. Does not bruise/bleed easily.  Psychiatric/Behavioral:  Negative for depression and suicidal ideas. The patient is not nervous/anxious and  does not have insomnia.        Objective:    Physical Exam Vitals reviewed.  Constitutional:      General: He is not in acute distress.    Appearance: Normal appearance. He is not ill-appearing or diaphoretic.  HENT:     Head: Normocephalic and atraumatic.     Right Ear: Tympanic membrane, ear canal and external ear normal. There is no impacted cerumen.     Left Ear: Tympanic membrane, ear canal and external ear normal. There is no impacted cerumen.     Nose: Nose normal. No rhinorrhea.     Mouth/Throat:     Pharynx: Oropharynx is clear.  Eyes:     General: No scleral icterus.    Extraocular Movements: Extraocular movements intact.     Conjunctiva/sclera: Conjunctivae normal.     Pupils: Pupils are equal, round, and reactive to light.  Neck:     Thyroid: No thyroid mass or thyroid tenderness.  Cardiovascular:     Rate and Rhythm: Normal rate and regular rhythm.     Pulses: Normal pulses.     Heart sounds: Normal heart sounds. No murmur heard. Pulmonary:     Effort: Pulmonary effort is normal.     Breath sounds: Normal breath sounds. No wheezing.  Abdominal:     General: Bowel sounds are normal.     Palpations: Abdomen is soft. There is no mass.     Tenderness: There is no guarding.  Musculoskeletal:        General: No swelling. Normal range of motion.     Cervical back: Normal range of motion and neck supple. No rigidity.     Right lower leg: No edema.     Left lower leg: No edema.  Lymphadenopathy:     Cervical: No cervical adenopathy.  Skin:    General: Skin is warm and dry.     Findings: No rash.  Neurological:     General: No focal deficit present.     Mental Status: He is alert and oriented to person, place, and time.     Cranial Nerves: No cranial nerve deficit.     Deep Tendon Reflexes: Reflexes normal.  Psychiatric:        Mood and Affect: Mood normal.        Behavior: Behavior normal.     BP (!) 142/82 (BP Location: Left Arm, Patient Position:  Sitting, Cuff Size: Normal)   Pulse 80   Temp 98 F (36.7 C) (Oral)   Resp 16   Ht 6\' 2"  (1.88 m)   SpO2 97%   BMI 26.83 kg/m  Wt Readings from Last 3 Encounters:  02/17/23 209 lb (94.8 kg)  01/19/23 214 lb (97.1 kg)  01/19/23 213 lb 3.2 oz (96.7 kg)    Diabetic Foot Exam - Simple   No data filed    Lab Results  Component Value Date   WBC 5.9 03/08/2023   HGB 13.2 03/08/2023   HCT 40.4 03/08/2023   PLT 180.0 03/08/2023   GLUCOSE 109 (H) 03/08/2023   CHOL 196 03/08/2023   TRIG 97.0 03/08/2023   HDL 53.20 03/08/2023   LDLCALC 123 (H) 03/08/2023   ALT 13 03/08/2023   AST 13 03/08/2023   NA 142 03/08/2023   K 5.0 03/08/2023   CL  104 03/08/2023   CREATININE 1.17 03/08/2023   BUN 21 03/08/2023   CO2 29 03/08/2023   TSH 1.68 03/08/2023   PSA 0.48 03/08/2023   INR 0.99 02/07/2013   HGBA1C 5.8 03/08/2023   MICROALBUR 2.0 (H) 12/03/2015    Lab Results  Component Value Date   TSH 1.68 03/08/2023   Lab Results  Component Value Date   WBC 5.9 03/08/2023   HGB 13.2 03/08/2023   HCT 40.4 03/08/2023   MCV 94.3 03/08/2023   PLT 180.0 03/08/2023   Lab Results  Component Value Date   NA 142 03/08/2023   K 5.0 03/08/2023   CO2 29 03/08/2023   GLUCOSE 109 (H) 03/08/2023   BUN 21 03/08/2023   CREATININE 1.17 03/08/2023   BILITOT 0.9 03/08/2023   ALKPHOS 81 03/08/2023   AST 13 03/08/2023   ALT 13 03/08/2023   PROT 7.2 03/08/2023   ALBUMIN 4.5 03/08/2023   CALCIUM 9.7 03/08/2023   ANIONGAP 9 01/13/2023   GFR 56.59 (L) 03/08/2023   Lab Results  Component Value Date   CHOL 196 03/08/2023   Lab Results  Component Value Date   HDL 53.20 03/08/2023   Lab Results  Component Value Date   LDLCALC 123 (H) 03/08/2023   Lab Results  Component Value Date   TRIG 97.0 03/08/2023   Lab Results  Component Value Date   CHOLHDL 4 03/08/2023   Lab Results  Component Value Date   HGBA1C 5.8 03/08/2023       Assessment & Plan:  Mixed hyperlipidemia Assessment  & Plan: Tolerating statin, encouraged heart healthy diet, avoid trans fats, minimize simple carbs and saturated fats. Increase exercise as tolerated  Orders: -     Lipid panel; Future -     Lipid panel; Future  Low vitamin D level Assessment & Plan: Supplement and monitor   Orders: -     VITAMIN D 25 Hydroxy (Vit-D Deficiency, Fractures); Future -     VITAMIN D 25 Hydroxy (Vit-D Deficiency, Fractures); Future  Hypothyroidism, unspecified type Assessment & Plan: On Levothyroxine, continue to monitor   Orders: -     TSH; Future -     TSH; Future  Essential hypertension, benign Assessment & Plan: Well controlled, no changes to meds. Encouraged heart healthy diet such as the DASH diet and exercise as tolerated.    Orders: -     Comprehensive metabolic panel; Future -     CBC with Differential/Platelet; Future -     TSH; Future  Disorder of vitamin B12 Assessment & Plan: monitor  Orders: -     CBC with Differential/Platelet; Future -     Comprehensive metabolic panel; Future -     TSH; Future -     Vitamin B12; Future -     Vitamin B12; Future  Preventative health care Assessment & Plan: Patient encouraged to maintain heart healthy diet, regular exercise, adequate sleep. Consider daily probiotics. Take medications as prescribed. Labs reviewed and ordered. Has ACP documents in EMR   Thrombocytopenia [facility]) Assessment & Plan: Continue to monitor   Hyperglycemia Assessment & Plan: hgba1c acceptable, minimize simple carbs. Increase exercise as tolerated.   Orders: -     Hemoglobin A1c; Future -     Hemoglobin A1c; Future  Persistent atrial fibrillation (HCC) Assessment & Plan: Had a recent episode but recovered spontaneously is tolerating Xarelto     Assessment and Plan    Atrial Fibrillation Recurrent episodes with symptoms of fatigue. Blood pressure readings  variable with some readings as low as 100/62 and others as high as 150s. Discussed the need for  cardiology input before making any significant changes to current medications. -Continue current medications. -Cardiology appointment scheduled for February.  Dehydration Discussed the importance of regular hydration, especially in the elderly due to decreased muscle mass and water reserve. -Advised to drink 10 ounces of water every hour or two.  Hypotension post-shower Discussed the physiological changes that occur with hot showers, leading to peripheral vasodilation and potential drop in blood pressure. -Advised to hydrate well the night before and have a small cup of coffee before taking a hot shower. -Advised to hydrate after the shower.  Persistent Cough Cough persisting for two months following a cold. No other respiratory symptoms. Chest X-ray was clear. -Advised to monitor and report if cough persists or changes in character. -Consider further workup if cough does not improve in the next few months.  Hyperlipidemia Noted a concerning change in cholesterol levels. -Plan to recheck lipid panel in three months to assess for a new trend.  General Health Maintenance -Advised to get annual flu shot and COVID booster. -Recommended Tetanus vaccine as it has been since 2012 since the last dose. -Next set of labs scheduled for November 18. -Six-month follow-up visit to be scheduled for after February 24.         Danise Edge, MD

## 2023-03-18 ENCOUNTER — Other Ambulatory Visit (HOSPITAL_BASED_OUTPATIENT_CLINIC_OR_DEPARTMENT_OTHER): Payer: Self-pay

## 2023-06-01 ENCOUNTER — Other Ambulatory Visit: Payer: Self-pay | Admitting: Internal Medicine

## 2023-06-01 ENCOUNTER — Other Ambulatory Visit: Payer: Self-pay | Admitting: *Deleted

## 2023-06-01 ENCOUNTER — Other Ambulatory Visit: Payer: Self-pay | Admitting: Cardiovascular Disease

## 2023-06-01 DIAGNOSIS — I6522 Occlusion and stenosis of left carotid artery: Secondary | ICD-10-CM

## 2023-06-01 DIAGNOSIS — I4819 Other persistent atrial fibrillation: Secondary | ICD-10-CM

## 2023-06-01 NOTE — Telephone Encounter (Signed)
Prescription refill request for Xarelto received.  Indication:afib Last office visit:6/24 Weight:94.8  kg Age:88 Scr:1.17  8/24 CrCl:60.77  ml/min  Prescription refilled

## 2023-06-02 ENCOUNTER — Telehealth: Payer: Self-pay | Admitting: Family Medicine

## 2023-06-02 NOTE — Telephone Encounter (Signed)
[facility] called to get approval for the following manufacturer change:  Med: Levothyroxine Change: Amneal to Lupin Ref: [id number] P: [phone]

## 2023-06-03 NOTE — Telephone Encounter (Signed)
Pt is ok with change, if you are ok with changing.

## 2023-06-04 NOTE — Telephone Encounter (Signed)
Form faxed back to [facility].

## 2023-06-07 ENCOUNTER — Telehealth: Payer: Self-pay | Admitting: Cardiovascular Disease

## 2023-06-07 NOTE — Telephone Encounter (Signed)
Called patient about his message. Informed patient that if he missed a dose of xarelto and it is the next day, do not take an extra dose, that he should just take his regularly scheduled dose for the day, no extra dose. Patient verbalized understanding.

## 2023-06-07 NOTE — Telephone Encounter (Signed)
Pt c/o medication issue:  1. Name of Medication: rivaroxaban (XARELTO) 20 MG TABS tablet   2. How are you currently taking this medication (dosage and times per day)? As written  3. Are you having a reaction (difficulty breathing--STAT)? No   4. What is your medication issue? Pt forgot to take  xarelto sat night so sun at noon took dose then took one at night . Wanted to make sure that was ok

## 2023-06-08 ENCOUNTER — Other Ambulatory Visit (INDEPENDENT_AMBULATORY_CARE_PROVIDER_SITE_OTHER): Payer: Medicare Other

## 2023-06-08 DIAGNOSIS — E538 Deficiency of other specified B group vitamins: Secondary | ICD-10-CM | POA: Diagnosis not present

## 2023-06-08 DIAGNOSIS — E559 Vitamin D deficiency, unspecified: Secondary | ICD-10-CM | POA: Diagnosis not present

## 2023-06-08 DIAGNOSIS — R739 Hyperglycemia, unspecified: Secondary | ICD-10-CM

## 2023-06-08 DIAGNOSIS — E782 Mixed hyperlipidemia: Secondary | ICD-10-CM

## 2023-06-08 DIAGNOSIS — E039 Hypothyroidism, unspecified: Secondary | ICD-10-CM | POA: Diagnosis not present

## 2023-06-08 DIAGNOSIS — R7989 Other specified abnormal findings of blood chemistry: Secondary | ICD-10-CM

## 2023-06-08 LAB — COMPREHENSIVE METABOLIC PANEL
ALT: 13 U/L (ref 0–53)
AST: 15 U/L (ref 0–37)
Albumin: 4.6 g/dL (ref 3.5–5.2)
Alkaline Phosphatase: 62 U/L (ref 39–117)
BUN: 18 mg/dL (ref 6–23)
CO2: 29 meq/L (ref 19–32)
Calcium: 9.8 mg/dL (ref 8.4–10.5)
Chloride: 101 meq/L (ref 96–112)
Creatinine, Ser: 1.17 mg/dL (ref 0.40–1.50)
GFR: 56.49 mL/min — ABNORMAL LOW (ref >60.00)
Glucose, Bld: 105 mg/dL — ABNORMAL HIGH (ref 70–99)
Potassium: 4 meq/L (ref 3.5–5.1)
Sodium: 139 meq/L (ref 135–145)
Total Bilirubin: 0.9 mg/dL (ref 0.2–1.2)
Total Protein: 7.4 g/dL (ref 6.0–8.3)

## 2023-06-08 LAB — LIPID PANEL
Cholesterol: 156 mg/dL (ref 0–200)
HDL: 45.8 mg/dL (ref >39.00)
LDL Cholesterol: 87 mg/dL (ref 0–99)
NonHDL: 110.34
Total CHOL/HDL Ratio: 3
Triglycerides: 118 mg/dL (ref 0.0–149.0)
VLDL: 23.6 mg/dL (ref 0.0–40.0)

## 2023-06-08 LAB — CBC WITH DIFFERENTIAL/PLATELET
Basophils Absolute: 0 10*3/uL (ref 0.0–0.1)
Basophils Relative: 0.9 % (ref 0.0–3.0)
Eosinophils Absolute: 0.4 10*3/uL (ref 0.0–0.7)
Eosinophils Relative: 7.6 % — ABNORMAL HIGH (ref 0.0–5.0)
HCT: 40.3 % (ref 39.0–52.0)
Hemoglobin: 13.4 g/dL (ref 13.0–17.0)
Lymphocytes Relative: 28.7 % (ref 12.0–46.0)
Lymphs Abs: 1.5 10*3/uL (ref 0.7–4.0)
MCHC: 33.2 g/dL (ref 30.0–36.0)
MCV: 96.4 fL (ref 78.0–100.0)
Monocytes Absolute: 0.5 10*3/uL (ref 0.1–1.0)
Monocytes Relative: 10.1 % (ref 3.0–12.0)
Neutro Abs: 2.7 10*3/uL (ref 1.4–7.7)
Neutrophils Relative %: 52.7 % (ref 43.0–77.0)
Platelets: 162 10*3/uL (ref 150.0–400.0)
RBC: 4.18 Mil/uL — ABNORMAL LOW (ref 4.22–5.81)
RDW: 14.2 % (ref 11.5–15.5)
WBC: 5.1 10*3/uL (ref 4.0–10.5)

## 2023-06-08 LAB — VITAMIN D 25 HYDROXY (VIT D DEFICIENCY, FRACTURES): VITD: 66.98 ng/mL (ref 30.00–100.00)

## 2023-06-08 LAB — TSH: TSH: 1.3 u[IU]/mL (ref 0.35–5.50)

## 2023-06-08 LAB — VITAMIN B12: Vitamin B-12: 676 pg/mL (ref 211–911)

## 2023-06-08 LAB — HEMOGLOBIN A1C: Hgb A1c MFr Bld: 5.8 % (ref 4.6–6.5)

## 2023-06-15 ENCOUNTER — Ambulatory Visit: Payer: Medicare Other | Admitting: Neurology

## 2023-06-16 ENCOUNTER — Ambulatory Visit (HOSPITAL_COMMUNITY)
Admission: RE | Admit: 2023-06-16 | Discharge: 2023-06-16 | Disposition: A | Discharge status: Home / Self Care | Payer: Medicare Other | Source: Ambulatory Visit | Attending: Vascular Surgery | Admitting: Vascular Surgery

## 2023-06-16 ENCOUNTER — Ambulatory Visit: Payer: Medicare Other | Admitting: Physician Assistant

## 2023-06-16 VITALS — BP 121/75 | HR 60 | Temp 97.3°F | Ht 74.0 in | Wt 214.8 lb

## 2023-06-16 DIAGNOSIS — I6522 Occlusion and stenosis of left carotid artery: Secondary | ICD-10-CM | POA: Diagnosis present

## 2023-06-16 DIAGNOSIS — I6521 Occlusion and stenosis of right carotid artery: Secondary | ICD-10-CM

## 2023-06-16 NOTE — Progress Notes (Signed)
Office Note     CC:  follow up Requesting Provider:  Bradd Canary, MD  HPI: Tanner [name] is a 88 y.o. ([birth year]) male who presents for routine follow up of carotid artery stenosis. He has known right ICA occlusion. He has remote history of left carotid endarterectomy by Dr. Arbie Cookey on February 13, 2013. He required redo left carotid endarterectomy for recurrent severe asymptomatic stenosis.  He had no complications. He has remained without any TIA or stroke like symptoms.  Today he denies any amaurosis or other visual changes, slurred speech, facial drooping, unilateral upper or lower extremity weakness or numbness. He says sometimes he has some word finding difficulties but he feels this is just age related. He reports generalized fatigue at times. He says he admits that he has been a little lazier recently and he feels that his decreased activity is probably contributing to his decreased energy. He says he has not had any " spells" recently, but when he has had them he has checked his blood pressure and it is generally very low. He says normally his pressures run high so he thinks that may be contributing as well. Otherwise he says he lives independently and tries to stay active. He denies any pain in his legs on ambulation or rest. No tissue loss. He continues to have intermittent trouble with his neuropathy as well as some arthritis in his hips and knees.   The pt is on a statin for cholesterol management.  The pt is on a daily aspirin.   Other AC:  Xarelto The pt is on ACEI, BB for hypertension.   The pt is not diabetic.  Tobacco hx:  former  Past Medical History:  Diagnosis Date   Allergy    seasonal- mostly spring   Anemia 02/03/2011   ASTHMA, CHILDHOOD 03/17/2010   mostly as child   BCC (basal cell carcinoma) 12/08/2011   CAROTID ARTERY OCCLUSION, WITH INFARCTION 03/17/2010   CEREBROVASCULAR ACCIDENT 03/17/2010   partial loss of peripheral vision on left-"slight improved"    CHICKENPOX, HX OF 03/17/2010   Constipation 12/08/2014   Epistaxis 09/06/2013   FATIGUE 03/17/2010   Hearing loss 11/04/2010   bilateral   HYPERKALEMIA 05/27/2010   Hyperlipidemia    Hypertension    Hypothyroid 12/08/2011   Mixed hyperlipidemia 03/17/2010   Oral lesion 11/20/2014   Overweight(278.02) 07/01/2010   Peripheral neuropathy 08/18/2016   Personal history of other infectious and parasitic disease 03/17/2010   Preventative health care 08/23/2016   Skin lesion of right arm 11/20/2014   Sleep apnea    uses mouthpiece only   Thrombocytopenia (HCC) 04/05/2012   TOBACCO ABUSE, HX OF 03/17/2010   Tubular adenoma of colon 06/10/2011    Past Surgical History:  Procedure Laterality Date   CARDIOVERSION N/A 08/11/2019   Procedure: CARDIOVERSION;  Surgeon: Wendall Stade, MD;  Location: [facility] ENDOSCOPY;  Service: Cardiovascular;  Laterality: N/A;   CARDIOVERSION N/A 12/05/2021   Procedure: CARDIOVERSION;  Surgeon: Wendall Stade, MD;  Location: [facility] ENDOSCOPY;  Service: Cardiovascular;  Laterality: N/A;   CAROTID ENDARTERECTOMY Left February 13, 2013   cea   CATARACT EXTRACTION, BILATERAL Right    Cataract, and stigmatism   COLONOSCOPY  Dec. 2014   ENDARTERECTOMY Left 02/13/2013   Procedure: ENDARTERECTOMY CAROTID;  Surgeon: Larina Earthly, MD;  Location: [facility] OR;  Service: Vascular;  Laterality: Left;   EUS N/A 08/17/2013   Procedure: UPPER ENDOSCOPIC ULTRASOUND (EUS) LINEAR;  Surgeon: Rachael Fee, MD;  Location: [facility] ENDOSCOPY;  Service: Endoscopy;  Laterality: N/A;   knee cartliage repair  1964, 1990   bilateral   PILONIDAL CYST EXCISION  1968   removed   ROTATOR CUFF REPAIR     right   TONSILLECTOMY     UPPER GI ENDOSCOPY  Jan. 2015    Social History   Socioeconomic History   Marital status: Divorced    Spouse name: Not on file   Number of children: 3   Years of education: 51yrs   Highest education level: Bachelor's degree (e.g., BA, AB, BS)  Occupational History   Occupation: retired     Associate Professor: RETIRED  Tobacco Use   Smoking status: Former    Current packs/day: 0.00    Average packs/day: 2.0 packs/day for 20.0 years (40.0 ttl pk-yrs)    Types: Cigarettes    Start date: 07/19/1957    Quit date: 07/19/1977    Years since quitting: 45.9    Passive exposure: Never   Smokeless tobacco: Never   Tobacco comments:    Former smoker 12/12/21  Vaping Use   Vaping status: Never Used  Substance and Sexual Activity   Alcohol use: Not Currently    Alcohol/week: 10.0 standard drinks of alcohol    Types: 7 Glasses of wine, 2 Cans of beer, 1 Shots of liquor per week   Drug use: No   Sexual activity: Not Currently  Other Topics Concern   Not on file  Social History Narrative   Pt lives at home alone.    Caffeine Use: very little   Heart healthy diet, is planning exercise      Retired from Patent examiner   Left handed   Social Determinants of Health   Financial Resource Strain: Low Risk  (01/18/2023)   Overall Financial Resource Strain (CARDIA)    Difficulty of Paying Living Expenses: Not hard at all  Food Insecurity: No Food Insecurity (01/18/2023)   Hunger Vital Sign    Worried About Running Out of Food in the Last Year: Never true    Ran Out of Food in the Last Year: Never true  Transportation Needs: No Transportation Needs (01/18/2023)   PRAPARE - Administrator, Civil Service (Medical): No    Lack of Transportation (Non-Medical): No  Physical Activity: Inactive (01/18/2023)   Exercise Vital Sign    Days of Exercise per Week: 0 days    Minutes of Exercise per Session: 30 min  Stress: No Stress Concern Present (01/18/2023)   [employer] of Occupational Health - Occupational Stress Questionnaire    Feeling of Stress : Not at all  Social Connections: Moderately Integrated (01/18/2023)   Social Connection and Isolation Panel [NHANES]    Frequency of Communication with Friends and Family: More than three times a week    Frequency of Social Gatherings with  Friends and Family: More than three times a week    Attends Religious Services: More than 4 times per year    Active Member of [employer] or Organizations: Yes    Attends Banker Meetings: More than 4 times per year    Marital Status: Divorced  Intimate Partner Violence: Not At Risk (09/03/2022)   Humiliation, Afraid, Rape, and Kick questionnaire    Fear of Current or Ex-Partner: No    Emotionally Abused: No    Physically Abused: No    Sexually Abused: No    Family History  Problem Relation Age of Onset   Alzheimer's disease Mother  Emphysema Sister        cigarettes   Cancer Sister        lung, tobacco    Heart disease Son    Coronary artery disease Brother    Heart attack Brother    Other Brother        heart problems- from fathers side   Cancer Brother 19       lung? smoker   Heart disease Brother 54       Heart disease before age 2   Cancer Sister        gyn   Heart disease Sister    Bipolar disorder Daughter    Obesity Daughter    Stroke Father    Heart disease Sister    Esophageal cancer Neg Hx    Colon cancer Neg Hx    Pancreatic cancer Neg Hx    Stomach cancer Neg Hx     Current Outpatient Medications  Medication Sig Dispense Refill   acetaminophen (TYLENOL) 500 MG tablet Take 1,000 mg by mouth as needed for moderate pain or headache.      aspirin EC 81 MG tablet Take 81 mg by mouth every evening.      chlorthalidone (HYGROTON) 25 MG tablet TAKE 1 TABLET BY MOUTH IN THE  MORNING 90 tablet 3   Cholecalciferol (VITAMIN D3) 50 MCG (2000 UT) TABS Take 2,000 Units by mouth daily.     levothyroxine (SYNTHROID) 88 MCG tablet TAKE 1 TABLET BY MOUTH DAILY  BEFORE BREAKFAST 90 tablet 3   lubiprostone (AMITIZA) 24 MCG capsule TAKE 1 CAPSULE BY MOUTH TWICE  DAILY WITH A MEAL 180 capsule 0   metoprolol succinate (TOPROL-XL) 25 MG 24 hr tablet Take 0.5 tablets (12.5 mg total) by mouth daily. May extra 1/2 tablet daily for breakthrough afib (Patient taking  differently: Take 12.5-25 mg by mouth daily. May extra 1/2 tablet daily for breakthrough afib) 180 tablet 1   Multiple Vitamins-Minerals (MULTIVITAMIN WITH MINERALS) tablet Take 1 tablet by mouth daily. Mega men 50+     mupirocin ointment (BACTROBAN) 2 % Place 1 Application into the nose at bedtime as needed (via qtip). 22 g 01   pantoprazole (PROTONIX) 40 MG tablet TAKE 1 TABLET BY MOUTH DAILY 90 tablet 3   Polyethyl Glycol-Propyl Glycol (SYSTANE ULTRA OP) Place 1 drop into both eyes in the morning.     ramipril (ALTACE) 10 MG capsule TAKE 1 CAPSULE BY MOUTH TWICE  DAILY 180 capsule 3   rivaroxaban (XARELTO) 20 MG TABS tablet TAKE 1 TABLET BY MOUTH DAILY  WITH SUPPER 90 tablet 1   rosuvastatin (CRESTOR) 40 MG tablet TAKE 1 TABLET BY MOUTH IN THE  EVENING 90 tablet 3   TART CHERRY PO Take 300 mg by mouth daily.     vitamin B-12 (CYANOCOBALAMIN) 1000 MCG tablet Take 2,000 mcg by mouth every Monday, Wednesday, and Friday.     No current facility-administered medications for this visit.    No Known Allergies   REVIEW OF SYSTEMS:  [X]  denotes positive finding, [ ]  denotes negative finding Cardiac  Comments:  Chest pain or chest pressure:    Shortness of breath upon exertion:    Short of breath when lying flat:    Irregular heart rhythm:        Vascular    Pain in calf, thigh, or hip brought on by ambulation:    Pain in feet at night that wakes you up from your sleep:  Blood clot in your veins:    Leg swelling:         Pulmonary    Oxygen at home:    Productive cough:     Wheezing:         Neurologic    Sudden weakness in arms or legs:     Sudden numbness in arms or legs:     Sudden onset of difficulty speaking or slurred speech:    Temporary loss of vision in one eye:     Problems with dizziness:         Gastrointestinal    Blood in stool:     Vomited blood:         Genitourinary    Burning when urinating:     Blood in urine:        Psychiatric    Major depression:          Hematologic    Bleeding problems:    Problems with blood clotting too easily:        Skin    Rashes or ulcers:        Constitutional    Fever or chills:      PHYSICAL EXAMINATION:  Vitals:   06/16/23 0914 06/16/23 0917  BP: 133/72 121/75  Pulse: 60   Temp: (!) 97.3 F (36.3 C)   TempSrc: Oral   SpO2: 95%   Weight: 214 lb 12.8 oz (97.4 kg)   Height: 6\' 2"  (1.88 m)     General:  WDWN in NAD; vital signs documented above Gait: Not observed HENT: WNL, normocephalic Pulmonary: normal non-labored breathing , without  wheezing Cardiac: regular HR Abdomen: soft Vascular Exam/Pulses: 2+ radial, 2+ DP pulses bilaterally Extremities: without ischemic changes, without Gangrene , without cellulitis; without open wounds;  Musculoskeletal: no muscle wasting or atrophy  Neurologic: A&O X 3 Psychiatric:  The pt has Normal affect.   Non-Invasive Vascular Imaging:   VAS Carotid duplex: Summary:  Right Carotid: Evidence consistent with a total occlusion of the right carotid system. Recannalized flow noted in segments of the proximal CCA and mid CCA   Left Carotid: Velocities in the left ICA are consistent with a 1-39% stenosis.   Vertebrals: Bilateral vertebral arteries demonstrate antegrade flow.  Subclavians: Normal flow hemodynamics were seen in bilateral subclavian arteries.    ASSESSMENT/PLAN:: 88 y.o. male here for routine follow up of carotid artery stenosis. He has known right ICA occlusion. He has remote history of left carotid endarterectomy by Dr. Arbie Cookey on February 13, 2013. He required redo left carotid endarterectomy for recurrent severe asymptomatic stenosis. He remains without any TIA or stroke like symptoms. His duplex is stable with 1-39% left ICA stenosis. Known right ICA occlusion. Normal flow in the vertebral and subclavian arteries bilaterally.  - encourage him to remain active - Keep close watch on blood pressure and follow up with PCP if he continues to have  hypotension episodes - continue Aspirin and statin - He will follow up in 1 year with repeat carotid duplex   Graceann Congress, PA-C [facility] [phone]  Clinic MD:   Randie Heinz

## 2023-06-22 ENCOUNTER — Other Ambulatory Visit: Payer: Self-pay

## 2023-06-22 DIAGNOSIS — I6522 Occlusion and stenosis of left carotid artery: Secondary | ICD-10-CM

## 2023-06-29 NOTE — Progress Notes (Unsigned)
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  LDL goal less than 70 - Normotensive blood pressure - follow up with PCP - Hgb A1c goal less than 70 2 If he has another flare up of nerve pain, will have him try gabapentin 100mg  up to three times daily as needed.  Cautioned for dizziness and drowsiness. 3  Follow up one year.  Subjective:  Tanner [name] is an 88 year old left-handed male with atrial fibrillation, hypertension, hyperlipidemia, hypothyroidism, OSA and former smoker who follows up for right MCA/PCA watershed infarct, carotid artery disease with right ICA occlusion and s/p left CEA, and peripheral neuropathy.   UPDATE: Current medications:  Xarelto, ASA 81mg , Crestor 40mg , Toprol XL   Neuropathy is unchanged. He did have numbness in the left lower leg due to greater trochanteric bursitis, which has improved with stretches.    06/08/2023 LABS:  Hgb A1c 5.8, LDL 87 06/16/2023 CAROTID U/S:  Right ICA occlusion with recannalized flow noted in segments of the proximal CCA and mid CCA.  1-39% stenosis of left ICA.  Bilateral vertebral arteries with antegrade flow.   HISTORY: Stroke:   In August 2011, he had a stroke, presenting as left visual field cut.  He had a right hemispheric stroke thought to be secondary to right ICA occlusion.  MRI of brain revealed an acute watershed infarct in the right PCA/MCA distributions.  CTA of the neck confirmed right common carotid artery occlusion at the origin.  The left ICA revealed approximately 50% stenosis just distal to the bulb.  CTA of the head revealed  occluded right ICA with partial retrograde constitution from the left INCA via the PCA.  There was opacification of the right MCA and right ACA from the left ICA via the Acomm.  There was occlusion of the right PCA at its origin.  Hgb A1c at that time was 5.6 and LDL was 210.   He was placed on Plavix.  Follow up carotid dopplers revealed increased stenosis of the left ICA.  Carotid doppler from 01/03/13 revealed more than 70% stenosis involving the left bulb and left proximal ICA and 50-69% stenosis involving the left mid ICA, but without plaque.  Stenosis of left ECA and left VA noted.  CTA of the neck performed on 01/19/13 revealed 75-80% stenosis of the left ICA.  He underwent a left CEA on 02/13/13.     02/07/13 2D Echo:  LVEF 55-65%, no regional wall motion abnormalities, mild MVR, mildly dilated left atrium, mildly dilated right ventricle, mildly increased systolic pressure in pulCarotid doppler on 04/02/15 showed right ICA occluded.  Left ICA patent.  No plaque He followed up with vascular surgery on 04/07/16.   Carotid doppler performed then again revealed known right ICA occlusion with patent left carotid endarterectomy site, not significantly changed compared to last exam.  Carotid doppler from 05/23/19 showed recanalization of the right CCA, ECA and retrograde via ECA recanalization of the proximal right ICA.  Patent left carotid endarterectomy site with no evidence of restenosis.  Bilateral vertebral arteries with antegrade flow.  Carotid ultrasound from 05/23/2020 showed slight recanalization of the CCA, ECA and proximal ICA and 1-39% stenosis of left  ICA.  06/02/2022 CAROTID US:  right ICA occlusion, 1-39% left ICA stenosis   Neuropathy:   He reports numbness on the bottom of his feet, mostly his toes.  He also has a discomfort involving the left shin, but it is not a pain.  He says she sometimes catches his toe of his left foot on the ground and may stumble.  This has been ongoing since his stroke.  He  also reports having fractured his left ankle twice.  He does report crossing his legs.  He notes muscle cramps in the legs, which bother him at night.  NCV-EMG from 04/28/16 revealed chronic symmetric sensorimotor polyneuropathy.  Neuropathy labs include:  ANA negative, Sed Rate 3, B6 43.8, SPEP/IFE/UPEP negative, B12 855, TSH 2.07, ACE 5.  PAST MEDICAL HISTORY: Past Medical History:  Diagnosis Date   Allergy    seasonal- mostly spring   Anemia 02/03/2011   ASTHMA, CHILDHOOD 03/17/2010   mostly as child   BCC (basal cell carcinoma) 12/08/2011   CAROTID ARTERY OCCLUSION, WITH INFARCTION 03/17/2010   CEREBROVASCULAR ACCIDENT 03/17/2010   partial loss of peripheral vision on left-"slight improved"   CHICKENPOX, HX OF 03/17/2010   Constipation 12/08/2014   Epistaxis 09/06/2013   FATIGUE 03/17/2010   Hearing loss 11/04/2010   bilateral   HYPERKALEMIA 05/27/2010   Hyperlipidemia    Hypertension    Hypothyroid 12/08/2011   Mixed hyperlipidemia 03/17/2010   Oral lesion 11/20/2014   Overweight(278.02) 07/01/2010   Peripheral neuropathy 08/18/2016   Personal history of other infectious and parasitic disease 03/17/2010   Preventative health care 08/23/2016   Skin lesion of right arm 11/20/2014   Sleep apnea    uses mouthpiece only   Thrombocytopenia (HCC) 04/05/2012   TOBACCO ABUSE, HX OF 03/17/2010   Tubular adenoma of colon 06/10/2011    MEDICATIONS: Current Outpatient Medications on File Prior to Visit  Medication Sig Dispense Refill   acetaminophen (TYLENOL) 500 MG tablet Take 1,000 mg by mouth as needed for moderate pain or headache.      aspirin EC 81 MG tablet Take 81 mg by mouth every evening.      chlorthalidone (HYGROTON) 25 MG tablet TAKE 1 TABLET BY MOUTH IN THE  MORNING 90 tablet 3   Cholecalciferol (VITAMIN D3) 50 MCG (2000 UT) TABS Take 2,000 Units by mouth daily.     levothyroxine (SYNTHROID) 88 MCG tablet TAKE 1 TABLET BY MOUTH DAILY  BEFORE BREAKFAST 90 tablet 3   lubiprostone (AMITIZA)  24 MCG capsule TAKE 1 CAPSULE BY MOUTH TWICE  DAILY WITH A MEAL 180 capsule 0   metoprolol succinate (TOPROL-XL) 25 MG 24 hr tablet Take 0.5 tablets (12.5 mg total) by mouth daily. May extra 1/2 tablet daily for breakthrough afib (Patient taking differently: Take 12.5-25 mg by mouth daily. May extra 1/2 tablet daily for breakthrough afib) 180 tablet 1   Multiple Vitamins-Minerals (MULTIVITAMIN WITH MINERALS) tablet Take 1 tablet by mouth daily. Mega men 50+     mupirocin ointment (BACTROBAN) 2 % Place 1 Application into the nose at bedtime as needed (via qtip). 22 g 01   pantoprazole (PROTONIX) 40 MG tablet TAKE 1 TABLET BY MOUTH DAILY 90 tablet 3   Polyethyl Glycol-Propyl Glycol (SYSTANE ULTRA OP) Place 1 drop into both eyes in the morning.     ramipril (ALTACE) 10 MG capsule TAKE 1 CAPSULE BY MOUTH TWICE  DAILY 180 capsule 3   rivaroxaban (XARELTO) 20 MG TABS tablet TAKE 1 TABLET BY MOUTH DAILY  WITH SUPPER 90 tablet 1   rosuvastatin (CRESTOR) 40 MG tablet TAKE 1 TABLET BY MOUTH IN THE  EVENING 90 tablet 3   TART CHERRY PO Take 300 mg by mouth daily.     vitamin B-12 (CYANOCOBALAMIN) 1000 MCG tablet Take 2,000 mcg by mouth every Monday, Wednesday, and Friday.     No current facility-administered medications on file prior to visit.    ALLERGIES: No Known Allergies  FAMILY HISTORY: Family History  Problem Relation Age of Onset   Alzheimer's disease Mother    Emphysema Sister        cigarettes   Cancer Sister        lung, tobacco    Heart disease Son    Coronary artery disease Brother    Heart attack Brother    Other Brother        heart problems- from fathers side   Cancer Brother 80       lung? smoker   Heart disease Brother 72       Heart disease before age 26   Cancer Sister        gyn   Heart disease Sister    Bipolar disorder Daughter    Obesity Daughter    Stroke Father    Heart disease Sister    Esophageal cancer Neg Hx    Colon cancer Neg Hx    Pancreatic cancer  Neg Hx    Stomach cancer Neg Hx       Objective:  Blood pressure 127/71, pulse 64, height 6\' 2"  (1.88 m), weight 219 lb 9.6 oz (99.6 kg), SpO2 97%. General: No acute distress.  Patient appears well-groomed.   Head:  Normocephalic/atraumatic Eyes:  Fundi examined but not visualized Neck: supple, no paraspinal tenderness, full range of motion Heart:  Regular rate and rhythm Neurological Exam: alert and oriented.  Speech fluent and not dysarthric, language intact.  Left homonymous hemianopsia.  Otherwise, CN II-XII intact. Bulk and tone normal, muscle strength 5/5 throughout.  Sensation to pinprick and vibration reduced in feet up to mid-shins  Deep tendon reflexes 2+ throughout.  Finger to nose testing intact.  Gait normal, Romberg negative.   Shon Millet, DO  CC: Danise Edge, MD

## 2023-06-30 ENCOUNTER — Ambulatory Visit: Payer: Medicare Other | Admitting: Neurology

## 2023-06-30 ENCOUNTER — Encounter: Payer: Self-pay | Admitting: Neurology

## 2023-06-30 VITALS — BP 127/71 | HR 64 | Ht 74.0 in | Wt 219.6 lb

## 2023-06-30 DIAGNOSIS — G629 Polyneuropathy, unspecified: Secondary | ICD-10-CM | POA: Diagnosis not present

## 2023-06-30 DIAGNOSIS — I63231 Cerebral infarction due to unspecified occlusion or stenosis of right carotid arteries: Secondary | ICD-10-CM

## 2023-06-30 NOTE — Patient Instructions (Signed)
Continue current management

## 2023-08-07 ENCOUNTER — Other Ambulatory Visit: Payer: Self-pay | Admitting: Internal Medicine

## 2023-08-26 NOTE — Progress Notes (Signed)
CARDIOLOGY CONSULT NOTE       Patient ID: Tanner [name] MRN: [id number] DOB/AGE: 02/22/1935 89 y.o.  Admit date: (Not on file) Referring Physician: Abner Greenspan Primary Physician: Bradd Canary, MD Primary Cardiologist: Eden Emms    HPI:  89 y.o. referred by Dr Abner Greenspan for new onset afib in 09/25/2018 .  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, hypothyroidism and OSA using a mouth piece and not CPAP CHADVASC 6 on xarelto Had [facility] 08/11/19 and again 12/05/21   Bethena Roys for neurology left homonymous hemianopsia from stroke Right ICA occlusion now post left CEA 25-Sep-2012 Dr Arbie Cookey  Leanora Ivanoff gabapentin for neuropathy On ASA for vascular dx and DOAC for PAF   Myovue normal in 07/19/19 no ischemia TTE 09/22/22 EF 60-65% mild/mod MR Mild AS mean gradient 9 mmHg peak 16.8 DVI 0.51 and AVA 1.8 cm2  Carotid 06/16/23 : CTO RICA 1-39% LICA  Wife died about in 09-25-2020 They were divorced but good friends Retired Programmer, applications. Two children   No cardiac complaints Toprol dose decreased by PA 12/12/21 for HR 55 and PR 212 msec   TTE 09/03/23 EF 65-70% mild AS mean gradient 14 mmHg mild/moderate MR   Got to officiate his grandson's wedding in [place] in November  ROS All other systems reviewed and negative except as noted above  Past Medical History:  Diagnosis Date   Allergy    seasonal- mostly spring   Anemia 02/03/2011   ASTHMA, CHILDHOOD 03/17/2010   mostly as child   BCC (basal cell carcinoma) 12/08/2011   CAROTID ARTERY OCCLUSION, WITH INFARCTION 03/17/2010   CEREBROVASCULAR ACCIDENT 03/17/2010   partial loss of peripheral vision on left-"slight improved"   CHICKENPOX, HX OF 03/17/2010   Constipation 12/08/2014   Epistaxis 09/06/2013   FATIGUE 03/17/2010   Hearing loss 11/04/2010   bilateral   HYPERKALEMIA 05/27/2010   Hyperlipidemia    Hypertension    Hypothyroid 12/08/2011   Mixed hyperlipidemia 03/17/2010   Oral lesion 11/20/2014   Overweight(278.02) 07/01/2010    Peripheral neuropathy 08/18/2016   Personal history of other infectious and parasitic disease 03/17/2010   Preventative health care 08/23/2016   Skin lesion of right arm 11/20/2014   Sleep apnea    uses mouthpiece only   Thrombocytopenia (HCC) 04/05/2012   TOBACCO ABUSE, HX OF 03/17/2010   Tubular adenoma of colon 06/10/2011    Family History  Problem Relation Age of Onset   Alzheimer's disease Mother    Emphysema Sister        cigarettes   Cancer Sister        lung, tobacco    Heart disease Son    Coronary artery disease Brother    Heart attack Brother    Other Brother        heart problems- from fathers side   Cancer Brother 15       lung? smoker   Heart disease Brother 59       Heart disease before age 43   Cancer Sister        gyn   Heart disease Sister    Bipolar disorder Daughter    Obesity Daughter    Stroke Father    Heart disease Sister    Esophageal cancer Neg Hx    Colon cancer Neg Hx    Pancreatic cancer Neg Hx    Stomach cancer Neg Hx     Social History   Socioeconomic History   Marital status:  Divorced    Spouse name: Not on file   Number of children: 3   Years of education: 51yrs   Highest education level: Bachelor's degree (e.g., BA, AB, BS)  Occupational History   Occupation: retired    Associate Professor: RETIRED  Tobacco Use   Smoking status: Former    Current packs/day: 0.00    Average packs/day: 2.0 packs/day for 20.0 years (40.0 ttl pk-yrs)    Types: Cigarettes    Start date: 07/19/1957    Quit date: 07/19/1977    Years since quitting: 46.1    Passive exposure: Never   Smokeless tobacco: Never   Tobacco comments:    Former smoker 12/12/21  Vaping Use   Vaping status: Never Used  Substance and Sexual Activity   Alcohol use: Not Currently    Alcohol/week: 10.0 standard drinks of alcohol    Types: 7 Glasses of wine, 2 Cans of beer, 1 Shots of liquor per week   Drug use: No   Sexual activity: Not Currently  Other Topics Concern   Not on file   Social History Narrative   Pt lives at home alone.    Caffeine Use: very little   Heart healthy diet, is planning exercise      Retired from Patent examiner   Left handed   Social Drivers of Health   Financial Resource Strain: Low Risk  (01/18/2023)   Overall Financial Resource Strain (CARDIA)    Difficulty of Paying Living Expenses: Not hard at all  Food Insecurity: No Food Insecurity (01/18/2023)   Hunger Vital Sign    Worried About Running Out of Food in the Last Year: Never true    Ran Out of Food in the Last Year: Never true  Transportation Needs: No Transportation Needs (01/18/2023)   PRAPARE - Administrator, Civil Service (Medical): No    Lack of Transportation (Non-Medical): No  Physical Activity: Inactive (01/18/2023)   Exercise Vital Sign    Days of Exercise per Week: 0 days    Minutes of Exercise per Session: 30 min  Stress: No Stress Concern Present (01/18/2023)   [employer] of Occupational Health - Occupational Stress Questionnaire    Feeling of Stress : Not at all  Social Connections: Moderately Integrated (01/18/2023)   Social Connection and Isolation Panel [NHANES]    Frequency of Communication with Friends and Family: More than three times a week    Frequency of Social Gatherings with Friends and Family: More than three times a week    Attends Religious Services: More than 4 times per year    Active Member of Clubs or Organizations: Yes    Attends Banker Meetings: More than 4 times per year    Marital Status: Divorced  Intimate Partner Violence: Not At Risk (09/03/2022)   Humiliation, Afraid, Rape, and Kick questionnaire    Fear of Current or Ex-Partner: No    Emotionally Abused: No    Physically Abused: No    Sexually Abused: No    Past Surgical History:  Procedure Laterality Date   CARDIOVERSION N/A 08/11/2019   Procedure: CARDIOVERSION;  Surgeon: Wendall Stade, MD;  Location: [facility] ENDOSCOPY;  Service: Cardiovascular;  Laterality:  N/A;   CARDIOVERSION N/A 12/05/2021   Procedure: CARDIOVERSION;  Surgeon: Wendall Stade, MD;  Location: [facility] ENDOSCOPY;  Service: Cardiovascular;  Laterality: N/A;   CAROTID ENDARTERECTOMY Left February 13, 2013   cea   CATARACT EXTRACTION, BILATERAL Right    Cataract, and stigmatism  COLONOSCOPY  Dec. 2014   ENDARTERECTOMY Left 02/13/2013   Procedure: ENDARTERECTOMY CAROTID;  Surgeon: Larina Earthly, MD;  Location: [facility] OR;  Service: Vascular;  Laterality: Left;   EUS N/A 08/17/2013   Procedure: UPPER ENDOSCOPIC ULTRASOUND (EUS) LINEAR;  Surgeon: Rachael Fee, MD;  Location: [facility] ENDOSCOPY;  Service: Endoscopy;  Laterality: N/A;   knee cartliage repair  1964, 1990   bilateral   PILONIDAL CYST EXCISION  1968   removed   ROTATOR CUFF REPAIR     right   TONSILLECTOMY     UPPER GI ENDOSCOPY  Jan. 2015        Physical Exam: There were no vitals taken for this visit.   Affect appropriate Healthy:  appears stated age HEENT: normal Neck supple with no adenopathy JVP normal left CEA no bruits no thyromegaly Lungs clear with no wheezing and good diaphragmatic motion Heart:  S1/S2 AS murmur, no rub, gallop or click PMI normal Abdomen: benighn, BS positve, no tenderness, no AAA no bruit.  No HSM or HJR Distal pulses intact with no bruits No edema Neuro old left visual field defect  Skin warm and dry No muscular weakness   Labs:   Lab Results  Component Value Date   WBC 5.1 06/08/2023   HGB 13.4 06/08/2023   HCT 40.3 06/08/2023   MCV 96.4 06/08/2023   PLT 162.0 06/08/2023   No results for input(s): "NA", "K", "CL", "CO2", "BUN", "CREATININE", "CALCIUM", "PROT", "BILITOT", "ALKPHOS", "ALT", "AST", "GLUCOSE" in the last 168 hours.  Invalid input(s): "LABALBU" Lab Results  Component Value Date   CKTOTAL 95 03/10/2010   CKMB 3.6 03/10/2010   TROPONINI 0.01        NO INDICATION OF MYOCARDIAL INJURY. 03/10/2010    Lab Results  Component Value Date   CHOL 156 06/08/2023   CHOL  196 03/08/2023   CHOL 158 05/21/2022   Lab Results  Component Value Date   HDL 45.80 06/08/2023   HDL 53.20 03/08/2023   HDL 52.30 05/21/2022   Lab Results  Component Value Date   LDLCALC 87 06/08/2023   LDLCALC 123 (H) 03/08/2023   LDLCALC 79 05/21/2022   Lab Results  Component Value Date   TRIG 118.0 06/08/2023   TRIG 97.0 03/08/2023   TRIG 134.0 05/21/2022   Lab Results  Component Value Date   CHOLHDL 3 06/08/2023   CHOLHDL 4 03/08/2023   CHOLHDL 3 05/21/2022   No results found for: "LDLDIRECT"    Radiology: No results found.  EKG: SR rate 56 PR 208 msec 02/17/23    ASSESSMENT AND PLAN:   1. AFib:  NSR post [facility] 08/11/19 and May 2023   continue Toprol and Xarelto 2. HTN:  Well controlled.  Continue current medications and low sodium Dash type diet.   3. AS/MR:  neither severe can update TTE  March 2026 4. Thyroid: continue current dose of synthroid TSH normal  5. HDL  Continue high dose crestor LDL at goal  6. CVA: carotids stable plavix d/c due to need for anticoagulation continue low dose ASA 81 mg Duplex 06/16/23 CTO RICA and plaque no stenosis on left   TTE March 2025 for AS/MR  F/U in a year   Signed: Charlton Haws 08/26/2023, 5:35 PM

## 2023-09-03 ENCOUNTER — Ambulatory Visit (HOSPITAL_COMMUNITY): Payer: Medicare Other | Attending: Cardiovascular Disease

## 2023-09-03 DIAGNOSIS — I340 Nonrheumatic mitral (valve) insufficiency: Secondary | ICD-10-CM | POA: Insufficient documentation

## 2023-09-03 DIAGNOSIS — I350 Nonrheumatic aortic (valve) stenosis: Secondary | ICD-10-CM | POA: Insufficient documentation

## 2023-09-03 LAB — ECHOCARDIOGRAM COMPLETE
AR max vel: 1.9 cm2
AV Area VTI: 1.92 cm2
AV Area mean vel: 1.85 cm2
AV Mean grad: 14 mm[Hg]
AV Peak grad: 25.2 mm[Hg]
Ao pk vel: 2.51 m/s
Area-P 1/2: 3.91 cm2
MV M vel: 5.2 m/s
MV Peak grad: 108.2 mm[Hg]
S' Lateral: 3.2 cm

## 2023-09-07 ENCOUNTER — Ambulatory Visit: Payer: Medicare Other | Attending: Cardiovascular Disease | Admitting: Cardiovascular Disease

## 2023-09-07 ENCOUNTER — Encounter: Payer: Self-pay | Admitting: Cardiovascular Disease

## 2023-09-07 VITALS — BP 162/72 | HR 62 | Ht 74.0 in | Wt 213.0 lb

## 2023-09-07 DIAGNOSIS — I6522 Occlusion and stenosis of left carotid artery: Secondary | ICD-10-CM

## 2023-09-07 DIAGNOSIS — I350 Nonrheumatic aortic (valve) stenosis: Secondary | ICD-10-CM | POA: Diagnosis not present

## 2023-09-07 DIAGNOSIS — I480 Paroxysmal atrial fibrillation: Secondary | ICD-10-CM

## 2023-09-07 NOTE — Patient Instructions (Addendum)
 Medication Instructions:  Your physician recommends that you continue on your current medications as directed. Please refer to the Current Medication list given to you today.  *If you need a refill on your cardiac medications before your next appointment, please call your pharmacy*  Lab Work: If you have labs (blood work) drawn today and your tests are completely normal, you will receive your results only by: MyChart Message (if you have MyChart) OR A paper copy in the mail If you have any lab test that is abnormal or we need to change your treatment, we will call you to review the results.  Testing/Procedures: None ordered today.  Follow-Up: At [facility], you and your health needs are our priority.  As part of our continuing mission to provide you with exceptional heart care, we have created designated Provider Care Teams.  These Care Teams include your primary Cardiologist (physician) and Advanced Practice Providers (APPs -  Physician Assistants and Nurse Practitioners) who all work together to provide you with the care you need, when you need it.  We recommend signing up for the patient portal called "MyChart".  Sign up information is provided on this After Visit Summary.  MyChart is used to connect with patients for Virtual Visits (Telemedicine).  Patients are able to view lab/test results, encounter notes, upcoming appointments, etc.  Non-urgent messages can be sent to your provider as well.   To learn more about what you can do with MyChart, go to [email or web address].    Your next appointment:   6 month(s)  Provider:   Charlton Haws, MD     Other Instructions    1st Floor: - Lobby - Registration  - Pharmacy  - Lab - Cafe  2nd Floor: - PV Lab - Diagnostic Testing (echo, CT, nuclear med)  3rd Floor: - Vacant  4th Floor: - TCTS (cardiothoracic surgery) - AFib Clinic - Structural Heart Clinic - Vascular Surgery  - Vascular Ultrasound  5th Floor: -  HeartCare Cardiology (general and EP) - Clinical Pharmacy for coumadin, hypertension, lipid, weight-loss medications, and med management appointments    Valet parking services will be available as well.

## 2023-09-15 ENCOUNTER — Other Ambulatory Visit (INDEPENDENT_AMBULATORY_CARE_PROVIDER_SITE_OTHER): Payer: Medicare Other

## 2023-09-15 DIAGNOSIS — E039 Hypothyroidism, unspecified: Secondary | ICD-10-CM

## 2023-09-15 DIAGNOSIS — E782 Mixed hyperlipidemia: Secondary | ICD-10-CM

## 2023-09-15 DIAGNOSIS — R7989 Other specified abnormal findings of blood chemistry: Secondary | ICD-10-CM

## 2023-09-15 DIAGNOSIS — R739 Hyperglycemia, unspecified: Secondary | ICD-10-CM | POA: Diagnosis not present

## 2023-09-15 DIAGNOSIS — I10 Essential (primary) hypertension: Secondary | ICD-10-CM | POA: Diagnosis not present

## 2023-09-15 DIAGNOSIS — E538 Deficiency of other specified B group vitamins: Secondary | ICD-10-CM | POA: Diagnosis not present

## 2023-09-15 LAB — COMPREHENSIVE METABOLIC PANEL
ALT: 11 U/L (ref 0–53)
AST: 13 U/L (ref 0–37)
Albumin: 4.4 g/dL (ref 3.5–5.2)
Alkaline Phosphatase: 62 U/L (ref 39–117)
BUN: 26 mg/dL — ABNORMAL HIGH (ref 6–23)
CO2: 28 meq/L (ref 19–32)
Calcium: 9.1 mg/dL (ref 8.4–10.5)
Chloride: 102 meq/L (ref 96–112)
Creatinine, Ser: 1.35 mg/dL (ref 0.40–1.50)
GFR: 47.49 mL/min — ABNORMAL LOW (ref >60.00)
Glucose, Bld: 92 mg/dL (ref 70–99)
Potassium: 4.3 meq/L (ref 3.5–5.1)
Sodium: 139 meq/L (ref 135–145)
Total Bilirubin: 0.7 mg/dL (ref 0.2–1.2)
Total Protein: 7.4 g/dL (ref 6.0–8.3)

## 2023-09-15 LAB — CBC WITH DIFFERENTIAL/PLATELET
Basophils Absolute: 0 10*3/uL (ref 0.0–0.1)
Basophils Relative: 0.8 % (ref 0.0–3.0)
Eosinophils Absolute: 0.3 10*3/uL (ref 0.0–0.7)
Eosinophils Relative: 5.9 % — ABNORMAL HIGH (ref 0.0–5.0)
HCT: 39.7 % (ref 39.0–52.0)
Hemoglobin: 13.3 g/dL (ref 13.0–17.0)
Lymphocytes Relative: 33.5 % (ref 12.0–46.0)
Lymphs Abs: 1.9 10*3/uL (ref 0.7–4.0)
MCHC: 33.5 g/dL (ref 30.0–36.0)
MCV: 97.6 fL (ref 78.0–100.0)
Monocytes Absolute: 0.5 10*3/uL (ref 0.1–1.0)
Monocytes Relative: 9.1 % (ref 3.0–12.0)
Neutro Abs: 2.9 10*3/uL (ref 1.4–7.7)
Neutrophils Relative %: 50.7 % (ref 43.0–77.0)
Platelets: 186 10*3/uL (ref 150.0–400.0)
RBC: 4.07 Mil/uL — ABNORMAL LOW (ref 4.22–5.81)
RDW: 14.2 % (ref 11.5–15.5)
WBC: 5.7 10*3/uL (ref 4.0–10.5)

## 2023-09-15 LAB — LIPID PANEL
Cholesterol: 160 mg/dL (ref 0–200)
HDL: 55.2 mg/dL (ref >39.00)
LDL Cholesterol: 88 mg/dL (ref 0–99)
NonHDL: 105.1
Total CHOL/HDL Ratio: 3
Triglycerides: 88 mg/dL (ref 0.0–149.0)
VLDL: 17.6 mg/dL (ref 0.0–40.0)

## 2023-09-15 LAB — VITAMIN B12: Vitamin B-12: 602 pg/mL (ref 211–911)

## 2023-09-15 LAB — TSH: TSH: 1.65 u[IU]/mL (ref 0.35–5.50)

## 2023-09-15 LAB — HEMOGLOBIN A1C: Hgb A1c MFr Bld: 5.6 % (ref 4.6–6.5)

## 2023-09-15 LAB — VITAMIN D 25 HYDROXY (VIT D DEFICIENCY, FRACTURES): VITD: 52.78 ng/mL (ref 30.00–100.00)

## 2023-09-16 ENCOUNTER — Encounter: Payer: Self-pay | Admitting: Family Medicine

## 2023-09-19 NOTE — Assessment & Plan Note (Signed)
 Supplement and monitor

## 2023-09-19 NOTE — Assessment & Plan Note (Signed)
 Tolerating statin, encouraged heart healthy diet, avoid trans fats, minimize simple carbs and saturated fats. Increase exercise as tolerated

## 2023-09-19 NOTE — Assessment & Plan Note (Signed)
 On Levothyroxine, continue to monitor

## 2023-09-19 NOTE — Assessment & Plan Note (Signed)
 [id number] acceptable, minimize simple carbs. Increase exercise as tolerated.

## 2023-09-19 NOTE — Assessment & Plan Note (Signed)
Had a recent episode but recovered spontaneously is tolerating Xarelto

## 2023-09-19 NOTE — Assessment & Plan Note (Signed)
 Well controlled, no changes to meds. Encouraged heart healthy diet such as the DASH diet and exercise as tolerated.

## 2023-09-20 ENCOUNTER — Ambulatory Visit: Payer: Medicare Other | Admitting: Family Medicine

## 2023-09-20 ENCOUNTER — Encounter: Payer: Self-pay | Admitting: Family Medicine

## 2023-09-20 ENCOUNTER — Other Ambulatory Visit (HOSPITAL_BASED_OUTPATIENT_CLINIC_OR_DEPARTMENT_OTHER): Payer: Self-pay

## 2023-09-20 VITALS — BP 132/74 | HR 61 | Temp 97.6°F | Resp 16 | Ht 74.0 in | Wt 217.8 lb

## 2023-09-20 DIAGNOSIS — E782 Mixed hyperlipidemia: Secondary | ICD-10-CM

## 2023-09-20 DIAGNOSIS — E039 Hypothyroidism, unspecified: Secondary | ICD-10-CM | POA: Diagnosis not present

## 2023-09-20 DIAGNOSIS — R739 Hyperglycemia, unspecified: Secondary | ICD-10-CM

## 2023-09-20 DIAGNOSIS — I4819 Other persistent atrial fibrillation: Secondary | ICD-10-CM

## 2023-09-20 DIAGNOSIS — R7989 Other specified abnormal findings of blood chemistry: Secondary | ICD-10-CM

## 2023-09-20 DIAGNOSIS — I10 Essential (primary) hypertension: Secondary | ICD-10-CM

## 2023-09-20 MED ORDER — BOOSTRIX 5-2.5-18.5 LF-MCG/0.5 IM SUSY
0.5000 mL | PREFILLED_SYRINGE | Freq: Once | INTRAMUSCULAR | 0 refills | Status: AC
  Filled 2023-09-20: qty 0.5, 1d supply, fill #0

## 2023-09-20 MED ORDER — PREVNAR 20 0.5 ML IM SUSY
0.5000 mL | PREFILLED_SYRINGE | Freq: Once | INTRAMUSCULAR | 0 refills | Status: AC
  Filled 2023-09-20: qty 0.5, 1d supply, fill #0

## 2023-09-20 NOTE — Patient Instructions (Addendum)
 Prevnar 20 at pharmacy Tetanus booster at pharmacy  Chronic Kidney Disease in Adults: What to Know Chronic kidney disease (CKD) is when lasting damage happens to the kidneys slowly over time. The kidneys are two organs that do many important things in the body. These include: Taking waste and extra fluid out of the blood to make pee (urine). Making hormones. Keeping the right amount of fluids and chemicals in the body. A small amount of kidney damage may not cause problems. You must take steps to help keep the kidney damage from getting worse. A lot of damage may cause kidney failure. Kidney failure means the kidneys can no longer work right. What are the causes? Diabetes. High blood pressure. Diseases that affect the heart and blood vessels. Other kidney diseases. Diseases that affect the body's defense system (immune system). A problem with the flow of pee. This may be caused by: Kidney stones. Cancer. An enlarged prostate, in males. A kidney infection or urinary tract infection (UTI) that keeps coming back. What increases the risk? Getting older. The chances of having CKD increase with age. A family history of kidney disease or kidney failure. Having a disease caused by genes. Taking medicines that can harm the kidneys. Being near or having contact with harmful substances. Being very overweight. Using tobacco now or in the past. What are the signs or symptoms? Common symptoms of CKD include: Feeling very tired and having less energy. Swelling of the face, legs, ankles, or feet. Throwing up or feeling like you may throw up. Not wanting to eat as much as normal. Being confused or not able to focus. Twitches and cramps in the leg muscles or other muscles. Dry, itchy skin. Other symptoms may include: Shortness of breath. Trouble sleeping. Making less pee, or making more pee, especially at night. A taste of metal in your mouth. You may also become anemic. Anemia means there's  not enough red blood cells in your blood. You may get symptoms slowly. You may not notice them until the kidney damage gets very bad. How is this diagnosed? CKD may be diagnosed based on: Tests on your blood or pee. Imaging tests, like an ultrasound or a CT scan. A kidney biopsy. For this test, a sample of kidney tissue is removed to be looked at under a microscope. These tests will help to find out how serious the CKD is. How is this treated? Often, there's no cure for CKD. Treatment can help with symptoms and help keep the disease from getting worse. Treatment may include: Treating other problems that are causing your CKD or making it worse. Diet changes. You may need to: Avoid alcohol. Avoid foods that are high in salt, potassium, phosphorous, and protein. Taking medicines for symptoms and to help control other conditions. Dialysis. This treatment gets harmful waste out of your body. It may be needed if you have kidney failure. Follow these instructions at home: Medicines Take your medicines only as told. The amount of some medicines you take may need to be changed. Do not take any new medicines, vitamins, or supplements unless your health care provider says it's okay. These may make kidney damage worse. Lifestyle Do not smoke, vape, or use nicotine or tobacco. If you drink alcohol: Limit how much you have to: 0-1 drink a day if you're male. 0-2 drinks a day if you're male. Know how much alcohol is in your drink. In the [place] one drink is one 12 oz bottle of beer (355 mL), one 5 oz  glass of wine (148 mL), or one 1 oz glass of hard liquor (44 mL). Stay at a healthy weight. If you need help, ask your provider. General instructions  Eat and drink as told. Track your blood pressure at home. Tell your provider about any changes. If you have diabetes, track your blood sugar as told. Exercise at least 30 minutes a day, 5 days a week. Keep your shots (vaccinations) up to date. Keep  all follow-up visits. Your provider may need to change your treatments over time. Where to find support American Kidney Fund: [place].com.au Kidney School: kidneyschool.org American Association of Kidney Patients: https://www.miller-montoya.com/ Where to find more information National Kidney Foundation: kidney.org Centers for Disease Control and Prevention. To learn more: Go to DiningCalendar.de. Click "Search". Type "chronic kidney disease" in the search box. Contact a health care provider if: You have new symptoms. You get symptoms of end-stage kidney disease. These include: Headaches. Numbness in your hands or feet. Leg cramps. Easy bruising. Get help right away if: You have a fever. You make less pee than usual. You have pain or bleeding when you pee or poop. You have chest pain. You have shortness of breath. These symptoms may be an emergency. Call 911 right away. Do not wait to see if the symptoms will go away. Do not drive yourself to the hospital. This information is not intended to replace advice given to you by your health care provider. Make sure you discuss any questions you have with your health care provider. Document Revised: 05/18/2023 Document Reviewed: 01/08/2023 Elsevier Patient Education  2024 [employer].

## 2023-09-20 NOTE — Progress Notes (Signed)
 Subjective:    Patient ID: Tanner [name], male    DOB: [birth year], 89 y.o.   MRN: [id number]  Chief Complaint  Patient presents with   Follow-up    HPI Discussed the use of AI scribe software for clinical note transcription with the patient, who gave verbal consent to proceed.  History of Present Illness Tanner [name] "Tanner [name]" is a 89 year old male with kidney dysfunction who presents with concerns about recent blood work results.  He is concerned about his recent blood work results, particularly regarding his kidney function. His BUN and GFR levels indicate that his kidneys are not filtering blood as efficiently as before. He has been taking ramipril and chlorthalidone for a long time. He feels more tired than he did a year ago, describing it as an overall tiredness rather than being winded.  He has been donating blood and has been advised to be cautious about donating red blood cells due to his current levels. His hemoglobin and hematocrit levels are fine, but his red blood cells are slightly low.  He has a history of allergies, which is reflected in his slightly elevated eosinophil count. He is not overly concerned about this as it has been a consistent finding over the years.    Past Medical History:  Diagnosis Date   Allergy    seasonal- mostly spring   Anemia 02/03/2011   ASTHMA, CHILDHOOD 03/17/2010   mostly as child   BCC (basal cell carcinoma) 12/08/2011   CAROTID ARTERY OCCLUSION, WITH INFARCTION 03/17/2010   CEREBROVASCULAR ACCIDENT 03/17/2010   partial loss of peripheral vision on left-"slight improved"   CHICKENPOX, HX OF 03/17/2010   Constipation 12/08/2014   Epistaxis 09/06/2013   FATIGUE 03/17/2010   Hearing loss 11/04/2010   bilateral   HYPERKALEMIA 05/27/2010   Hyperlipidemia    Hypertension    Hypothyroid 12/08/2011   Mixed hyperlipidemia 03/17/2010   Oral lesion 11/20/2014   Overweight(278.02) 07/01/2010   Peripheral neuropathy 08/18/2016   Personal history  of other infectious and parasitic disease 03/17/2010   Preventative health care 08/23/2016   Skin lesion of right arm 11/20/2014   Sleep apnea    uses mouthpiece only   Thrombocytopenia (HCC) 04/05/2012   TOBACCO ABUSE, HX OF 03/17/2010   Tubular adenoma of colon 06/10/2011    Past Surgical History:  Procedure Laterality Date   CARDIOVERSION N/A 08/11/2019   Procedure: CARDIOVERSION;  Surgeon: Wendall Stade, MD;  Location: [facility] ENDOSCOPY;  Service: Cardiovascular;  Laterality: N/A;   CARDIOVERSION N/A 12/05/2021   Procedure: CARDIOVERSION;  Surgeon: Wendall Stade, MD;  Location: [facility] ENDOSCOPY;  Service: Cardiovascular;  Laterality: N/A;   CAROTID ENDARTERECTOMY Left February 13, 2013   cea   CATARACT EXTRACTION, BILATERAL Right    Cataract, and stigmatism   COLONOSCOPY  Dec. 2014   ENDARTERECTOMY Left 02/13/2013   Procedure: ENDARTERECTOMY CAROTID;  Surgeon: Larina Earthly, MD;  Location: [facility] OR;  Service: Vascular;  Laterality: Left;   EUS N/A 08/17/2013   Procedure: UPPER ENDOSCOPIC ULTRASOUND (EUS) LINEAR;  Surgeon: Rachael Fee, MD;  Location: [facility] ENDOSCOPY;  Service: Endoscopy;  Laterality: N/A;   knee cartliage repair  1964, 1990   bilateral   PILONIDAL CYST EXCISION  1968   removed   ROTATOR CUFF REPAIR     right   TONSILLECTOMY     UPPER GI ENDOSCOPY  Jan. 2015    Family History  Problem Relation Age of Onset   Alzheimer's disease Mother  Emphysema Sister        cigarettes   Cancer Sister        lung, tobacco    Heart disease Son    Coronary artery disease Brother    Heart attack Brother    Other Brother        heart problems- from fathers side   Cancer Brother 62       lung? smoker   Heart disease Brother 69       Heart disease before age 51   Cancer Sister        gyn   Heart disease Sister    Bipolar disorder Daughter    Obesity Daughter    Stroke Father    Heart disease Sister    Esophageal cancer Neg Hx    Colon cancer Neg Hx    Pancreatic cancer Neg Hx     Stomach cancer Neg Hx     Social History   Socioeconomic History   Marital status: Divorced    Spouse name: Not on file   Number of children: 3   Years of education: 81yrs   Highest education level: Bachelor's degree (e.g., BA, AB, BS)  Occupational History   Occupation: retired    Associate Professor: RETIRED  Tobacco Use   Smoking status: Former    Current packs/day: 0.00    Average packs/day: 2.0 packs/day for 20.0 years (40.0 ttl pk-yrs)    Types: Cigarettes    Start date: 07/19/1957    Quit date: 07/19/1977    Years since quitting: 46.2    Passive exposure: Never   Smokeless tobacco: Never   Tobacco comments:    Former smoker 12/12/21  Vaping Use   Vaping status: Never Used  Substance and Sexual Activity   Alcohol use: Not Currently    Alcohol/week: 10.0 standard drinks of alcohol    Types: 7 Glasses of wine, 2 Cans of beer, 1 Shots of liquor per week   Drug use: No   Sexual activity: Not Currently  Other Topics Concern   Not on file  Social History Narrative   Pt lives at home alone.    Caffeine Use: very little   Heart healthy diet, is planning exercise      Retired from Patent examiner   Left handed   Social Drivers of Health   Financial Resource Strain: Low Risk  (09/13/2023)   Overall Financial Resource Strain (CARDIA)    Difficulty of Paying Living Expenses: Not hard at all  Food Insecurity: No Food Insecurity (09/13/2023)   Hunger Vital Sign    Worried About Running Out of Food in the Last Year: Never true    Ran Out of Food in the Last Year: Never true  Transportation Needs: No Transportation Needs (09/13/2023)   PRAPARE - Administrator, Civil Service (Medical): No    Lack of Transportation (Non-Medical): No  Physical Activity: Insufficiently Active (09/13/2023)   Exercise Vital Sign    Days of Exercise per Week: 4 days    Minutes of Exercise per Session: 30 min  Stress: No Stress Concern Present (09/13/2023)   [employer] of Occupational  Health - Occupational Stress Questionnaire    Feeling of Stress : Not at all  Social Connections: Moderately Integrated (09/13/2023)   Social Connection and Isolation Panel [NHANES]    Frequency of Communication with Friends and Family: More than three times a week    Frequency of Social Gatherings with Friends and Family: More than three times  a week    Attends Religious Services: More than 4 times per year    Active Member of Clubs or Organizations: Yes    Attends Banker Meetings: 1 to 4 times per year    Marital Status: Divorced  Intimate Partner Violence: Not At Risk (09/03/2022)   Humiliation, Afraid, Rape, and Kick questionnaire    Fear of Current or Ex-Partner: No    Emotionally Abused: No    Physically Abused: No    Sexually Abused: No    Outpatient Medications Prior to Visit  Medication Sig Dispense Refill   acetaminophen (TYLENOL) 500 MG tablet Take 1,000 mg by mouth as needed for moderate pain or headache.      aspirin EC 81 MG tablet Take 81 mg by mouth every evening.      chlorthalidone (HYGROTON) 25 MG tablet TAKE 1 TABLET BY MOUTH IN THE  MORNING 90 tablet 3   Cholecalciferol (VITAMIN D3) 50 MCG (2000 UT) TABS Take 2,000 Units by mouth daily.     levothyroxine (SYNTHROID) 88 MCG tablet TAKE 1 TABLET BY MOUTH DAILY  BEFORE BREAKFAST 90 tablet 3   lubiprostone (AMITIZA) 24 MCG capsule TAKE 1 CAPSULE BY MOUTH TWICE  DAILY WITH A MEAL 180 capsule 0   metoprolol succinate (TOPROL-XL) 25 MG 24 hr tablet Take 0.5 tablets (12.5 mg total) by mouth daily. May extra 1/2 tablet daily for breakthrough afib (Patient taking differently: Take 12.5-25 mg by mouth daily. May extra 1/2 tablet daily for breakthrough afib) 180 tablet 1   Multiple Vitamins-Minerals (MULTIVITAMIN WITH MINERALS) tablet Take 1 tablet by mouth daily. Mega men 50+     mupirocin ointment (BACTROBAN) 2 % Place 1 Application into the nose at bedtime as needed (via qtip). 22 g 01   pantoprazole (PROTONIX) 40  MG tablet TAKE 1 TABLET BY MOUTH DAILY 90 tablet 3   Polyethyl Glycol-Propyl Glycol (SYSTANE ULTRA OP) Place 1 drop into both eyes in the morning.     ramipril (ALTACE) 10 MG capsule TAKE 1 CAPSULE BY MOUTH TWICE  DAILY 180 capsule 3   rivaroxaban (XARELTO) 20 MG TABS tablet TAKE 1 TABLET BY MOUTH DAILY  WITH SUPPER 90 tablet 1   rosuvastatin (CRESTOR) 40 MG tablet TAKE 1 TABLET BY MOUTH IN THE  EVENING 90 tablet 3   TART CHERRY PO Take 300 mg by mouth daily.     vitamin B-12 (CYANOCOBALAMIN) 1000 MCG tablet Take 2,000 mcg by mouth every Monday, Wednesday, and Friday.     No facility-administered medications prior to visit.    No Known Allergies  Review of Systems  Constitutional:  Positive for malaise/fatigue. Negative for fever.  HENT:  Negative for congestion.   Eyes:  Negative for blurred vision.  Respiratory:  Negative for shortness of breath.   Cardiovascular:  Negative for chest pain, palpitations and leg swelling.  Gastrointestinal:  Negative for abdominal pain, blood in stool and nausea.  Genitourinary:  Negative for dysuria and frequency.  Musculoskeletal:  Negative for falls.  Skin:  Negative for rash.  Neurological:  Negative for dizziness, loss of consciousness and headaches.  Endo/Heme/Allergies:  Negative for environmental allergies.  Psychiatric/Behavioral:  Negative for depression. The patient is not nervous/anxious.        Objective:    Physical Exam Vitals reviewed.  Constitutional:      Appearance: Normal appearance. He is not ill-appearing.  HENT:     Head: Normocephalic and atraumatic.     Nose: Nose normal.  Eyes:  Conjunctiva/sclera: Conjunctivae normal.  Cardiovascular:     Rate and Rhythm: Normal rate.     Pulses: Normal pulses.     Heart sounds: Murmur heard.  Pulmonary:     Effort: Pulmonary effort is normal.     Breath sounds: Normal breath sounds. No wheezing.  Abdominal:     Palpations: Abdomen is soft. There is no mass.      Tenderness: There is no abdominal tenderness.  Musculoskeletal:     Cervical back: Normal range of motion.     Right lower leg: No edema.     Left lower leg: No edema.  Skin:    General: Skin is warm and dry.  Neurological:     General: No focal deficit present.     Mental Status: He is alert and oriented to person, place, and time.  Psychiatric:        Mood and Affect: Mood normal.     BP 132/74 (BP Location: Left Arm, Patient Position: Sitting, Cuff Size: Normal)   Pulse 61   Temp 97.6 F (36.4 C) (Oral)   Resp 16   Ht 6\' 2"  (1.88 m)   Wt 217 lb 12.8 oz (98.8 kg)   SpO2 99%   BMI 27.96 kg/m  Wt Readings from Last 3 Encounters:  09/20/23 217 lb 12.8 oz (98.8 kg)  09/07/23 213 lb (96.6 kg)  06/30/23 219 lb 9.6 oz (99.6 kg)    Diabetic Foot Exam - Simple   No data filed    Lab Results  Component Value Date   WBC 5.7 09/15/2023   HGB 13.3 09/15/2023   HCT 39.7 09/15/2023   PLT 186.0 09/15/2023   GLUCOSE 92 09/15/2023   CHOL 160 09/15/2023   TRIG 88.0 09/15/2023   HDL 55.20 09/15/2023   LDLCALC 88 09/15/2023   ALT 11 09/15/2023   AST 13 09/15/2023   NA 139 09/15/2023   K 4.3 09/15/2023   CL 102 09/15/2023   CREATININE 1.35 09/15/2023   BUN 26 (H) 09/15/2023   CO2 28 09/15/2023   TSH 1.65 09/15/2023   PSA 0.48 03/08/2023   INR 0.99 02/07/2013   HGBA1C 5.6 09/15/2023   MICROALBUR 2.0 (H) 12/03/2015    Lab Results  Component Value Date   TSH 1.65 09/15/2023   Lab Results  Component Value Date   WBC 5.7 09/15/2023   HGB 13.3 09/15/2023   HCT 39.7 09/15/2023   MCV 97.6 09/15/2023   PLT 186.0 09/15/2023   Lab Results  Component Value Date   NA 139 09/15/2023   K 4.3 09/15/2023   CO2 28 09/15/2023   GLUCOSE 92 09/15/2023   BUN 26 (H) 09/15/2023   CREATININE 1.35 09/15/2023   BILITOT 0.7 09/15/2023   ALKPHOS 62 09/15/2023   AST 13 09/15/2023   ALT 11 09/15/2023   PROT 7.4 09/15/2023   ALBUMIN 4.4 09/15/2023   CALCIUM 9.1 09/15/2023    ANIONGAP 9 01/13/2023   GFR 47.49 (L) 09/15/2023   Lab Results  Component Value Date   CHOL 160 09/15/2023   Lab Results  Component Value Date   HDL 55.20 09/15/2023   Lab Results  Component Value Date   LDLCALC 88 09/15/2023   Lab Results  Component Value Date   TRIG 88.0 09/15/2023   Lab Results  Component Value Date   CHOLHDL 3 09/15/2023   Lab Results  Component Value Date   HGBA1C 5.6 09/15/2023       Assessment & Plan:  Essential  hypertension, benign Assessment & Plan: Well controlled, no changes to meds. Encouraged heart healthy diet such as the DASH diet and exercise as tolerated.    Orders: -     CBC with Differential/Platelet; Future -     Comprehensive metabolic panel; Future -     TSH; Future -     Comprehensive metabolic panel; Future -     CBC with Differential/Platelet; Future  Hyperglycemia Assessment & Plan: hgba1c acceptable, minimize simple carbs. Increase exercise as tolerated.   Orders: -     Hemoglobin A1c; Future  Hypothyroidism, unspecified type Assessment & Plan: On Levothyroxine, continue to monitor   Orders: -     TSH; Future  Low vitamin D level Assessment & Plan: Supplement and monitor   Orders: -     VITAMIN D 25 Hydroxy (Vit-D Deficiency, Fractures); Future  Mixed hyperlipidemia Assessment & Plan: Tolerating statin, encouraged heart healthy diet, avoid trans fats, minimize simple carbs and saturated fats. Increase exercise as tolerated  Orders: -     Lipid panel; Future  Persistent atrial fibrillation (HCC) Assessment & Plan: Had a recent episode but recovered spontaneously is tolerating Xarelto     Assessment and Plan Assessment & Plan Chronic Kidney Disease BUN and GFR indicate mild kidney dysfunction. Discussed the importance of hydration, weight control, blood pressure control, and blood sugar control to slow the progression of kidney disease. -Continue to hydrate well with 10 ounces of non-caffeinated,  non-alcoholic fluids every 1-2 hours. -Check kidney function every 3-4 months. -Check CMP and CBC in 1 month.  Hypertension Currently managed with Ramipril and Chlorthalidone. Discussed the potential need to adjust medications if kidney function continues to decline. -Continue Ramipril and Chlorthalidone. -Consider switching from Chlorthalidone to Hydralazine if kidney function declines further.  General Health Maintenance -Continue annual COVID-19 vaccinations. -Get Tetanus booster at pharmacy. -Get Prevnar 20 vaccination at pharmacy. -Follow-up visit in 6 months with blood work to be done after September 1st.     Danise Edge, MD

## 2023-09-23 ENCOUNTER — Ambulatory Visit: Payer: Medicare Other

## 2023-09-23 VITALS — Ht 74.4 in | Wt 217.0 lb

## 2023-09-23 DIAGNOSIS — Z0000 Encounter for general adult medical examination without abnormal findings: Secondary | ICD-10-CM | POA: Diagnosis not present

## 2023-09-23 NOTE — Progress Notes (Signed)
 Subjective:   Tanner [name] is a 89 y.o. male who presents for Medicare Annual/Subsequent preventive examination.  Visit Complete: Virtual I connected with  Tanner [name] on 09/23/23 by a audio enabled telemedicine application and verified that I am speaking with the correct person using two identifiers.  Patient Location: Home  Provider Location: Home Office  I discussed the limitations of evaluation and management by telemedicine. The patient expressed understanding and agreed to proceed.  Vital Signs: Because this visit was a virtual/telehealth visit, some criteria may be missing or patient reported. Any vitals not documented were not able to be obtained and vitals that have been documented are patient reported.  Patient Medicare AWV questionnaire was completed by the patient on 09/16/23; I have confirmed that all information answered by patient is correct and no changes since this date.  Cardiac Risk Factors include: advanced age (>52men, >90 women);male gender;hypertension     Objective:    Today's Vitals   09/23/23 0934  Weight: 217 lb (98.4 kg)  Height: 6' 2.4" (1.89 m)   Body mass index is 27.56 kg/m.     09/23/2023    9:43 AM 09/03/2022    3:40 PM 06/16/2022    8:57 AM 12/05/2021   11:04 AM 09/01/2021    1:47 PM 06/10/2021    8:59 AM 07/30/2020    2:22 PM  Advanced Directives  Does Patient Have a Medical Advance Directive? Yes Yes Yes Yes Yes No No  Type of Estate agent of [place];Living will Healthcare Power of [place];Living will Healthcare Power of [place];Living will Healthcare Power of [place];Living will Healthcare Power of [place];Living will    Does patient want to make changes to medical advance directive? No - Patient declined No - Patient declined       Copy of Healthcare Power of Attorney in Chart? Yes - validated most recent copy scanned in chart (See row information) Yes - validated most recent copy scanned in chart (See  row information)  Yes - validated most recent copy scanned in chart (See row information) Yes - validated most recent copy scanned in chart (See row information)    Would patient like information on creating a medical advance directive?       No - Patient declined    Current Medications (verified) Outpatient Encounter Medications as of 09/23/2023  Medication Sig   acetaminophen (TYLENOL) 500 MG tablet Take 1,000 mg by mouth as needed for moderate pain or headache.    aspirin EC 81 MG tablet Take 81 mg by mouth every evening.    chlorthalidone (HYGROTON) 25 MG tablet TAKE 1 TABLET BY MOUTH IN THE  MORNING   Cholecalciferol (VITAMIN D3) 50 MCG (2000 UT) TABS Take 2,000 Units by mouth daily.   levothyroxine (SYNTHROID) 88 MCG tablet TAKE 1 TABLET BY MOUTH DAILY  BEFORE BREAKFAST   lubiprostone (AMITIZA) 24 MCG capsule TAKE 1 CAPSULE BY MOUTH TWICE  DAILY WITH A MEAL   metoprolol succinate (TOPROL-XL) 25 MG 24 hr tablet Take 0.5 tablets (12.5 mg total) by mouth daily. May extra 1/2 tablet daily for breakthrough afib (Patient taking differently: Take 12.5-25 mg by mouth daily. May extra 1/2 tablet daily for breakthrough afib)   Multiple Vitamins-Minerals (MULTIVITAMIN WITH MINERALS) tablet Take 1 tablet by mouth daily. Mega men 50+   mupirocin ointment (BACTROBAN) 2 % Place 1 Application into the nose at bedtime as needed (via qtip).   pantoprazole (PROTONIX) 40 MG tablet TAKE 1 TABLET BY MOUTH DAILY  Polyethyl Glycol-Propyl Glycol (SYSTANE ULTRA OP) Place 1 drop into both eyes in the morning.   ramipril (ALTACE) 10 MG capsule TAKE 1 CAPSULE BY MOUTH TWICE  DAILY   rivaroxaban (XARELTO) 20 MG TABS tablet TAKE 1 TABLET BY MOUTH DAILY  WITH SUPPER   rosuvastatin (CRESTOR) 40 MG tablet TAKE 1 TABLET BY MOUTH IN THE  EVENING   TART CHERRY PO Take 300 mg by mouth daily.   vitamin B-12 (CYANOCOBALAMIN) 1000 MCG tablet Take 2,000 mcg by mouth every Monday, Wednesday, and Friday.   No facility-administered  encounter medications on file as of 09/23/2023.    Allergies (verified) Patient has no known allergies.   History: Past Medical History:  Diagnosis Date   Allergy    seasonal- mostly spring   Anemia 02/03/2011   ASTHMA, CHILDHOOD 03/17/2010   mostly as child   BCC (basal cell carcinoma) 12/08/2011   CAROTID ARTERY OCCLUSION, WITH INFARCTION 03/17/2010   CEREBROVASCULAR ACCIDENT 03/17/2010   partial loss of peripheral vision on left-"slight improved"   CHICKENPOX, HX OF 03/17/2010   Constipation 12/08/2014   Epistaxis 09/06/2013   FATIGUE 03/17/2010   Hearing loss 11/04/2010   bilateral   HYPERKALEMIA 05/27/2010   Hyperlipidemia    Hypertension    Hypothyroid 12/08/2011   Mixed hyperlipidemia 03/17/2010   Oral lesion 11/20/2014   Overweight(278.02) 07/01/2010   Peripheral neuropathy 08/18/2016   Personal history of other infectious and parasitic disease 03/17/2010   Preventative health care 08/23/2016   Skin lesion of right arm 11/20/2014   Sleep apnea    uses mouthpiece only   Thrombocytopenia (HCC) 04/05/2012   TOBACCO ABUSE, HX OF 03/17/2010   Tubular adenoma of colon 06/10/2011   Past Surgical History:  Procedure Laterality Date   CARDIOVERSION N/A 08/11/2019   Procedure: CARDIOVERSION;  Surgeon: Wendall Stade, MD;  Location: [facility] ENDOSCOPY;  Service: Cardiovascular;  Laterality: N/A;   CARDIOVERSION N/A 12/05/2021   Procedure: CARDIOVERSION;  Surgeon: Wendall Stade, MD;  Location: [facility] ENDOSCOPY;  Service: Cardiovascular;  Laterality: N/A;   CAROTID ENDARTERECTOMY Left February 13, 2013   cea   CATARACT EXTRACTION, BILATERAL Right    Cataract, and stigmatism   COLONOSCOPY  Dec. 2014   ENDARTERECTOMY Left 02/13/2013   Procedure: ENDARTERECTOMY CAROTID;  Surgeon: Larina Earthly, MD;  Location: [facility] OR;  Service: Vascular;  Laterality: Left;   EUS N/A 08/17/2013   Procedure: UPPER ENDOSCOPIC ULTRASOUND (EUS) LINEAR;  Surgeon: Rachael Fee, MD;  Location: [facility] ENDOSCOPY;  Service: Endoscopy;   Laterality: N/A;   knee cartliage repair  1964, 1990   bilateral   PILONIDAL CYST EXCISION  1968   removed   ROTATOR CUFF REPAIR     right   TONSILLECTOMY     UPPER GI ENDOSCOPY  Jan. 2015   Family History  Problem Relation Age of Onset   Alzheimer's disease Mother    Emphysema Sister        cigarettes   Cancer Sister        lung, tobacco    Heart disease Son    Coronary artery disease Brother    Heart attack Brother    Other Brother        heart problems- from fathers side   Cancer Brother 23       lung? smoker   Heart disease Brother 82       Heart disease before age 69   Cancer Sister        gyn  Heart disease Sister    Bipolar disorder Daughter    Obesity Daughter    Stroke Father    Heart disease Sister    Esophageal cancer Neg Hx    Colon cancer Neg Hx    Pancreatic cancer Neg Hx    Stomach cancer Neg Hx    Social History   Socioeconomic History   Marital status: Divorced    Spouse name: Not on file   Number of children: 3   Years of education: 55yrs   Highest education level: Bachelor's degree (e.g., BA, AB, BS)  Occupational History   Occupation: retired    Associate Professor: RETIRED  Tobacco Use   Smoking status: Former    Current packs/day: 0.00    Average packs/day: 2.0 packs/day for 20.0 years (40.0 ttl pk-yrs)    Types: Cigarettes    Start date: 07/19/1957    Quit date: 07/19/1977    Years since quitting: 46.2    Passive exposure: Never   Smokeless tobacco: Never   Tobacco comments:    Former smoker 12/12/21  Vaping Use   Vaping status: Never Used  Substance and Sexual Activity   Alcohol use: Not Currently    Alcohol/week: 10.0 standard drinks of alcohol    Types: 7 Glasses of wine, 2 Cans of beer, 1 Shots of liquor per week   Drug use: No   Sexual activity: Not Currently  Other Topics Concern   Not on file  Social History Narrative   Pt lives at home alone.    Caffeine Use: very little   Heart healthy diet, is planning exercise       Retired from Patent examiner   Left handed   Social Drivers of Health   Financial Resource Strain: Low Risk  (09/23/2023)   Overall Financial Resource Strain (CARDIA)    Difficulty of Paying Living Expenses: Not hard at all  Food Insecurity: No Food Insecurity (09/23/2023)   Hunger Vital Sign    Worried About Running Out of Food in the Last Year: Never true    Ran Out of Food in the Last Year: Never true  Transportation Needs: No Transportation Needs (09/23/2023)   PRAPARE - Administrator, Civil Service (Medical): No    Lack of Transportation (Non-Medical): No  Physical Activity: Insufficiently Active (09/23/2023)   Exercise Vital Sign    Days of Exercise per Week: 4 days    Minutes of Exercise per Session: 30 min  Stress: No Stress Concern Present (09/23/2023)   [employer] of Occupational Health - Occupational Stress Questionnaire    Feeling of Stress : Not at all  Social Connections: Moderately Integrated (09/23/2023)   Social Connection and Isolation Panel [NHANES]    Frequency of Communication with Friends and Family: More than three times a week    Frequency of Social Gatherings with Friends and Family: More than three times a week    Attends Religious Services: More than 4 times per year    Active Member of [employer] or Organizations: Yes    Attends Engineer, structural: More than 4 times per year    Marital Status: Divorced    Tobacco Counseling Counseling given: Not Answered Tobacco comments: Former smoker 12/12/21   Clinical Intake:  Pre-visit preparation completed: Yes  Pain : No/denies pain     BMI - recorded: 27.56 Nutritional Status: BMI 25 -29 Overweight Nutritional Risks: None Diabetes: No  How often do you need to have someone help you when you read  instructions, pamphlets, or other written materials from your doctor or pharmacy?: 1 - Never  Interpreter Needed?: No  Information entered by :: Theresa Mulligan LPN   Activities of Daily  Living    09/23/2023    9:42 AM 09/16/2023    9:15 AM  In your present state of health, do you have any difficulty performing the following activities:  Hearing? 1 1  Comment Wears Hearing Aids   Vision? 0 0  Difficulty concentrating or making decisions? 0 0  Walking or climbing stairs? 0 0  Dressing or bathing? 0 0  Doing errands, shopping? 0 0  Preparing Food and eating ? N N  Using the Toilet? N N  In the past six months, have you accidently leaked urine? N N  Do you have problems with loss of bowel control? N N  Managing your Medications? N N  Managing your Finances? N N  Housekeeping or managing your Housekeeping? N N    Patient Care Team: Bradd Canary, MD as PCP - General Wendall Stade, MD as PCP - Cardiology (Cardiology) Drema Dallas, DO as Consulting Physician (Neurology) Early, Kristen Loader, MD (Inactive) as Consulting Physician (Vascular Surgery) Cherlyn Roberts, MD as Consulting Physician (Dermatology) Pyrtle, Carie Caddy, MD as Consulting Physician (Gastroenterology)  Indicate any recent Medical Services you may have received from other than [facility] providers in the past year (date may be approximate).     Assessment:   This is a routine wellness examination for [place].  Hearing/Vision screen Hearing Screening - Comments:: Wears Hearing Aids Vision Screening - Comments:: Wears rx glasses - up to date with routine eye exams with  Dr Burgess Estelle   Goals Addressed               This Visit's Progress     Increase physical activity (pt-stated)        Lose weight.       Depression Screen    09/23/2023    9:42 AM 03/11/2023    8:17 AM 09/03/2022    3:44 PM 05/25/2022    3:13 PM 11/11/2021    8:58 AM 09/01/2021    1:52 PM 04/17/2021    2:07 PM  PHQ 2/9 Scores  PHQ - 2 Score 0 0 0 0 0 0 0  PHQ- 9 Score       3    Fall Risk    09/23/2023    9:43 AM 09/16/2023    9:15 AM 06/30/2023    9:03 AM 03/11/2023    8:17 AM 09/03/2022    2:58 PM  Fall Risk   Falls in the past  year? 0 0 0 0 0  Number falls in past yr: 0 0 0 0 0  Injury with Fall? 0 0 0 0 0  Risk for fall due to : No Fall Risks    No Fall Risks  Follow up Falls prevention discussed;Falls evaluation completed  Falls evaluation completed Falls evaluation completed Falls evaluation completed    MEDICARE RISK AT HOME: Medicare Risk at Home Any stairs in or around the home?: Yes If so, are there any without handrails?: No Home free of loose throw rugs in walkways, pet beds, electrical cords, etc?: No Adequate lighting in your home to reduce risk of falls?: Yes Life alert?: No Use of a cane, walker or w/c?: No Grab bars in the bathroom?: No Shower chair or bench in shower?: No Elevated toilet seat or a handicapped toilet?: Yes  TIMED UP  AND GO:  Was the test performed?  No    Cognitive Function:        09/23/2023    9:44 AM 09/03/2022    3:55 PM  6CIT Screen  What Year? 0 points 0 points  What month? 0 points 0 points  What time? 0 points 0 points  Count back from 20 0 points 0 points  Months in reverse 0 points 0 points  Repeat phrase 0 points 0 points  Total Score 0 points 0 points    Immunizations Immunization History  Administered Date(s) Administered   Fluad Quad(high Dose 65+) 04/25/2019, 04/11/2020, 04/17/2021, 04/07/2022   Influenza Split 04/05/2012   Influenza Whole 04/19/2009, 05/06/2011   Influenza, High Dose Seasonal PF 05/09/2013, 04/13/2016, 04/16/2017, 04/05/2018, 05/25/2023   Influenza-Unspecified 03/29/2014, 05/07/2015, 04/07/2022   Moderna Covid-19 Vaccine Bivalent Booster 72yrs & up 04/16/2021, 01/14/2022, 04/14/2022   Moderna SARS-COV2 Booster Vaccination 05/13/2020, 11/04/2020   Moderna Sars-Covid-2 Vaccination 03/13/2019, 04/10/2019   PNEUMOCOCCAL CONJUGATE-20 09/20/2023   Pfizer(Comirnaty)Fall Seasonal Vaccine 12 years and older 05/07/2023   Pneumococcal Conjugate-13 05/17/2014   Pneumococcal Polysaccharide-23 07/21/2007, 04/05/2018   Respiratory  Syncytial Virus Vaccine,Recomb Aduvanted(Arexvy) 04/22/2022   Td 07/21/2003   Tdap 06/10/2011, 09/20/2023   Zoster Recombinant(Shingrix) 04/27/2017, 07/04/2017   Zoster, Live 07/21/2007    TDAP status: Up to date  Flu Vaccine status: Up to date  Pneumococcal vaccine status: Up to date  Covid-19 vaccine status: Declined, Education has been provided regarding the importance of this vaccine but patient still declined. Advised may receive this vaccine at local pharmacy or Health Dept.or vaccine clinic. Aware to provide a copy of the vaccination record if obtained from local pharmacy or Health Dept. Verbalized acceptance and understanding.  Qualifies for Shingles Vaccine? Yes   Zostavax completed Yes   Shingrix Completed?: Yes  Screening Tests Health Maintenance  Topic Date Due   FOOT EXAM  Never done   OPHTHALMOLOGY EXAM  05/20/2023   COVID-19 Vaccine (7 - 2024-25 season) 07/19/2024 (Originally 07/02/2023)   HEMOGLOBIN A1C  03/14/2024   Medicare Annual Wellness (AWV)  09/22/2024   DTaP/Tdap/Td (4 - Td or Tdap) 09/19/2033   Pneumonia Vaccine 53+ Years old  Completed   INFLUENZA VACCINE  Completed   Zoster Vaccines- Shingrix  Completed   HPV VACCINES  Aged Out   Colonoscopy  Discontinued    Health Maintenance  Health Maintenance Due  Topic Date Due   FOOT EXAM  Never done   OPHTHALMOLOGY EXAM  05/20/2023       Additional Screening:    Vision Screening: Recommended annual ophthalmology exams for early detection of glaucoma and other disorders of the eye. Is the patient up to date with their annual eye exam?  Yes  Who is the provider or what is the name of the office in which the patient attends annual eye exams? Dr Burgess Estelle  If pt is not established with a provider, would they like to be referred to a provider to establish care? No .   Dental Screening: Recommended annual dental exams for proper oral hygiene    Community Resource Referral / Chronic Care  Management:  CRR required this visit?  No   CCM required this visit?  No     Plan:     I have personally reviewed and noted the following in the patient's chart:   Medical and social history Use of alcohol, tobacco or illicit drugs  Current medications and supplements including opioid prescriptions. Patient is not currently taking opioid  prescriptions. Functional ability and status Nutritional status Physical activity Advanced directives List of other physicians Hospitalizations, surgeries, and ER visits in previous 12 months Vitals Screenings to include cognitive, depression, and falls Referrals and appointments  In addition, I have reviewed and discussed with patient certain preventive protocols, quality metrics, and best practice recommendations. A written personalized care plan for preventive services as well as general preventive health recommendations were provided to patient.     Tillie Rung, LPN   07/25/1094   After Visit Summary: (MyChart) Due to this being a telephonic visit, the after visit summary with patients personalized plan was offered to patient via MyChart   Nurse Notes: None

## 2023-09-23 NOTE — Patient Instructions (Addendum)
 Tanner [name] , Thank you for taking time to come for your Medicare Wellness Visit. I appreciate your ongoing commitment to your health goals. Please review the following plan we discussed and let me know if I can assist you in the future.   Referrals/Orders/Follow-Ups/Clinician Recommendations: Follow up with scheduled appt with Dr Burgess Estelle for yearly eye exam.  This is a list of the screening recommended for you and due dates:  Health Maintenance  Topic Date Due   Complete foot exam   Never done   Eye exam for diabetics  05/20/2023   COVID-19 Vaccine (7 - 2024-25 season) 07/19/2024*   Hemoglobin A1C  03/14/2024   Medicare Annual Wellness Visit  09/22/2024   DTaP/Tdap/Td vaccine (4 - Td or Tdap) 09/19/2033   Pneumonia Vaccine  Completed   Flu Shot  Completed   Zoster (Shingles) Vaccine  Completed   HPV Vaccine  Aged Out   Colon Cancer Screening  Discontinued  *Topic was postponed. The date shown is not the original due date.    Advanced directives: (In Chart) A copy of your advanced directives are scanned into your chart should your provider ever need it.  Next Medicare Annual Wellness Visit scheduled for next year: Yes

## 2023-10-25 ENCOUNTER — Other Ambulatory Visit: Payer: Self-pay | Admitting: Internal Medicine

## 2023-11-17 ENCOUNTER — Other Ambulatory Visit: Payer: Self-pay | Admitting: Internal Medicine

## 2023-11-17 ENCOUNTER — Other Ambulatory Visit: Payer: Self-pay | Admitting: Cardiovascular Disease

## 2023-11-17 DIAGNOSIS — I4819 Other persistent atrial fibrillation: Secondary | ICD-10-CM

## 2023-11-18 NOTE — Telephone Encounter (Addendum)
 Prescription refill request for Xarelto  received.  Indication: afib  Last office visit: Tanner [name] 09/07/2023 Weight: 98.4 kg  Age: 89 yo  Scr: 1.35,  09/15/2023 CrCl: 55 ml/min   Refill sent.

## 2023-11-19 ENCOUNTER — Other Ambulatory Visit (INDEPENDENT_AMBULATORY_CARE_PROVIDER_SITE_OTHER)

## 2023-11-19 ENCOUNTER — Telehealth: Payer: Self-pay | Admitting: *Deleted

## 2023-11-19 DIAGNOSIS — R739 Hyperglycemia, unspecified: Secondary | ICD-10-CM | POA: Diagnosis not present

## 2023-11-19 DIAGNOSIS — R7989 Other specified abnormal findings of blood chemistry: Secondary | ICD-10-CM | POA: Diagnosis not present

## 2023-11-19 DIAGNOSIS — E782 Mixed hyperlipidemia: Secondary | ICD-10-CM

## 2023-11-19 DIAGNOSIS — E039 Hypothyroidism, unspecified: Secondary | ICD-10-CM

## 2023-11-19 DIAGNOSIS — I10 Essential (primary) hypertension: Secondary | ICD-10-CM

## 2023-11-19 LAB — LIPID PANEL
Cholesterol: 155 mg/dL (ref 0–200)
HDL: 46.7 mg/dL (ref >39.00)
LDL Cholesterol: 87 mg/dL (ref 0–99)
NonHDL: 108.57
Total CHOL/HDL Ratio: 3
Triglycerides: 109 mg/dL (ref 0.0–149.0)
VLDL: 21.8 mg/dL (ref 0.0–40.0)

## 2023-11-19 LAB — COMPREHENSIVE METABOLIC PANEL WITH GFR
ALT: 11 U/L (ref 0–53)
AST: 13 U/L (ref 0–37)
Albumin: 4.4 g/dL (ref 3.5–5.2)
Alkaline Phosphatase: 65 U/L (ref 39–117)
BUN: 25 mg/dL — ABNORMAL HIGH (ref 6–23)
CO2: 31 meq/L (ref 19–32)
Calcium: 9.6 mg/dL (ref 8.4–10.5)
Chloride: 101 meq/L (ref 96–112)
Creatinine, Ser: 1.23 mg/dL (ref 0.40–1.50)
GFR: 53.04 mL/min — ABNORMAL LOW (ref >60.00)
Glucose, Bld: 46 mg/dL — CL (ref 70–99)
Potassium: 4.6 meq/L (ref 3.5–5.1)
Sodium: 138 meq/L (ref 135–145)
Total Bilirubin: 0.8 mg/dL (ref 0.2–1.2)
Total Protein: 6.9 g/dL (ref 6.0–8.3)

## 2023-11-19 LAB — VITAMIN D 25 HYDROXY (VIT D DEFICIENCY, FRACTURES): VITD: 39.87 ng/mL (ref 30.00–100.00)

## 2023-11-19 LAB — HEMOGLOBIN A1C: Hgb A1c MFr Bld: 5.7 % (ref 4.6–6.5)

## 2023-11-19 LAB — TSH: TSH: 2.64 u[IU]/mL (ref 0.35–5.50)

## 2023-11-19 NOTE — Telephone Encounter (Signed)
 Patient stated that he was not feeling quite well when he was here but he went home and had something to eat and he feels better now.  He did have a pancake that morning.

## 2023-11-19 NOTE — Telephone Encounter (Signed)
 Please call patient, let him know his blood sugar was low (I do not see that he takes any diabetes medicine). If he is feeling unwell let me know otherwise recommend to avoid prolonged fasting

## 2023-11-19 NOTE — Telephone Encounter (Signed)
 CRITICAL VALUE STICKER  CRITICAL VALUE:  glucose 46  MESSENGER (representative from lab): Sai

## 2023-11-21 ENCOUNTER — Encounter: Payer: Self-pay | Admitting: Family Medicine

## 2023-11-22 LAB — CBC WITH DIFFERENTIAL/PLATELET
Basophils Absolute: 0.1 10*3/uL (ref 0.0–0.1)
Basophils Relative: 1 % (ref 0.0–3.0)
Eosinophils Absolute: 0.3 10*3/uL (ref 0.0–0.7)
Eosinophils Relative: 5.4 % — ABNORMAL HIGH (ref 0.0–5.0)
HCT: 41.7 % (ref 39.0–52.0)
Hemoglobin: 14 g/dL (ref 13.0–17.0)
Lymphocytes Relative: 29.8 % (ref 12.0–46.0)
Lymphs Abs: 1.8 10*3/uL (ref 0.7–4.0)
MCHC: 33.6 g/dL (ref 30.0–36.0)
MCV: 96.6 fl (ref 78.0–100.0)
Monocytes Absolute: 0.5 10*3/uL (ref 0.1–1.0)
Monocytes Relative: 8.6 % (ref 3.0–12.0)
Neutro Abs: 3.3 10*3/uL (ref 1.4–7.7)
Neutrophils Relative %: 55.2 % (ref 43.0–77.0)
Platelets: 177 10*3/uL (ref 150.0–400.0)
RBC: 4.32 Mil/uL (ref 4.22–5.81)
RDW: 13.9 % (ref 11.5–15.5)
WBC: 6.1 10*3/uL (ref 4.0–10.5)

## 2023-12-15 ENCOUNTER — Other Ambulatory Visit (HOSPITAL_BASED_OUTPATIENT_CLINIC_OR_DEPARTMENT_OTHER): Payer: Self-pay

## 2023-12-21 ENCOUNTER — Ambulatory Visit: Admitting: Internal Medicine

## 2023-12-21 ENCOUNTER — Encounter: Payer: Self-pay | Admitting: Internal Medicine

## 2023-12-21 VITALS — BP 140/70 | HR 64 | Ht 72.25 in | Wt 216.6 lb

## 2023-12-21 DIAGNOSIS — R140 Abdominal distension (gaseous): Secondary | ICD-10-CM

## 2023-12-21 DIAGNOSIS — K581 Irritable bowel syndrome with constipation: Secondary | ICD-10-CM

## 2023-12-21 DIAGNOSIS — Z860101 Personal history of adenomatous and serrated colon polyps: Secondary | ICD-10-CM | POA: Diagnosis not present

## 2023-12-21 DIAGNOSIS — K219 Gastro-esophageal reflux disease without esophagitis: Secondary | ICD-10-CM

## 2023-12-21 MED ORDER — IBSRELA 50 MG PO TABS: 1.0000 | ORAL_TABLET | Freq: Two times a day (BID) | ORAL | Status: DC

## 2023-12-21 NOTE — Patient Instructions (Signed)
 Please start the samples of Ibsrella taking one tablet by mouth twice daily. Let our office know if this medication helps and we can send in a prescription to your pharmacy.   _______________________________________________________  If your blood pressure at your visit was 140/90 or greater, please contact your primary care physician to follow up on this.  _______________________________________________________  If you are age 87 or older, your body mass index should be between 23-30. Your Body mass index is 29.17 kg/m. If this is out of the aforementioned range listed, please consider follow up with your Primary Care Provider.  If you are age 87 or younger, your body mass index should be between 19-25. Your Body mass index is 29.17 kg/m. If this is out of the aformentioned range listed, please consider follow up with your Primary Care Provider.   ________________________________________________________  The [facility] providers would like to encourage you to use MYCHART to communicate with providers for non-urgent requests or questions.  Due to long hold times on the telephone, sending your provider a message by [facility] may be a faster and more efficient way to get a response.  Please allow 48 business hours for a response.  Please remember that this is for non-urgent requests.  _______________________________________________________

## 2023-12-22 ENCOUNTER — Encounter: Payer: Self-pay | Admitting: Internal Medicine

## 2023-12-22 NOTE — Progress Notes (Signed)
   Subjective:    Patient ID: Tanner [name], male    DOB: [birth year], 89 y.o.   MRN: [id number]  HPI Tanner [name] is an 89 yo male with hx of adenomatous polyps, left-sided colonic diverticulosis, IBS related constipation, hypertension, hyperlipidemia and hypothyroidism, atrial fibrillation on Xarelto  who is here for follow-up.  He was last seen in the office in April 2023 and for colonoscopy thereafter.  He has been experiencing ongoing constipation and bloating since April 2023. His bowel habits fluctuate between loose stools and constipation, with episodes of liquid bowel movements. These symptoms improve over a few days before constipation recurs. He is currently taking Amitiza  24 mg twice daily, which provides some relief, although bloating and irregular bowel movements persist. He previously tried Linzess , which was ineffective.  He experiences episodes of regurgitation, particularly after late-night meals, and has woken up with regurgitated food in his mouth on two occasions. He denies nausea but notes a slight belch in the mornings without a burning sensation. He continues to take pantoprazole  for reflux management.  He uses a CPAP machine for sleep apnea but has not used it for the past week due to a sore on his nose from a dermatological treatment. The CPAP tends to dry out his mouth, so he keeps water by his bed.   Review of Systems As per HPI, otherwise negative  Current Medications, Allergies, Past Medical History, Past Surgical History, Family History and Social History were reviewed in [employer] record.     Objective:   Physical Exam BP (!) 140/70 (BP Location: Left Arm, Patient Position: Sitting, Cuff Size: Normal)   Pulse 64 Comment: irregular  Ht 6' 0.25" (1.835 m) Comment: height measured without shoes  Wt 216 lb 9 oz (98.2 kg)   BMI 29.17 kg/m  Gen: awake, alert, NAD HEENT: anicteric  Abd: soft, NT/ND, +BS throughout Ext: no c/c/e Neuro:  nonfocal  DIAGNOSTIC Colonoscopy: Five polyps removed, all less than five millimeters. Diverticulosis in the sigmoid and descending colon. Internal hemorrhoids. (01/12/2022)  PATHOLOGY Two tubular adenomas, remaining benign lymphoid aggregates. (01/12/2022)      Assessment & Plan:  89 yo male with hx of adenomatous polyps, left-sided colonic diverticulosis, IBS related constipation, hypertension, hyperlipidemia and hypothyroidism, atrial fibrillation on Xarelto  who is here for follow-up.   Constipation related to IBS with alternating overflow type diarrhea Chronic constipation with alternating diarrhea. Amitiza  partially effective. Discussed Ibsrela for consistent bowel movements and bloating reduction. Informed consent obtained. - Provide Ibsrela samples, take 50 mg twice daily. - Authorize Amitiza  refill, hold off pickup until Ibsrela effectiveness determined. - Message via MyChart in two weeks to report Ibsrela effectiveness.  Gastroesophageal reflux disease (GERD) GERD with occasional regurgitation. Pantoprazole  reduces acid but not reflux. Discussed meal timing and CPAP use to minimize symptoms. - Continue pantoprazole  40 mg once daily. - Avoid eating/drinking large amounts 2-3 hours before lying down. - Consider resuming CPAP once nasal irritation resolves.  Hx of colonic polyps 2 adenomas in June 2023; surveillance colonoscopy likely deferred based on age but not due for a number of years  30 minutes total spent today including patient facing time, coordination of care, reviewing medical history/procedures/pertinent radiology studies, and documentation of the encounter.

## 2023-12-27 ENCOUNTER — Ambulatory Visit: Admitting: Internal Medicine

## 2024-01-02 ENCOUNTER — Encounter: Payer: Self-pay | Admitting: Internal Medicine

## 2024-01-03 ENCOUNTER — Telehealth: Payer: Self-pay

## 2024-01-03 ENCOUNTER — Other Ambulatory Visit: Payer: Self-pay

## 2024-01-03 ENCOUNTER — Other Ambulatory Visit (HOSPITAL_COMMUNITY): Payer: Self-pay

## 2024-01-03 MED ORDER — IBSRELA 50 MG PO TABS: 50.0000 mg | ORAL_TABLET | Freq: Two times a day (BID) | ORAL | 3 refills | Status: AC

## 2024-01-03 MED ORDER — TRULANCE 3 MG PO TABS: 1.0000 | ORAL_TABLET | Freq: Every day | ORAL | 0 refills | Status: DC

## 2024-01-03 NOTE — Telephone Encounter (Signed)
 Dr Bridgett Camps,  Please see note from pharmacy team below and advise...  I can only see that patient has previously tried Linzess , Amitiza , Miralax/fiber.

## 2024-01-03 NOTE — Telephone Encounter (Signed)
 Pharmacy Patient Advocate Encounter   Received notification from Patient Advice Request messages that prior authorization for Ibsrela  50MG  tablets is required/requested.   Insurance verification completed.   The patient is insured through [facility] Medicare Part D.   Per test claim: Prior Authorization form/request asks a question that requires your assistance. Please see the question below and advise accordingly. The PA will not be submitted until the necessary information is received.

## 2024-01-03 NOTE — Telephone Encounter (Signed)
 Patient is advised that insurance requires that he first try and fail Trulance as their preferred medication for IBS-C prior to consideration of IBSrela  approval. Patient verbalizes understanding. Trulance Rx sent to [facility] and patient says he will let us  know in 1-2 weeks if this has been helpful for him.

## 2024-01-03 NOTE — Telephone Encounter (Signed)
 Please make patient aware that he must try Trulance before they will cover Ibsrela  He was doing an Ibsrela  trial by samples I am not certain if it has been helpful However let him know that insurance requires Trulance first he can try this for 3 to 7 days to let us  know if it is effective for his constipation 3 mg daily

## 2024-01-03 NOTE — Progress Notes (Signed)
 Tanner [name]

## 2024-01-03 NOTE — Telephone Encounter (Signed)
 We have sent in the prescription but it will require a prior auth with your insurance. Pt needs PA.

## 2024-01-03 NOTE — Telephone Encounter (Signed)
 Left message for patient to call back

## 2024-01-04 ENCOUNTER — Other Ambulatory Visit (HOSPITAL_COMMUNITY): Payer: Self-pay

## 2024-01-23 ENCOUNTER — Other Ambulatory Visit: Payer: Self-pay | Admitting: Family Medicine

## 2024-01-27 ENCOUNTER — Ambulatory Visit: Admitting: Pulmonary Disease

## 2024-01-27 VITALS — BP 152/78 | HR 65 | Ht 73.0 in | Wt 210.8 lb

## 2024-01-27 DIAGNOSIS — G4733 Obstructive sleep apnea (adult) (pediatric): Secondary | ICD-10-CM

## 2024-01-27 DIAGNOSIS — Z87891 Personal history of nicotine dependence: Secondary | ICD-10-CM | POA: Diagnosis not present

## 2024-01-27 DIAGNOSIS — Z8673 Personal history of transient ischemic attack (TIA), and cerebral infarction without residual deficits: Secondary | ICD-10-CM

## 2024-01-27 DIAGNOSIS — Z8679 Personal history of other diseases of the circulatory system: Secondary | ICD-10-CM | POA: Diagnosis not present

## 2024-01-27 NOTE — Patient Instructions (Signed)
 I will see you a year from now  Continue CPAP  No changes being made to your CPAP at present  Call us  with significant concerns  Continue to stay active

## 2024-01-27 NOTE — Progress Notes (Signed)
 Tanner [name]    [id number]    28-May-1935  Primary Care Physician:Blyth, Harlene LABOR, MD  Referring Physician: [name] LABOR, MD [address] [place],  [place] [address]  Chief complaint:   Patient with a history of obstructive sleep apnea diagnosed with mild obstructive sleep apnea in 2014 Follow-up for obstructive sleep apnea  HPI: Continues to use CPAP on a nightly basis Wakes up feeling rested  Did have a period of about 2 weeks when he was not able to use CPAP when he had a procedure on his nasal bridge  Continues to tolerate CPAP generally well, does have some mouth dryness He does keep water by his bedside  He feels his mask fits well at present  Historically He has been using an oral device since 2014 Device was upgraded about 4 years after initial start date He has had multiple evaluations recently with concerns that the oral device was not working to treat his sleep disordered breathing He stated he has had at least 4 studies with the oral device in place Dr. Micky did get concerned about the device not been very efficient in treating his sleep disordered breathing  He has a history of CVAs, coronary artery disease  Goes to bed between 10 and 1130 A couple of awakenings Wakes up in the morning feeling rested  Continues to tolerate CPAP well   Outpatient Encounter Medications as of 01/27/2024  Medication Sig   acetaminophen  (TYLENOL ) 500 MG tablet Take 1,000 mg by mouth as needed for moderate pain or headache.    aspirin  EC 81 MG tablet Take 81 mg by mouth every evening.    chlorthalidone  (HYGROTON ) 25 MG tablet TAKE 1 TABLET BY MOUTH IN THE  MORNING   Cholecalciferol (VITAMIN D3) 50 MCG (2000 UT) TABS Take 2,000 Units by mouth daily.   levothyroxine  (SYNTHROID ) 88 MCG tablet TAKE 1 TABLET BY MOUTH DAILY  BEFORE BREAKFAST   lubiprostone  (AMITIZA ) 24 MCG capsule TAKE 1 CAPSULE BY MOUTH TWICE  DAILY WITH A MEAL   metoprolol  succinate  (TOPROL -XL) 25 MG 24 hr tablet Take 0.5 tablets (12.5 mg total) by mouth daily. May extra 1/2 tablet daily for breakthrough afib (Patient taking differently: Take 12.5-25 mg by mouth daily. May extra 1/2 tablet daily for breakthrough afib)   Multiple Vitamins-Minerals (MULTIVITAMIN WITH MINERALS) tablet Take 1 tablet by mouth daily. Mega men 50+   mupirocin  ointment (BACTROBAN ) 2 % Place 1 Application into the nose at bedtime as needed (via qtip).   pantoprazole  (PROTONIX ) 40 MG tablet TAKE 1 TABLET BY MOUTH DAILY   Plecanatide  (TRULANCE ) 3 MG TABS Take 1 tablet (3 mg total) by mouth daily.   Polyethyl Glycol-Propyl Glycol (SYSTANE ULTRA OP) Place 1 drop into both eyes in the morning.   ramipril  (ALTACE ) 10 MG capsule TAKE 1 CAPSULE BY MOUTH TWICE  DAILY   rosuvastatin  (CRESTOR ) 40 MG tablet TAKE 1 TABLET BY MOUTH IN THE  EVENING   TART CHERRY PO Take 300 mg by mouth daily.   Tenapanor HCl (IBSRELA ) 50 MG TABS Take 1 tablet by mouth in the morning and at bedtime.   Tenapanor HCl (IBSRELA ) 50 MG TABS Take 50 mg by mouth 2 (two) times daily.   vitamin B-12 (CYANOCOBALAMIN ) 1000 MCG tablet Take 2,000 mcg by mouth every Monday, Wednesday, and Friday.   XARELTO  20 MG TABS tablet TAKE 1 TABLET BY MOUTH DAILY  WITH SUPPER  No facility-administered encounter medications on file as of 01/27/2024.    Allergies as of 01/27/2024   (No Known Allergies)    Past Medical History:  Diagnosis Date   Allergy    seasonal- mostly spring   Anemia 02/03/2011   ASTHMA, CHILDHOOD 03/17/2010   mostly as child   BCC (basal cell carcinoma) 12/08/2011   CAROTID ARTERY OCCLUSION, WITH INFARCTION 03/17/2010   CEREBROVASCULAR ACCIDENT 03/17/2010   partial loss of peripheral vision on left-slight improved   CHICKENPOX, HX OF 03/17/2010   Constipation 12/08/2014   Epistaxis 09/06/2013   FATIGUE 03/17/2010   Hearing loss 11/04/2010   bilateral   HYPERKALEMIA 05/27/2010   Hyperlipidemia    Hypertension    Hypothyroid  12/08/2011   Mixed hyperlipidemia 03/17/2010   Oral lesion 11/20/2014   Overweight(278.02) 07/01/2010   Peripheral neuropathy 08/18/2016   Personal history of other infectious and parasitic disease 03/17/2010   Preventative health care 08/23/2016   Skin lesion of right arm 11/20/2014   Sleep apnea    uses mouthpiece only   Thrombocytopenia (HCC) 04/05/2012   TOBACCO ABUSE, HX OF 03/17/2010   Tubular adenoma of colon 06/10/2011    Past Surgical History:  Procedure Laterality Date   CARDIOVERSION N/A 08/11/2019   Procedure: CARDIOVERSION;  Surgeon: Delford Maude BROCKS, MD;  Location: [facility] ENDOSCOPY;  Service: Cardiovascular;  Laterality: N/A;   CARDIOVERSION N/A 12/05/2021   Procedure: CARDIOVERSION;  Surgeon: Delford Maude BROCKS, MD;  Location: [facility] ENDOSCOPY;  Service: Cardiovascular;  Laterality: N/A;   CAROTID ENDARTERECTOMY Left February 13, 2013   cea   CATARACT EXTRACTION, BILATERAL Right    Cataract, and stigmatism   COLONOSCOPY  Dec. 2014   ENDARTERECTOMY Left 02/13/2013   Procedure: ENDARTERECTOMY CAROTID;  Surgeon: Krystal JULIANNA Doing, MD;  Location: [facility] OR;  Service: Vascular;  Laterality: Left;   EUS N/A 08/17/2013   Procedure: UPPER ENDOSCOPIC ULTRASOUND (EUS) LINEAR;  Surgeon: Toribio SHAUNNA Cedar, MD;  Location: [facility] ENDOSCOPY;  Service: Endoscopy;  Laterality: N/A;   knee cartliage repair  1964, 1990   bilateral   PILONIDAL CYST EXCISION  1968   removed   ROTATOR CUFF REPAIR     right   TONSILLECTOMY     UPPER GI ENDOSCOPY  Jan. 2015    Family History  Problem Relation Age of Onset   Alzheimer's disease Mother    Emphysema Sister        cigarettes   Cancer Sister        lung, tobacco    Heart disease Son    Coronary artery disease Brother    Heart attack Brother    Other Brother        heart problems- from fathers side   Cancer Brother 35       lung? smoker   Heart disease Brother 31       Heart disease before age 69   Cancer Sister        gyn   Heart disease Sister    Bipolar disorder  Daughter    Obesity Daughter    Stroke Father    Heart disease Sister    Esophageal cancer Neg Hx    Colon cancer Neg Hx    Pancreatic cancer Neg Hx    Stomach cancer Neg Hx     Social History   Socioeconomic History   Marital status: Divorced    Spouse name: Not on file   Number of children: 3   Years of education: 36yrs  Highest education level: Bachelor's degree (e.g., BA, AB, BS)  Occupational History   Occupation: retired    Associate Professor: RETIRED  Tobacco Use   Smoking status: Former    Current packs/day: 0.00    Average packs/day: 2.0 packs/day for 20.0 years (40.0 ttl pk-yrs)    Types: Cigarettes    Start date: 07/19/1957    Quit date: 07/19/1977    Years since quitting: 46.5    Passive exposure: Never   Smokeless tobacco: Never   Tobacco comments:    Former smoker 12/12/21  Vaping Use   Vaping status: Never Used  Substance and Sexual Activity   Alcohol use: Not Currently    Alcohol/week: 10.0 standard drinks of alcohol    Types: 7 Glasses of wine, 2 Cans of beer, 1 Shots of liquor per week   Drug use: No   Sexual activity: Not Currently  Other Topics Concern   Not on file  Social History Narrative   Pt lives at home alone.    Caffeine Use: very little   Heart healthy diet, is planning exercise      Retired from Patent examiner   Left handed   Social Drivers of Health   Financial Resource Strain: Low Risk  (09/23/2023)   Overall Financial Resource Strain (CARDIA)    Difficulty of Paying Living Expenses: Not hard at all  Food Insecurity: No Food Insecurity (09/23/2023)   Hunger Vital Sign    Worried About Running Out of Food in the Last Year: Never true    Ran Out of Food in the Last Year: Never true  Transportation Needs: No Transportation Needs (09/23/2023)   PRAPARE - Administrator, Civil Service (Medical): No    Lack of Transportation (Non-Medical): No  Physical Activity: Insufficiently Active (09/23/2023)   Exercise Vital Sign    Days of  Exercise per Week: 4 days    Minutes of Exercise per Session: 30 min  Stress: No Stress Concern Present (09/23/2023)   [employer] of Occupational Health - Occupational Stress Questionnaire    Feeling of Stress : Not at all  Social Connections: Moderately Integrated (09/23/2023)   Social Connection and Isolation Panel    Frequency of Communication with Friends and Family: More than three times a week    Frequency of Social Gatherings with Friends and Family: More than three times a week    Attends Religious Services: More than 4 times per year    Active Member of [employer] or Organizations: Yes    Attends Banker Meetings: More than 4 times per year    Marital Status: Divorced  Intimate Partner Violence: Not At Risk (09/23/2023)   Humiliation, Afraid, Rape, and Kick questionnaire    Fear of Current or Ex-Partner: No    Emotionally Abused: No    Physically Abused: No    Sexually Abused: No    Review of Systems  Constitutional:  Negative for fatigue.  HENT: Negative.    Respiratory:  Positive for apnea.   Cardiovascular: Negative.   Gastrointestinal: Negative.   Psychiatric/Behavioral:  Positive for sleep disturbance.     Vitals:   01/27/24 0905  BP: (!) 152/78  Pulse: 65    Physical Exam Constitutional:      Appearance: Normal appearance. He is well-developed.  HENT:     Head: Normocephalic.     Mouth/Throat:     Mouth: Mucous membranes are moist.  Eyes:     General:  Right eye: No discharge.        Left eye: No discharge.     Pupils: Pupils are equal, round, and reactive to light.  Neck:     Thyroid : No thyromegaly.     Trachea: No tracheal deviation.  Cardiovascular:     Rate and Rhythm: Normal rate and regular rhythm.  Pulmonary:     Effort: Pulmonary effort is normal. No respiratory distress.     Breath sounds: Normal breath sounds. No stridor. No wheezing, rhonchi or rales.  Musculoskeletal:     Cervical back: Normal range of motion and neck  supple.  Neurological:     Mental Status: He is alert.  Psychiatric:        Mood and Affect: Mood normal.    Data Reviewed: Previous sleep study from 2014 reviewed showing mild obstructive sleep apnea Pressure was changed from last visit from 5-15 Currently 10-18  Compliance data reviewed showing 72% compliance Average use of 7 hours 31 minutes AutoSet 10-18 Maximum pressure of 17.7 with 95 percentile pressure of 17.2 Residual AHI of 6.3  Assessment:   .  Mild obstructive sleep apnea - Adequately treated with CPAP therapy - Continues to benefit from CPAP use - Feels he gets better quality sleep with his CPAP on  .  History of coronary artery disease  .  History of CVA  .  Benefits of continuing to use CPAP on a nightly basis was discussed  Plan/Recommendations:  .  Continue nightly CPAP  .  Exercise regularly  .  Follow-up a year from now  Encouraged to call with significant concerns  Jennet Epley MD [facility] 01/27/2024, 9:15 AM  CC: [name] LABOR, MD

## 2024-01-31 NOTE — Telephone Encounter (Signed)
 See if Ibsrela  would be covered given tried and failed linzess , amitiza , trulance , lactulose, miralax, senna. Ibsrela  50 mg BID

## 2024-02-01 ENCOUNTER — Telehealth: Payer: Self-pay

## 2024-02-01 ENCOUNTER — Other Ambulatory Visit (HOSPITAL_COMMUNITY): Payer: Self-pay

## 2024-02-01 NOTE — Telephone Encounter (Signed)
 Pharmacy Patient Advocate Encounter   Received notification from Patient Advice Request messages that prior authorization for Ibsrela  50MG  tablets is required/requested.   Insurance verification completed.   The patient is insured through [facility] Medicare Part D.   Per test claim: PA required; PA submitted to above mentioned insurance via CoverMyMeds Key/confirmation #/EOC [facility] Status is pending

## 2024-02-01 NOTE — Telephone Encounter (Signed)
 Pharmacy Patient Advocate Encounter  Received notification from [employer] Medicare Part D that Prior Authorization for Ibsrela  50MG  tablets has been APPROVED from 02-01-2024 to 05-03-2024   PA #/Case ID/Reference #: [id number]

## 2024-02-08 NOTE — Progress Notes (Unsigned)
 [name] [address] [place] [address] Phone: [phone] Subjective:   LILLETTE Berwyn Posey, am serving as a scribe for Dr. Arthea Claudene.  I'm seeing this patient by the request  of:  [name] LABOR, MD  CC: back pain follow up   YEP:Dlagzrupcz  04/28/2022 Using the carbon fiber again.  No significant discomfort in the hip or the knee.  At this point can follow-up as needed.  States that this is the best he has felt in years when it comes to this leg.     Updated 02/10/2024 DEWARD SEBEK is a 89 y.o. male coming in with complaint of back and gluteal pain for past month. Patient states that he has been picking up a lot of branches in his yard. Developed pain in the R hamstring. Pain is worsening and he has to lead with the L leg when ascending stairs. Most of his pain now is over ischial tuberosity. No pain when he is lying supine.       Past Medical History:  Diagnosis Date   Allergy    seasonal- mostly spring   Anemia 02/03/2011   ASTHMA, CHILDHOOD 03/17/2010   mostly as child   BCC (basal cell carcinoma) 12/08/2011   CAROTID ARTERY OCCLUSION, WITH INFARCTION 03/17/2010   CEREBROVASCULAR ACCIDENT 03/17/2010   partial loss of peripheral vision on left-slight improved   CHICKENPOX, HX OF 03/17/2010   Constipation 12/08/2014   Epistaxis 09/06/2013   FATIGUE 03/17/2010   Hearing loss 11/04/2010   bilateral   HYPERKALEMIA 05/27/2010   Hyperlipidemia    Hypertension    Hypothyroid 12/08/2011   Mixed hyperlipidemia 03/17/2010   Oral lesion 11/20/2014   Overweight(278.02) 07/01/2010   Peripheral neuropathy 08/18/2016   Personal history of other infectious and parasitic disease 03/17/2010   Preventative health care 08/23/2016   Skin lesion of right arm 11/20/2014   Sleep apnea    uses mouthpiece only   Thrombocytopenia (HCC) 04/05/2012   TOBACCO ABUSE, HX OF 03/17/2010   Tubular adenoma of colon 06/10/2011   Past Surgical History:  Procedure  Laterality Date   CARDIOVERSION N/A 08/11/2019   Procedure: CARDIOVERSION;  Surgeon: Delford Maude BROCKS, MD;  Location: [facility] ENDOSCOPY;  Service: Cardiovascular;  Laterality: N/A;   CARDIOVERSION N/A 12/05/2021   Procedure: CARDIOVERSION;  Surgeon: Delford Maude BROCKS, MD;  Location: [facility] ENDOSCOPY;  Service: Cardiovascular;  Laterality: N/A;   CAROTID ENDARTERECTOMY Left February 13, 2013   cea   CATARACT EXTRACTION, BILATERAL Right    Cataract, and stigmatism   COLONOSCOPY  Dec. 2014   ENDARTERECTOMY Left 02/13/2013   Procedure: ENDARTERECTOMY CAROTID;  Surgeon: Krystal JULIANNA Doing, MD;  [facility] OR;  Service: Vascular;  Laterality: Left;   EUS N/A 08/17/2013   Procedure: UPPER ENDOSCOPIC ULTRASOUND (EUS) LINEAR;  Surgeon: Toribio SHAUNNA Cedar, MD;  Location: [facility] ENDOSCOPY;  Service: Endoscopy;  Laterality: N/A;   knee cartliage repair  1964, 1990   bilateral   PILONIDAL CYST EXCISION  1968   removed   ROTATOR CUFF REPAIR     right   TONSILLECTOMY     UPPER GI ENDOSCOPY  Jan. 2015   Social History   Socioeconomic History   Marital status: Divorced    Spouse name: Not on file   Number of children: 3   Years of education: 62yrs   Highest education level: Bachelor's degree (e.g., BA, AB, BS)  Occupational History   Occupation: retired    Associate Professor: RETIRED  Tobacco Use   Smoking status: Former    Current packs/day: 0.00    Average packs/day: 2.0 packs/day for 20.0 years (40.0 ttl pk-yrs)    Types: Cigarettes    Start date: 07/19/1957    Quit date: 07/19/1977    Years since quitting: 46.5    Passive exposure: Never   Smokeless tobacco: Never   Tobacco comments:    Former smoker 12/12/21  Vaping Use   Vaping status: Never Used  Substance and Sexual Activity   Alcohol use: Not Currently    Alcohol/week: 10.0 standard drinks of alcohol    Types: 7 Glasses of wine, 2 Cans of beer, 1 Shots of liquor per week   Drug use: No   Sexual activity: Not Currently  Other Topics Concern   Not on file   Social History Narrative   Pt lives at home alone.    Caffeine Use: very little   Heart healthy diet, is planning exercise      Retired from Patent examiner   Left handed   Social Drivers of Health   Financial Resource Strain: Low Risk  (09/23/2023)   Overall Financial Resource Strain (CARDIA)    Difficulty of Paying Living Expenses: Not hard at all  Food Insecurity: No Food Insecurity (09/23/2023)   Hunger Vital Sign    Worried About Running Out of Food in the Last Year: Never true    Ran Out of Food in the Last Year: Never true  Transportation Needs: No Transportation Needs (09/23/2023)   PRAPARE - Administrator, Civil Service (Medical): No    Lack of Transportation (Non-Medical): No  Physical Activity: Insufficiently Active (09/23/2023)   Exercise Vital Sign    Days of Exercise per Week: 4 days    Minutes of Exercise per Session: 30 min  Stress: No Stress Concern Present (09/23/2023)   [employer] of Occupational Health - Occupational Stress Questionnaire    Feeling of Stress : Not at all  Social Connections: Moderately Integrated (09/23/2023)   Social Connection and Isolation Panel    Frequency of Communication with Friends and Family: More than three times a week    Frequency of Social Gatherings with Friends and Family: More than three times a week    Attends Religious Services: More than 4 times per year    Active Member of [employer] or Organizations: Yes    Attends Engineer, structural: More than 4 times per year    Marital Status: Divorced   No Known Allergies Family History  Problem Relation Age of Onset   Alzheimer's disease Mother    Emphysema Sister        cigarettes   Cancer Sister        lung, tobacco    Heart disease Son    Coronary artery disease Brother    Heart attack Brother    Other Brother        heart problems- from fathers side   Cancer Brother 3       lung? smoker   Heart disease Brother 31       Heart disease before age 19    Cancer Sister        gyn   Heart disease Sister    Bipolar disorder Daughter    Obesity Daughter    Stroke Father    Heart disease Sister    Esophageal cancer Neg Hx    Colon cancer Neg Hx    Pancreatic cancer Neg Hx  Stomach cancer Neg Hx     Current Outpatient Medications (Endocrine & Metabolic):    levothyroxine  (SYNTHROID ) 88 MCG tablet, TAKE 1 TABLET BY MOUTH DAILY  BEFORE BREAKFAST  Current Outpatient Medications (Cardiovascular):    chlorthalidone  (HYGROTON ) 25 MG tablet, TAKE 1 TABLET BY MOUTH IN THE  MORNING   metoprolol  succinate (TOPROL -XL) 25 MG 24 hr tablet, Take 0.5 tablets (12.5 mg total) by mouth daily. May extra 1/2 tablet daily for breakthrough afib (Patient taking differently: Take 12.5-25 mg by mouth daily. May extra 1/2 tablet daily for breakthrough afib)   ramipril  (ALTACE ) 10 MG capsule, TAKE 1 CAPSULE BY MOUTH TWICE  DAILY   rosuvastatin  (CRESTOR ) 40 MG tablet, TAKE 1 TABLET BY MOUTH IN THE  EVENING   Current Outpatient Medications (Analgesics):    acetaminophen  (TYLENOL ) 500 MG tablet, Take 1,000 mg by mouth as needed for moderate pain or headache.    aspirin  EC 81 MG tablet, Take 81 mg by mouth every evening.   Current Outpatient Medications (Hematological):    vitamin B-12 (CYANOCOBALAMIN ) 1000 MCG tablet, Take 2,000 mcg by mouth every Monday, Wednesday, and Friday.   XARELTO  20 MG TABS tablet, TAKE 1 TABLET BY MOUTH DAILY  WITH SUPPER  Current Outpatient Medications (Other):    Cholecalciferol (VITAMIN D3) 50 MCG (2000 UT) TABS, Take 2,000 Units by mouth daily.   Multiple Vitamins-Minerals (MULTIVITAMIN WITH MINERALS) tablet, Take 1 tablet by mouth daily. Mega men 50+   mupirocin  ointment (BACTROBAN ) 2 %, Place 1 Application into the nose at bedtime as needed (via qtip).   pantoprazole  (PROTONIX ) 40 MG tablet, TAKE 1 TABLET BY MOUTH DAILY   Polyethyl Glycol-Propyl Glycol (SYSTANE ULTRA OP), Place 1 drop into both eyes in the morning.   TART CHERRY  PO, Take 300 mg by mouth daily.   Tenapanor HCl (IBSRELA ) 50 MG TABS, Take 50 mg by mouth 2 (two) times daily.   lubiprostone  (AMITIZA ) 24 MCG capsule, TAKE 1 CAPSULE BY MOUTH TWICE  DAILY WITH A MEAL   Plecanatide  (TRULANCE ) 3 MG TABS, Take 1 tablet (3 mg total) by mouth daily.   Tenapanor HCl (IBSRELA ) 50 MG TABS, Take 1 tablet by mouth in the morning and at bedtime.   Reviewed prior external information including notes and imaging from  primary care provider As well as notes that were available from care everywhere and other healthcare systems.  Past medical history, social, surgical and family history all reviewed in electronic medical record.  No pertanent information unless stated regarding to the chief complaint.   Review of Systems:  No headache, visual changes, nausea, vomiting, diarrhea, constipation, dizziness, abdominal pain, skin rash, fevers, chills, night sweats, weight loss, swollen lymph nodes, body aches, joint swelling, chest pain, shortness of breath, mood changes. POSITIVE muscle aches  Objective  Blood pressure 116/68, pulse 66, height 6' 1 (1.854 m), weight 206 lb (93.4 kg), SpO2 99%.   General: No apparent distress alert and oriented x3 mood and affect normal, dressed appropriately.  HEENT: Pupils equal, extraocular movements intact  Respiratory: Patient's speak in full sentences and does not appear short of breath  Cardiovascular: No lower extremity edema, non tender, no erythema  Hip exam shows good range of motion noted.  Pain with resisted flexion.  Seems to be more over the ischium than anywhere else.  Mild discoloration in this area as well.  No defect noted of the hamstring.  Patient does have cramping when he moves going past 95 degrees of flexion.  Back  exam shows mild tenderness noted.  More pain over the greater trochanteric area than anywhere else.    Impression and Recommendations:    The above documentation has been reviewed and is accurate and  complete Jarius Dieudonne M Jalayah Gutridge, DO

## 2024-02-10 ENCOUNTER — Ambulatory Visit: Admitting: Family Medicine

## 2024-02-10 VITALS — BP 116/68 | HR 66 | Ht 73.0 in | Wt 206.0 lb

## 2024-02-10 DIAGNOSIS — S76301A Unspecified injury of muscle, fascia and tendon of the posterior muscle group at thigh level, right thigh, initial encounter: Secondary | ICD-10-CM | POA: Diagnosis not present

## 2024-02-10 DIAGNOSIS — R2689 Other abnormalities of gait and mobility: Secondary | ICD-10-CM | POA: Diagnosis not present

## 2024-02-10 DIAGNOSIS — E538 Deficiency of other specified B group vitamins: Secondary | ICD-10-CM | POA: Diagnosis not present

## 2024-02-10 NOTE — Patient Instructions (Signed)
 Hamstring exercises Heel lifts in shoes PT [facility] Thigh compression sleeve daily  Short stride length See us  in 2 months

## 2024-02-10 NOTE — Assessment & Plan Note (Signed)
-   Referred to physical therapy

## 2024-02-10 NOTE — Assessment & Plan Note (Signed)
 Patient does have a hamstring injury noted.  Discussed icing regimen of home exercises, discussed which activities to do and which ones to avoid.  I do feel though that thigh compression could be helpful.  Differential includes more of a lumbar radiculopathy.  Will monitor.  Does also have some greater trochanteric tenderness.  Has some difficulty with balance so I do feel formal physical therapy would be beneficial.  Discussed icing regimen of home exercises otherwise.  Follow-up again in 6 to 8 weeks.

## 2024-02-10 NOTE — Assessment & Plan Note (Signed)
 Encourage supplementation and may need to recheck.

## 2024-02-16 ENCOUNTER — Encounter: Payer: Self-pay | Admitting: Family Medicine

## 2024-02-17 ENCOUNTER — Encounter (HOSPITAL_COMMUNITY): Payer: Self-pay | Admitting: Physician Assistant

## 2024-02-17 ENCOUNTER — Ambulatory Visit (HOSPITAL_COMMUNITY)
Admission: RE | Admit: 2024-02-17 | Discharge: 2024-02-17 | Disposition: A | Discharge status: Home / Self Care | Source: Ambulatory Visit | Attending: Physician Assistant | Admitting: Physician Assistant

## 2024-02-17 VITALS — BP 150/78 | HR 81 | Ht 73.0 in | Wt 205.8 lb

## 2024-02-17 DIAGNOSIS — D6869 Other thrombophilia: Secondary | ICD-10-CM | POA: Diagnosis not present

## 2024-02-17 DIAGNOSIS — I4819 Other persistent atrial fibrillation: Secondary | ICD-10-CM | POA: Diagnosis not present

## 2024-02-17 NOTE — Progress Notes (Signed)
 Primary Care Physician: [name] LABOR, MD Primary Cardiologist: Dr Delford Primary Electrophysiologist: none Referring Physician: Dr Delford Tanner [name] is a 89 y.o. male with a history of HTN, tobacco abuse quit 2011, CVA 2011, HLD, hypothyroidism, OSA, and persistent atrial fibrillation who presents for follow up in the [facility]. The patient was initially diagnosed with atrial fibrillation 05/2019 at his PCP office after noting irregular readings on his FitBit. He is otherwise asymptomatic and rate controlled. Patient is on Xarelto  for stroke prevention. Patient is s/p DCCV on 08/11/19. Patient presented to the ED  10/26/21 feeling lightheaded and weak. His smart watch showed afib. ECG at the ED showed reasonably rate controlled afib and DCCV was not pursued at that time. S/p DCCV on 12/05/21.   Patient returns for follow up for atrial fibrillation. He reports that he has done well since his last visit. He has not had any interim symptoms of afib. He does note some chronic fatigue but admits he has not been very active.   Today, he  denies symptoms of palpitations, chest pain, shortness of breath, orthopnea, PND, lower extremity edema, dizziness, presyncope, syncope, snoring, daytime somnolence, bleeding, or neurologic sequela. The patient is tolerating medications without difficulties and is otherwise without complaint today.    Atrial Fibrillation Risk Factors:  he does have symptoms or diagnosis of sleep apnea. he does not have a history of rheumatic fever. he does have a history of alcohol use. The patient does not have a history of early familial atrial fibrillation or other arrhythmias.   Atrial Fibrillation Management history:  Previous antiarrhythmic drugs: none Previous cardioversions: 08/11/19, 12/05/21 Previous ablations: none Anticoagulation history: Xarelto    Past Medical History:  Diagnosis Date   Allergy    seasonal- mostly spring    Anemia 02/03/2011   ASTHMA, CHILDHOOD 03/17/2010   mostly as child   BCC (basal cell carcinoma) 12/08/2011   CAROTID ARTERY OCCLUSION, WITH INFARCTION 03/17/2010   CEREBROVASCULAR ACCIDENT 03/17/2010   partial loss of peripheral vision on left-slight improved   CHICKENPOX, HX OF 03/17/2010   Constipation 12/08/2014   Epistaxis 09/06/2013   FATIGUE 03/17/2010   Hearing loss 11/04/2010   bilateral   HYPERKALEMIA 05/27/2010   Hyperlipidemia    Hypertension    Hypothyroid 12/08/2011   Mixed hyperlipidemia 03/17/2010   Oral lesion 11/20/2014   Overweight(278.02) 07/01/2010   Peripheral neuropathy 08/18/2016   Personal history of other infectious and parasitic disease 03/17/2010   Preventative health care 08/23/2016   Skin lesion of right arm 11/20/2014   Sleep apnea    uses mouthpiece only   Thrombocytopenia (HCC) 04/05/2012   TOBACCO ABUSE, HX OF 03/17/2010   Tubular adenoma of colon 06/10/2011    ROS- All systems are reviewed and negative except as per the HPI above.  Physical Exam: Vitals:   02/17/24 0949  BP: (!) 150/78  Pulse: 81  Weight: 93.4 kg  Height: 6' 1 (1.854 m)     GEN: Well nourished, well developed in no acute distress CARDIAC: Regular rate and rhythm, no murmurs, rubs, gallops RESPIRATORY:  Clear to auscultation without rales, wheezing or rhonchi  ABDOMEN: Soft, non-tender, non-distended EXTREMITIES:  No edema; No deformity    Wt Readings from Last 3 Encounters:  02/17/24 93.4 kg  02/10/24 93.4 kg  01/27/24 95.6 kg    EKG today demonstrates  SR, PAC Vent. rate 81 BPM PR interval 188 ms QRS duration 90 ms QT/QTcB 354/411 ms  Echo 09/22/22 demonstrated   1. Left ventricular ejection fraction, by estimation, is 60 to 65%. The  left ventricle has normal function. The left ventricle has no regional  wall motion abnormalities. Left ventricular diastolic parameters were  normal. The average left ventricular global longitudinal strain is -20.6 %. The global  longitudinal strain is normal.   2. Right ventricular systolic function is normal. The right ventricular  size is normal. There is normal pulmonary artery systolic pressure.   3. Left atrial size was moderately dilated.   4. The mitral valve is degenerative. Mild to moderate mitral valve  regurgitation.   5. The aortic valve is tricuspid. There is moderate calcification of the  aortic valve. There is mild thickening of the aortic valve. Aortic valve  regurgitation is trivial. Mild aortic valve stenosis.   Comparison(s): No significant change from prior study. Prior images  reviewed side by side.   Epic records are reviewed at length today  CHA2DS2-VASc Score = 6  The patient's score is based upon: CHF History: 0 HTN History: 1 Diabetes History: 0 Stroke History: 2 Vascular Disease History: 1 Age Score: 2 Gender Score: 0       ASSESSMENT AND PLAN: Persistent Atrial Fibrillation (ICD10:  I48.19) The patient's CHA2DS2-VASc score is 6, indicating a 9.7% annual risk of stroke.   Patient appears to be maintaining SR Continue Toprol  12.5 mg daily Continue Xarelto  20 mg daily Apple Watch for home monitoring.   Secondary Hypercoagulable State (ICD10:  D68.69) The patient is at significant risk for stroke/thromboembolism based upon his CHA2DS2-VASc Score of 6.  Continue Rivaroxaban  (Xarelto ). No bleeding issues.   OSA  Encouraged nightly CPAP  HTN Stable on current regimen   Follow up in the AF clinic in one year.    Daril Kicks PA-C Af[facility] [address] [place], [place] [address] [phone] 02/17/2024 1:19 PM

## 2024-03-01 ENCOUNTER — Ambulatory Visit: Attending: Family Medicine

## 2024-03-01 DIAGNOSIS — S76301A Unspecified injury of muscle, fascia and tendon of the posterior muscle group at thigh level, right thigh, initial encounter: Secondary | ICD-10-CM | POA: Insufficient documentation

## 2024-03-01 DIAGNOSIS — X58XXXA Exposure to other specified factors, initial encounter: Secondary | ICD-10-CM | POA: Insufficient documentation

## 2024-03-01 DIAGNOSIS — R262 Difficulty in walking, not elsewhere classified: Secondary | ICD-10-CM | POA: Insufficient documentation

## 2024-03-01 DIAGNOSIS — M6281 Muscle weakness (generalized): Secondary | ICD-10-CM | POA: Diagnosis not present

## 2024-03-01 DIAGNOSIS — M79604 Pain in right leg: Secondary | ICD-10-CM

## 2024-03-01 DIAGNOSIS — R2689 Other abnormalities of gait and mobility: Secondary | ICD-10-CM | POA: Insufficient documentation

## 2024-03-01 DIAGNOSIS — R252 Cramp and spasm: Secondary | ICD-10-CM

## 2024-03-01 NOTE — Therapy (Signed)
 OUTPATIENT PHYSICAL THERAPY LOWER EXTREMITY EVALUATION   Patient Name: Tanner [name] MRN: [id number] DOB:[birth year], 89 y.o., male Today's Date: 03/01/2024  END OF SESSION:  PT End of Session - 03/01/24 0856     Visit Number 1    Date for PT Re-Evaluation 04/26/24    Authorization Type [employer] [facility]    PT Start Time 0802    PT Stop Time 0856    PT Time Calculation (min) 54 min          Past Medical History:  Diagnosis Date   Allergy    seasonal- mostly spring   Anemia 02/03/2011   ASTHMA, CHILDHOOD 03/17/2010   mostly as child   BCC (basal cell carcinoma) 12/08/2011   CAROTID ARTERY OCCLUSION, WITH INFARCTION 03/17/2010   CEREBROVASCULAR ACCIDENT 03/17/2010   partial loss of peripheral vision on left-slight improved   CHICKENPOX, HX OF 03/17/2010   Constipation 12/08/2014   Epistaxis 09/06/2013   FATIGUE 03/17/2010   Hearing loss 11/04/2010   bilateral   HYPERKALEMIA 05/27/2010   Hyperlipidemia    Hypertension    Hypothyroid 12/08/2011   Mixed hyperlipidemia 03/17/2010   Oral lesion 11/20/2014   Overweight(278.02) 07/01/2010   Peripheral neuropathy 08/18/2016   Personal history of other infectious and parasitic disease 03/17/2010   Preventative health care 08/23/2016   Skin lesion of right arm 11/20/2014   Sleep apnea    uses mouthpiece only   Thrombocytopenia (HCC) 04/05/2012   TOBACCO ABUSE, HX OF 03/17/2010   Tubular adenoma of colon 06/10/2011   Past Surgical History:  Procedure Laterality Date   CARDIOVERSION N/A 08/11/2019   Procedure: CARDIOVERSION;  Surgeon: Delford Maude BROCKS, MD;  Location: [facility] ENDOSCOPY;  Service: Cardiovascular;  Laterality: N/A;   CARDIOVERSION N/A 12/05/2021   Procedure: CARDIOVERSION;  Surgeon: Delford Maude BROCKS, MD;  Location: [facility] ENDOSCOPY;  Service: Cardiovascular;  Laterality: N/A;   CAROTID ENDARTERECTOMY Left February 13, 2013   cea   CATARACT EXTRACTION, BILATERAL Right    Cataract, and stigmatism   COLONOSCOPY  Dec. 2014   ENDARTERECTOMY Left  02/13/2013   Procedure: ENDARTERECTOMY CAROTID;  Surgeon: Krystal JULIANNA Doing, MD;  Location: [facility] OR;  Service: Vascular;  Laterality: Left;   EUS N/A 08/17/2013   Procedure: UPPER ENDOSCOPIC ULTRASOUND (EUS) LINEAR;  Surgeon: Toribio SHAUNNA Cedar, MD;  Location: [facility] ENDOSCOPY;  Service: Endoscopy;  Laterality: N/A;   knee cartliage repair  1964, 1990   bilateral   PILONIDAL CYST EXCISION  1968   removed   ROTATOR CUFF REPAIR     right   TONSILLECTOMY     UPPER GI ENDOSCOPY  Jan. 2015   Patient Active Problem List   Diagnosis Date Noted   Hamstring injury, right, initial encounter 02/10/2024   Balance disorder 02/10/2024   Disorder of vitamin B12 05/27/2022   Urinary frequency 05/27/2022   Greater trochanteric bursitis, left 02/02/2022   Insomnia 11/11/2021   Loss of transverse plantar arch 12/31/2020   Low vitamin D  level 11/08/2020   Degenerative arthritis of right knee 11/08/2020   Feeling grief 08/14/2020   Hypercoagulable state due to persistent atrial fibrillation (HCC) 08/02/2019   Persistent atrial fibrillation (HCC) 06/04/2019   Educated about COVID-19 virus infection 04/02/2019   Hyperglycemia 04/05/2018   Cough 04/05/2018   Preventative health care 08/23/2016   Peripheral neuropathy 08/18/2016   Constipation 12/08/2014   Skin lesion of right arm 11/20/2014   Oral lesion 11/20/2014   Medicare annual wellness visit, subsequent 05/27/2014   Occlusion and stenosis  of carotid artery without mention of cerebral infarction 09/19/2013   Plantar fasciitis of left foot 05/22/2013   Varicose veins of leg with pain 05/22/2013   OSA (obstructive sleep apnea) 12/03/2012   Thrombocytopenia (HCC) 04/05/2012   Hypothyroid 12/08/2011   BCC (basal cell carcinoma) 12/08/2011   Tubular adenoma of colon 06/10/2011   Anemia 02/03/2011   Hearing loss 11/04/2010   Overweight 07/01/2010   HYPERKALEMIA 05/27/2010   Mixed hyperlipidemia 03/17/2010   Essential hypertension, benign 03/17/2010   Mild  atherosclerosis of carotid artery 03/17/2010   H/O: CVA (cerebrovascular accident) 03/17/2010   ALLERGIC RHINITIS CAUSE UNSPECIFIED 03/17/2010   ASTHMA, CHILDHOOD 03/17/2010   TOBACCO ABUSE, HX OF 03/17/2010   CHICKENPOX, HX OF 03/17/2010    PCP:    [name] LABOR, MD    REFERRING PROVIDER: Claudene Arthea HERO, DO  REFERRING DIAG: R26.89 (ICD-10-CM) - Balance disorder S76.301A (ICD-10-CM) - Hamstring injury, right, initial encoun  THERAPY DIAG:  Pain in right leg - Plan: PT plan of care cert/re-cert  Difficulty in walking, not elsewhere classified - Plan: PT plan of care cert/re-cert  Muscle weakness (generalized) - Plan: PT plan of care cert/re-cert  Cramp and spasm - Plan: PT plan of care cert/re-cert  Rationale for Evaluation and Treatment: Rehabilitation  ONSET DATE: 02/10/2024  SUBJECTIVE:   SUBJECTIVE STATEMENT: Patient states he was out in his yard picking up limbs and began having pain in right hip and buttock.  He is now having pain in this area on a fairly constant basis.  He has most of his pain in the right buttock.  Going up inclines, he feels the pain most obvious but also when walking a lot.  He has someone to do his yard work.  Gets out and is social, belongs to several groups and does things at church, but does not exercise on a regular basis.  He lives alone.    PERTINENT HISTORY: na PAIN:  Are you having pain? Yes: NPRS scale: 0/10,  at worst (sitting on toilet) 7-8/10 Pain location: right hip and buttock Pain description: aching, intense at times Aggravating factors: sitting on right hip and walking uphill Relieving factors: rest, lying flat  PRECAUTIONS: Fall  RED FLAGS: None   WEIGHT BEARING RESTRICTIONS: No  FALLS:  Has patient fallen in last 6 months? No  LIVING ENVIRONMENT: Lives with: lives alone Lives in: House/apartment Stairs: Yes: Internal: 12 steps; on right going up and External: 4 steps; on right going up Has following equipment  at home: None  OCCUPATION: retired  PLOF: Independent, Independent with basic ADLs, Independent with household mobility without device, Independent with community mobility without device, Independent with homemaking with ambulation, Independent with gait, and Independent with transfers  PATIENT GOALS: to get rid of the pain  NEXT MD VISIT: prn  OBJECTIVE:  Note: Objective measures were completed at Evaluation unless otherwise noted.  DIAGNOSTIC FINDINGS: na  PATIENT SURVEYS:  LEFS (completed on ipad) Extreme difficulty/unable (0), Quite a bit of difficulty (1), Moderate difficulty (2), Little difficulty (3), No difficulty (4) Survey date:  03/01/24  Any of your usual work, housework or school activities   2. Usual hobbies, recreational or sporting activities   3. Getting into/out of the bath   4. Walking between rooms   5. Putting on socks/shoes   6. Squatting    7. Lifting an object, like a bag of groceries from the floor   8. Performing light activities around your home   9. Performing heavy  activities around your home   10. Getting into/out of a car   11. Walking 2 blocks   12. Walking 1 mile   13. Going up/down 10 stairs (1 flight)   14. Standing for 1 hour   15.  sitting for 1 hour   16. Running on even ground   17. Running on uneven ground   18. Making sharp turns while running fast   19. Hopping    20. Rolling over in bed   Score total:  54/80     COGNITION: Overall cognitive status: Within functional limits for tasks assessed     SENSATION: WFL  MUSCLE LENGTH: Hamstrings: Right 70 deg; Left 70 deg Thomas test: Right pos; Left pos  POSTURE: rounded shoulders, forward head, decreased lumbar lordosis, and posterior pelvic tilt  PALPATION: na  LOWER EXTREMITY ROM:  WFL  LOWER EXTREMITY MMT:  Generally 4+ to 5/5  LOWER EXTREMITY SPECIAL TESTS:  Hip special tests: Belvie (FABER) test: negative, Thomas test: positive , Ober's test: positive , and  Piriformis test: positive   FUNCTIONAL TESTS:  5 times sit to stand: 10.70 sec Timed up and go (TUG): 9.88 sec  GAIT: Distance walked: 30 feet  Assistive device utilized: None Level of assistance: Complete Independence Comments: Occasional misstep, possible neuropathy                                                                                                                               TREATMENT DATE: 03/01/24 Initial eval completed and initiated HEP Educated on pain control, body mechanics, proper lifting technique and cross friction massage with LAX ball    PATIENT EDUCATION:  Education details: Educated on pain control, body mechanics, proper lifting technique and cross friction massage with LAX ball  Person educated: Patient Education method: Programmer, multimedia, Facilities manager, Verbal cues, and Handouts Education comprehension: verbalized understanding, returned demonstration, and verbal cues required  HOME EXERCISE PROGRAM: Access Code: [email or web address] URL: [email or web address] Date: 03/01/2024 Prepared by: Delon Haddock  Exercises - Standing Hamstring Stretch on Chair  - 1 x daily - 7 x weekly - 1 sets - 3 reps - 30 sec hold - Quadricep Stretch with Chair and Counter Support  - 1 x daily - 7 x weekly - 1 sets - 3 reps - 30 sec hold - Seated Figure 4 Piriformis Stretch  - 1 x daily - 7 x weekly - 1 sets - 3 reps - 30 sed hold  ASSESSMENT:  CLINICAL IMPRESSION: Patient is a 89 y.o. male who was seen today for physical therapy evaluation and treatment for right buttock and posterior thigh pain as well as balance issues.  He presents with fairly normal hip mobility and strength but pain in the right buttock and posterior thigh.  He does have some minor flexibility issues that we will address with stretching exercises.   He does not have positive SLR but symptoms may indicate some lumbar radiculopathy in addition to hamstring involvement.  This would be consistent with  his mechanism of injury of bending fwd repeatedly to pick up sticks in his yard for a long period of time using poor body mechanics.  He should respond well to LE flexibility exercises, balance training, posture and body mechanics training, and core strengthening.    OBJECTIVE IMPAIRMENTS: difficulty walking, decreased strength, increased fascial restrictions, increased muscle spasms, impaired flexibility, improper body mechanics, postural dysfunction, and pain.   ACTIVITY LIMITATIONS: carrying, lifting, bending, sitting, standing, squatting, sleeping, stairs, transfers, bed mobility, bathing, and dressing  PARTICIPATION LIMITATIONS: meal prep, cleaning, laundry, driving, shopping, community activity, and yard work  PERSONAL FACTORS: Age, Fitness, and 1-2 comorbidities: afib and neuropathy are also affecting patient's functional outcome.   REHAB POTENTIAL: Good  CLINICAL DECISION MAKING: Stable/uncomplicated  EVALUATION COMPLEXITY: Low   GOALS: Goals reviewed with patient? Yes  SHORT TERM GOALS: Target date: 03/29/2024  Pain report to be no greater than 4/10  Baseline: Goal status: INITIAL  2.  Patient will be independent with initial HEP  Baseline:  Goal status: INITIAL   LONG TERM GOALS: Target date: 04/26/2024  Patient to report pain no greater than 2/10  Baseline:  Goal status: INITIAL  2.  Patient to be independent with advanced HEP  Baseline:  Goal status: INITIAL  3.  Patient to be able to stand or walk for at least 15 min without leg pain  Baseline:  Goal status: INITIAL  4.  Patient to report 85% improvement in overall symptoms  Baseline:  Goal status: INITIAL  5.  LEFS score to improve to at least 60/80 Baseline:  Goal status: INITIAL  6.  Patient to be able to do SLS on each LE for at least 10 sec withouth LOB Baseline:  Goal status: INITIAL   PLAN:  PT FREQUENCY: 1-2x/week  PT DURATION: 8 weeks  PLANNED INTERVENTIONS: 97110-Therapeutic  exercises, 97530- Therapeutic activity, [id number]- Neuromuscular re-education, 97535- Self Care, [id number]- Manual therapy, [id number]- Gait training, [phone]- Canalith repositioning, [id number]- Aquatic Therapy, [phone]- Electrical stimulation (unattended), [phone]- Electrical stimulation (manual), [id number]- Vasopneumatic device, [id number]- Ultrasound, [id number]- Ionotophoresis 4mg /ml Dexamethasone, Patient/Family education, Balance training, Stair training, Taping, Joint mobilization, Spinal mobilization, Vestibular training, Visual/preceptual remediation/compensation, DME instructions, Cryotherapy, and Moist heat  PLAN FOR NEXT SESSION: Review HEP, Nustep, begin core strengthening, 4 way hip and balance training.     Delon B. Sabel Hornbeck, PT 03/01/24 9:30 AM [facility] [address], [address] [place], [place] [address] Phone # [phone] Fax [phone]

## 2024-03-08 ENCOUNTER — Ambulatory Visit

## 2024-03-08 DIAGNOSIS — M6281 Muscle weakness (generalized): Secondary | ICD-10-CM

## 2024-03-08 DIAGNOSIS — R2689 Other abnormalities of gait and mobility: Secondary | ICD-10-CM | POA: Diagnosis not present

## 2024-03-08 DIAGNOSIS — R252 Cramp and spasm: Secondary | ICD-10-CM

## 2024-03-08 DIAGNOSIS — R262 Difficulty in walking, not elsewhere classified: Secondary | ICD-10-CM

## 2024-03-08 DIAGNOSIS — M79604 Pain in right leg: Secondary | ICD-10-CM

## 2024-03-08 NOTE — Patient Instructions (Addendum)
 Tanner [name]

## 2024-03-08 NOTE — Therapy (Signed)
 OUTPATIENT PHYSICAL THERAPY LOWER EXTREMITY TREATMENT   Patient Name: Tanner [name] MRN: [id number] DOB:Jun 13, 1935, 89 y.o., male Today's Date: 03/08/2024  END OF SESSION:  PT End of Session - 03/08/24 1009     Visit Number 2    Number of Visits 16    Date for PT Re-Evaluation 04/26/24    Authorization Type [employer] [facility]    Authorization Time Period requested at initial eval- form completed and submitted; [employer] approved 8 visits 03/01/2024 - 04/26/2024    Authorization - Visit Number 2    Authorization - Number of Visits 8    PT Start Time 1004    PT Stop Time 1049    PT Time Calculation (min) 45 min    Activity Tolerance Patient tolerated treatment well    Behavior During Therapy [facility] for tasks assessed/performed          Past Medical History:  Diagnosis Date   Allergy    seasonal- mostly spring   Anemia 02/03/2011   ASTHMA, CHILDHOOD 03/17/2010   mostly as child   BCC (basal cell carcinoma) 12/08/2011   CAROTID ARTERY OCCLUSION, WITH INFARCTION 03/17/2010   CEREBROVASCULAR ACCIDENT 03/17/2010   partial loss of peripheral vision on left-slight improved   CHICKENPOX, HX OF 03/17/2010   Constipation 12/08/2014   Epistaxis 09/06/2013   FATIGUE 03/17/2010   Hearing loss 11/04/2010   bilateral   HYPERKALEMIA 05/27/2010   Hyperlipidemia    Hypertension    Hypothyroid 12/08/2011   Mixed hyperlipidemia 03/17/2010   Oral lesion 11/20/2014   Overweight(278.02) 07/01/2010   Peripheral neuropathy 08/18/2016   Personal history of other infectious and parasitic disease 03/17/2010   Preventative health care 08/23/2016   Skin lesion of right arm 11/20/2014   Sleep apnea    uses mouthpiece only   Thrombocytopenia (HCC) 04/05/2012   TOBACCO ABUSE, HX OF 03/17/2010   Tubular adenoma of colon 06/10/2011   Past Surgical History:  Procedure Laterality Date   CARDIOVERSION N/A 08/11/2019   Procedure: CARDIOVERSION;  Surgeon: Delford Maude BROCKS, MD;  Location: [facility] ENDOSCOPY;  Service: Cardiovascular;   Laterality: N/A;   CARDIOVERSION N/A 12/05/2021   Procedure: CARDIOVERSION;  Surgeon: Delford Maude BROCKS, MD;  Location: [facility] ENDOSCOPY;  Service: Cardiovascular;  Laterality: N/A;   CAROTID ENDARTERECTOMY Left February 13, 2013   cea   CATARACT EXTRACTION, BILATERAL Right    Cataract, and stigmatism   COLONOSCOPY  Dec. 2014   ENDARTERECTOMY Left 02/13/2013   Procedure: ENDARTERECTOMY CAROTID;  Surgeon: Krystal JULIANNA Doing, MD;  [facility];  Service: Vascular;  Laterality: Left;   EUS N/A 08/17/2013   Procedure: UPPER ENDOSCOPIC ULTRASOUND (EUS) LINEAR;  Surgeon: Toribio SHAUNNA Cedar, MD;  Location: [facility] ENDOSCOPY;  Service: Endoscopy;  Laterality: N/A;   knee cartliage repair  1964, 1990   bilateral   PILONIDAL CYST EXCISION  1968   removed   ROTATOR CUFF REPAIR     right   TONSILLECTOMY     UPPER GI ENDOSCOPY  Jan. 2015   Patient Active Problem List   Diagnosis Date Noted   Hamstring injury, right, initial encounter 02/10/2024   Balance disorder 02/10/2024   Disorder of vitamin B12 05/27/2022   Urinary frequency 05/27/2022   Greater trochanteric bursitis, left 02/02/2022   Insomnia 11/11/2021   Loss of transverse plantar arch 12/31/2020   Low vitamin D  level 11/08/2020   Degenerative arthritis of right knee 11/08/2020   Feeling grief 08/14/2020   Hypercoagulable state due to persistent atrial fibrillation (HCC) 08/02/2019  Persistent atrial fibrillation (HCC) 06/04/2019   Educated about COVID-19 virus infection 04/02/2019   Hyperglycemia 04/05/2018   Cough 04/05/2018   Preventative health care 08/23/2016   Peripheral neuropathy 08/18/2016   Constipation 12/08/2014   Skin lesion of right arm 11/20/2014   Oral lesion 11/20/2014   Medicare annual wellness visit, subsequent 05/27/2014   Occlusion and stenosis of carotid artery without mention of cerebral infarction 09/19/2013   Plantar fasciitis of left foot 05/22/2013   Varicose veins of leg with pain 05/22/2013   OSA (obstructive sleep  apnea) 12/03/2012   Thrombocytopenia (HCC) 04/05/2012   Hypothyroid 12/08/2011   BCC (basal cell carcinoma) 12/08/2011   Tubular adenoma of colon 06/10/2011   Anemia 02/03/2011   Hearing loss 11/04/2010   Overweight 07/01/2010   HYPERKALEMIA 05/27/2010   Mixed hyperlipidemia 03/17/2010   Essential hypertension, benign 03/17/2010   Mild atherosclerosis of carotid artery 03/17/2010   H/O: CVA (cerebrovascular accident) 03/17/2010   ALLERGIC RHINITIS CAUSE UNSPECIFIED 03/17/2010   ASTHMA, CHILDHOOD 03/17/2010   TOBACCO ABUSE, HX OF 03/17/2010   CHICKENPOX, HX OF 03/17/2010    PCP:    [name] LABOR, MD    REFERRING PROVIDER: Claudene Arthea HERO, DO  REFERRING DIAG: R26.89 (ICD-10-CM) - Balance disorder S76.301A (ICD-10-CM) - Hamstring injury, right, initial encoun  THERAPY DIAG:  Pain in right leg  Difficulty in walking, not elsewhere classified  Muscle weakness (generalized)  Cramp and spasm  Rationale for Evaluation and Treatment: Rehabilitation  ONSET DATE: 02/10/2024  SUBJECTIVE:   SUBJECTIVE STATEMENT: I really only have pain with prolonged sitting. Also with increased walking pain will start to move down my posterior thigh.   PERTINENT HISTORY: na PAIN:  Are you having pain? Yes: NPRS scale: 0/10,  at worst (sitting on toilet) 7-8/10 Pain location: right hip and buttock Pain description: aching, intense at times Aggravating factors: sitting on right hip and walking uphill Relieving factors: rest, lying flat  PRECAUTIONS: Fall  RED FLAGS: None   WEIGHT BEARING RESTRICTIONS: No  FALLS:  Has patient fallen in last 6 months? No  LIVING ENVIRONMENT: Lives with: lives alone Lives in: House/apartment Stairs: Yes: Internal: 12 steps; on right going up and External: 4 steps; on right going up Has following equipment at home: None  OCCUPATION: retired  PLOF: Independent, Independent with basic ADLs, Independent with household mobility without device,  Independent with community mobility without device, Independent with homemaking with ambulation, Independent with gait, and Independent with transfers  PATIENT GOALS: to get rid of the pain  NEXT MD VISIT: prn  OBJECTIVE:  Note: Objective measures were completed at Evaluation unless otherwise noted.  DIAGNOSTIC FINDINGS: na  PATIENT SURVEYS:  LEFS (completed on ipad) Extreme difficulty/unable (0), Quite a bit of difficulty (1), Moderate difficulty (2), Little difficulty (3), No difficulty (4) Survey date:  03/01/24  Any of your usual work, housework or school activities   2. Usual hobbies, recreational or sporting activities   3. Getting into/out of the bath   4. Walking between rooms   5. Putting on socks/shoes   6. Squatting    7. Lifting an object, like a bag of groceries from the floor   8. Performing light activities around your home   9. Performing heavy activities around your home   10. Getting into/out of a car   11. Walking 2 blocks   12. Walking 1 mile   13. Going up/down 10 stairs (1 flight)   14. Standing for 1 hour   15.  sitting for 1 hour   16. Running on even ground   17. Running on uneven ground   18. Making sharp turns while running fast   19. Hopping    20. Rolling over in bed   Score total:  54/80     COGNITION: Overall cognitive status: Within functional limits for tasks assessed     SENSATION: WFL  MUSCLE LENGTH: Hamstrings: Right 70 deg; Left 70 deg Thomas test: Right pos; Left pos  POSTURE: rounded shoulders, forward head, decreased lumbar lordosis, and posterior pelvic tilt  PALPATION: na  LOWER EXTREMITY ROM:  WFL  LOWER EXTREMITY MMT:  Generally 4+ to 5/5  LOWER EXTREMITY SPECIAL TESTS:  Hip special tests: Belvie (FABER) test: negative, Thomas test: positive , Ober's test: positive , and Piriformis test: positive   FUNCTIONAL TESTS:  5 times sit to stand: 10.70 sec Timed up and go (TUG): 9.88 sec  GAIT: Distance walked:  30 feet  Assistive device utilized: None Level of assistance: Complete Independence Comments: Occasional misstep, possible neuropathy                                                                                                                               TREATMENT DATE:  03/08/24: Therapeutic Exercise Nustep: Level 7 UE10/LE 12, x 6 mins, pt reports no increase in buttock pain, but some increased discomfort in Rt knee. (Had surgery 50 years ago and feels like bone on bone) Seated EOB for bil HS stretch and piriformis stretch x 2 reps, 20 sec holds with VC's for technique Neuromuscular Re Ed In // bars: Heel-toe front and retro walking 2 laps each, slow and controlled high knee marching x 4 laps; crossover in front x 2 each way, then crossover in back x 2 laps returning therapist demo. Then SLS on blue oval for hip 3 way raises returning therapist demo for each x 12 each and VC's during  Therapeutic Activities Bridges x 10 Posterior pelvic tilt x 10 returning demo, then tilt with clams 2 x 10, challenging for pt to hold tilt so did not progress with marching today   03/01/24 Initial eval completed and initiated HEP Educated on pain control, body mechanics, proper lifting technique and cross friction massage with LAX ball    PATIENT EDUCATION:  Education details: Educated on pain control, body mechanics, proper lifting technique and cross friction massage with LAX ball  Person educated: Patient Education method: Programmer, multimedia, Facilities manager, Verbal cues, and Handouts Education comprehension: verbalized understanding, returned demonstration, and verbal cues required  HOME EXERCISE PROGRAM: Access Code: [email or web address] URL: [email or web address] Date: 03/01/2024 Prepared by: Delon Haddock  Exercises - Standing Hamstring Stretch on Chair  - 1 x daily - 7 x weekly - 1 sets - 3 reps - 30 sec hold - Quadricep Stretch with Chair and Counter Support  - 1 x daily - 7 x weekly - 1  sets - 3 reps - 30 sec hold - Seated Figure  4 Piriformis Stretch  - 1 x daily - 7 x weekly - 1 sets - 3 reps - 30 sed hold  ASSESSMENT:  CLINICAL IMPRESSION: Reviewed HEP stretches. Pt is doing these well. Began balance activities, hip 4 way and core strength today per initial POC. Pt will benefit from continued physical therapy to progress him with core and hip strengthening along with LE flexibility activities.   OBJECTIVE IMPAIRMENTS: difficulty walking, decreased strength, increased fascial restrictions, increased muscle spasms, impaired flexibility, improper body mechanics, postural dysfunction, and pain.   ACTIVITY LIMITATIONS: carrying, lifting, bending, sitting, standing, squatting, sleeping, stairs, transfers, bed mobility, bathing, and dressing  PARTICIPATION LIMITATIONS: meal prep, cleaning, laundry, driving, shopping, community activity, and yard work  PERSONAL FACTORS: Age, Fitness, and 1-2 comorbidities: afib and neuropathy are also affecting patient's functional outcome.   REHAB POTENTIAL: Good  CLINICAL DECISION MAKING: Stable/uncomplicated  EVALUATION COMPLEXITY: Low   GOALS: Goals reviewed with patient? Yes  SHORT TERM GOALS: Target date: 03/29/2024  Pain report to be no greater than 4/10  Baseline: Goal status: INITIAL  2.  Patient will be independent with initial HEP  Baseline:  Goal status: INITIAL   LONG TERM GOALS: Target date: 04/26/2024  Patient to report pain no greater than 2/10  Baseline:  Goal status: INITIAL  2.  Patient to be independent with advanced HEP  Baseline:  Goal status: INITIAL  3.  Patient to be able to stand or walk for at least 15 min without leg pain  Baseline:  Goal status: INITIAL  4.  Patient to report 85% improvement in overall symptoms  Baseline:  Goal status: INITIAL  5.  LEFS score to improve to at least 60/80 Baseline:  Goal status: INITIAL  6.  Patient to be able to do SLS on each LE for at least 10 sec  withouth LOB Baseline:  Goal status: INITIAL   PLAN:  PT FREQUENCY: 1-2x/week  PT DURATION: 8 weeks  PLANNED INTERVENTIONS: 97110-Therapeutic exercises, 97530- Therapeutic activity, [id number]- Neuromuscular re-education, 97535- Self Care, [id number]- Manual therapy, [id number]- Gait training, [phone]- Canalith repositioning, [id number]- Aquatic Therapy, [phone]- Electrical stimulation (unattended), [phone]- Electrical stimulation (manual), [id number]- Vasopneumatic device, [id number]- Ultrasound, [id number]- Ionotophoresis 4mg /ml Dexamethasone, Patient/Family education, Balance training, Stair training, Taping, Joint mobilization, Spinal mobilization, Vestibular training, Visual/preceptual remediation/compensation, DME instructions, Cryotherapy, and Moist heat  PLAN FOR NEXT SESSION: Cont Nustep, progress core strengthening, and cont 4 way hip and balance training.     Berwyn Knights, PTA 03/08/24 10:58 AM  [facility] [address], [address] [place], [place] [address] Phone # [phone] Fax [phone]          Cancer Rehab [phone] HIP: Flexion Standing    Holding onto counter lift leg straight in front keeping knee straight. Slow and controlled! _10__ reps per set, _2-3__ sets per day. Then repeat with other leg.   Hip Extension (Standing)    Stand with support at counter. Squeeze pelvic floor and hold so as not to twist hips and don't lean forward.  Move right leg backward with straight knee. Slow and controlled! Repeat _10__ times. Do _2-3__ times a day. Repeat with other leg.  Hip Abduction (Standing)    Stand with support. Squeeze pelvic floor and hold. Lift right leg out to side, keeping toe forward.  Repeat _10__ times. Do _2-3__ times a day. Repeat with other leg.

## 2024-03-09 ENCOUNTER — Other Ambulatory Visit (HOSPITAL_COMMUNITY): Payer: Self-pay

## 2024-03-09 MED ORDER — METOPROLOL SUCCINATE ER 25 MG PO TB24: ORAL_TABLET | ORAL | 1 refills | Status: AC

## 2024-03-10 ENCOUNTER — Ambulatory Visit: Admitting: Physical Therapy

## 2024-03-10 ENCOUNTER — Encounter

## 2024-03-10 DIAGNOSIS — R262 Difficulty in walking, not elsewhere classified: Secondary | ICD-10-CM

## 2024-03-10 DIAGNOSIS — M79604 Pain in right leg: Secondary | ICD-10-CM

## 2024-03-10 DIAGNOSIS — R2689 Other abnormalities of gait and mobility: Secondary | ICD-10-CM | POA: Diagnosis not present

## 2024-03-10 DIAGNOSIS — R252 Cramp and spasm: Secondary | ICD-10-CM

## 2024-03-10 DIAGNOSIS — M6281 Muscle weakness (generalized): Secondary | ICD-10-CM

## 2024-03-10 NOTE — Therapy (Signed)
 OUTPATIENT PHYSICAL THERAPY LOWER EXTREMITY TREATMENT   Patient Name: Tanner [name] MRN: [id number] DOB:1935-02-03, 89 y.o., male Today's Date: 03/10/2024  END OF SESSION:  PT End of Session - 03/10/24 1016     Visit Number 3    Date for PT Re-Evaluation 04/26/24    Authorization Time Period requested at initial eval- form completed and submitted; [employer] approved 8 visits 03/01/2024 - 04/26/2024    Authorization - Visit Number 3    Authorization - Number of Visits 8    Progress Note Due on Visit 10    PT Start Time 1016    PT Stop Time 1055    PT Time Calculation (min) 39 min    Activity Tolerance Patient tolerated treatment well          Past Medical History:  Diagnosis Date   Allergy    seasonal- mostly spring   Anemia 02/03/2011   ASTHMA, CHILDHOOD 03/17/2010   mostly as child   BCC (basal cell carcinoma) 12/08/2011   CAROTID ARTERY OCCLUSION, WITH INFARCTION 03/17/2010   CEREBROVASCULAR ACCIDENT 03/17/2010   partial loss of peripheral vision on left-slight improved   CHICKENPOX, HX OF 03/17/2010   Constipation 12/08/2014   Epistaxis 09/06/2013   FATIGUE 03/17/2010   Hearing loss 11/04/2010   bilateral   HYPERKALEMIA 05/27/2010   Hyperlipidemia    Hypertension    Hypothyroid 12/08/2011   Mixed hyperlipidemia 03/17/2010   Oral lesion 11/20/2014   Overweight(278.02) 07/01/2010   Peripheral neuropathy 08/18/2016   Personal history of other infectious and parasitic disease 03/17/2010   Preventative health care 08/23/2016   Skin lesion of right arm 11/20/2014   Sleep apnea    uses mouthpiece only   Thrombocytopenia (HCC) 04/05/2012   TOBACCO ABUSE, HX OF 03/17/2010   Tubular adenoma of colon 06/10/2011   Past Surgical History:  Procedure Laterality Date   CARDIOVERSION N/A 08/11/2019   Procedure: CARDIOVERSION;  Surgeon: Delford Maude BROCKS, MD;  Location: [facility] ENDOSCOPY;  Service: Cardiovascular;  Laterality: N/A;   CARDIOVERSION N/A 12/05/2021   Procedure: CARDIOVERSION;  Surgeon:  Delford Maude BROCKS, MD;  Location: [facility] ENDOSCOPY;  Service: Cardiovascular;  Laterality: N/A;   CAROTID ENDARTERECTOMY Left February 13, 2013   cea   CATARACT EXTRACTION, BILATERAL Right    Cataract, and stigmatism   COLONOSCOPY  Dec. 2014   ENDARTERECTOMY Left 02/13/2013   Procedure: ENDARTERECTOMY CAROTID;  Surgeon: Krystal JULIANNA Doing, MD;  Location: [facility] OR;  Service: Vascular;  Laterality: Left;   EUS N/A 08/17/2013   Procedure: UPPER ENDOSCOPIC ULTRASOUND (EUS) LINEAR;  Surgeon: Toribio SHAUNNA Cedar, MD;  Location: [facility] ENDOSCOPY;  Service: Endoscopy;  Laterality: N/A;   knee cartliage repair  1964, 1990   bilateral   PILONIDAL CYST EXCISION  1968   removed   ROTATOR CUFF REPAIR     right   TONSILLECTOMY     UPPER GI ENDOSCOPY  Jan. 2015   Patient Active Problem List   Diagnosis Date Noted   Hamstring injury, right, initial encounter 02/10/2024   Balance disorder 02/10/2024   Disorder of vitamin B12 05/27/2022   Urinary frequency 05/27/2022   Greater trochanteric bursitis, left 02/02/2022   Insomnia 11/11/2021   Loss of transverse plantar arch 12/31/2020   Low vitamin D  level 11/08/2020   Degenerative arthritis of right knee 11/08/2020   Feeling grief 08/14/2020   Hypercoagulable state due to persistent atrial fibrillation (HCC) 08/02/2019   Persistent atrial fibrillation (HCC) 06/04/2019   Educated about COVID-19 virus infection 04/02/2019  Hyperglycemia 04/05/2018   Cough 04/05/2018   Preventative health care 08/23/2016   Peripheral neuropathy 08/18/2016   Constipation 12/08/2014   Skin lesion of right arm 11/20/2014   Oral lesion 11/20/2014   Medicare annual wellness visit, subsequent 05/27/2014   Occlusion and stenosis of carotid artery without mention of cerebral infarction 09/19/2013   Plantar fasciitis of left foot 05/22/2013   Varicose veins of leg with pain 05/22/2013   OSA (obstructive sleep apnea) 12/03/2012   Thrombocytopenia (HCC) 04/05/2012   Hypothyroid 12/08/2011   BCC  (basal cell carcinoma) 12/08/2011   Tubular adenoma of colon 06/10/2011   Anemia 02/03/2011   Hearing loss 11/04/2010   Overweight 07/01/2010   HYPERKALEMIA 05/27/2010   Mixed hyperlipidemia 03/17/2010   Essential hypertension, benign 03/17/2010   Mild atherosclerosis of carotid artery 03/17/2010   H/O: CVA (cerebrovascular accident) 03/17/2010   ALLERGIC RHINITIS CAUSE UNSPECIFIED 03/17/2010   ASTHMA, CHILDHOOD 03/17/2010   TOBACCO ABUSE, HX OF 03/17/2010   CHICKENPOX, HX OF 03/17/2010    PCP:    [name] LABOR, MD    REFERRING PROVIDER: Claudene Arthea HERO, DO  REFERRING DIAG: R26.89 (ICD-10-CM) - Balance disorder S76.301A (ICD-10-CM) - Hamstring injury, right, initial encoun  THERAPY DIAG:  Pain in right leg  Difficulty in walking, not elsewhere classified  Muscle weakness (generalized)  Cramp and spasm  Rationale for Evaluation and Treatment: Rehabilitation  ONSET DATE: 02/10/2024  SUBJECTIVE:   SUBJECTIVE STATEMENT: Sitting in a straight chair last night and that produces buttock pain.  No pain with lying down.  Sometimes feel it with walking.    PERTINENT HISTORY: na PAIN:  Are you having pain? Yes: NPRS scale: 0/10 Pain location: right hip and buttock Pain description: aching, intense at times Aggravating factors: sitting on right hip and walking uphill Relieving factors: rest, lying flat  PRECAUTIONS: Fall  RED FLAGS: None   WEIGHT BEARING RESTRICTIONS: No  FALLS:  Has patient fallen in last 6 months? No  LIVING ENVIRONMENT: Lives with: lives alone Lives in: House/apartment Stairs: Yes: Internal: 12 steps; on right going up and External: 4 steps; on right going up Has following equipment at home: None  OCCUPATION: retired  PLOF: Independent, Independent with basic ADLs, Independent with household mobility without device, Independent with community mobility without device, Independent with homemaking with ambulation, Independent with gait,  and Independent with transfers  PATIENT GOALS: to get rid of the pain  NEXT MD VISIT: prn  OBJECTIVE:  Note: Objective measures were completed at Evaluation unless otherwise noted.  DIAGNOSTIC FINDINGS: na  PATIENT SURVEYS:  LEFS (completed on ipad) Extreme difficulty/unable (0), Quite a bit of difficulty (1), Moderate difficulty (2), Little difficulty (3), No difficulty (4) Survey date:  03/01/24  Any of your usual work, housework or school activities   2. Usual hobbies, recreational or sporting activities   3. Getting into/out of the bath   4. Walking between rooms   5. Putting on socks/shoes   6. Squatting    7. Lifting an object, like a bag of groceries from the floor   8. Performing light activities around your home   9. Performing heavy activities around your home   10. Getting into/out of a car   11. Walking 2 blocks   12. Walking 1 mile   13. Going up/down 10 stairs (1 flight)   14. Standing for 1 hour   15.  sitting for 1 hour   16. Running on even ground   17. Running on uneven  ground   18. Making sharp turns while running fast   19. Hopping    20. Rolling over in bed   Score total:  54/80     COGNITION: Overall cognitive status: Within functional limits for tasks assessed     SENSATION: WFL  MUSCLE LENGTH: Hamstrings: Right 70 deg; Left 70 deg Thomas test: Right pos; Left pos  POSTURE: rounded shoulders, forward head, decreased lumbar lordosis, and posterior pelvic tilt  PALPATION: na  LOWER EXTREMITY ROM:  WFL  LOWER EXTREMITY MMT:  Generally 4+ to 5/5  LOWER EXTREMITY SPECIAL TESTS:  Hip special tests: Belvie (FABER) test: negative, Thomas test: positive , Ober's test: positive , and Piriformis test: positive   FUNCTIONAL TESTS:  5 times sit to stand: 10.70 sec Timed up and go (TUG): 9.88 sec  GAIT: Distance walked: 30 feet  Assistive device utilized: None Level of assistance: Complete Independence Comments: Occasional misstep,  possible neuropathy                                                                                                                               TREATMENT DATE:  03/10/24: Nustep: Level 5 UE10/LE 12, x 5 mins At the stairs 2nd step hip flexor, gastroc and quadratus lumborum muscle lengthening 3 sets of 5 right/left: Rocking forward and back Rocking forward and back with arm elevation Rocking forward and back with arm reach up and over Prone glute squeezes 10x 2 sets  Prone over 2 pillows: hip extension 2 sets  Supine posterior pelvic tilts 10x Bridges 5x  Sidelying clams 10x right only Standing modified single leg RDL reach to knee level stool 10x right/left (mild symptoms produced on with right LE WB) Education on expected course and time for recovery         03/08/24: Therapeutic Exercise Nustep: Level 7 UE10/LE 12, x 6 mins, pt reports no increase in buttock pain, but some increased discomfort in Rt knee. (Had surgery 50 years ago and feels like bone on bone) Seated EOB for bil HS stretch and piriformis stretch x 2 reps, 20 sec holds with VC's for technique Neuromuscular Re Ed In // bars: Heel-toe front and retro walking 2 laps each, slow and controlled high knee marching x 4 laps; crossover in front x 2 each way, then crossover in back x 2 laps returning therapist demo. Then SLS on blue oval for hip 3 way raises returning therapist demo for each x 12 each and VC's during  Therapeutic Activities Bridges x 10 Posterior pelvic tilt x 10 returning demo, then tilt with clams 2 x 10, challenging for pt to hold tilt so did not progress with marching today   03/01/24 Initial eval completed and initiated HEP Educated on pain control, body mechanics, proper lifting technique and cross friction massage with LAX ball    PATIENT EDUCATION:  Education details: Educated on pain control, body mechanics, proper lifting technique and cross friction massage with LAX  ball  Person educated:  Patient Education method: Explanation, Demonstration, Verbal cues, and Handouts Education comprehension: verbalized understanding, returned demonstration, and verbal cues required  HOME EXERCISE PROGRAM: Access Code: [email or web address] URL: [email or web address] Date: 03/01/2024 Prepared by: Delon Haddock  Exercises - Standing Hamstring Stretch on Chair  - 1 x daily - 7 x weekly - 1 sets - 3 reps - 30 sec hold - Quadricep Stretch with Chair and Counter Support  - 1 x daily - 7 x weekly - 1 sets - 3 reps - 30 sec hold - Seated Figure 4 Piriformis Stretch  - 1 x daily - 7 x weekly - 1 sets - 3 reps - 30 sed hold  ASSESSMENT:  CLINICAL IMPRESSION: Symptoms aggravated particularly with sitting/compression of soft tissue. He is able to perform low level mobility ex's with mild symptoms produced but remain < 5/10.  Therapist providing verbal cues to optimize technique with exercises in order to achieve the greatest benefit.  Therapist also closely monitoring response and modifying interventions accordingly.       OBJECTIVE IMPAIRMENTS: difficulty walking, decreased strength, increased fascial restrictions, increased muscle spasms, impaired flexibility, improper body mechanics, postural dysfunction, and pain.   ACTIVITY LIMITATIONS: carrying, lifting, bending, sitting, standing, squatting, sleeping, stairs, transfers, bed mobility, bathing, and dressing  PARTICIPATION LIMITATIONS: meal prep, cleaning, laundry, driving, shopping, community activity, and yard work  PERSONAL FACTORS: Age, Fitness, and 1-2 comorbidities: afib and neuropathy are also affecting patient's functional outcome.   REHAB POTENTIAL: Good  CLINICAL DECISION MAKING: Stable/uncomplicated  EVALUATION COMPLEXITY: Low   GOALS: Goals reviewed with patient? Yes  SHORT TERM GOALS: Target date: 03/29/2024  Pain report to be no greater than 4/10  Baseline: Goal status: INITIAL  2.  Patient will be independent  with initial HEP  Baseline:  Goal status: INITIAL   LONG TERM GOALS: Target date: 04/26/2024  Patient to report pain no greater than 2/10  Baseline:  Goal status: INITIAL  2.  Patient to be independent with advanced HEP  Baseline:  Goal status: INITIAL  3.  Patient to be able to stand or walk for at least 15 min without leg pain  Baseline:  Goal status: INITIAL  4.  Patient to report 85% improvement in overall symptoms  Baseline:  Goal status: INITIAL  5.  LEFS score to improve to at least 60/80 Baseline:  Goal status: INITIAL  6.  Patient to be able to do SLS on each LE for at least 10 sec withouth LOB Baseline:  Goal status: INITIAL   PLAN:  PT FREQUENCY: 1-2x/week  PT DURATION: 8 weeks  PLANNED INTERVENTIONS: 97110-Therapeutic exercises, 97530- Therapeutic activity, [id number]- Neuromuscular re-education, 97535- Self Care, [id number]- Manual therapy, [id number]- Gait training, [phone]- Canalith repositioning, [id number]- Aquatic Therapy, [phone]- Electrical stimulation (unattended), [phone]- Electrical stimulation (manual), [id number]- Vasopneumatic device, [id number]- Ultrasound, [id number]- Ionotophoresis 4mg /ml Dexamethasone, Patient/Family education, Balance training, Stair training, Taping, Joint mobilization, Spinal mobilization, Vestibular training, Visual/preceptual remediation/compensation, DME instructions, Cryotherapy, and Moist heat  PLAN FOR NEXT SESSION: Cont Nustep (although limit time), progress core strengthening, and cont 4 way hip and balance training.     Glade Pesa, PT 03/10/24 5:31 PM Phone: [phone] Fax: [phone]   [facility] [address] [place], [place] [address] Phone # [phone] Fax [phone]          Cancer Rehab [phone] HIP: Flexion Standing    Holding onto counter lift leg straight in front keeping knee straight. Slow and controlled! _10__ reps per set, _2-3__  sets per day. Then repeat with other leg.   Hip  Extension (Standing)    Stand with support at counter. Squeeze pelvic floor and hold so as not to twist hips and don't lean forward.  Move right leg backward with straight knee. Slow and controlled! Repeat _10__ times. Do _2-3__ times a day. Repeat with other leg.  Hip Abduction (Standing)    Stand with support. Squeeze pelvic floor and hold. Lift right leg out to side, keeping toe forward.  Repeat _10__ times. Do _2-3__ times a day. Repeat with other leg.

## 2024-03-15 ENCOUNTER — Ambulatory Visit

## 2024-03-15 DIAGNOSIS — M79604 Pain in right leg: Secondary | ICD-10-CM

## 2024-03-15 DIAGNOSIS — R262 Difficulty in walking, not elsewhere classified: Secondary | ICD-10-CM

## 2024-03-15 DIAGNOSIS — R2689 Other abnormalities of gait and mobility: Secondary | ICD-10-CM | POA: Diagnosis not present

## 2024-03-15 DIAGNOSIS — R252 Cramp and spasm: Secondary | ICD-10-CM

## 2024-03-15 DIAGNOSIS — R293 Abnormal posture: Secondary | ICD-10-CM

## 2024-03-15 DIAGNOSIS — M6281 Muscle weakness (generalized): Secondary | ICD-10-CM

## 2024-03-15 NOTE — Therapy (Unsigned)
 OUTPATIENT PHYSICAL THERAPY LOWER EXTREMITY TREATMENT   Patient Name: Tanner [name] MRN: [id number] DOB:12/18/1934, 89 y.o., male Today's Date: 03/16/2024  END OF SESSION:  PT End of Session - 03/15/24 1021     Visit Number 4    Number of Visits 8    Date for PT Re-Evaluation 04/26/24    Authorization Type [employer]    Authorization Time Period [employer] approved 8 visits 03/01/2024 - 04/26/2024    Authorization - Visit Number 4    Authorization - Number of Visits 8    Progress Note Due on Visit 10    PT Start Time 1018    PT Stop Time 1100    PT Time Calculation (min) 42 min    Activity Tolerance Patient tolerated treatment well    Behavior During Therapy [facility] for tasks assessed/performed          Past Medical History:  Diagnosis Date   Allergy    seasonal- mostly spring   Anemia 02/03/2011   ASTHMA, CHILDHOOD 03/17/2010   mostly as child   BCC (basal cell carcinoma) 12/08/2011   CAROTID ARTERY OCCLUSION, WITH INFARCTION 03/17/2010   CEREBROVASCULAR ACCIDENT 03/17/2010   partial loss of peripheral vision on left-slight improved   CHICKENPOX, HX OF 03/17/2010   Constipation 12/08/2014   Epistaxis 09/06/2013   FATIGUE 03/17/2010   Hearing loss 11/04/2010   bilateral   HYPERKALEMIA 05/27/2010   Hyperlipidemia    Hypertension    Hypothyroid 12/08/2011   Mixed hyperlipidemia 03/17/2010   Oral lesion 11/20/2014   Overweight(278.02) 07/01/2010   Peripheral neuropathy 08/18/2016   Personal history of other infectious and parasitic disease 03/17/2010   Preventative health care 08/23/2016   Skin lesion of right arm 11/20/2014   Sleep apnea    uses mouthpiece only   Thrombocytopenia (HCC) 04/05/2012   TOBACCO ABUSE, HX OF 03/17/2010   Tubular adenoma of colon 06/10/2011   Past Surgical History:  Procedure Laterality Date   CARDIOVERSION N/A 08/11/2019   Procedure: CARDIOVERSION;  Surgeon: Delford Maude BROCKS, MD;  Location: [facility] ENDOSCOPY;  Service: Cardiovascular;  Laterality: N/A;    CARDIOVERSION N/A 12/05/2021   Procedure: CARDIOVERSION;  Surgeon: Delford Maude BROCKS, MD;  Location: [facility] ENDOSCOPY;  Service: Cardiovascular;  Laterality: N/A;   CAROTID ENDARTERECTOMY Left February 13, 2013   cea   CATARACT EXTRACTION, BILATERAL Right    Cataract, and stigmatism   COLONOSCOPY  Dec. 2014   ENDARTERECTOMY Left 02/13/2013   Procedure: ENDARTERECTOMY CAROTID;  Surgeon: Krystal JULIANNA Doing, MD;  Location: [facility] OR;  Service: Vascular;  Laterality: Left;   EUS N/A 08/17/2013   Procedure: UPPER ENDOSCOPIC ULTRASOUND (EUS) LINEAR;  Surgeon: Toribio SHAUNNA Cedar, MD;  Location: [facility] ENDOSCOPY;  Service: Endoscopy;  Laterality: N/A;   knee cartliage repair  1964, 1990   bilateral   PILONIDAL CYST EXCISION  1968   removed   ROTATOR CUFF REPAIR     right   TONSILLECTOMY     UPPER GI ENDOSCOPY  Jan. 2015   Patient Active Problem List   Diagnosis Date Noted   Hamstring injury, right, initial encounter 02/10/2024   Balance disorder 02/10/2024   Disorder of vitamin B12 05/27/2022   Urinary frequency 05/27/2022   Greater trochanteric bursitis, left 02/02/2022   Insomnia 11/11/2021   Loss of transverse plantar arch 12/31/2020   Low vitamin D  level 11/08/2020   Degenerative arthritis of right knee 11/08/2020   Feeling grief 08/14/2020   Hypercoagulable state due to persistent atrial fibrillation (HCC)  08/02/2019   Persistent atrial fibrillation (HCC) 06/04/2019   Educated about COVID-19 virus infection 04/02/2019   Hyperglycemia 04/05/2018   Cough 04/05/2018   Preventative health care 08/23/2016   Peripheral neuropathy 08/18/2016   Constipation 12/08/2014   Skin lesion of right arm 11/20/2014   Oral lesion 11/20/2014   Medicare annual wellness visit, subsequent 05/27/2014   Occlusion and stenosis of carotid artery without mention of cerebral infarction 09/19/2013   Plantar fasciitis of left foot 05/22/2013   Varicose veins of leg with pain 05/22/2013   OSA (obstructive sleep apnea) 12/03/2012    Thrombocytopenia (HCC) 04/05/2012   Hypothyroid 12/08/2011   BCC (basal cell carcinoma) 12/08/2011   Tubular adenoma of colon 06/10/2011   Anemia 02/03/2011   Hearing loss 11/04/2010   Overweight 07/01/2010   HYPERKALEMIA 05/27/2010   Mixed hyperlipidemia 03/17/2010   Essential hypertension, benign 03/17/2010   Mild atherosclerosis of carotid artery 03/17/2010   H/O: CVA (cerebrovascular accident) 03/17/2010   ALLERGIC RHINITIS CAUSE UNSPECIFIED 03/17/2010   ASTHMA, CHILDHOOD 03/17/2010   TOBACCO ABUSE, HX OF 03/17/2010   CHICKENPOX, HX OF 03/17/2010    PCP:    [name] LABOR, MD    REFERRING PROVIDER: Claudene Arthea HERO, DO  REFERRING DIAG: R26.89 (ICD-10-CM) - Balance disorder S76.301A (ICD-10-CM) - Hamstring injury, right, initial encoun  THERAPY DIAG:  Pain in right leg  Difficulty in walking, not elsewhere classified  Muscle weakness (generalized)  Cramp and spasm  Abnormal posture  Rationale for Evaluation and Treatment: Rehabilitation  ONSET DATE: 02/10/2024  SUBJECTIVE:   SUBJECTIVE STATEMENT: Patient reports buttock pain is now moving laterally into the lateral hip.   PERTINENT HISTORY: na PAIN:  Are you having pain? Yes: NPRS scale: 0/10 Pain location: right hip and buttock Pain description: aching, intense at times Aggravating factors: sitting on right hip and walking uphill Relieving factors: rest, lying flat  PRECAUTIONS: Fall  RED FLAGS: None   WEIGHT BEARING RESTRICTIONS: No  FALLS:  Has patient fallen in last 6 months? No  LIVING ENVIRONMENT: Lives with: lives alone Lives in: House/apartment Stairs: Yes: Internal: 12 steps; on right going up and External: 4 steps; on right going up Has following equipment at home: None  OCCUPATION: retired  PLOF: Independent, Independent with basic ADLs, Independent with household mobility without device, Independent with community mobility without device, Independent with homemaking with  ambulation, Independent with gait, and Independent with transfers  PATIENT GOALS: to get rid of the pain  NEXT MD VISIT: prn  OBJECTIVE:  Note: Objective measures were completed at Evaluation unless otherwise noted.  DIAGNOSTIC FINDINGS: na  PATIENT SURVEYS:  LEFS (completed on ipad) Extreme difficulty/unable (0), Quite a bit of difficulty (1), Moderate difficulty (2), Little difficulty (3), No difficulty (4) Survey date:  03/01/24  Any of your usual work, housework or school activities   2. Usual hobbies, recreational or sporting activities   3. Getting into/out of the bath   4. Walking between rooms   5. Putting on socks/shoes   6. Squatting    7. Lifting an object, like a bag of groceries from the floor   8. Performing light activities around your home   9. Performing heavy activities around your home   10. Getting into/out of a car   11. Walking 2 blocks   12. Walking 1 mile   13. Going up/down 10 stairs (1 flight)   14. Standing for 1 hour   15.  sitting for 1 hour   16. Running on  even ground   17. Running on uneven ground   18. Making sharp turns while running fast   19. Hopping    20. Rolling over in bed   Score total:  54/80     COGNITION: Overall cognitive status: Within functional limits for tasks assessed     SENSATION: WFL  MUSCLE LENGTH: Hamstrings: Right 70 deg; Left 70 deg Thomas test: Right pos; Left pos  POSTURE: rounded shoulders, forward head, decreased lumbar lordosis, and posterior pelvic tilt  PALPATION: na  LOWER EXTREMITY ROM:  WFL  LOWER EXTREMITY MMT:  Generally 4+ to 5/5  LOWER EXTREMITY SPECIAL TESTS:  Hip special tests: Belvie (FABER) test: negative, Thomas test: positive , Ober's test: positive , and Piriformis test: positive   FUNCTIONAL TESTS:  5 times sit to stand: 10.70 sec Timed up and go (TUG): 9.88 sec  GAIT: Distance walked: 30 feet  Assistive device utilized: None Level of assistance: Complete  Independence Comments: Occasional misstep, possible neuropathy                                                                                                                               TREATMENT DATE:  03/15/24: Nustep: Level 5 UE10/LE 12, x 5 mins Standing hamstring stretch at steps 2 x 30 sec Standing quad/hip flexor stretch 2 x 30 sec Seated piriformis stretch 2 x 30 sec Stair training/observation: patient able to do reciprocal steps up and down with ease (he had mentioned that he gets right leg pain if he doesn't get his whole foot on step but he says this just occasionally happens.  Suggested just being more conscious  Step up 6 2 x 10 Lateral heel tap 2 x 10 4 Squats to mat table 2 x 10 Discussed the condition of the right knee and likely need for TKA but patient states he is adamant that he doesn't want to do the TKA.  We discussed how the pain inhibits the ability for the quad to build and that he will likely meet a plateau with therapy.  He returns understanding of this.    03/10/24: Nustep: Level 5 UE10/LE 12, x 5 mins At the stairs 2nd step hip flexor, gastroc and quadratus lumborum muscle lengthening 3 sets of 5 right/left: Rocking forward and back Rocking forward and back with arm elevation Rocking forward and back with arm reach up and over Prone glute squeezes 10x 2 sets  Prone over 2 pillows: hip extension 2 sets  Supine posterior pelvic tilts 10x Bridges 5x  Sidelying clams 10x right only Standing modified single leg RDL reach to knee level stool 10x right/left (mild symptoms produced on with right LE WB) Education on expected course and time for recovery   03/08/24: Therapeutic Exercise Nustep: Level 7 UE10/LE 12, x 6 mins, pt reports no increase in buttock pain, but some increased discomfort in Rt knee. (Had surgery 50 years ago and feels like bone on bone) Seated EOB  for bil HS stretch and piriformis stretch x 2 reps, 20 sec holds with VC's for  technique Neuromuscular Re Ed In // bars: Heel-toe front and retro walking 2 laps each, slow and controlled high knee marching x 4 laps; crossover in front x 2 each way, then crossover in back x 2 laps returning therapist demo. Then SLS on blue oval for hip 3 way raises returning therapist demo for each x 12 each and VC's during  Therapeutic Activities Bridges x 10 Posterior pelvic tilt x 10 returning demo, then tilt with clams 2 x 10, challenging for pt to hold tilt so did not progress with marching today   03/01/24 Initial eval completed and initiated HEP Educated on pain control, body mechanics, proper lifting technique and cross friction massage with LAX ball    PATIENT EDUCATION:  Education details: Educated on pain control, body mechanics, proper lifting technique and cross friction massage with LAX ball  Person educated: Patient Education method: Programmer, multimedia, Facilities manager, Verbal cues, and Handouts Education comprehension: verbalized understanding, returned demonstration, and verbal cues required  HOME EXERCISE PROGRAM: Access Code: [email or web address] URL: [email or web address] Date: 03/01/2024 Prepared by: Delon Haddock  Exercises - Standing Hamstring Stretch on Chair  - 1 x daily - 7 x weekly - 1 sets - 3 reps - 30 sec hold - Quadricep Stretch with Chair and Counter Support  - 1 x daily - 7 x weekly - 1 sets - 3 reps - 30 sec hold - Seated Figure 4 Piriformis Stretch  - 1 x daily - 7 x weekly - 1 sets - 3 reps - 30 sed hold  ASSESSMENT:  CLINICAL IMPRESSION: Condition of right knee interferes with some activity.  He is also likely compensating due to the condition of the right knee, thus contributing to his back and hip pain.  He completed all tasks today with modifications for the right knee.  He is gaining some relief of the right hip but admits this is minimal so far.  He would benefit from continuing skilled PT to address his right hip pain.    OBJECTIVE  IMPAIRMENTS: difficulty walking, decreased strength, increased fascial restrictions, increased muscle spasms, impaired flexibility, improper body mechanics, postural dysfunction, and pain.   ACTIVITY LIMITATIONS: carrying, lifting, bending, sitting, standing, squatting, sleeping, stairs, transfers, bed mobility, bathing, and dressing  PARTICIPATION LIMITATIONS: meal prep, cleaning, laundry, driving, shopping, community activity, and yard work  PERSONAL FACTORS: Age, Fitness, and 1-2 comorbidities: afib and neuropathy are also affecting patient's functional outcome.   REHAB POTENTIAL: Good  CLINICAL DECISION MAKING: Stable/uncomplicated  EVALUATION COMPLEXITY: Low   GOALS: Goals reviewed with patient? Yes  SHORT TERM GOALS: Target date: 03/29/2024  Pain report to be no greater than 4/10  Baseline: Goal status: MET 03/15/24  2.  Patient will be independent with initial HEP  Baseline:  Goal status: MET 03/15/24   Tanner TERM GOALS: Target date: 04/26/2024  Patient to report pain no greater than 2/10  Baseline:  Goal status: INITIAL  2.  Patient to be independent with advanced HEP  Baseline:  Goal status: INITIAL  3.  Patient to be able to stand or walk for at least 15 min without leg pain  Baseline:  Goal status: INITIAL  4.  Patient to report 85% improvement in overall symptoms  Baseline:  Goal status: INITIAL  5.  LEFS score to improve to at least 60/80 Baseline:  Goal status: INITIAL  6.  Patient to be able to do SLS on each LE  for at least 10 sec withouth LOB Baseline:  Goal status: INITIAL   PLAN:  PT FREQUENCY: 1-2x/week  PT DURATION: 8 weeks  PLANNED INTERVENTIONS: 97110-Therapeutic exercises, 97530- Therapeutic activity, [id number]- Neuromuscular re-education, 97535- Self Care, [id number]- Manual therapy, [phone]- Gait training, [phone]- Canalith repositioning, [id number]- Aquatic Therapy, [phone]- Electrical stimulation (unattended), [phone]- Electrical stimulation (manual),  [id number]- Vasopneumatic device, [id number]- Ultrasound, [id number]- Ionotophoresis 4mg /ml Dexamethasone, Patient/Family education, Balance training, Stair training, Taping, Joint mobilization, Spinal mobilization, Vestibular training, Visual/preceptual remediation/compensation, DME instructions, Cryotherapy, and Moist heat  PLAN FOR NEXT SESSION: Cont Nustep (although limit time), progress core strengthening, and cont 4 way hip and balance training.     Delon B. Garvis Downum, PT 03/16/24 7:41 AM [facility] [address] [place], [place] [address] Phone # [phone] Fax [phone]

## 2024-03-17 ENCOUNTER — Ambulatory Visit

## 2024-03-17 DIAGNOSIS — M6281 Muscle weakness (generalized): Secondary | ICD-10-CM

## 2024-03-17 DIAGNOSIS — R262 Difficulty in walking, not elsewhere classified: Secondary | ICD-10-CM

## 2024-03-17 DIAGNOSIS — R293 Abnormal posture: Secondary | ICD-10-CM

## 2024-03-17 DIAGNOSIS — M79604 Pain in right leg: Secondary | ICD-10-CM

## 2024-03-17 DIAGNOSIS — R252 Cramp and spasm: Secondary | ICD-10-CM

## 2024-03-17 DIAGNOSIS — R2689 Other abnormalities of gait and mobility: Secondary | ICD-10-CM | POA: Diagnosis not present

## 2024-03-17 NOTE — Therapy (Signed)
 OUTPATIENT PHYSICAL THERAPY LOWER EXTREMITY TREATMENT   Patient Name: Tanner [name] MRN: [id number] DOB:1934-11-14, 89 y.o., male Today's Date: 03/17/2024  END OF SESSION:  PT End of Session - 03/17/24 1007     Visit Number 5    Number of Visits 8    Date for PT Re-Evaluation 04/26/24    Authorization Type [employer]    Authorization Time Period [employer] approved 8 visits 03/01/2024 - 04/26/2024    Authorization - Visit Number 5    Authorization - Number of Visits 8    Progress Note Due on Visit 10    PT Start Time 1002    PT Stop Time 1058    PT Time Calculation (min) 56 min    Activity Tolerance Patient tolerated treatment well    Behavior During Therapy [facility] for tasks assessed/performed          Past Medical History:  Diagnosis Date   Allergy    seasonal- mostly spring   Anemia 02/03/2011   ASTHMA, CHILDHOOD 03/17/2010   mostly as child   BCC (basal cell carcinoma) 12/08/2011   CAROTID ARTERY OCCLUSION, WITH INFARCTION 03/17/2010   CEREBROVASCULAR ACCIDENT 03/17/2010   partial loss of peripheral vision on left-slight improved   CHICKENPOX, HX OF 03/17/2010   Constipation 12/08/2014   Epistaxis 09/06/2013   FATIGUE 03/17/2010   Hearing loss 11/04/2010   bilateral   HYPERKALEMIA 05/27/2010   Hyperlipidemia    Hypertension    Hypothyroid 12/08/2011   Mixed hyperlipidemia 03/17/2010   Oral lesion 11/20/2014   Overweight(278.02) 07/01/2010   Peripheral neuropathy 08/18/2016   Personal history of other infectious and parasitic disease 03/17/2010   Preventative health care 08/23/2016   Skin lesion of right arm 11/20/2014   Sleep apnea    uses mouthpiece only   Thrombocytopenia (HCC) 04/05/2012   TOBACCO ABUSE, HX OF 03/17/2010   Tubular adenoma of colon 06/10/2011   Past Surgical History:  Procedure Laterality Date   CARDIOVERSION N/A 08/11/2019   Procedure: CARDIOVERSION;  Surgeon: Delford Maude BROCKS, MD;  Location: [facility] ENDOSCOPY;  Service: Cardiovascular;  Laterality: N/A;    CARDIOVERSION N/A 12/05/2021   Procedure: CARDIOVERSION;  Surgeon: Delford Maude BROCKS, MD;  Location: [facility] ENDOSCOPY;  Service: Cardiovascular;  Laterality: N/A;   CAROTID ENDARTERECTOMY Left February 13, 2013   cea   CATARACT EXTRACTION, BILATERAL Right    Cataract, and stigmatism   COLONOSCOPY  Dec. 2014   ENDARTERECTOMY Left 02/13/2013   Procedure: ENDARTERECTOMY CAROTID;  Surgeon: Krystal JULIANNA Doing, MD;  Location: [facility] OR;  Service: Vascular;  Laterality: Left;   EUS N/A 08/17/2013   Procedure: UPPER ENDOSCOPIC ULTRASOUND (EUS) LINEAR;  Surgeon: Toribio SHAUNNA Cedar, MD;  Location: [facility] ENDOSCOPY;  Service: Endoscopy;  Laterality: N/A;   knee cartliage repair  1964, 1990   bilateral   PILONIDAL CYST EXCISION  1968   removed   ROTATOR CUFF REPAIR     right   TONSILLECTOMY     UPPER GI ENDOSCOPY  Jan. 2015   Patient Active Problem List   Diagnosis Date Noted   Hamstring injury, right, initial encounter 02/10/2024   Balance disorder 02/10/2024   Disorder of vitamin B12 05/27/2022   Urinary frequency 05/27/2022   Greater trochanteric bursitis, left 02/02/2022   Insomnia 11/11/2021   Loss of transverse plantar arch 12/31/2020   Low vitamin D  level 11/08/2020   Degenerative arthritis of right knee 11/08/2020   Feeling grief 08/14/2020   Hypercoagulable state due to persistent atrial fibrillation (HCC)  08/02/2019   Persistent atrial fibrillation (HCC) 06/04/2019   Educated about COVID-19 virus infection 04/02/2019   Hyperglycemia 04/05/2018   Cough 04/05/2018   Preventative health care 08/23/2016   Peripheral neuropathy 08/18/2016   Constipation 12/08/2014   Skin lesion of right arm 11/20/2014   Oral lesion 11/20/2014   Medicare annual wellness visit, subsequent 05/27/2014   Occlusion and stenosis of carotid artery without mention of cerebral infarction 09/19/2013   Plantar fasciitis of left foot 05/22/2013   Varicose veins of leg with pain 05/22/2013   OSA (obstructive sleep apnea) 12/03/2012    Thrombocytopenia (HCC) 04/05/2012   Hypothyroid 12/08/2011   BCC (basal cell carcinoma) 12/08/2011   Tubular adenoma of colon 06/10/2011   Anemia 02/03/2011   Hearing loss 11/04/2010   Overweight 07/01/2010   HYPERKALEMIA 05/27/2010   Mixed hyperlipidemia 03/17/2010   Essential hypertension, benign 03/17/2010   Mild atherosclerosis of carotid artery 03/17/2010   H/O: CVA (cerebrovascular accident) 03/17/2010   ALLERGIC RHINITIS CAUSE UNSPECIFIED 03/17/2010   ASTHMA, CHILDHOOD 03/17/2010   TOBACCO ABUSE, HX OF 03/17/2010   CHICKENPOX, HX OF 03/17/2010    PCP:    [name] LABOR, MD    REFERRING PROVIDER: Claudene Arthea HERO, DO  REFERRING DIAG: R26.89 (ICD-10-CM) - Balance disorder S76.301A (ICD-10-CM) - Hamstring injury, right, initial encoun  THERAPY DIAG:  Pain in right leg  Difficulty in walking, not elsewhere classified  Muscle weakness (generalized)  Cramp and spasm  Abnormal posture  Rationale for Evaluation and Treatment: Rehabilitation  ONSET DATE: 02/10/2024  SUBJECTIVE:   SUBJECTIVE STATEMENT: My Rt LE pain is okay today.   PERTINENT HISTORY: na PAIN:  Are you having pain? Yes: NPRS scale: 0/10 Pain location: right hip and buttock Pain description: aching, intense at times Aggravating factors: sitting on right hip and walking uphill Relieving factors: rest, lying flat  PRECAUTIONS: Fall  RED FLAGS: None   WEIGHT BEARING RESTRICTIONS: No  FALLS:  Has patient fallen in last 6 months? No  LIVING ENVIRONMENT: Lives with: lives alone Lives in: House/apartment Stairs: Yes: Internal: 12 steps; on right going up and External: 4 steps; on right going up Has following equipment at home: None  OCCUPATION: retired  PLOF: Independent, Independent with basic ADLs, Independent with household mobility without device, Independent with community mobility without device, Independent with homemaking with ambulation, Independent with gait, and Independent  with transfers  PATIENT GOALS: to get rid of the pain  NEXT MD VISIT: prn  OBJECTIVE:  Note: Objective measures were completed at Evaluation unless otherwise noted.  DIAGNOSTIC FINDINGS: na  PATIENT SURVEYS:  LEFS (completed on ipad) Extreme difficulty/unable (0), Quite a bit of difficulty (1), Moderate difficulty (2), Little difficulty (3), No difficulty (4) Survey date:  03/01/24  Any of your usual work, housework or school activities   2. Usual hobbies, recreational or sporting activities   3. Getting into/out of the bath   4. Walking between rooms   5. Putting on socks/shoes   6. Squatting    7. Lifting an object, like a bag of groceries from the floor   8. Performing light activities around your home   9. Performing heavy activities around your home   10. Getting into/out of a car   11. Walking 2 blocks   12. Walking 1 mile   13. Going up/down 10 stairs (1 flight)   14. Standing for 1 hour   15.  sitting for 1 hour   16. Running on even ground   17.  Running on uneven ground   18. Making sharp turns while running fast   19. Hopping    20. Rolling over in bed   Score total:  54/80     COGNITION: Overall cognitive status: Within functional limits for tasks assessed     SENSATION: WFL  MUSCLE LENGTH: Hamstrings: Right 70 deg; Left 70 deg Thomas test: Right pos; Left pos  POSTURE: rounded shoulders, forward head, decreased lumbar lordosis, and posterior pelvic tilt  PALPATION: na  LOWER EXTREMITY ROM:  WFL  LOWER EXTREMITY MMT:  Generally 4+ to 5/5  LOWER EXTREMITY SPECIAL TESTS:  Hip special tests: Belvie (FABER) test: negative, Thomas test: positive , Ober's test: positive , and Piriformis test: positive   FUNCTIONAL TESTS:  5 times sit to stand: 10.70 sec Timed up and go (TUG): 9.88 sec  GAIT: Distance walked: 30 feet  Assistive device utilized: None Level of assistance: Complete Independence Comments: Occasional misstep, possible  neuropathy                                                                                                                               TREATMENT DATE:  03/17/24: Therapeutic Exercise NuStep: Level 5 UE10/LE 12, x 5 mins Seated piriformis stretch 2 x 30 sec Seated EOB for bil HS stretch x 2 reps, 30 sec Standing quad/hip flexor stretch 2 x 45 sec Standing HS stretch with heel on Power Plate x 30 sec each Therapeutic Activities Neuro Muscular Re Ed  Step ups 6 2 x 10 bil Bil hip 3 way raises with SLS on blue oval and 2# added to each ankle in // bars and encouraging decreasing UE support as able and for core engaged throughout for increased stability Slow and controlled high knee marching 4 laps in bars Resisted walking 20# Rt sidestepping, 10# front and Lt sidestepping 3-5 reps each with CGA throughout for mild LOB until he adjusted to exs Supine bridges x 15 Supine posterior pelvic tilt x 15 focusing on proper technique which pt struggles with due to pelvic and TA weakness. Then continued tilts with purple ball squeeze x 10 and trial of alt march x 5 each requiring VC's during for tilt  03/15/24: Nustep: Level 5 UE10/LE 12, x 5 mins Standing hamstring stretch at steps 2 x 30 sec Standing quad/hip flexor stretch 2 x 30 sec Seated piriformis stretch 2 x 30 sec Stair training/observation: patient able to do reciprocal steps up and down with ease (he had mentioned that he gets right leg pain if he doesn't get his whole foot on step but he says this just occasionally happens.  Suggested just being more conscious  Step up 6 2 x 10 Lateral heel tap 2 x 10 4 Squats to mat table 2 x 10 Discussed the condition of the right knee and likely need for TKA but patient states he is adamant that he doesn't want to do the TKA.  We discussed how the  pain inhibits the ability for the quad to build and that he will likely meet a plateau with therapy.  He returns understanding of this.     03/10/24: Nustep: Level 5 UE10/LE 12, x 5 mins At the stairs 2nd step hip flexor, gastroc and quadratus lumborum muscle lengthening 3 sets of 5 right/left: Rocking forward and back Rocking forward and back with arm elevation Rocking forward and back with arm reach up and over Prone glute squeezes 10x 2 sets  Prone over 2 pillows: hip extension 2 sets  Supine posterior pelvic tilts 10x Bridges 5x  Sidelying clams 10x right only Standing modified single leg RDL reach to knee level stool 10x right/left (mild symptoms produced on with right LE WB) Education on expected course and time for recovery   03/08/24: Therapeutic Exercise Nustep: Level 7 UE10/LE 12, x 6 mins, pt reports no increase in buttock pain, but some increased discomfort in Rt knee. (Had surgery 50 years ago and feels like bone on bone) Seated EOB for bil HS stretch and piriformis stretch x 2 reps, 20 sec holds with VC's for technique Neuromuscular Re Ed In // bars: Heel-toe front and retro walking 2 laps each, slow and controlled high knee marching x 4 laps; crossover in front x 2 each way, then crossover in back x 2 laps returning therapist demo. Then SLS on blue oval for hip 3 way raises returning therapist demo for each x 12 each and VC's during  Therapeutic Activities Bridges x 10 Posterior pelvic tilt x 10 returning demo, then tilt with clams 2 x 10, challenging for pt to hold tilt so did not progress with marching today   03/01/24 Initial eval completed and initiated HEP Educated on pain control, body mechanics, proper lifting technique and cross friction massage with LAX ball    PATIENT EDUCATION:  Education details: Educated on pain control, body mechanics, proper lifting technique and cross friction massage with LAX ball  Person educated: Patient Education method: Programmer, multimedia, Facilities manager, Verbal cues, and Handouts Education comprehension: verbalized understanding, returned demonstration, and verbal cues  required  HOME EXERCISE PROGRAM: Access Code: [email or web address] URL: [email or web address] Date: 03/01/2024 Prepared by: Delon Haddock  Exercises - Standing Hamstring Stretch on Chair  - 1 x daily - 7 x weekly - 1 sets - 3 reps - 30 sec hold - Quadricep Stretch with Chair and Counter Support  - 1 x daily - 7 x weekly - 1 sets - 3 reps - 30 sec hold - Seated Figure 4 Piriformis Stretch  - 1 x daily - 7 x weekly - 1 sets - 3 reps - 30 sed hold  ASSESSMENT:  CLINICAL IMPRESSION: Pts Rt knee was not bothering him today so was able to progress balance activities to include resisted walking with Free Motion machine but with CGA throughout by PTA. Also added ankle weights with 3 way hip raises which pt was challenged by.   OBJECTIVE IMPAIRMENTS: difficulty walking, decreased strength, increased fascial restrictions, increased muscle spasms, impaired flexibility, improper body mechanics, postural dysfunction, and pain.   ACTIVITY LIMITATIONS: carrying, lifting, bending, sitting, standing, squatting, sleeping, stairs, transfers, bed mobility, bathing, and dressing  PARTICIPATION LIMITATIONS: meal prep, cleaning, laundry, driving, shopping, community activity, and yard work  PERSONAL FACTORS: Age, Fitness, and 1-2 comorbidities: afib and neuropathy are also affecting patient's functional outcome.   REHAB POTENTIAL: Good  CLINICAL DECISION MAKING: Stable/uncomplicated  EVALUATION COMPLEXITY: Low   GOALS: Goals reviewed with patient? Yes  SHORT TERM GOALS: Target date: 03/29/2024  Pain report to be no greater than 4/10  Baseline: Goal status: MET 03/15/24  2.  Patient will be independent with initial HEP  Baseline:  Goal status: MET 03/15/24   LONG TERM GOALS: Target date: 04/26/2024  Patient to report pain no greater than 2/10  Baseline:  Goal status: INITIAL  2.  Patient to be independent with advanced HEP  Baseline:  Goal status: INITIAL  3.  Patient to be able to  stand or walk for at least 15 min without leg pain  Baseline:  Goal status: INITIAL  4.  Patient to report 85% improvement in overall symptoms  Baseline:  Goal status: INITIAL  5.  LEFS score to improve to at least 60/80 Baseline:  Goal status: INITIAL  6.  Patient to be able to do SLS on each LE for at least 10 sec withouth LOB Baseline:  Goal status: INITIAL   PLAN:  PT FREQUENCY: 1-2x/week  PT DURATION: 8 weeks  PLANNED INTERVENTIONS: 97110-Therapeutic exercises, 97530- Therapeutic activity, [id number]- Neuromuscular re-education, 97535- Self Care, [id number]- Manual therapy, [id number]- Gait training, [phone]- Canalith repositioning, [id number]- Aquatic Therapy, [phone]- Electrical stimulation (unattended), [phone]- Electrical stimulation (manual), [id number]- Vasopneumatic device, [id number]- Ultrasound, [id number]- Ionotophoresis 4mg /ml Dexamethasone, Patient/Family education, Balance training, Stair training, Taping, Joint mobilization, Spinal mobilization, Vestibular training, Visual/preceptual remediation/compensation, DME instructions, Cryotherapy, and Moist heat  PLAN FOR NEXT SESSION: Cont Nustep (although limit time), progress core strengthening, and cont 4 way hip and balance training, and cont resisted walking?   Berwyn Knights, PTA 03/17/24 11:07 AM [facility] [address] [place], [place] [address] Phone # [phone] Fax [phone]

## 2024-03-22 ENCOUNTER — Ambulatory Visit: Attending: Family Medicine

## 2024-03-22 DIAGNOSIS — M79604 Pain in right leg: Secondary | ICD-10-CM | POA: Insufficient documentation

## 2024-03-22 DIAGNOSIS — M6281 Muscle weakness (generalized): Secondary | ICD-10-CM | POA: Diagnosis present

## 2024-03-22 DIAGNOSIS — R252 Cramp and spasm: Secondary | ICD-10-CM | POA: Diagnosis present

## 2024-03-22 DIAGNOSIS — R293 Abnormal posture: Secondary | ICD-10-CM | POA: Insufficient documentation

## 2024-03-22 DIAGNOSIS — R262 Difficulty in walking, not elsewhere classified: Secondary | ICD-10-CM | POA: Diagnosis present

## 2024-03-22 NOTE — Therapy (Signed)
 OUTPATIENT PHYSICAL THERAPY LOWER EXTREMITY TREATMENT   Patient Name: Tanner [name] MRN: [id number] DOB:Apr 21, 1935, 89 y.o., male Today's Date: 03/22/2024  END OF SESSION:  PT End of Session - 03/22/24 1008     Visit Number 6    Number of Visits 8    Date for PT Re-Evaluation 04/26/24    Authorization Type [employer]    Authorization Time Period [employer] approved 8 visits 03/01/2024 - 04/26/2024    Authorization - Visit Number 6    Authorization - Number of Visits 8    Progress Note Due on Visit 10    PT Start Time 1005    PT Stop Time 1103    PT Time Calculation (min) 58 min    Activity Tolerance Patient tolerated treatment well    Behavior During Therapy [facility] for tasks assessed/performed          Past Medical History:  Diagnosis Date   Allergy    seasonal- mostly spring   Anemia 02/03/2011   ASTHMA, CHILDHOOD 03/17/2010   mostly as child   BCC (basal cell carcinoma) 12/08/2011   CAROTID ARTERY OCCLUSION, WITH INFARCTION 03/17/2010   CEREBROVASCULAR ACCIDENT 03/17/2010   partial loss of peripheral vision on left-slight improved   CHICKENPOX, HX OF 03/17/2010   Constipation 12/08/2014   Epistaxis 09/06/2013   FATIGUE 03/17/2010   Hearing loss 11/04/2010   bilateral   HYPERKALEMIA 05/27/2010   Hyperlipidemia    Hypertension    Hypothyroid 12/08/2011   Mixed hyperlipidemia 03/17/2010   Oral lesion 11/20/2014   Overweight(278.02) 07/01/2010   Peripheral neuropathy 08/18/2016   Personal history of other infectious and parasitic disease 03/17/2010   Preventative health care 08/23/2016   Skin lesion of right arm 11/20/2014   Sleep apnea    uses mouthpiece only   Thrombocytopenia (HCC) 04/05/2012   TOBACCO ABUSE, HX OF 03/17/2010   Tubular adenoma of colon 06/10/2011   Past Surgical History:  Procedure Laterality Date   CARDIOVERSION N/A 08/11/2019   Procedure: CARDIOVERSION;  Surgeon: Delford Maude BROCKS, MD;  Location: [facility] ENDOSCOPY;  Service: Cardiovascular;  Laterality: N/A;    CARDIOVERSION N/A 12/05/2021   Procedure: CARDIOVERSION;  Surgeon: Delford Maude BROCKS, MD;  Location: [facility] ENDOSCOPY;  Service: Cardiovascular;  Laterality: N/A;   CAROTID ENDARTERECTOMY Left February 13, 2013   cea   CATARACT EXTRACTION, BILATERAL Right    Cataract, and stigmatism   COLONOSCOPY  Dec. 2014   ENDARTERECTOMY Left 02/13/2013   Procedure: ENDARTERECTOMY CAROTID;  Surgeon: Krystal JULIANNA Doing, MD;  Location: [facility] OR;  Service: Vascular;  Laterality: Left;   EUS N/A 08/17/2013   Procedure: UPPER ENDOSCOPIC ULTRASOUND (EUS) LINEAR;  Surgeon: Toribio SHAUNNA Cedar, MD;  Location: [facility] ENDOSCOPY;  Service: Endoscopy;  Laterality: N/A;   knee cartliage repair  1964, 1990   bilateral   PILONIDAL CYST EXCISION  1968   removed   ROTATOR CUFF REPAIR     right   TONSILLECTOMY     UPPER GI ENDOSCOPY  Jan. 2015   Patient Active Problem List   Diagnosis Date Noted   Hamstring injury, right, initial encounter 02/10/2024   Balance disorder 02/10/2024   Disorder of vitamin B12 05/27/2022   Urinary frequency 05/27/2022   Greater trochanteric bursitis, left 02/02/2022   Insomnia 11/11/2021   Loss of transverse plantar arch 12/31/2020   Low vitamin D  level 11/08/2020   Degenerative arthritis of right knee 11/08/2020   Feeling grief 08/14/2020   Hypercoagulable state due to persistent atrial fibrillation (HCC)  08/02/2019   Persistent atrial fibrillation (HCC) 06/04/2019   Educated about COVID-19 virus infection 04/02/2019   Hyperglycemia 04/05/2018   Cough 04/05/2018   Preventative health care 08/23/2016   Peripheral neuropathy 08/18/2016   Constipation 12/08/2014   Skin lesion of right arm 11/20/2014   Oral lesion 11/20/2014   Medicare annual wellness visit, subsequent 05/27/2014   Occlusion and stenosis of carotid artery without mention of cerebral infarction 09/19/2013   Plantar fasciitis of left foot 05/22/2013   Varicose veins of leg with pain 05/22/2013   OSA (obstructive sleep apnea) 12/03/2012    Thrombocytopenia (HCC) 04/05/2012   Hypothyroid 12/08/2011   BCC (basal cell carcinoma) 12/08/2011   Tubular adenoma of colon 06/10/2011   Anemia 02/03/2011   Hearing loss 11/04/2010   Overweight 07/01/2010   HYPERKALEMIA 05/27/2010   Mixed hyperlipidemia 03/17/2010   Essential hypertension, benign 03/17/2010   Mild atherosclerosis of carotid artery 03/17/2010   H/O: CVA (cerebrovascular accident) 03/17/2010   ALLERGIC RHINITIS CAUSE UNSPECIFIED 03/17/2010   ASTHMA, CHILDHOOD 03/17/2010   TOBACCO ABUSE, HX OF 03/17/2010   CHICKENPOX, HX OF 03/17/2010    PCP:    [name] LABOR, MD    REFERRING PROVIDER: Claudene Arthea HERO, DO  REFERRING DIAG: R26.89 (ICD-10-CM) - Balance disorder S76.301A (ICD-10-CM) - Hamstring injury, right, initial encoun  THERAPY DIAG:  Pain in right leg  Difficulty in walking, not elsewhere classified  Muscle weakness (generalized)  Cramp and spasm  Abnormal posture  Rationale for Evaluation and Treatment: Rehabilitation  ONSET DATE: 02/10/2024  SUBJECTIVE:   SUBJECTIVE STATEMENT: I've been feeling great. My Rt buttock is what bothers me the most now, the leg is a lot better. After about 5 mins the buttock starts feeling really tender, like I'm sitting on a bruise. My Rt leg is feeling stronger in general now as well. Going up stairs has even gotten easier.   PERTINENT HISTORY: na PAIN:  Not currently  PRECAUTIONS: Fall  RED FLAGS: None   WEIGHT BEARING RESTRICTIONS: No  FALLS:  Has patient fallen in last 6 months? No  LIVING ENVIRONMENT: Lives with: lives alone Lives in: House/apartment Stairs: Yes: Internal: 12 steps; on right going up and External: 4 steps; on right going up Has following equipment at home: None  OCCUPATION: retired  PLOF: Independent, Independent with basic ADLs, Independent with household mobility without device, Independent with community mobility without device, Independent with homemaking with  ambulation, Independent with gait, and Independent with transfers  PATIENT GOALS: to get rid of the pain  NEXT MD VISIT: prn  OBJECTIVE:  Note: Objective measures were completed at Evaluation unless otherwise noted.  DIAGNOSTIC FINDINGS: na  PATIENT SURVEYS:  LEFS (completed on ipad) Extreme difficulty/unable (0), Quite a bit of difficulty (1), Moderate difficulty (2), Little difficulty (3), No difficulty (4) Survey date:  03/01/24  Any of your usual work, housework or school activities   2. Usual hobbies, recreational or sporting activities   3. Getting into/out of the bath   4. Walking between rooms   5. Putting on socks/shoes   6. Squatting    7. Lifting an object, like a bag of groceries from the floor   8. Performing light activities around your home   9. Performing heavy activities around your home   10. Getting into/out of a car   11. Walking 2 blocks   12. Walking 1 mile   13. Going up/down 10 stairs (1 flight)   14. Standing for 1 hour   15.  sitting for 1 hour   16. Running on even ground   17. Running on uneven ground   18. Making sharp turns while running fast   19. Hopping    20. Rolling over in bed   Score total:  54/80     COGNITION: Overall cognitive status: Within functional limits for tasks assessed     SENSATION: WFL  MUSCLE LENGTH: Hamstrings: Right 70 deg; Left 70 deg Thomas test: Right pos; Left pos  POSTURE: rounded shoulders, forward head, decreased lumbar lordosis, and posterior pelvic tilt  PALPATION: na  LOWER EXTREMITY ROM:  WFL  LOWER EXTREMITY MMT:  Generally 4+ to 5/5  LOWER EXTREMITY SPECIAL TESTS:  Hip special tests: Belvie (FABER) test: negative, Thomas test: positive , Ober's test: positive , and Piriformis test: positive   FUNCTIONAL TESTS:  5 times sit to stand: 10.70 sec Timed up and go (TUG): 9.88 sec  GAIT: Distance walked: 30 feet  Assistive device utilized: None Level of assistance: Complete  Independence Comments: Occasional misstep, possible neuropathy                                                                                                                               TREATMENT DATE:  03/22/24: Therapeutic Exercise NuStep: Level 5 UE10/LE 12, x 5 mins Hip Machine for following: 30# hip 3 way into flex, abd and ext x 12 each returning therapist demo for each and VC's for correct LE positioning Seated piriformis stretch 2 x 30 sec (all done at end of session today) Seated EOB for bil HS stretch x 2 reps, 30 sec Standing quad/hip flexor stretch 2 x 20 sec with foot in chair behind him with UE support at // bars Standing HS stretch with heel on Power Plate x 30 sec each Therapeutic Activities  Step ups 6  x 10 bil, started having Rt knee discomfort today at end of reps so only one set today Supine posterior pelvic tilt x 15 focusing on proper technique, some improvement with technique noted today after tactile cuing initially to remind of full motion. Then continued tilts with clams,  purple ball squeeze then, alt march 3 x 5 each requiring VC's during for tilt but again, with improved technique Supine bridges x 15, VC's initially to remind of full hip ext, then able to return correct demo Neuro Muscular Re Ed  Resisted walking 20# Rt sidestepping, 13# front, 10# Lt sidestepping and front, 5 reps each with SBA-CGA throughout for safety  03/17/24: Therapeutic Exercise NuStep: Level 5 UE10/LE 12, x 5 mins Seated piriformis stretch 2 x 30 sec Seated EOB for bil HS stretch x 2 reps, 30 sec Standing quad/hip flexor stretch 2 x 45 sec Standing HS stretch with heel on Power Plate x 30 sec each Therapeutic Activities Neuro Muscular Re Ed  Step ups 6 2 x 10 bil Bil hip 3 way raises with SLS on blue oval and 2# added to each  ankle in // bars and encouraging decreasing UE support as able and for core engaged throughout for increased stability Slow and controlled high knee marching  4 laps in bars Resisted walking 20# Rt sidestepping, 10# front and Lt sidestepping 3-5 reps each with CGA throughout for mild LOB until he adjusted to exs Supine bridges x 15 Supine posterior pelvic tilt x 15 focusing on proper technique which pt struggles with due to pelvic and TA weakness. Then continued tilts with purple ball squeeze x 10 and trial of alt march x 5 each requiring VC's during for tilt  03/15/24: Nustep: Level 5 UE10/LE 12, x 5 mins Standing hamstring stretch at steps 2 x 30 sec Standing quad/hip flexor stretch 2 x 30 sec Seated piriformis stretch 2 x 30 sec Stair training/observation: patient able to do reciprocal steps up and down with ease (he had mentioned that he gets right leg pain if he doesn't get his whole foot on step but he says this just occasionally happens.  Suggested just being more conscious  Step up 6 2 x 10 Lateral heel tap 2 x 10 4 Squats to mat table 2 x 10 Discussed the condition of the right knee and likely need for TKA but patient states he is adamant that he doesn't want to do the TKA.  We discussed how the pain inhibits the ability for the quad to build and that he will likely meet a plateau with therapy.  He returns understanding of this.    03/10/24: Nustep: Level 5 UE10/LE 12, x 5 mins At the stairs 2nd step hip flexor, gastroc and quadratus lumborum muscle lengthening 3 sets of 5 right/left: Rocking forward and back Rocking forward and back with arm elevation Rocking forward and back with arm reach up and over Prone glute squeezes 10x 2 sets  Prone over 2 pillows: hip extension 2 sets  Supine posterior pelvic tilts 10x Bridges 5x  Sidelying clams 10x right only Standing modified single leg RDL reach to knee level stool 10x right/left (mild symptoms produced on with right LE WB) Education on expected course and time for recovery      PATIENT EDUCATION:  Education details: Educated on pain control, body mechanics, proper lifting  technique and cross friction massage with LAX ball  Person educated: Patient Education method: Programmer, multimedia, Facilities manager, Verbal cues, and Handouts Education comprehension: verbalized understanding, returned demonstration, and verbal cues required  HOME EXERCISE PROGRAM: Access Code: [email or web address] URL: [email or web address] Date: 03/01/2024 Prepared by: Delon Haddock  Exercises - Standing Hamstring Stretch on Chair  - 1 x daily - 7 x weekly - 1 sets - 3 reps - 30 sec hold - Quadricep Stretch with Chair and Counter Support  - 1 x daily - 7 x weekly - 1 sets - 3 reps - 30 sec hold - Seated Figure 4 Piriformis Stretch  - 1 x daily - 7 x weekly - 1 sets - 3 reps - 30 sed hold  ASSESSMENT:  CLINICAL IMPRESSION: Pt is repotring good improvements overall since starting physical therapy. He feels his Rt leg is stronger going up stairs and now pain has decreased to only being felt near his glut fold with sitting when it was radiating further down his leg. Today continued with focus on Rt LE strength and stability. Pt is feeling challenged by activities and feels therapy has been beneficial thus far in helping him return to his ADLs with less pain.   OBJECTIVE IMPAIRMENTS: difficulty walking, decreased strength, increased fascial restrictions, increased  muscle spasms, impaired flexibility, improper body mechanics, postural dysfunction, and pain.   ACTIVITY LIMITATIONS: carrying, lifting, bending, sitting, standing, squatting, sleeping, stairs, transfers, bed mobility, bathing, and dressing  PARTICIPATION LIMITATIONS: meal prep, cleaning, laundry, driving, shopping, community activity, and yard work  PERSONAL FACTORS: Age, Fitness, and 1-2 comorbidities: afib and neuropathy are also affecting patient's functional outcome.   REHAB POTENTIAL: Good  CLINICAL DECISION MAKING: Stable/uncomplicated  EVALUATION COMPLEXITY: Low   GOALS: Goals reviewed with patient? Yes  SHORT TERM  GOALS: Target date: 03/29/2024  Pain report to be no greater than 4/10  Baseline: Goal status: MET 03/15/24  2.  Patient will be independent with initial HEP  Baseline:  Goal status: MET 03/15/24   LONG TERM GOALS: Target date: 04/26/2024  Patient to report pain no greater than 2/10  Baseline:  Goal status: INITIAL  2.  Patient to be independent with advanced HEP  Baseline:  Goal status: INITIAL  3.  Patient to be able to stand or walk for at least 15 min without leg pain  Baseline:  Goal status: INITIAL  4.  Patient to report 85% improvement in overall symptoms  Baseline:  Goal status: INITIAL  5.  LEFS score to improve to at least 60/80 Baseline:  Goal status: INITIAL  6.  Patient to be able to do SLS on each LE for at least 10 sec withouth LOB Baseline:  Goal status: INITIAL   PLAN:  PT FREQUENCY: 1-2x/week  PT DURATION: 8 weeks  PLANNED INTERVENTIONS: 97110-Therapeutic exercises, 97530- Therapeutic activity, 97112- Neuromuscular re-education, 97535- Self Care, [id number]- Manual therapy, [phone]- Gait training, [phone]- Canalith repositioning, [id number]- Aquatic Therapy, [phone]- Electrical stimulation (unattended), [phone]- Electrical stimulation (manual), [id number]- Vasopneumatic device, [id number]- Ultrasound, [id number]- Ionotophoresis 4mg /ml Dexamethasone, Patient/Family education, Balance training, Stair training, Taping, Joint mobilization, Spinal mobilization, Vestibular training, Visual/preceptual remediation/compensation, DME instructions, Cryotherapy, and Moist heat  PLAN FOR NEXT SESSION: Cont Nustep (although limit time), progress core strengthening, and cont 4 way hip and balance training, and cont resisted walking   Berwyn Knights, PTA 03/22/24 12:15 PM [facility] [address], [address] [place], [place] [address] Phone # [phone] Fax [phone]

## 2024-03-24 ENCOUNTER — Ambulatory Visit

## 2024-03-24 DIAGNOSIS — M79604 Pain in right leg: Secondary | ICD-10-CM | POA: Diagnosis not present

## 2024-03-24 DIAGNOSIS — M6281 Muscle weakness (generalized): Secondary | ICD-10-CM

## 2024-03-24 DIAGNOSIS — R293 Abnormal posture: Secondary | ICD-10-CM

## 2024-03-24 DIAGNOSIS — R262 Difficulty in walking, not elsewhere classified: Secondary | ICD-10-CM

## 2024-03-24 DIAGNOSIS — R252 Cramp and spasm: Secondary | ICD-10-CM

## 2024-03-24 NOTE — Progress Notes (Unsigned)
 [name] [address] [place] [address] Phone: [phone] Subjective:   LILLETTE Tanner [name] am a scribe for Dr. Claudene.   I'm seeing this patient by the request  of:  Tanner [name] LABOR, MD  CC: Right hamstring pain  YEP:Dlagzrupcz  02/10/2024 Referred to physical therapy     Encourage supplementation and may need to recheck.     Patient does have a hamstring injury noted.  Discussed icing regimen of home exercises, discussed which activities to do and which ones to avoid.  I do feel though that thigh compression could be helpful.  Differential includes more of a lumbar radiculopathy.  Will monitor.  Does also have some greater trochanteric tenderness.  Has some difficulty with balance so I do feel formal physical therapy would be beneficial.  Discussed icing regimen of home exercises otherwise.  Follow-up again in 6 to 8 weeks.     Update 03/27/2024 Tanner [name] is a 89 y.o. male coming in with complaint of R hamstring pain.  Patient was last seen seem to have more of a hamstring injury but also a balance disorder as well as vitamin B-12 deficiency.  Started supplementation and physical therapy.  Patient states the pain is still there.     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 Past Medical History:  Diagnosis Date   Allergy    seasonal- mostly spring   Anemia 02/03/2011   ASTHMA, CHILDHOOD 03/17/2010   mostly as child   BCC (basal cell carcinoma) 12/08/2011   CAROTID ARTERY OCCLUSION, WITH INFARCTION 03/17/2010   CEREBROVASCULAR ACCIDENT 03/17/2010   partial loss of peripheral vision on left-slight improved   CHICKENPOX, HX OF 03/17/2010   Constipation 12/08/2014   Epistaxis 09/06/2013   FATIGUE 03/17/2010   Hearing loss 11/04/2010   bilateral   HYPERKALEMIA 05/27/2010   Hyperlipidemia    Hypertension     Hypothyroid 12/08/2011   Mixed hyperlipidemia 03/17/2010   Oral lesion 11/20/2014   Overweight(278.02) 07/01/2010   Peripheral neuropathy 08/18/2016   Personal history of other infectious and parasitic disease 03/17/2010   Preventative health care 08/23/2016   Skin lesion of right arm 11/20/2014   Sleep apnea    uses mouthpiece only   Thrombocytopenia (HCC) 04/05/2012   TOBACCO ABUSE, HX OF 03/17/2010   Tubular adenoma of colon 06/10/2011   Past Surgical History:  Procedure Laterality Date   CARDIOVERSION N/A 08/11/2019   Procedure: CARDIOVERSION;  Surgeon: Tanner Maude BROCKS, MD;  Location: [facility] ENDOSCOPY;  Service: Cardiovascular;  Laterality: N/A;   CARDIOVERSION N/A 12/05/2021   Procedure: CARDIOVERSION;  Surgeon: Tanner Maude BROCKS, MD;  Location: [facility] ENDOSCOPY;  Service: Cardiovascular;  Laterality: N/A;   CAROTID ENDARTERECTOMY Left February 13, 2013   cea   CATARACT EXTRACTION, BILATERAL Right    Cataract, and stigmatism   COLONOSCOPY  Dec. 2014   ENDARTERECTOMY Left 02/13/2013   Procedure: ENDARTERECTOMY CAROTID;  Surgeon: Tanner JULIANNA Doing, MD;  Location: [facility] OR;  Service: Vascular;  Laterality: Left;   EUS N/A 08/17/2013   Procedure: UPPER ENDOSCOPIC ULTRASOUND (EUS) LINEAR;  Surgeon: Tanner SHAUNNA Cedar, MD;  Location: [facility] ENDOSCOPY;  Service: Endoscopy;  Laterality: N/A;   knee cartliage repair  1964, 1990   bilateral   PILONIDAL CYST EXCISION  1968   removed   ROTATOR CUFF REPAIR     right  TONSILLECTOMY     UPPER GI ENDOSCOPY  Jan. 2015   Social History   Socioeconomic History   Marital status: Divorced    Spouse name: Not on file   Number of children: 3   Years of education: 58yrs   Highest education level: Bachelor's degree (e.g., BA, AB, BS)  Occupational History   Occupation: retired    Associate Professor: RETIRED  Tobacco Use   Smoking status: Former    Current packs/day: 0.00    Average packs/day: 2.0 packs/day for 20.0 years (40.0 ttl pk-yrs)    Types: Cigarettes    Start date: 07/19/1957     Quit date: 07/19/1977    Years since quitting: 46.7    Passive exposure: Never   Smokeless tobacco: Never   Tobacco comments:    Former smoker 12/12/21  Vaping Use   Vaping status: Never Used  Substance and Sexual Activity   Alcohol use: Yes    Alcohol/week: 10.0 standard drinks of alcohol    Types: 7 Glasses of wine, 2 Cans of beer, 1 Shots of liquor per week   Drug use: No   Sexual activity: Not Currently  Other Topics Concern   Not on file  Social History Narrative   Pt lives at home alone.    Caffeine Use: very little   Heart healthy diet, is planning exercise      Retired from Patent examiner   Left handed   Social Drivers of Health   Financial Resource Strain: Low Risk  (09/23/2023)   Overall Financial Resource Strain (CARDIA)    Difficulty of Paying Living Expenses: Not hard at all  Food Insecurity: No Food Insecurity (09/23/2023)   Hunger Vital Sign    Worried About Running Out of Food in the Last Year: Never true    Ran Out of Food in the Last Year: Never true  Transportation Needs: No Transportation Needs (09/23/2023)   PRAPARE - Administrator, Civil Service (Medical): No    Lack of Transportation (Non-Medical): No  Physical Activity: Insufficiently Active (09/23/2023)   Exercise Vital Sign    Days of Exercise per Week: 4 days    Minutes of Exercise per Session: 30 min  Stress: No Stress Concern Present (09/23/2023)   [employer] of Occupational Health - Occupational Stress Questionnaire    Feeling of Stress : Not at all  Social Connections: Moderately Integrated (09/23/2023)   Social Connection and Isolation Panel    Frequency of Communication with Friends and Family: More than three times a week    Frequency of Social Gatherings with Friends and Family: More than three times a week    Attends Religious Services: More than 4 times per year    Active Member of [employer] or Organizations: Yes    Attends Engineer, structural: More than 4 times  per year    Marital Status: Divorced   No Known Allergies Family History  Problem Relation Age of Onset   Alzheimer's disease Mother    Emphysema Sister        cigarettes   Cancer Sister        lung, tobacco    Heart disease Son    Coronary artery disease Brother    Heart attack Brother    Other Brother        heart problems- from fathers side   Cancer Brother 61       lung? smoker   Heart disease Brother 44  Heart disease before age 55   Cancer Sister        gyn   Heart disease Sister    Bipolar disorder Daughter    Obesity Daughter    Stroke Father    Heart disease Sister    Esophageal cancer Neg Hx    Colon cancer Neg Hx    Pancreatic cancer Neg Hx    Stomach cancer Neg Hx     Current Outpatient Medications (Endocrine & Metabolic):    levothyroxine  (SYNTHROID ) 88 MCG tablet, TAKE 1 TABLET BY MOUTH DAILY  BEFORE BREAKFAST  Current Outpatient Medications (Cardiovascular):    chlorthalidone  (HYGROTON ) 25 MG tablet, TAKE 1 TABLET BY MOUTH IN THE  MORNING   metoprolol  succinate (TOPROL -XL) 25 MG 24 hr tablet, Take 1/2 tablet by mouth daily. May take extra 1/2 tablet daily for breakthrough afib   ramipril  (ALTACE ) 10 MG capsule, TAKE 1 CAPSULE BY MOUTH TWICE  DAILY   rosuvastatin  (CRESTOR ) 40 MG tablet, TAKE 1 TABLET BY MOUTH IN THE  EVENING   Current Outpatient Medications (Analgesics):    acetaminophen  (TYLENOL ) 500 MG tablet, Take 1,000 mg by mouth as needed for moderate pain or headache.    aspirin  EC 81 MG tablet, Take 81 mg by mouth every evening.   Current Outpatient Medications (Hematological):    vitamin B-12 (CYANOCOBALAMIN ) 1000 MCG tablet, Take 2,000 mcg by mouth every Monday, Wednesday, and Friday.   XARELTO  20 MG TABS tablet, TAKE 1 TABLET BY MOUTH DAILY  WITH SUPPER  Current Outpatient Medications (Other):    Cholecalciferol (VITAMIN D3) 50 MCG (2000 UT) TABS, Take 2,000 Units by mouth daily.   Multiple Vitamins-Minerals (MULTIVITAMIN WITH  MINERALS) tablet, Take 1 tablet by mouth daily. Mega men 50+   pantoprazole  (PROTONIX ) 40 MG tablet, TAKE 1 TABLET BY MOUTH DAILY   Polyethyl Glycol-Propyl Glycol (SYSTANE ULTRA OP), Place 1 drop into both eyes in the morning.   TART CHERRY PO, Take 300 mg by mouth daily.   Tenapanor HCl (IBSRELA ) 50 MG TABS, Take 50 mg by mouth 2 (two) times daily.   Reviewed prior external information including notes and imaging from  primary care provider As well as notes that were available from care everywhere and other healthcare systems.  Past medical history, social, surgical and family history all reviewed in electronic medical record.  No pertanent information unless stated regarding to the chief complaint.   Review of Systems:  No headache, visual changes, nausea, vomiting, diarrhea, constipation, dizziness, abdominal pain, skin rash, fevers, chills, night sweats, weight loss, swollen lymph nodes, body aches, joint swelling, chest pain, shortness of breath, mood changes. POSITIVE muscle aches  Objective  There were no vitals taken for this visit.   General: No apparent distress alert and oriented x3 mood and affect normal, dressed appropriately.  HEENT: Pupils equal, extraocular movements intact  Respiratory: Patient's speak in full sentences and does not appear short of breath  Cardiovascular: No lower extremity edema, non tender, no erythema  Low back does have some loss lordosis.  Patient is still severely tender over the ischial bursa.  Tightness noted with straight leg test.  No true radicular symptoms are noted.  Difficulty with internal and external range of motion of the hip.  Procedure: Real-time Ultrasound Guided Injection of right hamstring tendon and ischial bursa Device: GE Logiq Q7 Ultrasound guided injection is preferred based studies that show increased duration, increased effect, greater accuracy, decreased procedural pain, increased response rate, and decreased cost with  ultrasound  guided versus blind injection.  Verbal informed consent obtained.  Time-out conducted.  Noted no overlying erythema, induration, or other signs of local infection.  Skin prepped in a sterile fashion.  Local anesthesia: Topical Ethyl chloride.  With sterile technique and under real time ultrasound guidance: With a 21-gauge 2 inch needle injected with 1 cc of 0.5% Marcaine and 1 cc of Kenalog  40 mg/mL. Completed without difficulty  Pain immediately resolved suggesting accurate placement of the medication.  Advised to call if fevers/chills, erythema, induration, drainage, or persistent bleeding.  Images saved Impression: Technically successful ultrasound guided injection.    Impression and Recommendations:     The above documentation has been reviewed and is accurate and complete Keenan Dimitrov M Georganna Maxson, DO

## 2024-03-24 NOTE — Therapy (Signed)
 OUTPATIENT PHYSICAL THERAPY LOWER EXTREMITY TREATMENT   Patient Name: Tanner [name] MRN: [id number] DOB:1935-04-02, 89 y.o., male Today's Date: 03/24/2024  END OF SESSION:  PT End of Session - 03/24/24 1104     Visit Number 7    Number of Visits 8    Date for PT Re-Evaluation 04/26/24    Authorization Type [employer]    Authorization Time Period [employer] approved 8 visits 03/01/2024 - 04/26/2024    Authorization - Visit Number 7    Authorization - Number of Visits 8    Progress Note Due on Visit 10    PT Start Time 1102    PT Stop Time 1152    PT Time Calculation (min) 50 min    Activity Tolerance Patient tolerated treatment well    Behavior During Therapy [facility] for tasks assessed/performed          Past Medical History:  Diagnosis Date   Allergy    seasonal- mostly spring   Anemia 02/03/2011   ASTHMA, CHILDHOOD 03/17/2010   mostly as child   BCC (basal cell carcinoma) 12/08/2011   CAROTID ARTERY OCCLUSION, WITH INFARCTION 03/17/2010   CEREBROVASCULAR ACCIDENT 03/17/2010   partial loss of peripheral vision on left-slight improved   CHICKENPOX, HX OF 03/17/2010   Constipation 12/08/2014   Epistaxis 09/06/2013   FATIGUE 03/17/2010   Hearing loss 11/04/2010   bilateral   HYPERKALEMIA 05/27/2010   Hyperlipidemia    Hypertension    Hypothyroid 12/08/2011   Mixed hyperlipidemia 03/17/2010   Oral lesion 11/20/2014   Overweight(278.02) 07/01/2010   Peripheral neuropathy 08/18/2016   Personal history of other infectious and parasitic disease 03/17/2010   Preventative health care 08/23/2016   Skin lesion of right arm 11/20/2014   Sleep apnea    uses mouthpiece only   Thrombocytopenia (HCC) 04/05/2012   TOBACCO ABUSE, HX OF 03/17/2010   Tubular adenoma of colon 06/10/2011   Past Surgical History:  Procedure Laterality Date   CARDIOVERSION N/A 08/11/2019   Procedure: CARDIOVERSION;  Surgeon: Delford Maude BROCKS, MD;  Location: [facility] ENDOSCOPY;  Service: Cardiovascular;  Laterality: N/A;    CARDIOVERSION N/A 12/05/2021   Procedure: CARDIOVERSION;  Surgeon: Delford Maude BROCKS, MD;  Location: [facility] ENDOSCOPY;  Service: Cardiovascular;  Laterality: N/A;   CAROTID ENDARTERECTOMY Left February 13, 2013   cea   CATARACT EXTRACTION, BILATERAL Right    Cataract, and stigmatism   COLONOSCOPY  Dec. 2014   ENDARTERECTOMY Left 02/13/2013   Procedure: ENDARTERECTOMY CAROTID;  Surgeon: Krystal JULIANNA Doing, MD;  Location: [facility] OR;  Service: Vascular;  Laterality: Left;   EUS N/A 08/17/2013   Procedure: UPPER ENDOSCOPIC ULTRASOUND (EUS) LINEAR;  Surgeon: Toribio SHAUNNA Cedar, MD;  Location: [facility] ENDOSCOPY;  Service: Endoscopy;  Laterality: N/A;   knee cartliage repair  1964, 1990   bilateral   PILONIDAL CYST EXCISION  1968   removed   ROTATOR CUFF REPAIR     right   TONSILLECTOMY     UPPER GI ENDOSCOPY  Jan. 2015   Patient Active Problem List   Diagnosis Date Noted   Hamstring injury, right, initial encounter 02/10/2024   Balance disorder 02/10/2024   Disorder of vitamin B12 05/27/2022   Urinary frequency 05/27/2022   Greater trochanteric bursitis, left 02/02/2022   Insomnia 11/11/2021   Loss of transverse plantar arch 12/31/2020   Low vitamin D  level 11/08/2020   Degenerative arthritis of right knee 11/08/2020   Feeling grief 08/14/2020   Hypercoagulable state due to persistent atrial fibrillation (HCC)  08/02/2019   Persistent atrial fibrillation (HCC) 06/04/2019   Educated about COVID-19 virus infection 04/02/2019   Hyperglycemia 04/05/2018   Cough 04/05/2018   Preventative health care 08/23/2016   Peripheral neuropathy 08/18/2016   Constipation 12/08/2014   Skin lesion of right arm 11/20/2014   Oral lesion 11/20/2014   Medicare annual wellness visit, subsequent 05/27/2014   Occlusion and stenosis of carotid artery without mention of cerebral infarction 09/19/2013   Plantar fasciitis of left foot 05/22/2013   Varicose veins of leg with pain 05/22/2013   OSA (obstructive sleep apnea) 12/03/2012    Thrombocytopenia (HCC) 04/05/2012   Hypothyroid 12/08/2011   BCC (basal cell carcinoma) 12/08/2011   Tubular adenoma of colon 06/10/2011   Anemia 02/03/2011   Hearing loss 11/04/2010   Overweight 07/01/2010   HYPERKALEMIA 05/27/2010   Mixed hyperlipidemia 03/17/2010   Essential hypertension, benign 03/17/2010   Mild atherosclerosis of carotid artery 03/17/2010   H/O: CVA (cerebrovascular accident) 03/17/2010   ALLERGIC RHINITIS CAUSE UNSPECIFIED 03/17/2010   ASTHMA, CHILDHOOD 03/17/2010   TOBACCO ABUSE, HX OF 03/17/2010   CHICKENPOX, HX OF 03/17/2010    PCP:    [name] LABOR, MD    REFERRING PROVIDER: Claudene Arthea HERO, DO  REFERRING DIAG: R26.89 (ICD-10-CM) - Balance disorder S76.301A (ICD-10-CM) - Hamstring injury, right, initial encoun  THERAPY DIAG:  Pain in right leg  Difficulty in walking, not elsewhere classified  Muscle weakness (generalized)  Cramp and spasm  Abnormal posture  Rationale for Evaluation and Treatment: Rehabilitation  ONSET DATE: 02/10/2024  SUBJECTIVE:   SUBJECTIVE STATEMENT: I took a walk to see how my tolerance is now and I could walk longer/further. It was about 3 or 4 blocks before I started to notice some weakness running down the back of my Rt leg, but no pain.   PERTINENT HISTORY: na PAIN:  Not currently  PRECAUTIONS: Fall  RED FLAGS: None   WEIGHT BEARING RESTRICTIONS: No  FALLS:  Has patient fallen in last 6 months? No  LIVING ENVIRONMENT: Lives with: lives alone Lives in: House/apartment Stairs: Yes: Internal: 12 steps; on right going up and External: 4 steps; on right going up Has following equipment at home: None  OCCUPATION: retired  PLOF: Independent, Independent with basic ADLs, Independent with household mobility without device, Independent with community mobility without device, Independent with homemaking with ambulation, Independent with gait, and Independent with transfers  PATIENT GOALS: to get rid  of the pain  NEXT MD VISIT: prn  OBJECTIVE:  Note: Objective measures were completed at Evaluation unless otherwise noted.  DIAGNOSTIC FINDINGS: na  PATIENT SURVEYS:  LEFS (completed on ipad) Extreme difficulty/unable (0), Quite a bit of difficulty (1), Moderate difficulty (2), Little difficulty (3), No difficulty (4) Survey date:  03/01/24  Any of your usual work, housework or school activities   2. Usual hobbies, recreational or sporting activities   3. Getting into/out of the bath   4. Walking between rooms   5. Putting on socks/shoes   6. Squatting    7. Lifting an object, like a bag of groceries from the floor   8. Performing light activities around your home   9. Performing heavy activities around your home   10. Getting into/out of a car   11. Walking 2 blocks   12. Walking 1 mile   13. Going up/down 10 stairs (1 flight)   14. Standing for 1 hour   15.  sitting for 1 hour   16. Running on even ground  17. Running on uneven ground   18. Making sharp turns while running fast   19. Hopping    20. Rolling over in bed   Score total:  54/80     COGNITION: Overall cognitive status: Within functional limits for tasks assessed     SENSATION: WFL  MUSCLE LENGTH: Hamstrings: Right 70 deg; Left 70 deg Thomas test: Right pos; Left pos  POSTURE: rounded shoulders, forward head, decreased lumbar lordosis, and posterior pelvic tilt  PALPATION: na  LOWER EXTREMITY ROM:  WFL  LOWER EXTREMITY MMT:  Generally 4+ to 5/5  LOWER EXTREMITY SPECIAL TESTS:  Hip special tests: Belvie (FABER) test: negative, Thomas test: positive , Ober's test: positive , and Piriformis test: positive   FUNCTIONAL TESTS:  5 times sit to stand: 10.70 sec Timed up and go (TUG): 9.88 sec  GAIT: Distance walked: 30 feet  Assistive device utilized: None Level of assistance: Complete Independence Comments: Occasional misstep, possible neuropathy                                                                                                                                TREATMENT DATE:  03/24/24: Therapeutic Exercise NuStep: Level 5 UE10/LE 12, x 6 mins for warm up Hip Machine for following: 30# hip 3 way into flex, abd and ext x 12 each with VC's for correct LE postioning Seated piriformis stretch 2 x 45-60 sec (all done at end of session today) Seated EOB for bil HS stretch x 2 reps, 30-45 sec Standing quad/hip flexor stretch 2 x 20 sec with foot in chair behind him with UE support at // bars Standing HS stretch with heel on Power Plate x 30 sec each Therapeutic Activities  Sit to stand 2 x 12 reps with green theraband above knees, pt reports no Rt knee pain with this position Supine posterior pelvic tilt x 15 with improved technique today, only required brief tactile cues to remind of full tilt, then able to return correct demo. Then continued tilts with clams with green theraband around distal thighs,  purple ball squeeze then, alt march 3 x 7 each  Supine bridges x 15 with good technique Neuro Muscular Re Ed  Resisted walking 20# Rt sidestepping, 13# front, 13# Lt sidestepping ( with 1 additional at 17#) and 17# back, 5 reps each with SBA-CGA throughout for safety  03/22/24: Therapeutic Exercise NuStep: Level 5 UE10/LE 12, x 5 mins Hip Machine for following: 30# hip 3 way into flex, abd and ext x 12 each returning therapist demo for each and VC's for correct LE positioning Seated piriformis stretch 2 x 30 sec (all done at end of session today) Seated EOB for bil HS stretch x 2 reps, 30 sec Standing quad/hip flexor stretch 2 x 20 sec with foot in chair behind him with UE support at // bars Standing HS stretch with heel on Power Plate x 30 sec each Therapeutic Activities  Step ups  6  x 10 bil, started having Rt knee discomfort today at end of reps so only one set today Supine posterior pelvic tilt x 15 focusing on proper technique, some improvement with technique noted  today after tactile cuing initially to remind of full motion. Then continued tilts with clams,  purple ball squeeze then, alt march 3 x 5 each requiring VC's during for tilt but again, with improved technique Supine bridges x 15, VC's initially to remind of full hip ext, then able to return correct demo Neuro Muscular Re Ed  Resisted walking 20# Rt sidestepping, 13# front, 10# Lt sidestepping and front, 5 reps each with SBA-CGA throughout for safety  03/17/24: Therapeutic Exercise NuStep: Level 5 UE10/LE 12, x 5 mins Seated piriformis stretch 2 x 30 sec Seated EOB for bil HS stretch x 2 reps, 30 sec Standing quad/hip flexor stretch 2 x 45 sec Standing HS stretch with heel on Power Plate x 30 sec each Therapeutic Activities Neuro Muscular Re Ed  Step ups 6 2 x 10 bil Bil hip 3 way raises with SLS on blue oval and 2# added to each ankle in // bars and encouraging decreasing UE support as able and for core engaged throughout for increased stability Slow and controlled high knee marching 4 laps in bars Resisted walking 20# Rt sidestepping, 10# front and Lt sidestepping 3-5 reps each with CGA throughout for mild LOB until he adjusted to exs Supine bridges x 15 Supine posterior pelvic tilt x 15 focusing on proper technique which pt struggles with due to pelvic and TA weakness. Then continued tilts with purple ball squeeze x 10 and trial of alt march x 5 each requiring VC's during for tilt        PATIENT EDUCATION:  Education details: Educated on pain control, body mechanics, proper lifting technique and cross friction massage with LAX ball  Person educated: Patient Education method: Programmer, multimedia, Facilities manager, Verbal cues, and Handouts Education comprehension: verbalized understanding, returned demonstration, and verbal cues required  HOME EXERCISE PROGRAM: Access Code: [email or web address] URL: [email or web address] Date: 03/01/2024 Prepared by: Delon Haddock  Exercises -  Standing Hamstring Stretch on Chair  - 1 x daily - 7 x weekly - 1 sets - 3 reps - 30 sec hold - Quadricep Stretch with Chair and Counter Support  - 1 x daily - 7 x weekly - 1 sets - 3 reps - 30 sec hold - Seated Figure 4 Piriformis Stretch  - 1 x daily - 7 x weekly - 1 sets - 3 reps - 30 sed hold  ASSESSMENT:  CLINICAL IMPRESSION: Pt able to take a walk since his last session and reports ability to achieve increased distance since starting therapy with no increased pain. Only noted some weakness in Rt HS after ~3-4 blocks. Now only has pain in Rt buttock with prolonged sitting. Continued today with LE strength and stability along with flexibility. Progressed weights with resisted walking as pt conts to be challenged by this and progressed reps with core stability exs with pelvic tilts. His technique with tilt is much improved showing good improvement in core strength.   OBJECTIVE IMPAIRMENTS: difficulty walking, decreased strength, increased fascial restrictions, increased muscle spasms, impaired flexibility, improper body mechanics, postural dysfunction, and pain.   ACTIVITY LIMITATIONS: carrying, lifting, bending, sitting, standing, squatting, sleeping, stairs, transfers, bed mobility, bathing, and dressing  PARTICIPATION LIMITATIONS: meal prep, cleaning, laundry, driving, shopping, community activity, and yard work  PERSONAL FACTORS: Age, Fitness, and 1-2 comorbidities: afib and neuropathy are  also affecting patient's functional outcome.   REHAB POTENTIAL: Good  CLINICAL DECISION MAKING: Stable/uncomplicated  EVALUATION COMPLEXITY: Low   GOALS: Goals reviewed with patient? Yes  SHORT TERM GOALS: Target date: 03/29/2024  Pain report to be no greater than 4/10  Baseline: Goal status: MET 03/15/24  2.  Patient will be independent with initial HEP  Baseline:  Goal status: MET 03/15/24   LONG TERM GOALS: Target date: 04/26/2024  Patient to report pain no greater than 2/10   Baseline:  Goal status: INITIAL  2.  Patient to be independent with advanced HEP  Baseline:  Goal status: INITIAL  3.  Patient to be able to stand or walk for at least 15 min without leg pain  Baseline:  Goal status: INITIAL  4.  Patient to report 85% improvement in overall symptoms  Baseline:  Goal status: INITIAL  5.  LEFS score to improve to at least 60/80 Baseline:  Goal status: INITIAL  6.  Patient to be able to do SLS on each LE for at least 10 sec withouth LOB Baseline:  Goal status: INITIAL   PLAN:  PT FREQUENCY: 1-2x/week  PT DURATION: 8 weeks  PLANNED INTERVENTIONS: [id number]-Therapeutic exercises, 97530- Therapeutic activity, [id number]- Neuromuscular re-education, 97535- Self Care, [id number]- Manual therapy, [id number]- Gait training, [phone]- Canalith repositioning, [id number]- Aquatic Therapy, [phone]- Electrical stimulation (unattended), [id number]- Electrical stimulation (manual), [id number]- Vasopneumatic device, [id number]- Ultrasound, [id number]- Ionotophoresis 4mg /ml Dexamethasone, Patient/Family education, Balance training, Stair training, Taping, Joint mobilization, Spinal mobilization, Vestibular training, Visual/preceptual remediation/compensation, DME instructions, Cryotherapy, and Moist heat  PLAN FOR NEXT SESSION: Ins auth and MD renewal required next session if pt to cont. Cont Nustep (although limit time), progress core strengthening, and cont 4 way hip and balance training, and cont resisted walking; progress HEP prn   Berwyn Knights, PTA 03/24/24 12:10 PM [facility] [address], [address] [place], [place] [address] Phone # [phone] Fax [phone]

## 2024-03-27 ENCOUNTER — Other Ambulatory Visit: Payer: Self-pay

## 2024-03-27 ENCOUNTER — Ambulatory Visit: Admitting: Family Medicine

## 2024-03-27 ENCOUNTER — Telehealth: Payer: Self-pay

## 2024-03-27 ENCOUNTER — Encounter: Payer: Self-pay | Admitting: Family Medicine

## 2024-03-27 VITALS — BP 138/70 | HR 49 | Ht 73.0 in | Wt 206.2 lb

## 2024-03-27 DIAGNOSIS — E782 Mixed hyperlipidemia: Secondary | ICD-10-CM

## 2024-03-27 DIAGNOSIS — M79604 Pain in right leg: Secondary | ICD-10-CM | POA: Diagnosis not present

## 2024-03-27 DIAGNOSIS — R7989 Other specified abnormal findings of blood chemistry: Secondary | ICD-10-CM

## 2024-03-27 DIAGNOSIS — E039 Hypothyroidism, unspecified: Secondary | ICD-10-CM

## 2024-03-27 DIAGNOSIS — R739 Hyperglycemia, unspecified: Secondary | ICD-10-CM

## 2024-03-27 DIAGNOSIS — I10 Essential (primary) hypertension: Secondary | ICD-10-CM

## 2024-03-27 DIAGNOSIS — S76301A Unspecified injury of muscle, fascia and tendon of the posterior muscle group at thigh level, right thigh, initial encounter: Secondary | ICD-10-CM

## 2024-03-27 NOTE — Assessment & Plan Note (Signed)
 Patient given injection in this area.  Discussed for the first 72 hours to be extremely careful with increasing activity.  We discussed with patient that hopefully this is a significant difference as well as help us  diagnostically.  Increase activity slowly.  Follow-up with me again in 2 to 3 months.  Worsening pain need to consider the possibility of lumbar radiculopathy and further workup with imaging.

## 2024-03-27 NOTE — Telephone Encounter (Signed)
 Copied from CRM [id number]. Topic: Appointments - Appointment Info/Confirmation >> Mar 27, 2024  9:24 AM Rosina BIRCH wrote: Patient/patient representative is calling for information regarding an appointment.   Patient want blood work before his 6 month appointment. Patient would like a message sent on mychart regarding the lab appointment [phone]

## 2024-03-27 NOTE — Patient Instructions (Signed)
 Restart exercises Take it easy for 3 days though See me in 3 months

## 2024-03-28 ENCOUNTER — Encounter: Payer: Self-pay | Admitting: Family Medicine

## 2024-03-29 ENCOUNTER — Other Ambulatory Visit

## 2024-03-29 ENCOUNTER — Ambulatory Visit

## 2024-03-29 ENCOUNTER — Ambulatory Visit: Payer: Self-pay | Admitting: Family Medicine

## 2024-03-29 DIAGNOSIS — R262 Difficulty in walking, not elsewhere classified: Secondary | ICD-10-CM

## 2024-03-29 DIAGNOSIS — E782 Mixed hyperlipidemia: Secondary | ICD-10-CM | POA: Diagnosis not present

## 2024-03-29 DIAGNOSIS — R252 Cramp and spasm: Secondary | ICD-10-CM

## 2024-03-29 DIAGNOSIS — R293 Abnormal posture: Secondary | ICD-10-CM

## 2024-03-29 DIAGNOSIS — M6281 Muscle weakness (generalized): Secondary | ICD-10-CM

## 2024-03-29 DIAGNOSIS — I10 Essential (primary) hypertension: Secondary | ICD-10-CM | POA: Diagnosis not present

## 2024-03-29 DIAGNOSIS — R7989 Other specified abnormal findings of blood chemistry: Secondary | ICD-10-CM

## 2024-03-29 DIAGNOSIS — M79604 Pain in right leg: Secondary | ICD-10-CM

## 2024-03-29 DIAGNOSIS — R739 Hyperglycemia, unspecified: Secondary | ICD-10-CM | POA: Diagnosis not present

## 2024-03-29 DIAGNOSIS — E039 Hypothyroidism, unspecified: Secondary | ICD-10-CM

## 2024-03-29 LAB — CBC WITH DIFFERENTIAL/PLATELET
Basophils Absolute: 0 K/uL (ref 0.0–0.1)
Basophils Relative: 0.7 % (ref 0.0–3.0)
Eosinophils Absolute: 0.3 K/uL (ref 0.0–0.7)
Eosinophils Relative: 5 % (ref 0.0–5.0)
HCT: 40.5 % (ref 39.0–52.0)
Hemoglobin: 13.4 g/dL (ref 13.0–17.0)
Lymphocytes Relative: 20.9 % (ref 12.0–46.0)
Lymphs Abs: 1.2 K/uL (ref 0.7–4.0)
MCHC: 33 g/dL (ref 30.0–36.0)
MCV: 96.1 fl (ref 78.0–100.0)
Monocytes Absolute: 0.5 K/uL (ref 0.1–1.0)
Monocytes Relative: 9.3 % (ref 3.0–12.0)
Neutro Abs: 3.7 K/uL (ref 1.4–7.7)
Neutrophils Relative %: 64.1 % (ref 43.0–77.0)
Platelets: 154 K/uL (ref 150.0–400.0)
RBC: 4.21 Mil/uL — ABNORMAL LOW (ref 4.22–5.81)
RDW: 14.5 % (ref 11.5–15.5)
WBC: 5.8 K/uL (ref 4.0–10.5)

## 2024-03-29 LAB — COMPREHENSIVE METABOLIC PANEL WITH GFR
ALT: 12 U/L (ref 0–53)
AST: 13 U/L (ref 0–37)
Albumin: 4.5 g/dL (ref 3.5–5.2)
Alkaline Phosphatase: 64 U/L (ref 39–117)
BUN: 24 mg/dL — ABNORMAL HIGH (ref 6–23)
CO2: 27 meq/L (ref 19–32)
Calcium: 9.7 mg/dL (ref 8.4–10.5)
Chloride: 105 meq/L (ref 96–112)
Creatinine, Ser: 1.25 mg/dL (ref 0.40–1.50)
GFR: 51.89 mL/min — ABNORMAL LOW (ref >60.00)
Glucose, Bld: 96 mg/dL (ref 70–99)
Potassium: 4.3 meq/L (ref 3.5–5.1)
Sodium: 140 meq/L (ref 135–145)
Total Bilirubin: 0.8 mg/dL (ref 0.2–1.2)
Total Protein: 7.5 g/dL (ref 6.0–8.3)

## 2024-03-29 LAB — LIPID PANEL
Cholesterol: 170 mg/dL (ref 0–200)
HDL: 51.7 mg/dL (ref >39.00)
LDL Cholesterol: 97 mg/dL (ref 0–99)
NonHDL: 118.6
Total CHOL/HDL Ratio: 3
Triglycerides: 108 mg/dL (ref 0.0–149.0)
VLDL: 21.6 mg/dL (ref 0.0–40.0)

## 2024-03-29 LAB — VITAMIN D 25 HYDROXY (VIT D DEFICIENCY, FRACTURES): VITD: 52.23 ng/mL (ref 30.00–100.00)

## 2024-03-29 LAB — TSH: TSH: 1.7 u[IU]/mL (ref 0.35–5.50)

## 2024-03-29 LAB — HEMOGLOBIN A1C: Hgb A1c MFr Bld: 6.1 % (ref 4.6–6.5)

## 2024-03-29 NOTE — Addendum Note (Signed)
 Addended by: Haevyn Ury C on: 03/29/2024 08:35 AM   Modules accepted: Orders

## 2024-03-29 NOTE — Telephone Encounter (Signed)
 Patient was advised and lab order was placed.

## 2024-03-29 NOTE — Therapy (Signed)
 OUTPATIENT PHYSICAL THERAPY LOWER EXTREMITY TREATMENT PHYSICAL THERAPY DISCHARGE SUMMARY  Visits from Start of Care: 8  Current functional level related to goals / functional outcomes: See below   Remaining deficits: See below   Education / Equipment: See below   Patient agrees to discharge. Patient goals were met. Patient is being discharged due to being pleased with the current functional level.    Patient Name: Tanner [name] MRN: [id number] DOB:[birth year], 89 y.o., male Today's Date: 03/29/2024  END OF SESSION:  PT End of Session - 03/29/24 1018     Visit Number 8    Number of Visits 8    Date for PT Re-Evaluation 04/26/24    Authorization Type [employer]    Authorization Time Period [employer] approved 8 visits 03/01/2024 - 04/26/2024    Authorization - Visit Number 8    Authorization - Number of Visits 8    Progress Note Due on Visit 10    PT Start Time 1018    PT Stop Time 1100    PT Time Calculation (min) 42 min    Activity Tolerance Patient tolerated treatment well    Behavior During Therapy [facility] for tasks assessed/performed          Past Medical History:  Diagnosis Date   Allergy    seasonal- mostly spring   Anemia 02/03/2011   ASTHMA, CHILDHOOD 03/17/2010   mostly as child   BCC (basal cell carcinoma) 12/08/2011   CAROTID ARTERY OCCLUSION, WITH INFARCTION 03/17/2010   CEREBROVASCULAR ACCIDENT 03/17/2010   partial loss of peripheral vision on left-slight improved   CHICKENPOX, HX OF 03/17/2010   Constipation 12/08/2014   Epistaxis 09/06/2013   FATIGUE 03/17/2010   Hearing loss 11/04/2010   bilateral   HYPERKALEMIA 05/27/2010   Hyperlipidemia    Hypertension    Hypothyroid 12/08/2011   Mixed hyperlipidemia 03/17/2010   Oral lesion 11/20/2014   Overweight(278.02) 07/01/2010   Peripheral neuropathy 08/18/2016   Personal history of other infectious and parasitic disease 03/17/2010   Preventative health care 08/23/2016   Skin lesion of right arm 11/20/2014   Sleep apnea     uses mouthpiece only   Thrombocytopenia (HCC) 04/05/2012   TOBACCO ABUSE, HX OF 03/17/2010   Tubular adenoma of colon 06/10/2011   Past Surgical History:  Procedure Laterality Date   CARDIOVERSION N/A 08/11/2019   Procedure: CARDIOVERSION;  Surgeon: Delford Maude BROCKS, MD;  Location: [facility] ENDOSCOPY;  Service: Cardiovascular;  Laterality: N/A;   CARDIOVERSION N/A 12/05/2021   Procedure: CARDIOVERSION;  Surgeon: Delford Maude BROCKS, MD;  Location: [facility] ENDOSCOPY;  Service: Cardiovascular;  Laterality: N/A;   CAROTID ENDARTERECTOMY Left February 13, 2013   cea   CATARACT EXTRACTION, BILATERAL Right    Cataract, and stigmatism   COLONOSCOPY  Dec. 2014   ENDARTERECTOMY Left 02/13/2013   Procedure: ENDARTERECTOMY CAROTID;  Surgeon: Krystal JULIANNA Doing, MD;  Location: [facility] OR;  Service: Vascular;  Laterality: Left;   EUS N/A 08/17/2013   Procedure: UPPER ENDOSCOPIC ULTRASOUND (EUS) LINEAR;  Surgeon: Toribio SHAUNNA Cedar, MD;  Location: [facility] ENDOSCOPY;  Service: Endoscopy;  Laterality: N/A;   knee cartliage repair  1964, 1990   bilateral   PILONIDAL CYST EXCISION  1968   removed   ROTATOR CUFF REPAIR     right   TONSILLECTOMY     UPPER GI ENDOSCOPY  Jan. 2015   Patient Active Problem List   Diagnosis Date Noted   Hamstring injury, right, initial encounter 02/10/2024   Balance disorder 02/10/2024  Disorder of vitamin B12 05/27/2022   Urinary frequency 05/27/2022   Greater trochanteric bursitis, left 02/02/2022   Insomnia 11/11/2021   Loss of transverse plantar arch 12/31/2020   Low vitamin D  level 11/08/2020   Degenerative arthritis of right knee 11/08/2020   Feeling grief 08/14/2020   Hypercoagulable state due to persistent atrial fibrillation (HCC) 08/02/2019   Persistent atrial fibrillation (HCC) 06/04/2019   Educated about COVID-19 virus infection 04/02/2019   Hyperglycemia 04/05/2018   Cough 04/05/2018   Preventative health care 08/23/2016   Peripheral neuropathy 08/18/2016   Constipation 12/08/2014    Skin lesion of right arm 11/20/2014   Oral lesion 11/20/2014   Medicare annual wellness visit, subsequent 05/27/2014   Occlusion and stenosis of carotid artery without mention of cerebral infarction 09/19/2013   Plantar fasciitis of left foot 05/22/2013   Varicose veins of leg with pain 05/22/2013   OSA (obstructive sleep apnea) 12/03/2012   Thrombocytopenia (HCC) 04/05/2012   Hypothyroid 12/08/2011   BCC (basal cell carcinoma) 12/08/2011   Tubular adenoma of colon 06/10/2011   Anemia 02/03/2011   Hearing loss 11/04/2010   Overweight 07/01/2010   HYPERKALEMIA 05/27/2010   Mixed hyperlipidemia 03/17/2010   Essential hypertension, benign 03/17/2010   Mild atherosclerosis of carotid artery 03/17/2010   H/O: CVA (cerebrovascular accident) 03/17/2010   ALLERGIC RHINITIS CAUSE UNSPECIFIED 03/17/2010   ASTHMA, CHILDHOOD 03/17/2010   TOBACCO ABUSE, HX OF 03/17/2010   CHICKENPOX, HX OF 03/17/2010    PCP:    [name] LABOR, MD    REFERRING PROVIDER: Claudene Arthea HERO, DO  REFERRING DIAG: R26.89 (ICD-10-CM) - Balance disorder S76.301A (ICD-10-CM) - Hamstring injury, right, initial encoun  THERAPY DIAG:  Pain in right leg  Difficulty in walking, not elsewhere classified  Muscle weakness (generalized)  Cramp and spasm  Abnormal posture  Rationale for Evaluation and Treatment: Rehabilitation  ONSET DATE: 02/10/2024  SUBJECTIVE:   SUBJECTIVE STATEMENT: Patient reports he is doing well. He had cortisone to right lateral hip and this made a huge difference.  He is still having the hamstring pain when he walks up inclines or for more than 30 min but this is much improved.  He feels he would like to try to continue on his own.    PERTINENT HISTORY: na PAIN:  Not currently  PRECAUTIONS: Fall  RED FLAGS: None   WEIGHT BEARING RESTRICTIONS: No  FALLS:  Has patient fallen in last 6 months? No  LIVING ENVIRONMENT: Lives with: lives alone Lives in:  House/apartment Stairs: Yes: Internal: 12 steps; on right going up and External: 4 steps; on right going up Has following equipment at home: None  OCCUPATION: retired  PLOF: Independent, Independent with basic ADLs, Independent with household mobility without device, Independent with community mobility without device, Independent with homemaking with ambulation, Independent with gait, and Independent with transfers  PATIENT GOALS: to get rid of the pain  NEXT MD VISIT: prn  OBJECTIVE:  Note: Objective measures were completed at Evaluation unless otherwise noted.  DIAGNOSTIC FINDINGS: na  PATIENT SURVEYS:  LEFS (completed on ipad) Extreme difficulty/unable (0), Quite a bit of difficulty (1), Moderate difficulty (2), Little difficulty (3), No difficulty (4) Survey date:  03/01/24 03/29/24  Any of your usual work, housework or school activities  4  2. Usual hobbies, recreational or sporting activities  3  3. Getting into/out of the bath  4  4. Walking between rooms  4  5. Putting on socks/shoes  4  6. Squatting   3  7. Lifting an object, like a bag of groceries from the floor  4  8. Performing light activities around your home  4  9. Performing heavy activities around your home  3  10. Getting into/out of a car  4  11. Walking 2 blocks  3  12. Walking 1 mile  3  13. Going up/down 10 stairs (1 flight)  3  14. Standing for 1 hour  4  15.  sitting for 1 hour  4  16. Running on even ground  2  17. Running on uneven ground  2  18. Making sharp turns while running fast  2  19. Hopping   2  20. Rolling over in bed  4  Score total:  54/80 66/80     COGNITION: Overall cognitive status: Within functional limits for tasks assessed     SENSATION: WFL  MUSCLE LENGTH: Hamstrings: Right 70 deg; Left 70 deg Thomas test: Right pos; Left pos  POSTURE: rounded shoulders, forward head, decreased lumbar lordosis, and posterior pelvic tilt  PALPATION: na  LOWER EXTREMITY  ROM:  WFL  LOWER EXTREMITY MMT:  Generally 4+ to 5/5  LOWER EXTREMITY SPECIAL TESTS:  Hip special tests: Belvie (FABER) test: negative, Thomas test: positive , Ober's test: positive , and Piriformis test: positive   FUNCTIONAL TESTS:  5 times sit to stand: 10.70 sec Timed up and go (TUG): 9.88 sec  03/29/24: 5 times sit to stand: 7.90 sec Timed up and go (TUG): 6.67 sec  GAIT: Distance walked: 30 feet  Assistive device utilized: None Level of assistance: Complete Independence Comments: Occasional misstep, possible neuropathy                                                                                                                               TREATMENT DATE:  03/29/24 Nustep x 5 min level 5 PT present to discuss status/progress toward goals Re-assessment for DC or recert completed Discussed DC vs Recert with patient: pros/cons, appropriateness Reviewed all of HEP, made corrections to technique on several stretches: stressed importance of consistency and being compliant, went over DC plan and how to progress walking program appropriately, when to contact MD if symptoms return.   Added hip strengthening to HEP and handouts provided.    03/24/24: Therapeutic Exercise NuStep: Level 5 UE10/LE 12, x 6 mins for warm up Hip Machine for following: 30# hip 3 way into flex, abd and ext x 12 each with VC's for correct LE postioning Seated piriformis stretch 2 x 45-60 sec (all done at end of session today) Seated EOB for bil HS stretch x 2 reps, 30-45 sec Standing quad/hip flexor stretch 2 x 20 sec with foot in chair behind him with UE support at // bars Standing HS stretch with heel on Power Plate x 30 sec each Therapeutic Activities  Sit to stand 2 x 12 reps with green theraband above knees, pt reports no Rt knee pain with  this position Supine posterior pelvic tilt x 15 with improved technique today, only required brief tactile cues to remind of full tilt, then able to return  correct demo. Then continued tilts with clams with green theraband around distal thighs,  purple ball squeeze then, alt march 3 x 7 each  Supine bridges x 15 with good technique Neuro Muscular Re Ed  Resisted walking 20# Rt sidestepping, 13# front, 13# Lt sidestepping ( with 1 additional at 17#) and 17# back, 5 reps each with SBA-CGA throughout for safety  03/22/24: Therapeutic Exercise NuStep: Level 5 UE10/LE 12, x 5 mins Hip Machine for following: 30# hip 3 way into flex, abd and ext x 12 each returning therapist demo for each and VC's for correct LE positioning Seated piriformis stretch 2 x 30 sec (all done at end of session today) Seated EOB for bil HS stretch x 2 reps, 30 sec Standing quad/hip flexor stretch 2 x 20 sec with foot in chair behind him with UE support at // bars Standing HS stretch with heel on Power Plate x 30 sec each Therapeutic Activities  Step ups 6  x 10 bil, started having Rt knee discomfort today at end of reps so only one set today Supine posterior pelvic tilt x 15 focusing on proper technique, some improvement with technique noted today after tactile cuing initially to remind of full motion. Then continued tilts with clams,  purple ball squeeze then, alt march 3 x 5 each requiring VC's during for tilt but again, with improved technique Supine bridges x 15, VC's initially to remind of full hip ext, then able to return correct demo Neuro Muscular Re Ed  Resisted walking 20# Rt sidestepping, 13# front, 10# Lt sidestepping and front, 5 reps each with SBA-CGA throughout for safety  03/17/24: Therapeutic Exercise NuStep: Level 5 UE10/LE 12, x 5 mins Seated piriformis stretch 2 x 30 sec Seated EOB for bil HS stretch x 2 reps, 30 sec Standing quad/hip flexor stretch 2 x 45 sec Standing HS stretch with heel on Power Plate x 30 sec each Therapeutic Activities Neuro Muscular Re Ed  Step ups 6 2 x 10 bil Bil hip 3 way raises with SLS on blue oval and 2# added to each ankle  in // bars and encouraging decreasing UE support as able and for core engaged throughout for increased stability Slow and controlled high knee marching 4 laps in bars Resisted walking 20# Rt sidestepping, 10# front and Lt sidestepping 3-5 reps each with CGA throughout for mild LOB until he adjusted to exs Supine bridges x 15 Supine posterior pelvic tilt x 15 focusing on proper technique which pt struggles with due to pelvic and TA weakness. Then continued tilts with purple ball squeeze x 10 and trial of alt march x 5 each requiring VC's during for tilt   PATIENT EDUCATION:  Education details: Educated on pain control, body mechanics, proper lifting technique and cross friction massage with LAX ball  Person educated: Patient Education method: Programmer, multimedia, Facilities manager, Verbal cues, and Handouts Education comprehension: verbalized understanding, returned demonstration, and verbal cues required  HOME EXERCISE PROGRAM: Access Code: [email or web address] URL: [email or web address] Date: 03/01/2024 Prepared by: Delon Haddock  Exercises - Standing Hamstring Stretch on Chair  - 1 x daily - 7 x weekly - 1 sets - 3 reps - 30 sec hold - Quadricep Stretch with Chair and Counter Support  - 1 x daily - 7 x weekly - 1 sets - 3 reps - 30 sec hold -  Seated Figure 4 Piriformis Stretch  - 1 x daily - 7 x weekly - 1 sets - 3 reps - 30 sed hold  ASSESSMENT:  CLINICAL IMPRESSION: Gerlene has made excellent progress.  He had a cortisone injection in the right bursa this week and he is doing significantly better.  He is admittedly not fully compliant with his HEP.  He tends to do 1 or 2 of the exercises and not all of them as instructed.  He demonstrates significant improvement in objective findings and will likely do well if he is compliant with his HEP.  We discussed the merits of doing the exercises consistently which will likely lead to further improvement which will give him more confidence in doing them  properly.  He would like to be discharged to try working independently for a while.  He will f/u with MD if symptoms digress.  We will DC at this time.     OBJECTIVE IMPAIRMENTS: difficulty walking, decreased strength, increased fascial restrictions, increased muscle spasms, impaired flexibility, improper body mechanics, postural dysfunction, and pain.   ACTIVITY LIMITATIONS: carrying, lifting, bending, sitting, standing, squatting, sleeping, stairs, transfers, bed mobility, bathing, and dressing  PARTICIPATION LIMITATIONS: meal prep, cleaning, laundry, driving, shopping, community activity, and yard work  PERSONAL FACTORS: Age, Fitness, and 1-2 comorbidities: afib and neuropathy are also affecting patient's functional outcome.   REHAB POTENTIAL: Good  CLINICAL DECISION MAKING: Stable/uncomplicated  EVALUATION COMPLEXITY: Low   GOALS: Goals reviewed with patient? Yes  SHORT TERM GOALS: Target date: 03/29/2024  Pain report to be no greater than 4/10  Baseline: Goal status: MET 03/15/24  2.  Patient will be independent with initial HEP  Baseline:  Goal status: MET 03/15/24   LONG TERM GOALS: Target date: 04/26/2024  Patient to report pain no greater than 2/10  Baseline:  Goal status: MET 03/29/24  2.  Patient to be independent with advanced HEP  Baseline:  Goal status: Partially met 03/29/24  3.  Patient to be able to stand or walk for at least 15 min without leg pain  Baseline:  Goal status: MET as long as he avoid inclines 03/29/24  4.  Patient to report 85% improvement in overall symptoms  Baseline:  Goal status: MET 03/29/24  5.  LEFS score to improve to at least 60/80 Baseline:  Goal status: MET 03/29/24  6.  Patient to be able to do SLS on each LE for at least 10 sec withouth LOB Baseline:  Goal status: NOT MET   PLAN:  PT FREQUENCY: 1-2x/week  PT DURATION: 8 weeks  PLANNED INTERVENTIONS: 97110-Therapeutic exercises, 97530- Therapeutic activity, [id number]-  Neuromuscular re-education, 97535- Self Care, [id number]- Manual therapy, [id number]- Gait training, [phone]- Canalith repositioning, [id number]- Aquatic Therapy, [id number]- Electrical stimulation (unattended), [id number]- Electrical stimulation (manual), [id number]- Vasopneumatic device, [id number]- Ultrasound, [id number]- Ionotophoresis 4mg /ml Dexamethasone, Patient/Family education, Balance training, Stair training, Taping, Joint mobilization, Spinal mobilization, Vestibular training, Visual/preceptual remediation/compensation, DME instructions, Cryotherapy, and Moist heat  PLAN FOR NEXT SESSION: We will DC at this time per patient request and most goals met.    Delon B. Aamani Moose, PT 03/29/24 3:08 PM [facility] [address] [place], [place] [address] Phone # [phone] Fax [phone]

## 2024-03-29 NOTE — Telephone Encounter (Signed)
 Patient was advised and reports he will be going to [facility].

## 2024-03-30 NOTE — Progress Notes (Signed)
 Patient reviewed via MyChart.

## 2024-04-02 NOTE — Assessment & Plan Note (Signed)
 Tolerating statin, encouraged heart healthy diet, avoid trans fats, minimize simple carbs and saturated fats. Increase exercise as tolerated

## 2024-04-02 NOTE — Assessment & Plan Note (Signed)
 Supplement and monitor

## 2024-04-02 NOTE — Assessment & Plan Note (Signed)
 Follows with catdiology

## 2024-04-02 NOTE — Assessment & Plan Note (Signed)
 On Levothyroxine, continue to monitor

## 2024-04-02 NOTE — Assessment & Plan Note (Signed)
 Well controlled, no changes to meds. Encouraged heart healthy diet such as the DASH diet and exercise as tolerated.

## 2024-04-02 NOTE — Assessment & Plan Note (Signed)
 Rate controlled, tolerating current treatment

## 2024-04-02 NOTE — Assessment & Plan Note (Signed)
 monitor

## 2024-04-03 ENCOUNTER — Encounter: Payer: Self-pay | Admitting: Family Medicine

## 2024-04-03 ENCOUNTER — Ambulatory Visit: Admitting: Family Medicine

## 2024-04-03 VITALS — BP 128/82 | HR 56 | Resp 16 | Ht 73.0 in | Wt 207.0 lb

## 2024-04-03 DIAGNOSIS — Z23 Encounter for immunization: Secondary | ICD-10-CM | POA: Diagnosis not present

## 2024-04-03 DIAGNOSIS — E538 Deficiency of other specified B group vitamins: Secondary | ICD-10-CM | POA: Diagnosis not present

## 2024-04-03 DIAGNOSIS — I10 Essential (primary) hypertension: Secondary | ICD-10-CM

## 2024-04-03 DIAGNOSIS — E782 Mixed hyperlipidemia: Secondary | ICD-10-CM

## 2024-04-03 DIAGNOSIS — I4819 Other persistent atrial fibrillation: Secondary | ICD-10-CM

## 2024-04-03 DIAGNOSIS — R7989 Other specified abnormal findings of blood chemistry: Secondary | ICD-10-CM

## 2024-04-03 DIAGNOSIS — R739 Hyperglycemia, unspecified: Secondary | ICD-10-CM

## 2024-04-03 DIAGNOSIS — D6869 Other thrombophilia: Secondary | ICD-10-CM

## 2024-04-03 DIAGNOSIS — E039 Hypothyroidism, unspecified: Secondary | ICD-10-CM

## 2024-04-03 NOTE — Assessment & Plan Note (Signed)
 [id number] acceptable, minimize simple carbs. Increase exercise as tolerated.

## 2024-04-03 NOTE — Progress Notes (Signed)
 Subjective:    Patient ID: Tanner [name], male    DOB: 1935-01-28, 89 y.o.   MRN: [id number]  Chief Complaint  Patient presents with   Medical Management of Chronic Issues    Patient presents today for a 6 month follow-up   Quality Metric Gaps    Foot & eye exam    HPI Discussed the use of AI scribe software for clinical note transcription with the patient, who gave verbal consent to proceed.  History of Present Illness Tanner [name] is an 89 year old male who presents for routine follow-up and vaccination updates.  He is here to discuss vaccination updates, including the COVID and flu vaccines. He has previously received the COVID vaccine and is considering the Pfizer vaccine if Tanner [name] is unavailable.  He has a history of low red blood cell count, but his hemoglobin and hematocrit levels are normal. His BUN levels have been slightly elevated. His BUN levels have improved over the past year.  He describes changes in his urinary habits, noting increased 'turbulence' and a weaker stream, especially in the morning. No blood in urine or burning sensation, but the stream is weaker in the morning compared to the rest of the day.  He experienced a period of fatigue about two to three months ago, during which he struggled to move around his bedroom without needing to sit down. However, he has since experienced an improvement in energy levels.  He has been experiencing bowel movement irregularities. He is currently taking a medication that initially caused diarrhea when taken twice daily, so he reduced the dose to once daily, which has resulted in more regular bowel movements. He still experiences some difficulty with bowel movements on days when he does not take the medication.    Past Medical History:  Diagnosis Date   Allergy    seasonal- mostly spring   Anemia 02/03/2011   ASTHMA, CHILDHOOD 03/17/2010   mostly as child   BCC (basal cell carcinoma) 12/08/2011   CAROTID  ARTERY OCCLUSION, WITH INFARCTION 03/17/2010   CEREBROVASCULAR ACCIDENT 03/17/2010   partial loss of peripheral vision on left-slight improved   CHICKENPOX, HX OF 03/17/2010   Constipation 12/08/2014   Epistaxis 09/06/2013   FATIGUE 03/17/2010   Hearing loss 11/04/2010   bilateral   HYPERKALEMIA 05/27/2010   Hyperlipidemia    Hypertension    Hypothyroid 12/08/2011   Mixed hyperlipidemia 03/17/2010   Oral lesion 11/20/2014   Overweight(278.02) 07/01/2010   Peripheral neuropathy 08/18/2016   Personal history of other infectious and parasitic disease 03/17/2010   Preventative health care 08/23/2016   Skin lesion of right arm 11/20/2014   Sleep apnea    uses mouthpiece only   Thrombocytopenia (HCC) 04/05/2012   TOBACCO ABUSE, HX OF 03/17/2010   Tubular adenoma of colon 06/10/2011    Past Surgical History:  Procedure Laterality Date   CARDIOVERSION N/A 08/11/2019   Procedure: CARDIOVERSION;  Surgeon: Delford Maude BROCKS, MD;  Location: [facility] ENDOSCOPY;  Service: Cardiovascular;  Laterality: N/A;   CARDIOVERSION N/A 12/05/2021   Procedure: CARDIOVERSION;  Surgeon: Delford Maude BROCKS, MD;  Location: [facility] ENDOSCOPY;  Service: Cardiovascular;  Laterality: N/A;   CAROTID ENDARTERECTOMY Left February 13, 2013   cea   CATARACT EXTRACTION, BILATERAL Right    Cataract, and stigmatism   COLONOSCOPY  Dec. 2014   ENDARTERECTOMY Left 02/13/2013   Procedure: ENDARTERECTOMY CAROTID;  Surgeon: Krystal JULIANNA Doing, MD;  Location: [facility];  Service: Vascular;  Laterality: Left;  EUS N/A 08/17/2013   Procedure: UPPER ENDOSCOPIC ULTRASOUND (EUS) LINEAR;  Surgeon: Toribio SHAUNNA Cedar, MD;  Location: [facility] ENDOSCOPY;  Service: Endoscopy;  Laterality: N/A;   knee cartliage repair  1964, 1990   bilateral   PILONIDAL CYST EXCISION  1968   removed   ROTATOR CUFF REPAIR     right   TONSILLECTOMY     UPPER GI ENDOSCOPY  Jan. 2015    Family History  Problem Relation Age of Onset   Alzheimer's disease Mother    Emphysema Sister        cigarettes    Cancer Sister        lung, tobacco    Heart disease Son    Coronary artery disease Brother    Heart attack Brother    Other Brother        heart problems- from fathers side   Cancer Brother 7       lung? smoker   Heart disease Brother 22       Heart disease before age 26   Cancer Sister        gyn   Heart disease Sister    Bipolar disorder Daughter    Obesity Daughter    Stroke Father    Heart disease Sister    Esophageal cancer Neg Hx    Colon cancer Neg Hx    Pancreatic cancer Neg Hx    Stomach cancer Neg Hx     Social History   Socioeconomic History   Marital status: Divorced    Spouse name: Not on file   Number of children: 3   Years of education: 7yrs   Highest education level: Bachelor's degree (e.g., BA, AB, BS)  Occupational History   Occupation: retired    Associate Professor: RETIRED  Tobacco Use   Smoking status: Former    Current packs/day: 0.00    Average packs/day: 2.0 packs/day for 20.0 years (40.0 ttl pk-yrs)    Types: Cigarettes    Start date: 07/19/1957    Quit date: 07/19/1977    Years since quitting: 46.7    Passive exposure: Never   Smokeless tobacco: Never   Tobacco comments:    Former smoker 12/12/21  Vaping Use   Vaping status: Never Used  Substance and Sexual Activity   Alcohol use: Yes    Alcohol/week: 10.0 standard drinks of alcohol    Types: 7 Glasses of wine, 2 Cans of beer, 1 Shots of liquor per week   Drug use: No   Sexual activity: Not Currently  Other Topics Concern   Not on file  Social History Narrative   Pt lives at home alone.    Caffeine Use: very little   Heart healthy diet, is planning exercise      Retired from Patent examiner   Left handed   Social Drivers of Health   Financial Resource Strain: Low Risk  (03/27/2024)   Overall Financial Resource Strain (CARDIA)    Difficulty of Paying Living Expenses: Not hard at all  Food Insecurity: No Food Insecurity (03/27/2024)   Hunger Vital Sign    Worried About Running Out  of Food in the Last Year: Never true    Ran Out of Food in the Last Year: Never true  Transportation Needs: No Transportation Needs (03/27/2024)   PRAPARE - Administrator, Civil Service (Medical): No    Lack of Transportation (Non-Medical): No  Physical Activity: Insufficiently Active (03/27/2024)   Exercise Vital Sign    Days  of Exercise per Week: 3 days    Minutes of Exercise per Session: 20 min  Stress: No Stress Concern Present (03/27/2024)   [employer] of Occupational Health - Occupational Stress Questionnaire    Feeling of Stress: Not at all  Social Connections: Moderately Integrated (03/27/2024)   Social Connection and Isolation Panel    Frequency of Communication with Friends and Family: More than three times a week    Frequency of Social Gatherings with Friends and Family: More than three times a week    Attends Religious Services: More than 4 times per year    Active Member of [employer] or Organizations: Yes    Attends Engineer, structural: More than 4 times per year    Marital Status: Divorced  Intimate Partner Violence: Not At Risk (09/23/2023)   Humiliation, Afraid, Rape, and Kick questionnaire    Fear of Current or Ex-Partner: No    Emotionally Abused: No    Physically Abused: No    Sexually Abused: No    Outpatient Medications Prior to Visit  Medication Sig Dispense Refill   acetaminophen  (TYLENOL ) 500 MG tablet Take 1,000 mg by mouth as needed for moderate pain or headache.      aspirin  EC 81 MG tablet Take 81 mg by mouth every evening.      chlorthalidone  (HYGROTON ) 25 MG tablet TAKE 1 TABLET BY MOUTH IN THE  MORNING 90 tablet 3   Cholecalciferol (VITAMIN D3) 50 MCG (2000 UT) TABS Take 2,000 Units by mouth daily.     levothyroxine  (SYNTHROID ) 88 MCG tablet TAKE 1 TABLET BY MOUTH DAILY  BEFORE BREAKFAST 90 tablet 3   metoprolol  succinate (TOPROL -XL) 25 MG 24 hr tablet Take 1/2 tablet by mouth daily. May take extra 1/2 tablet daily for breakthrough  afib 180 tablet 1   Multiple Vitamins-Minerals (MULTIVITAMIN WITH MINERALS) tablet Take 1 tablet by mouth daily. Mega men 50+     pantoprazole  (PROTONIX ) 40 MG tablet TAKE 1 TABLET BY MOUTH DAILY 90 tablet 3   Polyethyl Glycol-Propyl Glycol (SYSTANE ULTRA OP) Place 1 drop into both eyes in the morning.     ramipril  (ALTACE ) 10 MG capsule TAKE 1 CAPSULE BY MOUTH TWICE  DAILY 180 capsule 3   rosuvastatin  (CRESTOR ) 40 MG tablet TAKE 1 TABLET BY MOUTH IN THE  EVENING 90 tablet 3   TART CHERRY PO Take 300 mg by mouth daily.     Tenapanor HCl (IBSRELA ) 50 MG TABS Take 50 mg by mouth 2 (two) times daily. 180 tablet 3   vitamin B-12 (CYANOCOBALAMIN ) 1000 MCG tablet Take 2,000 mcg by mouth every Monday, Wednesday, and Friday.     XARELTO  20 MG TABS tablet TAKE 1 TABLET BY MOUTH DAILY  WITH SUPPER 90 tablet 1   No facility-administered medications prior to visit.    No Known Allergies  Review of Systems  Constitutional:  Negative for fever and malaise/fatigue.  HENT:  Negative for congestion.   Eyes:  Negative for blurred vision.  Respiratory:  Negative for shortness of breath.   Cardiovascular:  Negative for chest pain, palpitations and leg swelling.  Gastrointestinal:  Negative for abdominal pain, blood in stool and nausea.  Genitourinary:  Negative for dysuria and frequency.  Musculoskeletal:  Negative for falls.  Skin:  Negative for rash.  Neurological:  Negative for dizziness, loss of consciousness and headaches.  Endo/Heme/Allergies:  Negative for environmental allergies.  Psychiatric/Behavioral:  Negative for depression. The patient is not nervous/anxious.  Objective:    Physical Exam Vitals reviewed.  Constitutional:      Appearance: Normal appearance. He is not ill-appearing.  HENT:     Head: Normocephalic and atraumatic.     Nose: Nose normal.  Eyes:     Conjunctiva/sclera: Conjunctivae normal.  Cardiovascular:     Rate and Rhythm: Normal rate.     Pulses: Normal  pulses.     Heart sounds: Normal heart sounds. No murmur heard. Pulmonary:     Effort: Pulmonary effort is normal.     Breath sounds: Normal breath sounds. No wheezing.  Abdominal:     Palpations: Abdomen is soft. There is no mass.     Tenderness: There is no abdominal tenderness.  Musculoskeletal:     Cervical back: Normal range of motion.     Right lower leg: No edema.     Left lower leg: No edema.  Skin:    General: Skin is warm and dry.  Neurological:     General: No focal deficit present.     Mental Status: He is alert and oriented to person, place, and time.  Psychiatric:        Mood and Affect: Mood normal.     BP 128/82   Pulse (!) 56   Resp 16   Ht 6' 1 (1.854 m)   Wt 207 lb (93.9 kg)   SpO2 97%   BMI 27.31 kg/m  Wt Readings from Last 3 Encounters:  04/03/24 207 lb (93.9 kg)  03/27/24 206 lb 3.2 oz (93.5 kg)  02/17/24 205 lb 12.8 oz (93.4 kg)    Diabetic Foot Exam - Simple   No data filed    Lab Results  Component Value Date   WBC 5.8 03/29/2024   HGB 13.4 03/29/2024   HCT 40.5 03/29/2024   PLT 154.0 03/29/2024   GLUCOSE 96 03/29/2024   CHOL 170 03/29/2024   TRIG 108.0 03/29/2024   HDL 51.70 03/29/2024   LDLCALC 97 03/29/2024   ALT 12 03/29/2024   AST 13 03/29/2024   NA 140 03/29/2024   K 4.3 03/29/2024   CL 105 03/29/2024   CREATININE 1.25 03/29/2024   BUN 24 (H) 03/29/2024   CO2 27 03/29/2024   TSH 1.70 03/29/2024   PSA 0.48 03/08/2023   INR 0.99 02/07/2013   HGBA1C 6.1 03/29/2024    Lab Results  Component Value Date   TSH 1.70 03/29/2024   Lab Results  Component Value Date   WBC 5.8 03/29/2024   HGB 13.4 03/29/2024   HCT 40.5 03/29/2024   MCV 96.1 03/29/2024   PLT 154.0 03/29/2024   Lab Results  Component Value Date   NA 140 03/29/2024   K 4.3 03/29/2024   CO2 27 03/29/2024   GLUCOSE 96 03/29/2024   BUN 24 (H) 03/29/2024   CREATININE 1.25 03/29/2024   BILITOT 0.8 03/29/2024   ALKPHOS 64 03/29/2024   AST 13 03/29/2024    ALT 12 03/29/2024   PROT 7.5 03/29/2024   ALBUMIN 4.5 03/29/2024   CALCIUM  9.7 03/29/2024   ANIONGAP 9 01/13/2023   GFR 51.89 (L) 03/29/2024   Lab Results  Component Value Date   CHOL 170 03/29/2024   Lab Results  Component Value Date   HDL 51.70 03/29/2024   Lab Results  Component Value Date   LDLCALC 97 03/29/2024   Lab Results  Component Value Date   TRIG 108.0 03/29/2024   Lab Results  Component Value Date   CHOLHDL 3 03/29/2024  Lab Results  Component Value Date   HGBA1C 6.1 03/29/2024       Assessment & Plan:  Essential hypertension, benign Assessment & Plan: Well controlled, no changes to meds. Encouraged heart healthy diet such as the DASH diet and exercise as tolerated.     Disorder of vitamin B12 Assessment & Plan: monitor   Hypothyroidism, unspecified type Assessment & Plan: On Levothyroxine , continue to monitor    Hypercoagulable state due to persistent atrial fibrillation (HCC) Assessment & Plan: Rate controlled, tolerating current treatment   Low vitamin D  level Assessment & Plan: Supplement and monitor    Mixed hyperlipidemia Assessment & Plan: Tolerating statin, encouraged heart healthy diet, avoid trans fats, minimize simple carbs and saturated fats. Increase exercise as tolerated   Persistent atrial fibrillation [facility]) Assessment & Plan: Follows with catdiology     Assessment and Plan Assessment & Plan Encounter for immunization Discussion about receiving the flu shot and COVID-19 booster. He has received all necessary vaccinations including RSV and Prevnar. No current need for RSV booster. Plans to receive flu shot today and COVID-19 booster by early October. Discussed potential soreness at injection site as a common side effect. If issues arise with obtaining COVID-19 booster, he can return to clinic for assistance. - Administer flu shot today - Advise to receive COVID-19 booster by early October - If issues arise  with obtaining COVID-19 booster, return to clinic for assistance  Chronic kidney disease Chronic kidney disease with mild reduction in kidney function. BUN levels slightly elevated but improving over the year. GFR reduced, consistent with age-related changes. No acute issues noted. Advised to maintain hydration and avoid nephrotoxic substances. - Maintain adequate hydration - Avoid over-the-counter supplements without consultation - Monitor kidney function with labs in six months  Recording duration: 19 minutes     Harlene Horton, MD

## 2024-04-03 NOTE — Patient Instructions (Signed)
 Atrial Fibrillation Atrial fibrillation (AFib) is a type of irregular or rapid heartbeat (arrhythmia). In AFib, the top part of the heart (atria) beats in an irregular pattern. This makes the heart unable to pump blood normally and effectively. The goal of treatment is to prevent blood clots from forming, control your heart rate, or restore your heartbeat to a normal rhythm. If this condition is not treated, it can cause serious problems, such as a weakened heart muscle (cardiomyopathy) or a stroke. What are the causes? This condition is often caused by medical conditions that damage the heart's electrical system. These include: High blood pressure (hypertension). This is the most common cause. Certain heart problems or conditions, such as heart failure, coronary artery disease, heart valve problems, or heart surgery. Diabetes. Overactive thyroid  (hyperthyroidism). Chronic kidney disease. Certain lung conditions, such as emphysema, pneumonia, or COPD. Obstructive sleep apnea. In some cases, the cause of this condition is not known. What increases the risk? This condition is more likely to develop in: Older adults. Athletes who do endurance exercise. People who have a family history of AFib. Males. People who are Caucasian. People who are obese. People who smoke or misuse alcohol. What are the signs or symptoms? Symptoms of this condition include: Fast or irregular heartbeats (palpitations). Discomfort or pain in your chest. Shortness of breath. Sudden light-headedness or weakness. Tiring easily during exercise or activity. Syncope (fainting). Sweating. In some cases, there are no symptoms. How is this diagnosed? Your health care provider may detect AFib when taking your pulse. If detected, this condition may be diagnosed with: An electrocardiogram (ECG) to check electrical signals of the heart. An ambulatory cardiac monitor to record your heart's activity for a few days. A  transthoracic echocardiogram (TTE) to create pictures of your heart. A transesophageal echocardiogram (TEE) to create even clearer pictures of your heart. A stress test to check your blood supply while you exercise. Imaging tests, such as a CT scan or chest X-ray. Blood tests. How is this treated? Treatment depends on underlying conditions and how you feel when you get AFib. This condition may be treated with: Medicines to prevent blood clots or to treat heart rate or heart rhythm problems. Electrical cardioversion to reset the heart's rhythm. A pacemaker to correct abnormal heart rhythm. Ablation to remove the heart tissue that sends abnormal signals. Left atrial appendage closure to seal the area where blood clots can form. In some cases, underlying conditions will be treated. Follow these instructions at home: Medicines Take over-the counter and prescription medicines only as told by your provider. Do not take any new medicines without talking to your provider. If you are taking blood thinners: Talk with your provider before taking aspirin  or NSAIDs. These medicines can raise your risk of bleeding. Take your medicines as told. Take them at the same time each day. Do not do things that could hurt or bruise you. Be careful to avoid falls. Wear an alert bracelet or carry a card that says that you take blood thinners. Lifestyle Do not use any products that contain nicotine or tobacco. These products include cigarettes, chewing tobacco, and vaping devices, such as e-cigarettes. If you need help quitting, ask your provider. Eat heart-healthy foods. Talk with a food expert (dietitian) to make an eating plan that is right for you. Exercise regularly as told by your provider. Do not drink alcohol. Lose weight if you are overweight. General instructions If you have obstructive sleep apnea, manage your condition as told by your provider.  Do not use diet pills unless your provider approves. Diet  pills can make heart problems worse. Keep all follow-up visits. Your provider will want to check your heart rate and rhythm regularly. Contact a health care provider if: You notice a change in the rate, rhythm, or strength of your heartbeat. You are taking a blood thinner and you notice more bruising. You tire more easily when you exercise or do heavy work. You have a sudden change in weight. Get help right away if:  You have chest pain. You have trouble breathing. You have side effects of blood thinners, such as blood in your vomit, poop (stool), or pee (urine), or bleeding that does not stop. You have any symptoms of a stroke. BE FAST is an easy way to remember the main warning signs of a stroke: B - Balance. Signs are dizziness, sudden trouble walking, or loss of balance. E - Eyes. Signs are trouble seeing or a sudden change in vision. F - Face. Signs are sudden weakness or numbness of the face, or the face or eyelid drooping on one side. A - Arms. Signs are weakness or numbness in an arm. This happens suddenly and usually on one side of the body. S - Speech.Signs are sudden trouble speaking, slurred speech, or trouble understanding what people say. T - Time. Time to call emergency services. Write down what time symptoms started. Other signs of a stroke, such as: A sudden, severe headache with no known cause. Nausea or vomiting. Seizure. These symptoms may be an emergency. Get help right away. Call 911. Do not wait to see if the symptoms will go away. Do not drive yourself to the hospital. This information is not intended to replace advice given to you by your health care provider. Make sure you discuss any questions you have with your health care provider. Document Revised: 03/25/2022 Document Reviewed: 03/25/2022 Elsevier Patient Education  2024 [employer].

## 2024-04-05 ENCOUNTER — Encounter

## 2024-04-07 ENCOUNTER — Telehealth: Payer: Self-pay

## 2024-04-07 NOTE — Telephone Encounter (Signed)
 See note below

## 2024-04-07 NOTE — Telephone Encounter (Signed)
 Pharmacy Patient Advocate Encounter   Received notification from CoverMyMeds that prior authorization for Ibsrela  50MG  tablets is required/requested.   Insurance verification completed.   The patient is insured through [facility].   Prior Authorization form/request asks a question that requires your assistance. Please see the question below and advise accordingly. The PA will not be submitted until the necessary information is received.

## 2024-04-10 NOTE — Telephone Encounter (Signed)
 I have spoken to patient. He states that Tanner [name]  has been extremely beneficial to him. States he has a soft/loose bowel movement every day on the medication and would like to continue as prescribed.

## 2024-04-10 NOTE — Telephone Encounter (Signed)
 Pharmacy Patient Advocate Encounter    Per test claim: PA required; PA submitted to above mentioned insurance via Latent Key/confirmation #/EOC [id number] Status is pending

## 2024-04-10 NOTE — Telephone Encounter (Signed)
 Please to reach out to the patient to see if Ibsrela  is effective for him Documentation of clinical response is needed for prior authorization/continued coverage

## 2024-04-10 NOTE — Telephone Encounter (Signed)
 Left message for patient to call back

## 2024-04-11 ENCOUNTER — Other Ambulatory Visit (HOSPITAL_COMMUNITY): Payer: Self-pay

## 2024-04-11 NOTE — Telephone Encounter (Signed)
 Pharmacy Patient Advocate Encounter  Received notification from [facility] Medicare that Prior Authorization for Ibsrela  50MG  tablets has been APPROVED from 04-10-2024 to 07-19-2024   PA #/Case ID/Reference #: [id number]

## 2024-04-12 ENCOUNTER — Encounter

## 2024-04-19 ENCOUNTER — Encounter

## 2024-04-26 ENCOUNTER — Encounter

## 2024-05-12 ENCOUNTER — Other Ambulatory Visit: Payer: Self-pay | Admitting: Family Medicine

## 2024-06-01 ENCOUNTER — Other Ambulatory Visit: Payer: Self-pay | Admitting: Cardiovascular Disease

## 2024-06-01 DIAGNOSIS — I4819 Other persistent atrial fibrillation: Secondary | ICD-10-CM

## 2024-06-02 NOTE — Telephone Encounter (Signed)
 Prescription refill request for Xarelto  received.  Indication:afib Last office visit:7/25 Weight:93.9  kg Age:89 Scr:1.25  9/25 CrCl:55.3  ml/min  Prescription refilled

## 2024-06-19 NOTE — Progress Notes (Signed)
 Discussed with patient/guardian the use of audio recording for this encounter.  Risks, benefits and alternatives (including option to decline recording) were reviewed.  Patient expressed understanding and verbally confirmed consent to proceed with recording.   Subjective  The following information was reviewed by members of the visit team:  Tobacco  Allergies  Meds  Problems  Med Hx  Surg Hx  OB Status   Fam Hx    Tanner [name] is a 89 y.o. male who presents for Ankle Pain and Ankle Injury (Pt reports right ankle injury, pt states that his toe got caught while getting up from his recliner. Pt ankle appears to be swollen. ) History of Present Illness This is a patient with a history of frequent ankle sprains and neuropathy presenting with ankle pain.  The patient reports that the pain began after tripping over a rug, causing his shoe to catch on the bottom of the back of the rug. This incident led to a fall with his ankle turning and bearing the weight. He describes the initial pain as intense, but it subsided after he remained stationary for 3 to 4 minutes. He was able to walk without difficulty afterward. However, he has since experienced recurrent pain, which he describes as persistent but not unbearable. The pain is localized to the lateral right ankle, unlike previous instances where the entire ankle would swell. He reports no numbness or tingling in his foot, although he has neuropathy. He has not taken any pain medication today. The patient also mentions a past fracture in one of his ankles, though he does not recall which one.  Review of Systems  Constitutional:  Negative for chills and fever.  Respiratory:  Negative for shortness of breath.   Cardiovascular:  Negative for chest pain.  Gastrointestinal:  Negative for abdominal pain, diarrhea, nausea and vomiting.  Genitourinary:  Negative for dysuria.  Musculoskeletal:  Positive for arthralgias, gait problem and joint  swelling.  Neurological:  Negative for dizziness, weakness, numbness and headaches.    Objective  Blood pressure 101/70, pulse 71, temperature 97.3 F (36.3 C), temperature source Oral, resp. rate 18, weight 95.5 kg (210 lb 8 oz), SpO2 99%.  No LMP for male patient.   Behavioral Health Screening  Patient Health Questionnaire-2 Score: 0 (06/19/2024  4:15 PM)      Patient's Depression screening is Negative   Depression Plan: Normal/Negative Screening  Physical Exam Vitals and nursing note reviewed.  Constitutional:      Appearance: Normal appearance. He is normal weight.  Cardiovascular:     Rate and Rhythm: Normal rate and regular rhythm.     Pulses: Normal pulses.     Heart sounds: Normal heart sounds.  Pulmonary:     Effort: Pulmonary effort is normal.     Breath sounds: Normal breath sounds.  Musculoskeletal:     Right ankle: Swelling present. Tenderness present over the lateral malleolus and medial malleolus. Decreased range of motion. Normal pulse.     Comments: No proximal fibula tenderness  Skin:    General: Skin is warm and dry.     Capillary Refill: Capillary refill takes less than 2 seconds.  Neurological:     General: No focal deficit present.     Mental Status: He is alert and oriented to person, place, and time.  Psychiatric:        Mood and Affect: Mood normal.        Behavior: Behavior normal.     No results found for  this or any previous visit (from the past 24 hours).  Radiologist interpretation:    XR Ankle Minimum 3 Views Right  Final Result by Deward Franky Conn, MD (12/01 1711)  X-RAY ANKLE RIGHT (3+ VIEWS), 06/19/2024 4:41 PM    INDICATION: lateral ankle pain, Pain in right ankle and joints of right   foot \ M25.571 Pain in right ankle and joints of right foot   lateral ankle pain  COMPARISON: None.    IMPRESSION:  1.  No acute fracture.   2.  No malalignment.  3.  Ankle soft tissue edema, especially laterally.  4.  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           Assessment & Plan Initial Assessment: Ankle sprain suspected based on symptoms and physical examination findings.  Differential Diagnosis: - Fracture: Unlikely due to x-ray results showing no evidence of fracture. Plan: Wrap ankle, elevate, apply ice, avoid NSAIDs, use Tylenol  650 mg every 6 to 8 hours. - Ankle sprain: Likely due to symptoms and physical examination findings. Plan: Wrap ankle, elevate, apply ice, use Tylenol  650 mg every 6 to 8 hours, consider hydrocodone for pain if needed, follow up with ortho if no improvement in a week.  ED Course: - X-ray obtained, read by me, showing no evidence of fracture.  Final Assessment: X-ray showed no fracture. Treatment includes wrapping the ankle, elevation, ice application, and Tylenol  for pain. Hydrocodone prescribed for a few days if needed. Follow-up with ortho if no improvement in a week.  Clinical Impression: - Ankle sprain  Disposition: - Follow-Up: Follow up with orthopedic specialist if no improvement in a week. MRI may be considered.  Patient Education: Wrap ankle, elevate, apply ice, avoid NSAIDs, use Tylenol  650 mg every 6 to 8 hours. Hydrocodone may cause sleep disturbances.     Attestation    Assessment/Plan   Tanner [name] was seen today for ankle pain and ankle injury.  Diagnoses and all orders for this visit:  Acute right ankle pain -     XR Ankle Minimum 3 Views Right  Sprain of right ankle, unspecified ligament, initial encounter    Patient has been instructed on RX/ OTC medications, dosages, side effects, and possible interactions as associated with each diagnosis in my impression and plan above.  2.   Patient education (verbal/handout) given on diagnosis, pathophysiology, treatment of diagnosis, side effects of medication use for treatment, restrictions while taking medication.  Supportive       Care measures as directed on AVS.  Red Flags  associated with diagnosis/es were reviewed and patient instructed on action plan if red flags develop.  3.   Urgent Care Disposition:  Follow up with PCP       They have been instructed that if symptoms worse that should go to Urgent Care, go to the nearest ED, or activate EMS.  4.   Patient agreed with plan and voiced understanding.  NO barriers to adherence perceived by myself.  Electronically signed: Rosaline Jama Collet FNP  Thu 06/22/2024 6:59 AM

## 2024-06-22 NOTE — Progress Notes (Deleted)
 [name] [address] [place] [address] Phone: [phone] Subjective:    I'm seeing this patient by the request  of:  [name] LABOR, MD  CC:   YEP:Dlagzrupcz  03/27/2024 Patient given injection in this area.  Discussed for the first 72 hours to be extremely careful with increasing activity.  We discussed with patient that hopefully this is a significant difference as well as help us  diagnostically.  Increase activity slowly.  Follow-up with me again in 2 to 3 months.  Worsening pain need to consider the possibility of lumbar radiculopathy and further workup with imaging.      Update 06/27/2024 CRIST KRUSZKA is a 89 y.o. male coming in with complaint of R hamstring pain. Patient states       Past Medical History:  Diagnosis Date   Allergy    seasonal- mostly spring   Anemia 02/03/2011   ASTHMA, CHILDHOOD 03/17/2010   mostly as child   BCC (basal cell carcinoma) 12/08/2011   CAROTID ARTERY OCCLUSION, WITH INFARCTION 03/17/2010   CEREBROVASCULAR ACCIDENT 03/17/2010   partial loss of peripheral vision on left-slight improved   CHICKENPOX, HX OF 03/17/2010   Constipation 12/08/2014   Epistaxis 09/06/2013   FATIGUE 03/17/2010   Hearing loss 11/04/2010   bilateral   HYPERKALEMIA 05/27/2010   Hyperlipidemia    Hypertension    Hypothyroid 12/08/2011   Mixed hyperlipidemia 03/17/2010   Oral lesion 11/20/2014   Overweight(278.02) 07/01/2010   Peripheral neuropathy 08/18/2016   Personal history of other infectious and parasitic disease 03/17/2010   Preventative health care 08/23/2016   Skin lesion of right arm 11/20/2014   Sleep apnea    uses mouthpiece only   Thrombocytopenia 04/05/2012   TOBACCO ABUSE, HX OF 03/17/2010   Tubular adenoma of colon 06/10/2011   Past Surgical History:  Procedure Laterality Date   CARDIOVERSION N/A 08/11/2019   Procedure: CARDIOVERSION;  Surgeon: Delford Maude BROCKS, MD;  Location: [facility] ENDOSCOPY;  Service:  Cardiovascular;  Laterality: N/A;   CARDIOVERSION N/A 12/05/2021   Procedure: CARDIOVERSION;  Surgeon: Delford Maude BROCKS, MD;  Location: [facility] ENDOSCOPY;  Service: Cardiovascular;  Laterality: N/A;   CAROTID ENDARTERECTOMY Left February 13, 2013   cea   CATARACT EXTRACTION, BILATERAL Right    Cataract, and stigmatism   COLONOSCOPY  Dec. 2014   ENDARTERECTOMY Left 02/13/2013   Procedure: ENDARTERECTOMY CAROTID;  Surgeon: Krystal JULIANNA Doing, MD;  Location: [facility] OR;  Service: Vascular;  Laterality: Left;   EUS N/A 08/17/2013   Procedure: UPPER ENDOSCOPIC ULTRASOUND (EUS) LINEAR;  Surgeon: Toribio SHAUNNA Cedar, MD;  Location: [facility] ENDOSCOPY;  Service: Endoscopy;  Laterality: N/A;   knee cartliage repair  1964, 1990   bilateral   PILONIDAL CYST EXCISION  1968   removed   ROTATOR CUFF REPAIR     right   TONSILLECTOMY     UPPER GI ENDOSCOPY  Jan. 2015   Social History   Socioeconomic History   Marital status: Divorced    Spouse name: Not on file   Number of children: 3   Years of education: 36yrs   Highest education level: Bachelor's degree (e.g., BA, AB, BS)  Occupational History   Occupation: retired    Associate Professor: RETIRED  Tobacco Use   Smoking status: Former    Current packs/day: 0.00    Average packs/day: 2.0 packs/day for 20.0 years (40.0 ttl pk-yrs)    Types: Cigarettes    Start date: 07/19/1957    Quit date: 07/19/1977  Years since quitting: 46.9    Passive exposure: Never   Smokeless tobacco: Never   Tobacco comments:    Former smoker 12/12/21  Vaping Use   Vaping status: Never Used  Substance and Sexual Activity   Alcohol use: Yes    Alcohol/week: 10.0 standard drinks of alcohol    Types: 7 Glasses of wine, 2 Cans of beer, 1 Shots of liquor per week   Drug use: No   Sexual activity: Not Currently  Other Topics Concern   Not on file  Social History Narrative   Pt lives at home alone.    Caffeine Use: very little   Heart healthy diet, is planning exercise      Retired from public house manager   Left handed   Social Drivers of Health   Financial Resource Strain: Low Risk  (03/27/2024)   Overall Financial Resource Strain (CARDIA)    Difficulty of Paying Living Expenses: Not hard at all  Food Insecurity: No Food Insecurity (03/27/2024)   Hunger Vital Sign    Worried About Running Out of Food in the Last Year: Never true    Ran Out of Food in the Last Year: Never true  Transportation Needs: No Transportation Needs (03/27/2024)   PRAPARE - Administrator, Civil Service (Medical): No    Lack of Transportation (Non-Medical): No  Physical Activity: Insufficiently Active (03/27/2024)   Exercise Vital Sign    Days of Exercise per Week: 3 days    Minutes of Exercise per Session: 20 min  Stress: No Stress Concern Present (03/27/2024)   [employer] of Occupational Health - Occupational Stress Questionnaire    Feeling of Stress: Not at all  Social Connections: Moderately Integrated (03/27/2024)   Social Connection and Isolation Panel    Frequency of Communication with Friends and Family: More than three times a week    Frequency of Social Gatherings with Friends and Family: More than three times a week    Attends Religious Services: More than 4 times per year    Active Member of [employer] or Organizations: Yes    Attends Engineer, Structural: More than 4 times per year    Marital Status: Divorced   No Known Allergies Family History  Problem Relation Age of Onset   Alzheimer's disease Mother    Emphysema Sister        cigarettes   Cancer Sister        lung, tobacco    Heart disease Son    Coronary artery disease Brother    Heart attack Brother    Other Brother        heart problems- from fathers side   Cancer Brother 41       lung? smoker   Heart disease Brother 5       Heart disease before age 76   Cancer Sister        gyn   Heart disease Sister    Bipolar disorder Daughter    Obesity Daughter    Stroke Father    Heart disease Sister     Esophageal cancer Neg Hx    Colon cancer Neg Hx    Pancreatic cancer Neg Hx    Stomach cancer Neg Hx     Current Outpatient Medications (Endocrine & Metabolic):    levothyroxine  (SYNTHROID ) 88 MCG tablet, TAKE 1 TABLET BY MOUTH DAILY  BEFORE BREAKFAST  Current Outpatient Medications (Cardiovascular):    chlorthalidone  (HYGROTON ) 25 MG tablet, TAKE 1 TABLET  BY MOUTH IN THE  MORNING   metoprolol  succinate (TOPROL -XL) 25 MG 24 hr tablet, Take 1/2 tablet by mouth daily. May take extra 1/2 tablet daily for breakthrough afib   ramipril  (ALTACE ) 10 MG capsule, TAKE 1 CAPSULE BY MOUTH TWICE  DAILY   rosuvastatin  (CRESTOR ) 40 MG tablet, TAKE 1 TABLET BY MOUTH IN THE  EVENING   Current Outpatient Medications (Analgesics):    acetaminophen  (TYLENOL ) 500 MG tablet, Take 1,000 mg by mouth as needed for moderate pain or headache.    aspirin  EC 81 MG tablet, Take 81 mg by mouth every evening.   Current Outpatient Medications (Hematological):    vitamin B-12 (CYANOCOBALAMIN ) 1000 MCG tablet, Take 2,000 mcg by mouth every Monday, Wednesday, and Friday.   XARELTO  20 MG TABS tablet, TAKE 1 TABLET BY MOUTH DAILY  WITH SUPPER  Current Outpatient Medications (Other):    Cholecalciferol (VITAMIN D3) 50 MCG (2000 UT) TABS, Take 2,000 Units by mouth daily.   Multiple Vitamins-Minerals (MULTIVITAMIN WITH MINERALS) tablet, Take 1 tablet by mouth daily. Mega men 50+   pantoprazole  (PROTONIX ) 40 MG tablet, TAKE 1 TABLET BY MOUTH DAILY   Polyethyl Glycol-Propyl Glycol (SYSTANE ULTRA OP), Place 1 drop into both eyes in the morning.   TART CHERRY PO, Take 300 mg by mouth daily.   Tenapanor HCl (IBSRELA ) 50 MG TABS, Take 50 mg by mouth 2 (two) times daily.   Reviewed prior external information including notes and imaging from  primary care provider As well as notes that were available from care everywhere and other healthcare systems.  Past medical history, social, surgical and family history all reviewed in  electronic medical record.  No pertanent information unless stated regarding to the chief complaint.   Review of Systems:  No headache, visual changes, nausea, vomiting, diarrhea, constipation, dizziness, abdominal pain, skin rash, fevers, chills, night sweats, weight loss, swollen lymph nodes, body aches, joint swelling, chest pain, shortness of breath, mood changes. POSITIVE muscle aches  Objective  There were no vitals taken for this visit.   General: No apparent distress alert and oriented x3 mood and affect normal, dressed appropriately.  HEENT: Pupils equal, extraocular movements intact  Respiratory: Patient's speak in full sentences and does not appear short of breath  Cardiovascular: No lower extremity edema, non tender, no erythema      Impression and Recommendations:

## 2024-06-22 NOTE — Progress Notes (Signed)
 CARDIOLOGY CONSULT NOTE       Patient ID: Tanner [name] MRN: [id number] DOB/AGE: [birth year] 87 y.o.  Admit date: (Not on file) Referring Physician: Domenica Primary Physician: [name] LABOR, MD Primary Cardiologist: Delford    HPI:  89 y.o. referred by Dr Domenica for new onset afib in 07-12-2019 .  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 Vetigo once when he woke up from sleep earlier this month Primary has ordered MRI of head    ROS All other systems reviewed and negative except as noted above  Past Medical History:  Diagnosis Date   Allergy    seasonal- mostly spring   Anemia 02/03/2011   ASTHMA, CHILDHOOD 03/17/2010   mostly as child   BCC (basal cell carcinoma) 12/08/2011   CAROTID ARTERY OCCLUSION, WITH INFARCTION 03/17/2010   CEREBROVASCULAR ACCIDENT 03/17/2010   partial loss of peripheral vision on left-slight improved   CHICKENPOX, HX OF 03/17/2010   Constipation 12/08/2014   Epistaxis 09/06/2013   FATIGUE 03/17/2010   Hearing loss 11/04/2010    bilateral   HYPERKALEMIA 05/27/2010   Hyperlipidemia    Hypertension    Hypothyroid 12/08/2011   Mixed hyperlipidemia 03/17/2010   Oral lesion 11/20/2014   Overweight(278.02) 07/01/2010   Peripheral neuropathy 08/18/2016   Personal history of other infectious and parasitic disease 03/17/2010   Preventative health care 08/23/2016   Skin lesion of right arm 11/20/2014   Sleep apnea    uses mouthpiece only   Thrombocytopenia 04/05/2012   TOBACCO ABUSE, HX OF 03/17/2010   Tubular adenoma of colon 06/10/2011    Family History  Problem Relation Age of Onset   Alzheimer's disease Mother    Emphysema Sister        cigarettes   Cancer Sister        lung, tobacco    Heart disease Son    Coronary artery disease Brother    Heart attack Brother    Other Brother        heart problems- from fathers side   Cancer Brother 54       lung? smoker   Heart disease Brother 75       Heart disease before age 11   Cancer Sister        gyn   Heart disease Sister    Bipolar disorder Daughter    Obesity Daughter    Stroke Father    Heart disease Sister    Esophageal cancer  Neg Hx    Colon cancer Neg Hx    Pancreatic cancer Neg Hx    Stomach cancer Neg Hx     Social History   Socioeconomic History   Marital status: Divorced    Spouse name: Not on file   Number of children: 3   Years of education: 7yrs   Highest education level: Bachelor's degree (e.g., BA, AB, BS)  Occupational History   Occupation: retired    Associate Professor: RETIRED  Tobacco Use   Smoking status: Former    Current packs/day: 0.00    Average packs/day: 2.0 packs/day for 20.0 years (40.0 ttl pk-yrs)    Types: Cigarettes    Start date: 07/19/1957    Quit date: 07/19/1977    Years since quitting: 46.9    Passive exposure: Never   Smokeless tobacco: Never   Tobacco comments:    Former smoker 12/12/21  Vaping Use   Vaping status: Never Used  Substance and Sexual Activity   Alcohol use: Yes    Alcohol/week: 10.0 standard drinks of  alcohol    Types: 7 Glasses of wine, 2 Cans of beer, 1 Shots of liquor per week   Drug use: No   Sexual activity: Not Currently  Other Topics Concern   Not on file  Social History Narrative   Pt lives at home alone.    Caffeine Use: very little   Heart healthy diet, is planning exercise      Retired from patent examiner   Left handed   Social Drivers of Health   Tobacco Use: Medium Risk (06/30/2024)   Patient History    Smoking Tobacco Use: Former    Smokeless Tobacco Use: Never    Passive Exposure: Never  Physicist, Medical Strain: Low Risk (03/27/2024)   Overall Financial Resource Strain (CARDIA)    Difficulty of Paying Living Expenses: Not hard at all  Food Insecurity: No Food Insecurity (03/27/2024)   Epic    Worried About Programme Researcher, Broadcasting/film/video in the Last Year: Never true    Ran Out of Food in the Last Year: Never true  Transportation Needs: No Transportation Needs (03/27/2024)   Epic    Lack of Transportation (Medical): No    Lack of Transportation (Non-Medical): No  Physical Activity: Insufficiently Active (03/27/2024)   Exercise Vital Sign    Days of Exercise per Week: 3 days    Minutes of Exercise per Session: 20 min  Stress: No Stress Concern Present (03/27/2024)   [employer] of Occupational Health - Occupational Stress Questionnaire    Feeling of Stress: Not at all  Social Connections: Moderately Integrated (03/27/2024)   Social Connection and Isolation Panel    Frequency of Communication with Friends and Family: More than three times a week    Frequency of Social Gatherings with Friends and Family: More than three times a week    Attends Religious Services: More than 4 times per year    Active Member of Clubs or Organizations: Yes    Attends Banker Meetings: More than 4 times per year    Marital Status: Divorced  Intimate Partner Violence: Not At Risk (09/23/2023)   Humiliation, Afraid, Rape, and Kick questionnaire    Fear of Current or Ex-Partner: No     Emotionally Abused: No    Physically Abused: No    Sexually Abused: No  Depression (PHQ2-9): Low Risk (04/03/2024)   Depression (PHQ2-9)    PHQ-2 Score: 1  Alcohol Screen: Low Risk (03/27/2024)  Alcohol Screen    Last Alcohol Screening Score (AUDIT): 4  Housing: Low Risk (03/27/2024)   Epic    Unable to Pay for Housing in the Last Year: No    Number of Times Moved in the Last Year: 0    Homeless in the Last Year: No  Utilities: Not At Risk (09/23/2023)   AHC Utilities    Threatened with loss of utilities: No  Health Literacy: Adequate Health Literacy (09/23/2023)   B1300 Health Literacy    Frequency of need for help with medical instructions: Never    Past Surgical History:  Procedure Laterality Date   CARDIOVERSION N/A 08/11/2019   Procedure: CARDIOVERSION;  Surgeon: Delford Maude BROCKS, MD;  Location: [facility] ENDOSCOPY;  Service: Cardiovascular;  Laterality: N/A;   CARDIOVERSION N/A 12/05/2021   Procedure: CARDIOVERSION;  Surgeon: Delford Maude BROCKS, MD;  Location: [facility] ENDOSCOPY;  Service: Cardiovascular;  Laterality: N/A;   CAROTID ENDARTERECTOMY Left February 13, 2013   cea   CATARACT EXTRACTION, BILATERAL Right    Cataract, and stigmatism   COLONOSCOPY  Dec. 2014   ENDARTERECTOMY Left 02/13/2013   Procedure: ENDARTERECTOMY CAROTID;  Surgeon: Krystal JULIANNA Doing, MD;  Location: [facility] OR;  Service: Vascular;  Laterality: Left;   EUS N/A 08/17/2013   Procedure: UPPER ENDOSCOPIC ULTRASOUND (EUS) LINEAR;  Surgeon: Toribio SHAUNNA Cedar, MD;  Location: [facility] ENDOSCOPY;  Service: Endoscopy;  Laterality: N/A;   knee cartliage repair  1964, 1990   bilateral   PILONIDAL CYST EXCISION  1968   removed   ROTATOR CUFF REPAIR     right   TONSILLECTOMY     UPPER GI ENDOSCOPY  Jan. 2015        Physical Exam: Blood pressure 128/62, pulse 68, height 6' 1 (1.854 m), weight 211 lb 6.6 oz (95.9 kg), SpO2 100%.   Affect appropriate Healthy:  appears stated age HEENT: normal Neck supple with no adenopathy JVP normal left  CEA no bruits no thyromegaly Lungs clear with no wheezing and good diaphragmatic motion Heart:  S1/S2 AS murmur, no rub, gallop or click PMI normal Abdomen: benighn, BS positve, no tenderness, no AAA no bruit.  No HSM or HJR Distal pulses intact with no bruits No edema Neuro old left visual field defect  Skin warm and dry Left leg in boot    Labs:   Lab Results  Component Value Date   WBC 5.8 03/29/2024   HGB 13.4 03/29/2024   HCT 40.5 03/29/2024   MCV 96.1 03/29/2024   PLT 154.0 03/29/2024   No results for input(s): NA, K, CL, CO2, BUN, CREATININE, CALCIUM , PROT, BILITOT, ALKPHOS, ALT, AST, GLUCOSE in the last 168 hours.  Invalid input(s): LABALBU Lab Results  Component Value Date   CKTOTAL 95 03/10/2010   CKMB 3.6 03/10/2010   TROPONINI 0.01        NO INDICATION OF MYOCARDIAL INJURY. 03/10/2010    Lab Results  Component Value Date   CHOL 170 03/29/2024   CHOL 155 11/19/2023   CHOL 160 09/15/2023   Lab Results  Component Value Date   HDL 51.70 03/29/2024   HDL 46.70 11/19/2023   HDL 55.20 09/15/2023   Lab Results  Component Value Date   LDLCALC 97 03/29/2024   LDLCALC 87 11/19/2023   LDLCALC 88 09/15/2023   Lab Results  Component Value Date   TRIG 108.0 03/29/2024   TRIG 109.0 11/19/2023   TRIG 88.0 09/15/2023   Lab Results  Component Value Date   CHOLHDL 3 03/29/2024  CHOLHDL 3 11/19/2023   CHOLHDL 3 09/15/2023   No results found for: LDLDIRECT    Radiology: No results found.  EKG: SR rate 56 PR 208 msec 02/17/23    ASSESSMENT AND PLAN:   1. AFib:  NSR post DCC 08/11/19 and May 2023   continue Toprol  and Xarelto  CHAD Vasc 6  2. HTN:  Well controlled.  Continue current medications and low sodium Dash type diet.   3. AS/MR:  neither severe can update TTE  March 2026 4. Thyroid : continue current dose of synthroid  TSH normal  5. HDL  Continue high dose crestor  LDL at goal  6. CVA: carotids stable plavix  d/c due  to need for anticoagulation continue low dose ASA 81 mg Duplex 06/16/23 CTO RICA and plaque no stenosis on left   TTE March 2032for AS/MR Carotid duplex now known dx  F/U in a year   Signed: Maude Emmer 06/30/2024, 8:35 AM

## 2024-06-27 ENCOUNTER — Ambulatory Visit: Admitting: Family Medicine

## 2024-06-27 NOTE — Progress Notes (Unsigned)
 Tanner [name] Tanner [name] [address] [place] [address] Phone: [phone] Subjective:    I'm seeing this patient by the request  of:  [name] LABOR, MD  CC:   YEP:Dlagzrupcz  03/27/2024 Patient given injection in this area.  Discussed for the first 72 hours to be extremely careful with increasing activity.  We discussed with patient that hopefully this is a significant difference as well as help us  diagnostically.  Increase activity slowly.  Follow-up with me again in 2 to 3 months.  Worsening pain need to consider the possibility of lumbar radiculopathy and further workup with imaging.     Update 06/27/2024 Tanner [name] is a 89 y.o. male coming in with complaint of R leg pain. Patient states       Past Medical History:  Diagnosis Date   Allergy    seasonal- mostly spring   Anemia 02/03/2011   ASTHMA, CHILDHOOD 03/17/2010   mostly as child   BCC (basal cell carcinoma) 12/08/2011   CAROTID ARTERY OCCLUSION, WITH INFARCTION 03/17/2010   CEREBROVASCULAR ACCIDENT 03/17/2010   partial loss of peripheral vision on left-slight improved   CHICKENPOX, HX OF 03/17/2010   Constipation 12/08/2014   Epistaxis 09/06/2013   FATIGUE 03/17/2010   Hearing loss 11/04/2010   bilateral   HYPERKALEMIA 05/27/2010   Hyperlipidemia    Hypertension    Hypothyroid 12/08/2011   Mixed hyperlipidemia 03/17/2010   Oral lesion 11/20/2014   Overweight(278.02) 07/01/2010   Peripheral neuropathy 08/18/2016   Personal history of other infectious and parasitic disease 03/17/2010   Preventative health care 08/23/2016   Skin lesion of right arm 11/20/2014   Sleep apnea    uses mouthpiece only   Thrombocytopenia 04/05/2012   TOBACCO ABUSE, HX OF 03/17/2010   Tubular adenoma of colon 06/10/2011   Past Surgical History:  Procedure Laterality Date   CARDIOVERSION N/A 08/11/2019   Procedure: CARDIOVERSION;  Surgeon: Delford Maude BROCKS, MD;  Location: [facility] ENDOSCOPY;  Service: Cardiovascular;   Laterality: N/A;   CARDIOVERSION N/A 12/05/2021   Procedure: CARDIOVERSION;  Surgeon: Delford Maude BROCKS, MD;  Location: [facility] ENDOSCOPY;  Service: Cardiovascular;  Laterality: N/A;   CAROTID ENDARTERECTOMY Left February 13, 2013   cea   CATARACT EXTRACTION, BILATERAL Right    Cataract, and stigmatism   COLONOSCOPY  Dec. 2014   ENDARTERECTOMY Left 02/13/2013   Procedure: ENDARTERECTOMY CAROTID;  Surgeon: Krystal JULIANNA Doing, MD;  Location: [facility] OR;  Service: Vascular;  Laterality: Left;   EUS N/A 08/17/2013   Procedure: UPPER ENDOSCOPIC ULTRASOUND (EUS) LINEAR;  Surgeon: Toribio SHAUNNA Cedar, MD;  Location: [facility] ENDOSCOPY;  Service: Endoscopy;  Laterality: N/A;   knee cartliage repair  1964, 1990   bilateral   PILONIDAL CYST EXCISION  1968   removed   ROTATOR CUFF REPAIR     right   TONSILLECTOMY     UPPER GI ENDOSCOPY  Jan. 2015   Social History   Socioeconomic History   Marital status: Divorced    Spouse name: Not on file   Number of children: 3   Years of education: 28yrs   Highest education level: Bachelor's degree (e.g., BA, AB, BS)  Occupational History   Occupation: retired    Associate Professor: RETIRED  Tobacco Use   Smoking status: Former    Current packs/day: 0.00    Average packs/day: 2.0 packs/day for 20.0 years (40.0 ttl pk-yrs)    Types: Cigarettes    Start date: 07/19/1957    Quit date: 07/19/1977  Years since quitting: 46.9    Passive exposure: Never   Smokeless tobacco: Never   Tobacco comments:    Former smoker 12/12/21  Vaping Use   Vaping status: Never Used  Substance and Sexual Activity   Alcohol use: Yes    Alcohol/week: 10.0 standard drinks of alcohol    Types: 7 Glasses of wine, 2 Cans of beer, 1 Shots of liquor per week   Drug use: No   Sexual activity: Not Currently  Other Topics Concern   Not on file  Social History Narrative   Pt lives at home alone.    Caffeine Use: very little   Heart healthy diet, is planning exercise      Retired from patent examiner   Left  handed   Social Drivers of Health   Financial Resource Strain: Low Risk  (03/27/2024)   Overall Financial Resource Strain (CARDIA)    Difficulty of Paying Living Expenses: Not hard at all  Food Insecurity: No Food Insecurity (03/27/2024)   Hunger Vital Sign    Worried About Running Out of Food in the Last Year: Never true    Ran Out of Food in the Last Year: Never true  Transportation Needs: No Transportation Needs (03/27/2024)   PRAPARE - Administrator, Civil Service (Medical): No    Lack of Transportation (Non-Medical): No  Physical Activity: Insufficiently Active (03/27/2024)   Exercise Vital Sign    Days of Exercise per Week: 3 days    Minutes of Exercise per Session: 20 min  Stress: No Stress Concern Present (03/27/2024)   [employer] of Occupational Health - Occupational Stress Questionnaire    Feeling of Stress: Not at all  Social Connections: Moderately Integrated (03/27/2024)   Social Connection and Isolation Panel    Frequency of Communication with Friends and Family: More than three times a week    Frequency of Social Gatherings with Friends and Family: More than three times a week    Attends Religious Services: More than 4 times per year    Active Member of [employer] or Organizations: Yes    Attends Engineer, Structural: More than 4 times per year    Marital Status: Divorced   No Known Allergies Family History  Problem Relation Age of Onset   Alzheimer's disease Mother    Emphysema Sister        cigarettes   Cancer Sister        lung, tobacco    Heart disease Son    Coronary artery disease Brother    Heart attack Brother    Other Brother        heart problems- from fathers side   Cancer Brother 28       lung? smoker   Heart disease Brother 6       Heart disease before age 35   Cancer Sister        gyn   Heart disease Sister    Bipolar disorder Daughter    Obesity Daughter    Stroke Father    Heart disease Sister    Esophageal cancer Neg  Hx    Colon cancer Neg Hx    Pancreatic cancer Neg Hx    Stomach cancer Neg Hx     Current Outpatient Medications (Endocrine & Metabolic):    levothyroxine  (SYNTHROID ) 88 MCG tablet, TAKE 1 TABLET BY MOUTH DAILY  BEFORE BREAKFAST  Current Outpatient Medications (Cardiovascular):    chlorthalidone  (HYGROTON ) 25 MG tablet, TAKE 1 TABLET  BY MOUTH IN THE  MORNING   metoprolol  succinate (TOPROL -XL) 25 MG 24 hr tablet, Take 1/2 tablet by mouth daily. May take extra 1/2 tablet daily for breakthrough afib   ramipril  (ALTACE ) 10 MG capsule, TAKE 1 CAPSULE BY MOUTH TWICE  DAILY   rosuvastatin  (CRESTOR ) 40 MG tablet, TAKE 1 TABLET BY MOUTH IN THE  EVENING   Current Outpatient Medications (Analgesics):    acetaminophen  (TYLENOL ) 500 MG tablet, Take 1,000 mg by mouth as needed for moderate pain or headache.    aspirin  EC 81 MG tablet, Take 81 mg by mouth every evening.   Current Outpatient Medications (Hematological):    vitamin B-12 (CYANOCOBALAMIN ) 1000 MCG tablet, Take 2,000 mcg by mouth every Monday, Wednesday, and Friday.   XARELTO  20 MG TABS tablet, TAKE 1 TABLET BY MOUTH DAILY  WITH SUPPER  Current Outpatient Medications (Other):    Cholecalciferol (VITAMIN D3) 50 MCG (2000 UT) TABS, Take 2,000 Units by mouth daily.   Multiple Vitamins-Minerals (MULTIVITAMIN WITH MINERALS) tablet, Take 1 tablet by mouth daily. Mega men 50+   pantoprazole  (PROTONIX ) 40 MG tablet, TAKE 1 TABLET BY MOUTH DAILY   Polyethyl Glycol-Propyl Glycol (SYSTANE ULTRA OP), Place 1 drop into both eyes in the morning.   TART CHERRY PO, Take 300 mg by mouth daily.   Tenapanor HCl (IBSRELA ) 50 MG TABS, Take 50 mg by mouth 2 (two) times daily.   Reviewed prior external information including notes and imaging from  primary care provider As well as notes that were available from care everywhere and other healthcare systems.  Past medical history, social, surgical and family history all reviewed in electronic medical  record.  No pertanent information unless stated regarding to the chief complaint.   Review of Systems:  No headache, visual changes, nausea, vomiting, diarrhea, constipation, dizziness, abdominal pain, skin rash, fevers, chills, night sweats, weight loss, swollen lymph nodes, body aches, joint swelling, chest pain, shortness of breath, mood changes. POSITIVE muscle aches  Objective  There were no vitals taken for this visit.   General: No apparent distress alert and oriented x3 mood and affect normal, dressed appropriately.  HEENT: Pupils equal, extraocular movements intact  Respiratory: Patient's speak in full sentences and does not appear short of breath  Cardiovascular: No lower extremity edema, non tender, no erythema      Impression and Recommendations:

## 2024-06-28 ENCOUNTER — Ambulatory Visit

## 2024-06-28 ENCOUNTER — Other Ambulatory Visit: Payer: Self-pay

## 2024-06-28 ENCOUNTER — Ambulatory Visit: Admitting: Family Medicine

## 2024-06-28 ENCOUNTER — Encounter: Payer: Self-pay | Admitting: Family Medicine

## 2024-06-28 VITALS — BP 128/78 | HR 63 | Ht 73.0 in | Wt 210.0 lb

## 2024-06-28 DIAGNOSIS — M25571 Pain in right ankle and joints of right foot: Secondary | ICD-10-CM

## 2024-06-28 DIAGNOSIS — R42 Dizziness and giddiness: Secondary | ICD-10-CM

## 2024-06-28 DIAGNOSIS — S99911A Unspecified injury of right ankle, initial encounter: Secondary | ICD-10-CM | POA: Insufficient documentation

## 2024-06-28 NOTE — Assessment & Plan Note (Signed)
 Increased dizziness with extension of the neck.  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  With the extension concern for more of the posterior vascularity.  MR angiogram of the head and neck ordered.  Following up with cardiology this week as well as ordered for further evaluation of the carotid with ultrasound with [facility].  With patient's acute worsening symptoms  showed necessary with advanced imaging.  Patient given the red flags and when to seek medical attention otherwise.

## 2024-06-28 NOTE — Assessment & Plan Note (Signed)
 Significant swelling still noted in ecchymosis.  Patient is on a blood thinner.  Patient neurovascularly is intact and is able to ambulate.  On ultrasound appears medial malleolus nondisplaced fracture is noted.  Patient given a cam walker.  Was able to ambulate better in it, discussed icing, elevation, topical anti-inflammatories and Tylenol  use.  With patient already having some dizziness but would like to avoid narcotics.  Unable to do anti-inflammatories secondary to blood thinners.  Patient will follow-up again in 4 weeks.

## 2024-06-28 NOTE — Patient Instructions (Addendum)
 Mrangio cervical Head Cam walker for the right size 13  Arnica lotion 2 times a day to the bruising  Ice 20 minutes 2 times daily. Usually after activity and before bed. Elevate foot above hear when possible  .  Tylenol  3 times a day scheduled for 5 days.   Follow-up again in 4 weeks okay to double book

## 2024-06-30 ENCOUNTER — Ambulatory Visit: Attending: Cardiovascular Disease | Admitting: Cardiovascular Disease

## 2024-06-30 ENCOUNTER — Encounter: Payer: Self-pay | Admitting: Cardiovascular Disease

## 2024-06-30 VITALS — BP 128/62 | HR 68 | Ht 73.0 in | Wt 211.4 lb

## 2024-06-30 DIAGNOSIS — I6522 Occlusion and stenosis of left carotid artery: Secondary | ICD-10-CM | POA: Diagnosis not present

## 2024-06-30 DIAGNOSIS — I350 Nonrheumatic aortic (valve) stenosis: Secondary | ICD-10-CM

## 2024-06-30 DIAGNOSIS — I480 Paroxysmal atrial fibrillation: Secondary | ICD-10-CM | POA: Diagnosis not present

## 2024-06-30 NOTE — Patient Instructions (Signed)
 Medication Instructions:  Your physician recommends that you continue on your current medications as directed. Please refer to the Current Medication list given to you today.  *If you need a refill on your cardiac medications before your next appointment, please call your pharmacy*  Lab Work: none If you have labs (blood work) drawn today and your tests are completely normal, you will receive your results only by: MyChart Message (if you have MyChart) OR A paper copy in the mail If you have any lab test that is abnormal or we need to change your treatment, we will call you to review the results.  Testing/Procedures: Echo  Your physician has requested that you have an echocardiogram. Echocardiography is a painless test that uses sound waves to create images of your heart. It provides your doctor with information about the size and shape of your heart and how well your hearts chambers and valves are working. This procedure takes approximately one hour. There are no restrictions for this procedure. Please do NOT wear cologne, perfume, aftershave, or lotions (deodorant is allowed). Please arrive 15 minutes prior to your appointment time.  Please note: We ask at that you not bring children with you during ultrasound (echo/ vascular) testing. Due to room size and safety concerns, children are not allowed in the ultrasound rooms during exams. Our front office staff cannot provide observation of children in our lobby area while testing is being conducted. An adult accompanying a patient to their appointment will only be allowed in the ultrasound room at the discretion of the ultrasound technician under special circumstances. We apologize for any inconvenience.   Follow-Up: At [facility], you and your health needs are our priority.  As part of our continuing mission to provide you with exceptional heart care, our providers are all part of one team.  This team includes your primary  Cardiologist (physician) and Advanced Practice Providers or APPs (Physician Assistants and Nurse Practitioners) who all work together to provide you with the care you need, when you need it.  Your next appointment:   1 year  Provider:   Dr. Delford  We recommend signing up for the patient portal called MyChart.  Sign up information is provided on this After Visit Summary.  MyChart is used to connect with patients for Virtual Visits (Telemedicine).  Patients are able to view lab/test results, encounter notes, upcoming appointments, etc.  Non-urgent messages can be sent to your provider as well.   To learn more about what you can do with MyChart, go to [email or web address].   Other Instructions Carotid duplex

## 2024-07-04 ENCOUNTER — Ambulatory Visit: Payer: Medicare Other | Admitting: Neurology

## 2024-07-19 ENCOUNTER — Ambulatory Visit
Admission: RE | Admit: 2024-07-19 | Discharge: 2024-07-19 | Disposition: A | Discharge status: Home / Self Care | Source: Ambulatory Visit | Attending: Family Medicine | Admitting: Family Medicine

## 2024-07-19 DIAGNOSIS — R42 Dizziness and giddiness: Secondary | ICD-10-CM

## 2024-07-19 MED ORDER — GADOPICLENOL 0.5 MMOL/ML IV SOLN
10.0000 mL | Freq: Once | INTRAVENOUS | Status: AC | PRN
  Administered 2024-07-19: 10 mL via INTRAVENOUS

## 2024-07-19 NOTE — Progress Notes (Signed)
 " Tanner [name] [address] [place] [address] Phone: [phone] Subjective:   ISusannah [name], am serving as a scribe for Dr. Arthea Claudene.  I'm seeing this patient by the request  of:  [name] LABOR, MD  CC: Right ankle pain  YEP:Dlagzrupcz  04/28/2024 Significant swelling still noted in ecchymosis. Patient is on a blood thinner. Patient neurovascularly is intact and is able to ambulate. On ultrasound appears medial malleolus nondisplaced fracture is noted. Patient given a cam walker. Was able to ambulate better in it, discussed icing, elevation, topical anti-inflammatories and Tylenol  use. With patient already having some dizziness but would like to avoid narcotics. Unable to do anti-inflammatories secondary to blood thinners. Patient will follow-up again in 4 weeks.   Increased dizziness with extension of the neck.  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  With the extension concern for more of the posterior vascularity.  MR angiogram of the head and neck ordered.  Following up with cardiology this week as well as ordered for further evaluation of the carotid with ultrasound with [facility].  With patient's acute worsening symptoms  showed necessary with advanced imaging.  Patient given the red flags and when to seek medical attention otherwise.     Update 07/26/2023 ZIARE [name] is a 89 y.o. male coming in with complaint of R ankle pain.  Was concern with a nondisplaced medial malleolus fracture.  MRA head on 07/19/2024. Patient states talk about MRI. Doing better. Slight swelling during the day.      Past Medical History:  Diagnosis Date   Allergy    seasonal- mostly spring   Anemia 02/03/2011   ASTHMA, CHILDHOOD 03/17/2010   mostly as child   BCC (basal cell carcinoma) 12/08/2011   CAROTID ARTERY OCCLUSION, WITH INFARCTION 03/17/2010   CEREBROVASCULAR ACCIDENT 03/17/2010   partial loss of peripheral vision  on left-slight improved   CHICKENPOX, HX OF 03/17/2010   Constipation 12/08/2014   Epistaxis 09/06/2013   FATIGUE 03/17/2010   Hearing loss 11/04/2010   bilateral   HYPERKALEMIA 05/27/2010   Hyperlipidemia    Hypertension    Hypothyroid 12/08/2011   Mixed hyperlipidemia 03/17/2010   Oral lesion 11/20/2014   Overweight(278.02) 07/01/2010   Peripheral neuropathy 08/18/2016   Personal history of other infectious and parasitic disease 03/17/2010   Preventative health care 08/23/2016   Skin lesion of right arm 11/20/2014   Sleep apnea    uses mouthpiece only   Thrombocytopenia 04/05/2012   TOBACCO ABUSE, HX OF 03/17/2010   Tubular adenoma of colon 06/10/2011   Past Surgical History:  Procedure Laterality Date   CARDIOVERSION N/A 08/11/2019   Procedure: CARDIOVERSION;  Surgeon: Delford Maude BROCKS, MD;  Location: [facility] ENDOSCOPY;  Service: Cardiovascular;  Laterality: N/A;   CARDIOVERSION N/A 12/05/2021   Procedure: CARDIOVERSION;  Surgeon: Delford Maude BROCKS, MD;  Location: [facility] ENDOSCOPY;  Service: Cardiovascular;  Laterality: N/A;   CAROTID ENDARTERECTOMY Left February 13, 2013   cea   CATARACT EXTRACTION, BILATERAL Right    Cataract, and stigmatism   COLONOSCOPY  Dec. 2014   ENDARTERECTOMY Left 02/13/2013   Procedure: ENDARTERECTOMY CAROTID;  Surgeon: Krystal JULIANNA Doing, MD;  Location: [facility] OR;  Service: Vascular;  Laterality: Left;   EUS N/A 08/17/2013   Procedure: UPPER ENDOSCOPIC ULTRASOUND (EUS) LINEAR;  Surgeon: Toribio SHAUNNA Cedar, MD;  Location: [facility] ENDOSCOPY;  Service: Endoscopy;  Laterality: N/A;   knee cartliage repair  1964, 1990  bilateral   PILONIDAL CYST EXCISION  1968   removed   ROTATOR CUFF REPAIR     right   TONSILLECTOMY     UPPER GI ENDOSCOPY  Jan. 2015   Social History   Socioeconomic History   Marital status: Divorced    Spouse name: Not on file   Number of children: 3   Years of education: 65yrs   Highest education level: Bachelor's degree (e.g., BA, AB, BS)  Occupational History    Occupation: retired    Associate Professor: RETIRED  Tobacco Use   Smoking status: Former    Current packs/day: 0.00    Average packs/day: 2.0 packs/day for 20.0 years (40.0 ttl pk-yrs)    Types: Cigarettes    Start date: 07/19/1957    Quit date: 07/19/1977    Years since quitting: 47.0    Passive exposure: Never   Smokeless tobacco: Never   Tobacco comments:    Former smoker 12/12/21  Vaping Use   Vaping status: Never Used  Substance and Sexual Activity   Alcohol use: Yes    Alcohol/week: 10.0 standard drinks of alcohol    Types: 7 Glasses of wine, 2 Cans of beer, 1 Shots of liquor per week   Drug use: No   Sexual activity: Not Currently  Other Topics Concern   Not on file  Social History Narrative   Pt lives at home alone.    Caffeine Use: very little   Heart healthy diet, is planning exercise      Retired from patent examiner   Left handed   Social Drivers of Health   Tobacco Use: Medium Risk (06/30/2024)   Patient History    Smoking Tobacco Use: Former    Smokeless Tobacco Use: Never    Passive Exposure: Never  Physicist, Medical Strain: Low Risk (03/27/2024)   Overall Financial Resource Strain (CARDIA)    Difficulty of Paying Living Expenses: Not hard at all  Food Insecurity: No Food Insecurity (03/27/2024)   Epic    Worried About Programme Researcher, Broadcasting/film/video in the Last Year: Never true    Ran Out of Food in the Last Year: Never true  Transportation Needs: No Transportation Needs (03/27/2024)   Epic    Lack of Transportation (Medical): No    Lack of Transportation (Non-Medical): No  Physical Activity: Insufficiently Active (03/27/2024)   Exercise Vital Sign    Days of Exercise per Week: 3 days    Minutes of Exercise per Session: 20 min  Stress: No Stress Concern Present (03/27/2024)   [employer] of Occupational Health - Occupational Stress Questionnaire    Feeling of Stress: Not at all  Social Connections: Moderately Integrated (03/27/2024)   Social Connection and Isolation  Panel    Frequency of Communication with Friends and Family: More than three times a week    Frequency of Social Gatherings with Friends and Family: More than three times a week    Attends Religious Services: More than 4 times per year    Active Member of Clubs or Organizations: Yes    Attends Banker Meetings: More than 4 times per year    Marital Status: Divorced  Depression (PHQ2-9): Low Risk (04/03/2024)   Depression (PHQ2-9)    PHQ-2 Score: 1  Alcohol Screen: Low Risk (03/27/2024)   Alcohol Screen    Last Alcohol Screening Score (AUDIT): 4  Housing: Low Risk (03/27/2024)   Epic    Unable to Pay for Housing in the Last Year: No  Number of Times Moved in the Last Year: 0    Homeless in the Last Year: No  Utilities: Not At Risk (09/23/2023)   AHC Utilities    Threatened with loss of utilities: No  Health Literacy: Adequate Health Literacy (09/23/2023)   B1300 Health Literacy    Frequency of need for help with medical instructions: Never   Allergies[1] Family History  Problem Relation Age of Onset   Alzheimer's disease Mother    Emphysema Sister        cigarettes   Cancer Sister        lung, tobacco    Heart disease Son    Coronary artery disease Brother    Heart attack Brother    Other Brother        heart problems- from fathers side   Cancer Brother 48       lung? smoker   Heart disease Brother 68       Heart disease before age 66   Cancer Sister        gyn   Heart disease Sister    Bipolar disorder Daughter    Obesity Daughter    Stroke Father    Heart disease Sister    Esophageal cancer Neg Hx    Colon cancer Neg Hx    Pancreatic cancer Neg Hx    Stomach cancer Neg Hx     Current Outpatient Medications (Endocrine & Metabolic):    levothyroxine  (SYNTHROID ) 88 MCG tablet, TAKE 1 TABLET BY MOUTH DAILY  BEFORE BREAKFAST  Current Outpatient Medications (Cardiovascular):    chlorthalidone  (HYGROTON ) 25 MG tablet, TAKE 1 TABLET BY MOUTH IN THE  MORNING    metoprolol  succinate (TOPROL -XL) 25 MG 24 hr tablet, Take 1/2 tablet by mouth daily. May take extra 1/2 tablet daily for breakthrough afib   ramipril  (ALTACE ) 10 MG capsule, TAKE 1 CAPSULE BY MOUTH TWICE  DAILY   rosuvastatin  (CRESTOR ) 40 MG tablet, TAKE 1 TABLET BY MOUTH IN THE  EVENING  Current Outpatient Medications (Analgesics):    acetaminophen  (TYLENOL ) 500 MG tablet, Take 1,000 mg by mouth as needed for moderate pain or headache.    aspirin  EC 81 MG tablet, Take 81 mg by mouth every evening.   Current Outpatient Medications (Hematological):    vitamin B-12 (CYANOCOBALAMIN ) 1000 MCG tablet, Take 2,000 mcg by mouth every Monday, Wednesday, and Friday.   XARELTO  20 MG TABS tablet, TAKE 1 TABLET BY MOUTH DAILY  WITH SUPPER  Current Outpatient Medications (Other):    Cholecalciferol (VITAMIN D3) 50 MCG (2000 UT) TABS, Take 2,000 Units by mouth daily.   Multiple Vitamins-Minerals (MULTIVITAMIN WITH MINERALS) tablet, Take 1 tablet by mouth daily. Mega men 50+   pantoprazole  (PROTONIX ) 40 MG tablet, TAKE 1 TABLET BY MOUTH DAILY   Polyethyl Glycol-Propyl Glycol (SYSTANE ULTRA OP), Place 1 drop into both eyes in the morning.   TART CHERRY PO, Take 300 mg by mouth daily.   Tenapanor HCl (IBSRELA ) 50 MG TABS, Take 50 mg by mouth 2 (two) times daily.   Reviewed prior external information including notes and imaging from  primary care provider As well as notes that were available from care everywhere and other healthcare systems.  Past medical history, social, surgical and family history all reviewed in electronic medical record.  No pertanent information unless stated regarding to the chief complaint.   Review of Systems:  No  visual changes, nausea, vomiting, diarrhea, constipation,  abdominal pain, skin rash, fevers, chills, night sweats, weight loss,  swollen lymph nodes, body aches, joint swelling, chest pain, shortness of breath, mood changes. POSITIVE muscle aches, dizziness,  headaches  Objective  Blood pressure 122/76, pulse 64, height 6' 1 (1.854 m), weight 207 lb (93.9 kg), SpO2 97%.   General: No apparent distress alert and oriented x3 mood and affect normal, dressed appropriately.  HEENT: Pupils equal, extraocular movements intact  Respiratory: Patient's speak in full sentences and does not appear short of breath  Cardiovascular: No lower extremity edema, non tender, no erythema  Ankle exam shows arthritic changes noted.  Patient does have some tenderness to palpation over the medial malleolus.  Improvement in range of motion of the ankle.  Significant decrease in the swelling that was previously still noted..  Limited muscular skeletal ultrasound was performed and interpreted by CLAUDENE HUSSAR, M  Patient still has some what appears to be a hematoma still noted to on the anterior aspect of the ankle just medial to midline. Patient's medial malleolus though does have a callus formation noted.  Seems to be a full callus noted.   Impression and Recommendations:    Right ankle injury, initial encounter Significant improvement at this time.  Discussed with patient that I do see callus formation over the medial malleolus.  Patient does still have some coagulation in the soft tissue and encouraged him to do more massage therapy.  Increase the mobilization.  Follow-up with me again in 6 to 12 weeks.  Mild atherosclerosis of carotid artery Patient has been monitored but is now symptomatic with dizziness.  I am concerned with patient having a history of a CVA/TIA, now with angiogram showing the 100% blockage on the right side of the carotid and a abnormality noted at the bifurcation of the internal and external carotid.  Patient is following up with vascular and cardiology in the near future and is scheduled for his Doppler as well as echocardiogram in the next week or 2.  Patient knows if worsening symptoms to seek medical attention and go to the emergency room  immediately.   The above documentation has been reviewed and is accurate and complete Liya Strollo M Khani Paino, DO       [1] No Known Allergies  "

## 2024-07-25 ENCOUNTER — Other Ambulatory Visit: Payer: Self-pay

## 2024-07-25 ENCOUNTER — Ambulatory Visit: Admitting: Family Medicine

## 2024-07-25 ENCOUNTER — Telehealth: Payer: Self-pay | Admitting: Family Medicine

## 2024-07-25 ENCOUNTER — Encounter: Payer: Self-pay | Admitting: Family Medicine

## 2024-07-25 VITALS — BP 122/76 | HR 64 | Ht 73.0 in | Wt 207.0 lb

## 2024-07-25 DIAGNOSIS — Z8673 Personal history of transient ischemic attack (TIA), and cerebral infarction without residual deficits: Secondary | ICD-10-CM

## 2024-07-25 DIAGNOSIS — S99911A Unspecified injury of right ankle, initial encounter: Secondary | ICD-10-CM | POA: Diagnosis not present

## 2024-07-25 DIAGNOSIS — I6529 Occlusion and stenosis of unspecified carotid artery: Secondary | ICD-10-CM

## 2024-07-25 DIAGNOSIS — M25571 Pain in right ankle and joints of right foot: Secondary | ICD-10-CM

## 2024-07-25 NOTE — Assessment & Plan Note (Signed)
 Significant improvement at this time.  Discussed with patient that I do see callus formation over the medial malleolus.  Patient does still have some coagulation in the soft tissue and encouraged him to do more massage therapy.  Increase the mobilization.  Follow-up with me again in 6 to 12 weeks.

## 2024-07-25 NOTE — Telephone Encounter (Signed)
 Called pt and he stated due to his insurance changes he will start needing referrals to his specialty provider as of 07/20/2024. He currently see Arthea Claudene Has at [facility] sports medicine at [facility]; Vascular & Vein at [facility]; Juliene Skeet HAS at [facility] and would like to stay with all of the providers just would like the referral placed.

## 2024-07-25 NOTE — Assessment & Plan Note (Signed)
 Patient has been monitored but is now symptomatic with dizziness.  I am concerned with patient having a history of a CVA/TIA, now with angiogram showing the 100% blockage on the right side of the carotid and a abnormality noted at the bifurcation of the internal and external carotid.  Patient is following up with vascular and cardiology in the near future and is scheduled for his Doppler as well as echocardiogram in the next week or 2.  Patient knows if worsening symptoms to seek medical attention and go to the emergency room immediately.

## 2024-07-25 NOTE — Telephone Encounter (Signed)
 Pt dropped a list of appointments he has in the future and states he needs pcp to approve the appointments for insurance. List was left in pcps box. Please contact pt when this is done.

## 2024-07-25 NOTE — Patient Instructions (Signed)
 Massage and mobilize fluid Will make sure vascular and cardio knows everything See me in 2 months

## 2024-07-27 NOTE — Addendum Note (Signed)
 Addended by: Emon Lance D on: 07/27/2024 12:01 PM   Modules accepted: Orders

## 2024-07-27 NOTE — Telephone Encounter (Signed)
 Referrals placed

## 2024-07-31 ENCOUNTER — Ambulatory Visit: Payer: Self-pay | Admitting: Family Medicine

## 2024-08-02 NOTE — Progress Notes (Unsigned)
 "  Patient ID: Tanner [name], male   DOB: February 04, 1935, 89 y.o.   MRN: [id number]  Reason for Consult: No chief complaint on file.   Referred by [name] LABOR, MD  Subjective:     HPI Tanner [name] is a 89 y.o. male presenting for follow-up.  He underwent left carotid endarterectomy by Dr. Oris in 2014.  He was last seen by our office in November 2024.  He has been having some issues with dizziness but denies any stroke or strokelike symptoms.  Specifically denies any one-sided weakness, numbness, amaurosis or speech issues.  He is compliant with aspirin  and Crestor .  He quit smoking many years ago.  Past Medical History:  Diagnosis Date   Allergy    seasonal- mostly spring   Anemia 02/03/2011   ASTHMA, CHILDHOOD 03/17/2010   mostly as child   BCC (basal cell carcinoma) 12/08/2011   CAROTID ARTERY OCCLUSION, WITH INFARCTION 03/17/2010   CEREBROVASCULAR ACCIDENT 03/17/2010   partial loss of peripheral vision on left-slight improved   CHICKENPOX, HX OF 03/17/2010   Constipation 12/08/2014   Epistaxis 09/06/2013   FATIGUE 03/17/2010   Hearing loss 11/04/2010   bilateral   HYPERKALEMIA 05/27/2010   Hyperlipidemia    Hypertension    Hypothyroid 12/08/2011   Mixed hyperlipidemia 03/17/2010   Oral lesion 11/20/2014   Overweight(278.02) 07/01/2010   Peripheral neuropathy 08/18/2016   Personal history of other infectious and parasitic disease 03/17/2010   Preventative health care 08/23/2016   Skin lesion of right arm 11/20/2014   Sleep apnea    uses mouthpiece only   Thrombocytopenia 04/05/2012   TOBACCO ABUSE, HX OF 03/17/2010   Tubular adenoma of colon 06/10/2011   Family History  Problem Relation Age of Onset   Alzheimer's disease Mother    Emphysema Sister        cigarettes   Cancer Sister        lung, tobacco    Heart disease Son    Coronary artery disease Brother    Heart attack Brother    Other Brother        heart problems- from fathers side   Cancer Brother 95        lung? smoker   Heart disease Brother 55       Heart disease before age 88   Cancer Sister        gyn   Heart disease Sister    Bipolar disorder Daughter    Obesity Daughter    Stroke Father    Heart disease Sister    Esophageal cancer Neg Hx    Colon cancer Neg Hx    Pancreatic cancer Neg Hx    Stomach cancer Neg Hx    Past Surgical History:  Procedure Laterality Date   CARDIOVERSION N/A 08/11/2019   Procedure: CARDIOVERSION;  Surgeon: Delford Maude BROCKS, MD;  Location: [facility] ENDOSCOPY;  Service: Cardiovascular;  Laterality: N/A;   CARDIOVERSION N/A 12/05/2021   Procedure: CARDIOVERSION;  Surgeon: Delford Maude BROCKS, MD;  Location: [facility] ENDOSCOPY;  Service: Cardiovascular;  Laterality: N/A;   CAROTID ENDARTERECTOMY Left February 13, 2013   cea   CATARACT EXTRACTION, BILATERAL Right    Cataract, and stigmatism   COLONOSCOPY  Dec. 2014   ENDARTERECTOMY Left 02/13/2013   Procedure: ENDARTERECTOMY CAROTID;  Surgeon: Krystal JULIANNA Oris, MD;  Location: [facility] OR;  Service: Vascular;  Laterality: Left;   EUS N/A 08/17/2013   Procedure: UPPER ENDOSCOPIC ULTRASOUND (EUS) LINEAR;  Surgeon: Toribio SHAUNNA Cedar, MD;  Location: [facility] ENDOSCOPY;  Service: Endoscopy;  Laterality: N/A;   knee cartliage repair  1964, 1990   bilateral   PILONIDAL CYST EXCISION  1968   removed   ROTATOR CUFF REPAIR     right   TONSILLECTOMY     UPPER GI ENDOSCOPY  Jan. 2015    Short Social History:  Social History   Tobacco Use   Smoking status: Former    Current packs/day: 0.00    Average packs/day: 2.0 packs/day for 20.0 years (40.0 ttl pk-yrs)    Types: Cigarettes    Start date: 07/19/1957    Quit date: 07/19/1977    Years since quitting: 47.0    Passive exposure: Never   Smokeless tobacco: Never   Tobacco comments:    Former smoker 12/12/21  Substance Use Topics   Alcohol use: Yes    Alcohol/week: 10.0 standard drinks of alcohol    Types: 7 Glasses of wine, 2 Cans of beer, 1 Shots of liquor per week     Allergies[1]  Current Outpatient Medications  Medication Sig Dispense Refill   acetaminophen  (TYLENOL ) 500 MG tablet Take 1,000 mg by mouth as needed for moderate pain or headache.      aspirin  EC 81 MG tablet Take 81 mg by mouth every evening.      chlorthalidone  (HYGROTON ) 25 MG tablet TAKE 1 TABLET BY MOUTH IN THE  MORNING 90 tablet 3   Cholecalciferol (VITAMIN D3) 50 MCG (2000 UT) TABS Take 2,000 Units by mouth daily.     levothyroxine  (SYNTHROID ) 88 MCG tablet TAKE 1 TABLET BY MOUTH DAILY  BEFORE BREAKFAST 90 tablet 3   metoprolol  succinate (TOPROL -XL) 25 MG 24 hr tablet Take 1/2 tablet by mouth daily. May take extra 1/2 tablet daily for breakthrough afib 180 tablet 1   Multiple Vitamins-Minerals (MULTIVITAMIN WITH MINERALS) tablet Take 1 tablet by mouth daily. Mega men 50+     pantoprazole  (PROTONIX ) 40 MG tablet TAKE 1 TABLET BY MOUTH DAILY 90 tablet 3   Polyethyl Glycol-Propyl Glycol (SYSTANE ULTRA OP) Place 1 drop into both eyes in the morning.     ramipril  (ALTACE ) 10 MG capsule TAKE 1 CAPSULE BY MOUTH TWICE  DAILY 180 capsule 3   rosuvastatin  (CRESTOR ) 40 MG tablet TAKE 1 TABLET BY MOUTH IN THE  EVENING 90 tablet 3   TART CHERRY PO Take 300 mg by mouth daily.     Tenapanor HCl (IBSRELA ) 50 MG TABS Take 50 mg by mouth 2 (two) times daily. 180 tablet 3   vitamin B-12 (CYANOCOBALAMIN ) 1000 MCG tablet Take 2,000 mcg by mouth every Monday, Wednesday, and Friday.     XARELTO  20 MG TABS tablet TAKE 1 TABLET BY MOUTH DAILY  WITH SUPPER 90 tablet 1   No current facility-administered medications for this visit.    REVIEW OF SYSTEMS  All other systems were reviewed and are negative     Objective:  Objective   There were no vitals filed for this visit. There is no height or weight on file to calculate BMI.  Physical Exam General: no acute distress Cardiac: hemodynamically stable Abdomen: non-tender, no pulsatile mass*** Extremities: no edema, cyanosis or wounds*** Vascular:    Right: ***  Left: ***  Data: Carotid duplex ***  CMP reviewed, creatinine 1.25  Lipid panel reviewed     Assessment/Plan:   Tanner [name] is a 89 y.o. male with carotid stenosis who underwent CEA with Dr. Oris in 2014.  I explained that his symptoms of  dizziness and lightheadedness are not related to carotid disease.  His left endarterectomy site is widely patent.  He has a chronic occlusion of the right carotid.  Plan to continue with best medical management and annual surveillance.  It does not appear that he has had AAA screening.  Will also obtain a AAA duplex at his next follow-up in 12 months.  Plan for follow-up in 12 months with repeat carotid duplex and AAA screening at that time.  Recommendations to optimize cardiovascular risk: Abstinence from all tobacco products. Blood glucose control with goal A1c < 7%. Blood pressure control with goal blood pressure < 140/90 mmHg. Lipid reduction therapy with goal LDL-C <55 mg/dL  Aspirin  81mg  PO QD.  Atorvastatin  40-80mg  PO QD (or other high intensity statin therapy).   Norman GORMAN Serve MD [facility]     [1] No Known Allergies  "

## 2024-08-04 ENCOUNTER — Encounter: Payer: Self-pay | Admitting: Vascular Surgery

## 2024-08-04 ENCOUNTER — Ambulatory Visit: Admitting: Vascular Surgery

## 2024-08-04 ENCOUNTER — Ambulatory Visit: Payer: Self-pay | Admitting: Cardiovascular Disease

## 2024-08-04 ENCOUNTER — Ambulatory Visit (HOSPITAL_COMMUNITY)
Admission: RE | Admit: 2024-08-04 | Discharge: 2024-08-04 | Disposition: A | Discharge status: Home / Self Care | Source: Ambulatory Visit | Attending: Vascular Surgery | Admitting: Vascular Surgery

## 2024-08-04 VITALS — BP 144/74 | HR 60 | Temp 97.9°F | Resp 18 | Ht 73.0 in | Wt 211.0 lb

## 2024-08-04 DIAGNOSIS — I6522 Occlusion and stenosis of left carotid artery: Secondary | ICD-10-CM

## 2024-08-04 DIAGNOSIS — I480 Paroxysmal atrial fibrillation: Secondary | ICD-10-CM | POA: Diagnosis present

## 2024-08-07 ENCOUNTER — Ambulatory Visit (HOSPITAL_COMMUNITY)
Admission: RE | Admit: 2024-08-07 | Discharge: 2024-08-07 | Disposition: A | Discharge status: Home / Self Care | Source: Ambulatory Visit | Attending: Internal Medicine | Admitting: Internal Medicine

## 2024-08-07 DIAGNOSIS — I350 Nonrheumatic aortic (valve) stenosis: Secondary | ICD-10-CM | POA: Insufficient documentation

## 2024-08-07 LAB — ECHOCARDIOGRAM COMPLETE
AR max vel: 1.68 cm2
AV Area VTI: 1.56 cm2
AV Area mean vel: 1.6 cm2
AV Mean grad: 11.5 mmHg
AV Peak grad: 21.3 mmHg
Ao pk vel: 2.31 m/s
Area-P 1/2: 2.79 cm2
S' Lateral: 3 cm

## 2024-08-12 ENCOUNTER — Encounter: Payer: Self-pay | Admitting: Neurology

## 2024-08-15 NOTE — Progress Notes (Unsigned)
 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  Secondary stroke prevention as managed by PCA and cardiology: - ASA and Xarelto  - Statin.  LDL goal less than 70 - Normotensive blood pressure - follow up with PCP - Hgb A1c goal less than 70 3. Follow up one year.  Subjective:  Tanner [name] is an 89 year old left-handed male with atrial fibrillation, hypertension, hyperlipidemia, hypothyroidism, OSA and former smoker who follows up for right MCA/PCA watershed infarct, carotid artery disease with right ICA occlusion and s/p left CEA, and peripheral neuropathy.  MRA personally reviewed.   UPDATE: Current medications:  Xarelto , ASA 81mg , Crestor  40mg , Toprol  XL  He has been feeling dizzy for 1 or 2 months ago.  It is not a lightheadedness.  One morning, he sat up in bed and fell backwards.  If he is sitting and relaxing, he has to get up slow or he will feel like he will fall.  It occurs for a couple of seconds but had to be cautious when he stands.  If he is lying supine, he may feel spinning for a few seconds.  When he was lying down for the MRA, he started experiencing vertigo.  He readjusted and it resolved.  The dizziness is daily with no improvement.  Unchanged.  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  Annual carotid ultrasound on 08/04/2024 again demonstrated total occlusion of the right ICA but no evidence of left ICA stenosis.  Bilateral vertebral arteries sowed antegrade flow.  2D echo  on 08/07/2024 showed EF 60-65%, severely dilated right atrium,  mild MVR, mild-moderate AS.    He uses a CPAP and it is very around his neck.  It is uncomfortable.  Neck feels uncomfortable in the mornings and improves as the day progresses.  However, he hasn't used the CPAP since onset of symptoms.     Neuropathy is unchanged. Not using gabapentin   06/08/2023 LABS:  Hgb A1c 5.8, LDL 87 06/16/2023 CAROTID U/S:  Right ICA occlusion with recannalized flow noted in segments of the proximal CCA and mid CCA.  1-39% stenosis of left ICA.  Bilateral vertebral arteries with antegrade flow.   HISTORY: Stroke:   In August 2011, he had a stroke, presenting as left visual field cut.  He had a right hemispheric stroke thought to be secondary to right ICA occlusion.  MRI of brain revealed an acute watershed infarct in the right PCA/MCA distributions.  CTA of the neck confirmed right common carotid artery occlusion at the origin.  The left ICA revealed approximately 50% stenosis just distal to the bulb.  CTA of the head revealed occluded right ICA with partial retrograde constitution from the left INCA via the PCA.  There was opacification of the right MCA and right ACA from the left ICA via the [employer].  There was occlusion of the right PCA at its origin.  Hgb A1c at that time was 5.6 and LDL was 210.   He was placed on Plavix .  Follow up carotid dopplers revealed increased stenosis of the left ICA.  Carotid  doppler from 01/03/13 revealed more than 70% stenosis involving the left bulb and left proximal ICA and 50-69% stenosis involving the left mid ICA, but without plaque.  Stenosis of left ECA and left VA noted.  CTA of the neck performed on 01/19/13 revealed 75-80% stenosis of the left ICA.  He underwent a left CEA on 02/13/13.     02/07/13 2D Echo:  LVEF 55-65%, no regional wall motion abnormalities, mild MVR, mildly dilated left atrium, mildly dilated right ventricle, mildly increased systolic pressure in pulCarotid  doppler on 04/02/15 showed right ICA occluded.  Left ICA patent.  No plaque He followed up with vascular surgery on 04/07/16.   Carotid doppler performed then again revealed known right ICA occlusion with patent left carotid endarterectomy site, not significantly changed compared to last exam.  Carotid doppler from 05/23/19 showed recanalization of the right CCA, ECA and retrograde via ECA recanalization of the proximal right ICA.  Patent left carotid endarterectomy site with no evidence of restenosis.  Bilateral vertebral arteries with antegrade flow.  Carotid ultrasound from 05/23/2020 showed slight recanalization of the CCA, ECA and proximal ICA and 1-39% stenosis of left ICA.  06/02/2022 CAROTID US :  right ICA occlusion, 1-39% left ICA stenosis   Neuropathy:   He reports numbness on the bottom of his feet, mostly his toes.  He also has a discomfort involving the left shin, but it is not a pain.  He says she sometimes catches his toe of his left foot on the ground and may stumble.  This has been ongoing since his stroke.  He also reports having fractured his left ankle twice.  He does report crossing his legs.  He notes muscle cramps in the legs, which bother him at night.  NCV-EMG from 04/28/16 revealed chronic symmetric sensorimotor polyneuropathy.  Neuropathy labs include:  ANA negative, Sed Rate 3, B6 43.8, SPEP/IFE/UPEP negative, B12 855, TSH 2.07, ACE 5.  PAST MEDICAL HISTORY: Past Medical History:  Diagnosis Date   Allergy    seasonal- mostly spring   Anemia 02/03/2011   ASTHMA, CHILDHOOD 03/17/2010   mostly as child   BCC (basal cell carcinoma) 12/08/2011   CAROTID ARTERY OCCLUSION, WITH INFARCTION 03/17/2010   CEREBROVASCULAR ACCIDENT 03/17/2010   partial loss of peripheral vision on left-slight improved   CHICKENPOX, HX OF 03/17/2010   Constipation 12/08/2014   Epistaxis 09/06/2013   FATIGUE 03/17/2010   Hearing loss 11/04/2010   bilateral   HYPERKALEMIA 05/27/2010   Hyperlipidemia     Hypertension    Hypothyroid 12/08/2011   Mixed hyperlipidemia 03/17/2010   Oral lesion 11/20/2014   Overweight(278.02) 07/01/2010   Peripheral neuropathy 08/18/2016   Personal history of other infectious and parasitic disease 03/17/2010   Preventative health care 08/23/2016   Skin lesion of right arm 11/20/2014   Sleep apnea    uses mouthpiece only   Thrombocytopenia 04/05/2012   TOBACCO ABUSE, HX OF 03/17/2010   Tubular adenoma of colon 06/10/2011    MEDICATIONS: Current Outpatient Medications on File Prior to Visit  Medication Sig Dispense Refill   acetaminophen  (TYLENOL ) 500 MG tablet Take 1,000 mg by mouth as needed for moderate pain or headache.      aspirin  EC 81 MG tablet Take 81 mg by mouth every evening.      chlorthalidone  (HYGROTON ) 25 MG tablet TAKE 1 TABLET BY MOUTH IN THE  MORNING 90 tablet 3   Cholecalciferol (VITAMIN D3) 50 MCG (2000 UT) TABS Take 2,000 Units by mouth daily.  levothyroxine  (SYNTHROID ) 88 MCG tablet TAKE 1 TABLET BY MOUTH DAILY  BEFORE BREAKFAST 90 tablet 3   metoprolol  succinate (TOPROL -XL) 25 MG 24 hr tablet Take 1/2 tablet by mouth daily. May take extra 1/2 tablet daily for breakthrough afib 180 tablet 1   Multiple Vitamins-Minerals (MULTIVITAMIN WITH MINERALS) tablet Take 1 tablet by mouth daily. Mega men 50+     pantoprazole  (PROTONIX ) 40 MG tablet TAKE 1 TABLET BY MOUTH DAILY 90 tablet 3   Polyethyl Glycol-Propyl Glycol (SYSTANE ULTRA OP) Place 1 drop into both eyes in the morning.     ramipril  (ALTACE ) 10 MG capsule TAKE 1 CAPSULE BY MOUTH TWICE  DAILY 180 capsule 3   rosuvastatin  (CRESTOR ) 40 MG tablet TAKE 1 TABLET BY MOUTH IN THE  EVENING 90 tablet 3   TART CHERRY PO Take 300 mg by mouth daily.     Tenapanor HCl (IBSRELA ) 50 MG TABS Take 50 mg by mouth 2 (two) times daily. 180 tablet 3   vitamin B-12 (CYANOCOBALAMIN ) 1000 MCG tablet Take 2,000 mcg by mouth every Monday, Wednesday, and Friday.     XARELTO  20 MG TABS tablet TAKE 1 TABLET BY MOUTH DAILY   WITH SUPPER 90 tablet 1   No current facility-administered medications on file prior to visit.    ALLERGIES: No Known Allergies  FAMILY HISTORY: Family History  Problem Relation Age of Onset   Alzheimer's disease Mother    Emphysema Sister        cigarettes   Cancer Sister        lung, tobacco    Heart disease Son    Coronary artery disease Brother    Heart attack Brother    Other Brother        heart problems- from fathers side   Cancer Brother 59       lung? smoker   Heart disease Brother 34       Heart disease before age 61   Cancer Sister        gyn   Heart disease Sister    Bipolar disorder Daughter    Obesity Daughter    Stroke Father    Heart disease Sister    Esophageal cancer Neg Hx    Colon cancer Neg Hx    Pancreatic cancer Neg Hx    Stomach cancer Neg Hx       Objective:  Blood pressure (!) 153/75, pulse 64, height 6' 2 (1.88 m), weight 207 lb 12.8 oz (94.3 kg), SpO2 100%. General: No acute distress.  Patient appears well-groomed.   Head:  Normocephalic/atraumatic Eyes:  Fundi examined but not visualized Neck: supple, no paraspinal tenderness, full range of motion Heart:  Regular rate and rhythm Neurological Exam: alert and oriented.  Speech fluent and not dysarthric, language intact.  Left homonymous hemianopsia.  Otherwise, CN II-XII intact. Bulk and tone normal, muscle strength 5/5 throughout.  Sensation to light touch intact.  Deep tendon reflexes 2+ throughout.  Finger to nose testing intact.  Gait normal, Romberg negative.    Juliene Dunnings, DO  CC: Harlene Horton, MD      "

## 2024-08-16 ENCOUNTER — Encounter: Payer: Self-pay | Admitting: Neurology

## 2024-08-16 ENCOUNTER — Ambulatory Visit: Admitting: Neurology

## 2024-08-16 VITALS — BP 153/75 | HR 64 | Ht 74.0 in | Wt 207.8 lb

## 2024-08-16 DIAGNOSIS — G629 Polyneuropathy, unspecified: Secondary | ICD-10-CM

## 2024-08-16 DIAGNOSIS — R42 Dizziness and giddiness: Secondary | ICD-10-CM

## 2024-08-16 DIAGNOSIS — I63231 Cerebral infarction due to unspecified occlusion or stenosis of right carotid arteries: Secondary | ICD-10-CM | POA: Diagnosis not present

## 2024-08-16 NOTE — Patient Instructions (Signed)
 Refer to physical therapy for vestibular rehab

## 2024-08-18 ENCOUNTER — Telehealth: Payer: Self-pay

## 2024-08-18 NOTE — Telephone Encounter (Signed)
 Pt already has appointment schedule to [facility] on 11/13/24

## 2024-08-29 ENCOUNTER — Ambulatory Visit: Admitting: Physical Therapy

## 2024-09-26 ENCOUNTER — Ambulatory Visit: Admitting: Family Medicine

## 2024-09-28 ENCOUNTER — Ambulatory Visit

## 2024-10-05 ENCOUNTER — Ambulatory Visit: Admitting: Family Medicine

## 2024-11-13 ENCOUNTER — Encounter: Admitting: Family Medicine

## 2024-11-27 ENCOUNTER — Ambulatory Visit: Admitting: Neurology

## 2025-08-21 ENCOUNTER — Ambulatory Visit: Payer: Self-pay | Admitting: Neurology
